# Patient Record
Sex: Female | Born: 1960
Health system: Southern US, Community
[De-identification: ages and names within clinical notes are randomized; demographics above are authoritative.]

## PROBLEM LIST (undated history)

## (undated) DIAGNOSIS — Z9484 Stem cells transplant status: Secondary | ICD-10-CM

## (undated) DIAGNOSIS — K5790 Diverticulosis of intestine, part unspecified, without perforation or abscess without bleeding: Secondary | ICD-10-CM

## (undated) DIAGNOSIS — I499 Cardiac arrhythmia, unspecified: Secondary | ICD-10-CM

## (undated) DIAGNOSIS — K219 Gastro-esophageal reflux disease without esophagitis: Secondary | ICD-10-CM

## (undated) DIAGNOSIS — E785 Hyperlipidemia, unspecified: Secondary | ICD-10-CM

## (undated) DIAGNOSIS — IMO0002 Reserved for concepts with insufficient information to code with codable children: Secondary | ICD-10-CM

## (undated) DIAGNOSIS — J45909 Unspecified asthma, uncomplicated: Secondary | ICD-10-CM

## (undated) DIAGNOSIS — M792 Neuralgia and neuritis, unspecified: Secondary | ICD-10-CM

## (undated) DIAGNOSIS — C801 Malignant (primary) neoplasm, unspecified: Secondary | ICD-10-CM

## (undated) DIAGNOSIS — Z87891 Personal history of nicotine dependence: Secondary | ICD-10-CM

## (undated) DIAGNOSIS — C8590 Non-Hodgkin lymphoma, unspecified, unspecified site: Secondary | ICD-10-CM

## (undated) DIAGNOSIS — J209 Acute bronchitis, unspecified: Secondary | ICD-10-CM

## (undated) DIAGNOSIS — R4020 Unspecified coma: Secondary | ICD-10-CM

## (undated) DIAGNOSIS — E119 Type 2 diabetes mellitus without complications: Secondary | ICD-10-CM

## (undated) DIAGNOSIS — K635 Polyp of colon: Secondary | ICD-10-CM

## (undated) DIAGNOSIS — J302 Other seasonal allergic rhinitis: Secondary | ICD-10-CM

## (undated) HISTORY — PX: PARTIAL HYSTERECTOMY: SHX80

## (undated) HISTORY — DX: Type 2 diabetes mellitus without complications: E11.9

## (undated) HISTORY — DX: Gastro-esophageal reflux disease without esophagitis: K21.9

## (undated) HISTORY — DX: Unspecified asthma, uncomplicated: J45.909

## (undated) HISTORY — PX: VAGINAL HYSTERECTOMY: SUR661

## (undated) HISTORY — DX: Hyperlipidemia, unspecified: E78.5

## (undated) HISTORY — PX: OTHER SURGICAL HISTORY: SHX169

---

## 2002-03-09 ENCOUNTER — Emergency Department (HOSPITAL_COMMUNITY): Admission: EM | Admit: 2002-03-09 | Discharge: 2002-03-09 | Payer: Self-pay

## 2002-03-21 ENCOUNTER — Ambulatory Visit (HOSPITAL_COMMUNITY): Admission: RE | Admit: 2002-03-21 | Discharge: 2002-03-21 | Payer: Self-pay | Admitting: Podiatry

## 2003-10-30 ENCOUNTER — Ambulatory Visit (HOSPITAL_COMMUNITY): Admission: RE | Admit: 2003-10-30 | Discharge: 2003-10-30 | Payer: Self-pay | Admitting: Internal Medicine

## 2004-02-11 ENCOUNTER — Emergency Department (HOSPITAL_COMMUNITY): Admission: EM | Admit: 2004-02-11 | Discharge: 2004-02-11 | Payer: Self-pay | Admitting: Emergency Medicine

## 2004-11-01 ENCOUNTER — Ambulatory Visit (HOSPITAL_COMMUNITY): Admission: RE | Admit: 2004-11-01 | Discharge: 2004-11-01 | Payer: Self-pay | Admitting: Internal Medicine

## 2005-02-05 ENCOUNTER — Emergency Department (HOSPITAL_COMMUNITY): Admission: EM | Admit: 2005-02-05 | Discharge: 2005-02-05 | Payer: Self-pay | Admitting: Emergency Medicine

## 2005-07-16 ENCOUNTER — Ambulatory Visit (HOSPITAL_COMMUNITY): Admission: RE | Admit: 2005-07-16 | Discharge: 2005-07-16 | Payer: Self-pay | Admitting: Internal Medicine

## 2006-10-16 ENCOUNTER — Ambulatory Visit (HOSPITAL_COMMUNITY): Admission: RE | Admit: 2006-10-16 | Discharge: 2006-10-16 | Payer: Self-pay | Admitting: Internal Medicine

## 2007-10-29 ENCOUNTER — Ambulatory Visit (HOSPITAL_COMMUNITY): Admission: RE | Admit: 2007-10-29 | Discharge: 2007-10-29 | Payer: Self-pay | Admitting: Internal Medicine

## 2008-11-28 ENCOUNTER — Ambulatory Visit (HOSPITAL_COMMUNITY): Admission: RE | Admit: 2008-11-28 | Discharge: 2008-11-28 | Payer: Self-pay | Admitting: Internal Medicine

## 2009-01-07 ENCOUNTER — Emergency Department (HOSPITAL_COMMUNITY): Admission: EM | Admit: 2009-01-07 | Discharge: 2009-01-08 | Payer: Self-pay | Admitting: Emergency Medicine

## 2009-07-20 ENCOUNTER — Emergency Department (HOSPITAL_COMMUNITY): Admission: EM | Admit: 2009-07-20 | Discharge: 2009-07-20 | Payer: Self-pay | Admitting: Emergency Medicine

## 2010-07-03 ENCOUNTER — Emergency Department (HOSPITAL_COMMUNITY): Admission: EM | Admit: 2010-07-03 | Discharge: 2010-07-03 | Payer: Self-pay | Admitting: Emergency Medicine

## 2010-11-04 ENCOUNTER — Encounter: Payer: Self-pay | Admitting: Orthopedic Surgery

## 2011-01-14 NOTE — Letter (Signed)
Summary: [facility] Referral No Show  [facility]  [address]  [place], [place] [address]   Phone: [phone]  Fax: [phone]    11/04/10  Dr. [name] [address] [place], [place]  [address]  Re:    Megan Mcknight DOB:    02/27/1961    The above named patient was a no show for the scheduled appointment:  Date:   10/31/2010 Time:     9:15 am  Thank you for your kind referral.  Please feel free to contact our office if we can be of further service.  Sincerely,   [facility] Office of Dr. Terrance Mass, MD

## 2011-02-15 ENCOUNTER — Emergency Department (HOSPITAL_COMMUNITY): Payer: PRIVATE HEALTH INSURANCE

## 2011-02-15 ENCOUNTER — Emergency Department (HOSPITAL_COMMUNITY)
Admission: EM | Admit: 2011-02-15 | Discharge: 2011-02-15 | Disposition: A | Discharge status: Home / Self Care | Payer: PRIVATE HEALTH INSURANCE | Attending: Emergency Medicine | Admitting: Emergency Medicine

## 2011-02-15 ENCOUNTER — Encounter: Payer: Self-pay | Admitting: Orthopedic Surgery

## 2011-02-15 DIAGNOSIS — M25579 Pain in unspecified ankle and joints of unspecified foot: Secondary | ICD-10-CM | POA: Insufficient documentation

## 2011-02-15 DIAGNOSIS — Y9269 Other specified industrial and construction area as the place of occurrence of the external cause: Secondary | ICD-10-CM | POA: Insufficient documentation

## 2011-02-15 DIAGNOSIS — IMO0002 Reserved for concepts with insufficient information to code with codable children: Secondary | ICD-10-CM | POA: Insufficient documentation

## 2011-02-15 DIAGNOSIS — S99929A Unspecified injury of unspecified foot, initial encounter: Secondary | ICD-10-CM | POA: Insufficient documentation

## 2011-02-15 DIAGNOSIS — S8990XA Unspecified injury of unspecified lower leg, initial encounter: Secondary | ICD-10-CM | POA: Insufficient documentation

## 2011-02-15 DIAGNOSIS — S99919A Unspecified injury of unspecified ankle, initial encounter: Secondary | ICD-10-CM | POA: Insufficient documentation

## 2011-02-24 ENCOUNTER — Encounter: Payer: Self-pay | Admitting: Orthopedic Surgery

## 2011-02-27 ENCOUNTER — Ambulatory Visit (INDEPENDENT_AMBULATORY_CARE_PROVIDER_SITE_OTHER): Payer: Worker's Compensation | Admitting: Orthopedic Surgery

## 2011-02-27 ENCOUNTER — Encounter: Payer: Self-pay | Admitting: Orthopedic Surgery

## 2011-02-27 DIAGNOSIS — S9030XA Contusion of unspecified foot, initial encounter: Secondary | ICD-10-CM

## 2011-02-27 DIAGNOSIS — S8010XA Contusion of unspecified lower leg, initial encounter: Secondary | ICD-10-CM

## 2011-03-04 NOTE — Assessment & Plan Note (Signed)
Summary: FOOT INJURY/POSS.NEW XRAY/HAD XRAY [facility] 02/15/11/REF T.[name]/W.C...   Visit Type:  new patient workers comp Referring Provider:  workers comp Primary Provider:  Dr. Felecia Shelling  CC:  left foot pain and lower leg.  History of Present Illness: I saw Megan Mcknight in the office today for an initial visit.  She is a 50 years old woman with the complaint of:  left foot pain and lower leg pain.  Workers Comp injury.  DOI 02/15/11 was at work, [employer].  Xrays [facility] 02/15/11 left foot.  Meds: Lipitor, Prevacid.  The patient works for [employer]  She's been working since her injury on February 15, 2011.  She was doing her normal job when a machine ran over her foot.  Since that time she's been in a postop shoe complaining of pain and swelling of her foot and lower leg.  She has been prescribed Naprosyn which she will pick up today.  She is complaining of a sharp burning pain.  Intensity 9/10.  Timing constant.  Improved by elevating her foot worsened by lowering the foot.  Associated signs include bruising swelling and symptoms of numbness and tingling of the foot.    Allergies (verified): 1)  ! 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 Cardiovascular:  Denies chest pain, palpitations, fainting, and murmurs. Respiratory:  Complains of snoring; denies short of breath, wheezing, couch, tightness, pain on inspiration, and snoring . Gastrointestinal:  Denies heartburn, nausea, vomiting, diarrhea, constipation, and blood in your stools. Genitourinary:  Denies  frequency, urgency, difficulty urinating, painful urination, flank pain, and bleeding in urine. Neurologic:  Complains of numbness and tingling; denies unsteady gait, dizziness, tremors, and seizure. Musculoskeletal:  Denies joint pain, swelling, instability, stiffness, redness, heat, and muscle pain. Endocrine:  Denies excessive thirst, exessive urination, and heat or cold intolerance. Psychiatric:  Denies nervousness, depression, anxiety, and hallucinations. Skin:  Denies changes in the skin, poor healing, rash, itching, and redness. HEENT:  Denies blurred or double vision, eye pain, redness, and watering. Immunology:  Complains of seasonal allergies; denies sinus problems and allergic to bee stings. Hemoatologic:  Denies easy bleeding and brusing.  Physical Exam  Additional Exam:  the vital signs are previously been recorded and are stable  She is well-developed and well-nourished her grooming and hygiene are normal.  She is oriented x3 her mood and affect are pleasant.  She is ambulating with a postop shoe with a noticeable limp favoring the LEFT lower extremity.  Upon inspection her foot is swollen in the distal third of her leg is swollen and there is an abrasion over the anterior part of the leg which is closed with no signs of infection.  Her range of motion at the ankle joint is approximately 15 of plantar flexion and 10 of dorsiflexion which is difficult.  The ankle joint is stable.  There is some weakness in plantarflexion dorsiflexion inversion and eversion but this is secondary to pain.  She is also tender in the anterior compartment of the tibia.  The compartments however are soft in the foot and the leg.  Skin is been described  She has  a normal pulse dorsalis pedis and posterior tib, temperature is normal.  There is swelling and edema in the leg.  Sensation is normal reflexes were deferred coordination and balance were normal.   Impression & Recommendations:  Problem # 1:   CONTUSION, LEFT FOOT (ICD-924.20) Assessment New  plan   long Cam Walker and Compression hose to control edema Self-directed home exercise program plantarflexion dorsiflexion 3 times a day 25 repetitions patient already instructed Walker to unload the foot  The patient can continue working if she can be accommodated for 100% sitting job she will followup on the 29th.  Orders: New Patient Level III (28413)  Problem # 2:  CONTUSION, LOWER LEG, LEFT (ICD-924.10) Assessment: New  Orders: New Patient Level III (24401)  Patient Instructions: 1)  RTW 100% SITTING  2)  WEAR WALKER TO WORK AND USE WALKER  3)  RETURN IN 2 WEEKS    Orders Added: 1)  New Patient Level III [02725]  Appended Document: FOOT INJURY/POSS.NEW XRAY/HAD XRAY [facility] 02/15/11/REF T.[name]/W.C..Marland Kitchen radiographs were done at the hospital here in town.  X-ray of the foot no evidence of fracture or dislocation.

## 2011-03-04 NOTE — Letter (Signed)
Summary: Out of Work  [facility]  [address]  [place], [place] [address]   Phone: [phone]  Fax: [phone]    February 27, 2011   Employee:  KRISTALYN BERGSTRESSER    To Whom It May Concern:   For Medical reasons, the above named patient may return to work 02/27/11 with the following restrictions:  must be sitting 100% of time until after her next appointment scheduled for  03/25/11.  If you need further information please contact our office.    Sincerely,      Dr. Vickki Hearing

## 2011-03-04 NOTE — Letter (Signed)
Summary: *Orthopedic Consult Note  [facility]  [address]  [place], [place] [address]   Phone: [phone]  Fax: [phone]    Re:    ASTER SCREWS DOB:    1961/06/17   Dear: Dr Felecia Shelling   Thank you for requesting that we see the above patient for consultation.  A copy of the detailed office note will be sent under separate cover, for your review.  Evaluation today is consistent with: contusion foot and leg  Recommendation for ambulatory Cam Walker, Walker, compression stockings, continue Naprosyn, continue working in a sit down position.  Followup 2 weeks    Thank you for this opportunity to look after your patient.  Sincerely,   Terrance Mass. MD.

## 2011-03-13 NOTE — Letter (Signed)
Summary: History form  History form   Imported By: Cammie Sickle 03/03/2011 22:10:59  _____________________________________________________________________  External Attachment:    Type:   Image     Comment:   External Document

## 2011-03-25 ENCOUNTER — Ambulatory Visit (INDEPENDENT_AMBULATORY_CARE_PROVIDER_SITE_OTHER): Payer: Worker's Compensation | Admitting: Orthopedic Surgery

## 2011-03-25 ENCOUNTER — Encounter: Payer: Self-pay | Admitting: Orthopedic Surgery

## 2011-03-25 DIAGNOSIS — S9030XA Contusion of unspecified foot, initial encounter: Secondary | ICD-10-CM

## 2011-03-25 NOTE — Patient Instructions (Signed)
Continue same treatment course and work status x 4 weeks

## 2011-03-25 NOTE — Progress Notes (Signed)
Followup status post deep contusion, LEFT foot.  Workers comp injury date 02/15/2011  Treatment compression stocking, protected weightbearing, and long Cam Walker, self-directed home exercise program. Sitting at work. She indicates that work has been complying with her sitting status.  She complains of numbness from the top of the foot to the distal part of her toes x5.  Her range of motion ankle has improved. She is still tender over the dorsum of the foot. Her swelling has decreased approximately 75% gross estimate.  Plan continue treatment plan x4 weeks followup 4 weeks. Continue work status. Same continue Cam Walker.  I expect resolution of the numbness over 6-9 months.

## 2011-03-31 ENCOUNTER — Encounter: Payer: Self-pay | Admitting: Orthopedic Surgery

## 2011-04-22 ENCOUNTER — Ambulatory Visit (INDEPENDENT_AMBULATORY_CARE_PROVIDER_SITE_OTHER): Payer: Worker's Compensation | Admitting: Orthopedic Surgery

## 2011-04-22 ENCOUNTER — Encounter: Payer: Self-pay | Admitting: Orthopedic Surgery

## 2011-04-22 DIAGNOSIS — S9030XA Contusion of unspecified foot, initial encounter: Secondary | ICD-10-CM

## 2011-04-22 NOTE — Patient Instructions (Signed)
2 hours of standing / 8 hour shift in the brace  And it will be 15 minutes consecutive time every hour  And continue the remaining time sitting   We will do this for 2 weeks and then increase the time to 30 min consecutive time for total of 4 hours / 8 hours for 2 more weeks then a follow up appointment with the doctor

## 2011-04-22 NOTE — Progress Notes (Signed)
This is a followup visit.  Worker's compensation injury.  Diagnosis contusion of the LEFT foot, secondary to heavy object falling on the LEFT foot.  No fracture was seen however, patient had significant swelling.  Complains of numbness and tingling in the dorsum of the foot and continued dorsal foot pain.  Date of injury was March 3 of this year.  Exam shows she is walking with a long Cam Walker. There is tenderness on the dorsum of the foot with decreased sensation in the middle digits. The tissue is firm and indurated. No sign of infection. Most likely secondary to the previous subcutaneous and muscular hematoma.  Recommended gradual return to standing at work starting with 15 minutes each hour. Can progress to 30 minutes each hour. We will start with 15 minutes per hour. The 1st 2 weeks and increased to 30 minutes per hour. The 2nd 2 weeks. She should continue her cam walker.  Followup in 4 weeks

## 2011-05-02 NOTE — H&P (Signed)
[facility]  Patient:    Megan Mcknight, Megan Mcknight Visit Number: [id number] MRN: [id number]          Service Type: EMS Location: ED Attending Physician:  Pearletha Alfred Dictated by:   Oley Balm Pricilla Holm, D.P.M. Admit Date:  03/09/2002 Discharge Date: 03/09/2002                           History and Physical  HISTORY OF PRESENT ILLNESS:  Ms. Megan Mcknight is a 50 year old black female who presents to the office with a chief complaint painful fifth toe of the left foot.  The patient has a recurrent lesion that developed on the dorsolateral aspect of the fifth toe.  She has tried to wear larger shoes, accommodative shoes, without significant relief from symptomatology and requests surgical correction of same.  PREVIOUS HOSPITALIZATIONS:  Childbirth x3, hysterectomy, previous foot surgery.  MEDICATIONS:  She takes Prevacid and Flonase and Lasix.  ALLERGIES:  She relates allergies to IODINE.  FAMILY HISTORY:  Heart disease.  TRANSFUSIONS:  No transfusion or hepatitis.  REVIEW OF SYSTEMS:  Uneventful.  PHYSICAL EXAMINATION:  EXTREMITIES:  Lower extremity exam reveals palpable pedal pulses, both DP and PT, with spontaneous capillary filling time.  NEUROLOGIC:  Essentially within normal limits.  MUSCULOSKELETAL:  Exam reveals abductovarus rotation of fifth toe of the left foot with associated digital lesion.  ASSESSMENT:  Abductovarus rotation, fifth toe, left foot, with a painful lesion.  PLAN:  The patient reviewed derotation arthroplasty.  We reviewed the procedure with the patient including complications of the procedure such as infection, bone infection, postoperative pain, swelling, etc.  The patient seems to understand the same and surgery has been scheduled for March 21, 2002. Dictated by:   Oley Balm Pricilla Holm, D.P.M. Attending Physician:  Susy Manor B DD:  03/20/02 TD:  03/21/02 Job: [id number]

## 2011-05-02 NOTE — Op Note (Signed)
[facility]  Patient:    NYKIA, TURKO Visit Number: [id number] MRN: [id number]          Service Type: DSU Location: DAY Attending Physician:  Abran Richard Dictated by:   Oley Balm Pricilla Holm, D.P.M. Proc. Date: 03/21/02 Admit Date:  03/21/2002                             Operative Report  PREOPERATIVE DIAGNOSIS:  Abductor varus rotation with hyperostosis, fifth toe, left foot.  POSTOPERATIVE DIAGNOSIS:  Abductor varus rotation with hyperostosis, fifth toe, left foot.  OPERATION:  Revision arthroplasty, fifth toe, left foot.  SURGEON:  Oley Balm. Pricilla Holm, D.P.M.  ANESTHESIA:  Local with standby  INDICATIONS:  Longstanding history of pain unrelieved by conservative care.  DESCRIPTION OF PROCEDURE:  The patient was brought to the operating room and placed on the operating table in supine position.  The patients lower left foot and leg were then prepped and draped in usual aseptic manner.  Ankle tourniquet placed, well padded to prevent contusion, inflated 250 mmHg.  After exsanguination of the left foot, the following surgical procedure was then performed under local with standby anesthesia.  REVISIONAL ARTHROPLASTY, FIFTH TOE, LEFT FOOT  Attention was directed to the dorsal lateral aspect of the fifth toe of the left foot where a 2 cm elliptical skin incision was made.  The incision was widened and deepened via sharp dissection making sure to identify and retract all vital structures.  A capsular incision was then made.  It was noted that it was very fibrotic over the junction over the proximal and distal phalanx.  A high parostosis was noted laterally which was freed from soft tissue attachments and resected utilizing a Zimmer oscillating saw.  All rough edges were smooth utilizing sterile saline.  The tendon approximated utilizing horizontal mattress suture of 4-0 Dexon.  The skin was approximated utilizing horizontal mattress sutures of 4-0  Prolene.  All surgical sites were then infiltrated with approximately 18 cc of ______ phosphate.  Then mild compressive bandage consisting of Betadine-soaked Adaptic, sterile 4 x 4s, and sterile cling were applied.  The patient tolerated the procedure well and left the operating room in apparently good condition.  Vital signs were stable in the recovery room. Dictated by:   Oley Balm Pricilla Holm, D.P.M. Attending Physician:  Abran Richard DD:  03/21/02 TD:  03/21/02 Job: [id number]

## 2011-05-21 ENCOUNTER — Encounter: Payer: Self-pay | Admitting: Orthopedic Surgery

## 2011-05-21 ENCOUNTER — Ambulatory Visit (INDEPENDENT_AMBULATORY_CARE_PROVIDER_SITE_OTHER): Payer: Worker's Compensation | Admitting: Orthopedic Surgery

## 2011-05-21 DIAGNOSIS — S9030XA Contusion of unspecified foot, initial encounter: Secondary | ICD-10-CM

## 2011-05-21 NOTE — Progress Notes (Signed)
Visit workers compensation injury LEFT foot contusion  Patient still has numbness in the foot the 4 lesser digits.  She still has pain on the top of the foot.  She has good pain relief in the [employer].  She is starting to have some pain running up the side of the leg which I believe is secondary to the gait alteration caused by the leg length discrepancy and wearing the Cam Walker.  She tolerated the work schedule as stated previously.  Exam shows he is numb over the lesser digits of the foot there is induration over the area of the contusion and there is tenderness in that area.  Her ankle range of motion is normal  Impression contusion LEFT foot  Plan switch to orthotics and change work schedule him to include one hour standing one-hour sitting alternate over to a weeks time and then come back for recheck and advance plan as tolerated at that point  I expect him MMI at one month from now

## 2011-05-21 NOTE — Patient Instructions (Signed)
[facility] orthotics Warm N Form   Try 1 hr stand 1 hr sit   In 8 hr work day x next 2 weeks   2 weeks come back

## 2011-06-05 ENCOUNTER — Encounter: Payer: Self-pay | Admitting: Orthopedic Surgery

## 2011-06-05 ENCOUNTER — Ambulatory Visit (INDEPENDENT_AMBULATORY_CARE_PROVIDER_SITE_OTHER): Payer: Worker's Compensation | Admitting: Orthopedic Surgery

## 2011-06-05 DIAGNOSIS — S9030XA Contusion of unspecified foot, initial encounter: Secondary | ICD-10-CM

## 2011-06-05 NOTE — Progress Notes (Signed)
Previous history  Visit workers compensation injury LEFT foot contusion  Patient still has numbness in the foot the 4 lesser digits. She still has pain on the top of the foot. She has good pain relief in the [employer]. She is starting to have some pain running up the side of the leg which I believe is secondary to the gait alteration caused by the leg length discrepancy and wearing the Cam Walker. She tolerated the work schedule as stated previously.    Today he is doing well with her new work restrictions I think we can advance him to 4 hours on 4 hours off with regular foot orthotics.  On her next visit we should be able to resume normal 8 hours on her feet during a shift.

## 2011-06-05 NOTE — Patient Instructions (Signed)
Wear orthotics   Change to 4 hours on 4 hours off feet 8 hour shift

## 2011-06-12 ENCOUNTER — Encounter: Payer: Self-pay | Admitting: Orthopedic Surgery

## 2011-06-12 ENCOUNTER — Ambulatory Visit (INDEPENDENT_AMBULATORY_CARE_PROVIDER_SITE_OTHER): Payer: Worker's Compensation | Admitting: Orthopedic Surgery

## 2011-06-12 DIAGNOSIS — S8010XA Contusion of unspecified lower leg, initial encounter: Secondary | ICD-10-CM

## 2011-06-12 DIAGNOSIS — S9030XA Contusion of unspecified foot, initial encounter: Secondary | ICD-10-CM

## 2011-06-12 NOTE — Progress Notes (Signed)
   The patient reports some pain with her new ambulatory regiment but she is progressing well I would like to keep her at this level so that we don't have a relapse.  Continue for 3 more weeks with 4 hours on 4 hours off over an 8 hour period sitting as needed which seems to be working well

## 2011-06-12 NOTE — Patient Instructions (Signed)
4 hours on 4 hours off in 8 hour shift sit as needed  with regular foot orthotics.

## 2011-06-24 ENCOUNTER — Telehealth: Payer: Self-pay | Admitting: Orthopedic Surgery

## 2011-06-24 NOTE — Telephone Encounter (Signed)
[employer] * ATTNFreddrick March [phone]    [facility] [phone]   06/24/11  06/24/11  Faxed last office notes (DOS 06/12/11) to [employer] (Workers Comp) as per request; reference/claim # [id number].  Authorization on file.

## 2011-07-09 ENCOUNTER — Encounter: Payer: Self-pay | Admitting: Orthopedic Surgery

## 2011-07-09 ENCOUNTER — Ambulatory Visit (INDEPENDENT_AMBULATORY_CARE_PROVIDER_SITE_OTHER): Payer: Worker's Compensation | Admitting: Orthopedic Surgery

## 2011-07-09 DIAGNOSIS — S9030XA Contusion of unspecified foot, initial encounter: Secondary | ICD-10-CM

## 2011-07-09 NOTE — Progress Notes (Signed)
Status post crush injury, LEFT foot, with nerve damage.  Still has numbness and tingling in the lesser digits, and great toe. The LEFT foot, with tenderness, and skin discoloration over the dorsum. Swelling is being controlled with a compression stocking. She is advancing very well in terms of walking and ambulating now with regular shoes and an orthotic. I think we can advance her to an 8 hour day with a 15 anesthetic. After standing one hour continuous and continue with orthotics, but no ladders, or climbing.  Followup 4 weeks

## 2011-07-09 NOTE — Patient Instructions (Signed)
Allowed to stand 8 hours as follows  Stand 1 hour consecutive followed by 15 min break   For the next 4 weeks  No climbing ladders

## 2011-08-06 ENCOUNTER — Ambulatory Visit (INDEPENDENT_AMBULATORY_CARE_PROVIDER_SITE_OTHER): Payer: Worker's Compensation | Admitting: Orthopedic Surgery

## 2011-08-06 ENCOUNTER — Encounter: Payer: Self-pay | Admitting: Orthopedic Surgery

## 2011-08-06 DIAGNOSIS — S8010XA Contusion of unspecified lower leg, initial encounter: Secondary | ICD-10-CM

## 2011-08-06 DIAGNOSIS — R209 Unspecified disturbances of skin sensation: Secondary | ICD-10-CM

## 2011-08-06 DIAGNOSIS — R208 Other disturbances of skin sensation: Secondary | ICD-10-CM

## 2011-08-06 DIAGNOSIS — S9030XA Contusion of unspecified foot, initial encounter: Secondary | ICD-10-CM

## 2011-08-06 NOTE — Progress Notes (Signed)
Status post crush injury, LEFT foot, with nerve damage.   Still has numbness and tingling in the lesser digits, and great toe. The LEFT foot, with tenderness, and skin discoloration over the dorsum. Swelling is being controlled with a compression stocking. She is advancing very well in terms of walking and ambulating now with regular shoes and an orthotic. I think we can advance her to an 8 hour day     Allowed to stand 8 hours as follows  Stand 1 hour consecutive followed by 15 min break  For the next 4 weeks  No climbing ladders  Complains of dysesthesias in the foot.  The patient will have a permanent partial impairment of her LEFT foot secondary to the nerve damage.  Recommend a 5% partial permanent impairment of the LEFT foot.  Advance weightbearing status as follows:  Stand 2 hours consecutive followed by 15 minute break for the next 4 weeks, then  Stand 3 hours consecutive followed by 15 minute break for weeks 5-8, then resume all normal activities.  Allow ladders in 4 weeks.  I believe the patient will have reached her maximal medical improvement in 8 weeks.  She will followup in 8 weeks to confirm the partial permanent impairment.

## 2011-08-06 NOTE — Patient Instructions (Signed)
Advance weightbearing status as follows:  Stand 2 hours consecutive followed by 15 minute break for the next 4 weeks, then  Stand 3 hours consecutive followed by 15 minute break for weeks 5-8, then resume all normal activities.  Allow ladders in 4 weeks.

## 2011-08-07 ENCOUNTER — Telehealth: Payer: Self-pay | Admitting: Orthopedic Surgery

## 2011-08-07 NOTE — Telephone Encounter (Signed)
Faxed office note to [employer], attention Crista Luria, to fax [phone], ph# [phone]. Authorization on file

## 2011-09-09 ENCOUNTER — Other Ambulatory Visit (HOSPITAL_COMMUNITY): Payer: Self-pay | Admitting: Internal Medicine

## 2011-09-09 DIAGNOSIS — Z139 Encounter for screening, unspecified: Secondary | ICD-10-CM

## 2011-09-15 ENCOUNTER — Ambulatory Visit (HOSPITAL_COMMUNITY)
Admission: RE | Admit: 2011-09-15 | Discharge: 2011-09-15 | Disposition: A | Discharge status: Home / Self Care | Payer: PRIVATE HEALTH INSURANCE | Source: Ambulatory Visit | Attending: Internal Medicine | Admitting: Internal Medicine

## 2011-09-15 DIAGNOSIS — Z1231 Encounter for screening mammogram for malignant neoplasm of breast: Secondary | ICD-10-CM | POA: Insufficient documentation

## 2011-09-15 DIAGNOSIS — Z139 Encounter for screening, unspecified: Secondary | ICD-10-CM

## 2011-10-01 ENCOUNTER — Ambulatory Visit: Payer: Worker's Compensation | Admitting: Orthopedic Surgery

## 2011-10-02 ENCOUNTER — Encounter: Payer: Self-pay | Admitting: Orthopedic Surgery

## 2011-10-02 ENCOUNTER — Ambulatory Visit (INDEPENDENT_AMBULATORY_CARE_PROVIDER_SITE_OTHER): Payer: Worker's Compensation | Admitting: Orthopedic Surgery

## 2011-10-02 VITALS — Ht 68.0 in | Wt 231.0 lb

## 2011-10-02 DIAGNOSIS — G5790 Unspecified mononeuropathy of unspecified lower limb: Secondary | ICD-10-CM

## 2011-10-02 DIAGNOSIS — S9032XA Contusion of left foot, initial encounter: Secondary | ICD-10-CM

## 2011-10-02 DIAGNOSIS — S9030XA Contusion of unspecified foot, initial encounter: Secondary | ICD-10-CM

## 2011-10-02 MED ORDER — GABAPENTIN 100 MG PO CAPS: 100.0000 mg | ORAL_CAPSULE | Freq: Every day | ORAL | Status: DC

## 2011-10-02 NOTE — Progress Notes (Signed)
  DOI 02/15/11:  Workers compensation injury: Crush injury to the left foot  Diagnoses: Contusion left foot  Diagnosis: Mononeuritis left foot  Chief complaint paresthesias in the left foot dorsally and plantar under the metatarsal heads. Continued pain on the dorsum of the left foot. Giving out of the left foot. Difficulty standing single leg stance left foot. The patient complains of inability uterine and exercise as she did prior to injury.  She feels that she can continue to work if she gets more breaks during the day.  Although its difficult to assess the exact amount of impairment, a reasonable amount of impairment based on the pain and continued paresthesias in the left foot in my medical opinion would be approximately 10%  Our permanent partial impairment rating is therefore: PPI 10% left foot Based on continued pain dorsum of the foot with probable nerve injury from crush mechanism  This will most likely be permanent and will require intermittent medication as needed which may or may not include oral pain medication, anti-inflammatories or and Neurontin/Lyrica

## 2011-10-02 NOTE — Patient Instructions (Addendum)
Workers  Per workers Adult nurse for the state of [place] we are required to give you a permanent partial impairment if one exists.  Based on your pain and abnormal sensation in the dorsum of your foot, a 10% permanent partial impairment has been assigned to your LEFT foot injury.  This is most likely a permanent impairment.   work restrictions have been assigned : (6)20 minute breaks per 8 hour shift   will need permanent TED hose with renewals as needed     recommend custom foot orthotic (arrange with the podiatrist of your choice)    I would like for you to try neurontin 100 mg at night to help with the numbness

## 2011-10-03 ENCOUNTER — Encounter: Payer: Self-pay | Admitting: Orthopedic Surgery

## 2011-10-03 DIAGNOSIS — G5790 Unspecified mononeuropathy of unspecified lower limb: Secondary | ICD-10-CM | POA: Insufficient documentation

## 2011-10-03 DIAGNOSIS — S9032XA Contusion of left foot, initial encounter: Secondary | ICD-10-CM | POA: Insufficient documentation

## 2011-10-13 ENCOUNTER — Telehealth: Payer: Self-pay | Admitting: Orthopedic Surgery

## 2011-10-13 NOTE — Telephone Encounter (Addendum)
Patient has copy + will bring in as soon as possible to be faxed as noted.

## 2011-10-13 NOTE — Telephone Encounter (Signed)
The patient has the script I believe

## 2011-10-13 NOTE — Telephone Encounter (Signed)
Call from [facility], Clydie Braun, on behalf of [employer] (workers comp.) Request Rx for custom orthotic for left foot per office visit 10/09/11. Their fax is [phone], ph# [phone].  Upon receipt of Rx, they will refer patient to approved vendor.

## 2011-10-15 NOTE — Telephone Encounter (Addendum)
10/15/11  Patient brought in an Rx however it was copy of the Rx for Ted hose/compression stockings, not brace.  Dr. Mort Sawyers note from [facility] 10/02/11 indicates custom orthotic brace with podiatrist of choice.  The calls we are receiving are from [facility], the DME service provider, requesting the custom measurements.  I contacted patient's case manager, Helmut Muster at ph# [phone] - she will follow up and take care of.  Patient aware.

## 2011-10-22 NOTE — Telephone Encounter (Signed)
I called back to [place] at [employer] workers [employer] to follow up on status of brace/orthotics. Left voice mail message.

## 2011-10-27 NOTE — Telephone Encounter (Signed)
No response, left a fol/up voice mail message + sent fax to check on status of orthotic and Ted hose.  Patient has not heard; I left her a message to let her know we are following up.

## 2011-10-29 NOTE — Telephone Encounter (Addendum)
Patient states she received call from [employer] Workers comp case Production designer, [employer]television[employer] [place] on 10/27/11 and also has heard from the orthotic vendor.  She has an appointment scheduled that workers comp set up, at [facility] in [place].  Ph# [phone], Fax [phone].

## 2011-10-30 ENCOUNTER — Telehealth: Payer: Self-pay | Admitting: Orthopedic Surgery

## 2011-10-30 NOTE — Telephone Encounter (Signed)
As noted in previous telephone note, patient has an appointment, per Workers Comp, scheduled at [facility] in [place] for Monday, 11/03/11. They will provide the Ted hose, which patient has the prescription for, and also will measure her for the orthotics. Is there an order or Rx to send to them?  Their ph# is [phone], fax [phone], contact person is Amil Amen.

## 2012-01-23 ENCOUNTER — Telehealth: Payer: Self-pay

## 2012-01-23 NOTE — Telephone Encounter (Signed)
LM for pt to call

## 2012-01-27 ENCOUNTER — Telehealth: Payer: Self-pay

## 2012-01-27 NOTE — Telephone Encounter (Signed)
Opened in error

## 2012-01-27 NOTE — Telephone Encounter (Signed)
Pt returned call. Has problems with constipation. OV with Tana Coast, PA on 02/02/2012 @ 8:00 AM.

## 2012-02-02 ENCOUNTER — Encounter: Payer: Self-pay | Admitting: Gastroenterology

## 2012-02-02 ENCOUNTER — Ambulatory Visit (INDEPENDENT_AMBULATORY_CARE_PROVIDER_SITE_OTHER): Payer: PRIVATE HEALTH INSURANCE | Admitting: Gastroenterology

## 2012-02-02 VITALS — BP 130/83 | HR 74 | Temp 98.1°F | Ht 68.0 in | Wt 230.8 lb

## 2012-02-02 DIAGNOSIS — Z8371 Family history of colonic polyps: Secondary | ICD-10-CM | POA: Insufficient documentation

## 2012-02-02 DIAGNOSIS — K5900 Constipation, unspecified: Secondary | ICD-10-CM | POA: Insufficient documentation

## 2012-02-02 DIAGNOSIS — K219 Gastro-esophageal reflux disease without esophagitis: Secondary | ICD-10-CM | POA: Insufficient documentation

## 2012-02-02 DIAGNOSIS — Z1211 Encounter for screening for malignant neoplasm of colon: Secondary | ICD-10-CM

## 2012-02-02 MED ORDER — PEG-KCL-NACL-NASULF-NA ASC-C 100 G PO SOLR: 1.0000 | Freq: Once | ORAL | Status: DC

## 2012-02-02 MED ORDER — POLYETHYLENE GLYCOL 3350 17 GM/SCOOP PO POWD: 17.0000 g | Freq: Every day | ORAL | Status: AC

## 2012-02-02 NOTE — Assessment & Plan Note (Signed)
Seldom GERD controlled with antireflux measures.

## 2012-02-02 NOTE — Assessment & Plan Note (Signed)
Chronic constipation. Add Miralax 17g daily. Patient with family history of colon polyps (mother, details unknown). No prior TCS herself.  I have discussed the risks, alternatives, benefits with regards to but not limited to the risk of reaction to medication, bleeding, infection, perforation and the patient is agreeable to proceed. Written consent to be obtained.

## 2012-02-02 NOTE — Progress Notes (Signed)
Addended by: Cherene Julian D on: 02/02/2012 09:28 AM   Modules accepted: Orders

## 2012-02-02 NOTE — Patient Instructions (Signed)
I have sent RX for Miralax to your pharmacy. Check to see if your insurance will pay for generic. They may make you buy over-the-counter. We have scheduled you for a colonoscopy. Please see separate instructions.

## 2012-02-02 NOTE — Progress Notes (Signed)
Primary Care Physician:  [name], MD, MD  Primary Gastroenterologist:  Roetta Sessions, MD   Chief Complaint  Patient presents with  . Colonoscopy    HPI:  Megan Mcknight is a 51 y.o. female here to schedule her first ever colonoscopy. Mother recently had colon polyps but patient not aware of details. Patient c/o chronic constipation. BM 2-3 per week. Takes Philips MOM pills prn.  No melena, brbpr. No abdominal pain, n/v. Some seldom heartburn. Watches diet. No dysphagia. No weight loss. Takes neurontin and naproxen for left foot pain due to work related injury. Crush injury to left foot/leg.  Current Outpatient Prescriptions  Medication Sig Dispense Refill  . atorvastatin (LIPITOR) 10 MG tablet Take 10 mg by mouth daily.        Marland Kitchen gabapentin (NEURONTIN) 100 MG capsule Take 1 capsule (100 mg total) by mouth at bedtime.  90 capsule  2  . naproxen (NAPROSYN) 500 MG tablet Take 500 mg by mouth daily.          Allergies as of 02/02/2012 - Review Complete 02/02/2012  Allergen Reaction Noted  . Iodine Anaphylaxis   . Shellfish-derived products Swelling 03/25/2011    Past Medical History  Diagnosis Date  . Hyperlipidemia     Past Surgical History  Procedure Date  . Partial hysterectomy   . Bones removed from baby toes     Family History  Problem Relation Age of Onset  . Heart defect      family history   . Asthma      family history   . Colon polyps Mother     History   Social History  . Marital Status: Divorced    Spouse Name: N/A    Number of Children: 3  . Years of Education: N/A   Occupational History  . technician    .     Social History Main Topics  . Smoking status: Current Everyday Smoker -- 0.5 packs/day    Types: Cigarettes  . Smokeless tobacco: Not on file  . Alcohol Use: Yes     one glass every three weeks  . Drug Use: No  . Sexually Active: Not on file   Other Topics Concern  . Not on file   Social History Narrative  . No narrative on  file      ROS:  General: Negative for anorexia, weight loss, fever, chills, fatigue, weakness. Eyes: Negative for vision changes.  ENT: Negative for hoarseness, difficulty swallowing , nasal congestion. CV: Negative for chest pain, angina, palpitations, dyspnea on exertion, peripheral edema.  Respiratory: Negative for dyspnea at rest, dyspnea on exertion, cough, sputum, wheezing.  GI: See history of present illness. GU:  Negative for dysuria, hematuria, urinary incontinence, urinary frequency, nocturnal urination.  MS: Left foot pain and numbness. No low back pain.  Derm: Negative for rash or itching.  Neuro: Negative for weakness, abnormal sensation, seizure, frequent headaches, memory loss, confusion.  Psych: Negative for anxiety, depression, suicidal ideation, hallucinations.  Endo: Negative for unusual weight change.  Heme: Negative for bruising or bleeding. Allergy: Negative for rash or hives.    Physical Examination:  BP 130/83  Pulse 74  Temp(Src) 98.1 F (36.7 C) (Temporal)  Ht 5\' 8"  (1.727 m)  Wt 230 lb 12.8 oz (104.69 kg)  BMI 35.09 kg/m2   General: Well-nourished, well-developed in no acute distress.  Head: Normocephalic, atraumatic.   Eyes: Conjunctiva pink, no icterus. Mouth: Oropharyngeal mucosa moist and pink , no lesions erythema or exudate.  Neck: Supple without thyromegaly, masses, or lymphadenopathy.  Lungs: Clear to auscultation bilaterally.  Heart: Regular rate and rhythm, no murmurs rubs or gallops.  Abdomen: Bowel sounds are normal, nontender, nondistended, no hepatosplenomegaly or masses, no abdominal bruits or    hernia , no rebound or guarding.   Rectal: Defer to time of colonoscopy. Extremities: No lower extremity edema. No clubbing or deformities.  Neuro: Alert and oriented x 4 , grossly normal neurologically.  Skin: Warm and dry, no rash or jaundice.   Psych: Alert and cooperative, normal mood and affect.

## 2012-02-02 NOTE — Progress Notes (Signed)
Faxed to PCP

## 2012-02-13 ENCOUNTER — Telehealth: Payer: Self-pay | Admitting: Gastroenterology

## 2012-02-13 MED ORDER — SODIUM CHLORIDE 0.45 % IV SOLN
Freq: Once | INTRAVENOUS | Status: AC
  Administered 2012-02-16: 13:00:00 via INTRAVENOUS

## 2012-02-13 NOTE — Telephone Encounter (Signed)
Left message with gentleman with pts new arrival and procedure time- she will need to check in at 12:00

## 2012-02-16 ENCOUNTER — Ambulatory Visit (HOSPITAL_COMMUNITY)
Admission: RE | Admit: 2012-02-16 | Discharge: 2012-02-16 | Disposition: A | Discharge status: Home / Self Care | Payer: PRIVATE HEALTH INSURANCE | Source: Ambulatory Visit | Attending: Internal Medicine | Admitting: Internal Medicine

## 2012-02-16 ENCOUNTER — Encounter (HOSPITAL_COMMUNITY): Payer: Self-pay | Admitting: *Deleted

## 2012-02-16 ENCOUNTER — Encounter (HOSPITAL_COMMUNITY)
Admission: RE | Disposition: A | Discharge status: Home / Self Care | Payer: Self-pay | Source: Ambulatory Visit | Attending: Internal Medicine

## 2012-02-16 DIAGNOSIS — D129 Benign neoplasm of anus and anal canal: Secondary | ICD-10-CM

## 2012-02-16 DIAGNOSIS — Z8371 Family history of colonic polyps: Secondary | ICD-10-CM

## 2012-02-16 DIAGNOSIS — Z1211 Encounter for screening for malignant neoplasm of colon: Secondary | ICD-10-CM | POA: Insufficient documentation

## 2012-02-16 DIAGNOSIS — D126 Benign neoplasm of colon, unspecified: Secondary | ICD-10-CM | POA: Insufficient documentation

## 2012-02-16 DIAGNOSIS — D128 Benign neoplasm of rectum: Secondary | ICD-10-CM

## 2012-02-16 DIAGNOSIS — K5730 Diverticulosis of large intestine without perforation or abscess without bleeding: Secondary | ICD-10-CM | POA: Insufficient documentation

## 2012-02-16 DIAGNOSIS — E785 Hyperlipidemia, unspecified: Secondary | ICD-10-CM | POA: Insufficient documentation

## 2012-02-16 DIAGNOSIS — Z800 Family history of malignant neoplasm of digestive organs: Secondary | ICD-10-CM | POA: Insufficient documentation

## 2012-02-16 HISTORY — PX: COLONOSCOPY: SHX5424

## 2012-02-16 HISTORY — DX: Neuralgia and neuritis, unspecified: M79.2

## 2012-02-16 SURGERY — COLONOSCOPY
Anesthesia: Moderate Sedation

## 2012-02-16 MED ORDER — STERILE WATER FOR IRRIGATION IR SOLN
Status: DC | PRN
  Administered 2012-02-16: 14:00:00

## 2012-02-16 MED ORDER — MEPERIDINE HCL 100 MG/ML IJ SOLN
INTRAMUSCULAR | Status: AC
  Filled 2012-02-16: qty 2

## 2012-02-16 MED ORDER — MIDAZOLAM HCL 5 MG/5ML IJ SOLN
INTRAMUSCULAR | Status: DC | PRN
  Administered 2012-02-16 (×2): 2 mg via INTRAVENOUS
  Administered 2012-02-16: 1 mg via INTRAVENOUS

## 2012-02-16 MED ORDER — MIDAZOLAM HCL 5 MG/5ML IJ SOLN
INTRAMUSCULAR | Status: AC
  Filled 2012-02-16: qty 10

## 2012-02-16 MED ORDER — MEPERIDINE HCL 100 MG/ML IJ SOLN
INTRAMUSCULAR | Status: DC | PRN
  Administered 2012-02-16: 25 mg via INTRAVENOUS
  Administered 2012-02-16: 50 mg via INTRAVENOUS
  Administered 2012-02-16: 25 mg via INTRAVENOUS

## 2012-02-16 NOTE — H&P (View-Only) (Signed)
Primary Care Physician:  [name],[name], MD, MD  Primary Gastroenterologist:  Michael Rourk, MD   Chief Complaint  Patient presents with  . Colonoscopy    HPI:  Megan Mcknight is a 51 y.o. female here to schedule her first ever colonoscopy. Mother recently had colon polyps but patient not aware of details. Patient c/o chronic constipation. BM 2-3 per week. Takes Philips MOM pills prn.  No melena, brbpr. No abdominal pain, n/v. Some seldom heartburn. Watches diet. No dysphagia. No weight loss. Takes neurontin and naproxen for left foot pain due to work related injury. Crush injury to left foot/leg.  Current Outpatient Prescriptions  Medication Sig Dispense Refill  . atorvastatin (LIPITOR) 10 MG tablet Take 10 mg by mouth daily.        . gabapentin (NEURONTIN) 100 MG capsule Take 1 capsule (100 mg total) by mouth at bedtime.  90 capsule  2  . naproxen (NAPROSYN) 500 MG tablet Take 500 mg by mouth daily.          Allergies as of 02/02/2012 - Review Complete 02/02/2012  Allergen Reaction Noted  . Iodine Anaphylaxis   . Shellfish-derived products Swelling 03/25/2011    Past Medical History  Diagnosis Date  . Hyperlipidemia     Past Surgical History  Procedure Date  . Partial hysterectomy   . Bones removed from baby toes     Family History  Problem Relation Age of Onset  . Heart defect      family history   . Asthma      family history   . Colon polyps Mother     History   Social History  . Marital Status: Divorced    Spouse Name: N/A    Number of Children: 3  . Years of Education: N/A   Occupational History  . technician    .     Social History Main Topics  . Smoking status: Current Everyday Smoker -- 0.5 packs/day    Types: Cigarettes  . Smokeless tobacco: Not on file  . Alcohol Use: Yes     one glass every three weeks  . Drug Use: No  . Sexually Active: Not on file   Other Topics Concern  . Not on file   Social History Narrative  . No narrative on  file      ROS:  General: Negative for anorexia, weight loss, fever, chills, fatigue, weakness. Eyes: Negative for vision changes.  ENT: Negative for hoarseness, difficulty swallowing , nasal congestion. CV: Negative for chest pain, angina, palpitations, dyspnea on exertion, peripheral edema.  Respiratory: Negative for dyspnea at rest, dyspnea on exertion, cough, sputum, wheezing.  GI: See history of present illness. GU:  Negative for dysuria, hematuria, urinary incontinence, urinary frequency, nocturnal urination.  MS: Left foot pain and numbness. No low back pain.  Derm: Negative for rash or itching.  Neuro: Negative for weakness, abnormal sensation, seizure, frequent headaches, memory loss, confusion.  Psych: Negative for anxiety, depression, suicidal ideation, hallucinations.  Endo: Negative for unusual weight change.  Heme: Negative for bruising or bleeding. Allergy: Negative for rash or hives.    Physical Examination:  BP 130/83  Pulse 74  Temp(Src) 98.1 F (36.7 C) (Temporal)  Ht 5' 8" (1.727 m)  Wt 230 lb 12.8 oz (104.69 kg)  BMI 35.09 kg/m2   General: Well-nourished, well-developed in no acute distress.  Head: Normocephalic, atraumatic.   Eyes: Conjunctiva pink, no icterus. Mouth: Oropharyngeal mucosa moist and pink , no lesions erythema or exudate.   Neck: Supple without thyromegaly, masses, or lymphadenopathy.  Lungs: Clear to auscultation bilaterally.  Heart: Regular rate and rhythm, no murmurs rubs or gallops.  Abdomen: Bowel sounds are normal, nontender, nondistended, no hepatosplenomegaly or masses, no abdominal bruits or    hernia , no rebound or guarding.   Rectal: Defer to time of colonoscopy. Extremities: No lower extremity edema. No clubbing or deformities.  Neuro: Alert and oriented x 4 , grossly normal neurologically.  Skin: Warm and dry, no rash or jaundice.   Psych: Alert and cooperative, normal mood and affect.     

## 2012-02-16 NOTE — Op Note (Addendum)
[facility] [address] [place], [place]  [address]  COLONOSCOPY PROCEDURE REPORT  PATIENT:  Megan Mcknight, Megan Mcknight  MR#:  [id number] BIRTHDATE:  Dec 07, 1961, 50 yrs. old  GENDER:  female ENDOSCOPIST:  R. Roetta Sessions, MD FACP [facility] REF. BY:  Glenice Laine, M.D. PROCEDURE DATE:  02/16/2012 PROCEDURE:  Colonoscopy with snare polypectomy and polyp ablation  INDICATIONS:  Screening colonoscopy/positive family history of polyps in first-degree relatives.  INFORMED CONSENT:  The risks, benefits, alternatives and imponderables including but not limited to bleeding, perforation as well as the possibility of a missed lesion have been reviewed. The potential for biopsy, lesion removal, etc. have also been discussed.  Questions have been answered.  All parties agreeable. Please see the history and physical in the medical record for more information.  MEDICATIONS:  Versed 5 mg IV and Demerol 100 mg IV in divided doses.  DESCRIPTION OF PROCEDURE:  After a digital rectal exam was performed, the EC-[id number] ([id number]) colonoscope was advanced from the anus through the rectum and colon to the area of the cecum, ileocecal valve and appendiceal orifice.  The cecum was deeply intubated.  These structures were well-seen and photographed for the record.  From the level of the cecum and ileocecal valve, the scope was slowly and cautiously withdrawn.  The mucosal surfaces were carefully surveyed utilizing scope tip deflection to facilitate fold flattening as needed.  The scope was pulled down into the rectum where a thorough examination including retroflexion was performed. <<PROCEDUREIMAGES>>  FINDINGS: Adequate preparation. Normal rectum. Flat 1 x 1 cm adenomatous appearing polyp in the mid descending segment. Multiple diminutive sigmoid and rectosigmoid polyps ablated with the tip of the hot snare loop. Sigmoid diverticula; remainder colonic mucosa appeared normal. The larger descending colon  polyp was removed one pass with hot snare cautery and  COMPLICATIONS:  None  CECAL WITHDRAWAL TIME: 17 minutes  IMPRESSION:  Colonic polyps-treated as described above. Sigmoid diverticulosis  RECOMMENDATIONS: Follow up on pathology  ______________________________ R. Roetta Sessions, MD Caleen Essex  CC:  Glenice Laine, M.D.  n. eSIGNED:   R. Roetta Sessions at 02/16/2012 02:44 PM  Aliene Beams, [phone]

## 2012-02-16 NOTE — Interval H&P Note (Signed)
History and Physical Interval Note:  02/16/2012 2:10 PM  Davia L Rolly Salter  has presented today for surgery, with the diagnosis of screening  The various methods of treatment have been discussed with the patient and family. After consideration of risks, benefits and other options for treatment, the patient has consented to  Procedure(s) (LRB): COLONOSCOPY (N/A) as a surgical intervention .  The patients' history has been reviewed, patient examined, no change in status, stable for surgery.  I have reviewed the patients' chart and labs.  Questions were answered to the patient's satisfaction.     Eula Listen

## 2012-02-20 ENCOUNTER — Encounter (HOSPITAL_COMMUNITY): Payer: Self-pay | Admitting: Internal Medicine

## 2012-02-29 ENCOUNTER — Encounter: Payer: Self-pay | Admitting: Internal Medicine

## 2012-10-01 ENCOUNTER — Other Ambulatory Visit (HOSPITAL_COMMUNITY): Payer: Self-pay | Admitting: Internal Medicine

## 2012-10-01 DIAGNOSIS — Z139 Encounter for screening, unspecified: Secondary | ICD-10-CM

## 2012-10-05 ENCOUNTER — Ambulatory Visit (HOSPITAL_COMMUNITY)
Admission: RE | Admit: 2012-10-05 | Discharge: 2012-10-05 | Disposition: A | Discharge status: Home / Self Care | Payer: PRIVATE HEALTH INSURANCE | Source: Ambulatory Visit | Attending: Internal Medicine | Admitting: Internal Medicine

## 2012-10-05 DIAGNOSIS — Z1231 Encounter for screening mammogram for malignant neoplasm of breast: Secondary | ICD-10-CM | POA: Insufficient documentation

## 2012-10-05 DIAGNOSIS — Z139 Encounter for screening, unspecified: Secondary | ICD-10-CM

## 2012-12-12 DIAGNOSIS — J029 Acute pharyngitis, unspecified: Secondary | ICD-10-CM | POA: Insufficient documentation

## 2012-12-12 DIAGNOSIS — R059 Cough, unspecified: Secondary | ICD-10-CM | POA: Insufficient documentation

## 2012-12-12 DIAGNOSIS — R110 Nausea: Secondary | ICD-10-CM | POA: Insufficient documentation

## 2012-12-12 DIAGNOSIS — B9789 Other viral agents as the cause of diseases classified elsewhere: Secondary | ICD-10-CM | POA: Insufficient documentation

## 2012-12-12 DIAGNOSIS — F17200 Nicotine dependence, unspecified, uncomplicated: Secondary | ICD-10-CM | POA: Insufficient documentation

## 2012-12-12 DIAGNOSIS — Z79899 Other long term (current) drug therapy: Secondary | ICD-10-CM | POA: Insufficient documentation

## 2012-12-12 DIAGNOSIS — E785 Hyperlipidemia, unspecified: Secondary | ICD-10-CM | POA: Insufficient documentation

## 2012-12-12 DIAGNOSIS — IMO0001 Reserved for inherently not codable concepts without codable children: Secondary | ICD-10-CM | POA: Insufficient documentation

## 2012-12-12 DIAGNOSIS — R05 Cough: Secondary | ICD-10-CM | POA: Insufficient documentation

## 2012-12-12 NOTE — ED Notes (Signed)
Pt states was getting over a cold & then it came back. Pt complains of generalized body pain.

## 2012-12-13 ENCOUNTER — Emergency Department (HOSPITAL_COMMUNITY)
Admission: EM | Admit: 2012-12-13 | Discharge: 2012-12-13 | Disposition: A | Discharge status: Home / Self Care | Payer: PRIVATE HEALTH INSURANCE | Attending: Emergency Medicine | Admitting: Emergency Medicine

## 2012-12-13 ENCOUNTER — Encounter (HOSPITAL_COMMUNITY): Payer: Self-pay | Admitting: *Deleted

## 2012-12-13 DIAGNOSIS — B349 Viral infection, unspecified: Secondary | ICD-10-CM

## 2012-12-13 MED ORDER — ACETAMINOPHEN 500 MG PO TABS
1000.0000 mg | ORAL_TABLET | Freq: Once | ORAL | Status: AC
  Administered 2012-12-13: 1000 mg via ORAL
  Filled 2012-12-13: qty 2

## 2012-12-13 MED ORDER — IBUPROFEN 800 MG PO TABS
800.0000 mg | ORAL_TABLET | Freq: Once | ORAL | Status: AC
  Administered 2012-12-13: 800 mg via ORAL
  Filled 2012-12-13: qty 1

## 2012-12-13 MED ORDER — GUAIFENESIN-CODEINE 100-10 MG/5ML PO SOLN
5.0000 mL | Freq: Once | ORAL | Status: AC
  Administered 2012-12-13: 5 mL via ORAL
  Filled 2012-12-13: qty 5

## 2012-12-13 NOTE — ED Provider Notes (Signed)
History     CSN: [id number]  Arrival date & time 12/12/12  2346   First MD Initiated Contact with Patient 12/13/12 [phone]      Chief Complaint  Patient presents with  . URI  . Headache  . Generalized Body Aches    (Consider location/radiation/quality/duration/timing/severity/associated sxs/prior treatment) HPI Megan Mcknight is a 51 y.o. female who presents to the Emergency Department complaining of cough, mucles aches, slightly sore throat, headache, mild nausea. She had a cold for two weeks and was getting better when all of the symptoms started again yesterday this time associated with body aches. She does not know if she was running a fever. Has taken no medicines.    Past Medical History  Diagnosis Date  . Hyperlipidemia   . Nerve pain     Past Surgical History  Procedure Date  . Partial hysterectomy   . Bones removed from baby toes   . Colonoscopy 02/16/2012    Procedure: COLONOSCOPY;  Surgeon: Corbin Ade, MD;  Location: [facility];  Service: Endoscopy;  Laterality: N/A;  1:30  . Abdominal hysterectomy     Family History  Problem Relation Age of Onset  . Heart defect      family history   . Asthma      family history   . Colon polyps Mother     History  Substance Use Topics  . Smoking status: Current Every Day Smoker -- 0.5 packs/day    Types: Cigarettes  . Smokeless tobacco: Not on file  . Alcohol Use: Yes     Comment: one glass every three weeks    OB History    Grav Para Term Preterm Abortions TAB SAB Ect Mult Living                  Review of Systems  Constitutional: Negative for fever.       10 Systems reviewed and are negative for acute change except as noted in the HPI.  HENT: Positive for sore throat. Negative for congestion.   Eyes: Negative for discharge and redness.  Respiratory: Positive for cough. Negative for shortness of breath.   Cardiovascular: Negative for chest pain.  Gastrointestinal: Positive for nausea. Negative for  vomiting and abdominal pain.  Musculoskeletal: Positive for myalgias. Negative for back pain.  Skin: Negative for rash.  Neurological: Positive for headaches. Negative for syncope and numbness.  Psychiatric/Behavioral:       No behavior change.    Allergies  Iodine and Shellfish-derived products  Home Medications   Current Outpatient Rx  Name  Route  Sig  Dispense  Refill  . MONTELUKAST SODIUM 10 MG PO TABS   Oral   Take 10 mg by mouth at bedtime.         Marland Kitchen ROSUVASTATIN CALCIUM 10 MG PO TABS   Oral   Take 10 mg by mouth daily.         . ATORVASTATIN CALCIUM 10 MG PO TABS   Oral   Take 10 mg by mouth daily.           Marland Kitchen GABAPENTIN 100 MG PO CAPS   Oral   Take 1 capsule (100 mg total) by mouth at bedtime.   90 capsule   2     May increase up to 3 tabs at night   . NAPROXEN 500 MG PO TABS   Oral   Take 500 mg by mouth daily.           Marland Kitchen  PEG-KCL-NACL-NASULF-NA ASC-C 100 G PO SOLR   Oral   Take 1 kit (100 g total) by mouth once. As directed Please purchase 1 Fleets enema to use with the prep   1 kit   0     BP 104/88  Pulse 88  Temp 98.7 F (37.1 C) (Oral)  Resp 20  Ht 5\' 8"  (1.727 m)  Wt 230 lb (104.327 kg)  BMI 34.97 kg/m2  SpO2 99%  Physical Exam  Nursing note and vitals reviewed. Constitutional: She appears well-developed and well-nourished.       Awake, alert, nontoxic appearance.  HENT:  Head: Normocephalic and atraumatic.  Right Ear: External ear normal.  Left Ear: External ear normal.  Mouth/Throat: Oropharynx is clear and moist.  Eyes: EOM are normal. Pupils are equal, round, and reactive to light. Right eye exhibits no discharge. Left eye exhibits no discharge.  Neck: Neck supple.  Cardiovascular: Normal heart sounds.   Pulmonary/Chest: Effort normal. She exhibits no tenderness.  Abdominal: Soft. There is no tenderness. There is no rebound.  Musculoskeletal: She exhibits no tenderness.       Baseline ROM, no obvious new focal  weakness.  Neurological:       Mental status and motor strength appears baseline for patient and situation.  Skin: No rash noted.  Psychiatric: She has a normal mood and affect.    ED Course  Procedures (including critical care time)      MDM  Patient presents with flu like symptoms. Given tylenol, ibuprofen and robitussin ac. Pt stable in ED with no significant deterioration in condition.The patient appears reasonably screened and/or stabilized for discharge and I doubt any other medical condition or other [facility] requiring further screening, evaluation, or treatment in the ED at this time prior to discharge.  MDM Reviewed: nursing note and vitals           Nicoletta Dress. Colon Branch, MD 12/13/12 [phone]

## 2012-12-13 NOTE — ED Notes (Signed)
Pt states flu like symptoms, states she had a cold that went away and came back, notes generalized body aches, cough, chills and a headache. Pt aax4. States cough non productive. Notes asthma hx but lungs clear.

## 2012-12-15 DIAGNOSIS — IMO0002 Reserved for concepts with insufficient information to code with codable children: Secondary | ICD-10-CM

## 2012-12-15 HISTORY — DX: Reserved for concepts with insufficient information to code with codable children: IMO0002

## 2013-02-12 DIAGNOSIS — K5790 Diverticulosis of intestine, part unspecified, without perforation or abscess without bleeding: Secondary | ICD-10-CM

## 2013-02-12 DIAGNOSIS — K635 Polyp of colon: Secondary | ICD-10-CM

## 2013-02-12 HISTORY — DX: Diverticulosis of intestine, part unspecified, without perforation or abscess without bleeding: K57.90

## 2013-02-12 HISTORY — DX: Polyp of colon: K63.5

## 2013-03-09 ENCOUNTER — Encounter (HOSPITAL_COMMUNITY): Payer: Self-pay | Admitting: Emergency Medicine

## 2013-03-09 ENCOUNTER — Emergency Department (HOSPITAL_COMMUNITY)
Admission: EM | Admit: 2013-03-09 | Discharge: 2013-03-09 | Disposition: A | Discharge status: Home / Self Care | Payer: PRIVATE HEALTH INSURANCE | Attending: Emergency Medicine | Admitting: Emergency Medicine

## 2013-03-09 ENCOUNTER — Emergency Department (HOSPITAL_COMMUNITY): Payer: PRIVATE HEALTH INSURANCE

## 2013-03-09 DIAGNOSIS — E785 Hyperlipidemia, unspecified: Secondary | ICD-10-CM | POA: Insufficient documentation

## 2013-03-09 DIAGNOSIS — R110 Nausea: Secondary | ICD-10-CM | POA: Insufficient documentation

## 2013-03-09 DIAGNOSIS — F17200 Nicotine dependence, unspecified, uncomplicated: Secondary | ICD-10-CM | POA: Insufficient documentation

## 2013-03-09 DIAGNOSIS — L272 Dermatitis due to ingested food: Secondary | ICD-10-CM | POA: Insufficient documentation

## 2013-03-09 DIAGNOSIS — Z79899 Other long term (current) drug therapy: Secondary | ICD-10-CM | POA: Insufficient documentation

## 2013-03-09 DIAGNOSIS — M549 Dorsalgia, unspecified: Secondary | ICD-10-CM | POA: Insufficient documentation

## 2013-03-09 DIAGNOSIS — R0789 Other chest pain: Secondary | ICD-10-CM

## 2013-03-09 DIAGNOSIS — R002 Palpitations: Secondary | ICD-10-CM | POA: Insufficient documentation

## 2013-03-09 DIAGNOSIS — R071 Chest pain on breathing: Secondary | ICD-10-CM | POA: Insufficient documentation

## 2013-03-09 DIAGNOSIS — IMO0002 Reserved for concepts with insufficient information to code with codable children: Secondary | ICD-10-CM | POA: Insufficient documentation

## 2013-03-09 LAB — CBC WITH DIFFERENTIAL/PLATELET
Basophils Absolute: 0 10*3/uL (ref 0.0–0.1)
Basophils Relative: 0 % (ref 0–1)
Eosinophils Absolute: 0.1 10*3/uL (ref 0.0–0.7)
Eosinophils Relative: 2 % (ref 0–5)
HCT: 42.4 % (ref 36.0–46.0)
Hemoglobin: 14.1 g/dL (ref 12.0–15.0)
Lymphocytes Relative: 43 % (ref 12–46)
Lymphs Abs: 3.2 10*3/uL (ref 0.7–4.0)
MCH: 27.6 pg (ref 26.0–34.0)
MCHC: 33.3 g/dL (ref 30.0–36.0)
MCV: 83 fL (ref 78.0–100.0)
Monocytes Absolute: 0.4 10*3/uL (ref 0.1–1.0)
Monocytes Relative: 6 % (ref 3–12)
Neutro Abs: 3.7 10*3/uL (ref 1.7–7.7)
Neutrophils Relative %: 50 % (ref 43–77)
Platelets: 198 10*3/uL (ref 150–400)
RBC: 5.11 MIL/uL (ref 3.87–5.11)
RDW: 14.5 % (ref 11.5–15.5)
WBC: 7.5 10*3/uL (ref 4.0–10.5)

## 2013-03-09 LAB — COMPREHENSIVE METABOLIC PANEL
ALT: 16 U/L (ref 0–35)
AST: 18 U/L (ref 0–37)
Albumin: 4.2 g/dL (ref 3.5–5.2)
Alkaline Phosphatase: 62 U/L (ref 39–117)
BUN: 13 mg/dL (ref 6–23)
CO2: 27 mEq/L (ref 19–32)
Calcium: 9.6 mg/dL (ref 8.4–10.5)
Chloride: 107 mEq/L (ref 96–112)
Creatinine, Ser: 0.9 mg/dL (ref 0.50–1.10)
GFR calc Af Amer: 84 mL/min — ABNORMAL LOW (ref >90)
GFR calc non Af Amer: 73 mL/min — ABNORMAL LOW (ref >90)
Glucose, Bld: 100 mg/dL — ABNORMAL HIGH (ref 70–99)
Potassium: 3.7 mEq/L (ref 3.5–5.1)
Sodium: 141 mEq/L (ref 135–145)
Total Bilirubin: 0.4 mg/dL (ref 0.3–1.2)
Total Protein: 7.3 g/dL (ref 6.0–8.3)

## 2013-03-09 LAB — TROPONIN I: Troponin I: <0.3 ng/mL (ref <0.30)

## 2013-03-09 MED ORDER — ASPIRIN 81 MG PO CHEW
324.0000 mg | CHEWABLE_TABLET | Freq: Once | ORAL | Status: AC
  Administered 2013-03-09: 324 mg via ORAL
  Filled 2013-03-09: qty 4

## 2013-03-09 MED ORDER — NAPROXEN 500 MG PO TABS: 500.0000 mg | ORAL_TABLET | Freq: Two times a day (BID) | ORAL | Status: DC

## 2013-03-09 MED ORDER — KETOROLAC TROMETHAMINE 30 MG/ML IJ SOLN
30.0000 mg | Freq: Once | INTRAMUSCULAR | Status: AC
  Administered 2013-03-09: 30 mg via INTRAVENOUS
  Filled 2013-03-09: qty 1

## 2013-03-09 MED ORDER — GI COCKTAIL ~~LOC~~
30.0000 mL | Freq: Once | ORAL | Status: AC
  Administered 2013-03-09: 30 mL via ORAL
  Filled 2013-03-09: qty 30

## 2013-03-09 NOTE — ED Provider Notes (Signed)
Medical screening examination/treatment/procedure(s) were performed by non-physician practitioner and as supervising physician I was immediately available for consultation/collaboration.  Jamala Kohen M Zayn Selley, MD 03/09/13 1554 

## 2013-03-09 NOTE — ED Notes (Signed)
Hurst to take a deep breath on left side.

## 2013-03-09 NOTE — ED Provider Notes (Signed)
Date: 03/09/2013  Rate: 90  Rhythm: normal sinus rhythm  QRS Axis: normal  Intervals: normal  ST/T Wave abnormalities: normal  Conduction Disutrbances: none  Narrative Interpretation:   Old EKG Reviewed: No significant changes noted     Lyanne Co, MD 03/09/13 [phone]

## 2013-03-09 NOTE — ED Provider Notes (Signed)
History     CSN: [id number]  Arrival date & time 03/09/13  0830   First MD Initiated Contact with Patient 03/09/13 [phone]      Chief Complaint  Patient presents with  . Chest Pain    (Consider location/radiation/quality/duration/timing/severity/associated sxs/prior treatment) Patient is a 52 y.o. female presenting with chest pain. The history is provided by the patient. No language interpreter was used.  Chest Pain Pain location:  L chest Pain quality: sharp and stabbing   Pain radiates to:  Mid back Pain radiates to the back: yes   Pain severity:  Moderate Onset quality:  Sudden Duration:  3 days Timing:  Constant Progression:  Unchanged Chronicity:  New Relieved by:  Nothing Worsened by:  Coughing, deep breathing, movement and certain positions Ineffective treatments: ibuprofen. Associated symptoms: back pain, nausea and palpitations   Associated symptoms: no abdominal pain, no altered mental status, no anorexia, no anxiety, no cough, no diaphoresis, no dizziness, no dysphagia, no fever, no headache, no heartburn, no lower extremity edema, no near-syncope, no numbness, no shortness of breath, no syncope, not vomiting and no weakness    Megan Mcknight is a 52 y.o. female who presents to the ED with chest pain x 3 days. The onset was sudden after work while just sitting on the couch she noted the sharp pain.  She describes the pain as sharp shooting. She rates the pain as 6/10. The pain increases with deep breath and movement. 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   Past Medical History  Diagnosis Date  . Hyperlipidemia   . Nerve pain   . Hyperlipemia     Past Surgical History  Procedure Laterality Date  . Partial hysterectomy    . Bones removed from baby toes    . Colonoscopy  02/16/2012    Procedure: COLONOSCOPY;  Surgeon: Corbin Ade, MD;  Location: [facility];  Service: Endoscopy;  Laterality: N/A;  1:30  .  Abdominal hysterectomy      Family History  Problem Relation Age of Onset  . Heart defect      family history   . Asthma      family history   . Colon polyps Mother     History  Substance Use Topics  . Smoking status: Current Every Day Smoker -- 0.50 packs/day    Types: Cigarettes  . Smokeless tobacco: Not on file  . Alcohol Use: Yes     Comment: one glass every three weeks    OB History   Grav Para Term Preterm Abortions TAB SAB Ect Mult Living                  Review of Systems  Constitutional: Negative for fever, diaphoresis and activity change.  HENT: Negative for trouble swallowing, neck pain and neck stiffness.   Respiratory: Negative for cough and shortness of breath.   Cardiovascular: Positive for chest pain and palpitations. Negative for syncope and near-syncope.  Gastrointestinal: Positive for nausea. Negative for heartburn, vomiting, abdominal pain and anorexia.  Genitourinary: Negative for dysuria and frequency.  Musculoskeletal: Positive for back pain.  Skin: Negative for pallor and rash.  Allergic/Immunologic: Positive for food allergies. Negative for environmental allergies and immunocompromised state.  Neurological: Negative for dizziness, weakness, light-headedness, numbness and headaches.  Psychiatric/Behavioral: Negative for confusion and altered mental status. The patient is not nervous/anxious.     Allergies  Iodine and Shellfish-derived products  Home  Medications   Current Outpatient Rx  Name  Route  Sig  Dispense  Refill  . atorvastatin (LIPITOR) 10 MG tablet   Oral   Take 10 mg by mouth daily.           Marland Kitchen EXPIRED: gabapentin (NEURONTIN) 100 MG capsule   Oral   Take 1 capsule (100 mg total) by mouth at bedtime.   90 capsule   2     May increase up to 3 tabs at night   . montelukast (SINGULAIR) 10 MG tablet   Oral   Take 10 mg by mouth at bedtime.         . naproxen (NAPROSYN) 500 MG tablet   Oral   Take 500 mg by mouth  daily.           . peg 3350 powder (MOVIPREP) 100 G SOLR   Oral   Take 1 kit (100 g total) by mouth once. As directed Please purchase 1 Fleets enema to use with the prep   1 kit   0   . rosuvastatin (CRESTOR) 10 MG tablet   Oral   Take 10 mg by mouth daily.           BP 142/86  Pulse 85  Temp(Src) 98.9 F (37.2 C) (Oral)  Resp 18  SpO2 98%  Physical Exam  Nursing note and vitals reviewed. Constitutional: She is oriented to person, place, and time. She appears well-developed and well-nourished.  HENT:  Head: Normocephalic and atraumatic.  Eyes: EOM are normal. Pupils are equal, round, and reactive to light.  Neck: Normal range of motion. Neck supple. No JVD present. No tracheal deviation present.  Cardiovascular: Normal rate and regular rhythm.   Pulmonary/Chest: Effort normal and breath sounds normal. No respiratory distress. She exhibits tenderness.  There is tenderness in the left rib area just underneath the left breast with palpation. The pain radiates to the back.   Abdominal: Soft. There is no tenderness.  Musculoskeletal: Normal range of motion. She exhibits no edema.  Neurological: She is alert and oriented to person, place, and time. No cranial nerve deficit.  Skin: Skin is warm and dry.  Psychiatric: She has a normal mood and affect. Her behavior is normal. Judgment and thought content normal.   Results for orders placed during the hospital encounter of 03/09/13 (from the past 24 hour(s))  CBC WITH DIFFERENTIAL     Status: None   Collection Time    03/09/13  8:50 AM      Result Value Range   WBC 7.5  4.0 - 10.5 K/uL   RBC 5.11  3.87 - 5.11 MIL/uL   Hemoglobin 14.1  12.0 - 15.0 g/dL   HCT 40.9  81.1 - 91.4 %   MCV 83.0  78.0 - 100.0 fL   MCH 27.6  26.0 - 34.0 pg   MCHC 33.3  30.0 - 36.0 g/dL   RDW 78.2  95.6 - 21.3 %   Platelets 198  150 - 400 K/uL   Neutrophils Relative 50  43 - 77 %   Neutro Abs 3.7  1.7 - 7.7 K/uL   Lymphocytes Relative 43  12 -  46 %   Lymphs Abs 3.2  0.7 - 4.0 K/uL   Monocytes Relative 6  3 - 12 %   Monocytes Absolute 0.4  0.1 - 1.0 K/uL   Eosinophils Relative 2  0 - 5 %   Eosinophils Absolute 0.1  0.0 - 0.7 K/uL  Basophils Relative 0  0 - 1 %   Basophils Absolute 0.0  0.0 - 0.1 K/uL  COMPREHENSIVE METABOLIC PANEL     Status: Abnormal   Collection Time    03/09/13  8:50 AM      Result Value Range   Sodium 141  135 - 145 mEq/L   Potassium 3.7  3.5 - 5.1 mEq/L   Chloride 107  96 - 112 mEq/L   CO2 27  19 - 32 mEq/L   Glucose, Bld 100 (*) 70 - 99 mg/dL   BUN 13  6 - 23 mg/dL   Creatinine, Ser 4.09  0.50 - 1.10 mg/dL   Calcium 9.6  8.4 - 81.1 mg/dL   Total Protein 7.3  6.0 - 8.3 g/dL   Albumin 4.2  3.5 - 5.2 g/dL   AST 18  0 - 37 U/L   ALT 16  0 - 35 U/L   Alkaline Phosphatase 62  39 - 117 U/L   Total Bilirubin 0.4  0.3 - 1.2 mg/dL   GFR calc non Af Amer 73 (*) >90 mL/min   GFR calc Af Amer 84 (*) >90 mL/min  TROPONIN I     Status: None   Collection Time    03/09/13  8:50 AM      Result Value Range   Troponin I <0.30  <0.30 ng/mL     ED Course  Procedures (including critical care time) Dg Chest 2 View  03/09/2013  *RADIOLOGY REPORT*  Clinical Data: Chest pain  CHEST - 2 VIEW  Comparison: 02/05/2005  Findings: Cardiomediastinal silhouette is stable.  No acute infiltrate or pleural effusion.  No pulmonary edema.  Bony thorax is stable.  IMPRESSION: No active disease.  No significant change.   Original Report Authenticated By: Natasha Mead, M.D.      MDM   Normal EKG 10:05 am patient feeling much better after Toradol for pain. Laughing and talking. No pain at this time. I have reviewed this patient's vital signs, nurses notes, appropriate labs and imaging.  I have discussed findings with the patient and plan of care. This pain today does not appear cardiac. Will treat with NSAIDS and she will follow up with her PCP. She will return here as needed.   Medication List    TAKE these medications        naproxen 500 MG tablet  Commonly known as:  NAPROSYN  Take 1 tablet (500 mg total) by mouth 2 (two) times daily.      ASK your doctor about these medications       ibuprofen 200 MG tablet  Commonly known as:  ADVIL,MOTRIN  Take 400 mg by mouth every 6 (six) hours as needed for pain.     montelukast 10 MG tablet  Commonly known as:  SINGULAIR  Take 10 mg by mouth at bedtime as needed (Allergies).     OVER THE COUNTER MEDICATION  Take 1 tablet by mouth daily as needed. Green Bean Coffee Extract Supplement.     rosuvastatin 10 MG tablet  Commonly known as:  CRESTOR  Take 10 mg by mouth daily.              [facility] Orlene Och, [place] 03/09/13 1011

## 2013-03-09 NOTE — ED Notes (Signed)
Constant chest pain for three days. Pt states now radiating down left side.

## 2013-04-11 ENCOUNTER — Encounter (HOSPITAL_COMMUNITY): Payer: Self-pay | Admitting: *Deleted

## 2013-04-11 ENCOUNTER — Emergency Department (HOSPITAL_COMMUNITY)
Admission: EM | Admit: 2013-04-11 | Discharge: 2013-04-12 | Disposition: A | Discharge status: Home / Self Care | Payer: PRIVATE HEALTH INSURANCE | Attending: Emergency Medicine | Admitting: Emergency Medicine

## 2013-04-11 DIAGNOSIS — M7918 Myalgia, other site: Secondary | ICD-10-CM

## 2013-04-11 DIAGNOSIS — G5702 Lesion of sciatic nerve, left lower limb: Secondary | ICD-10-CM

## 2013-04-11 DIAGNOSIS — F17200 Nicotine dependence, unspecified, uncomplicated: Secondary | ICD-10-CM | POA: Insufficient documentation

## 2013-04-11 DIAGNOSIS — M533 Sacrococcygeal disorders, not elsewhere classified: Secondary | ICD-10-CM | POA: Insufficient documentation

## 2013-04-11 DIAGNOSIS — G5700 Lesion of sciatic nerve, unspecified lower limb: Secondary | ICD-10-CM | POA: Insufficient documentation

## 2013-04-11 DIAGNOSIS — E785 Hyperlipidemia, unspecified: Secondary | ICD-10-CM | POA: Insufficient documentation

## 2013-04-11 DIAGNOSIS — Z79899 Other long term (current) drug therapy: Secondary | ICD-10-CM | POA: Insufficient documentation

## 2013-04-11 DIAGNOSIS — J3489 Other specified disorders of nose and nasal sinuses: Secondary | ICD-10-CM | POA: Insufficient documentation

## 2013-04-11 MED ORDER — IBUPROFEN 800 MG PO TABS
800.0000 mg | ORAL_TABLET | Freq: Once | ORAL | Status: AC
  Administered 2013-04-11: 800 mg via ORAL
  Filled 2013-04-11: qty 1

## 2013-04-11 MED ORDER — DIAZEPAM 5 MG PO TABS
5.0000 mg | ORAL_TABLET | Freq: Once | ORAL | Status: AC
  Administered 2013-04-11: 5 mg via ORAL
  Filled 2013-04-11: qty 1

## 2013-04-11 MED ORDER — OXYCODONE HCL 5 MG PO TABS
5.0000 mg | ORAL_TABLET | Freq: Once | ORAL | Status: AC
  Administered 2013-04-11: 5 mg via ORAL
  Filled 2013-04-11: qty 1

## 2013-04-11 NOTE — ED Notes (Signed)
Pain in left flank area radiating into left leg

## 2013-04-11 NOTE — ED Provider Notes (Addendum)
History    This chart was scribed for Megan Skene, MD by Megan Mcknight, ED Scribe. This patient was seen in room [facility] and the patient's care was started at 11:13 PM    CSN: [id number]  Arrival date & time 04/11/13  2247   First MD Initiated Contact with Patient 04/11/13 2308      Chief Complaint  Patient presents with  . Back Pain     The history is provided by the patient. No language interpreter was used.  Megan Mcknight is a 52 y.o. female who presents to the Emergency Department complaining of constant, sharp left hip/buttock pain that radiates down left lateral lower extremity.  Pain began 2 days ago with no associated unusual lifting or activities.  Pain worsened by ambulation and bearing weight.  Pain not improved by Tylenol x 4, last took 2 pills shortly PTA.  Pt denies associated numbness, tingling, or weakness, saddle paresthesias, incontinence of bowel or bladder.  Pt denies recent fever, chills, weight loss, abdominal pain, chest pain, dyspnea, HA, neck pain.  Last bowel movement yesterday.  Pt has been having sinus and nasal congestion normal to seasonal allergies for which she takes PO Singulair.    PCP is Dr. Felecia Mcknight.  Past Medical History  Diagnosis Date  . Hyperlipidemia   . Nerve pain   . Hyperlipemia     Past Surgical History  Procedure Laterality Date  . Partial hysterectomy    . Bones removed from baby toes    . Colonoscopy  02/16/2012    Procedure: COLONOSCOPY;  Surgeon: Corbin Ade, MD;  Location: [facility];  Service: Endoscopy;  Laterality: N/A;  1:30  . Abdominal hysterectomy      Family History  Problem Relation Age of Onset  . Heart defect      family history   . Asthma      family history   . Colon polyps Mother     History  Substance Use Topics  . Smoking status: Current Every Day Smoker -- 0.50 packs/day    Types: Cigarettes  . Smokeless tobacco: Not on file  . Alcohol Use: Yes     Comment: one glass every three weeks    No  OB history provided.   Review of Systems  Constitutional: Negative for fever and unexpected weight change.  HENT: Positive for congestion (normal to allergies) and sinus pressure (normal to allergies). Negative for neck pain.   Eyes: Negative for itching.  Respiratory: Negative for shortness of breath.   Cardiovascular: Negative for chest pain.  Gastrointestinal: Negative for abdominal pain and diarrhea.  Musculoskeletal: Positive for back pain.  Neurological: Negative for weakness, numbness and headaches.  All other systems reviewed and are negative.    Allergies  Iodine and Shellfish-derived products  Home Medications   Current Outpatient Rx  Name  Route  Sig  Dispense  Refill  . ibuprofen (ADVIL,MOTRIN) 200 MG tablet   Oral   Take 400 mg by mouth every 6 (six) hours as needed for pain.         . montelukast (SINGULAIR) 10 MG tablet   Oral   Take 10 mg by mouth at bedtime as needed (Allergies).          . naproxen (NAPROSYN) 500 MG tablet   Oral   Take 1 tablet (500 mg total) by mouth 2 (two) times daily.   20 tablet   0   . OVER THE COUNTER MEDICATION   Oral  Take 1 tablet by mouth daily as needed. Green Bean Coffee Extract Supplement.         . rosuvastatin (CRESTOR) 10 MG tablet   Oral   Take 10 mg by mouth daily.           BP 156/81  Pulse 108  Temp(Src) 99.1 F (37.3 C) (Oral)  Resp 20  Ht 5\' 8"  (1.727 m)  Wt 225 lb (102.059 kg)  BMI 34.22 kg/m2  SpO2 98%  Physical Exam  Nursing notes reviewed.  Electronic medical record reviewed. VITAL SIGNS:   Filed Vitals:   04/11/13 2251 04/12/13 0034  BP: 156/81 125/72  Pulse: 108 85  Temp: 99.1 F (37.3 C)   TempSrc: Oral   Resp: 20 18  Height: 5\' 8"  (1.727 m)   Weight: 225 lb (102.059 kg)   SpO2: 98% 93%   CONSTITUTIONAL: Awake, oriented, appears non-toxic HENT: Atraumatic, normocephalic, oral mucosa pink and moist, airway patent. Nares patent without drainage. External ears  normal. EYES: Conjunctiva clear, EOMI, PERRLA NECK: Trachea midline, non-tender, supple CARDIOVASCULAR: Normal heart rate, Normal rhythm, No murmurs, rubs, gallops PULMONARY/CHEST: Clear to auscultation, no rhonchi, wheezes, or rales. Symmetrical breath sounds. Non-tender. ABDOMINAL: Non-distended, soft, non-tender - no rebound or guarding.  BS normal. NEUROLOGIC: Non-focal, moving all four extremities, no gross sensory or motor deficits. BACK: No step-offs or deformities, nontender to palpation in the midline, no skin abnormalities. Left mid-buttock palpation recreates symptoms. External rotation exacerbates it. She can walk, antalgic gait, favors the left, pain on standing on the left leg and pulling through of the right leg. EXTREMITIES: No clubbing, cyanosis, or edema SKIN: Warm, Dry, No erythema, No rash  ED Course  Procedures (including critical care time) DIAGNOSTIC STUDIES: Oxygen Saturation is 98% on room air, normal by my interpretation.    COORDINATION OF CARE: 11:25 PM- Informed pt that pain appears to be muscular in nature and discussed treatment plan.  Educated pt that acetaminophen should be taken more conservatively in the future.  Pt verbalizes understanding and agrees with plan.    Labs Reviewed - No data to display No results found.   1. Acute buttock pain   2. Piriformis syndrome of left side       MDM  Patient arrives with buttock pain that radiates into the left thigh history and physical exam are suggestive of puriform syndrome applying pressure to the sciatic nerve. No pain in the back, no recent injuries, no history of a "red-letter" event that may have caused the pain.  I do not suspect a bulging ruptured disc, no fevers or chills, I do not suspect epidural abscess, discitis, osteomyelitis, cord compression syndrome or cauda equina syndrome.  No red flags for back pain.  Treated the patient's pain aggressively and she feels much better with very minimal pain at  this time. We'll discharge the patient home with a similar pain medicines, instructions for stretching the left leg and followup with her primary care physician in 2 days. Return to the ER for any weakness, bowel or bladder incontinence or urinary retention or any other concerning symptoms.   I personally performed the services described in this documentation, which was scribed in my presence. The recorded information has been reviewed and is accurate. Megan Mcknight, M.D.         Megan Skene, MD 04/12/13 0107  Megan Skene, MD 04/12/13 9811

## 2013-04-12 MED ORDER — DIAZEPAM 5 MG PO TABS: 5.0000 mg | ORAL_TABLET | Freq: Two times a day (BID) | ORAL | Status: DC

## 2013-04-12 MED ORDER — OXYCODONE-ACETAMINOPHEN 5-325 MG PO TABS: 1.0000 | ORAL_TABLET | ORAL | Status: DC | PRN

## 2013-04-12 MED ORDER — IBUPROFEN 600 MG PO TABS: 600.0000 mg | ORAL_TABLET | Freq: Four times a day (QID) | ORAL | Status: DC | PRN

## 2013-04-12 NOTE — Discharge Instructions (Signed)
The maximum daily dose of acetaminophen (Tylenol) is not to exceed 4,000 mg in a 24 hour period. Acetaminophen can also be found in many over-the-counter cold medications and in Percocet (the medicine you have) given for pain. Do not take any additional Tylenol or generic acetaminophen while taking Percocet.  Return to the ER for worsening symptoms, weakness, numbness, inability to urinate or if you have urinary or fecal incontinence (can't control pee or poop.)   Piriformis Syndrome with Rehab Piriformis syndrome is a condition the affects the nervous system in the area of the hip, and is characterized by pain and possibly a loss of feeling in the backside (posterior) thigh that may extend down the entire length of the leg. The symptoms are caused by an increase in pressure on the sciatic nerve by the piriformis muscle, which is on the back of the hip and is responsible for externally rotating the hip. The sciatic nerve and its branches connect to much of the leg. Normally the sciatic nerve runs between the piriformis muscle and other muscles. However, in certain individuals the nerve runs through the muscle, which causes an increase in pressure on the nerve and results in the symptoms of piriformis syndrome. SYMPTOMS   Pain, tingling, numbness, or burning in the back of the thigh that may also extend down the entire leg.  Occasionally, tenderness in the buttock.  Loss of function of the leg.  Pain that worsens when using the piriformis muscle (running, jumping, or stairs).  Pain that increases with prolonged sitting.  Pain that is lessened by laying flat on the back. CAUSES   Piriformis syndrome is the result of an increase in pressure placed on the sciatic nerve. Often times piriformis syndrome is an overuse injury.  Stress placed on the nerve from a sudden increase in the intensity, frequency, or duration of training.  Compensation of other extremity injuries. RISK INCREASES  WITH:  Sports that involve the piriformis muscle (running, walking or jumping).  You are born with (congenital) a defect in which the sciatic nerve passes through the muscle. PREVENTION  Warm up and stretch properly before activity.  Allow for adequate recovery between workouts.  Maintain physical fitness:  Strength, flexibility, and endurance.  Cardiovascular fitness. PROGNOSIS  If treated properly, then the symptoms of piriformis syndrome usually resolve in 2 to 6 weeks. RELATED COMPLICATIONS   Persistent and possibly permanent pain and numbness in the lower extremity.  Weakness of the extremity that may progress to disability and inability to compete. TREATMENT  The most effective treatment for piriformis syndrome is rest from any activities that aggravate the symptoms. Ice and pain medication may help reduce pain and inflammation. The use of strengthening and stretching exercises may help reduce pain with activity. These exercises may be performed at home or with a therapist. A referral to a therapist may be given for further evaluation and treatment, such as ultrasound. Corticosteroid injections may be given to reduce inflammation that is causing pressure to be placed on the sciatic nerve. If non-surgical (conservative) treatment is unsuccessful, then surgery may be recommended.  MEDICATION   If pain medication is necessary, then nonsteroidal anti-inflammatory medications, such as aspirin and ibuprofen, or other minor pain relievers, such as acetaminophen, are often recommended.  Do not take pain medication for 7 days before surgery.  Prescription pain relievers may be given if deemed necessary by your caregiver. Use only as directed and only as much as you need.  Corticosteroid injections may be given by  your caregiver. These injections should be reserved for the most serious cases, because they may only be given a certain number of times. HEAT AND COLD:   Cold treatment  (icing) relieves pain and reduces inflammation. Cold treatment should be applied for 10 to 15 minutes every 2 to 3 hours for inflammation and pain and immediately after any activity that aggravates your symptoms. Use ice packs or massage the area with a piece of ice (ice massage).  Heat treatment may be used prior to performing the stretching and strengthening activities prescribed by your caregiver, physical therapist, or athletic trainer. Use a heat pack or soak the injury in warm water. SEEK IMMEDIATE MEDICAL CARE IF:  Treatment seems to offer no benefit, or the condition worsens.  Any medications produce adverse side effects. EXERCISES - STOP if any of these exercise produce severe pain, you should feel a gentle stretch.  RANGE OF MOTION (ROM) AND STRETCHING EXERCISES - Piriformis Syndrome These exercises may help you when beginning to rehabilitate your injury. Your symptoms may resolve with or without further involvement from your physician, physical therapist or athletic trainer. While completing these exercises, remember:   Restoring tissue flexibility helps normal motion to return to the joints. This allows healthier, less painful movement and activity.  An effective stretch should be held for at least 30 seconds.  A stretch should never be painful. You should only feel a gentle lengthening or release in the stretched tissue. STRETCH - Hip Rotators  Lie on your back on a firm surface. Grasp your right / left knee with your right / left hand and your ankle with your opposite hand.  Keeping your hips and shoulders firmly planted, gently pull your right / left knee and rotate your lower leg toward your opposite shoulder until you feel a stretch in your buttocks.  Hold this stretch for __________ seconds. Repeat this stretch __________ times. Complete this stretch __________ times per day. STRETCH  Iliotibial Band  On the floor or bed, lie on your side so your right / left leg is on  top. Bend your knee and grab your ankle.  Slowly bring your knee back so that your thigh is in line with your trunk. Keep your heel at your buttocks and gently arch your back so your head, shoulders and hips line up.  Slowly lower your leg so that your knee approaches the floor/bed until you feel a gentle stretch on the outside of your right / left thigh. If you do not feel a stretch and your knee will not fall farther, place the heel of your opposite foot on top of your knee and pull your thigh down farther.  Hold this stretch for __________ seconds. Repeat __________ times. Complete __________ times per day. STRENGTHENING EXERCISES - Piriformis Syndrome  These are some of the caregiver again or until your symptoms are resolved. Remember:   Strong muscles with good endurance tolerate stress better.  Do the exercises as initially prescribed by your caregiver. Progress slowly with each exercise, gradually increasing the number of repetitions and weight used under their guidance. STRENGTH - Hip Abductors, Straight Leg Raises Be aware of your form throughout the entire exercise so that you exercise the correct muscles. Sloppy form means that you are not strengthening the correct muscles.  Lie on your side so that your head, shoulders, knee and hip line up. You may bend your lower knee to help maintain your balance. Your right / left leg should be on top.  Roll your hips slightly forward, so that your hips are stacked directly over each other and your right / left knee is facing forward.  Lift your top leg up 4-6 inches, leading with your heel. Be sure that your foot does not drift forward or that your knee does not roll toward the ceiling.  Hold this position for __________ seconds. You should feel the muscles in your outer hip lifting (you may not notice this until your leg begins to tire).  Slowly lower your leg to the starting position. Allow the muscles to fully relax before beginning the  next repetition. Repeat __________ times. Complete this exercise __________ times per day.  STRENGTH - Hip Abductors, Quadriped  On a firm, lightly padded surface, position yourself on your hands and knees. Your hands should be directly below your shoulders and your knees should be directly below your hips.  Keeping your right / left knee bent, lift your leg out to the side. Keep your legs level and in line with your shoulders.  Position yourself on your hands and knees.  Hold for __________ seconds.  Keeping your trunk steady and your hips level, slowly lower your leg to the starting position. Repeat __________ times. Complete this exercise __________ times per day.  STRENGTH - Hip Abductors, Standing  Tie one end of a rubber exercise band/tubing to a secure surface (table, pole) and tie a loop at the other end.  Place the loop around your right / left ankle. Keeping your ankle with the band directly opposite of the secured end, step away until there is tension in the tube/band.  Hold onto a chair as needed for balance.  Keeping your back upright, your shoulders over your hips, and your toes pointing forward, lift your right / left leg out to your side. Be sure to lift your leg with your hip muscles. Do not "throw" your leg or tip your body to lift your leg.  Slowly and with control, return to the starting position. Repeat exercise __________ times. Complete this exercise __________ times per day.  Document Released: 12/01/2005 Document Revised: 06/01/2012 Document Reviewed: 03/15/2009 [facility] Patient Information 2013 [place], [place].

## 2013-05-06 ENCOUNTER — Encounter (HOSPITAL_COMMUNITY): Payer: Self-pay | Admitting: *Deleted

## 2013-05-06 ENCOUNTER — Emergency Department (HOSPITAL_COMMUNITY): Payer: PRIVATE HEALTH INSURANCE

## 2013-05-06 ENCOUNTER — Inpatient Hospital Stay (HOSPITAL_COMMUNITY)
Admission: EM | Admit: 2013-05-06 | Discharge: 2013-05-24 | DRG: 820 | Disposition: A | Discharge status: Home / Self Care | Payer: PRIVATE HEALTH INSURANCE | Attending: Internal Medicine | Admitting: Internal Medicine

## 2013-05-06 DIAGNOSIS — Z87891 Personal history of nicotine dependence: Secondary | ICD-10-CM

## 2013-05-06 DIAGNOSIS — I4891 Unspecified atrial fibrillation: Secondary | ICD-10-CM | POA: Diagnosis present

## 2013-05-06 DIAGNOSIS — R0609 Other forms of dyspnea: Secondary | ICD-10-CM | POA: Diagnosis present

## 2013-05-06 DIAGNOSIS — R0602 Shortness of breath: Secondary | ICD-10-CM | POA: Diagnosis present

## 2013-05-06 DIAGNOSIS — I517 Cardiomegaly: Secondary | ICD-10-CM

## 2013-05-06 DIAGNOSIS — D649 Anemia, unspecified: Secondary | ICD-10-CM | POA: Diagnosis present

## 2013-05-06 DIAGNOSIS — K5730 Diverticulosis of large intestine without perforation or abscess without bleeding: Secondary | ICD-10-CM | POA: Diagnosis present

## 2013-05-06 DIAGNOSIS — I10 Essential (primary) hypertension: Secondary | ICD-10-CM | POA: Diagnosis present

## 2013-05-06 DIAGNOSIS — R0989 Other specified symptoms and signs involving the circulatory and respiratory systems: Secondary | ICD-10-CM | POA: Diagnosis present

## 2013-05-06 DIAGNOSIS — IMO0002 Reserved for concepts with insufficient information to code with codable children: Principal | ICD-10-CM | POA: Diagnosis present

## 2013-05-06 DIAGNOSIS — R509 Fever, unspecified: Secondary | ICD-10-CM | POA: Diagnosis present

## 2013-05-06 DIAGNOSIS — K219 Gastro-esophageal reflux disease without esophagitis: Secondary | ICD-10-CM

## 2013-05-06 DIAGNOSIS — R7309 Other abnormal glucose: Secondary | ICD-10-CM | POA: Diagnosis not present

## 2013-05-06 DIAGNOSIS — E785 Hyperlipidemia, unspecified: Secondary | ICD-10-CM | POA: Diagnosis present

## 2013-05-06 DIAGNOSIS — J189 Pneumonia, unspecified organism: Secondary | ICD-10-CM | POA: Diagnosis present

## 2013-05-06 DIAGNOSIS — C8589 Other specified types of non-Hodgkin lymphoma, extranodal and solid organ sites: Secondary | ICD-10-CM

## 2013-05-06 DIAGNOSIS — Z90710 Acquired absence of both cervix and uterus: Secondary | ICD-10-CM

## 2013-05-06 DIAGNOSIS — R110 Nausea: Secondary | ICD-10-CM | POA: Diagnosis not present

## 2013-05-06 DIAGNOSIS — N289 Disorder of kidney and ureter, unspecified: Secondary | ICD-10-CM | POA: Diagnosis present

## 2013-05-06 DIAGNOSIS — C8590 Non-Hodgkin lymphoma, unspecified, unspecified site: Secondary | ICD-10-CM | POA: Diagnosis present

## 2013-05-06 DIAGNOSIS — D630 Anemia in neoplastic disease: Secondary | ICD-10-CM | POA: Diagnosis present

## 2013-05-06 HISTORY — DX: Diverticulosis of intestine, part unspecified, without perforation or abscess without bleeding: K57.90

## 2013-05-06 HISTORY — DX: Cardiac arrhythmia, unspecified: I49.9

## 2013-05-06 HISTORY — DX: Acute bronchitis, unspecified: J20.9

## 2013-05-06 HISTORY — DX: Personal history of nicotine dependence: Z87.891

## 2013-05-06 HISTORY — DX: Other seasonal allergic rhinitis: J30.2

## 2013-05-06 HISTORY — DX: Polyp of colon: K63.5

## 2013-05-06 LAB — COMPREHENSIVE METABOLIC PANEL
ALT: 26 U/L (ref 0–35)
AST: 17 U/L (ref 0–37)
Albumin: 3.2 g/dL — ABNORMAL LOW (ref 3.5–5.2)
Alkaline Phosphatase: 97 U/L (ref 39–117)
BUN: 14 mg/dL (ref 6–23)
CO2: 24 mEq/L (ref 19–32)
Calcium: 9.7 mg/dL (ref 8.4–10.5)
Chloride: 99 mEq/L (ref 96–112)
Creatinine, Ser: 1.54 mg/dL — ABNORMAL HIGH (ref 0.50–1.10)
GFR calc Af Amer: 44 mL/min — ABNORMAL LOW (ref >90)
GFR calc non Af Amer: 38 mL/min — ABNORMAL LOW (ref >90)
Glucose, Bld: 129 mg/dL — ABNORMAL HIGH (ref 70–99)
Potassium: 3.6 mEq/L (ref 3.5–5.1)
Sodium: 140 mEq/L (ref 135–145)
Total Bilirubin: 0.4 mg/dL (ref 0.3–1.2)
Total Protein: 8.3 g/dL (ref 6.0–8.3)

## 2013-05-06 LAB — TROPONIN I
Troponin I: <0.3 ng/mL (ref <0.30)
Troponin I: <0.3 ng/mL (ref <0.30)

## 2013-05-06 LAB — MAGNESIUM: Magnesium: 2.1 mg/dL (ref 1.5–2.5)

## 2013-05-06 LAB — CBC WITH DIFFERENTIAL/PLATELET
Basophils Absolute: 0.1 10*3/uL (ref 0.0–0.1)
Basophils Relative: 0 % (ref 0–1)
Eosinophils Absolute: 0.1 10*3/uL (ref 0.0–0.7)
Eosinophils Relative: 0 % (ref 0–5)
HCT: 37 % (ref 36.0–46.0)
Hemoglobin: 11.9 g/dL — ABNORMAL LOW (ref 12.0–15.0)
Lymphocytes Relative: 19 % (ref 12–46)
Lymphs Abs: 3.6 10*3/uL (ref 0.7–4.0)
MCH: 26.3 pg (ref 26.0–34.0)
MCHC: 32.2 g/dL (ref 30.0–36.0)
MCV: 81.7 fL (ref 78.0–100.0)
Monocytes Absolute: 0.8 10*3/uL (ref 0.1–1.0)
Monocytes Relative: 4 % (ref 3–12)
Neutro Abs: 14.7 10*3/uL — ABNORMAL HIGH (ref 1.7–7.7)
Neutrophils Relative %: 76 % (ref 43–77)
Platelets: 386 10*3/uL (ref 150–400)
RBC: 4.53 MIL/uL (ref 3.87–5.11)
RDW: 13.3 % (ref 11.5–15.5)
WBC: 19.3 10*3/uL — ABNORMAL HIGH (ref 4.0–10.5)

## 2013-05-06 LAB — MRSA PCR SCREENING: MRSA by PCR: NEGATIVE

## 2013-05-06 LAB — PRO B NATRIURETIC PEPTIDE: Pro B Natriuretic peptide (BNP): 15.8 pg/mL (ref 0–125)

## 2013-05-06 LAB — PHOSPHORUS: Phosphorus: 3.6 mg/dL (ref 2.3–4.6)

## 2013-05-06 MED ORDER — DIGOXIN 0.25 MG/ML IJ SOLN
0.2500 mg | INTRAMUSCULAR | Status: AC
  Administered 2013-05-06: 0.25 mg via INTRAVENOUS

## 2013-05-06 MED ORDER — MORPHINE SULFATE 2 MG/ML IJ SOLN: 2.0000 mg | INTRAMUSCULAR | Status: DC | PRN

## 2013-05-06 MED ORDER — LEVOFLOXACIN IN D5W 750 MG/150ML IV SOLN
750.0000 mg | Freq: Once | INTRAVENOUS | Status: AC
  Administered 2013-05-06: 750 mg via INTRAVENOUS
  Filled 2013-05-06: qty 150

## 2013-05-06 MED ORDER — ACETAMINOPHEN 325 MG PO TABS
650.0000 mg | ORAL_TABLET | Freq: Four times a day (QID) | ORAL | Status: DC | PRN
  Administered 2013-05-11 – 2013-05-19 (×9): 650 mg via ORAL
  Filled 2013-05-06 (×9): qty 2

## 2013-05-06 MED ORDER — ONDANSETRON HCL 4 MG/2ML IJ SOLN: 4.0000 mg | Freq: Four times a day (QID) | INTRAMUSCULAR | Status: DC | PRN

## 2013-05-06 MED ORDER — SODIUM CHLORIDE 0.9 % IV BOLUS (SEPSIS)
1000.0000 mL | Freq: Once | INTRAVENOUS | Status: AC
  Administered 2013-05-06: 1000 mL via INTRAVENOUS

## 2013-05-06 MED ORDER — HYDROCORTISONE 2.5 % RE CREA
TOPICAL_CREAM | Freq: Every day | RECTAL | Status: AC
  Administered 2013-05-06: 1 via RECTAL
  Administered 2013-05-07 – 2013-05-10 (×4): via RECTAL
  Filled 2013-05-06: qty 28.35

## 2013-05-06 MED ORDER — POTASSIUM CHLORIDE CRYS ER 20 MEQ PO TBCR
30.0000 meq | EXTENDED_RELEASE_TABLET | Freq: Once | ORAL | Status: AC
  Administered 2013-05-06: 30 meq via ORAL
  Filled 2013-05-06: qty 1

## 2013-05-06 MED ORDER — VANCOMYCIN HCL IN DEXTROSE 1-5 GM/200ML-% IV SOLN
1000.0000 mg | Freq: Two times a day (BID) | INTRAVENOUS | Status: DC
  Administered 2013-05-06 – 2013-05-08 (×5): 1000 mg via INTRAVENOUS
  Filled 2013-05-06 (×7): qty 200

## 2013-05-06 MED ORDER — DILTIAZEM HCL 60 MG PO TABS
60.0000 mg | ORAL_TABLET | Freq: Three times a day (TID) | ORAL | Status: DC
  Administered 2013-05-06 – 2013-05-10 (×13): 60 mg via ORAL
  Filled 2013-05-06 (×14): qty 1

## 2013-05-06 MED ORDER — DILTIAZEM HCL 30 MG PO TABS: 30.0000 mg | ORAL_TABLET | Freq: Three times a day (TID) | ORAL | Status: DC

## 2013-05-06 MED ORDER — ENOXAPARIN SODIUM 40 MG/0.4ML ~~LOC~~ SOLN
40.0000 mg | SUBCUTANEOUS | Status: DC
  Administered 2013-05-06 – 2013-05-11 (×6): 40 mg via SUBCUTANEOUS
  Filled 2013-05-06 (×6): qty 0.4

## 2013-05-06 MED ORDER — DILTIAZEM HCL 100 MG IV SOLR
5.0000 mg/h | INTRAVENOUS | Status: DC
  Administered 2013-05-06 (×2): 5 mg/h via INTRAVENOUS
  Filled 2013-05-06: qty 100

## 2013-05-06 MED ORDER — BENZONATATE 100 MG PO CAPS
100.0000 mg | ORAL_CAPSULE | Freq: Three times a day (TID) | ORAL | Status: DC
  Administered 2013-05-06 – 2013-05-20 (×42): 100 mg via ORAL
  Filled 2013-05-06 (×45): qty 1

## 2013-05-06 MED ORDER — DILTIAZEM HCL 25 MG/5ML IV SOLN
10.0000 mg | Freq: Once | INTRAVENOUS | Status: AC
  Administered 2013-05-06: 10 mg via INTRAVENOUS
  Filled 2013-05-06: qty 5

## 2013-05-06 MED ORDER — DILTIAZEM HCL 25 MG/5ML IV SOLN
10.0000 mg | Freq: Once | INTRAVENOUS | Status: AC
  Administered 2013-05-06: 10 mg via INTRAVENOUS

## 2013-05-06 MED ORDER — POTASSIUM CHLORIDE IN NACL 20-0.9 MEQ/L-% IV SOLN
INTRAVENOUS | Status: DC
  Administered 2013-05-06: 13:00:00 via INTRAVENOUS
  Administered 2013-05-07: 100 mL via INTRAVENOUS
  Administered 2013-05-07: 1000 mL via INTRAVENOUS
  Administered 2013-05-08 – 2013-05-11 (×3): via INTRAVENOUS
  Filled 2013-05-06 (×2): qty 1000

## 2013-05-06 MED ORDER — OXYCODONE HCL 5 MG PO TABS
5.0000 mg | ORAL_TABLET | ORAL | Status: DC | PRN
  Administered 2013-05-08: 5 mg via ORAL
  Filled 2013-05-06: qty 1

## 2013-05-06 MED ORDER — ACETAMINOPHEN 650 MG RE SUPP: 650.0000 mg | Freq: Four times a day (QID) | RECTAL | Status: DC | PRN

## 2013-05-06 MED ORDER — MONTELUKAST SODIUM 10 MG PO TABS
10.0000 mg | ORAL_TABLET | Freq: Every day | ORAL | Status: DC | PRN
  Filled 2013-05-06: qty 1

## 2013-05-06 MED ORDER — GUAIFENESIN-DM 100-10 MG/5ML PO SYRP: 5.0000 mL | ORAL_SOLUTION | ORAL | Status: DC | PRN

## 2013-05-06 MED ORDER — LEVALBUTEROL HCL 0.63 MG/3ML IN NEBU
0.6300 mg | INHALATION_SOLUTION | Freq: Four times a day (QID) | RESPIRATORY_TRACT | Status: DC | PRN
  Administered 2013-05-06 – 2013-05-10 (×4): 0.63 mg via RESPIRATORY_TRACT
  Filled 2013-05-06 (×4): qty 3

## 2013-05-06 MED ORDER — ATORVASTATIN CALCIUM 20 MG PO TABS
20.0000 mg | ORAL_TABLET | Freq: Every day | ORAL | Status: DC
  Administered 2013-05-06 – 2013-05-23 (×18): 20 mg via ORAL
  Filled 2013-05-06 (×19): qty 1

## 2013-05-06 MED ORDER — SENNA 8.6 MG PO TABS
1.0000 | ORAL_TABLET | Freq: Every day | ORAL | Status: DC
  Administered 2013-05-06 – 2013-05-22 (×17): 8.6 mg via ORAL
  Filled 2013-05-06 (×17): qty 1

## 2013-05-06 MED ORDER — DIGOXIN 0.25 MG/ML IJ SOLN
INTRAMUSCULAR | Status: AC
  Administered 2013-05-06: 250 ug
  Filled 2013-05-06: qty 2

## 2013-05-06 MED ORDER — ONDANSETRON HCL 4 MG PO TABS: 4.0000 mg | ORAL_TABLET | Freq: Four times a day (QID) | ORAL | Status: DC | PRN

## 2013-05-06 MED ORDER — LEVOFLOXACIN IN D5W 750 MG/150ML IV SOLN
750.0000 mg | INTRAVENOUS | Status: DC
  Administered 2013-05-07 – 2013-05-13 (×6): 750 mg via INTRAVENOUS
  Filled 2013-05-06 (×10): qty 150

## 2013-05-06 NOTE — Progress Notes (Addendum)
Pt has converted to nsr. See strip in shadow chart. Dr [name] notified.

## 2013-05-06 NOTE — Progress Notes (Signed)
*  PRELIMINARY RESULTS* Echocardiogram 2D Echocardiogram has been performed.  Megan Mcknight 05/06/2013, 3:20 PM

## 2013-05-06 NOTE — ED Provider Notes (Signed)
History  This chart was scribed for Benny Lennert, MD by Ardelia Mems, ED Scribe. This patient was seen in room [facility]04[facility]04 and the patient's care was started at 9:44 AM.   CSN: [id number]  Arrival date & time 05/06/13  0930     Chief Complaint  Patient presents with  . Shortness of Breath     Patient is a 52 y.o. female presenting with shortness of breath. The history is provided by the patient. No language interpreter was used.  Shortness of Breath Severity:  Moderate Onset quality:  Sudden Duration:  2 hours Timing:  Constant Chronicity:  New Context: URI (bronchitis)   Associated symptoms: no abdominal pain, no chest pain, no cough, no headaches and no rash   Risk factors: alcohol use and tobacco use     HPI Comments: Megan Mcknight is a 52 y.o. Female, diagnosed with bronchitis last week, who presents to the Emergency Department complaining of constant, moderate shortness of breath that began this morning while at work. There is associated sweating and generalized weakness. 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 Pt states that she has no significant PMH but that her mother has heart problems and is on a defibrillator. Pt denies HA, chills, diarrhea, emesis, injury or any other symptoms. Pt smokes 1/2 pack/day and uses alcohol.  PCP- Dr. Felecia Shelling  Past Medical History  Diagnosis Date  . Hyperlipidemia   . Nerve pain   . Hyperlipemia     Past Surgical History  Procedure Laterality Date  . Partial hysterectomy    . Bones removed from baby toes    . Colonoscopy  02/16/2012    Procedure: COLONOSCOPY;  Surgeon: Corbin Ade, MD;  Location: [facility];  Service: Endoscopy;  Laterality: N/A;  1:30  . Abdominal hysterectomy      Family History  Problem Relation Age of Onset  . Heart defect      family history   . Asthma      family history   . Colon polyps Mother     History  Substance Use Topics  . Smoking status:  Current Every Day Smoker -- 0.50 packs/day    Types: Cigarettes  . Smokeless tobacco: Not on file  . Alcohol Use: Yes     Comment: one glass every three weeks    OB History   Grav Para Term Preterm Abortions TAB SAB Ect Mult Living                  Review of Systems  Constitutional: Negative for appetite change and fatigue.  HENT: Negative for congestion, sinus pressure and ear discharge.   Eyes: Negative for discharge.  Respiratory: Positive for shortness of breath. Negative for cough.   Cardiovascular: Negative for chest pain.  Gastrointestinal: Negative for abdominal pain and diarrhea.  Genitourinary: Negative for frequency and hematuria.  Musculoskeletal: Negative for back pain.  Skin: Negative for rash.  Neurological: Negative for seizures and headaches.  Psychiatric/Behavioral: Negative for hallucinations.    Allergies  Iodine and Shellfish-derived products  Home Medications   Current Outpatient Rx  Name  Route  Sig  Dispense  Refill  . diazepam (VALIUM) 5 MG tablet   Oral   Take 1 tablet (5 mg total) by mouth 2 (two) times daily.   10 tablet   0   . ibuprofen (ADVIL,MOTRIN) 200 MG tablet   Oral   Take 400 mg by mouth every 6 (six) hours  as needed for pain.         Marland Kitchen ibuprofen (ADVIL,MOTRIN) 600 MG tablet   Oral   Take 1 tablet (600 mg total) by mouth every 6 (six) hours as needed for pain.   30 tablet   0   . montelukast (SINGULAIR) 10 MG tablet   Oral   Take 10 mg by mouth at bedtime as needed (Allergies).          . naproxen (NAPROSYN) 500 MG tablet   Oral   Take 1 tablet (500 mg total) by mouth 2 (two) times daily.   20 tablet   0   . OVER THE COUNTER MEDICATION   Oral   Take 1 tablet by mouth daily as needed. Green Bean Coffee Extract Supplement.         Marland Kitchen oxyCODONE-acetaminophen (PERCOCET/ROXICET) 5-325 MG per tablet   Oral   Take 1 tablet by mouth every 4 (four) hours as needed.   11 tablet   0   . rosuvastatin (CRESTOR) 10 MG  tablet   Oral   Take 10 mg by mouth daily.           Triage Vitals: BP 108/63  Pulse 90  Temp(Src) 98.1 F (36.7 C) (Oral)  Resp 20  Ht 5\' 8"  (1.727 m)  Wt 230 lb (104.327 kg)  BMI 34.98 kg/m2  SpO2 92%  Physical Exam  Constitutional: She is oriented to person, place, and time. She appears well-developed.  HENT:  Head: Normocephalic.  Eyes: Conjunctivae and EOM are normal. No scleral icterus.  Neck: Neck supple. No thyromegaly present.  Cardiovascular: Exam reveals no gallop and no friction rub.   No murmur heard. Rapid, irregular heat rate.  Pulmonary/Chest: No stridor. She has no wheezes. She has no rales. She exhibits no tenderness.  Abdominal: She exhibits no distension. There is no tenderness. There is no rebound.  Musculoskeletal: Normal range of motion. She exhibits no edema.  Lymphadenopathy:    She has no cervical adenopathy.  Neurological: She is oriented to person, place, and time. Coordination normal.  Skin: No rash noted. No erythema.  Psychiatric: She has a normal mood and affect. Her behavior is normal.    ED Course  Procedures (including critical care time)  DIAGNOSTIC STUDIES: Oxygen Saturation is 92% on RA, low by my interpretation.    COORDINATION OF CARE: 9:55 AM- Pt advised of plan for treatment and pt agrees.  10:31 AM- Pt recheck and pt advised of further plan for treatment.  10:59 AM- Pt recheck- lab results discussed as well as plan to admit.   Medications  diltiazem (CARDIZEM) 100 mg in dextrose 5 % 100 mL infusion (10 mg/hr Intravenous Rate/Dose Change 05/06/13 1021)  diltiazem (CARDIZEM) injection 10 mg (0 mg Intravenous Stopped 05/06/13 1006)  sodium chloride 0.9 % bolus 1,000 mL (1,000 mLs Intravenous New Bag/Given 05/06/13 1021)      Labs Reviewed  CBC WITH DIFFERENTIAL - Abnormal; Notable for the following:    WBC 19.3 (*)    Hemoglobin 11.9 (*)    Neutro Abs 14.7 (*)    All other components within normal limits   COMPREHENSIVE METABOLIC PANEL  TROPONIN I   No results found.   No diagnosis found.  CRITICAL CARE Performed by: Horatio Bertz L Total critical care time: 45 Critical care time was exclusive of separately billable procedures and treating other patients. Critical care was necessary to treat or prevent imminent or life-threatening deterioration. Critical care was time spent personally by  me on the following activities: development of treatment plan with patient and/or surrogate as well as nursing, discussions with consultants, evaluation of patient's response to treatment, examination of patient, obtaining history from patient or surrogate, ordering and performing treatments and interventions, ordering and review of laboratory studies, ordering and review of radiographic studies, pulse oximetry and re-evaluation of patient's condition.   Date: 05/06/2013  Rate: 166  Rhythm: atrial fibrillation  QRS Axis: normal  Intervals: normal  ST/T Wave abnormalities: nonspecific ST changes  Conduction Disutrbances:none  Narrative Interpretation:   Old EKG Reviewed: none available   MDM          The chart was scribed for me under my direct supervision.  I personally performed the history, physical, and medical decision making and all procedures in the evaluation of this patient.Benny Lennert, MD 05/06/13 [phone]

## 2013-05-06 NOTE — ED Notes (Signed)
SOB began this morning while at work. Pt is diaphoretic. Denies pain. Dx with bronchitis last week.

## 2013-05-06 NOTE — Progress Notes (Signed)
ANTIBIOTIC CONSULT NOTE - INITIAL  Pharmacy Consult for Vancomycin Indication: rule out pneumonia  Allergies  Allergen Reactions  . Iodine Anaphylaxis    Told to avoid due to shellfish allergy.  Marland Kitchen Shellfish-Derived Products Anaphylaxis and Swelling    Eyes swelling, scratchy throat, bumps on lips.   Patient Measurements: Height: 5\' 8"  (172.7 cm) Weight: 220 lb 14.4 oz (100.2 kg) IBW/kg (Calculated) : 63.9  Vital Signs: Temp: 97.6 F (36.4 C) (05/23 1227) Temp src: Oral (05/23 1227) BP: 136/72 mmHg (05/23 1107) Pulse Rate: 131 (05/23 1227) Intake/Output from previous day:   Intake/Output from this shift:    Labs:  Recent Labs  05/06/13 1009  WBC 19.3*  HGB 11.9*  PLT 386  CREATININE 1.54*   Estimated Creatinine Clearance: 53.5 ml/min (by C-G formula based on Cr of 1.54). No results found for this basename: VANCOTROUGH, VANCOPEAK, VANCORANDOM, GENTTROUGH, GENTPEAK, GENTRANDOM, TOBRATROUGH, TOBRAPEAK, TOBRARND, AMIKACINPEAK, AMIKACINTROU, AMIKACIN,  in the last 72 hours   Microbiology: No results found for this or any previous visit (from the past 720 hour(s)).  Medical History: Past Medical History  Diagnosis Date  . Hyperlipidemia   . Nerve pain     Left leg  . Seasonal allergies   . Acute bronchitis   . Colon polyps 02/2013    Per colonoscopy  . Diverticulosis 02/2013    Per colonoscopy  . Former light tobacco smoker    Medications:  Scheduled:  . atorvastatin  20 mg Oral q1800  . benzonatate  100 mg Oral TID  . digoxin      . enoxaparin (LOVENOX) injection  40 mg Subcutaneous Q24H  . levofloxacin (LEVAQUIN) IV  750 mg Intravenous Once  . [START ON 05/07/2013] levofloxacin (LEVAQUIN) IV  750 mg Intravenous Q24H  . senna  1 tablet Oral QHS  . vancomycin  1,000 mg Intravenous Q12H   Assessment: 52yo female admitted with SOB and suspected pna.  Pt has elevated WBC and elevated SCR.  Estimated Creatinine Clearance: 53.5 ml/min (by C-G formula based on Cr  of 1.54).  Goal of Therapy:  Vancomycin trough level 15-20 mcg/ml  Plan:  Vancomycin 1gm IV q12hrs Check trough at steady state F/U SCr tomorrow Monitor labs, renal fxn, and cultures Duration of therapy per MD  Megan Mcknight A 05/06/2013,12:48 PM

## 2013-05-06 NOTE — ED Notes (Signed)
RN at bedside

## 2013-05-06 NOTE — Consult Note (Signed)
CARDIOLOGY CONSULT NOTE  Patient ID: Megan Mcknight MRN: [id number] DOB/AGE: 1961/09/14 52 y.o.  Admit date: 05/06/2013 Referring Physician: PTH: Janace Aris, MD Primary Cardiologist(New) [facility] Reason for Consultation: New Onset Atrial fibrillation  Principal Problem:   Shortness of breath Active Problems:   Atrial fibrillation with RVR   Bilateral pneumonia   Acute renal insufficiency   Normocytic anemia  HPI: Megan Mcknight is a 52 y/o patient with no prior cardiac history, admitted with Atrial fib with RVR, dyspnea, acute bronchitis and diaphoresis. EKG demonstrated a rate of 166 bpm. CXR with moderate interstitial edema or atypical infection. She was treated with diltiazem bolus, and started on diltiazem gtt. She is also being treated with antibiotics (Levaquin).     She states symptoms of shortness of breath and cough began about 2 weeks ago. She did notice at that time that she had a rapid heart rate that subsided on its own after lying down and falling asleep. She is acutely aware of her heart rate when it is irregular or rapid. The patient states she has been very tired lately as she has been trying to recover from bronchitis as an outpatient with by mouth inhalers and a Z-Pak. She states she gets to work and comes and goes right to bed. She does not feel as if she has rested by the time she awakens.      On evaluation, the patient is already converted to normal sinus rhythm around 1:30 PM this afternoon. She states she immediately began to feel better with her heart rhythm returned to normal but continues cough and congestion.  Review of systems complete and found to be negative unless listed above   Past Medical History  Diagnosis Date  . Hyperlipidemia   . Nerve pain     Left leg  . Seasonal allergies   . Acute bronchitis   . Colon polyps 02/2013    Per colonoscopy  . Diverticulosis 02/2013    Per colonoscopy  . Former light tobacco smoker      Family History  Problem Relation Age of Onset  . Heart defect      family history   . Asthma      family history   . Colon polyps Mother     History   Social History  . Marital Status: Divorced    Spouse Name: N/A    Number of Children: 3  . Years of Education: N/A   Occupational History  . technician    .     Social History Main Topics  . Smoking status: Current Every Day Smoker -- 0.50 packs/day    Types: Cigarettes  . Smokeless tobacco: Not on file  . Alcohol Use: Yes     Comment: one glass every three weeks  . Drug Use: No  . Sexually Active: Not on file   Other Topics Concern  . Not on file   Social History Narrative  . No narrative on file    Past Surgical History  Procedure Laterality Date  . Partial hysterectomy    . Bones removed from baby toes    . Colonoscopy  02/16/2012    Procedure: COLONOSCOPY;  Surgeon: Corbin Ade, MD;  Location: [facility];  Service: Endoscopy;  Laterality: N/A;  1:30  . Abdominal hysterectomy       Prescriptions prior to admission  Medication Sig Dispense Refill  . albuterol (PROVENTIL HFA;VENTOLIN HFA) 108 (90 BASE) MCG/ACT inhaler Inhale 2 puffs into the  lungs every 6 (six) hours as needed for wheezing.      Marland Kitchen ibuprofen (ADVIL,MOTRIN) 200 MG tablet Take 400 mg by mouth every 6 (six) hours as needed for pain.      . montelukast (SINGULAIR) 10 MG tablet Take 10 mg by mouth daily as needed (Allergies).       . rosuvastatin (CRESTOR) 10 MG tablet Take 10 mg by mouth daily.       Physical Exam: Blood pressure 136/72, pulse 131, temperature 97.6 F (36.4 C), temperature source Oral, resp. rate 18, height 5\' 8"  (1.727 m), weight 220 lb 14.4 oz (100.2 kg), SpO2 94.00%.   General: Well developed, well nourished, in no acute distress Head: Eyes PERRLA, No xanthomas.   Normal cephalic and atramatic  Lungs: Bilateral crackles, with expiratory wheezes, and frequent coughing. Heart: HRRR S1 S2, without MRG.  Pulses are 2+ &  equal.            No carotid bruit. No JVD.  No abdominal bruits. No femoral bruits. Abdomen: Bowel sounds are positive, abdomen soft and non-tender without masses. Msk:  Back normal, normal gait. Normal strength and tone for age. Extremities: No clubbing, cyanosis or edema.  DP +1 Neuro: Alert and oriented X 3.  Lab Results  Component Value Date   WBC 19.3* 05/06/2013   HGB 11.9* 05/06/2013   HCT 37.0 05/06/2013   MCV 81.7 05/06/2013   PLT 386 05/06/2013    Recent Labs Lab 05/06/13 1009  NA 140  K 3.6  CL 99  CO2 24  BUN 14  CREATININE 1.54*  CALCIUM 9.7  PROT 8.3  BILITOT 0.4  ALKPHOS 97  ALT 26  AST 17  GLUCOSE 129*    Recent Labs  05/06/13 1059  PROBNP 15.8   Radiology: Dg Chest Port 1 View  05/06/2013   *RADIOLOGY REPORT*  Clinical Data: Shortness of breath, acute bronchitis  PORTABLE CHEST - 1 VIEW  Comparison: 03/09/2013  Findings: Diffuse reticulonodular opacities bilaterally, possibly reflecting mild to moderate interstitial edema or atypical infection. No pleural effusion or pneumothorax.  Mild cardiomegaly.  IMPRESSION: Diffuse reticulonodular opacities bilaterally, possibly reflecting mild to moderate interstitial edema or atypical infection.   Original Report Authenticated By: Charline Bills, M.D.   NUU:VOZDGU fib with RVR rate of 166 bpm. Telemetry: NSR 80 bpm (Converted to NSR at 13:30)  ASSESSMENT AND PLAN:   1.Atrial fibrillation with RVR: Most likely related to acute illness in the setting of bronchitis and hypoxia. She has converted nicely to normal sinus rhythm after institution of IV Cardizem bolus and drip. Blood pressure is mildly hypotensive running between 90 systolic in the 136 systolic. We will place her on oral Cardizem 30 mg every 8 hours and discontinue drip.CHADS score 0. Would not place on anticoagulation at this time. May consider outpatient sleep study to evaluate for sleep apnea as possible source as well. Right now  concentration on  recovery from pneumonia. Echocardiogram for LV fx.TSH is ordered.  2. Acute Bronchitis: Currently being treated with IV Levaquin, expectorants and decongestants.  3.Hypercholesterolemia: Patient is currently being treated for this with Lipitor 20 mg daily. As this is a cardiovascular risk factor, would continue low-cholesterol diet and ongoing labs.  Bettey Mare. Lyman Bishop NP [facility] 05/06/2013, 12:50 PM  Cardiology Attending Patient interviewed and examined. Discussed with Joni Reining, NP.  Above note annotated and modified based upon my findings.  Apparent new onset of atrial fibrillation in the setting of acute respiratory illness.  Risk of thromboembolism is low. Agree with treatment with an AV nodal blocking agent to prevent rapid ventricular response should arrhythmia recur in hospital. Once she recovers from pneumonia, no further cardiac evaluation or treatment should be necessary. She is asked to return to the office as an outpatient if she experiences additional cardiac symptoms. If she is to be discharged on diltiazem, a course of 1-2 weeks should be adequate.  [place] [facility], MD 05/06/2013, 5:26 PM

## 2013-05-06 NOTE — H&P (Signed)
[facility] History and Physical  CANYON WILLOW ZOX:[id number] DOB: 01-01-1961 DOA: 05/06/2013  Referring physician: Dr. Estell Harpin PCP: [name], MD  Specialists:   Chief Complaint: Shortness of breath.  HPI: Megan Mcknight is a 52 y.o. female with a past medical history significant for seasonal allergies, left lower extremity nerve pain, and hyperlipidemia. She was recently treated with a Z-Pak for acute bronchitis last week. She presents with a three-day history of worsening shortness of breath, pleurisy, and a dry hacking cough. She has also had bilateral chest tightness with palpitations and heart fluttering. She had lightheadedness once today. She has had subjective fever, but no chills. She has had generalized malaise. She denies orthopnea, swelling in her legs, or radiation of the chest pain to her left arm or her jaw. She denies associated abdominal pain, nausea, vomiting, or diarrhea. She occasionally has a small amount of blood in her stools which she says is from chronic hemorrhoids.  In the emergency department, she is noted to be in rapid atrial fibrillation with a heart rate ranging from 140-160 beats per minute. Her EKG reveals atrial fibrillation with a heart rate of 166 beats per minute and ST and T wave abnormalities. Her chest x-ray reveals diffuse reticulonodular opacities bilaterally. Her lab data are significant for a creatinine of 1.54, WBC of 19.3, and hemoglobin of 11.9. Her pro BNP is only 15.8. Her troponin I is negative x1. She is being admitted for further evaluation and management.  Review of Systems: As above in history present illness. Also, the patient has noted palpitations and heart fluttering for almost a year. Otherwise, negative.  Past Medical History  Diagnosis Date  . Hyperlipidemia   . Nerve pain     Left leg  . Seasonal allergies   . Acute bronchitis   . Colon polyps 02/2013    Per colonoscopy  . Diverticulosis 02/2013    Per colonoscopy  .  Former light tobacco smoker    Past Surgical History  Procedure Laterality Date  . Partial hysterectomy    . Bones removed from baby toes    . Colonoscopy  02/16/2012    Procedure: COLONOSCOPY;  Surgeon: Corbin Ade, MD;  Location: [facility];  Service: Endoscopy;  Laterality: N/A;  1:30  . Abdominal hysterectomy     Social History: She is married. She is employed. She has 3 children. She stopped smoking 4 weeks ago. She drinks alcohol only on occasion. She denies illicit drug use.    Allergies  Allergen Reactions  . Iodine Anaphylaxis    Told to avoid due to shellfish allergy.  Marland Kitchen Shellfish-Derived Products Anaphylaxis and Swelling    Eyes swelling, scratchy throat, bumps on lips.    Family History  Problem Relation Age of Onset  . Heart defect      family history   . Asthma      family history   . Colon polyps Mother    Family history: Her mother is 66 years of age and has cardiomyopathy and coronary artery disease. Her father is 22 years of age and has a history of prostate cancer.   Prior to Admission medications   Medication Sig Start Date End Date Taking? Authorizing Provider  albuterol (PROVENTIL HFA;VENTOLIN HFA) 108 (90 BASE) MCG/ACT inhaler Inhale 2 puffs into the lungs every 6 (six) hours as needed for wheezing.   Yes Historical Provider, MD  ibuprofen (ADVIL,MOTRIN) 200 MG tablet Take 400 mg by mouth every 6 (six) hours as needed  for pain.   Yes Historical Provider, MD  montelukast (SINGULAIR) 10 MG tablet Take 10 mg by mouth daily as needed (Allergies).    Yes Historical Provider, MD  rosuvastatin (CRESTOR) 10 MG tablet Take 10 mg by mouth daily.   Yes Historical Provider, MD   Physical Exam: Filed Vitals:   05/06/13 1034 05/06/13 1044 05/06/13 1107 05/06/13 1227  BP: 101/68 111/72 136/72   Pulse: 154 143 138 131  Temp:    97.6 F (36.4 C)  TempSrc:    Oral  Resp: 20 20 18    Height:    5\' 8"  (1.727 m)  Weight:    100.2 kg (220 lb 14.4 oz)  SpO2: 93%  99% 97% 94%     General:  Pleasant alert 52 year old African-American woman who is lying in bed, slightly anxious.  Eyes: Pupils equal, round, and reactive to light. Extraocular movements are intact. Conjunctivae are clear. Sclerae are white.  ENT: Oropharynx reveals mildly dry mucous membranes. No posterior exudates or erythema.  Neck: Query mild thyromegaly. No JVD. No bruit. No adenopathy.  Cardiovascular: Irregular, irregular, with tachycardia.  Respiratory: Decreased breath sounds in the bases with a few bilateral crackles. Breathing is nonlabored at rest.  Abdomen: Mildly obese, positive bowel sounds, soft, nontender, nondistended.  Skin: Good turgor. No rashes.  Musculoskeletal: No acute hot joints. Pedal pulses palpable. No pedal edema.  Psychiatric: Slightly anxious but pleasant. Her speech is clear.  Neurologic: Alert and oriented x3. Cranial nerves II through XII are intact. Strength is 5 over 5 throughout. Sensation is intact.  Labs on Admission:  Basic Metabolic Panel:  Recent Labs Lab 05/06/13 1009  NA 140  K 3.6  CL 99  CO2 24  GLUCOSE 129*  BUN 14  CREATININE 1.54*  CALCIUM 9.7   Liver Function Tests:  Recent Labs Lab 05/06/13 1009  AST 17  ALT 26  ALKPHOS 97  BILITOT 0.4  PROT 8.3  ALBUMIN 3.2*   No results found for this basename: LIPASE, AMYLASE,  in the last 168 hours No results found for this basename: AMMONIA,  in the last 168 hours CBC:  Recent Labs Lab 05/06/13 1009  WBC 19.3*  NEUTROABS 14.7*  HGB 11.9*  HCT 37.0  MCV 81.7  PLT 386   Cardiac Enzymes:  Recent Labs Lab 05/06/13 1009  TROPONINI <0.30    BNP (last 3 results)  Recent Labs  05/06/13 1059  PROBNP 15.8   CBG: No results found for this basename: GLUCAP,  in the last 168 hours  Radiological Exams on Admission: Dg Chest Port 1 View  05/06/2013   *RADIOLOGY REPORT*  Clinical Data: Shortness of breath, acute bronchitis  PORTABLE CHEST - 1 VIEW   Comparison: 03/09/2013  Findings: Diffuse reticulonodular opacities bilaterally, possibly reflecting mild to moderate interstitial edema or atypical infection. No pleural effusion or pneumothorax.  Mild cardiomegaly.  IMPRESSION: Diffuse reticulonodular opacities bilaterally, possibly reflecting mild to moderate interstitial edema or atypical infection.   Original Report Authenticated By: Charline Bills, M.D.    EKG: Independently reviewed. As above in history of present illness.  Assessment/Plan Principal Problem:   Shortness of breath Active Problems:   Atrial fibrillation with RVR   Bilateral pneumonia   Acute renal insufficiency   Normocytic anemia   1. This is a pleasant 52 year old woman who presents with shortness of breath. Her shortness of breath is secondary to both atrial fibrillation with rapid ventricular response and bilateral pneumonia. She is afebrile, but her white blood  cell count is elevated at 19.5. There was a question about whether her chest x-ray represented edema. Her pro BNP is less than 20. Clinically, she does not appear to be in congestive heart failure. This is more than likely the consequence of community-acquired pneumonia. Her atrial fibrillation may be newly onset or subacute given her symptomatic history of palpitations. Her pneumonia may have precipitated atrial fibrillation with rapid ventricular response. She has renal insufficiency, which is likely acute and secondary to hypovolemia/mild dehydration.      Plan: 1. We'll continue diltiazem drip was started in the emergency department. We'll titrate up to 20 mg per hour if needed. She was given digoxin 0.25 mg x1. We'll continue to monitor and adjust medications accordingly. 2. We'll consult [facility] for further evaluation and recommendations. 3. We'll continue Levaquin started for pneumonia. We'll add vancomycin empirically. 4. We'll add supportive treatment with oxygen, as needed Xopenex  nebulizers, Tessalon Perles, et [employer]. 5. Will start gentle IV fluids. 6. For further evaluation, we'll order a 2-D echocardiogram, cardiac enzymes, magnesium, phosphorus, TSH/free T4. 7. We'll check a urine Legionella antigen and strep pneumo antigen.    Code Status: Full code Family Communication: No family available Disposition Plan: Anticipate discharge to home in 3-4 days.  Time spent: Critical care time: One hour  [facility] Pager [phone]  If 7PM-7AM, please contact night-coverage [email or web address] Password [facility] 05/06/2013, 12:52 PM

## 2013-05-07 LAB — CBC
HCT: 30.3 % — ABNORMAL LOW (ref 36.0–46.0)
Hemoglobin: 9.8 g/dL — ABNORMAL LOW (ref 12.0–15.0)
MCH: 26.3 pg (ref 26.0–34.0)
MCHC: 32.3 g/dL (ref 30.0–36.0)
MCV: 81.2 fL (ref 78.0–100.0)
Platelets: 315 10*3/uL (ref 150–400)
RBC: 3.73 MIL/uL — ABNORMAL LOW (ref 3.87–5.11)
RDW: 13.4 % (ref 11.5–15.5)
WBC: 13.3 10*3/uL — ABNORMAL HIGH (ref 4.0–10.5)

## 2013-05-07 LAB — BASIC METABOLIC PANEL
BUN: 7 mg/dL (ref 6–23)
CO2: 24 mEq/L (ref 19–32)
Calcium: 8.7 mg/dL (ref 8.4–10.5)
Chloride: 102 mEq/L (ref 96–112)
Creatinine, Ser: 0.92 mg/dL (ref 0.50–1.10)
GFR calc Af Amer: 82 mL/min — ABNORMAL LOW (ref >90)
GFR calc non Af Amer: 71 mL/min — ABNORMAL LOW (ref >90)
Glucose, Bld: 135 mg/dL — ABNORMAL HIGH (ref 70–99)
Potassium: 4 mEq/L (ref 3.5–5.1)
Sodium: 136 mEq/L (ref 135–145)

## 2013-05-07 LAB — TSH: TSH: 2.859 u[IU]/mL (ref 0.350–4.500)

## 2013-05-07 LAB — TROPONIN I: Troponin I: <0.3 ng/mL (ref <0.30)

## 2013-05-07 LAB — T4, FREE: Free T4: 0.93 ng/dL (ref 0.80–1.80)

## 2013-05-07 LAB — LEGIONELLA ANTIGEN, URINE: Legionella Antigen, Urine: NEGATIVE

## 2013-05-07 NOTE — Progress Notes (Signed)
Subjective: Patient was admitted yesterday for pneumonia and atrial fibrillation. She is started on combination IV antibiotics and cardiac drip. Her heart rate is controlled and her rhythm is changed sinus. No fever or chills. Patient is feeling better.  Objective: Vital signs in last 24 hours: Temp:  [97.6 F (36.4 C)-100 F (37.8 C)] 99.4 F (37.4 C) (05/24 0452) Pulse Rate:  [64-154] 94 (05/24 0318) Resp:  [13-33] 26 (05/24 0600) BP: (93-139)/(51-102) 114/66 mmHg (05/24 0616) SpO2:  [0 %-99 %] 97 % (05/24 0318) Weight:  [100.2 kg (220 lb 14.4 oz)-104.327 kg (230 lb)] 101.6 kg (223 lb 15.8 oz) (05/24 0452) Weight change:  Last BM Date: 05/06/13  Intake/Output from previous day: 05/23 0701 - 05/24 0700 In: 2640 [P.O.:360; I.V.:1780; IV Piggyback:500] Out: 1550 [Urine:1550]  PHYSICAL EXAM General appearance: alert and no distress Resp: diminished breath sounds bilaterally and rhonchi bilaterally Cardio: S1, S2 normal GI: soft, non-tender; bowel sounds normal; no masses,  no organomegaly Extremities: extremities normal, atraumatic, no cyanosis or edema  Lab Results:    @labtest @ [facility] No results found for this basename: PHART, PCO2, PO2ART, TCO2, HCO3,  in the last 72 hours CULTURES Recent Results (from the past 240 hour(s))  MRSA PCR SCREENING     Status: None   Collection Time    05/06/13 12:04 PM      Result Value Range Status   MRSA by PCR NEGATIVE  NEGATIVE Final   Comment:            The GeneXpert MRSA Assay (FDA     approved for NASAL specimens     only), is one component of a     comprehensive MRSA colonization     surveillance program. It is not     intended to diagnose MRSA     infection nor to guide or     monitor treatment for     MRSA infections.   Studies/Results: Dg Chest Port 1 View  05/06/2013   *RADIOLOGY REPORT*  Clinical Data: Shortness of breath, acute bronchitis  PORTABLE CHEST - 1 VIEW  Comparison: 03/09/2013  Findings: Diffuse  reticulonodular opacities bilaterally, possibly reflecting mild to moderate interstitial edema or atypical infection. No pleural effusion or pneumothorax.  Mild cardiomegaly.  IMPRESSION: Diffuse reticulonodular opacities bilaterally, possibly reflecting mild to moderate interstitial edema or atypical infection.   Original Report Authenticated By: Charline Bills, M.D.    Medications: I have reviewed the patient's current medications.  Assesment: Principal Problem:   Shortness of breath Active Problems:   Atrial fibrillation with RVR   Bilateral pneumonia   Acute renal insufficiency   Normocytic anemia    Plan: Continue Iv antibiotics Continue telemetry CBC/BMP in AM  Continue regular treatment    LOS: 1 day   Taite Schoeppner 05/07/2013, 8:10 AM

## 2013-05-07 NOTE — Progress Notes (Signed)
Pt transferred to 3rd floor. Report given to Dagoberto Ligas RN. Vital signs stable at transfer.

## 2013-05-08 LAB — CBC
HCT: 29.7 % — ABNORMAL LOW (ref 36.0–46.0)
Hemoglobin: 9.5 g/dL — ABNORMAL LOW (ref 12.0–15.0)
MCH: 25.9 pg — ABNORMAL LOW (ref 26.0–34.0)
MCHC: 32 g/dL (ref 30.0–36.0)
MCV: 80.9 fL (ref 78.0–100.0)
Platelets: 288 10*3/uL (ref 150–400)
RBC: 3.67 MIL/uL — ABNORMAL LOW (ref 3.87–5.11)
RDW: 13.4 % (ref 11.5–15.5)
WBC: 14 10*3/uL — ABNORMAL HIGH (ref 4.0–10.5)

## 2013-05-08 LAB — BASIC METABOLIC PANEL
BUN: 7 mg/dL (ref 6–23)
CO2: 27 mEq/L (ref 19–32)
Calcium: 8.9 mg/dL (ref 8.4–10.5)
Chloride: 105 mEq/L (ref 96–112)
Creatinine, Ser: 0.81 mg/dL (ref 0.50–1.10)
GFR calc Af Amer: >90 mL/min (ref >90)
GFR calc non Af Amer: 83 mL/min — ABNORMAL LOW (ref >90)
Glucose, Bld: 100 mg/dL — ABNORMAL HIGH (ref 70–99)
Potassium: 3.9 mEq/L (ref 3.5–5.1)
Sodium: 138 mEq/L (ref 135–145)

## 2013-05-08 LAB — STREP PNEUMONIAE URINARY ANTIGEN: Strep Pneumo Urinary Antigen: NEGATIVE

## 2013-05-08 LAB — VANCOMYCIN, TROUGH: Vancomycin Tr: 5 ug/mL — ABNORMAL LOW (ref 10.0–20.0)

## 2013-05-08 MED ORDER — VANCOMYCIN HCL 10 G IV SOLR
1250.0000 mg | Freq: Two times a day (BID) | INTRAVENOUS | Status: DC
  Administered 2013-05-08 – 2013-05-10 (×4): 1250 mg via INTRAVENOUS
  Filled 2013-05-08 (×7): qty 1250

## 2013-05-08 MED ORDER — IBUPROFEN 800 MG PO TABS
400.0000 mg | ORAL_TABLET | ORAL | Status: DC | PRN
Start: 2013-05-08 — End: 2013-05-24
  Administered 2013-05-08 – 2013-05-18 (×7): 400 mg via ORAL
  Filled 2013-05-08 (×7): qty 1

## 2013-05-08 NOTE — Progress Notes (Signed)
ANTIBIOTIC CONSULT NOTE   Pharmacy Consult for Vancomycin Indication: rule out pneumonia  Allergies  Allergen Reactions  . Iodine Anaphylaxis    Told to avoid due to shellfish allergy.  Marland Kitchen Shellfish-Derived Products Anaphylaxis and Swelling    Eyes swelling, scratchy throat, bumps on lips.   Patient Measurements: Height: 5\' 8"  (172.7 cm) Weight: 223 lb 15.8 oz (101.6 kg) IBW/kg (Calculated) : 63.9  Vital Signs: Temp: 99.5 F (37.5 C) (05/25 0628) Temp src: Oral (05/25 0628) BP: 112/70 mmHg (05/25 0628) Pulse Rate: 90 (05/25 0628) Intake/Output from previous day: 05/24 0701 - 05/25 0700 In: 1650 [I.V.:1100; IV Piggyback:550] Out: 3300 [Urine:3300] Intake/Output from this shift: Total I/O In: -  Out: 500 [Urine:500]  Labs:  Recent Labs  05/06/13 1009 05/07/13 0519 05/08/13 0810  WBC 19.3* 13.3* 14.0*  HGB 11.9* 9.8* 9.5*  PLT 386 315 288  CREATININE 1.54* 0.92 0.81   Estimated Creatinine Clearance: 102.5 ml/min (by C-G formula based on Cr of 0.81).  Recent Labs  05/08/13 1200  VANCOTROUGH 5.0*    Microbiology: Recent Results (from the past 720 hour(s))  MRSA PCR SCREENING     Status: None   Collection Time    05/06/13 12:04 PM      Result Value Range Status   MRSA by PCR NEGATIVE  NEGATIVE Final   Comment:            The GeneXpert MRSA Assay (FDA     approved for NASAL specimens     only), is one component of a     comprehensive MRSA colonization     surveillance program. It is not     intended to diagnose MRSA     infection nor to guide or     monitor treatment for     MRSA infections.   Medical History: Past Medical History  Diagnosis Date  . Hyperlipidemia   . Nerve pain     Left leg  . Seasonal allergies   . Acute bronchitis   . Colon polyps 02/2013    Per colonoscopy  . Diverticulosis 02/2013    Per colonoscopy  . Former light tobacco smoker    Medications:  Scheduled:  . atorvastatin  20 mg Oral q1800  . benzonatate  100 mg Oral  TID  . diltiazem  60 mg Oral Q8H  . enoxaparin (LOVENOX) injection  40 mg Subcutaneous Q24H  . hydrocortisone   Rectal QHS  . levofloxacin (LEVAQUIN) IV  750 mg Intravenous Q24H  . senna  1 tablet Oral QHS  . vancomycin  1,000 mg Intravenous Q12H   Assessment: 52yo female admitted with SOB and suspected pna.   SCr has improved.  Trough level is below goal.    Estimated Creatinine Clearance: 102.5 ml/min (by C-G formula based on Cr of 0.81).  Goal of Therapy:  Vancomycin trough level 15-20 mcg/ml  Plan:  Increase Vancomycin to 1250mg  IV q12hrs Check trough level weekly while on Vancomycin Check SCr at least twice weekly while on Vancomycin Monitor labs, renal fxn, and cultures Duration of therapy per MD  [name] A 05/08/2013,2:33 PM

## 2013-05-08 NOTE — Progress Notes (Signed)
Subjective: Patient feels better today. She is out of ICU. Her heart rate is controlled. No nausea or vomiting. Objective: Vital signs in last 24 hours: Temp:  [99.4 F (37.4 C)-100 F (37.8 C)] 99.5 F (37.5 C) (05/25 0628) Pulse Rate:  [90-95] 90 (05/25 0628) Resp:  [20-31] 20 (05/25 0628) BP: (103-122)/(62-86) 112/70 mmHg (05/25 0628) SpO2:  [95 %-100 %] 100 % (05/25 9562) Weight change:  Last BM Date: 05/06/13  Intake/Output from previous day: 05/24 0701 - 05/25 0700 In: 1650 [I.V.:1100; IV Piggyback:550] Out: 3300 [Urine:3300]  PHYSICAL EXAM General appearance: alert and no distress Resp: diminished breath sounds bilaterally and rhonchi bilaterally Cardio: S1, S2 normal GI: soft, non-tender; bowel sounds normal; no masses,  no organomegaly Extremities: extremities normal, atraumatic, no cyanosis or edema  Lab Results:    @labtest @ [facility] No results found for this basename: PHART, PCO2, PO2ART, TCO2, HCO3,  in the last 72 hours CULTURES Recent Results (from the past 240 hour(s))  MRSA PCR SCREENING     Status: None   Collection Time    05/06/13 12:04 PM      Result Value Range Status   MRSA by PCR NEGATIVE  NEGATIVE Final   Comment:            The GeneXpert MRSA Assay (FDA     approved for NASAL specimens     only), is one component of a     comprehensive MRSA colonization     surveillance program. It is not     intended to diagnose MRSA     infection nor to guide or     monitor treatment for     MRSA infections.   Studies/Results: Dg Chest Port 1 View  05/06/2013   *RADIOLOGY REPORT*  Clinical Data: Shortness of breath, acute bronchitis  PORTABLE CHEST - 1 VIEW  Comparison: 03/09/2013  Findings: Diffuse reticulonodular opacities bilaterally, possibly reflecting mild to moderate interstitial edema or atypical infection. No pleural effusion or pneumothorax.  Mild cardiomegaly.  IMPRESSION: Diffuse reticulonodular opacities bilaterally, possibly reflecting mild  to moderate interstitial edema or atypical infection.   Original Report Authenticated By: Charline Bills, M.D.    Medications: I have reviewed the patient's current medications.  Assesment: Principal Problem:   Shortness of breath Active Problems:   Atrial fibrillation with RVR   Bilateral pneumonia   Acute renal insufficiency   Normocytic anemia    Plan: Continue Iv antibiotics Continue telemetry Continue regular treatment    LOS: 2 days   Dollie Bressi 05/08/2013, 8:02 AM

## 2013-05-09 ENCOUNTER — Inpatient Hospital Stay (HOSPITAL_COMMUNITY): Payer: PRIVATE HEALTH INSURANCE

## 2013-05-09 LAB — BASIC METABOLIC PANEL
BUN: 8 mg/dL (ref 6–23)
CO2: 29 mEq/L (ref 19–32)
Calcium: 9.2 mg/dL (ref 8.4–10.5)
Chloride: 103 mEq/L (ref 96–112)
Creatinine, Ser: 0.8 mg/dL (ref 0.50–1.10)
GFR calc Af Amer: >90 mL/min (ref >90)
GFR calc non Af Amer: 84 mL/min — ABNORMAL LOW (ref >90)
Glucose, Bld: 97 mg/dL (ref 70–99)
Potassium: 4.1 mEq/L (ref 3.5–5.1)
Sodium: 139 mEq/L (ref 135–145)

## 2013-05-09 MED ORDER — SODIUM CHLORIDE 0.9 % IJ SOLN
10.0000 mL | INTRAMUSCULAR | Status: DC | PRN
  Administered 2013-05-09: 10 mL via INTRAVENOUS

## 2013-05-09 NOTE — Progress Notes (Signed)
UR Chart Review Completed  

## 2013-05-09 NOTE — Progress Notes (Signed)
Subjective: Patient is progressively improving. Her Breathing is better. Her cough is less. Objective: Vital signs in last 24 hours: Temp:  [97.9 F (36.6 C)-101.3 F (38.5 C)] 97.9 F (36.6 C) (05/26 0551) Pulse Rate:  [84-97] 84 (05/26 0551) Resp:  [18-20] 18 (05/26 0551) BP: (116-124)/(72-76) 116/74 mmHg (05/26 0551) SpO2:  [95 %-100 %] 95 % (05/26 0551) Weight:  [102.2 kg (225 lb 5 oz)-102.9 kg (226 lb 13.7 oz)] 102.9 kg (226 lb 13.7 oz) (05/26 0500) Weight change:  Last BM Date: 05/08/13  Intake/Output from previous day: 05/25 0701 - 05/26 0700 In: 1534.2 [I.V.:934.2; IV Piggyback:600] Out: 500 [Urine:500]  PHYSICAL EXAM General appearance: alert and no distress Resp: diminished breath sounds bilaterally and rhonchi bilaterally Cardio: S1, S2 normal GI: soft, non-tender; bowel sounds normal; no masses,  no organomegaly Extremities: extremities normal, atraumatic, no cyanosis or edema  Lab Results:    @labtest @ [facility] No results found for this basename: PHART, PCO2, PO2ART, TCO2, HCO3,  in the last 72 hours CULTURES Recent Results (from the past 240 hour(s))  MRSA PCR SCREENING     Status: None   Collection Time    05/06/13 12:04 PM      Result Value Range Status   MRSA by PCR NEGATIVE  NEGATIVE Final   Comment:            The GeneXpert MRSA Assay (FDA     approved for NASAL specimens     only), is one component of a     comprehensive MRSA colonization     surveillance program. It is not     intended to diagnose MRSA     infection nor to guide or     monitor treatment for     MRSA infections.   Studies/Results: No results found.  Medications: I have reviewed the patient's current medications.  Assesment: Principal Problem:   Shortness of breath Active Problems:   Atrial fibrillation with RVR   Bilateral pneumonia   Acute renal insufficiency   Normocytic anemia    Plan: Continue Iv antibiotics Will repeat chest X-ray Continue regular  treatment    LOS: 3 days   Megan Mcknight 05/09/2013, 8:58 AM

## 2013-05-10 ENCOUNTER — Inpatient Hospital Stay (HOSPITAL_COMMUNITY): Payer: PRIVATE HEALTH INSURANCE

## 2013-05-10 DIAGNOSIS — R0602 Shortness of breath: Secondary | ICD-10-CM

## 2013-05-10 LAB — BASIC METABOLIC PANEL
BUN: 8 mg/dL (ref 6–23)
CO2: 27 mEq/L (ref 19–32)
Calcium: 9.1 mg/dL (ref 8.4–10.5)
Chloride: 106 mEq/L (ref 96–112)
Creatinine, Ser: 0.71 mg/dL (ref 0.50–1.10)
GFR calc Af Amer: >90 mL/min (ref >90)
GFR calc non Af Amer: >90 mL/min (ref >90)
Glucose, Bld: 109 mg/dL — ABNORMAL HIGH (ref 70–99)
Potassium: 4 mEq/L (ref 3.5–5.1)
Sodium: 141 mEq/L (ref 135–145)

## 2013-05-10 LAB — CBC
HCT: 29.9 % — ABNORMAL LOW (ref 36.0–46.0)
Hemoglobin: 9.4 g/dL — ABNORMAL LOW (ref 12.0–15.0)
MCH: 25.8 pg — ABNORMAL LOW (ref 26.0–34.0)
MCHC: 31.4 g/dL (ref 30.0–36.0)
MCV: 81.9 fL (ref 78.0–100.0)
Platelets: 308 10*3/uL (ref 150–400)
RBC: 3.65 MIL/uL — ABNORMAL LOW (ref 3.87–5.11)
RDW: 13.6 % (ref 11.5–15.5)
WBC: 13.7 10*3/uL — ABNORMAL HIGH (ref 4.0–10.5)

## 2013-05-10 LAB — VANCOMYCIN, TROUGH: Vancomycin Tr: 6.5 ug/mL — ABNORMAL LOW (ref 10.0–20.0)

## 2013-05-10 MED ORDER — IOHEXOL 300 MG/ML  SOLN: 80.0000 mL | Freq: Once | INTRAMUSCULAR | Status: AC | PRN

## 2013-05-10 MED ORDER — METOPROLOL TARTRATE 25 MG PO TABS
25.0000 mg | ORAL_TABLET | Freq: Three times a day (TID) | ORAL | Status: DC
  Administered 2013-05-10 – 2013-05-24 (×41): 25 mg via ORAL
  Filled 2013-05-10 (×44): qty 1

## 2013-05-10 MED ORDER — VANCOMYCIN HCL 10 G IV SOLR
1500.0000 mg | Freq: Three times a day (TID) | INTRAVENOUS | Status: DC
  Administered 2013-05-10 – 2013-05-14 (×12): 1500 mg via INTRAVENOUS
  Filled 2013-05-10 (×14): qty 1500

## 2013-05-10 NOTE — Consult Note (Addendum)
Patient Name: Megan Mcknight  MRN: [id number]  HPI: Megan Mcknight is an 52 y.o. female referred for consultation by Phebe Colla, MD for atrial fibrillation.  This nice woman has enjoyed generally excellent health. She has not previously been evaluated by a cardiologist nor undergone any significant cardiac testing. She presented with palpitations and was found to have atrial fibrillation with a rapid ventricular response, which soon converted to sinus rhythm.  There has been no recurrence of atrial fibrillation since.  She had a preceding one-week history of nonproductive cough and dyspnea, but denies orthopnea and PND. CT of chest shows diffuse pulmonary nodules, mediastinal lymph nodes and multiple low attenuation focal lesions in the liver, all worrisome for metastatic neoplastic disease.  Past Medical History  Diagnosis Date  . Hyperlipidemia   . Nerve pain     Left leg  . Seasonal allergies   . Acute bronchitis   . Colon polyps 02/2013    Per colonoscopy  . Diverticulosis 02/2013    Per colonoscopy  . Former light tobacco smoker    Past Surgical History  Procedure Laterality Date  . Partial hysterectomy    . Bones removed from baby toes    . Colonoscopy  02/16/2012    Procedure: COLONOSCOPY;  Surgeon: Corbin Ade, MD;  Location: [facility];  Service: Endoscopy;  Laterality: N/A;  1:30  . Abdominal hysterectomy     Family History  Problem Relation Age of Onset  . Heart defect      family history   . Asthma      family history   . Colon polyps Mother    Social History:  reports that she has been smoking Cigarettes.  She has been smoking about 0.50 packs per day. She does not have any smokeless tobacco history on file. She reports that  drinks alcohol. She reports that she does not use illicit drugs.  Allergies:  Allergies  Allergen Reactions  . Iodine Anaphylaxis    Told to avoid due to shellfish allergy.  Marland Kitchen Shellfish-Derived Products Anaphylaxis and Swelling    Eyes  swelling, scratchy throat, bumps on lips.   Medications:  I have reviewed the patient's current medications. Scheduled: . atorvastatin  20 mg Oral q1800  . benzonatate  100 mg Oral TID  . diltiazem  60 mg Oral Q8H  . enoxaparin (LOVENOX) injection  40 mg Subcutaneous Q24H  . hydrocortisone   Rectal QHS  . levofloxacin (LEVAQUIN) IV  750 mg Intravenous Q24H  . senna  1 tablet Oral QHS  . vancomycin  1,500 mg Intravenous Q8H   CBC     Status: Abnormal    05/10/13  5:56 AM      Result Value Range   WBC 13.7 (*) 4.0 - 10.5 K/uL   RBC 3.65 (*) 3.87 - 5.11 MIL/uL   Hemoglobin 9.4 (*) 12.0 - 15.0 g/dL   HCT 40.9 (*) 81.1 - 91.4 %   MCV 81.9  78.0 - 100.0 fL   MCH 25.8 (*) 26.0 - 34.0 pg   MCHC 31.4  30.0 - 36.0 g/dL   RDW 78.2  95.6 - 21.3 %   Platelets 308  150 - 400 K/uL  BASIC METABOLIC PANEL     Status: Abnormal   Collection Time    05/10/13  5:56 AM      Result Value Range   Sodium 141  135 - 145 mEq/L   Potassium 4.0  3.5 - 5.1 mEq/L   Chloride  106  96 - 112 mEq/L   CO2 27  19 - 32 mEq/L   Glucose, Bld 109 (*) 70 - 99 mg/dL   BUN 8  6 - 23 mg/dL   Creatinine, Ser 8.11  0.50 - 1.10 mg/dL   Calcium 9.1  8.4 - 91.4 mg/dL  TSH: 2.9  Chest  7/82/9562   Primary concern is that of atypical infection with worsening volume loss at the lung bases.  The pattern would be unusual for edema.   Review of Systems: General: no anorexia, weight gain or weight loss Cardiac: Mild pleuritic chest pain, + dyspnea, no orthopnea, PND,  or syncope Respiratory:  Cough, no sputum production or hemoptysis GI: no nausea, abdominal pain, emesis, diarrhea or constipation Integument: no significant lesions Neurologic: No muscle weakness or paralysis; no speech disturbance; no headache All other systems reviewed and are negative.  Physical Exam: Blood pressure 136/81, pulse 96, temperature 98.4 F (36.9 C), temperature source Oral, resp. rate 18, height 5\' 8"  (1.727 m), weight 110.6 kg (243 lb  13.3 oz), SpO2 94.00%.;  Body mass index is 37.08 kg/(m^2). General-Well-developed; no acute distress; moderately overweight HEENT-[place]/AT; PERRL; EOM intact; conjunctiva and lids nl Neck-No JVD; no carotid bruits Endocrine-full neck, but no thyromegaly Lungs-Clear lung fields; resonant percussion; normal I-to-E ratio Cardiovascular- normal PMI; normal S1 and S2; modest systolic ejection murmur at base  Abdomen-BS normal; soft and non-tender without masses or organomegaly Musculoskeletal-No deformities, cyanosis or clubbing Neurologic-Nl cranial nerves; symmetric strength and tone Skin- Warm, no significant lesions Extremities-Nl distal pulses; trace edema     Assessment/Plan: Atrial fibrillation: Possibly related to stress of acute illness, but simultaneous presentation may simply represent symptomatic atrial fib bringing the patient's lung issues to light.  She has spontaneously converted to normal sinus rhythm and maintained that for the past 48 hours without any antiarrhythmic medication. Treatment with beta blocker may be slightly more desirable than a calcium channel antagonists, and that substitution will be made. Echocardiography is pending. I doubt that cardiac issues will be of substantial importance during this admission.  Patient has low risk for thromboembolism and does not require full anticoagulation.  [place] Bing, MD 05/10/2013, 5:32 PM

## 2013-05-10 NOTE — Progress Notes (Signed)
ANTIBIOTIC CONSULT NOTE   Pharmacy Consult for Vancomycin Indication: PNA  Allergies  Allergen Reactions  . Iodine Anaphylaxis    Told to avoid due to shellfish allergy.  Marland Kitchen Shellfish-Derived Products Anaphylaxis and Swelling    Eyes swelling, scratchy throat, bumps on lips.   Patient Measurements: Height: 5\' 8"  (172.7 cm) Weight: 243 lb 13.3 oz (110.6 kg) IBW/kg (Calculated) : 63.9  Vital Signs: Temp: 99 F (37.2 C) (05/27 0354) Temp src: Oral (05/27 0354) BP: 123/71 mmHg (05/27 0354) Pulse Rate: 87 (05/27 0354) Intake/Output from previous day: 05/26 0701 - 05/27 0700 In: 320 [P.O.:320] Out: -  Intake/Output from this shift:    Labs:  Recent Labs  05/08/13 0810 05/09/13 0615 05/10/13 0556  WBC 14.0*  --  13.7*  HGB 9.5*  --  9.4*  PLT 288  --  308  CREATININE 0.81 0.80 0.71   Estimated Creatinine Clearance: 108.5 ml/min (by C-G formula based on Cr of 0.71).  Recent Labs  05/08/13 1200 05/10/13 0852  VANCOTROUGH 5.0* 6.5*    Microbiology: Recent Results (from the past 720 hour(s))  MRSA PCR SCREENING     Status: None   Collection Time    05/06/13 12:04 PM      Result Value Range Status   MRSA by PCR NEGATIVE  NEGATIVE Final   Comment:            The GeneXpert MRSA Assay (FDA     approved for NASAL specimens     only), is one component of a     comprehensive MRSA colonization     surveillance program. It is not     intended to diagnose MRSA     infection nor to guide or     monitor treatment for     MRSA infections.   Medical History: Past Medical History  Diagnosis Date  . Hyperlipidemia   . Nerve pain     Left leg  . Seasonal allergies   . Acute bronchitis   . Colon polyps 02/2013    Per colonoscopy  . Diverticulosis 02/2013    Per colonoscopy  . Former light tobacco smoker    Medications:  Scheduled:  . atorvastatin  20 mg Oral q1800  . benzonatate  100 mg Oral TID  . diltiazem  60 mg Oral Q8H  . enoxaparin (LOVENOX) injection  40  mg Subcutaneous Q24H  . hydrocortisone   Rectal QHS  . levofloxacin (LEVAQUIN) IV  750 mg Intravenous Q24H  . senna  1 tablet Oral QHS  . vancomycin  1,250 mg Intravenous Q12H   Assessment: 52yo obese F on day#5 Vancomycin & Levaquin for PNA.  Patient is clinically improved but infiltrates worsening on CXR. SCr has been stable.  Trough level is below goal.      Goal of Therapy:  Vancomycin trough level 15-20 mcg/ml  Plan:  Increase Vancomycin to 1500mg  IV Q8h Check trough level weekly while on Vancomycin Monitor labs, renal fxn, and cultures Duration of therapy per MD  Elson Clan 05/10/2013,11:39 AM

## 2013-05-10 NOTE — Progress Notes (Signed)
Subjective: Patient feels better. However, her repeat chest x-ray shows worsening nodular opacities. No fever or chills. Objective: Vital signs in last 24 hours: Temp:  [98.1 F (36.7 C)-99 F (37.2 C)] 99 F (37.2 C) (05/27 0354) Pulse Rate:  [78-87] 87 (05/27 0354) Resp:  [16-17] 17 (05/27 0354) BP: (110-123)/(71-84) 123/71 mmHg (05/27 0354) SpO2:  [96 %-98 %] 98 % (05/27 0354) Weight:  [102.9 kg (226 lb 13.7 oz)-110.6 kg (243 lb 13.3 oz)] 110.6 kg (243 lb 13.3 oz) (05/27 0500) Weight change: 0.7 kg (1 lb 8.7 oz) Last BM Date: 05/08/13  Intake/Output from previous day: 05/26 0701 - 05/27 0700 In: 320 [P.O.:320] Out: -   PHYSICAL EXAM General appearance: alert and no distress Resp: diminished breath sounds bilaterally and rhonchi bilaterally Cardio: S1, S2 normal GI: soft, non-tender; bowel sounds normal; no masses,  no organomegaly Extremities: extremities normal, atraumatic, no cyanosis or edema  Lab Results:    @labtest @ [facility] No results found for this basename: PHART, PCO2, PO2ART, TCO2, HCO3,  in the last 72 hours CULTURES Recent Results (from the past 240 hour(s))  MRSA PCR SCREENING     Status: None   Collection Time    05/06/13 12:04 PM      Result Value Range Status   MRSA by PCR NEGATIVE  NEGATIVE Final   Comment:            The GeneXpert MRSA Assay (FDA     approved for NASAL specimens     only), is one component of a     comprehensive MRSA colonization     surveillance program. It is not     intended to diagnose MRSA     infection nor to guide or     monitor treatment for     MRSA infections.   Studies/Results: Dg Chest Port 1 View  05/09/2013   *RADIOLOGY REPORT*  Clinical Data: Follow-up edema/pneumonia pattern.  PORTABLE CHEST - 1 VIEW  Comparison: 05/06/2013 and 03/09/2013  Findings: Artifact overlies chest.  Heart size remains at the upper limits of normal.  Mediastinal shadows are normal.  Widespread bilateral pulmonary densities with a nodular  pattern remain evident.  More pronounced density in both lower lobes is present with some volume loss.  The lungs are clear and late March.  The pattern is most worrisome for atypical infection.  This would be an unusual manifestation of edema.  Worsening density in the lower lobes could be due to worsening alveolar consolidation or volume loss.  IMPRESSION: Diffuse micronodular pattern.  Worsening density at the lung bases. Primary concern is that of atypical infection with worsening volume loss at the lung bases.  The pattern would be unusual for edema.   Original Report Authenticated By: Paulina Fusi, M.D.    Medications: I have reviewed the patient's current medications.  Assesment: Principal Problem:   Shortness of breath Active Problems:   Atrial fibrillation with RVR   Bilateral pneumonia   Acute renal insufficiency   Normocytic anemia    Plan: Continue Iv antibiotics Will do CT Scan of the chest Pulmonary consult.    LOS: 4 days   Megan Mcknight 05/10/2013, 8:07 AM

## 2013-05-10 NOTE — Care Management Note (Addendum)
    Page 1 of 1   05/22/2013     4:23:49 PM   CARE MANAGEMENT NOTE 05/22/2013  Patient:  Megan Mcknight, Megan Mcknight   Account Number:  [id number]  Date Initiated:  05/10/2013  Documentation initiated by:  Sharrie Rothman  Subjective/Objective Assessment:   Pt admitted from home with a fib and pneumonia. Pt lives with her husband and will return home at discharge. Pt is indpendent with ADL's.     Action/Plan:   Pt may need O2 at discharge. Will continue to monitor for [facility] needs.   Anticipated DC Date:  05/24/2013   Anticipated DC Plan:  HOME/SELF CARE      DC Planning Services  CM consult      Choice offered to / List presented to:             Status of service:  In process, will continue to follow Medicare Important Message given?   (If response is "NO", the following Medicare IM given date fields will be blank) Date Medicare IM given:   Date Additional Medicare IM given:    Discharge Disposition:    Per UR Regulation:    If discussed at Long Length of Stay Meetings, dates discussed:    Comments:  05/22/13 Tavoris Brisk RN,BSN NCM WEEKEND [phone] TRANSFERRED FROM [facility] 05/21/11-FOR CHEMO.FOR PORTA-CATH TOMORROW.IF HOME 02 NEEDED WILL NEED 02 SATS DOCUMENTED IN PROGRESS NOTES:ON RA @REST ,AMBULATION WITHOUT 02,THEN AMBULATION WITH 02.  05/19/13 JULIE AMERSON,RN,BSN [phone] AWAIT ONCOLOGIST RECOMMENDATIONS PRIOR TO ARRANGING DC PLANS.  PT WILL LIKELY NEED HOME O2.  WILL FOLLOW.  05/11/13 0940 Arlyss Queen, RN BSN CM Pt new diagnosis of cancer with mets to liver and lungs. Pt transfering to [facility] for furthur workup.  05/10/13 1127 Arlyss Queen, RN BSN CM

## 2013-05-10 NOTE — Consult Note (Signed)
Consult requested by: Dr. Felecia Shelling Consult requested for worsening chest x-ray:  HPI: This is a 52 year old with no known lung problems who was admitted to the hospital with pneumonia and rapid atrial fibrillation. She says that she had asthma in childhood but no problems since then. She was found to have pneumonia and was started on treatment. Her atrial fibrillation resolved with treatment of her pneumonia. She has clinically improved but had chest x-ray that shows worsening infiltrates. She says she feels better but not well. She is still somewhat short of breath. She's coughing a little bit.  Past Medical History  Diagnosis Date  . Hyperlipidemia   . Nerve pain     Left leg  . Seasonal allergies   . Acute bronchitis   . Colon polyps 02/2013    Per colonoscopy  . Diverticulosis 02/2013    Per colonoscopy  . Former light tobacco smoker      Family History  Problem Relation Age of Onset  . Heart defect      family history   . Asthma      family history   . Colon polyps Mother      History   Social History  . Marital Status: Divorced    Spouse Name: N/A    Number of Children: 3  . Years of Education: N/A   Occupational History  . technician    .     Social History Main Topics  . Smoking status: Current Every Day Smoker -- 0.50 packs/day    Types: Cigarettes  . Smokeless tobacco: None  . Alcohol Use: Yes     Comment: one glass every three weeks  . Drug Use: No  . Sexually Active: None   Other Topics Concern  . None   Social History Narrative  . None     ROS: She denies any fever chills chest pain nausea vomiting or diarrhea    Objective: Vital signs in last 24 hours: Temp:  [98.1 F (36.7 C)-99 F (37.2 C)] 99 F (37.2 C) (05/27 0354) Pulse Rate:  [78-87] 87 (05/27 0354) Resp:  [16-17] 17 (05/27 0354) BP: (110-123)/(71-84) 123/71 mmHg (05/27 0354) SpO2:  [96 %-98 %] 98 % (05/27 0354) Weight:  [102.9 kg (226 lb 13.7 oz)-110.6 kg (243 lb 13.3 oz)] 110.6  kg (243 lb 13.3 oz) (05/27 0500) Weight change: 0.7 kg (1 lb 8.7 oz) Last BM Date: 05/08/13  Intake/Output from previous day: 05/26 0701 - 05/27 0700 In: 320 [P.O.:320] Out: -   PHYSICAL EXAM She is awake and alert. She does not appear to be in acute distress. Her pupils are reactive. Her nose and throat clear. I don't see any lymphadenopathy in her neck or supraclavicular areas. Her chest shows some crackles in the bases bilaterally. Her heart is regular without gallop. Her abdomen is soft. Extremities showed no edema. Central nervous system exam is grossly intact  Lab Results: Basic Metabolic Panel:  Recent Labs  16/10/96 0615 05/10/13 0556  NA 139 141  K 4.1 4.0  CL 103 106  CO2 29 27  GLUCOSE 97 109*  BUN 8 8  CREATININE 0.80 0.71  CALCIUM 9.2 9.1   Liver Function Tests: No results found for this basename: AST, ALT, ALKPHOS, BILITOT, PROT, ALBUMIN,  in the last 72 hours No results found for this basename: LIPASE, AMYLASE,  in the last 72 hours No results found for this basename: AMMONIA,  in the last 72 hours CBC:  Recent Labs  05/08/13 0810  05/10/13 0556  WBC 14.0* 13.7*  HGB 9.5* 9.4*  HCT 29.7* 29.9*  MCV 80.9 81.9  PLT 288 308   Cardiac Enzymes: No results found for this basename: CKTOTAL, CKMB, CKMBINDEX, TROPONINI,  in the last 72 hours BNP: No results found for this basename: PROBNP,  in the last 72 hours D-Dimer: No results found for this basename: DDIMER,  in the last 72 hours CBG: No results found for this basename: GLUCAP,  in the last 72 hours Hemoglobin A1C: No results found for this basename: HGBA1C,  in the last 72 hours Fasting Lipid Panel: No results found for this basename: CHOL, HDL, LDLCALC, TRIG, CHOLHDL, LDLDIRECT,  in the last 72 hours Thyroid Function Tests: No results found for this basename: TSH, T4TOTAL, FREET4, T3FREE, THYROIDAB,  in the last 72 hours Anemia Panel: No results found for this basename: VITAMINB12, FOLATE,  FERRITIN, TIBC, IRON, RETICCTPCT,  in the last 72 hours Coagulation: No results found for this basename: LABPROT, INR,  in the last 72 hours Urine Drug Screen: Drugs of Abuse  No results found for this basename: labopia, cocainscrnur, labbenz, amphetmu, thcu, labbarb    Alcohol Level: No results found for this basename: ETH,  in the last 72 hours Urinalysis: No results found for this basename: COLORURINE, APPERANCEUR, LABSPEC, PHURINE, GLUCOSEU, HGBUR, BILIRUBINUR, KETONESUR, PROTEINUR, UROBILINOGEN, NITRITE, LEUKOCYTESUR,  in the last 72 hours Misc. Labs:   ABGS: No results found for this basename: PHART, PCO2, PO2ART, TCO2, HCO3,  in the last 72 hours   MICROBIOLOGY: Recent Results (from the past 240 hour(s))  MRSA PCR SCREENING     Status: None   Collection Time    05/06/13 12:04 PM      Result Value Range Status   MRSA by PCR NEGATIVE  NEGATIVE Final   Comment:            The GeneXpert MRSA Assay (FDA     approved for NASAL specimens     only), is one component of a     comprehensive MRSA colonization     surveillance program. It is not     intended to diagnose MRSA     infection nor to guide or     monitor treatment for     MRSA infections.    Studies/Results: Dg Chest Port 1 View  05/09/2013   *RADIOLOGY REPORT*  Clinical Data: Follow-up edema/pneumonia pattern.  PORTABLE CHEST - 1 VIEW  Comparison: 05/06/2013 and 03/09/2013  Findings: Artifact overlies chest.  Heart size remains at the upper limits of normal.  Mediastinal shadows are normal.  Widespread bilateral pulmonary densities with a nodular pattern remain evident.  More pronounced density in both lower lobes is present with some volume loss.  The lungs are clear and late March.  The pattern is most worrisome for atypical infection.  This would be an unusual manifestation of edema.  Worsening density in the lower lobes could be due to worsening alveolar consolidation or volume loss.  IMPRESSION: Diffuse  micronodular pattern.  Worsening density at the lung bases. Primary concern is that of atypical infection with worsening volume loss at the lung bases.  The pattern would be unusual for edema.   Original Report Authenticated By: Paulina Fusi, M.D.    Medications:  Prior to Admission:  Prescriptions prior to admission  Medication Sig Dispense Refill  . albuterol (PROVENTIL HFA;VENTOLIN HFA) 108 (90 BASE) MCG/ACT inhaler Inhale 2 puffs into the lungs every 6 (six) hours as needed for wheezing.      Marland Kitchen  ibuprofen (ADVIL,MOTRIN) 200 MG tablet Take 400 mg by mouth every 6 (six) hours as needed for pain.      . montelukast (SINGULAIR) 10 MG tablet Take 10 mg by mouth daily as needed (Allergies).       . rosuvastatin (CRESTOR) 10 MG tablet Take 10 mg by mouth daily.       Scheduled: . atorvastatin  20 mg Oral q1800  . benzonatate  100 mg Oral TID  . diltiazem  60 mg Oral Q8H  . enoxaparin (LOVENOX) injection  40 mg Subcutaneous Q24H  . hydrocortisone   Rectal QHS  . levofloxacin (LEVAQUIN) IV  750 mg Intravenous Q24H  . senna  1 tablet Oral QHS  . vancomycin  1,250 mg Intravenous Q12H   Continuous: . 0.9 % NaCl with KCl 20 mEq / L 50 mL/hr at 05/10/13 0401   NWG:[id number], acetaminophen, guaiFENesin-dextromethorphan, ibuprofen, levalbuterol, montelukast, morphine injection, ondansetron (ZOFRAN) IV, ondansetron, oxyCODONE, sodium chloride  Assesment: She has community-acquired bilateral pneumonia. She has clinically improved but chest x-ray looks worse. Principal Problem:   Shortness of breath Active Problems:   Atrial fibrillation with RVR   Bilateral pneumonia   Acute renal insufficiency   Normocytic anemia    Plan: I agree with plans for CT of the chest to try get a better idea exactly what this is    LOS: 4 days   Conita Amenta L 05/10/2013, 8:56 AM

## 2013-05-11 DIAGNOSIS — D381 Neoplasm of uncertain behavior of trachea, bronchus and lung: Secondary | ICD-10-CM

## 2013-05-11 DIAGNOSIS — R599 Enlarged lymph nodes, unspecified: Secondary | ICD-10-CM

## 2013-05-11 LAB — BASIC METABOLIC PANEL
BUN: 6 mg/dL (ref 6–23)
CO2: 26 mEq/L (ref 19–32)
Calcium: 8.9 mg/dL (ref 8.4–10.5)
Chloride: 106 mEq/L (ref 96–112)
Creatinine, Ser: 0.68 mg/dL (ref 0.50–1.10)
GFR calc Af Amer: >90 mL/min (ref >90)
GFR calc non Af Amer: >90 mL/min (ref >90)
Glucose, Bld: 95 mg/dL (ref 70–99)
Potassium: 3.7 mEq/L (ref 3.5–5.1)
Sodium: 141 mEq/L (ref 135–145)

## 2013-05-11 MED ORDER — ENOXAPARIN SODIUM 30 MG/0.3ML ~~LOC~~ SOLN
40.0000 mg | SUBCUTANEOUS | Status: DC
  Filled 2013-05-11: qty 0.4

## 2013-05-11 MED ORDER — ENOXAPARIN SODIUM 40 MG/0.4ML ~~LOC~~ SOLN
40.0000 mg | SUBCUTANEOUS | Status: DC
  Administered 2013-05-12 – 2013-05-17 (×5): 40 mg via SUBCUTANEOUS
  Filled 2013-05-11 (×7): qty 0.4

## 2013-05-11 NOTE — H&P (Signed)
Patient seen and examined. Agree with above.  52 yo woman admitted with shortness of breath and a fib with a rapid response. She has converted to SR with medical therapy. Work up has revealed widespread bilateral pulmonary nodules and adenopathy. She also has liver lesions. The picture is highly suspicious for metastatic cancer. We need to establish a diagnosis so appropriate treatment can be instituted. In my opinion the best option is proceed with bronchoscopy, EBUS and possible mediastinoscopy. I have discussed with her the differential diagnosis and explained the reasons for the proposed procedure. We discussed the indications, risks, benefits and alternatives. She understands the procedure is diagnostic and not therapeutic and that the disease, whatever it ends up being, will require systemic treatment. She understands the risks include, but are not limited to those associated with GA, bleeding, possible thoracotomy, infection, pneumothorax, recurrent nerve injury with hoarseness, esophageal injury and stroke. She accepts these risks and agrees to proceed. For OR Friday AM- 1st available

## 2013-05-11 NOTE — Progress Notes (Signed)
Called [facility] and spoke with Dois Davenport, RN to give report. Nurse was in process of sending another pt to OR. Will call me back for report.

## 2013-05-11 NOTE — Progress Notes (Signed)
Subjective: Patient is resting. She feels better, however, her Ct Scan of the chest shows multiple metastatic lesions and enlarged mediastinal lymph node. Objective: Vital signs in last 24 hours: Temp:  [98.4 F (36.9 C)-99.3 F (37.4 C)] 99.2 F (37.3 C) (05/28 0551) Pulse Rate:  [84-96] 87 (05/28 0551) Resp:  [18] 18 (05/28 0551) BP: (121-136)/(78-81) 128/79 mmHg (05/28 0551) SpO2:  [94 %-98 %] 97 % (05/28 0551) Weight:  [113.6 kg (250 lb 7.1 oz)] 113.6 kg (250 lb 7.1 oz) (05/28 0500) Weight change: 10.7 kg (23 lb 9.4 oz) Last BM Date: 05/10/13  Intake/Output from previous day: 05/27 0701 - 05/28 0700 In: 2380 [P.O.:680; I.V.:700; IV Piggyback:1000] Out: 1 [Stool:1]  PHYSICAL EXAM General appearance: alert and no distress Resp: diminished breath sounds bilaterally and rhonchi bilaterally Cardio: S1, S2 normal GI: soft, non-tender; bowel sounds normal; no masses,  no organomegaly Extremities: extremities normal, atraumatic, no cyanosis or edema  Lab Results:    @labtest @ ABGS No results found for this basename: PHART, PCO2, PO2ART, TCO2, HCO3,  in the last 72 hours CULTURES Recent Results (from the past 240 hour(s))  MRSA PCR SCREENING     Status: None   Collection Time    05/06/13 12:04 PM      Result Value Range Status   MRSA by PCR NEGATIVE  NEGATIVE Final   Comment:            The GeneXpert MRSA Assay (FDA     approved for NASAL specimens     only), is one component of a     comprehensive MRSA colonization     surveillance program. It is not     intended to diagnose MRSA     infection nor to guide or     monitor treatment for     MRSA infections.   Studies/Results: Ct Chest Wo Contrast  05/10/2013   *RADIOLOGY REPORT*  Clinical Data: Abnormal chest radiograph  CT CHEST WITHOUT CONTRAST  Technique:  Multidetector CT imaging of the chest was performed following the standard protocol without IV contrast.  Comparison: None  Findings: Lungs/pleura: Small pleural  effusions are identified.   Innumerable pulmonary nodules are identified throughout both lungs.  Some of these nodules are coalescent and others are clustered.  Index nodule within the right upper lobe measures 1 cm, image number 19/series 3.  Index nodule within the left upper lobe measures 1 cm, image 26/series 3.  Right middle lobe nodule measures 1.2 cm. Airspace consolidation and atelectasis is noted within the posterior lung bases posteriorly.  Heart/Mediastinum: Multiple enlarged mediastinal lymph nodes are identified bilaterally.  Index prevascular lymph node measures 1.5 cm, image 20/series 2.  Index subcarinal lymph node measures 2.1 cm, image 25/series 2. At the thoracic inlet there is a right sided lymph node measuring 1 cm.  The heart is mildly enlarged.  There is no pericardial effusion.  Upper abdomen: Incidental imaging through the upper abdomen shows multi focal areas of low attenuation within the liver parenchyma. Index lesion along the anterior right hepatic lobe measures 3.2 cm, image 45/series 2.  Left hepatic lobe lesion measures 3.6 cm, image 49/series 2.  The adrenal glands are both of the adrenal glands are grossly unremarkable.  Bones/Musculoskeletal:  No aggressive lytic or sclerotic bone lesions identified.  IMPRESSION:  1.  Diffuse pulmonary nodules are worrisome for metastatic disease. 2. Enlarged bilateral mediastinal lymph nodes are concerning for metastatic adenopathy. 3.  Multi focal low attenuation lesions within the liver are  concerning for metastatic disease.   Original Report Authenticated By: Signa Kell, M.D.   Dg Chest Port 1 View  05/09/2013   *RADIOLOGY REPORT*  Clinical Data: Follow-up edema/pneumonia pattern.  PORTABLE CHEST - 1 VIEW  Comparison: 05/06/2013 and 03/09/2013  Findings: Artifact overlies chest.  Heart size remains at the upper limits of normal.  Mediastinal shadows are normal.  Widespread bilateral pulmonary densities with a nodular pattern remain  evident.  More pronounced density in both lower lobes is present with some volume loss.  The lungs are clear and late March.  The pattern is most worrisome for atypical infection.  This would be an unusual manifestation of edema.  Worsening density in the lower lobes could be due to worsening alveolar consolidation or volume loss.  IMPRESSION: Diffuse micronodular pattern.  Worsening density at the lung bases. Primary concern is that of atypical infection with worsening volume loss at the lung bases.  The pattern would be unusual for edema.   Original Report Authenticated By: Paulina Fusi, M.D.    Medications: I have reviewed the patient's current medications.  Assesment: Principal Problem:   Shortness of breath Active Problems:   Atrial fibrillation with RVR   Bilateral pneumonia   Acute renal insufficiency   Normocytic anemia multiple pulmonary nodules and enlarged mediastinal lymph nodes   Plan: Continue Iv antibiotics Will do CT Scan of the chest Talked with Dr. Juanetta Gosling who is gong to talk to thoracic surgeon to make arrangement for biopsy..    LOS: 5 days   Megan Mcknight 05/11/2013, 7:50 AM

## 2013-05-11 NOTE — Progress Notes (Signed)
Pt transferred to [facility] via [facility]. Report called to Dois Davenport, Charity fundraiser. Pt left floor in stable condition, IV sight WDL, on telemetry, no acute distress.

## 2013-05-11 NOTE — H&P (Signed)
Megan Mcknight is an 52 y.o. female.   Chief Complaint: Dyspnea  HPI:  Megan Mcknight is a 52 yo [place] American female with known history of hyperlipidemia.  She presented to [facility] Emergency Department on 05/06/2013 with a complaint of shortness of breath.  The patient stated this was of sudden onset and was associated with dry cough.  She denied any fevers, chills, or hemoptysis.  EKG obtained showed the patient to be in Atrial Fibrillation with RVR and she was subsequently treated with Cardizem.  Cardiac enzymes were negative.  CXR obtained showed evidence of bilateral opacities and the patient was placed on Vancomycin for treatment.  The patient was admitted by medicine for further care.  Patients rhythm converted to NSR and she was transitioned to an oral medication regimen.  Despite continued antibiotics the patient's CXR showed progression of opacities.  It was felt CT scan should be performed.  This revealed diffuse pulmonary nodules, enlarged bilateral mediastinal lymph nodes and lesions within the liver that are all concerning for possible metastatic disease.  It was felt the patient would benefit further evaluation by a Thoracic surgeon and the patient was transferred to [facility] for further workup.  Currently the patient states she is feeling better.  She denies further shortness of breath.  She denies recent weight loss and night sweats.  Finally she denies history of any previous cancers.    Past Medical History  Diagnosis Date  . Hyperlipidemia   . Nerve pain     Left leg  . Seasonal allergies   . Acute bronchitis   . Colon polyps 02/2013    Per colonoscopy  . Diverticulosis 02/2013    Per colonoscopy  . Former light tobacco smoker     Past Surgical History  Procedure Laterality Date  . Partial hysterectomy    . Bones removed from baby toes    . Colonoscopy  02/16/2012    Procedure: COLONOSCOPY;  Surgeon: Corbin Ade, MD;  Location: [facility];  Service: Endoscopy;   Laterality: N/A;  1:30  . Abdominal hysterectomy      Family History  Problem Relation Age of Onset  . Heart defect      family history   . Asthma      family history   . Colon polyps Mother    Social History:  reports that she has been smoking Cigarettes.  She has been smoking about 0.50 packs per day. She does not have any smokeless tobacco history on file. She reports that  drinks alcohol. She reports that she does not use illicit drugs.  Allergies:  Allergies  Allergen Reactions  . Iodine Anaphylaxis    Told to avoid due to shellfish allergy.  Marland Kitchen Shellfish-Derived Products Anaphylaxis and Swelling    Eyes swelling, scratchy throat, bumps on lips.    Medications Prior to Admission  Medication Sig Dispense Refill  . albuterol (PROVENTIL HFA;VENTOLIN HFA) 108 (90 BASE) MCG/ACT inhaler Inhale 2 puffs into the lungs every 6 (six) hours as needed for wheezing.      Marland Kitchen ibuprofen (ADVIL,MOTRIN) 200 MG tablet Take 400 mg by mouth every 6 (six) hours as needed for pain.      . montelukast (SINGULAIR) 10 MG tablet Take 10 mg by mouth daily as needed (Allergies).       . rosuvastatin (CRESTOR) 10 MG tablet Take 10 mg by mouth daily.        Results for orders placed during the hospital encounter  of 05/06/13 (from the past 48 hour(s))  CBC     Status: Abnormal   Collection Time    05/10/13  5:56 AM      Result Value Range   WBC 13.7 (*) 4.0 - 10.5 K/uL   RBC 3.65 (*) 3.87 - 5.11 MIL/uL   Hemoglobin 9.4 (*) 12.0 - 15.0 g/dL   HCT 84.1 (*) 32.4 - 40.1 %   MCV 81.9  78.0 - 100.0 fL   MCH 25.8 (*) 26.0 - 34.0 pg   MCHC 31.4  30.0 - 36.0 g/dL   RDW 02.7  25.3 - 66.4 %   Platelets 308  150 - 400 K/uL  BASIC METABOLIC PANEL     Status: Abnormal   Collection Time    05/10/13  5:56 AM      Result Value Range   Sodium 141  135 - 145 mEq/L   Potassium 4.0  3.5 - 5.1 mEq/L   Chloride 106  96 - 112 mEq/L   CO2 27  19 - 32 mEq/L   Glucose, Bld 109 (*) 70 - 99 mg/dL   BUN 8  6 - 23 mg/dL    Creatinine, Ser 4.03  0.50 - 1.10 mg/dL   Calcium 9.1  8.4 - 47.4 mg/dL   GFR calc non Af Amer >90  >90 mL/min   GFR calc Af Amer >90  >90 mL/min   Comment:            The eGFR has been calculated     using the CKD EPI equation.     This calculation has not been     validated in all clinical     situations.     eGFR's persistently     <90 mL/min signify     possible Chronic Kidney Disease.  VANCOMYCIN, TROUGH     Status: Abnormal   Collection Time    05/10/13  8:52 AM      Result Value Range   Vancomycin Tr 6.5 (*) 10.0 - 20.0 ug/mL  BASIC METABOLIC PANEL     Status: None   Collection Time    05/11/13  5:27 AM      Result Value Range   Sodium 141  135 - 145 mEq/L   Potassium 3.7  3.5 - 5.1 mEq/L   Chloride 106  96 - 112 mEq/L   CO2 26  19 - 32 mEq/L   Glucose, Bld 95  70 - 99 mg/dL   BUN 6  6 - 23 mg/dL   Creatinine, Ser 2.59  0.50 - 1.10 mg/dL   Calcium 8.9  8.4 - 56.3 mg/dL   GFR calc non Af Amer >90  >90 mL/min   GFR calc Af Amer >90  >90 mL/min   Comment:            The eGFR has been calculated     using the CKD EPI equation.     This calculation has not been     validated in all clinical     situations.     eGFR's persistently     <90 mL/min signify     possible Chronic Kidney Disease.   Ct Chest Wo Contrast  05/10/2013   *RADIOLOGY REPORT*  Clinical Data: Abnormal chest radiograph  CT CHEST WITHOUT CONTRAST  Technique:  Multidetector CT imaging of the chest was performed following the standard protocol without IV contrast.  Comparison: None  Findings: Lungs/pleura: Small pleural effusions are identified.   Innumerable pulmonary  nodules are identified throughout both lungs.  Some of these nodules are coalescent and others are clustered.  Index nodule within the right upper lobe measures 1 cm, image number 19/series 3.  Index nodule within the left upper lobe measures 1 cm, image 26/series 3.  Right middle lobe nodule measures 1.2 cm. Airspace consolidation and  atelectasis is noted within the posterior lung bases posteriorly.  Heart/Mediastinum: Multiple enlarged mediastinal lymph nodes are identified bilaterally.  Index prevascular lymph node measures 1.5 cm, image 20/series 2.  Index subcarinal lymph node measures 2.1 cm, image 25/series 2. At the thoracic inlet there is a right sided lymph node measuring 1 cm.  The heart is mildly enlarged.  There is no pericardial effusion.  Upper abdomen: Incidental imaging through the upper abdomen shows multi focal areas of low attenuation within the liver parenchyma. Index lesion along the anterior right hepatic lobe measures 3.2 cm, image 45/series 2.  Left hepatic lobe lesion measures 3.6 cm, image 49/series 2.  The adrenal glands are both of the adrenal glands are grossly unremarkable.  Bones/Musculoskeletal:  No aggressive lytic or sclerotic bone lesions identified.  IMPRESSION:  1.  Diffuse pulmonary nodules are worrisome for metastatic disease. 2. Enlarged bilateral mediastinal lymph nodes are concerning for metastatic adenopathy. 3.  Multi focal low attenuation lesions within the liver are concerning for metastatic disease.   Original Report Authenticated By: Signa Kell, M.D.    Review of Systems  Constitutional: Negative for fever, chills and weight loss.  HENT: Negative.   Eyes: Negative.   Respiratory: Positive for cough and shortness of breath.   Cardiovascular: Positive for palpitations.  Gastrointestinal: Negative for nausea, vomiting and diarrhea.  Genitourinary: Negative.   Musculoskeletal: Negative.   Skin: Negative for itching and rash.  Neurological: Negative.   Endo/Heme/Allergies: Negative.   Psychiatric/Behavioral: Negative.     Blood pressure 125/76, pulse 79, temperature 98.4 F (36.9 C), temperature source Oral, resp. rate 20, height 5\' 8"  (1.727 m), weight 222 lb 14.2 oz (101.1 kg), SpO2 99.00%. Physical Exam  Constitutional: She is oriented to person, place, and time. She appears  well-developed and well-nourished.  HENT:  Head: Normocephalic.  Eyes: Pupils are equal, round, and reactive to light.  Neck: Neck supple.  Cardiovascular: Normal rate and regular rhythm.   Respiratory: Breath sounds normal.  GI: Bowel sounds are normal. She exhibits no distension. There is no tenderness.  Neurological: She is alert and oriented to person, place, and time.  Skin: Skin is warm and dry.     Assessment/Plan  1. Admit to inpatient 2. Multiple Pulmonary Nodules, Mediastinal Adenopathy-concerning for metastatic disease 3. Hyperlipidemia- continue Lipitor 4. Suspected pneumonia- afebrile, + Leukocytosis on Levaquin, Vanc 5. Dispo- concern for metastatic disease patient likely for Bronch, EBUS, Mediastinoscopy on Friday  Aviyon Hocevar, Denny Peon 05/11/2013, 12:13 PM

## 2013-05-11 NOTE — Progress Notes (Signed)
Her CT shows what appears to be widely metastatic cancer of unknown source. She has multiple pulmonary nodules nodules in her liver and mediastinal adenopathy. I discussed that with Dr. Felecia Shelling and with the patient and all are in agreement that we need to get a diagnosis and she's going to be sent to [facility] with Dr. Dorris Fetch for further evaluation

## 2013-05-12 LAB — COMPREHENSIVE METABOLIC PANEL
ALT: 33 U/L (ref 0–35)
AST: 24 U/L (ref 0–37)
Albumin: 2.3 g/dL — ABNORMAL LOW (ref 3.5–5.2)
Alkaline Phosphatase: 125 U/L — ABNORMAL HIGH (ref 39–117)
BUN: 8 mg/dL (ref 6–23)
CO2: 24 mEq/L (ref 19–32)
Calcium: 8.9 mg/dL (ref 8.4–10.5)
Chloride: 102 mEq/L (ref 96–112)
Creatinine, Ser: 0.73 mg/dL (ref 0.50–1.10)
GFR calc Af Amer: >90 mL/min (ref >90)
GFR calc non Af Amer: >90 mL/min (ref >90)
Glucose, Bld: 107 mg/dL — ABNORMAL HIGH (ref 70–99)
Potassium: 3.8 mEq/L (ref 3.5–5.1)
Sodium: 137 mEq/L (ref 135–145)
Total Bilirubin: 0.2 mg/dL — ABNORMAL LOW (ref 0.3–1.2)
Total Protein: 7 g/dL (ref 6.0–8.3)

## 2013-05-12 LAB — TYPE AND SCREEN
ABO/RH(D): A POS
Antibody Screen: NEGATIVE

## 2013-05-12 LAB — PROTIME-INR
INR: 1.31 (ref 0.00–1.49)
Prothrombin Time: 16 seconds — ABNORMAL HIGH (ref 11.6–15.2)

## 2013-05-12 LAB — CBC
HCT: 28.5 % — ABNORMAL LOW (ref 36.0–46.0)
Hemoglobin: 9.2 g/dL — ABNORMAL LOW (ref 12.0–15.0)
MCH: 25.6 pg — ABNORMAL LOW (ref 26.0–34.0)
MCHC: 32.3 g/dL (ref 30.0–36.0)
MCV: 79.4 fL (ref 78.0–100.0)
Platelets: 340 10*3/uL (ref 150–400)
RBC: 3.59 MIL/uL — ABNORMAL LOW (ref 3.87–5.11)
RDW: 13.6 % (ref 11.5–15.5)
WBC: 13.8 10*3/uL — ABNORMAL HIGH (ref 4.0–10.5)

## 2013-05-12 LAB — VANCOMYCIN, TROUGH: Vancomycin Tr: 15.5 ug/mL (ref 10.0–20.0)

## 2013-05-12 LAB — APTT: aPTT: 34 seconds (ref 24–37)

## 2013-05-12 NOTE — Progress Notes (Addendum)
[address]       [name] [address]             [phone]         Procedure(s) (LRB): VIDEO BRONCHOSCOPY WITH ENDOBRONCHIAL ULTRASOUND (N/A) MEDIASTINOSCOPY (N/A) Subjective: Feels some DOE, mild nausea at times  Objective  Telemetry sinus  Temp:  [97.9 F (36.6 C)-101.9 F (38.8 C)] 98.9 F (37.2 C) (05/29 0548) Pulse Rate:  [79-107] 84 (05/29 0548) Resp:  [19-20] 19 (05/29 0548) BP: (123-154)/(63-85) 123/63 mmHg (05/29 0548) SpO2:  [97 %-100 %] 97 % (05/29 0548) Weight:  [222 lb 14.2 oz (101.1 kg)-223 lb 5.2 oz (101.3 kg)] 223 lb 5.2 oz (101.3 kg) (05/29 0548)   Intake/Output Summary (Last 24 hours) at 05/12/13 0756 Last data filed at 05/12/13 0552  Gross per 24 hour  Intake   1300 ml  Output   3000 ml  Net  -1700 ml       General appearance: alert, cooperative and no distress Heart: regular rate and rhythm Lungs: fair air exchange throughout, no wheeze or crackles Abdomen: soft, nontender Extremities: no edema  Lab Results:  Recent Labs  05/10/13 0556 05/11/13 0527  NA 141 141  K 4.0 3.7  CL 106 106  CO2 27 26  GLUCOSE 109* 95  BUN 8 6  CREATININE 0.71 0.68  CALCIUM 9.1 8.9   No results found for this basename: AST, ALT, ALKPHOS, BILITOT, PROT, ALBUMIN,  in the last 72 hours No results found for this basename: LIPASE, AMYLASE,  in the last 72 hours  Recent Labs  05/10/13 0556  WBC 13.7*  HGB 9.4*  HCT 29.9*  MCV 81.9  PLT 308   No results found for this basename: CKTOTAL, CKMB, TROPONINI,  in the last 72 hours No components found with this basename: POCBNP,  No results found for this basename: DDIMER,  in the last 72 hours No results found for this basename: HGBA1C,  in the last 72 hours No results found for this basename: CHOL, HDL, LDLCALC, TRIG, CHOLHDL,  in the last 72 hours No results found for this basename: TSH, T4TOTAL, FREET3, T3FREE, THYROIDAB,  in the last 72 hours No results found for this  basename: VITAMINB12, FOLATE, FERRITIN, TIBC, IRON, RETICCTPCT,  in the last 72 hours  Medications: Scheduled . atorvastatin  20 mg Oral q1800  . benzonatate  100 mg Oral TID  . enoxaparin  40 mg Subcutaneous Q24H  . levofloxacin (LEVAQUIN) IV  750 mg Intravenous Q24H  . metoprolol tartrate  25 mg Oral TID  . senna  1 tablet Oral QHS  . vancomycin  1,500 mg Intravenous Q8H     Radiology/Studies:  Ct Chest Wo Contrast  05/10/2013   *RADIOLOGY REPORT*  Clinical Data: Abnormal chest radiograph  CT CHEST WITHOUT CONTRAST  Technique:  Multidetector CT imaging of the chest was performed following the standard protocol without IV contrast.  Comparison: None  Findings: Lungs/pleura: Small pleural effusions are identified.   Innumerable pulmonary nodules are identified throughout both lungs.  Some of these nodules are coalescent and others are clustered.  Index nodule within the right upper lobe measures 1 cm, image number 19/series 3.  Index nodule within the left upper lobe measures 1 cm, image 26/series 3.  Right middle lobe nodule measures 1.2 cm. Airspace consolidation and atelectasis is noted within the posterior lung bases posteriorly.  Heart/Mediastinum: Multiple enlarged mediastinal lymph nodes are identified bilaterally.  Index prevascular lymph  node measures 1.5 cm, image 20/series 2.  Index subcarinal lymph node measures 2.1 cm, image 25/series 2. At the thoracic inlet there is a right sided lymph node measuring 1 cm.  The heart is mildly enlarged.  There is no pericardial effusion.  Upper abdomen: Incidental imaging through the upper abdomen shows multi focal areas of low attenuation within the liver parenchyma. Index lesion along the anterior right hepatic lobe measures 3.2 cm, image 45/series 2.  Left hepatic lobe lesion measures 3.6 cm, image 49/series 2.  The adrenal glands are both of the adrenal glands are grossly unremarkable.  Bones/Musculoskeletal:  No aggressive lytic or sclerotic bone  lesions identified.  IMPRESSION:  1.  Diffuse pulmonary nodules are worrisome for metastatic disease. 2. Enlarged bilateral mediastinal lymph nodes are concerning for metastatic adenopathy. 3.  Multi focal low attenuation lesions within the liver are concerning for metastatic disease.   Original Report Authenticated By: Signa Kell, M.D.    INR: Will add last result for INR, ABG once components are confirmed Will add last 4 CBG results once components are confirmed  Assessment/Plan: S/P Procedure(s) (LRB): VIDEO BRONCHOSCOPY WITH ENDOBRONCHIAL ULTRASOUND (N/A) MEDIASTINOSCOPY (N/A)  1. Clinically stable, febrile at times, conts current abx. vanc and levaquin 2 plans for biopsies as outlined 3 oxygenating well and comfortable without pain  LOS: 6 days    GOLD,WAYNE E 5/29/20147:56 AM  Patient seen and examined. Agree with above  For bronch, EBUS, possible mediastinoscopy in AM  All questions answered

## 2013-05-12 NOTE — Progress Notes (Signed)
ANTIBIOTIC CONSULT NOTE-PROGRESS NOTE  Pharmacy Consult for : Vancomycin Indication: pneumonia  Hospital Problems Principal Problem:   Shortness of breath Active Problems:   Atrial fibrillation with RVR   Bilateral pneumonia   Acute renal insufficiency   Normocytic anemia  Vitals: BP 119/58  Pulse 90  Temp(Src) 99.9 F (37.7 C) (Oral)  Resp 19  Ht 5\' 8"  (1.727 m)  Wt 223 lb 5.2 oz (101.3 kg)  BMI 33.96 kg/m2  SpO2 100%  Labs:  Recent Labs  05/10/13 0556 05/11/13 0527  WBC 13.7*  --   HGB 9.4*  --   PLT 308  --   CREATININE 0.71 0.68   Estimated Creatinine Clearance: 103.6 ml/min (by C-G formula based on Cr of 0.68).   Microbiology: Recent Results (from the past 720 hour(s))  MRSA PCR SCREENING     Status: None   Collection Time    05/06/13 12:04 PM      Result Value Range Status   MRSA by PCR NEGATIVE  NEGATIVE Final   Vancomycin Trough = 15.5 mcg/ml  Anti-infectives Anti-infectives   Start     Dose/Rate Route Frequency Ordered Stop   05/10/13 1600  vancomycin (VANCOCIN) 1,500 mg in sodium chloride 0.9 % 500 mL IVPB     1,500 mg 250 mL/hr over 120 Minutes Intravenous Every 8 hours 05/10/13 1148     05/08/13 2200  vancomycin (VANCOCIN) 1,250 mg in sodium chloride 0.9 % 250 mL IVPB  Status:  Discontinued     1,250 mg 166.7 mL/hr over 90 Minutes Intravenous Every 12 hours 05/08/13 1439 05/10/13 1147   05/07/13 1100  levofloxacin (LEVAQUIN) IVPB 750 mg     750 mg 100 mL/hr over 90 Minutes Intravenous Every 24 hours 05/06/13 1248     05/06/13 1300  vancomycin (VANCOCIN) IVPB 1000 mg/200 mL premix  Status:  Discontinued     1,000 mg 200 mL/hr over 60 Minutes Intravenous Every 12 hours 05/06/13 1241 05/08/13 1439   05/06/13 1130  levofloxacin (LEVAQUIN) IVPB 750 mg     750 mg 100 mL/hr over 90 Minutes Intravenous  Once 05/06/13 1120 05/06/13 1304      Assessment:  Day # 7 Vancomycin, Levaquin for PNA.    Vancomycin trough within therapeutic range for  PNA coverage, 15.5 mcg/ml 99.9 F (37.7 C) (Oral) , WBC 13.7.  Goal of Therapy:   Vancomycin trough level 15-20 mcg/ml  Plan:   Continue Vancomycin at current dose and schedule.  Laurena Bering, Pharm.D.  05/12/2013 6:45 PM

## 2013-05-13 ENCOUNTER — Inpatient Hospital Stay (HOSPITAL_COMMUNITY): Payer: PRIVATE HEALTH INSURANCE | Admitting: Anesthesiology

## 2013-05-13 ENCOUNTER — Encounter (HOSPITAL_COMMUNITY): Payer: Self-pay | Admitting: Anesthesiology

## 2013-05-13 ENCOUNTER — Encounter (HOSPITAL_COMMUNITY)
Admission: EM | Disposition: A | Discharge status: Home / Self Care | Payer: Self-pay | Source: Home / Self Care | Attending: Internal Medicine

## 2013-05-13 ENCOUNTER — Inpatient Hospital Stay (HOSPITAL_COMMUNITY): Payer: PRIVATE HEALTH INSURANCE

## 2013-05-13 DIAGNOSIS — R599 Enlarged lymph nodes, unspecified: Secondary | ICD-10-CM

## 2013-05-13 DIAGNOSIS — D381 Neoplasm of uncertain behavior of trachea, bronchus and lung: Secondary | ICD-10-CM

## 2013-05-13 HISTORY — PX: VIDEO BRONCHOSCOPY WITH ENDOBRONCHIAL ULTRASOUND: SHX6177

## 2013-05-13 HISTORY — PX: MEDIASTINOSCOPY: SHX5086

## 2013-05-13 LAB — ABO/RH: ABO/RH(D): A POS

## 2013-05-13 SURGERY — BRONCHOSCOPY, WITH EBUS
Anesthesia: General | Wound class: Clean Contaminated

## 2013-05-13 MED ORDER — HYDROMORPHONE HCL PF 1 MG/ML IJ SOLN: 0.2500 mg | INTRAMUSCULAR | Status: DC | PRN

## 2013-05-13 MED ORDER — FENTANYL CITRATE 0.05 MG/ML IJ SOLN
INTRAMUSCULAR | Status: DC | PRN
  Administered 2013-05-13: 100 ug via INTRAVENOUS
  Administered 2013-05-13: 50 ug via INTRAVENOUS

## 2013-05-13 MED ORDER — PHENYLEPHRINE HCL 10 MG/ML IJ SOLN
10.0000 mg | INTRAVENOUS | Status: DC | PRN
  Administered 2013-05-13: 20 ug/min via INTRAVENOUS

## 2013-05-13 MED ORDER — OXYCODONE HCL 5 MG PO TABS
5.0000 mg | ORAL_TABLET | ORAL | Status: DC | PRN
  Administered 2013-05-17 – 2013-05-20 (×9): 10 mg via ORAL
  Filled 2013-05-13 (×9): qty 2

## 2013-05-13 MED ORDER — PROMETHAZINE HCL 25 MG/ML IJ SOLN: 6.2500 mg | INTRAMUSCULAR | Status: DC | PRN

## 2013-05-13 MED ORDER — GLYCOPYRROLATE 0.2 MG/ML IJ SOLN
INTRAMUSCULAR | Status: DC | PRN
  Administered 2013-05-13: .6 mg via INTRAVENOUS

## 2013-05-13 MED ORDER — LACTATED RINGERS IV SOLN
INTRAVENOUS | Status: DC | PRN
  Administered 2013-05-13 (×2): via INTRAVENOUS

## 2013-05-13 MED ORDER — OXYCODONE HCL 5 MG PO TABS: 5.0000 mg | ORAL_TABLET | Freq: Once | ORAL | Status: DC | PRN

## 2013-05-13 MED ORDER — PROPOFOL 10 MG/ML IV BOLUS
INTRAVENOUS | Status: DC | PRN
  Administered 2013-05-13: 200 mg via INTRAVENOUS

## 2013-05-13 MED ORDER — HEMOSTATIC AGENTS (NO CHARGE) OPTIME
TOPICAL | Status: DC | PRN
  Administered 2013-05-13: 1 via TOPICAL

## 2013-05-13 MED ORDER — LIDOCAINE HCL (CARDIAC) 20 MG/ML IV SOLN
INTRAVENOUS | Status: DC | PRN
  Administered 2013-05-13: 20 mg via INTRAVENOUS

## 2013-05-13 MED ORDER — 0.9 % SODIUM CHLORIDE (POUR BTL) OPTIME
TOPICAL | Status: DC | PRN
  Administered 2013-05-13: 1000 mL

## 2013-05-13 MED ORDER — NEOSTIGMINE METHYLSULFATE 1 MG/ML IJ SOLN
INTRAMUSCULAR | Status: DC | PRN
  Administered 2013-05-13: 5 mg via INTRAVENOUS

## 2013-05-13 MED ORDER — MEPERIDINE HCL 25 MG/ML IJ SOLN: 6.2500 mg | INTRAMUSCULAR | Status: DC | PRN

## 2013-05-13 MED ORDER — ALBUTEROL SULFATE HFA 108 (90 BASE) MCG/ACT IN AERS
2.0000 | INHALATION_SPRAY | Freq: Four times a day (QID) | RESPIRATORY_TRACT | Status: DC | PRN
  Filled 2013-05-13: qty 6.7

## 2013-05-13 MED ORDER — MIDAZOLAM HCL 5 MG/5ML IJ SOLN
INTRAMUSCULAR | Status: DC | PRN
  Administered 2013-05-13: 2 mg via INTRAVENOUS

## 2013-05-13 MED ORDER — ONDANSETRON HCL 4 MG/2ML IJ SOLN
INTRAMUSCULAR | Status: DC | PRN
  Administered 2013-05-13: 4 mg via INTRAVENOUS

## 2013-05-13 MED ORDER — LACTATED RINGERS IV SOLN
INTRAVENOUS | Status: DC
  Administered 2013-05-13 – 2013-05-14 (×2): via INTRAVENOUS

## 2013-05-13 MED ORDER — MIDAZOLAM HCL 2 MG/2ML IJ SOLN: 0.5000 mg | Freq: Once | INTRAMUSCULAR | Status: DC | PRN

## 2013-05-13 MED ORDER — ROCURONIUM BROMIDE 100 MG/10ML IV SOLN
INTRAVENOUS | Status: DC | PRN
  Administered 2013-05-13: 50 mg via INTRAVENOUS
  Administered 2013-05-13 (×3): 10 mg via INTRAVENOUS

## 2013-05-13 MED ORDER — OXYCODONE HCL 5 MG/5ML PO SOLN: 5.0000 mg | Freq: Once | ORAL | Status: DC | PRN

## 2013-05-13 SURGICAL SUPPLY — 61 items
APPLIER CLIP LOGIC TI 5 (MISCELLANEOUS) IMPLANT
BLADE SURG 15 STRL LF DISP TIS (BLADE) ×1 IMPLANT
BLADE SURG 15 STRL SS (BLADE) ×1
BRUSH CYTOL CELLEBRITY 1.5X140 (MISCELLANEOUS) IMPLANT
CANISTER SUCTION 2500CC (MISCELLANEOUS) ×2 IMPLANT
CLIP TI MEDIUM 6 (CLIP) IMPLANT
CLOTH BEACON ORANGE TIMEOUT ST (SAFETY) ×2 IMPLANT
CONT SPEC 4OZ CLIKSEAL STRL BL (MISCELLANEOUS) ×20 IMPLANT
COTTONBALL LRG STERILE PKG (GAUZE/BANDAGES/DRESSINGS) IMPLANT
COVER SURGICAL LIGHT HANDLE (MISCELLANEOUS) ×2 IMPLANT
COVER TABLE BACK 60X90 (DRAPES) ×2 IMPLANT
DERMABOND ADHESIVE PROPEN (GAUZE/BANDAGES/DRESSINGS) ×1
DERMABOND ADVANCED (GAUZE/BANDAGES/DRESSINGS) ×1
DERMABOND ADVANCED .7 DNX12 (GAUZE/BANDAGES/DRESSINGS) ×1 IMPLANT
DERMABOND ADVANCED .7 DNX6 (GAUZE/BANDAGES/DRESSINGS) ×1 IMPLANT
DRAPE CHEST BREAST 15X10 FENES (DRAPES) ×2 IMPLANT
ELECT REM PT RETURN 9FT ADLT (ELECTROSURGICAL) ×2
ELECTRODE REM PT RTRN 9FT ADLT (ELECTROSURGICAL) ×1 IMPLANT
FILTER STRAW FLUID ASPIR (MISCELLANEOUS) IMPLANT
FORCEPS BIOP RJ4 1.8 (CUTTING FORCEPS) IMPLANT
GAUZE SPONGE 4X4 16PLY XRAY LF (GAUZE/BANDAGES/DRESSINGS) ×2 IMPLANT
GLOVE EUDERMIC 7 POWDERFREE (GLOVE) ×4 IMPLANT
GLOVE SURG SIGNA 7.5 PF LTX (GLOVE) ×2 IMPLANT
GOWN PREVENTION PLUS XLARGE (GOWN DISPOSABLE) ×4 IMPLANT
GOWN STRL NON-REIN LRG LVL3 (GOWN DISPOSABLE) ×4 IMPLANT
HEMOSTAT SURGICEL 2X14 (HEMOSTASIS) ×2 IMPLANT
KIT BASIN OR (CUSTOM PROCEDURE TRAY) ×2 IMPLANT
KIT ROOM TURNOVER OR (KITS) ×4 IMPLANT
MARKER SKIN DUAL TIP RULER LAB (MISCELLANEOUS) ×2 IMPLANT
NEEDLE 22X1 1/2 (OR ONLY) (NEEDLE) IMPLANT
NEEDLE BIOPSY TRANSBRONCH 21G (NEEDLE) IMPLANT
NEEDLE BLUNT 18X1 FOR OR ONLY (NEEDLE) IMPLANT
NEEDLE SYS SONOTIP II EBUSTBNA (NEEDLE) ×2 IMPLANT
NS IRRIG 1000ML POUR BTL (IV SOLUTION) ×4 IMPLANT
OIL SILICONE PENTAX (PARTS (SERVICE/REPAIRS)) ×2 IMPLANT
PACK SURGICAL SETUP 50X90 (CUSTOM PROCEDURE TRAY) ×2 IMPLANT
PAD ARMBOARD 7.5X6 YLW CONV (MISCELLANEOUS) ×4 IMPLANT
PENCIL BUTTON HOLSTER BLD 10FT (ELECTRODE) ×2 IMPLANT
SPONGE GAUZE 4X4 12PLY (GAUZE/BANDAGES/DRESSINGS) ×2 IMPLANT
SPONGE INTESTINAL PEANUT (DISPOSABLE) ×2 IMPLANT
SUT MNCRL AB 4-0 PS2 18 (SUTURE) ×2 IMPLANT
SUT SILK 2 0 TIES 10X30 (SUTURE) IMPLANT
SUT VIC AB 2-0 CT1 27 (SUTURE) ×1
SUT VIC AB 2-0 CT1 TAPERPNT 27 (SUTURE) ×1 IMPLANT
SUT VIC AB 3-0 SH 18 (SUTURE) ×2 IMPLANT
SUT VIC AB 3-0 SH 27 (SUTURE)
SUT VIC AB 3-0 SH 27X BRD (SUTURE) IMPLANT
SUT VICRYL 4-0 PS2 18IN ABS (SUTURE) ×2 IMPLANT
SWAB COLLECTION DEVICE MRSA (MISCELLANEOUS) IMPLANT
SYR 20CC LL (SYRINGE) ×2 IMPLANT
SYR 20ML ECCENTRIC (SYRINGE) ×2 IMPLANT
SYR 5ML LL (SYRINGE) ×2 IMPLANT
SYR 5ML LUER SLIP (SYRINGE) ×2 IMPLANT
SYR CONTROL 10ML LL (SYRINGE) IMPLANT
SYRINGE 10CC LL (SYRINGE) ×2 IMPLANT
TOWEL OR 17X24 6PK STRL BLUE (TOWEL DISPOSABLE) ×2 IMPLANT
TOWEL OR 17X26 10 PK STRL BLUE (TOWEL DISPOSABLE) ×2 IMPLANT
TRAP SPECIMEN MUCOUS 40CC (MISCELLANEOUS) ×2 IMPLANT
TUBE ANAEROBIC SPECIMEN COL (MISCELLANEOUS) IMPLANT
TUBE CONNECTING 12X1/4 (SUCTIONS) ×4 IMPLANT
WATER STERILE IRR 1000ML POUR (IV SOLUTION) ×2 IMPLANT

## 2013-05-13 NOTE — Brief Op Note (Signed)
05/06/2013 - 05/13/2013  12:37 PM  PATIENT:  Megan Mcknight  52 y.o. female  PRE-OPERATIVE DIAGNOSIS:  Mediastinal adenopathy, Bilateral pulmonary nodules  POST-OPERATIVE DIAGNOSIS:  Mediastinal adenopathy, Bilateral pulmonary nodules  PROCEDURE:  Procedure(s): VIDEO BRONCHOSCOPY WITH ENDOBRONCHIAL ULTRASOUND (N/A) MEDIASTINOSCOPY (N/A)  SURGEON:  Surgeon(s) and Role:    * Loreli Slot, MD - Primary  ANESTHESIA:   general  EBL:  Total I/O In: 1000 [I.V.:1000] Out: -   BLOOD ADMINISTERED:none  DRAINS: none   LOCAL MEDICATIONS USED:  NONE  SPECIMEN:  Source of Specimen:  BAL and mediastinal lymph nodes  DISPOSITION OF SPECIMEN:  Path and Micro (fungal, AFB)  COUNTS:  YES  PLAN OF CARE: inpatient  PATIENT DISPOSITION:  PACU - hemodynamically stable.   Delay start of Pharmacological VTE agent (>24hrs) due to surgical blood loss or risk of bleeding: yes

## 2013-05-13 NOTE — Anesthesia Postprocedure Evaluation (Signed)
  Anesthesia Post-op Note  Patient: Megan Mcknight  Procedure(s) Performed: Procedure(s): VIDEO BRONCHOSCOPY WITH ENDOBRONCHIAL ULTRASOUND (N/A) MEDIASTINOSCOPY (N/A)  Patient Location: PACU  Anesthesia Type:General  Level of Consciousness: awake, alert , oriented and patient cooperative  Airway and Oxygen Therapy: Patient Spontanous Breathing and Patient connected to nasal cannula oxygen  Post-op Pain: none  Post-op Assessment: Post-op Vital signs reviewed, Patient's Cardiovascular Status Stable, Respiratory Function Stable, Patent Airway, No signs of Nausea or vomiting and Pain level controlled  Post-op Vital Signs: Reviewed and stable  Complications: No apparent anesthesia complications

## 2013-05-13 NOTE — Interval H&P Note (Signed)
History and Physical Interval Note:  05/13/2013 9:09 AM  Megan Mcknight  has presented today for surgery, with the diagnosis of adenopathy  The various methods of treatment have been discussed with the patient and family. After consideration of risks, benefits and other options for treatment, the patient has consented to  Procedure(s): VIDEO BRONCHOSCOPY WITH ENDOBRONCHIAL ULTRASOUND (N/A) MEDIASTINOSCOPY (N/A) as a surgical intervention .  The patient's history has been reviewed, patient examined, no change in status, stable for surgery.  I have reviewed the patient's chart and labs.  Questions were answered to the patient's satisfaction.     Itzel Mckibbin C

## 2013-05-13 NOTE — Transfer of Care (Signed)
Immediate Anesthesia Transfer of Care Note  Patient: Megan Mcknight  Procedure(s) Performed: Procedure(s): VIDEO BRONCHOSCOPY WITH ENDOBRONCHIAL ULTRASOUND (N/A) MEDIASTINOSCOPY (N/A)  Patient Location: PACU  Anesthesia Type:General  Level of Consciousness: awake, alert , oriented and patient cooperative  Airway & Oxygen Therapy: Patient Spontanous Breathing and Patient connected to face mask oxygen  Post-op Assessment: Post -op Vital signs reviewed and stable, Patient moving all extremities and Patient moving all extremities X 4  Post vital signs: Reviewed and stable  Complications: No apparent anesthesia complications

## 2013-05-13 NOTE — Anesthesia Procedure Notes (Signed)
Procedure Name: Intubation Date/Time: 05/13/2013 9:35 AM Performed by: Coralee Rud Pre-anesthesia Checklist: Patient identified, Emergency Drugs available, Suction available, Patient being monitored and Timeout performed Patient Re-evaluated:Patient Re-evaluated prior to inductionOxygen Delivery Method: Circle system utilized Intubation Type: IV induction Laryngoscope Size: Miller and 3 Grade View: Grade I Tube type: Oral Tube size: 8.5 mm Number of attempts: 1 Airway Equipment and Method: Stylet and LTA kit utilized Placement Confirmation: ETT inserted through vocal cords under direct vision and positive ETCO2 Secured at: 23 cm Tube secured with: Tape Dental Injury: Teeth and Oropharynx as per pre-operative assessment

## 2013-05-13 NOTE — Progress Notes (Signed)
Pt. Arrived to holding with a pair of earrings in. Pt. Removed them and floor  Was notified to come pick them up. The patient's nurse, Shanda Bumps arrived and took the earring back to the nursing floor  ([id number]).

## 2013-05-13 NOTE — Anesthesia Preprocedure Evaluation (Signed)
Anesthesia Evaluation  Patient identified by MRN, date of birth, ID band Patient awake    Reviewed: Allergy & Precautions, H&P , NPO status , Patient's Chart, lab work & pertinent test results  History of Anesthesia Complications Negative for: history of anesthetic complications  Airway Mallampati: I TM Distance: >3 FB Neck ROM: Full    Dental  (+) Teeth Intact, Dental Advisory Given and Missing   Pulmonary shortness of breath, pneumonia -, resolved, former smoker (quit a month),  Lung lesions breath sounds clear to auscultation  Pulmonary exam normal       Cardiovascular + dysrhythmias (5/14 ECHO: EF 75%, normal LVF and valves) Atrial Fibrillation Rhythm:Regular Rate:Normal     Neuro/Psych    GI/Hepatic GERD-  Medicated and Controlled,Liver nodules   Endo/Other  Morbid obesity  Renal/GU Renal InsufficiencyRenal disease (resolved renal insuff)     Musculoskeletal   Abdominal (+) + obese,   Peds  Hematology   Anesthesia Other Findings   Reproductive/Obstetrics                           Anesthesia Physical Anesthesia Plan  ASA: III  Anesthesia Plan: General   Post-op Pain Management:    Induction: Intravenous  Airway Management Planned: Oral ETT  Additional Equipment:   Intra-op Plan:   Post-operative Plan: Extubation in OR  Informed Consent: I have reviewed the patients History and Physical, chart, labs and discussed the procedure including the risks, benefits and alternatives for the proposed anesthesia with the patient or authorized representative who has indicated his/her understanding and acceptance.   Dental advisory given  Plan Discussed with: Surgeon and CRNA  Anesthesia Plan Comments: (Plan routine monitors, GETA)        Anesthesia Quick Evaluation

## 2013-05-14 ENCOUNTER — Inpatient Hospital Stay (HOSPITAL_COMMUNITY): Payer: PRIVATE HEALTH INSURANCE

## 2013-05-14 NOTE — Progress Notes (Addendum)
                    [address]            [name] [address]          [phone]     1 Day Post-Op Procedure(s) (LRB): VIDEO BRONCHOSCOPY WITH ENDOBRONCHIAL ULTRASOUND (N/A) MEDIASTINOSCOPY (N/A)  Subjective: Feels so-so. + productive cough, stable dyspnea. Tmax 101.3 last night. Tolerating liquids, no nausea.    Objective: Vital signs in last 24 hours: Patient Vitals for the past 24 hrs:  BP Temp Temp src Pulse Resp SpO2 Weight  05/14/13 0549 136/72 mmHg 99.6 F (37.6 C) Oral 85 18 97 % -  05/14/13 0514 - - - - - - 223 lb 12.3 oz (101.5 kg)  05/13/13 2116 137/72 mmHg 101.3 F (38.5 C) Oral 84 24 97 % -  05/13/13 1350 128/69 mmHg 98.7 F (37.1 C) - 83 27 98 % -  05/13/13 1335 129/69 mmHg - - 82 30 99 % -  05/13/13 1320 130/72 mmHg - - 82 31 93 % -  05/13/13 1305 136/66 mmHg - - 81 35 96 % -  05/13/13 1250 134/77 mmHg - - 83 32 98 % -  05/13/13 1238 142/83 mmHg - - 82 35 96 % -  05/13/13 1233 - 98.7 F (37.1 C) - - - - -   Current Weight  05/14/13 223 lb 12.3 oz (101.5 kg)     Intake/Output from previous day: 05/30 0701 - 05/31 0700 In: 1900 [I.V.:1900] Out: 2150 [Urine:2150]    PHYSICAL EXAM:  Heart: RRR Lungs: Decreased BS bilaterally Wound: Dressed and dry    Lab Results: CBC: Recent Labs  05/12/13 2055  WBC 13.8*  HGB 9.2*  HCT 28.5*  PLT 340   BMET:  Recent Labs  05/12/13 2055  NA 137  K 3.8  CL 102  CO2 24  GLUCOSE 107*  BUN 8  CREATININE 0.73  CALCIUM 8.9    PT/INR:  Recent Labs  05/12/13 2055  LABPROT 16.0*  INR 1.31   Intraop cx's and path pending   Assessment/Plan: S/P Procedure(s) (LRB): VIDEO BRONCHOSCOPY WITH ENDOBRONCHIAL ULTRASOUND (N/A) MEDIASTINOSCOPY (N/A) Stable overall post op. Temp overnight, likely atelectasis.  She did not have labs or a CXR this am, so I will order those.  Continue IV Vanc.  ? If we need to switch to po abx vs d/c abx at discharge. She will probably need home O2, so  will arrange. Advance diet, continue pulm toilet, mobilize. Possibly home in am if no more fevers.   LOS: 8 days    COLLINS,GINA H 05/14/2013  I have seen and examined Laurier Nancy and agree with the above assessment  and plan.  Delight Ovens MD Beeper [phone] Office [phone] 05/14/2013 11:02 AM

## 2013-05-14 NOTE — Op Note (Signed)
NAMEEVAN, OSBURN                 ACCOUNT NO.:  [id number]  MEDICAL RECORD NO.:  [id number]  LOCATION:  2011                         FACILITY:  [facility]  PHYSICIAN:  Salvatore Decent. Dorris Fetch, M.D.DATE OF BIRTH:  09-14-1961  DATE OF PROCEDURE:  05/13/2013 DATE OF DISCHARGE:                              OPERATIVE REPORT   PREOPERATIVE DIAGNOSIS:  Bilateral pulmonary nodules with hilar and mediastinal adenopathy.  POSTOPERATIVE DIAGNOSIS:  Bilateral pulmonary nodules with hilar and mediastinal adenopathy.  PROCEDURE:  Bronchoscopy, endobronchial ultrasound, mediastinoscopy.  SURGEON:  Salvatore Decent. Dorris Fetch, MD  ASSISTANT:  None.  ANESTHESIA:  General.  FINDINGS:  Bronchoscopy, normal endobronchial anatomy.  No endobronchial lesion seen, diffuse airway edema.  Endobronchial ultrasound enlarged 4R and 7 nodes.  Aspiration accomplished without difficulty.  Quick prep showed only lymphoid cells.  Mediastinoscopy multiple large lymph nodes encountered, inflammatory reaction around the nodes themselves are somewhat pale, edematous, and necrotic.  Frozen section, nondiagnostic.  CLINICAL NOTE:  Ms. Morrissette is a 52 year old woman who presents with a chief complaint of shortness of breath.  Workup revealed bilateral pulmonary nodules, and widespread adenopathy suspicious for metastatic cancer.  She was advised to undergo bronchoscopy, endobronchial ultrasound, and possible mediastinoscopy for diagnostic purposes.  The indications, risks, benefits, and alternatives were discussed in detail with the patient.  She understood and accepted the risks and agreed to proceed.  OPERATIVE NOTE:  Mrs. Bridgewater was brought to the operating room on May 13, 2013, she was anesthetized and intubated.  Intravenous antibiotics were administered.  Flexible fiberoptic bronchoscopy was performed via the endotracheal tube.  There were no endobronchial lesions seen.  There was diffuse airway edema.  Bronchoalveolar  lavage was performed bilaterally with specimen sent for AFB and fungal cultures. The video bronchoscope was withdrawn.   An endobronchial ultrasound was passed.  There were markedly enlarged level 4R and 7 nodes.  Each of these nodes was aspirated.  With each aspiration, suction was applied and 12 passes were taken with the needle, 2 separate aspirations were performed for each node.  A portion of specimens were sent for quick prep and the remainder was sent for permanent cytology.  Quick prep returned with lymphoid cells, but no definitive diagnosis could be made.  Next, the chest and neck were prepped and draped in usual sterile fashion.  A transverse incision was made 1 fingerbreadth above the sternal notch.  It was carried through the skin and subcutaneous tissue. The strap muscles were identified and separated in the midline. Pretracheal fascia was identified and incised.  Pretracheal plane was developed bluntly into the mediastinum.  There were easily palpable 4R nodes.  The mediastinoscope was inserted and systematic inspection of the mediastinal lymph nodes was carried out.  There were multiple markedly enlarged and edematous nodes.  The tissues surrounding the nodes had a fibrotic response.  The nodes themselves were edematous, yellowish in color, and enlarged. 3 separate level 4 nodes were biopsied, a Level 7 node was biopsied and a small 4L node was biopsied.  A portion of 2 of the nodes were sent for AFB and fungal cultures.  One of the nodes was sent for frozen section, which was nondiagnostic.  The remainder of the tissue was submitted for permanent pathology.  After the biopsies were performed, hemostasis was achieved.  The wound was packed with gauze for 5 minutes.  The packing was removed.  There was small amount of oozing from the subcarinal nodes.  Surgicel was applied in this area. The scope was then withdrawn.  The incision was closed with interrupted 3-0 Vicryl  subcutaneous closure followed by a running 4-0 Monocryl subcuticular skin closure.  Dermabond was applied to the wound.  The patient was extubated in the operating room and taken to the postanesthetic care unit in good condition.     Salvatore Decent Dorris Fetch, M.D.     SCH/MEDQ  D:  05/13/2013  T:  05/14/2013  Job:  [id number]

## 2013-05-14 NOTE — Progress Notes (Signed)
Patient states "I just don't feel well today." On 3L nasal cannula. O2 sat 100%. Breathing labored with exertion. Patient instructed on use of I.S.; return demonstration. Patient encouraged to use 10x's per hour. Call bell and husband near.Megan Mcknight

## 2013-05-15 LAB — BASIC METABOLIC PANEL
BUN: 7 mg/dL (ref 6–23)
CO2: 26 mEq/L (ref 19–32)
Calcium: 8.7 mg/dL (ref 8.4–10.5)
Chloride: 102 mEq/L (ref 96–112)
Creatinine, Ser: 0.71 mg/dL (ref 0.50–1.10)
GFR calc Af Amer: >90 mL/min (ref >90)
GFR calc non Af Amer: >90 mL/min (ref >90)
Glucose, Bld: 102 mg/dL — ABNORMAL HIGH (ref 70–99)
Potassium: 3.3 mEq/L — ABNORMAL LOW (ref 3.5–5.1)
Sodium: 138 mEq/L (ref 135–145)

## 2013-05-15 LAB — CBC
HCT: 27 % — ABNORMAL LOW (ref 36.0–46.0)
Hemoglobin: 8.5 g/dL — ABNORMAL LOW (ref 12.0–15.0)
MCH: 25.1 pg — ABNORMAL LOW (ref 26.0–34.0)
MCHC: 31.5 g/dL (ref 30.0–36.0)
MCV: 79.6 fL (ref 78.0–100.0)
Platelets: 349 10*3/uL (ref 150–400)
RBC: 3.39 MIL/uL — ABNORMAL LOW (ref 3.87–5.11)
RDW: 13.8 % (ref 11.5–15.5)
WBC: 10.3 10*3/uL (ref 4.0–10.5)

## 2013-05-15 LAB — URINALYSIS, ROUTINE W REFLEX MICROSCOPIC
Bilirubin Urine: NEGATIVE
Glucose, UA: NEGATIVE mg/dL
Ketones, ur: NEGATIVE mg/dL
Leukocytes, UA: NEGATIVE
Nitrite: NEGATIVE
Protein, ur: NEGATIVE mg/dL
Specific Gravity, Urine: 1.019 (ref 1.005–1.030)
Urobilinogen, UA: 1 mg/dL (ref 0.0–1.0)
pH: 6 (ref 5.0–8.0)

## 2013-05-15 LAB — CULTURE, RESPIRATORY W GRAM STAIN

## 2013-05-15 LAB — EXPECTORATED SPUTUM ASSESSMENT W GRAM STAIN, RFLX TO RESP C: Special Requests: NORMAL

## 2013-05-15 LAB — URINE MICROSCOPIC-ADD ON

## 2013-05-15 NOTE — Progress Notes (Addendum)
      [address]       [place] [address]             [phone]                    2 Days Post-Op Procedure(s) (LRB): VIDEO BRONCHOSCOPY WITH ENDOBRONCHIAL ULTRASOUND (N/A) MEDIASTINOSCOPY (N/A)  Subjective: Feels a little better today, but continues to spike fevers.  +productive cough with blood-tinged sputum.   Objective: Vital signs in last 24 hours: Patient Vitals for the past 24 hrs:  BP Temp Temp src Pulse Resp SpO2  05/15/13 0828 - 99.4 F (37.4 C) - - - -  05/15/13 0640 - 99.5 F (37.5 C) Oral - - -  05/15/13 0500 134/66 mmHg 101.2 F (38.4 C) Oral 78 18 98 %  05/15/13 0454 - 102.2 F (39 C) Oral - - -  05/14/13 2117 - 102.5 F (39.2 C) Oral - - -  05/14/13 2004 123/69 mmHg 99 F (37.2 C) Oral 80 20 100 %  05/14/13 1750 - 98.9 F (37.2 C) Oral - - -  05/14/13 1419 141/72 mmHg 102.5 F (39.2 C) Oral 86 20 100 %  05/14/13 1020 134/65 mmHg - - 84 - -   Current Weight  05/14/13 223 lb 12.3 oz (101.5 kg)     Intake/Output from previous day: 05/31 0701 - 06/01 0700 In: 240 [P.O.:240] Out: 600 [Urine:600]    PHYSICAL EXAM:  Heart: RRR Lungs: Diminished BS bilaterally, no rales or rhonchi Wound: Clean and dry    Lab Results: CBC: Recent Labs  05/12/13 2055 05/15/13 0435  WBC 13.8* 10.3  HGB 9.2* 8.5*  HCT 28.5* 27.0*  PLT 340 349   BMET:  Recent Labs  05/12/13 2055 05/15/13 0435  NA 137 138  K 3.8 3.3*  CL 102 102  CO2 24 26  GLUCOSE 107* 102*  BUN 8 7  CREATININE 0.73 0.71  CALCIUM 8.9 8.7    PT/INR:  Recent Labs  05/12/13 2055  LABPROT 16.0*  INR 1.31   CXR: Findings: Diffuse tiny pulmonary nodules bilaterally.  Patchy left lower lobe opacity atelectasis versus pneumonia.  No pleural effusion or pneumothorax.  Cardiomegaly.  IMPRESSION:  Diffuse tiny pulmonary nodules bilaterally.  Patchy left lower lobe opacity, atelectasis versus pneumonia.     Assessment/Plan: S/P Procedure(s) (LRB): VIDEO  BRONCHOSCOPY WITH ENDOBRONCHIAL ULTRASOUND (N/A) MEDIASTINOSCOPY (N/A)  Continues to spike fevers.  WBC actually trending down. She is now off antibiotics. Intraop cx's and path pending. Preop sputum cx negative.  Will reculture sputum, check u/a. ?if she needs to resume abx.  Will discuss with MD.  Continue pulm toilet, home O2 arranged.  Home once afebrile.     LOS: 9 days    Mcknight,Megan H 05/15/2013  Still spiking fevers episodically, no obvious source On o2  I have seen and examined Megan Mcknight and agree with the above assessment  and plan.  Delight Ovens MD Beeper [phone] Office [phone] 05/15/2013 10:07 AM

## 2013-05-15 NOTE — Progress Notes (Signed)
Ambulated 350 ft with patient in hallway on 2L. Denies any distress. Returned to the room w /out incidence. Encourage ambulation and use of I.S. Patient verbalized understanding. Call bell near. Mamie Levers

## 2013-05-15 NOTE — Progress Notes (Signed)
SATURATION QUALIFICATIONS: (This note is used to comply with regulatory documentation for home oxygen)  Patient Saturations on Room Air at Rest 84*%  Patient Saturations on Room Air while Ambulating =84-87 %  Patient Saturations on 0 Liters of oxygen while Ambulating = none  Please briefly explain why patient needs home oxygen: Patient very short of breath with exertion. O2 sat 84-87% while ambulating on room air. CXR shows lung nodules with unknown etiology.

## 2013-05-16 ENCOUNTER — Encounter (HOSPITAL_COMMUNITY): Payer: Self-pay | Admitting: Thoracic Surgery (Cardiothoracic Vascular Surgery)

## 2013-05-16 LAB — CBC
HCT: 25.9 % — ABNORMAL LOW (ref 36.0–46.0)
Hemoglobin: 8.3 g/dL — ABNORMAL LOW (ref 12.0–15.0)
MCH: 25.3 pg — ABNORMAL LOW (ref 26.0–34.0)
MCHC: 32 g/dL (ref 30.0–36.0)
MCV: 79 fL (ref 78.0–100.0)
Platelets: 348 10*3/uL (ref 150–400)
RBC: 3.28 MIL/uL — ABNORMAL LOW (ref 3.87–5.11)
RDW: 13.7 % (ref 11.5–15.5)
WBC: 11.7 10*3/uL — ABNORMAL HIGH (ref 4.0–10.5)

## 2013-05-16 LAB — TISSUE CULTURE
Culture: NO GROWTH
Culture: NO GROWTH
Gram Stain: NONE SEEN
Gram Stain: NONE SEEN
Special Requests: 7

## 2013-05-16 LAB — URINE CULTURE
Colony Count: 40000
Special Requests: NORMAL

## 2013-05-16 MED ORDER — LEVOFLOXACIN 500 MG PO TABS
500.0000 mg | ORAL_TABLET | Freq: Every day | ORAL | Status: DC
  Administered 2013-05-16: 500 mg via ORAL
  Filled 2013-05-16 (×2): qty 1

## 2013-05-16 NOTE — Progress Notes (Addendum)
[address]       [place] [address]             [phone]      3 Days Post-Op  Procedure(s) (LRB): VIDEO BRONCHOSCOPY WITH ENDOBRONCHIAL ULTRASOUND (N/A) MEDIASTINOSCOPY (N/A) Subjective: Feels ok, + cough, +sputum  Objective  Telemetry sinus rhythm  Temp:  [98.1 F (36.7 C)-102.3 F (39.1 C)] 101 F (38.3 C) (06/02 0525) Pulse Rate:  [72-84] 76 (06/02 0525) Resp:  [20] 20 (06/02 0525) BP: (100-145)/(51-62) 145/62 mmHg (06/02 0525) SpO2:  [98 %-100 %] 100 % (06/02 0525)   Intake/Output Summary (Last 24 hours) at 05/16/13 0735 Last data filed at 05/15/13 1700  Gross per 24 hour  Intake   1200 ml  Output      0 ml  Net   1200 ml       General appearance: alert, cooperative and no distress Heart: regular rate and rhythm Lungs: fairly clear throughout Abdomen: benign Extremities: minor edema Wound: ok  Lab Results:  Recent Labs  05/15/13 0435  NA 138  K 3.3*  CL 102  CO2 26  GLUCOSE 102*  BUN 7  CREATININE 0.71  CALCIUM 8.7   No results found for this basename: AST, ALT, ALKPHOS, BILITOT, PROT, ALBUMIN,  in the last 72 hours No results found for this basename: LIPASE, AMYLASE,  in the last 72 hours  Recent Labs  05/15/13 0435 05/16/13 0420  WBC 10.3 11.7*  HGB 8.5* 8.3*  HCT 27.0* 25.9*  MCV 79.6 79.0  PLT 349 348   No results found for this basename: CKTOTAL, CKMB, TROPONINI,  in the last 72 hours No components found with this basename: POCBNP,  No results found for this basename: DDIMER,  in the last 72 hours No results found for this basename: HGBA1C,  in the last 72 hours No results found for this basename: CHOL, HDL, LDLCALC, TRIG, CHOLHDL,  in the last 72 hours No results found for this basename: TSH, T4TOTAL, FREET3, T3FREE, THYROIDAB,  in the last 72 hours No results found for this basename: VITAMINB12, FOLATE, FERRITIN, TIBC, IRON, RETICCTPCT,  in the last 72 hours  Medications: Scheduled . atorvastatin   20 mg Oral q1800  . benzonatate  100 mg Oral TID  . enoxaparin  40 mg Subcutaneous Q24H  . metoprolol tartrate  25 mg Oral TID  . senna  1 tablet Oral QHS     Radiology/Studies:  Dg Chest Port 1 View  05/14/2013   *RADIOLOGY REPORT*  Clinical Data: Bilateral pulmonary nodules, status post bronchoscopy  PORTABLE CHEST - 1 VIEW  Comparison: 05/13/2013  Findings: Diffuse tiny pulmonary nodules bilaterally.  Patchy left lower lobe opacity atelectasis versus pneumonia.  No pleural effusion or pneumothorax.  Cardiomegaly.  IMPRESSION: Diffuse tiny pulmonary nodules bilaterally.  Patchy left lower lobe opacity, atelectasis versus pneumonia.   Original Report Authenticated By: Charline Bills, M.D.    INR: Will add last result for INR, ABG once components are confirmed Will add last 4 CBG results once components are confirmed  Assessment/Plan: S/P Procedure(s) (LRB): VIDEO BRONCHOSCOPY WITH ENDOBRONCHIAL ULTRASOUND (N/A) MEDIASTINOSCOPY (N/A)  1 conts with fevers, ? Resume abx. ,cultures and path still pending    LOS: 10 days    GOLD,WAYNE E 6/2/20147:35 AM  Still having some fevers- no obvious infectious source D/w pathology- it looks like she has lymphoma but the final diagnosis is pending Will start empiric antibiotics and go ahead and have Oncology see  her while she is here

## 2013-05-17 DIAGNOSIS — K750 Abscess of liver: Secondary | ICD-10-CM

## 2013-05-17 DIAGNOSIS — R918 Other nonspecific abnormal finding of lung field: Secondary | ICD-10-CM

## 2013-05-17 DIAGNOSIS — C8589 Other specified types of non-Hodgkin lymphoma, extranodal and solid organ sites: Secondary | ICD-10-CM

## 2013-05-17 LAB — CULTURE, RESPIRATORY W GRAM STAIN
Culture: NORMAL
Gram Stain: NONE SEEN

## 2013-05-17 MED ORDER — PANTOPRAZOLE SODIUM 40 MG PO TBEC
40.0000 mg | DELAYED_RELEASE_TABLET | Freq: Every day | ORAL | Status: DC
  Administered 2013-05-17 – 2013-05-21 (×5): 40 mg via ORAL
  Filled 2013-05-17 (×6): qty 1

## 2013-05-17 MED ORDER — ALUM & MAG HYDROXIDE-SIMETH 200-200-20 MG/5ML PO SUSP
30.0000 mL | ORAL | Status: DC | PRN
  Administered 2013-05-17: 30 mL via ORAL
  Filled 2013-05-17: qty 30

## 2013-05-17 MED ORDER — LEVOFLOXACIN 750 MG PO TABS
750.0000 mg | ORAL_TABLET | Freq: Every day | ORAL | Status: DC
  Administered 2013-05-17 – 2013-05-21 (×5): 750 mg via ORAL
  Filled 2013-05-17 (×5): qty 1

## 2013-05-17 NOTE — Consult Note (Signed)
[facility]  [phone]) [phone]    ONCOLOGY  HOSPITAL CONSULTATION NOTE  Megan Mcknight                                MR#: [id number]  DOB: [birth year]                       CSN#: [id number]  Referring MD: Dr. Emmie Mcknight Hospitalists Primary MD: Dr.[name]  Reason for Consult: Lymphoma   NWG:NFAOZH L Mcknight is a 52 y.o. female admitted on 05/06/2013 with AFib (soon converting to SR) and Pneumonia, following an episode of bronchitis a week prior, treated with Z pack without resolution.  CT of the chest with contrast on 5/27 was performed after a CXR on admission had shown diffuse reticulonodular opacities bilaterally, possibly reflecting mild to moderate interstitial edema or atypical infection. This CT revealed diffuse pulmonary nodules ,enlarged bilateral mediastinal lymph nodes, and multi focal low attenuation lesions within the liver are concerning for metastatic disease (please see CT report below for details). She underwent bronchoscopy with endobronchial ultrasound and mediastinoscopy on 5/30 by Dr. Dorris Fetch. FNA showed small lymphs. Of note, she had noted to have leukocytosis, with WBC on 5/23 of 19.3.Rest of the bronchoscopy  report is pending.No CT of the abdomen and pelvis at the time of consultation.No tumor markers available for review. We were requested to see this patient with recommendations.        PMH:  Past Medical History  Diagnosis Date  . Hyperlipidemia   . Nerve pain     Left leg after crushing injury 3 /3/12  . Seasonal allergies   . Acute bronchitis   . Colon polyps 02/2013    Per colonoscopy  . Diverticulosis 02/2013    Per colonoscopy  . Former light tobacco smoker   deep contusion, LEFT foot.                                                                                  02/15/11   Surgeries:  Past Surgical History  Procedure Laterality Date  . Partial hysterectomy    . Bones removed from baby toes    . Colonoscopy  02/16/2012   Procedure: COLONOSCOPY;  Surgeon: Megan Ade, MD;  [facility]: [facility];  Service: Endoscopy;  Laterality: N/A;  1:30  . Abdominal hysterectomy    . Video bronchoscopy with endobronchial ultrasound N/A 05/13/2013    Procedure: VIDEO BRONCHOSCOPY WITH ENDOBRONCHIAL ULTRASOUND;  Surgeon: Megan Slot, MD;  Location: [facility] OR;  Service: Thoracic;  Laterality: N/A;  . Mediastinoscopy N/A 05/13/2013    Procedure: MEDIASTINOSCOPY;  Surgeon: Megan Slot, MD;  Location: [facility] OR;  Service: Thoracic;  Laterality: N/A;    Allergies:  Allergies  Allergen Reactions  . Iodine Anaphylaxis    Told to avoid due to shellfish allergy.  Marland Kitchen Shellfish-Derived Products Anaphylaxis and Swelling    Eyes swelling, scratchy throat, bumps on lips.    Medications:   Prior to Admission:  Prescriptions prior to admission  Medication Sig Dispense Refill  . albuterol (PROVENTIL HFA;VENTOLIN HFA) 108 (90  BASE) MCG/ACT inhaler Inhale 2 puffs into the lungs every 6 (six) hours as needed for wheezing.      Marland Kitchen ibuprofen (ADVIL,MOTRIN) 200 MG tablet Take 400 mg by mouth every 6 (six) hours as needed for pain.      . montelukast (SINGULAIR) 10 MG tablet Take 10 mg by mouth daily as needed (Allergies).       . rosuvastatin (CRESTOR) 10 MG tablet Take 10 mg by mouth daily.        ZOX:[id number], acetaminophen, albuterol, alum & mag hydroxide-simeth, guaiFENesin-dextromethorphan, ibuprofen, levalbuterol, montelukast, ondansetron (ZOFRAN) IV, ondansetron, oxyCODONE, sodium chloride  ROS: Constitutional:Negative for weight loss. Positive for intermittent subjective for fever but no chills or  night sweats. Generalized malaise on admission Eyes: Negative for blurred vision and double vision.  Respiratory: Negative for  Non productive cough. No hemoptysis.No hoarseness.No neck swelling.  Positive for shortness of breath on exertion. No pleuritic chest pain.  Cardiovascular: Negative for chest pain.   Palpitations on admission, now resolved.  GI: Negative for  nausea, vomiting, diarrhea or constipation. No change in bowel caliber. No  Melena or hematochezia. Positive for  abdominal pain.  GU: Negative for hematuria. No loss of urinary control. No urinary retention.No changes in urine color.  Skin: Negative for itching. No rash. No petechia. No easy  Bruising. Musculoskeletal: Denies back   No arm pain.  Neurological: No headaches.No confusion. No motor or sensory deficits.  Family History:    Family History  Problem Relation Age of Onset  . Heart defect      family history   . Asthma      family history   . Colon polyps Mother    Prostate Cancer                                   Father  Uncle died with leukemia She denies any other family history of oncological disorders.   Social History:  Lives in [place], [place].  She is divorced. She is employed as Administrator, sports. She has 3 children in good health. She stopped smoking 4 weeks ago 1/2 ppd for 20 plus years. She drinks alcohol only on occasion. She denies illicit drug use.    Physical Exam    Filed Vitals:   05/17/13 0427  BP: 128/73  Pulse: 79  Temp: 99.7 F (37.6 C)  Resp: 18     Filed Weights   05/12/13 0548 05/14/13 0514 05/17/13 0427  Weight: 223 lb 5.2 oz (101.3 kg) 223 lb 12.3 oz (101.5 kg) 222 lb 10.6 oz (101 kg)   General:  89 -year-old  in no acute distress A. and O. x3  well-developed, well-nourished.tearful. She reports being scared  HEENT: Normocephalic, atraumatic, PERRLA. Sclerae anicteric. Oral cavity without thrush or lesions. NECK:supple. no thyromegaly, no cervical or supraclavicular adenopathy  LUNGS: clear bilaterally . No wheezing, rhonchi or rales. No axillary masses. BREASTS: not examined. CARDIOVASCULAR: regular rate and rhythm,no murmur , rubs or gallops ABDOMEN: soft, obese, nontender, bowel sounds x4. No HSM. No masses palpable.  GU/rectal: deferred. EXTREMITIES: no clubbing cyanosis or edema. No  bruising or petechial rash MUSCULOSKELETAL: no spinal tenderness.  NEURO: Non Focal. No Horner's.   Labs:  CBC   Recent Labs Lab 05/12/13 2055 05/15/13 0435 05/16/13 0420  WBC 13.8* 10.3 11.7*  HGB 9.2* 8.5* 8.3*  HCT 28.5* 27.0* 25.9*  PLT 340 349 348  MCV 79.4 79.6 79.0  MCH 25.6* 25.1* 25.3*  MCHC 32.3 31.5 32.0  RDW 13.6 13.8 13.7     CMP    Recent Labs Lab 05/11/13 0527 05/12/13 2055 05/15/13 0435  NA 141 137 138  K 3.7 3.8 3.3*  CL 106 102 102  CO2 26 24 26   GLUCOSE 95 107* 102*  BUN 6 8 7   CREATININE 0.68 0.73 0.71  CALCIUM 8.9 8.9 8.7  AST  --  24  --   ALT  --  33  --   ALKPHOS  --  125*  --   BILITOT  --  0.2*  --         Component Value Date/Time   BILITOT 0.2* 05/12/2013 2055      Recent Labs Lab 05/12/13 2055  INR 1.31    No results found for this basename: DDIMER,  in the last 72 hours   Anemia panel:  No results found for this basename: VITAMINB12, FOLATE, FERRITIN, TIBC, IRON, RETICCTPCT,  in the last 72 hours   Imaging Studies:  Ct Chest Wo Contrast  05/10/2013   *RADIOLOGY REPORT*  Clinical Data: Abnormal chest radiograph  CT CHEST WITHOUT CONTRAST  Technique:  Multidetector CT imaging of the chest was performed following the standard protocol without IV contrast.  Comparison: None  Findings: Lungs/pleura: Small pleural effusions are identified.   Innumerable pulmonary nodules are identified throughout both lungs.  Some of these nodules are coalescent and others are clustered.  Index nodule within the right upper lobe measures 1 cm, image number 19/series 3.  Index nodule within the left upper lobe measures 1 cm, image 26/series 3.  Right middle lobe nodule measures 1.2 cm. Airspace consolidation and atelectasis is noted within the posterior lung bases posteriorly.  Heart/Mediastinum: Multiple enlarged mediastinal lymph nodes are identified bilaterally.  Index prevascular lymph node measures 1.5 cm, image 20/series 2.  Index  subcarinal lymph node measures 2.1 cm, image 25/series 2. At the thoracic inlet there is a right sided lymph node measuring 1 cm.  The heart is mildly enlarged.  There is no pericardial effusion.  Upper abdomen: Incidental imaging through the upper abdomen shows multi focal areas of low attenuation within the liver parenchyma. Index lesion along the anterior right hepatic lobe measures 3.2 cm, image 45/series 2.  Left hepatic lobe lesion measures 3.6 cm, image 49/series 2.  The adrenal glands are both of the adrenal glands are grossly unremarkable.  Bones/Musculoskeletal:  No aggressive lytic or sclerotic bone lesions identified.  IMPRESSION:  1.  Diffuse pulmonary nodules are worrisome for metastatic disease. 2. Enlarged bilateral mediastinal lymph nodes are concerning for metastatic adenopathy. 3.  Multi focal low attenuation lesions within the liver are concerning for metastatic disease.   Original Report Authenticated By: Signa Kell, M.D.   Dg Chest Port 1 View  05/14/2013   *RADIOLOGY REPORT*  Clinical Data: Bilateral pulmonary nodules, status post bronchoscopy  PORTABLE CHEST - 1 VIEW  Comparison: 05/13/2013  Findings: Diffuse tiny pulmonary nodules bilaterally.  Patchy left lower lobe opacity atelectasis versus pneumonia.  No pleural effusion or pneumothorax.  Cardiomegaly.  IMPRESSION: Diffuse tiny pulmonary nodules bilaterally.  Patchy left lower lobe opacity, atelectasis versus pneumonia.   Original Report Authenticated By: Charline Bills, M.D.   Dg Chest Port 1 View  05/13/2013   *RADIOLOGY REPORT*  Clinical Data: Status post bronchoscopy, shortness of breath  PORTABLE CHEST - 1 VIEW  Comparison: 05/10/2013  Findings: Diffuse bilateral innumerable pulmonary nodules/nodular opacities.  Mild cardiac enlargement.  Trachea remains midline.  No pneumothorax following bronchoscopy.  IMPRESSION: Extensive diffuse pulmonary nodules/nodular opacities.  No pneumothorax   Original Report Authenticated By:  Judie Petit. Miles Costain, M.D.   Dg Chest Port 1 View  05/09/2013   *RADIOLOGY REPORT*  Clinical Data: Follow-up edema/pneumonia pattern.  PORTABLE CHEST - 1 VIEW  Comparison: 05/06/2013 and 03/09/2013  Findings: Artifact overlies chest.  Heart size remains at the upper limits of normal.  Mediastinal shadows are normal.  Widespread bilateral pulmonary densities with a nodular pattern remain evident.  More pronounced density in both lower lobes is present with some volume loss.  The lungs are clear and late March.  The pattern is most worrisome for atypical infection.  This would be an unusual manifestation of edema.  Worsening density in the lower lobes could be due to worsening alveolar consolidation or volume loss.  IMPRESSION: Diffuse micronodular pattern.  Worsening density at the lung bases. Primary concern is that of atypical infection with worsening volume loss at the lung bases.  The pattern would be unusual for edema.   Original Report Authenticated By: Paulina Fusi, M.D.   Dg Chest Port 1 View  05/06/2013   *RADIOLOGY REPORT*  Clinical Data: Shortness of breath, acute bronchitis  PORTABLE CHEST - 1 VIEW  Comparison: 03/09/2013  Findings: Diffuse reticulonodular opacities bilaterally, possibly reflecting mild to moderate interstitial edema or atypical infection. No pleural effusion or pneumothorax.  Mild cardiomegaly.  IMPRESSION: Diffuse reticulonodular opacities bilaterally, possibly reflecting mild to moderate interstitial edema or atypical infection.   Original Report Authenticated By: Charline Bills, M.D.      A/P: 52 y.o. female  Asked to see for evaluation of diffuse pulmonary nodules ,enlarged bilateral mediastinal lymph nodes, and multi focal low attenuation lesions within the liver are concerning for metastatic disease as seen on a CT of the Chest on 5/27 as the present was admitted for management  of pneumonia. Patient underwent B+M with EBUS, which formal report is currently pending. Of note, FNA  demonstrated small lymphs. If bronchoscopy results are inconclusive, she may need biopsy of the liver mass for tissue diagnosis in the setting of known colon polyp history.  Dr.  [name]   is to see the patient following this consult with recommendations regarding diagnosis, treatment options and further workup studies. Addendum to this note to be written. Thank you for the referral.  Marcos Eke, PA-C 05/17/2013 11:04 AM  Patient was seen and examined this evening. I have reviewed the case history as recorded above and reviewed the chest CT scan from 05/10/2013. Case discussed with Dr. Dierdre Searles, pathologist, who states that the lymph node biopsies from the mediastinoscopy carried out on 05/13/2013 show a large cell lymphoma. Special studies have been initiated for subtyping.The official report is still pending.  The patient had been fairly healthy up until just a few weeks ago. Her CT scans are striking and highly atypical for lymphoma which unfortunately suggests that this is a high-grade lymphoma. Patient's fever could certainly be on a neoplastic basis. She has been informed about our impressions and the CT scan findings. Once the staging and pathologic workup has been completed, the patient will require chemotherapy, probably with the Rituxan-CHOP regimen. I am concerned that patient may require treatment as soon as possible and that treatment may need to be initiated in the hospital. The patient will need to decide where she wants to receive her care following discharge. She lives in [place] and it may be more convenient for her to receive subsequent  outpatient chemotherapy in [place] at the [facility] oncology facility.  I have ordered a bone marrow aspirate and biopsy and additional studies to be carried out by interventional radiology and a PET scan to obtain further staging information. Additional labs have been ordered to rule out exposure to HIV and hepatitis B. Of note is the hemoglobin  from 07/16/2013 at 8.3, down from 14.1 on 03/09/2013. The patient may require red cell transfusion.   Samul Dada 05/17/2013 8:45 PM

## 2013-05-17 NOTE — Progress Notes (Addendum)
                    [address]            [place] [address]          [phone]     4 Days Post-Op Procedure(s) (LRB): VIDEO BRONCHOSCOPY WITH ENDOBRONCHIAL ULTRASOUND (N/A) MEDIASTINOSCOPY (N/A)  Subjective: C/o "acid refux" this am.  Breathing stable, very little cough at this point.  Still gets SOB with exertion.   Objective: Vital signs in last 24 hours: Patient Vitals for the past 24 hrs:  BP Temp Temp src Pulse Resp SpO2 Weight  05/17/13 0427 128/73 mmHg 99.7 F (37.6 C) Oral 79 18 100 % 222 lb 10.6 oz (101 kg)  05/16/13 1941 126/68 mmHg 99.4 F (37.4 C) Oral 81 20 99 % -  05/16/13 1700 - 102.3 F (39.1 C) - - - - -  05/16/13 1417 107/63 mmHg 98.1 F (36.7 C) Oral 74 20 100 % -   Current Weight  05/17/13 222 lb 10.6 oz (101 kg)     Intake/Output from previous day: 06/02 0701 - 06/03 0700 In: 840 [P.O.:840] Out: -     PHYSICAL EXAM:  Heart: RRR Lungs: Decreased BS in bases Wound: Clean and dry     Lab Results: CBC: Recent Labs  05/15/13 0435 05/16/13 0420  WBC 10.3 11.7*  HGB 8.5* 8.3*  HCT 27.0* 25.9*  PLT 349 348   BMET:  Recent Labs  05/15/13 0435  NA 138  K 3.3*  CL 102  CO2 26  GLUCOSE 102*  BUN 7  CREATININE 0.71  CALCIUM 8.7    PT/INR: No results found for this basename: LABPROT, INR,  in the last 72 hours  Sputum Cx negative so far Urine Cx 40K colonies yeast  Assessment/Plan: S/P Procedure(s) (LRB): VIDEO BRONCHOSCOPY WITH ENDOBRONCHIAL ULTRASOUND (N/A) MEDIASTINOSCOPY (N/A)  1 isolated temp spike overnight, otherwise has remained afebrile. Labs ok. Continue empiric abx.  Final path pending, but probable lymphoma per pathology. Oncology to see pt.  Pulm stable, but still desats. Home O2 arranged.   LOS: 11 days    COLLINS,GINA H 05/17/2013  Patient seen and examined. Agree with above She doesn't feel as well this AM Will ask Oncology to see Possibly home tomorrow if fevers down and  breathing stable

## 2013-05-18 ENCOUNTER — Inpatient Hospital Stay (HOSPITAL_COMMUNITY): Payer: PRIVATE HEALTH INSURANCE

## 2013-05-18 ENCOUNTER — Encounter (HOSPITAL_COMMUNITY): Payer: Self-pay | Admitting: Radiology

## 2013-05-18 LAB — COMPREHENSIVE METABOLIC PANEL
ALT: 47 U/L — ABNORMAL HIGH (ref 0–35)
AST: 34 U/L (ref 0–37)
Albumin: 2.5 g/dL — ABNORMAL LOW (ref 3.5–5.2)
Alkaline Phosphatase: 129 U/L — ABNORMAL HIGH (ref 39–117)
BUN: 8 mg/dL (ref 6–23)
CO2: 28 mEq/L (ref 19–32)
Calcium: 9.5 mg/dL (ref 8.4–10.5)
Chloride: 100 mEq/L (ref 96–112)
Creatinine, Ser: 0.76 mg/dL (ref 0.50–1.10)
GFR calc Af Amer: >90 mL/min (ref >90)
GFR calc non Af Amer: >90 mL/min (ref >90)
Glucose, Bld: 90 mg/dL (ref 70–99)
Potassium: 3.3 mEq/L — ABNORMAL LOW (ref 3.5–5.1)
Sodium: 140 mEq/L (ref 135–145)
Total Bilirubin: 0.3 mg/dL (ref 0.3–1.2)
Total Protein: 7.6 g/dL (ref 6.0–8.3)

## 2013-05-18 LAB — CBC
HCT: 30.8 % — ABNORMAL LOW (ref 36.0–46.0)
Hemoglobin: 9.9 g/dL — ABNORMAL LOW (ref 12.0–15.0)
MCH: 25.6 pg — ABNORMAL LOW (ref 26.0–34.0)
MCHC: 32.1 g/dL (ref 30.0–36.0)
MCV: 79.8 fL (ref 78.0–100.0)
Platelets: 524 10*3/uL — ABNORMAL HIGH (ref 150–400)
RBC: 3.86 MIL/uL — ABNORMAL LOW (ref 3.87–5.11)
RDW: 14 % (ref 11.5–15.5)
WBC: 17.6 10*3/uL — ABNORMAL HIGH (ref 4.0–10.5)

## 2013-05-18 LAB — HIV ANTIBODY (ROUTINE TESTING W REFLEX): HIV: NONREACTIVE

## 2013-05-18 LAB — IRON AND TIBC
Iron: 10 ug/dL — ABNORMAL LOW (ref 42–135)
Saturation Ratios: 7 % — ABNORMAL LOW (ref 20–55)
TIBC: 139 ug/dL — ABNORMAL LOW (ref 250–470)
UIBC: 129 ug/dL (ref 125–400)

## 2013-05-18 LAB — LACTATE DEHYDROGENASE: LDH: 241 U/L (ref 94–250)

## 2013-05-18 LAB — HEPATITIS B CORE ANTIBODY, TOTAL: Hep B Core Total Ab: NEGATIVE

## 2013-05-18 LAB — FERRITIN: Ferritin: 1502 ng/mL — ABNORMAL HIGH (ref 10–291)

## 2013-05-18 LAB — HEPATITIS B CORE ANTIBODY, IGM: Hep B C IgM: NEGATIVE

## 2013-05-18 LAB — HEPATITIS B SURFACE ANTIGEN: Hepatitis B Surface Ag: NEGATIVE

## 2013-05-18 LAB — URIC ACID: Uric Acid, Serum: 3.5 mg/dL (ref 2.4–7.0)

## 2013-05-18 LAB — BONE MARROW EXAM: Bone Marrow Exam: 361

## 2013-05-18 MED ORDER — MIDAZOLAM HCL 2 MG/2ML IJ SOLN
INTRAMUSCULAR | Status: AC | PRN
  Administered 2013-05-18 (×3): 1 mg via INTRAVENOUS

## 2013-05-18 MED ORDER — ENOXAPARIN SODIUM 40 MG/0.4ML ~~LOC~~ SOLN
40.0000 mg | SUBCUTANEOUS | Status: DC
  Administered 2013-05-19 – 2013-05-22 (×4): 40 mg via SUBCUTANEOUS
  Filled 2013-05-18 (×5): qty 0.4

## 2013-05-18 MED ORDER — FENTANYL CITRATE 0.05 MG/ML IJ SOLN
INTRAMUSCULAR | Status: AC | PRN
  Administered 2013-05-18 (×3): 50 ug via INTRAVENOUS

## 2013-05-18 MED ORDER — HYDROCODONE-ACETAMINOPHEN 5-325 MG PO TABS
1.0000 | ORAL_TABLET | ORAL | Status: DC | PRN
  Filled 2013-05-18: qty 2

## 2013-05-18 NOTE — Progress Notes (Addendum)
5 Days Post-Op Procedure(s) (LRB): VIDEO BRONCHOSCOPY WITH ENDOBRONCHIAL ULTRASOUND (N/A) MEDIASTINOSCOPY (N/A) Subjective:  Ms. Torrens states she is feeling better this morning.  She remains febrile, but temp is down to 100.5.  Oncology feels that this may be Neoplastic in origin.  Objective: Vital signs in last 24 hours: Temp:  [98.6 F (37 C)-100.5 F (38.1 C)] 100.5 F (38.1 C) (06/04 0415) Pulse Rate:  [74-101] 101 (06/04 0415) Cardiac Rhythm:  [-] Normal sinus rhythm (06/04 0415) Resp:  [18] 18 (06/04 0415) BP: (107-156)/(68-95) 156/95 mmHg (06/04 0415) SpO2:  [97 %-100 %] 97 % (06/04 0415) Weight:  [220 lb 14.4 oz (100.2 kg)] 220 lb 14.4 oz (100.2 kg) (06/04 0415)  Intake/Output from previous day: 06/03 0701 - 06/04 0700 In: 480 [P.O.:480] Out: 1 [Stool:1]  General appearance: alert, cooperative and no distress Heart: regular rate and rhythm Lungs: diminished breath sounds bibasilar Abdomen: soft, non-tender; bowel sounds normal; no masses,  no organomegaly Wound: clean and dry  Lab Results:  Recent Labs  05/16/13 0420 05/18/13 0432  WBC 11.7* 17.6*  HGB 8.3* 9.9*  HCT 25.9* 30.8*  PLT 348 524*   BMET:  Recent Labs  05/18/13 0432  NA 140  K 3.3*  CL 100  CO2 28  GLUCOSE 90  BUN 8  CREATININE 0.76  CALCIUM 9.5    PT/INR: No results found for this basename: LABPROT, INR,  in the last 72 hours ABG No results found for this basename: phart, pco2, po2, hco3, tco2, acidbasedef, o2sat   CBG (last 3)  No results found for this basename: GLUCAP,  in the last 72 hours  Assessment/Plan: S/P Procedure(s) (LRB): VIDEO BRONCHOSCOPY WITH ENDOBRONCHIAL ULTRASOUND (N/A) MEDIASTINOSCOPY (N/A)  1. Febrile- patients temp down to 100.5 this morning- Oncology states this may be Neoplastic in origin 2. Final pathology pending- likely Lymphoma, Oncology consulted and recs Bone Marrow Biopsy and PET CT 3. Anemia- Hgb stable at 9.9 4. Hypoxia- mild home oxygen  arranged 5. Dispo- patient is stable, remains febrile this morning, Bone Marrow Biopsy is ordered, will plan to d/c once completed   LOS: 12 days    BARRETT, ERIN 05/18/2013  Patient seen and examined. Agree with above. Feels better today Appreciate Dr. Mamie Levers help For bone marrow biopsy today

## 2013-05-18 NOTE — Procedures (Signed)
CT bone marrow bx L iliac No complication No blood loss. See complete dictation in [facility].

## 2013-05-18 NOTE — Progress Notes (Signed)
Patient is 87% on RA

## 2013-05-18 NOTE — H&P (Signed)
Megan Mcknight is an 52 y.o. female.   Chief Complaint: Hx afib; came to ER for cough; PNA Work up revealed B pulmonary nodules; Mediastinal lymphadenopathy; liver lesions Bronchoscopy 5/30: small lymphocytes Bx mediastinal LAN: large cell lymphoma Now scheduled for Bone Marrow biopsy per Dr [name] HPI: HLD; smoker  Past Medical History  Diagnosis Date  . Hyperlipidemia   . Nerve pain     Left leg  . Seasonal allergies   . Acute bronchitis   . Colon polyps 02/2013    Per colonoscopy  . Diverticulosis 02/2013    Per colonoscopy  . Former light tobacco smoker     Past Surgical History  Procedure Laterality Date  . Partial hysterectomy    . Bones removed from baby toes    . Colonoscopy  02/16/2012    Procedure: COLONOSCOPY;  Surgeon: Corbin Ade, MD;  Location: [facility];  Service: Endoscopy;  Laterality: N/A;  1:30  . Abdominal hysterectomy    . Video bronchoscopy with endobronchial ultrasound N/A 05/13/2013    Procedure: VIDEO BRONCHOSCOPY WITH ENDOBRONCHIAL ULTRASOUND;  Surgeon: Loreli Slot, MD;  Location: [facility] OR;  Service: Thoracic;  Laterality: N/A;  . Mediastinoscopy N/A 05/13/2013    Procedure: MEDIASTINOSCOPY;  Surgeon: Loreli Slot, MD;  Location: [facility] OR;  Service: Thoracic;  Laterality: N/A;    Family History  Problem Relation Age of Onset  . Heart defect      family history   . Asthma      family history   . Colon polyps Mother    Social History:  reports that she has been smoking Cigarettes.  She has been smoking about 0.50 packs per day. She does not have any smokeless tobacco history on file. She reports that  drinks alcohol. She reports that she does not use illicit drugs.  Allergies:  Allergies  Allergen Reactions  . Iodine Anaphylaxis    Told to avoid due to shellfish allergy.  Marland Kitchen Shellfish-Derived Products Anaphylaxis and Swelling    Eyes swelling, scratchy throat, bumps on lips.    Medications Prior to Admission  Medication Sig  Dispense Refill  . albuterol (PROVENTIL HFA;VENTOLIN HFA) 108 (90 BASE) MCG/ACT inhaler Inhale 2 puffs into the lungs every 6 (six) hours as needed for wheezing.      Marland Kitchen ibuprofen (ADVIL,MOTRIN) 200 MG tablet Take 400 mg by mouth every 6 (six) hours as needed for pain.      . montelukast (SINGULAIR) 10 MG tablet Take 10 mg by mouth daily as needed (Allergies).       . rosuvastatin (CRESTOR) 10 MG tablet Take 10 mg by mouth daily.        Results for orders placed during the hospital encounter of 05/06/13 (from the past 48 hour(s))  CBC     Status: Abnormal   Collection Time    05/18/13  4:32 AM      Result Value Range   WBC 17.6 (*) 4.0 - 10.5 K/uL   RBC 3.86 (*) 3.87 - 5.11 MIL/uL   Hemoglobin 9.9 (*) 12.0 - 15.0 g/dL   HCT 62.1 (*) 30.8 - 65.7 %   MCV 79.8  78.0 - 100.0 fL   MCH 25.6 (*) 26.0 - 34.0 pg   MCHC 32.1  30.0 - 36.0 g/dL   RDW 84.6  96.2 - 95.2 %   Platelets 524 (*) 150 - 400 K/uL  COMPREHENSIVE METABOLIC PANEL     Status: Abnormal   Collection Time  05/18/13  4:32 AM      Result Value Range   Sodium 140  135 - 145 mEq/L   Potassium 3.3 (*) 3.5 - 5.1 mEq/L   Chloride 100  96 - 112 mEq/L   CO2 28  19 - 32 mEq/L   Glucose, Bld 90  70 - 99 mg/dL   BUN 8  6 - 23 mg/dL   Creatinine, Ser 4.69  0.50 - 1.10 mg/dL   Calcium 9.5  8.4 - 62.9 mg/dL   Total Protein 7.6  6.0 - 8.3 g/dL   Albumin 2.5 (*) 3.5 - 5.2 g/dL   AST 34  0 - 37 U/L   ALT 47 (*) 0 - 35 U/L   Alkaline Phosphatase 129 (*) 39 - 117 U/L   Total Bilirubin 0.3  0.3 - 1.2 mg/dL   GFR calc non Af Amer >90  >90 mL/min   GFR calc Af Amer >90  >90 mL/min   Comment:            The eGFR has been calculated     using the CKD EPI equation.     This calculation has not been     validated in all clinical     situations.     eGFR's persistently     <90 mL/min signify     possible Chronic Kidney Disease.  LACTATE DEHYDROGENASE     Status: None   Collection Time    05/18/13  4:32 AM      Result Value Range    LDH 241  94 - 250 U/L  URIC ACID     Status: None   Collection Time    05/18/13  4:32 AM      Result Value Range   Uric Acid, Serum 3.5  2.4 - 7.0 mg/dL   No results found.  Review of Systems  Constitutional: Negative for fever.    Blood pressure 156/95, pulse 101, temperature 100.5 F (38.1 C), temperature source Oral, resp. rate 18, height 5\' 8"  (1.727 m), weight 220 lb 14.4 oz (100.2 kg), SpO2 97.00%. Physical Exam  Constitutional: She is oriented to person, place, and time. She appears well-developed and well-nourished.  Cardiovascular:  No murmur heard. Irreg  Respiratory: Effort normal and breath sounds normal. She has no wheezes.  GI: Soft. Bowel sounds are normal. There is no tenderness.  Musculoskeletal: Normal range of motion.  Neurological: She is alert and oriented to person, place, and time.  Psychiatric: She has a normal mood and affect. Her behavior is normal. Judgment and thought content normal.     Assessment/Plan Large cell lymphoma - mediastinal LAN B pulm nodules; liver lesions PET for staging asap Scheduled now for BM bx Pt aware of procedure benefits and risks and agreeable to proceed Consent signed and in chart Held Lovenox today  Thai Hemrick A 05/18/2013, 8:26 AM

## 2013-05-19 DIAGNOSIS — K750 Abscess of liver: Secondary | ICD-10-CM

## 2013-05-19 DIAGNOSIS — C8590 Non-Hodgkin lymphoma, unspecified, unspecified site: Secondary | ICD-10-CM | POA: Diagnosis present

## 2013-05-19 DIAGNOSIS — R918 Other nonspecific abnormal finding of lung field: Secondary | ICD-10-CM

## 2013-05-19 DIAGNOSIS — C8589 Other specified types of non-Hodgkin lymphoma, extranodal and solid organ sites: Secondary | ICD-10-CM

## 2013-05-19 LAB — HEPATITIS B SURFACE ANTIBODY,QUALITATIVE: Hep B S Ab: REACTIVE — AB

## 2013-05-19 MED ORDER — POTASSIUM CHLORIDE CRYS ER 20 MEQ PO TBCR
20.0000 meq | EXTENDED_RELEASE_TABLET | Freq: Once | ORAL | Status: AC
  Administered 2013-05-19: 20 meq via ORAL
  Filled 2013-05-19: qty 1

## 2013-05-19 MED ORDER — ALLOPURINOL 300 MG PO TABS
300.0000 mg | ORAL_TABLET | Freq: Every day | ORAL | Status: DC
  Administered 2013-05-20 – 2013-05-24 (×5): 300 mg via ORAL
  Filled 2013-05-19 (×5): qty 1

## 2013-05-19 NOTE — Progress Notes (Addendum)
6 Days Post-Op Procedure(s) (LRB): VIDEO BRONCHOSCOPY WITH ENDOBRONCHIAL ULTRASOUND (N/A) MEDIASTINOSCOPY (N/A) Subjective:  Ms. Kimm is without complaints this morning.  She remains mildly febrile.  Bone Marrow Biopsy was completed yesterday.   Objective: Vital signs in last 24 hours: Temp:  [97.9 F (36.6 C)-101.4 F (38.6 C)] 99.1 F (37.3 C) (06/05 0557) Pulse Rate:  [77-100] 100 (06/05 0557) Cardiac Rhythm:  [-] Normal sinus rhythm (06/04 2100) Resp:  [16-25] 16 (06/05 0557) BP: (107-154)/(65-90) 154/87 mmHg (06/05 0557) SpO2:  [9 %-100 %] 91 % (06/05 0557) Weight:  [217 lb 11.2 oz (98.748 kg)] 217 lb 11.2 oz (98.748 kg) (06/05 0322)  Intake/Output from previous day: 06/04 0701 - 06/05 0700 In: 600 [P.O.:600] Out: 650 [Urine:650]  General appearance: alert, cooperative and no distress Heart: regular rate and rhythm Lungs: clear to auscultation bilaterally Abdomen: soft, non-tender; bowel sounds normal; no masses,  no organomegaly Extremities: extremities normal, atraumatic, no cyanosis or edema Wound: clean and dry  Lab Results:  Recent Labs  05/18/13 0432  WBC 17.6*  HGB 9.9*  HCT 30.8*  PLT 524*   BMET:  Recent Labs  05/18/13 0432  NA 140  K 3.3*  CL 100  CO2 28  GLUCOSE 90  BUN 8  CREATININE 0.76  CALCIUM 9.5    PT/INR: No results found for this basename: LABPROT, INR,  in the last 72 hours ABG No results found for this basename: phart, pco2, po2, hco3, tco2, acidbasedef, o2sat   CBG (last 3)  No results found for this basename: GLUCAP,  in the last 72 hours  Assessment/Plan: S/P Procedure(s) (LRB): VIDEO BRONCHOSCOPY WITH ENDOBRONCHIAL ULTRASOUND (N/A) MEDIASTINOSCOPY (N/A)  1. CV- NSR, Hypertensive this morning on Lopressor 25 mg TID, will increase to 50 mg BID 2. Pulm- off oxygen currently with sats in the upper 80s 3. Final Pathology pending- Oncology on board, BM Biopsy complete 4. Dispo- will need to await Oncology recommendations on  chemotherapy regimen.  May need to start prior to discharge.... Will make d/c plans once recommendations placed  LOS: 13 days    BARRETT, ERIN 05/19/2013  Awaiting final pathology and plan from Oncology

## 2013-05-19 NOTE — Progress Notes (Signed)
HOSPITAL PROGRESS NOTE   Megan Mcknight 52 y.o.  1961-03-21    HISTORY: The patient was seen this evening. Her husband Megan Mcknight was present. Patient has dyspnea on minimal exertion such as walking to the bathroom. She is using oxygen as needed. She is having some pain around both flanks. She is continuing to run low-grade fever without obvious infection on Levaquin. The official pathology report on the mediastinal lymph nodes shows an anaplastic large cell lymphoma, ALK +,high-grade. Bone marrow was negative. The PET scan was not carried out yet.  MEDICATIONS:  Scheduled Meds: . [START ON 05/20/2013] allopurinol  300 mg Oral Daily  . atorvastatin  20 mg Oral q1800  . benzonatate  100 mg Oral TID  . enoxaparin  40 mg Subcutaneous Q24H  . levofloxacin  750 mg Oral Daily  . metoprolol tartrate  25 mg Oral TID  . pantoprazole  40 mg Oral Daily  . senna  1 tablet Oral QHS   Continuous Infusions:  PRN Meds:.acetaminophen, acetaminophen, albuterol, alum & mag hydroxide-simeth, guaiFENesin-dextromethorphan, HYDROcodone-acetaminophen, ibuprofen, levalbuterol, montelukast, ondansetron (ZOFRAN) IV, ondansetron, oxyCODONE, sodium chloride  I have reviewed the patient's current medications.  PHYSICAL EXAM:  Blood pressure 122/79, pulse 88, temperature 100.8 F (38.2 C), temperature source Oral, resp. rate 18, height 5\' 8"  (1.727 m), weight 217 lb 11.2 oz (98.748 kg), SpO2 100.00%. General: patient seems slightly short of breath at rest.  HEENT: no scleral icterus. Mouth and pharynx are benign. Lymphatics: no palpable adenopathy. Breasts: not examined. Resp: essentially clear to percussion and auscultation. Cardiac: regular rhythm without murmur or rub. Abdomen: obese but tender in the upper abdomen. No organomegaly or masses are appreciated. Extrem: no peripheral edema or clubbing. IV in right arm. Patient appears to have poor venous access. Neuro: nonfocal.  Skin: no skin  rashes.    LABS:  CBC:   Recent Labs Lab 05/15/13 0435 05/16/13 0420 05/18/13 0432  WBC 10.3 11.7* 17.6*  HGB 8.5* 8.3* 9.9*  HCT 27.0* 25.9* 30.8*  PLT 349 348 524*  MCV 79.6 79.0 79.8  MCH 25.1* 25.3* 25.6*  MCHC 31.5 32.0 32.1  RDW 13.8 13.7 14.0     CHEM:   Recent Labs Lab 05/15/13 0435 05/18/13 0432  NA 138 140  K 3.3* 3.3*  CL 102 100  CO2 26 28  GLUCOSE 102* 90  BUN 7 8  CREATININE 0.71 0.76  CALCIUM 8.7 9.5  AST  --  34  ALT  --  47*  ALKPHOS  --  129*  BILITOT  --  0.3  LDH  --  241  uric acid 3.5 Albumin 2.5 No results found for this basename: INR, PROTIME,  in the last 168 hours   RADIOLOGY:  Ct Biopsy  05/18/2013   *RADIOLOGY REPORT*  Clinical data: Anemia, lymphoma  CT GUIDED LEFT DEEP ILIAC BONE ASPIRATION AND CORE BIOPSY:  Technique: The procedure, risks (including but not limited to bleeding, infection, organ damage), benefits, and alternatives were explained to the patient.  Questions regarding the procedure were encouraged and answered.  The patient understands and consents to the procedure. Patient was placed supine on the CT gantry and limited axial scans through the pelvis were obtained. Appropriate skin entry site was identified. Skin site was marked, prepped with Betadine,   draped in usual sterile fashion, and infiltrated locally with 1% lidocaine.  Intravenous Fentanyl and Versed were administered as conscious sedation during continuous cardiorespiratory monitoring by the radiology RN, with a total moderate sedation time  of eight minutes.  Under CT fluoroscopic guidance an 11-gauge Cook trocar bone needle was advanced into the  iliac bone just lateral to the sacroiliac joint. Once needle tip position was confirmed, coaxial core and aspiration samples were obtained. The final sample was obtained using the guiding needle itself, which was then removed. Postprocedure scans show no hematoma or fracture. Patient tolerated procedure well, with no  immediate complication.  IMPRESSION:  1. Technically successful CT guided  iliac bone core and aspiration biopsy.   Original Report Authenticated By: D. Andria Rhein, MD     ASSESSMENT & PLAN: 1. 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  Diagnostic tissue was obtained from mediastinal lymph nodes on 05/13/2013.  Bone marrow carried out on 05/18/2013 was negative.. IPI score is 2.  Patient needs to have a PET scan in order for [place] to complete her staging evaluation prior to initiating chemotherapy which she must have on an urgent basis. Patient is also quite dyspneic with minimal exertion and her condition is too tenuous for her to receive chemotherapy as an outpatient. In addition she is at moderate risk for development of tumor lysis syndrome. Patient will need to be transferred from [facility] to [facility] for chemotherapy and to obtain PET scan. In addition she will probably need a Port-A-Cath.  Chemotherapy will consist of Cytoxan, Adriamycin, vincristine, prednisone and etoposide along with Neupogen/Neulasta.  Anticipated response rates are in the 80% range with an anticipated 5 year survival rate of about 50%.  2. Respiratory insufficiency.  3. Episode of atrial fibrillation with rapid ventricular response on admission.  4. Dyslipidemia.  5. Seasonal allergies.  6. Diverticulosis.  7. Status post hysterectomy.  8. Full CODE STATUS.    I have spoken with Dr. Guerry Bruin about the pathology report and the special studies which he has performed. This evening I have had a detailed discussion with the patient and her husband Megan Mcknight regarding the diagnosis, it's implications and prognosis, as well as treatment recommendations. I have reviewed with them potential side effects and significant toxicities associated with chemotherapy in detail. They are agreeable to proceed with treatment.  I have spoken with  [facility] and Dr. Onalee Hua Tat in order to arrange for the patient's transfer to [facility].  I appreciate [facility] help in caring for Megan Mcknight.  Samul Dada 05/19/2013, 9:07 PM Pager [phone]

## 2013-05-20 ENCOUNTER — Encounter (HOSPITAL_COMMUNITY): Payer: Self-pay | Admitting: Internal Medicine

## 2013-05-20 ENCOUNTER — Encounter: Payer: Self-pay | Admitting: Pharmacist

## 2013-05-20 ENCOUNTER — Inpatient Hospital Stay (HOSPITAL_COMMUNITY): Payer: PRIVATE HEALTH INSURANCE

## 2013-05-20 ENCOUNTER — Other Ambulatory Visit (HOSPITAL_COMMUNITY): Payer: PRIVATE HEALTH INSURANCE

## 2013-05-20 DIAGNOSIS — C8589 Other specified types of non-Hodgkin lymphoma, extranodal and solid organ sites: Secondary | ICD-10-CM

## 2013-05-20 DIAGNOSIS — D649 Anemia, unspecified: Secondary | ICD-10-CM

## 2013-05-20 DIAGNOSIS — R918 Other nonspecific abnormal finding of lung field: Secondary | ICD-10-CM

## 2013-05-20 DIAGNOSIS — K750 Abscess of liver: Secondary | ICD-10-CM

## 2013-05-20 DIAGNOSIS — J189 Pneumonia, unspecified organism: Secondary | ICD-10-CM

## 2013-05-20 DIAGNOSIS — I4891 Unspecified atrial fibrillation: Secondary | ICD-10-CM

## 2013-05-20 DIAGNOSIS — N289 Disorder of kidney and ureter, unspecified: Secondary | ICD-10-CM

## 2013-05-20 LAB — CBC
HCT: 31.9 % — ABNORMAL LOW (ref 36.0–46.0)
Hemoglobin: 10 g/dL — ABNORMAL LOW (ref 12.0–15.0)
MCH: 24.8 pg — ABNORMAL LOW (ref 26.0–34.0)
MCHC: 31.3 g/dL (ref 30.0–36.0)
MCV: 79 fL (ref 78.0–100.0)
Platelets: 535 10*3/uL — ABNORMAL HIGH (ref 150–400)
RBC: 4.04 MIL/uL (ref 3.87–5.11)
RDW: 13.9 % (ref 11.5–15.5)
WBC: 18.9 10*3/uL — ABNORMAL HIGH (ref 4.0–10.5)

## 2013-05-20 LAB — COMPREHENSIVE METABOLIC PANEL
ALT: 40 U/L — ABNORMAL HIGH (ref 0–35)
AST: 26 U/L (ref 0–37)
Albumin: 2.6 g/dL — ABNORMAL LOW (ref 3.5–5.2)
Alkaline Phosphatase: 160 U/L — ABNORMAL HIGH (ref 39–117)
BUN: 9 mg/dL (ref 6–23)
CO2: 31 mEq/L (ref 19–32)
Calcium: 9.6 mg/dL (ref 8.4–10.5)
Chloride: 97 mEq/L (ref 96–112)
Creatinine, Ser: 0.81 mg/dL (ref 0.50–1.10)
GFR calc Af Amer: >90 mL/min (ref >90)
GFR calc non Af Amer: 83 mL/min — ABNORMAL LOW (ref >90)
Glucose, Bld: 102 mg/dL — ABNORMAL HIGH (ref 70–99)
Potassium: 3.5 mEq/L (ref 3.5–5.1)
Sodium: 139 mEq/L (ref 135–145)
Total Bilirubin: 0.4 mg/dL (ref 0.3–1.2)
Total Protein: 8.5 g/dL — ABNORMAL HIGH (ref 6.0–8.3)

## 2013-05-20 MED ORDER — FILGRASTIM 300 MCG/ML IJ SOLN
300.0000 ug | Freq: Every day | INTRAMUSCULAR | Status: DC
  Administered 2013-05-23 – 2013-05-24 (×2): 300 ug via SUBCUTANEOUS
  Filled 2013-05-20 (×4): qty 1

## 2013-05-20 MED ORDER — ALTEPLASE 2 MG IJ SOLR
2.0000 mg | Freq: Once | INTRAMUSCULAR | Status: AC | PRN
  Filled 2013-05-20: qty 2

## 2013-05-20 MED ORDER — DOXORUBICIN HCL CHEMO IV INJECTION 2 MG/ML
40.0000 mg/m2 | Freq: Once | INTRAVENOUS | Status: AC
  Administered 2013-05-20: 86 mg via INTRAVENOUS
  Filled 2013-05-20: qty 43

## 2013-05-20 MED ORDER — PROCHLORPERAZINE EDISYLATE 5 MG/ML IJ SOLN: 10.0000 mg | INTRAMUSCULAR | Status: DC

## 2013-05-20 MED ORDER — SODIUM CHLORIDE 0.9 % IV SOLN
600.0000 mg/m2 | Freq: Once | INTRAVENOUS | Status: AC
  Administered 2013-05-20: 1300 mg via INTRAVENOUS
  Filled 2013-05-20: qty 65

## 2013-05-20 MED ORDER — HEPARIN SOD (PORK) LOCK FLUSH 100 UNIT/ML IV SOLN
250.0000 [IU] | Freq: Once | INTRAVENOUS | Status: AC | PRN
  Filled 2013-05-20: qty 3

## 2013-05-20 MED ORDER — SODIUM CHLORIDE 0.9 % IV SOLN
Freq: Once | INTRAVENOUS | Status: AC
  Administered 2013-05-20: 36 mg via INTRAVENOUS
  Filled 2013-05-20: qty 8

## 2013-05-20 MED ORDER — SODIUM CHLORIDE 0.9 % IV SOLN
8.0000 mg | Freq: Three times a day (TID) | INTRAVENOUS | Status: DC | PRN
  Administered 2013-05-22: 8 mg via INTRAVENOUS
  Filled 2013-05-20 (×2): qty 4

## 2013-05-20 MED ORDER — SODIUM CHLORIDE 0.9 % IJ SOLN: 10.0000 mL | INTRAMUSCULAR | Status: DC | PRN

## 2013-05-20 MED ORDER — BENZONATATE 100 MG PO CAPS
200.0000 mg | ORAL_CAPSULE | Freq: Three times a day (TID) | ORAL | Status: DC
  Administered 2013-05-20 – 2013-05-24 (×12): 200 mg via ORAL
  Filled 2013-05-20 (×14): qty 2

## 2013-05-20 MED ORDER — HEPARIN SOD (PORK) LOCK FLUSH 100 UNIT/ML IV SOLN
500.0000 [IU] | Freq: Once | INTRAVENOUS | Status: AC | PRN
  Filled 2013-05-20: qty 5

## 2013-05-20 MED ORDER — SODIUM CHLORIDE 0.9 % IV SOLN
Freq: Once | INTRAVENOUS | Status: AC
  Administered 2013-05-20: 15:00:00 via INTRAVENOUS

## 2013-05-20 MED ORDER — PREDNISONE 50 MG PO TABS
100.0000 mg | ORAL_TABLET | Freq: Every day | ORAL | Status: DC
  Administered 2013-05-21 – 2013-05-24 (×4): 100 mg via ORAL
  Filled 2013-05-20 (×5): qty 2

## 2013-05-20 MED ORDER — ONDANSETRON 8 MG/NS 50 ML IVPB
8.0000 mg | INTRAVENOUS | Status: AC
  Administered 2013-05-21 – 2013-05-22 (×2): 8 mg via INTRAVENOUS
  Filled 2013-05-20 (×2): qty 8

## 2013-05-20 MED ORDER — SODIUM CHLORIDE 0.9 % IJ SOLN: 3.0000 mL | INTRAMUSCULAR | Status: DC | PRN

## 2013-05-20 MED ORDER — SODIUM CHLORIDE 0.9 % IV SOLN
80.0000 mg/m2 | INTRAVENOUS | Status: AC
  Administered 2013-05-20 – 2013-05-22 (×3): 170 mg via INTRAVENOUS
  Filled 2013-05-20 (×3): qty 8.5

## 2013-05-20 MED ORDER — VINCRISTINE SULFATE CHEMO INJECTION 1 MG/ML
2.0000 mg | Freq: Once | INTRAVENOUS | Status: AC
  Administered 2013-05-20: 2 mg via INTRAVENOUS
  Filled 2013-05-20: qty 2

## 2013-05-20 NOTE — Progress Notes (Signed)
HOSPITAL PROGRESS NOTE   Megan Mcknight 52 y.o.  03-Sep-1961    HISTORY: Patient doing well except for dyspnea. She is about to complete chemotherapy scheduled for today. She is eating dinner and has no side effects thus far. She is without complaints. The patient continues to have low-grade fever. Blood cultures have been drawn.  MEDICATIONS:  Scheduled Meds: . allopurinol  300 mg Oral Daily  . atorvastatin  20 mg Oral q1800  . benzonatate  200 mg Oral TID  . enoxaparin  40 mg Subcutaneous Q24H  . etoposide  80 mg/m2 (Treatment Plan Actual) Intravenous Q24H  . [START ON 05/23/2013] filgrastim  300 mcg Subcutaneous q1800  . levofloxacin  750 mg Oral Daily  . metoprolol tartrate  25 mg Oral TID  . [START ON 05/21/2013] ondansetron (ZOFRAN) IV  8 mg Intravenous Q24H  . pantoprazole  40 mg Oral Daily  . [START ON 05/21/2013] predniSONE  100 mg Oral Q breakfast  . senna  1 tablet Oral QHS   Continuous Infusions:  PRN Meds:.acetaminophen, acetaminophen, albuterol, alteplase, alum & mag hydroxide-simeth, guaiFENesin-dextromethorphan, heparin lock flush, heparin lock flush, HYDROcodone-acetaminophen, ibuprofen, levalbuterol, montelukast, ondansetron (ZOFRAN) IVPB, oxyCODONE, sodium chloride, sodium chloride, sodium chloride  I have reviewed the patient's current medications.  PHYSICAL EXAM:  Blood pressure 145/76, pulse 92, temperature 98.8 F (37.1 C), temperature source Oral, resp. rate 18, height 5\' 8"  (1.727 m), weight 216 lb 7.9 oz (98.2 kg), SpO2 95.00%. General: In good spirits this evening. Currently on oxygen by nasal cannula 2 L per minute.  HEENT: no scleral icterus. Mouth and pharynx are benign.  Lymphatics: no palpable adenopathy.  Breasts: not examined.  Resp: Inspiratory rales present bilaterally. Cardiac: regular rhythm without murmur or rub.  Abdomen: obese but tender in the upper abdomen. No organomegaly or masses are appreciated.  Extrem: no peripheral edema or  clubbing. IV in left arm. Patient appears to have poor venous access.  Neuro: nonfocal.  Skin: no skin rashes.    LABS:  CBC:   Recent Labs Lab 05/15/13 0435 05/16/13 0420 05/18/13 0432 05/20/13 0450  WBC 10.3 11.7* 17.6* 18.9*  HGB 8.5* 8.3* 9.9* 10.0*  HCT 27.0* 25.9* 30.8* 31.9*  PLT 349 348 524* 535*  MCV 79.6 79.0 79.8 79.0  MCH 25.1* 25.3* 25.6* 24.8*  MCHC 31.5 32.0 32.1 31.3  RDW 13.8 13.7 14.0 13.9     CHEM:   Recent Labs Lab 05/15/13 0435 05/18/13 0432 05/20/13 0450  NA 138 140 139  K 3.3* 3.3* 3.5  CL 102 100 97  CO2 26 28 31   GLUCOSE 102* 90 102*  BUN 7 8 9   CREATININE 0.71 0.76 0.81  CALCIUM 8.7 9.5 9.6  AST  --  34 26  ALT  --  47* 40*  ALKPHOS  --  129* 160*  BILITOT  --  0.3 0.4  LDH  --  241  --     No results found for this basename: INR, PROTIME,  in the last 168 hours HIV was nonreactive. Hepatitis B serology was negative except for hepatitis B surface antibody suggesting previous exposure but no ongoing infection.  RADIOLOGY:  Dg Chest Port 1 View  05/20/2013   *RADIOLOGY REPORT*  Clinical Data: Pneumonia.  Dysrhythmia.  The patient 30 chemotherapy today.  PORTABLE CHEST - 1 VIEW  Comparison: Chest CT 05/27, chest radiograph 05/31.  Findings: Miliary pattern appears similar to the prior exam.  There is no definite superimposed airspace opacity.  Mild cardiomegaly  appears similar to the prior exam.  Tracheal air column appears within normal limits.  Compared to the prior CT, the pattern appears little changed.  This miliary pattern may be due to neoplasm although this would be atypical for lymphoma.  Atypical infection is a definite consideration.  IMPRESSION: No interval change compared to recent CT.  Miliary pattern throughout both lungs, which could be due to hematogenous disease or atypical infection.   Original Report Authenticated By: Andreas Newport, M.D.     ASSESSMENT & PLAN: 1. New diagnosis of anaplastic large cell lymphoma,  ALK+, high-grade with extensive involvement of lungs and liver, stage IV. Diagnostic tissue was obtained from mediastinal lymph nodes on 05/13/2013. Bone marrow carried out on 05/18/2013 was negative.. IPI score is 2.  The patient is about to complete day 1 of chemotherapy consisting of Cytoxan, Adriamycin, vincristine, prednisone and etoposide. The patient will receive Neupogen/Neulasta beginning on day 4, 05/23/2013. Anticipated response rates are in the 80% range with an anticipated 5 year survival rate of about 50%.   2. Respiratory insufficiency.   3. Episode of atrial fibrillation with rapid ventricular response on admission.   4. Dyslipidemia.   5. Seasonal allergies.   6. Diverticulosis.  7. Status post hysterectomy.   8. Full CODE STATUS.   The patient will receive etoposide on Saturday, 05/21/2013 and Sunday, 05/22/2013. Chemistries are being checked daily to monitor for tumor lysis syndrome. While in the hospital she will receive Neupogen 300 mcg subcutaneous starting Monday, 05/23/2013. She is scheduled for Port-A-Cath placement on Monday, 05/23/2013. PET scan could not be done during hospitalization and will therefore need to be carried out after discharge. In addition, the patient will receive Neulasta the day following discharge from the hospital. She is scheduled to receive prednisone 100 mg daily for 5 days. We will need to monitor blood counts closely and transfuse as indicated following discharge.  Appreciate Dr.Oti's assistance with patient management. Oncology team to follow patient over the weekend.   Megan Mcknight S 05/20/2013, 8:22 PM

## 2013-05-20 NOTE — H&P (Signed)
PCP:  [name],[name], MD  Oncology [place]  Chief Complaint:  Patient is being transferred from [facility] to [facility] for chemotherathy  HPI: Megan Mcknight is a 52 y.o. female   has a past medical history of Hyperlipidemia; Nerve pain; Seasonal allergies; Acute bronchitis; Colon polyps (02/2013); Diverticulosis (02/2013); Former light tobacco smoker; and Dysrhythmia.   Presented with  Patient has been originally admitted to [facility] by Dr. [name] on 05/06/13 for what was thought to be CAP and A.fib w RVR. She was on Dr. Felecia Shelling service. She was treated for PNA with Levaquin and Vancomycin and her A.fib has resolved. Despite clinical improvement she ahd worsening infiltrates Pulmonology consult was called and CT scan was done on 5/27 that showed multiple lung and liver nodules. She was transferred to Dr. Dorris Fetch service (CT surgery)  on 05/11/2013 for further evaluation. On 5/30 she has undergone VIDEO BRONCHOSCOPY WITH ENDOBRONCHIAL ULTRASOUND and MEDIASTINOSCOPY. She continued to have fevers post-opeartively and was restarted on empiric antibiotic coverage levaquin. 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 Dr. [name] have been seeing her in consult he wishes for her to  Be transferred to [facility] to start on Chemotherapy while inpatient and have a PET scan done. He anticipates to start on  Cytoxan, Adriamycin, vincristine, prednisone and etoposide along with Neupogen/Neulasta regimen. Dr. [name] has requested [facility] Hospitalist to admit patient to our service while at [facility] due to hx of A. Fib.   Currently patient states she feeling better. Her shortness of breath has improved. Her last was low grade fever today.  SHe states she has lost 3 lb recently. Denies any night sweats.  Review of Systems:    Pertinent positives include: Fevers, chills, fatigue, shortness of breath at rest  dyspnea on exertion, productive cough,  coughing up of  blood. Constitutional:  No weight loss, night sweats, weight loss  HEENT:  No headaches, Difficulty swallowing,Tooth/dental problems,Sore throat,  No sneezing, itching, ear ache, nasal congestion, post nasal drip,  Cardio-vascular:  No chest pain, Orthopnea, PND, anasarca, dizziness, palpitations.no Bilateral lower extremity swelling  GI:  No heartburn, indigestion, abdominal pain, nausea, vomiting, diarrhea, change in bowel habits, loss of appetite, melena, blood in stool, hematemesis Resp:  no , No excess mucus, no No non-productive cough, No No change in color of mucus.No wheezing. Skin:  no rash or lesions. No jaundice GU:  no dysuria, change in color of urine, no urgency or frequency. No straining to urinate.  No flank pain.  Musculoskeletal:  No joint pain or no joint swelling. No decreased range of motion. No back pain.  Psych:  No change in mood or affect. No depression or anxiety. No memory loss.  Neuro: no localizing neurological complaints, no tingling, no weakness, no double vision, no gait abnormality, no slurred speech, no confusion  Otherwise ROS are negative except for above, 10 systems were reviewed  Past Medical History: Past Medical History  Diagnosis Date  . Hyperlipidemia   . Nerve pain     Left leg  . Seasonal allergies   . Acute bronchitis   . Colon polyps 02/2013    Per colonoscopy  . Diverticulosis 02/2013    Per colonoscopy  . Former light tobacco smoker   . Dysrhythmia    Past Surgical History  Procedure Laterality Date  . Partial hysterectomy    . Bones removed from baby toes    . Colonoscopy  02/16/2012    Procedure: COLONOSCOPY;  Surgeon: Corbin Ade, MD;  Location: [facility] ENDO SUITE;  Service: Endoscopy;  Laterality: N/A;  1:30  . Abdominal hysterectomy    . Video bronchoscopy with endobronchial ultrasound N/A 05/13/2013    Procedure: VIDEO BRONCHOSCOPY WITH ENDOBRONCHIAL ULTRASOUND;  Surgeon: Loreli Slot, MD;  Location: [facility] OR;   Service: Thoracic;  Laterality: N/A;  . Mediastinoscopy N/A 05/13/2013    Procedure: MEDIASTINOSCOPY;  Surgeon: Loreli Slot, MD;  Location: [facility] OR;  Service: Thoracic;  Laterality: N/A;     Medications: Prior to Admission medications   Medication Sig Start Date End Date Taking? Authorizing Provider  albuterol (PROVENTIL HFA;VENTOLIN HFA) 108 (90 BASE) MCG/ACT inhaler Inhale 2 puffs into the lungs every 6 (six) hours as needed for wheezing.   Yes Historical Provider, MD  ibuprofen (ADVIL,MOTRIN) 200 MG tablet Take 400 mg by mouth every 6 (six) hours as needed for pain.   Yes Historical Provider, MD  montelukast (SINGULAIR) 10 MG tablet Take 10 mg by mouth daily as needed (Allergies).    Yes Historical Provider, MD  rosuvastatin (CRESTOR) 10 MG tablet Take 10 mg by mouth daily.   Yes Historical Provider, MD    Allergies:   Allergies  Allergen Reactions  . Iodine Anaphylaxis    Told to avoid due to shellfish allergy.  Marland Kitchen Shellfish-Derived Products Anaphylaxis and Swelling    Eyes swelling, scratchy throat, bumps on lips.    Social History:  Ambulatory  independently   Lives at home   reports that she has been smoking Cigarettes.  She has been smoking about 0.50 packs per day. She does not have any smokeless tobacco history on file. She reports that  drinks alcohol. She reports that she does not use illicit drugs.   Family History: family history includes Asthma in an unspecified family member; Colon polyps in her mother; and Heart defect in an unspecified family member.    Physical Exam: Patient Vitals for the past 24 hrs:  BP Temp Temp src Pulse Resp SpO2 Height Weight  05/19/13 2329 117/72 mmHg 99.5 F (37.5 C) Oral 86 18 100 % 5\' 8"  (1.727 m) 98.7 kg (217 lb 9.5 oz)  05/19/13 2214 146/89 mmHg 100.2 F (37.9 C) Oral 102 18 98 % - -  05/19/13 1345 122/79 mmHg 100.8 F (38.2 C) Oral 88 18 100 % - -  05/19/13 0557 154/87 mmHg 99.1 F (37.3 C) Oral 100 16 91 % - -   05/19/13 0331 - - - - - 100 % - -  05/19/13 0322 - - - - - - - 98.748 kg (217 lb 11.2 oz)    1. General:  in No Acute distress 2. Psychological: Alert and   Oriented 3. Head/ENT:   Moist Mucous Membranes                          Head Non traumatic, neck supple                          Normal   Dentition 4. SKIN: normal   Skin turgor,  Skin clean Dry and intact no rash 5. Heart: Regular rate and rhythm no Murmur, Rub or gallop 6. Lungs:  no wheezes occasional crackles   7. Abdomen: Soft, non-tender, Non distended 8. Lower extremities: no clubbing, cyanosis, or edema 9. Neurologically Grossly intact, moving all 4 extremities equally 10. MSK: Normal range of motion  body mass index is  33.09 kg/(m^2).   Labs on Admission:   Recent Labs  05/18/13 0432  NA 140  K 3.3*  CL 100  CO2 28  GLUCOSE 90  BUN 8  CREATININE 0.76  CALCIUM 9.5    Recent Labs  05/18/13 0432  AST 34  ALT 47*  ALKPHOS 129*  BILITOT 0.3  PROT 7.6  ALBUMIN 2.5*   No results found for this basename: LIPASE, AMYLASE,  in the last 72 hours  Recent Labs  05/18/13 0432  WBC 17.6*  HGB 9.9*  HCT 30.8*  MCV 79.8  PLT 524*   No results found for this basename: CKTOTAL, CKMB, CKMBINDEX, TROPONINI,  in the last 72 hours No results found for this basename: TSH, T4TOTAL, FREET3, T3FREE, THYROIDAB,  in the last 72 hours  Recent Labs  05/18/13 0432  FERRITIN 1502*  TIBC 139*  IRON 10*   No results found for this basename: HGBA1C    Estimated Creatinine Clearance: 102.2 ml/min (by C-G formula based on Cr of 0.76). ABG No results found for this basename: phart, pco2, po2, hco3, tco2, acidbasedef, o2sat     No results found for this basename: DDIMER     Other results:  I have pearsonaly reviewed this: ECG REPORT from 05/13/2013  Rate: 71  Rhythm: NSR ST&T Change: no ischemic changes   Cultures:    Component Value Date/Time   SDES URINE, CLEAN CATCH 05/15/2013 1153   SPECREQUEST Normal  05/15/2013 1153   CULT YEAST 05/15/2013 1153   REPTSTATUS 05/16/2013 FINAL 05/15/2013 1153       Radiological Exams on Admission: Ct Biopsy  05/18/2013   *RADIOLOGY REPORT*  Clinical data: Anemia, lymphoma  CT GUIDED LEFT DEEP ILIAC BONE ASPIRATION AND CORE BIOPSY:  Technique: The procedure, risks (including but not limited to bleeding, infection, organ damage), benefits, and alternatives were explained to the patient.  Questions regarding the procedure were encouraged and answered.  The patient understands and consents to the procedure. Patient was placed supine on the CT gantry and limited axial scans through the pelvis were obtained. Appropriate skin entry site was identified. Skin site was marked, prepped with Betadine,   draped in usual sterile fashion, and infiltrated locally with 1% lidocaine.  Intravenous Fentanyl and Versed were administered as conscious sedation during continuous cardiorespiratory monitoring by the radiology RN, with a total moderate sedation time of eight minutes.  Under CT fluoroscopic guidance an 11-gauge Cook trocar bone needle was advanced into the  iliac bone just lateral to the sacroiliac joint. Once needle tip position was confirmed, coaxial core and aspiration samples were obtained. The final sample was obtained using the guiding needle itself, which was then removed. Postprocedure scans show no hematoma or fracture. Patient tolerated procedure well, with no immediate complication.  IMPRESSION:  1. Technically successful CT guided  iliac bone core and aspiration biopsy.   Original Report Authenticated By: D. Andria Rhein, MD    Chart has been reviewed  Assessment/Plan  52 yo With new diagnosis of high grade lymphoma being transferred to [facility] for initiation of chemotherapy.  Present on Admission:  . Other malignant lymphomas, unspecified site, extranodal and solid organ sites - as per oncology, patient is to start chemotherapy and will be monitored. Already written for  Allopurinol . Bilateral pneumonia - continue Levaquin for now. Patient has low grade fevers that could be due to malignancy rather than infection. Would obtain blood cultures and if negative would discontinue antibiotics as she have completed her course and  clinically antibiotics do not seem to make a difference.  . Atrial fibrillation with RVR - this was in the setting of initial PNA now resolved, continue metoprolol and watch on telemetry. TSH was WNL. Echo showed preserved EF at 75%. Low Italy score. No need for anticoagulation at this point.  . Acute renal insufficiency - resolved . Normocytic anemia - likely due to malignancy no need for transfusion at this time.    Prophylaxis: SCD  Protonix  CODE STATUS: FULL CODE  Other plan as per orders.  I have spent a total of  55 min on this admission  Ignatz Deis 05/20/2013, 1:56 AM

## 2013-05-20 NOTE — Progress Notes (Signed)
Patient arrived to unit in stable condition via wheelchair. Will continue to monitor throughout shift.

## 2013-05-20 NOTE — Progress Notes (Signed)
[facility] PROGRESS NOTE  Megan Mcknight:[id number] DOB: 1961-03-21 DOA: 05/06/2013 PCP: [name], MD  Assessment/Plan: Principal Problem:   Shortness of breath Active Problems:   Atrial fibrillation with RVR   Bilateral pneumonia   Acute renal insufficiency   Normocytic anemia   Other malignant lymphomas, unspecified site, extranodal and solid organ sites    1. 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 Per Dr [name], oncologist, patient is to start chemotherapy today.  2. Bilateral pneumonia: Patient was originally admitted to [facility] by Dr. [name] on 05/06/13 for what was thought to be CAP. Initially treated with Vancomycin/Levaquin. Now on Levaquin monotherapy. She continues to have low grade fevers that could be due to malignancy rather than infection. Blood cultures have been repeated, and are pending. Will repeat CXR. Consider possible discontinuation of antibiotics, if interval stability.   3. Atrial fibrillation with RVR: this was evident in the first few days of hospitaization, in the setting of initial PNA. Managed with Metoprolol, patient reverted to SR, and has had no further recurrence. No clinical evidence of ACS, or heart failure. TSH was WNL and 2D echocardiogram showed preserved EF at 75%. Low Italy score. No need for anticoagulation. Have discontinued telemetry today.  4. Acute renal insufficiency: Creatinine at presentation, was 1.54, consistent with AKI. Managed with iv fluids, and creatinine has now normalized. Creatinine is 0.81 today.  5. Normocytic anemia: This is mild, likely due to malignancy, and appears reasonable/stable.     Code Status: Full Code.  Family Communication:  Disposition Plan: to be determined.    Brief narrative: 52 y.o. female with history of  Hyperlipidemia, Nerve pain, Seasonal allergies, Colon polyps (02/2013), Diverticulosis (02/2013), Former tobacco smoker and Dysrhythmia, originally admitted to [facility] by Dr. [name] on 05/06/13 for what was thought to be CAP and A.fib w RVR. She was on Dr. [name] service. She was treated for PNA with Levaquin and Vancomycin and her A.fib resolved. Despite clinical improvement, she had worsening infiltrates, Pulmonology consult was called and CT scan was done on 05/10/13, which showed multiple lung and liver nodules. She was transferred to Dr. Sunday Corn service (CT surgery) on 05/11/13 for further evaluation. On 05/13/13, she underwent video bronchoscopy with endobronchial ultrasound and mediastinoscopy. She continued to have fevers post-opeartively and was restarted on empiric antibiotic coverage with Levaquin. 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 Dr. [name] had been seeing her in consult he requested for her to Be transferred to [facility] to start Chemotherapy while inpatient, and have a PET scan done. He anticipates to start on Cytoxan, Adriamycin, Vincristine, Prednisone and Etoposide along with Neupogen/Neulasta regimen. Dr. [name] has requested [facility] Hospitalist to admit patient to our service while at [facility] due to history of A. Fib.    Consultants:  Dr Orie Rout, oncologist.   Procedures:  See narrative.   Antibiotics:  Levaquin 05/16/13>>>  HPI/Subjective: No new issues, other than constipation and persistent cough.   Objective: Vital signs in last 24 hours: Temp:  [99.5 F (37.5 C)-100.8 F (38.2 C)] 100.8 F (38.2 C) (06/06 0609) Pulse Rate:  [86-102] 94 (06/06 0609) Resp:  [18-20] 20 (06/06 0609) BP: (117-154)/(72-89) 154/82 mmHg (06/06 0609) SpO2:  [96 %-100 %] 96 % (06/06 0609) Weight:  [98.2 kg (216 lb 7.9 oz)-98.7 kg (217  lb 9.5 oz)] 98.2 kg (216 lb 7.9 oz) (06/06 0609) Weight change: -0.048 kg  (-1.7 oz) Last BM Date: 05/18/13  Intake/Output from previous day: 06/05 0701 - 06/06 0700 In: 480 [P.O.:480] Out: 1700 [Urine:1700]     Physical Exam: General: Comfortable, alert, communicative, fully oriented, not short of breath at rest.  HEENT:  Mild clinical pallor, no jaundice, no conjunctival injection or discharge. NECK:  Supple, JVP not seen, no carotid bruits, no palpable lymphadenopathy, no palpable goiter. CHEST:  Clinically clear to auscultation, no wheezes, no crackles. HEART:  Sounds 1 and 2 heard, normal, regular, no murmurs. ABDOMEN:  Moderately obese, soft, non-tender, no palpable organomegaly, no palpable masses, normal bowel sounds. GENITALIA:  Not examined. LOWER EXTREMITIES:  Minimal pitting edema, palpable peripheral pulses. MUSCULOSKELETAL SYSTEM:  unremarkable. CENTRAL NERVOUS SYSTEM:  No focal neurologic deficit on gross examination.  Lab Results:  Recent Labs  05/18/13 0432 05/20/13 0450  WBC 17.6* 18.9*  HGB 9.9* 10.0*  HCT 30.8* 31.9*  PLT 524* 535*    Recent Labs  05/18/13 0432 05/20/13 0450  NA 140 139  K 3.3* 3.5  CL 100 97  CO2 28 31  GLUCOSE 90 102*  BUN 8 9  CREATININE 0.76 0.81  CALCIUM 9.5 9.6   Recent Results (from the past 240 hour(s))  CULTURE, RESPIRATORY (NON-EXPECTORATED)     Status: None   Collection Time    05/13/13 10:03 AM      Result Value Range Status   Specimen Description BRONCHIAL WASHINGS LEFT   Final   Special Requests PATIENT ON FOLLOWING VANCO   Final   Gram Stain     Final   Value: FEW WBC PRESENT, PREDOMINANTLY PMN     RARE SQUAMOUS EPITHELIAL CELLS PRESENT     NO ORGANISMS SEEN   Culture Non-Pathogenic Oropharyngeal-type Flora Isolated.   Final   Report Status 05/15/2013 FINAL   Final  AFB CULTURE WITH SMEAR     Status: None   Collection Time    05/13/13 10:03 AM      Result Value Range Status   Specimen Description BRONCHIAL WASHINGS LEFT   Final   Special Requests PATIENT ON FOLLOWING VANCO    Final   ACID FAST SMEAR NO ACID FAST BACILLI SEEN   Final   Culture     Final   Value: CULTURE WILL BE EXAMINED FOR 6 WEEKS BEFORE ISSUING A FINAL REPORT   Report Status PENDING   Incomplete  FUNGUS CULTURE W SMEAR     Status: None   Collection Time    05/13/13 10:03 AM      Result Value Range Status   Specimen Description BRONCHIAL WASHINGS LEFT   Final   Special Requests PATIENT ON FOLLOWING VANCO   Final   Fungal Smear NO YEAST OR FUNGAL ELEMENTS SEEN   Final   Culture CANDIDA ALBICANS   Final   Report Status PENDING   Incomplete  TISSUE CULTURE     Status: None   Collection Time    05/13/13 11:21 AM      Result Value Range Status   Specimen Description TISSUE LYMPH NODE   Final   Special Requests 4R PT ON VANCO   Final   Gram Stain     Final   Value: NO WBC SEEN     NO ORGANISMS SEEN   Culture NO GROWTH 3 DAYS   Final   Report Status 05/16/2013 FINAL   Final  AFB CULTURE WITH SMEAR  Status: None   Collection Time    05/13/13 11:21 AM      Result Value Range Status   Specimen Description TISSUE LYMPH NODE   Final   Special Requests NONE 4R PT ON VANCO   Final   ACID FAST SMEAR NO ACID FAST BACILLI SEEN   Final   Culture     Final   Value: CULTURE WILL BE EXAMINED FOR 6 WEEKS BEFORE ISSUING A FINAL REPORT   Report Status PENDING   Incomplete  FUNGUS CULTURE W SMEAR     Status: None   Collection Time    05/13/13 11:21 AM      Result Value Range Status   Specimen Description TISSUE LYMPH NODE   Final   Special Requests 4R PT ON VANCO   Final   Fungal Smear NO YEAST OR FUNGAL ELEMENTS SEEN   Final   Culture CULTURE IN PROGRESS FOR FOUR WEEKS   Final   Report Status PENDING   Incomplete  TISSUE CULTURE     Status: None   Collection Time    05/13/13 11:33 AM      Result Value Range Status   Specimen Description TISSUE LYMPH NODE   Final   Special Requests 7 NODE PT ON VANCO   Final   Gram Stain     Final   Value: NO WBC SEEN     NO ORGANISMS SEEN   Culture NO  GROWTH 3 DAYS   Final   Report Status 05/16/2013 FINAL   Final  AFB CULTURE WITH SMEAR     Status: None   Collection Time    05/13/13 11:33 AM      Result Value Range Status   Specimen Description TISSUE LYMPH NODE   Final   Special Requests 7 NODE PT ON VANCO   Final   ACID FAST SMEAR NO ACID FAST BACILLI SEEN   Final   Culture     Final   Value: CULTURE WILL BE EXAMINED FOR 6 WEEKS BEFORE ISSUING A FINAL REPORT   Report Status PENDING   Incomplete  FUNGUS CULTURE W SMEAR     Status: None   Collection Time    05/13/13 11:33 AM      Result Value Range Status   Specimen Description TISSUE LYMPH NODE   Final   Special Requests 7 NODE PT ON VANCO   Final   Fungal Smear NO YEAST OR FUNGAL ELEMENTS SEEN   Final   Culture CULTURE IN PROGRESS FOR FOUR WEEKS   Final   Report Status PENDING   Incomplete  CULTURE, EXPECTORATED SPUTUM-ASSESSMENT     Status: None   Collection Time    05/15/13  9:17 AM      Result Value Range Status   Specimen Description SPUTUM   Final   Special Requests Normal   Final   Sputum evaluation     Final   Value: THIS SPECIMEN IS ACCEPTABLE. RESPIRATORY CULTURE REPORT TO FOLLOW.   Report Status 05/15/2013 FINAL   Final  CULTURE, RESPIRATORY (NON-EXPECTORATED)     Status: None   Collection Time    05/15/13  9:17 AM      Result Value Range Status   Specimen Description SPUTUM   Final   Special Requests NONE   Final   Gram Stain     Final   Value: NO WBC SEEN     NO SQUAMOUS EPITHELIAL CELLS SEEN     NO ORGANISMS SEEN   Culture  NORMAL OROPHARYNGEAL FLORA   Final   Report Status 05/17/2013 FINAL   Final  URINE CULTURE     Status: None   Collection Time    05/15/13 11:53 AM      Result Value Range Status   Specimen Description URINE, CLEAN CATCH   Final   Special Requests Normal   Final   Culture  Setup Time 05/15/2013 19:16   Final   Colony Count 40,000 COLONIES/ML   Final   Culture YEAST   Final   Report Status 05/16/2013 FINAL   Final      Studies/Results: Ct Biopsy  05/18/2013   *RADIOLOGY REPORT*  Clinical data: Anemia, lymphoma  CT GUIDED LEFT DEEP ILIAC BONE ASPIRATION AND CORE BIOPSY:  Technique: The procedure, risks (including but not limited to bleeding, infection, organ damage), benefits, and alternatives were explained to the patient.  Questions regarding the procedure were encouraged and answered.  The patient understands and consents to the procedure. Patient was placed supine on the CT gantry and limited axial scans through the pelvis were obtained. Appropriate skin entry site was identified. Skin site was marked, prepped with Betadine,   draped in usual sterile fashion, and infiltrated locally with 1% lidocaine.  Intravenous Fentanyl and Versed were administered as conscious sedation during continuous cardiorespiratory monitoring by the radiology RN, with a total moderate sedation time of eight minutes.  Under CT fluoroscopic guidance an 11-gauge Cook trocar bone needle was advanced into the  iliac bone just lateral to the sacroiliac joint. Once needle tip position was confirmed, coaxial core and aspiration samples were obtained. The final sample was obtained using the guiding needle itself, which was then removed. Postprocedure scans show no hematoma or fracture. Patient tolerated procedure well, with no immediate complication.  IMPRESSION:  1. Technically successful CT guided  iliac bone core and aspiration biopsy.   Original Report Authenticated By: D. Andria Rhein, MD    Medications: Scheduled Meds: . allopurinol  300 mg Oral Daily  . atorvastatin  20 mg Oral q1800  . benzonatate  100 mg Oral TID  . enoxaparin  40 mg Subcutaneous Q24H  . levofloxacin  750 mg Oral Daily  . metoprolol tartrate  25 mg Oral TID  . pantoprazole  40 mg Oral Daily  . senna  1 tablet Oral QHS   Continuous Infusions:  PRN Meds:.acetaminophen, acetaminophen, albuterol, alum & mag hydroxide-simeth, guaiFENesin-dextromethorphan,  HYDROcodone-acetaminophen, ibuprofen, levalbuterol, montelukast, ondansetron (ZOFRAN) IV, ondansetron, oxyCODONE, sodium chloride    LOS: 14 days   Juna Caban,CHRISTOPHER  [facility] Pager [phone]. If 8PM-8AM, please contact night-coverage at [email or web address], password [facility] 05/20/2013, 7:13 AM  LOS: 14 days

## 2013-05-21 DIAGNOSIS — Z5111 Encounter for antineoplastic chemotherapy: Secondary | ICD-10-CM

## 2013-05-21 DIAGNOSIS — IMO0002 Reserved for concepts with insufficient information to code with codable children: Secondary | ICD-10-CM

## 2013-05-21 LAB — COMPREHENSIVE METABOLIC PANEL
ALT: 46 U/L — ABNORMAL HIGH (ref 0–35)
AST: 40 U/L — ABNORMAL HIGH (ref 0–37)
Albumin: 2.2 g/dL — ABNORMAL LOW (ref 3.5–5.2)
Alkaline Phosphatase: 157 U/L — ABNORMAL HIGH (ref 39–117)
BUN: 13 mg/dL (ref 6–23)
CO2: 28 mEq/L (ref 19–32)
Calcium: 8.7 mg/dL (ref 8.4–10.5)
Chloride: 101 mEq/L (ref 96–112)
Creatinine, Ser: 0.66 mg/dL (ref 0.50–1.10)
GFR calc Af Amer: >90 mL/min (ref >90)
GFR calc non Af Amer: >90 mL/min (ref >90)
Glucose, Bld: 201 mg/dL — ABNORMAL HIGH (ref 70–99)
Potassium: 3.4 mEq/L — ABNORMAL LOW (ref 3.5–5.1)
Sodium: 139 mEq/L (ref 135–145)
Total Bilirubin: 0.3 mg/dL (ref 0.3–1.2)
Total Protein: 7.2 g/dL (ref 6.0–8.3)

## 2013-05-21 LAB — CBC
HCT: 27.8 % — ABNORMAL LOW (ref 36.0–46.0)
Hemoglobin: 8.5 g/dL — ABNORMAL LOW (ref 12.0–15.0)
MCH: 24.1 pg — ABNORMAL LOW (ref 26.0–34.0)
MCHC: 30.6 g/dL (ref 30.0–36.0)
MCV: 79 fL (ref 78.0–100.0)
Platelets: 435 10*3/uL — ABNORMAL HIGH (ref 150–400)
RBC: 3.52 MIL/uL — ABNORMAL LOW (ref 3.87–5.11)
RDW: 14.1 % (ref 11.5–15.5)
WBC: 20.9 10*3/uL — ABNORMAL HIGH (ref 4.0–10.5)

## 2013-05-21 MED ORDER — HYDROCORTISONE 2.5 % RE CREA
TOPICAL_CREAM | Freq: Two times a day (BID) | RECTAL | Status: DC
  Administered 2013-05-22 – 2013-05-24 (×5): via RECTAL
  Filled 2013-05-21: qty 28.35

## 2013-05-21 NOTE — Progress Notes (Signed)
[facility] PROGRESS NOTE  Megan Mcknight GNF:[id number] DOB: 1961-09-01 DOA: 05/06/2013 PCP: [name], MD  Assessment/Plan: Principal Problem:   Shortness of breath Active Problems:   Atrial fibrillation with RVR   Bilateral pneumonia   Acute renal insufficiency   Normocytic anemia   Other malignant lymphomas, unspecified site, extranodal and solid organ sites    1. 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 Per Dr [name], oncologist, chemotherapy started on 05/20/13, and well tolerated. She is scheduled for Port-A-Cath placement on Monday, 05/23/13. 2. Bilateral pneumonia: Patient was originally admitted to [facility] by Dr. [name] on 05/06/13 for what was thought to be CAP. Initially treated with Vancomycin/Levaquin. Now on Levaquin monotherapy. She continued to have low grade fevers that could be due to malignancy rather than infection, but has remained afebrile over the last 24 hours, and has only a mild cough, productive of clear phlegm. Blood cultures were repeated on 05/20/13, and have revealed no growth so far. CXR of 05/20/13 showed no interval change compared to recent CT. Patient is now on day 312 of antibiotic therapy. Have discontinued antibiotics today.  3. Atrial fibrillation with RVR: this was evident in the first few days of hospitaization, in the setting of initial PNA. Managed with Metoprolol, patient reverted to SR, and has had no further recurrence. No clinical evidence of ACS, or heart failure. TSH was WNL and 2D echocardiogram showed preserved EF at 75%. Low [place] score. No need for anticoagulation. Have discontinued telemetry as of 05/20/13.  4. Acute renal insufficiency: Creatinine at presentation, was 1.54, consistent with AKI. Managed with iv fluids, and creatinine has now normalized. Creatinine  had normalized at 0.81 as of 05/20/13.  5. Normocytic anemia: This is mild, likely due to malignancy, and appears reasonable/stable.     Code Status: Full Code.  Family Communication:  Disposition Plan: to be determined. Per Oncology.    Brief narrative: 52 y.o. female with history of Hyperlipidemia, Nerve pain, Seasonal allergies, Colon polyps (02/2013), Diverticulosis (02/2013), Former tobacco smoker and Dysrhythmia, originally admitted to [facility] by Dr. [name] on 05/06/13 for what was thought to be CAP and A.fib w RVR. She was on Dr. [name] service. She was treated for PNA with Levaquin and Vancomycin and her A.fib resolved. Despite clinical improvement, she had worsening infiltrates, Pulmonology consult was called and CT scan was done on 05/10/13, which showed multiple lung and liver nodules. She was transferred to Dr. Sunday Corn service (CT surgery) on 05/11/13 for further evaluation. On 05/13/13, she underwent video bronchoscopy with endobronchial ultrasound and mediastinoscopy. She continued to have fevers post-opeartively and was restarted on empiric antibiotic coverage with Levaquin. 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 Dr. [name] had been seeing her in consult he requested for her to Be transferred to [facility] to start Chemotherapy while inpatient, and have a PET scan done. He anticipates to start on Cytoxan, Adriamycin, Vincristine, Prednisone and Etoposide along with Neupogen/Neulasta regimen. Dr. [name] has requested [facility] to admit patient to our service while at [facility] due to history of A. Fib.    Consultants:  Dr Orie Rout, oncologist.   Procedures:  See narrative.   Antibiotics:  Levaquin 05/16/13-05/21/13.  HPI/Subjective: Had good bowel movement today. Only mild cough with clear phlegm.   Objective: Vital signs in last 24  hours: Temp:  [98 F (36.7 C)-98.8 F (37.1 C)] 98 F (36.7 C)  (06/07 0435) Pulse Rate:  [79-102] 79 (06/07 0435) Resp:  [16-20] 16 (06/07 0435) BP: (125-154)/(69-80) 154/80 mmHg (06/07 0435) SpO2:  [95 %-98 %] 97 % (06/07 0435) Weight change:  Last BM Date: 05/17/13  Intake/Output from previous day: 06/06 0701 - 06/07 0700 In: 840 [P.O.:840] Out: 2400 [Urine:2400] Total I/O In: 240 [P.O.:240] Out: 400 [Urine:400]   Physical Exam: General: Comfortable, alert, communicative, fully oriented, not short of breath at rest.  HEENT:  Mild clinical pallor, no jaundice, no conjunctival injection or discharge. NECK:  Supple, JVP not seen, no carotid bruits, no palpable lymphadenopathy, no palpable goiter. CHEST:  Clinically clear to auscultation, no wheezes, no crackles. HEART:  Sounds 1 and 2 heard, normal, regular, no murmurs. ABDOMEN:  Moderately obese, soft, non-tender, no palpable organomegaly, no palpable masses, normal bowel sounds. GENITALIA:  Not examined. LOWER EXTREMITIES:  Minimal pitting edema, palpable peripheral pulses. MUSCULOSKELETAL SYSTEM:  unremarkable. CENTRAL NERVOUS SYSTEM:  No focal neurologic deficit on gross examination.  Lab Results:  Recent Labs  05/20/13 0450 05/21/13 0429  WBC 18.9* 20.9*  HGB 10.0* 8.5*  HCT 31.9* 27.8*  PLT 535* 435*    Recent Labs  05/20/13 0450 05/21/13 0429  NA 139 139  K 3.5 3.4*  CL 97 101  CO2 31 28  GLUCOSE 102* 201*  BUN 9 13  CREATININE 0.81 0.66  CALCIUM 9.6 8.7   Recent Results (from the past 240 hour(s))  CULTURE, RESPIRATORY (NON-EXPECTORATED)     Status: None   Collection Time    05/13/13 10:03 AM      Result Value Range Status   Specimen Description BRONCHIAL WASHINGS LEFT   Final   Special Requests PATIENT ON FOLLOWING VANCO   Final   Gram Stain     Final   Value: FEW WBC PRESENT, PREDOMINANTLY PMN     RARE SQUAMOUS EPITHELIAL CELLS PRESENT     NO ORGANISMS SEEN   Culture Non-Pathogenic Oropharyngeal-type Flora Isolated.   Final   Report Status 05/15/2013  FINAL   Final  AFB CULTURE WITH SMEAR     Status: None   Collection Time    05/13/13 10:03 AM      Result Value Range Status   Specimen Description BRONCHIAL WASHINGS LEFT   Final   Special Requests PATIENT ON FOLLOWING VANCO   Final   ACID FAST SMEAR NO ACID FAST BACILLI SEEN   Final   Culture     Final   Value: CULTURE WILL BE EXAMINED FOR 6 WEEKS BEFORE ISSUING A FINAL REPORT   Report Status PENDING   Incomplete  FUNGUS CULTURE W SMEAR     Status: None   Collection Time    05/13/13 10:03 AM      Result Value Range Status   Specimen Description BRONCHIAL WASHINGS LEFT   Final   Special Requests PATIENT ON FOLLOWING VANCO   Final   Fungal Smear NO YEAST OR FUNGAL ELEMENTS SEEN   Final   Culture CANDIDA ALBICANS   Final   Report Status PENDING   Incomplete  TISSUE CULTURE     Status: None   Collection Time    05/13/13 11:21 AM      Result Value Range Status   Specimen Description TISSUE LYMPH NODE   Final   Special Requests 4R PT ON VANCO   Final   Gram Stain     Final  Value: NO WBC SEEN     NO ORGANISMS SEEN   Culture NO GROWTH 3 DAYS   Final   Report Status 05/16/2013 FINAL   Final  AFB CULTURE WITH SMEAR     Status: None   Collection Time    05/13/13 11:21 AM      Result Value Range Status   Specimen Description TISSUE LYMPH NODE   Final   Special Requests NONE 4R PT ON VANCO   Final   ACID FAST SMEAR NO ACID FAST BACILLI SEEN   Final   Culture     Final   Value: CULTURE WILL BE EXAMINED FOR 6 WEEKS BEFORE ISSUING A FINAL REPORT   Report Status PENDING   Incomplete  FUNGUS CULTURE W SMEAR     Status: None   Collection Time    05/13/13 11:21 AM      Result Value Range Status   Specimen Description TISSUE LYMPH NODE   Final   Special Requests 4R PT ON VANCO   Final   Fungal Smear NO YEAST OR FUNGAL ELEMENTS SEEN   Final   Culture CULTURE IN PROGRESS FOR FOUR WEEKS   Final   Report Status PENDING   Incomplete  TISSUE CULTURE     Status: None   Collection Time     05/13/13 11:33 AM      Result Value Range Status   Specimen Description TISSUE LYMPH NODE   Final   Special Requests 7 NODE PT ON VANCO   Final   Gram Stain     Final   Value: NO WBC SEEN     NO ORGANISMS SEEN   Culture NO GROWTH 3 DAYS   Final   Report Status 05/16/2013 FINAL   Final  AFB CULTURE WITH SMEAR     Status: None   Collection Time    05/13/13 11:33 AM      Result Value Range Status   Specimen Description TISSUE LYMPH NODE   Final   Special Requests 7 NODE PT ON VANCO   Final   ACID FAST SMEAR NO ACID FAST BACILLI SEEN   Final   Culture     Final   Value: CULTURE WILL BE EXAMINED FOR 6 WEEKS BEFORE ISSUING A FINAL REPORT   Report Status PENDING   Incomplete  FUNGUS CULTURE W SMEAR     Status: None   Collection Time    05/13/13 11:33 AM      Result Value Range Status   Specimen Description TISSUE LYMPH NODE   Final   Special Requests 7 NODE PT ON VANCO   Final   Fungal Smear NO YEAST OR FUNGAL ELEMENTS SEEN   Final   Culture CULTURE IN PROGRESS FOR FOUR WEEKS   Final   Report Status PENDING   Incomplete  CULTURE, EXPECTORATED SPUTUM-ASSESSMENT     Status: None   Collection Time    05/15/13  9:17 AM      Result Value Range Status   Specimen Description SPUTUM   Final   Special Requests Normal   Final   Sputum evaluation     Final   Value: THIS SPECIMEN IS ACCEPTABLE. RESPIRATORY CULTURE REPORT TO FOLLOW.   Report Status 05/15/2013 FINAL   Final  CULTURE, RESPIRATORY (NON-EXPECTORATED)     Status: None   Collection Time    05/15/13  9:17 AM      Result Value Range Status   Specimen Description SPUTUM   Final   Special  Requests NONE   Final   Gram Stain     Final   Value: NO WBC SEEN     NO SQUAMOUS EPITHELIAL CELLS SEEN     NO ORGANISMS SEEN   Culture NORMAL OROPHARYNGEAL FLORA   Final   Report Status 05/17/2013 FINAL   Final  URINE CULTURE     Status: None   Collection Time    05/15/13 11:53 AM      Result Value Range Status   Specimen Description URINE,  CLEAN CATCH   Final   Special Requests Normal   Final   Culture  Setup Time 05/15/2013 19:16   Final   Colony Count 40,000 COLONIES/ML   Final   Culture YEAST   Final   Report Status 05/16/2013 FINAL   Final     Studies/Results: Dg Chest Port 1 View  05/20/2013   *RADIOLOGY REPORT*  Clinical Data: Pneumonia.  Dysrhythmia.  The patient 30 chemotherapy today.  PORTABLE CHEST - 1 VIEW  Comparison: Chest CT 05/27, chest radiograph 05/31.  Findings: Miliary pattern appears similar to the prior exam.  There is no definite superimposed airspace opacity.  Mild cardiomegaly appears similar to the prior exam.  Tracheal air column appears within normal limits.  Compared to the prior CT, the pattern appears little changed.  This miliary pattern may be due to neoplasm although this would be atypical for lymphoma.  Atypical infection is a definite consideration.  IMPRESSION: No interval change compared to recent CT.  Miliary pattern throughout both lungs, which could be due to hematogenous disease or atypical infection.   Original Report Authenticated By: Andreas Newport, M.D.    Medications: Scheduled Meds: . allopurinol  300 mg Oral Daily  . atorvastatin  20 mg Oral q1800  . benzonatate  200 mg Oral TID  . enoxaparin  40 mg Subcutaneous Q24H  . etoposide  80 mg/m2 (Treatment Plan Actual) Intravenous Q24H  . [START ON 05/23/2013] filgrastim  300 mcg Subcutaneous q1800  . levofloxacin  750 mg Oral Daily  . metoprolol tartrate  25 mg Oral TID  . ondansetron (ZOFRAN) IV  8 mg Intravenous Q24H  . pantoprazole  40 mg Oral Daily  . predniSONE  100 mg Oral Q breakfast  . senna  1 tablet Oral QHS   Continuous Infusions:  PRN Meds:.acetaminophen, acetaminophen, albuterol, alum & mag hydroxide-simeth, guaiFENesin-dextromethorphan, HYDROcodone-acetaminophen, ibuprofen, levalbuterol, montelukast, ondansetron (ZOFRAN) IVPB, oxyCODONE, sodium chloride, sodium chloride, sodium chloride    LOS: 15 days    Anabelen Kaminsky,CHRISTOPHER  [facility] Pager [phone]. If 8PM-8AM, please contact night-coverage at [email or web address], password [facility] 05/21/2013, 1:04 PM  LOS: 15 days

## 2013-05-22 DIAGNOSIS — R110 Nausea: Secondary | ICD-10-CM

## 2013-05-22 DIAGNOSIS — I4891 Unspecified atrial fibrillation: Secondary | ICD-10-CM

## 2013-05-22 LAB — COMPREHENSIVE METABOLIC PANEL
ALT: 103 U/L — ABNORMAL HIGH (ref 0–35)
AST: 99 U/L — ABNORMAL HIGH (ref 0–37)
Albumin: 2.1 g/dL — ABNORMAL LOW (ref 3.5–5.2)
Alkaline Phosphatase: 133 U/L — ABNORMAL HIGH (ref 39–117)
BUN: 17 mg/dL (ref 6–23)
CO2: 27 mEq/L (ref 19–32)
Calcium: 8.8 mg/dL (ref 8.4–10.5)
Chloride: 105 mEq/L (ref 96–112)
Creatinine, Ser: 0.81 mg/dL (ref 0.50–1.10)
GFR calc Af Amer: >90 mL/min (ref >90)
GFR calc non Af Amer: 83 mL/min — ABNORMAL LOW (ref >90)
Glucose, Bld: 210 mg/dL — ABNORMAL HIGH (ref 70–99)
Potassium: 3.7 mEq/L (ref 3.5–5.1)
Sodium: 140 mEq/L (ref 135–145)
Total Bilirubin: 0.2 mg/dL — ABNORMAL LOW (ref 0.3–1.2)
Total Protein: 6.6 g/dL (ref 6.0–8.3)

## 2013-05-22 LAB — CBC
HCT: 26.4 % — ABNORMAL LOW (ref 36.0–46.0)
Hemoglobin: 8.3 g/dL — ABNORMAL LOW (ref 12.0–15.0)
MCH: 24.7 pg — ABNORMAL LOW (ref 26.0–34.0)
MCHC: 31.4 g/dL (ref 30.0–36.0)
MCV: 78.6 fL (ref 78.0–100.0)
Platelets: 499 10*3/uL — ABNORMAL HIGH (ref 150–400)
RBC: 3.36 MIL/uL — ABNORMAL LOW (ref 3.87–5.11)
RDW: 13.9 % (ref 11.5–15.5)
WBC: 12.8 10*3/uL — ABNORMAL HIGH (ref 4.0–10.5)

## 2013-05-22 MED ORDER — PROCHLORPERAZINE EDISYLATE 5 MG/ML IJ SOLN: 10.0000 mg | Freq: Four times a day (QID) | INTRAMUSCULAR | Status: DC | PRN

## 2013-05-22 MED ORDER — PANTOPRAZOLE SODIUM 40 MG IV SOLR
40.0000 mg | INTRAVENOUS | Status: DC
  Administered 2013-05-22 – 2013-05-23 (×2): 40 mg via INTRAVENOUS
  Filled 2013-05-22 (×2): qty 40

## 2013-05-22 MED ORDER — AMLODIPINE BESYLATE 5 MG PO TABS
5.0000 mg | ORAL_TABLET | Freq: Every day | ORAL | Status: DC
  Administered 2013-05-22 – 2013-05-24 (×3): 5 mg via ORAL
  Filled 2013-05-22 (×3): qty 1

## 2013-05-22 MED ORDER — SODIUM CHLORIDE 0.9 % IV SOLN
INTRAVENOUS | Status: DC
  Administered 2013-05-22 – 2013-05-23 (×2): via INTRAVENOUS

## 2013-05-22 MED ORDER — LORAZEPAM 2 MG/ML IJ SOLN: 1.0000 mg | Freq: Four times a day (QID) | INTRAMUSCULAR | Status: DC | PRN

## 2013-05-22 NOTE — Progress Notes (Signed)
Megan Mcknight   DOB:1961/01/02   ZO#:[id number]   JWJ#:[id number]  Subjective: patient is day 2 of her chemotherapy for newly diagnosed anaplastic large cell lymphoma ALK Positive high grade with extensive involvement of lungs and liver.since starting chemotherapy she feels much better. She is reading much better. She is denying any fevers or chills or night sweats. She is sitting up and eating breakfast. She is tolerating chemotherapy well   Objective:  Filed Vitals:   05/21/13 2123  BP: 143/84  Pulse: 80  Temp: 98.4 F (36.9 C)  Resp: 18    Body mass index is 32.93 kg/(m^2).  Intake/Output Summary (Last 24 hours) at 05/22/13 0051 Last data filed at 05/21/13 1700  Gross per 24 hour  Intake    960 ml  Output   1200 ml  Net   -240 ml     Sclerae unicteric  Oropharynx clear  No peripheral adenopathy  Lungs clear -- no rales or rhonchi  Heart regular rate and rhythm  Abdomen benign, tender  in the upper abdomen  MSK no focal spinal tenderness, no peripheral edema  Neuro nonfocal    CBG (last 3)  No results found for this basename: GLUCAP,  in the last 72 hours   Labs:  Lab Results  Component Value Date   WBC 20.9* 05/21/2013   HGB 8.5* 05/21/2013   HCT 27.8* 05/21/2013   MCV 79.0 05/21/2013   PLT 435* 05/21/2013   NEUTROABS 14.7* 05/06/2013    Urine Studies No results found for this basename: UACOL, UAPR, USPG, UPH, UTP, UGL, UKET, UBIL, UHGB, UNIT, UROB, ULEU, UEPI, UWBC, URBC, UBAC, CAST, CRYS, UCOM, BILUA,  in the last 72 hours  Basic Metabolic Panel:  Recent Labs Lab 05/15/13 0435 05/18/13 0432 05/20/13 0450 05/21/13 0429  NA 138 140 139 139  K 3.3* 3.3* 3.5 3.4*  CL 102 100 97 101  CO2 26 28 31 28   GLUCOSE 102* 90 102* 201*  BUN 7 8 9 13   CREATININE 0.71 0.76 0.81 0.66  CALCIUM 8.7 9.5 9.6 8.7   GFR Estimated Creatinine Clearance: 101.9 ml/min (by C-G formula based on Cr of 0.66). Liver Function Tests:  Recent Labs Lab 05/18/13 0432 05/20/13 0450  05/21/13 0429  AST 34 26 40*  ALT 47* 40* 46*  ALKPHOS 129* 160* 157*  BILITOT 0.3 0.4 0.3  PROT 7.6 8.5* 7.2  ALBUMIN 2.5* 2.6* 2.2*   No results found for this basename: LIPASE, AMYLASE,  in the last 168 hours No results found for this basename: AMMONIA,  in the last 168 hours Coagulation profile No results found for this basename: INR, PROTIME,  in the last 168 hours  CBC:  Recent Labs Lab 05/15/13 0435 05/16/13 0420 05/18/13 0432 05/20/13 0450 05/21/13 0429  WBC 10.3 11.7* 17.6* 18.9* 20.9*  HGB 8.5* 8.3* 9.9* 10.0* 8.5*  HCT 27.0* 25.9* 30.8* 31.9* 27.8*  MCV 79.6 79.0 79.8 79.0 79.0  PLT 349 348 524* 535* 435*   Cardiac Enzymes: No results found for this basename: CKTOTAL, CKMB, CKMBINDEX, TROPONINI,  in the last 168 hours BNP: No components found with this basename: POCBNP,  CBG: No results found for this basename: GLUCAP,  in the last 168 hours D-Dimer No results found for this basename: DDIMER,  in the last 72 hours Hgb A1c No results found for this basename: HGBA1C,  in the last 72 hours Lipid Profile No results found for this basename: CHOL, HDL, LDLCALC, TRIG, CHOLHDL, LDLDIRECT,  in the last  72 hours Thyroid function studies No results found for this basename: TSH, T4TOTAL, FREET3, T3FREE, THYROIDAB,  in the last 72 hours Anemia work up No results found for this basename: VITAMINB12, FOLATE, FERRITIN, TIBC, IRON, RETICCTPCT,  in the last 72 hours Microbiology Recent Results (from the past 240 hour(s))  CULTURE, RESPIRATORY (NON-EXPECTORATED)     Status: None   Collection Time    05/13/13 10:03 AM      Result Value Range Status   Specimen Description BRONCHIAL WASHINGS LEFT   Final   Special Requests PATIENT ON FOLLOWING VANCO   Final   Gram Stain     Final   Value: FEW WBC PRESENT, PREDOMINANTLY PMN     RARE SQUAMOUS EPITHELIAL CELLS PRESENT     NO ORGANISMS SEEN   Culture Non-Pathogenic Oropharyngeal-type Flora Isolated.   Final   Report Status  05/15/2013 FINAL   Final  AFB CULTURE WITH SMEAR     Status: None   Collection Time    05/13/13 10:03 AM      Result Value Range Status   Specimen Description BRONCHIAL WASHINGS LEFT   Final   Special Requests PATIENT ON FOLLOWING VANCO   Final   ACID FAST SMEAR NO ACID FAST BACILLI SEEN   Final   Culture     Final   Value: CULTURE WILL BE EXAMINED FOR 6 WEEKS BEFORE ISSUING A FINAL REPORT   Report Status PENDING   Incomplete  FUNGUS CULTURE W SMEAR     Status: None   Collection Time    05/13/13 10:03 AM      Result Value Range Status   Specimen Description BRONCHIAL WASHINGS LEFT   Final   Special Requests PATIENT ON FOLLOWING VANCO   Final   Fungal Smear NO YEAST OR FUNGAL ELEMENTS SEEN   Final   Culture CANDIDA ALBICANS   Final   Report Status PENDING   Incomplete  TISSUE CULTURE     Status: None   Collection Time    05/13/13 11:21 AM      Result Value Range Status   Specimen Description TISSUE LYMPH NODE   Final   Special Requests 4R PT ON VANCO   Final   Gram Stain     Final   Value: NO WBC SEEN     NO ORGANISMS SEEN   Culture NO GROWTH 3 DAYS   Final   Report Status 05/16/2013 FINAL   Final  AFB CULTURE WITH SMEAR     Status: None   Collection Time    05/13/13 11:21 AM      Result Value Range Status   Specimen Description TISSUE LYMPH NODE   Final   Special Requests NONE 4R PT ON VANCO   Final   ACID FAST SMEAR NO ACID FAST BACILLI SEEN   Final   Culture     Final   Value: CULTURE WILL BE EXAMINED FOR 6 WEEKS BEFORE ISSUING A FINAL REPORT   Report Status PENDING   Incomplete  FUNGUS CULTURE W SMEAR     Status: None   Collection Time    05/13/13 11:21 AM      Result Value Range Status   Specimen Description TISSUE LYMPH NODE   Final   Special Requests 4R PT ON VANCO   Final   Fungal Smear NO YEAST OR FUNGAL ELEMENTS SEEN   Final   Culture CULTURE IN PROGRESS FOR FOUR WEEKS   Final   Report Status PENDING   Incomplete  TISSUE CULTURE     Status: None    Collection Time    05/13/13 11:33 AM      Result Value Range Status   Specimen Description TISSUE LYMPH NODE   Final   Special Requests 7 NODE PT ON VANCO   Final   Gram Stain     Final   Value: NO WBC SEEN     NO ORGANISMS SEEN   Culture NO GROWTH 3 DAYS   Final   Report Status 05/16/2013 FINAL   Final  AFB CULTURE WITH SMEAR     Status: None   Collection Time    05/13/13 11:33 AM      Result Value Range Status   Specimen Description TISSUE LYMPH NODE   Final   Special Requests 7 NODE PT ON VANCO   Final   ACID FAST SMEAR NO ACID FAST BACILLI SEEN   Final   Culture     Final   Value: CULTURE WILL BE EXAMINED FOR 6 WEEKS BEFORE ISSUING A FINAL REPORT   Report Status PENDING   Incomplete  FUNGUS CULTURE W SMEAR     Status: None   Collection Time    05/13/13 11:33 AM      Result Value Range Status   Specimen Description TISSUE LYMPH NODE   Final   Special Requests 7 NODE PT ON VANCO   Final   Fungal Smear NO YEAST OR FUNGAL ELEMENTS SEEN   Final   Culture CULTURE IN PROGRESS FOR FOUR WEEKS   Final   Report Status PENDING   Incomplete  CULTURE, EXPECTORATED SPUTUM-ASSESSMENT     Status: None   Collection Time    05/15/13  9:17 AM      Result Value Range Status   Specimen Description SPUTUM   Final   Special Requests Normal   Final   Sputum evaluation     Final   Value: THIS SPECIMEN IS ACCEPTABLE. RESPIRATORY CULTURE REPORT TO FOLLOW.   Report Status 05/15/2013 FINAL   Final  CULTURE, RESPIRATORY (NON-EXPECTORATED)     Status: None   Collection Time    05/15/13  9:17 AM      Result Value Range Status   Specimen Description SPUTUM   Final   Special Requests NONE   Final   Gram Stain     Final   Value: NO WBC SEEN     NO SQUAMOUS EPITHELIAL CELLS SEEN     NO ORGANISMS SEEN   Culture NORMAL OROPHARYNGEAL FLORA   Final   Report Status 05/17/2013 FINAL   Final  URINE CULTURE     Status: None   Collection Time    05/15/13 11:53 AM      Result Value Range Status   Specimen  Description URINE, CLEAN CATCH   Final   Special Requests Normal   Final   Culture  Setup Time 05/15/2013 19:16   Final   Colony Count 40,000 COLONIES/ML   Final   Culture YEAST   Final   Report Status 05/16/2013 FINAL   Final  CULTURE, BLOOD (ROUTINE X 2)     Status: None   Collection Time    05/20/13  4:50 AM      Result Value Range Status   Specimen Description BLOOD LEFT ARM   Final   Special Requests BOTTLES DRAWN AEROBIC AND ANAEROBIC 10CC   Final   Culture  Setup Time 05/20/2013 09:35   Final   Culture     Final  Value:        BLOOD CULTURE RECEIVED NO GROWTH TO DATE CULTURE WILL BE HELD FOR 5 DAYS BEFORE ISSUING A FINAL NEGATIVE REPORT   Report Status PENDING   Incomplete  CULTURE, BLOOD (ROUTINE X 2)     Status: None   Collection Time    05/20/13  5:00 AM      Result Value Range Status   Specimen Description BLOOD LEFT HAND   Final   Special Requests BOTTLES DRAWN AEROBIC ONLY 1 CC   Final   Culture  Setup Time 05/20/2013 09:35   Final   Culture     Final   Value:        BLOOD CULTURE RECEIVED NO GROWTH TO DATE CULTURE WILL BE HELD FOR 5 DAYS BEFORE ISSUING A FINAL NEGATIVE REPORT   Report Status PENDING   Incomplete      Studies:  Dg Chest Port 1 View  05/20/2013   *RADIOLOGY REPORT*  Clinical Data: Pneumonia.  Dysrhythmia.  The patient 30 chemotherapy today.  PORTABLE CHEST - 1 VIEW  Comparison: Chest CT 05/27, chest radiograph 05/31.  Findings: Miliary pattern appears similar to the prior exam.  There is no definite superimposed airspace opacity.  Mild cardiomegaly appears similar to the prior exam.  Tracheal air column appears within normal limits.  Compared to the prior CT, the pattern appears little changed.  This miliary pattern may be due to neoplasm although this would be atypical for lymphoma.  Atypical infection is a definite consideration.  IMPRESSION: No interval change compared to recent CT.  Miliary pattern throughout both lungs, which could be due to  hematogenous disease or atypical infection.   Original Report Authenticated By: Andreas Newport, M.D.    Assessment: 52 y.o.with newly diagnosed anaplastic large cell lymphoma high grade with extensive involvement of lungs liver, stage IV. Patient had a mediastinal lymph node biopsy on 05/13/2013. She was started on chemotherapy consisting of Cytoxan Adriamycin vincristine and prednisone and etoposide. She is day 2 tolerating it well. Her shortness of breath has improved significantly. She is sitting up and eating.    Plan:   #1 continue chemotherapy as scheduled.  #2 she will receive Neupogen on Monday.   She is also scheduled to have a Port-A-Cath placed.   Jaleiah Asay

## 2013-05-22 NOTE — Progress Notes (Signed)
[facility] PROGRESS NOTE  Megan Mcknight ZOX:[id number] DOB: 1961-08-07 DOA: 05/06/2013 PCP: [name], MD  Assessment/Plan: Principal Problem:   Shortness of breath Active Problems:   Atrial fibrillation with RVR   Bilateral pneumonia   Acute renal insufficiency   Normocytic anemia   Other malignant lymphomas, unspecified site, extranodal and solid organ sites    1. 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 Per Dr [name], oncologist, chemotherapy started on 05/20/13, and well tolerated, although nauseated today. Managing per oncology. Patient is scheduled for Port-A-Cath placement on Monday, 05/23/13. 2. Bilateral pneumonia: Patient was originally admitted to [facility] by Dr. [name] on 05/06/13 for what was thought to be CAP. Initially treated with Vancomycin/Levaquin. Now on Levaquin monotherapy. She continued to have low grade fevers that could be due to malignancy rather than infection, but has remained afebrile over the last 24 hours, and has only a mild cough, productive of clear phlegm. Blood cultures were repeated on 05/20/13, and have revealed no growth so far. CXR of 05/20/13 showed no interval change compared to recent CT. Clinically, no evidence of active infection at this time..Pneumonia has resolved. Antibiotics discontinued on 05/21/13, after a total of 12 days treatment.  3. Atrial fibrillation with RVR: this was evident in the first few days of hospitaization, in the setting of initial PNA. Managed with Metoprolol, patient reverted to SR, and has had no further recurrence. No clinical evidence of ACS, or heart failure. TSH was WNL and 2D echocardiogram showed preserved EF at 75%. Low [place] score. No need for anticoagulation. Have discontinued telemetry as of 05/20/13.  4. Acute renal insufficiency:  Creatinine at presentation, was 1.54, consistent with AKI. Managed with iv fluids, and creatinine has now normalized. Creatinine had normalized at 0.81 as of 05/20/13.  5. Normocytic anemia: This is mild, likely due to malignancy, and appears reasonable/stable.  6. HTN: BP has been persistently elevated for the past 3 days, although patient has no previous history of HTN. Will start Norvasc 5 mg daily, form today.     Code Status: Full Code.  Family Communication:  Disposition Plan: to be determined. Per Oncology.    Brief narrative: 52 y.o. female with history of Hyperlipidemia, Nerve pain, Seasonal allergies, Colon polyps (02/2013), Diverticulosis (02/2013), Former tobacco smoker and Dysrhythmia, originally admitted to [facility] by Dr. [name] on 05/06/13 for what was thought to be CAP and A.fib w RVR. She was on Dr. [name] service. She was treated for PNA with Levaquin and Vancomycin and her A.fib resolved. Despite clinical improvement, she had worsening infiltrates, Pulmonology consult was called and CT scan was done on 05/10/13, which showed multiple lung and liver nodules. She was transferred to Dr. Sunday Corn service (CT surgery) on 05/11/13 for further evaluation. On 05/13/13, she underwent video bronchoscopy with endobronchial ultrasound and mediastinoscopy. She continued to have fevers post-opeartively and was restarted on empiric antibiotic coverage with Levaquin. 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 Dr. [name] had been seeing her in consult he requested for her to Be transferred to [facility] to start Chemotherapy while inpatient, and have a PET scan done. He anticipates to start on Cytoxan, Adriamycin, Vincristine, Prednisone and Etoposide along with Neupogen/Neulasta regimen. Dr. [name] has requested [facility] to admit patient to our service while at [facility] due to history of  A. Fib.    Consultants:  Dr Orie Rout, oncologist.   Procedures:  See narrative.   Antibiotics:  Levaquin 05/16/13-05/21/13.  HPI/Subjective: Severely nauseated. No vomiting.   Objective: Vital signs in last 24 hours: Temp:  [98.2 F (36.8 C)-98.7 F (37.1 C)] 98.7 F (37.1 C) (06/08 0609) Pulse Rate:  [71-85] 85 (06/08 0609) Resp:  [18] 18 (06/08 0609) BP: (134-166)/(76-90) 166/76 mmHg (06/08 0609) SpO2:  [95 %-98 %] 97 % (06/08 0609) Weight:  [100.517 kg (221 lb 9.6 oz)] 100.517 kg (221 lb 9.6 oz) (06/08 0609) Weight change:  Last BM Date: 05/21/13  Intake/Output from previous day: 06/07 0701 - 06/08 0700 In: 480 [P.O.:480] Out: 400 [Urine:400]     Physical Exam: General: Comfortable, alert, communicative, fully oriented, not short of breath at rest.  HEENT:  Mild clinical pallor, no jaundice, no conjunctival injection or discharge. NECK:  Supple, JVP not seen, no carotid bruits, no palpable lymphadenopathy, no palpable goiter. CHEST:  Clinically clear to auscultation, no wheezes, no crackles. HEART:  Sounds 1 and 2 heard, normal, regular, no murmurs. ABDOMEN:  Moderately obese, soft, non-tender, no palpable organomegaly, no palpable masses, normal bowel sounds. GENITALIA:  Not examined. LOWER EXTREMITIES:  Minimal pitting edema, palpable peripheral pulses. MUSCULOSKELETAL SYSTEM:  unremarkable. CENTRAL NERVOUS SYSTEM:  No focal neurologic deficit on gross examination.  Lab Results:  Recent Labs  05/21/13 0429 05/22/13 0410  WBC 20.9* 12.8*  HGB 8.5* 8.3*  HCT 27.8* 26.4*  PLT 435* 499*    Recent Labs  05/21/13 0429 05/22/13 0410  NA 139 140  K 3.4* 3.7  CL 101 105  CO2 28 27  GLUCOSE 201* 210*  BUN 13 17  CREATININE 0.66 0.81  CALCIUM 8.7 8.8   Recent Results (from the past 240 hour(s))  CULTURE, RESPIRATORY (NON-EXPECTORATED)     Status: None   Collection Time    05/13/13 10:03 AM      Result Value Range Status   Specimen Description BRONCHIAL WASHINGS LEFT   Final    Special Requests PATIENT ON FOLLOWING VANCO   Final   Gram Stain     Final   Value: FEW WBC PRESENT, PREDOMINANTLY PMN     RARE SQUAMOUS EPITHELIAL CELLS PRESENT     NO ORGANISMS SEEN   Culture Non-Pathogenic Oropharyngeal-type Flora Isolated.   Final   Report Status 05/15/2013 FINAL   Final  AFB CULTURE WITH SMEAR     Status: None   Collection Time    05/13/13 10:03 AM      Result Value Range Status   Specimen Description BRONCHIAL WASHINGS LEFT   Final   Special Requests PATIENT ON FOLLOWING VANCO   Final   ACID FAST SMEAR NO ACID FAST BACILLI SEEN   Final   Culture     Final   Value: CULTURE WILL BE EXAMINED FOR 6 WEEKS BEFORE ISSUING A FINAL REPORT   Report Status PENDING   Incomplete  FUNGUS CULTURE W SMEAR     Status: None   Collection Time    05/13/13 10:03 AM      Result Value Range Status   Specimen Description BRONCHIAL WASHINGS LEFT   Final   Special Requests PATIENT ON FOLLOWING VANCO   Final   Fungal Smear NO YEAST OR FUNGAL ELEMENTS SEEN   Final   Culture CANDIDA ALBICANS   Final   Report Status PENDING   Incomplete  TISSUE CULTURE     Status: None   Collection Time  05/13/13 11:21 AM      Result Value Range Status   Specimen Description TISSUE LYMPH NODE   Final   Special Requests 4R PT ON VANCO   Final   Gram Stain     Final   Value: NO WBC SEEN     NO ORGANISMS SEEN   Culture NO GROWTH 3 DAYS   Final   Report Status 05/16/2013 FINAL   Final  AFB CULTURE WITH SMEAR     Status: None   Collection Time    05/13/13 11:21 AM      Result Value Range Status   Specimen Description TISSUE LYMPH NODE   Final   Special Requests NONE 4R PT ON VANCO   Final   ACID FAST SMEAR NO ACID FAST BACILLI SEEN   Final   Culture     Final   Value: CULTURE WILL BE EXAMINED FOR 6 WEEKS BEFORE ISSUING A FINAL REPORT   Report Status PENDING   Incomplete  FUNGUS CULTURE W SMEAR     Status: None   Collection Time    05/13/13 11:21 AM      Result Value Range Status   Specimen  Description TISSUE LYMPH NODE   Final   Special Requests 4R PT ON VANCO   Final   Fungal Smear NO YEAST OR FUNGAL ELEMENTS SEEN   Final   Culture CULTURE IN PROGRESS FOR FOUR WEEKS   Final   Report Status PENDING   Incomplete  TISSUE CULTURE     Status: None   Collection Time    05/13/13 11:33 AM      Result Value Range Status   Specimen Description TISSUE LYMPH NODE   Final   Special Requests 7 NODE PT ON VANCO   Final   Gram Stain     Final   Value: NO WBC SEEN     NO ORGANISMS SEEN   Culture NO GROWTH 3 DAYS   Final   Report Status 05/16/2013 FINAL   Final  AFB CULTURE WITH SMEAR     Status: None   Collection Time    05/13/13 11:33 AM      Result Value Range Status   Specimen Description TISSUE LYMPH NODE   Final   Special Requests 7 NODE PT ON VANCO   Final   ACID FAST SMEAR NO ACID FAST BACILLI SEEN   Final   Culture     Final   Value: CULTURE WILL BE EXAMINED FOR 6 WEEKS BEFORE ISSUING A FINAL REPORT   Report Status PENDING   Incomplete  FUNGUS CULTURE W SMEAR     Status: None   Collection Time    05/13/13 11:33 AM      Result Value Range Status   Specimen Description TISSUE LYMPH NODE   Final   Special Requests 7 NODE PT ON VANCO   Final   Fungal Smear NO YEAST OR FUNGAL ELEMENTS SEEN   Final   Culture CULTURE IN PROGRESS FOR FOUR WEEKS   Final   Report Status PENDING   Incomplete  CULTURE, EXPECTORATED SPUTUM-ASSESSMENT     Status: None   Collection Time    05/15/13  9:17 AM      Result Value Range Status   Specimen Description SPUTUM   Final   Special Requests Normal   Final   Sputum evaluation     Final   Value: THIS SPECIMEN IS ACCEPTABLE. RESPIRATORY CULTURE REPORT TO FOLLOW.   Report Status 05/15/2013 FINAL  Final  CULTURE, RESPIRATORY (NON-EXPECTORATED)     Status: None   Collection Time    05/15/13  9:17 AM      Result Value Range Status   Specimen Description SPUTUM   Final   Special Requests NONE   Final   Gram Stain     Final   Value: NO WBC SEEN      NO SQUAMOUS EPITHELIAL CELLS SEEN     NO ORGANISMS SEEN   Culture NORMAL OROPHARYNGEAL FLORA   Final   Report Status 05/17/2013 FINAL   Final  URINE CULTURE     Status: None   Collection Time    05/15/13 11:53 AM      Result Value Range Status   Specimen Description URINE, CLEAN CATCH   Final   Special Requests Normal   Final   Culture  Setup Time 05/15/2013 19:16   Final   Colony Count 40,000 COLONIES/ML   Final   Culture YEAST   Final   Report Status 05/16/2013 FINAL   Final  CULTURE, BLOOD (ROUTINE X 2)     Status: None   Collection Time    05/20/13  4:50 AM      Result Value Range Status   Specimen Description BLOOD LEFT ARM   Final   Special Requests BOTTLES DRAWN AEROBIC AND ANAEROBIC 10CC   Final   Culture  Setup Time 05/20/2013 09:35   Final   Culture     Final   Value:        BLOOD CULTURE RECEIVED NO GROWTH TO DATE CULTURE WILL BE HELD FOR 5 DAYS BEFORE ISSUING A FINAL NEGATIVE REPORT   Report Status PENDING   Incomplete  CULTURE, BLOOD (ROUTINE X 2)     Status: None   Collection Time    05/20/13  5:00 AM      Result Value Range Status   Specimen Description BLOOD LEFT HAND   Final   Special Requests BOTTLES DRAWN AEROBIC ONLY 1 CC   Final   Culture  Setup Time 05/20/2013 09:35   Final   Culture     Final   Value:        BLOOD CULTURE RECEIVED NO GROWTH TO DATE CULTURE WILL BE HELD FOR 5 DAYS BEFORE ISSUING A FINAL NEGATIVE REPORT   Report Status PENDING   Incomplete     Studies/Results: Dg Chest Port 1 View  05/20/2013   *RADIOLOGY REPORT*  Clinical Data: Pneumonia.  Dysrhythmia.  The patient 30 chemotherapy today.  PORTABLE CHEST - 1 VIEW  Comparison: Chest CT 05/27, chest radiograph 05/31.  Findings: Miliary pattern appears similar to the prior exam.  There is no definite superimposed airspace opacity.  Mild cardiomegaly appears similar to the prior exam.  Tracheal air column appears within normal limits.  Compared to the prior CT, the pattern appears little  changed.  This miliary pattern may be due to neoplasm although this would be atypical for lymphoma.  Atypical infection is a definite consideration.  IMPRESSION: No interval change compared to recent CT.  Miliary pattern throughout both lungs, which could be due to hematogenous disease or atypical infection.   Original Report Authenticated By: Andreas Newport, M.D.    Medications: Scheduled Meds: . allopurinol  300 mg Oral Daily  . atorvastatin  20 mg Oral q1800  . benzonatate  200 mg Oral TID  . enoxaparin  40 mg Subcutaneous Q24H  . etoposide  80 mg/m2 (Treatment Plan Actual) Intravenous Q24H  . [START  ON 05/23/2013] filgrastim  300 mcg Subcutaneous q1800  . hydrocortisone   Rectal BID  . metoprolol tartrate  25 mg Oral TID  . ondansetron (ZOFRAN) IV  8 mg Intravenous Q24H  . pantoprazole (PROTONIX) IV  40 mg Intravenous Q24H  . predniSONE  100 mg Oral Q breakfast  . senna  1 tablet Oral QHS   Continuous Infusions: . sodium chloride     PRN Meds:.acetaminophen, acetaminophen, albuterol, alum & mag hydroxide-simeth, guaiFENesin-dextromethorphan, HYDROcodone-acetaminophen, ibuprofen, levalbuterol, LORazepam, montelukast, ondansetron (ZOFRAN) IVPB, oxyCODONE, prochlorperazine, sodium chloride, sodium chloride, sodium chloride    LOS: 16 days   Adaysha Dubinsky,CHRISTOPHER  [facility] Pager [phone]. If 8PM-8AM, please contact night-coverage at [email or web address], password [facility] 05/22/2013, 8:58 AM  LOS: 16 days

## 2013-05-22 NOTE — Progress Notes (Signed)
  05/22/2013, 8:29 AM  Hospital day: 17 Antibiotics: none Chemotherapy: Day 3 cycle 1 [facility] etoposide.  Neupogen to begin 05-23-13   Subjective: Nausea increased since last pm, continues now despite zofran 8 mg at 0600. Not frank vomiting, some GERD. Not able to eat today, will try sprite. IVF off and will be resumed now. Bowels moved last pm. Breathing still much better than prior to treatment. Slept only intermittently. Not lightheaded when up to BR.  Objective: Vital signs in last 24 hours: Blood pressure 166/76, pulse 85, temperature 98.7 F (37.1 C), temperature source Oral, resp. rate 18, height 5\' 8"  (1.727 m), weight 221 lb 9.6 oz (100.517 kg), SpO2 97.00%.  Will resume IVF with NS at 100/ hr with nausea and inadequate po intake now. Intake/Output from previous day: 06/07 0701 - 06/08 0700 In: 480 [P.O.:480] Out: 400 [Urine:400] Intake/Output this shift:    Physical exam: awake, alert, looks uncomfortable from nausea. Respirations not labored on RA in bed at 30 degrees. PERRL, not icteric. Oral mucosa somewhat dry without lesions. Lungs with breath sounds clear bilaterally, no wheezes or rales. Heart RRR. Abdomen soft, quiet, not discretely tender including epigastrium. LE no pitting edema, cords, tenderness. Moves all extremities easily, speech fluent and appropriate.  Lab Results:  Recent Labs  05/21/13 0429 05/22/13 0410  WBC 20.9* 12.8*  HGB 8.5* 8.3*  HCT 27.8* 26.4*  PLT 435* 499*   BMET  Recent Labs  05/21/13 0429 05/22/13 0410  NA 139 140  K 3.4* 3.7  CL 101 105  CO2 28 27  GLUCOSE 201* 210*  BUN 13 17  CREATININE 0.66 0.81  CALCIUM 8.7 8.8  Remainder of full CMET has alb 2.1, AST 99, ALT 103, AP 133, calculated GFR 83  Studies/Results: Dg Chest Port 1 View  05/20/2013   *RADIOLOGY REPORT*  Clinical Data: Pneumonia.  Dysrhythmia.  The patient 30 chemotherapy today.  PORTABLE CHEST - 1 VIEW  Comparison: Chest CT 05/27, chest radiograph 05/31.  Findings:  Miliary pattern appears similar to the prior exam.  There is no definite superimposed airspace opacity.  Mild cardiomegaly appears similar to the prior exam.  Tracheal air column appears within normal limits.  Compared to the prior CT, the pattern appears little changed.  This miliary pattern may be due to neoplasm although this would be atypical for lymphoma.  Atypical infection is a definite consideration.  IMPRESSION: No interval change compared to recent CT.  Miliary pattern throughout both lungs, which could be due to hematogenous disease or atypical infection.   Original Report Authenticated By: Andreas Newport, M.D.     Assessment/Plan: 1. ALK+ anaplastic large cell lymphoma, stage 4 with extensive involvement of lungs and liver. Completing first chemo, no evidence of tumor lysis thus far. Needs to continue good hydration, IVF resumed now. On allopurinol. Creatinine still good. Will change protonix to IV and try addition of compazine and/or ativan with present zofran. Daily labs, with LDH and uric acid in AM. Not stable for DC. 2.respiratory problems better with initiation of treatment 3.Afib with RVR on admission 4. Mild hyperglycemia on steroids Discussed with RN on unit. Please call between our rounds if needed.  Ayodeji Keimig P [phone]

## 2013-05-22 NOTE — Progress Notes (Signed)
Patient is on D#3 of chemotherapy (will received Etoposide 80 mg/m2 today).  AST/ALT increased to 99/103, guidelines recommended a 50% dose reduction. Patient's current dose is already 20% reduced and pt with liver mets.  Per conversation with Dr. Welton Flakes this morning - continue the same dose of Etoposide despite lab.   Geoffry Paradise, PharmD, BCPS Pager: [phone] 8:41 AM Pharmacy #: [phone]

## 2013-05-23 ENCOUNTER — Encounter (HOSPITAL_COMMUNITY): Payer: Self-pay | Admitting: Radiology

## 2013-05-23 ENCOUNTER — Inpatient Hospital Stay (HOSPITAL_COMMUNITY): Payer: PRIVATE HEALTH INSURANCE

## 2013-05-23 DIAGNOSIS — C8589 Other specified types of non-Hodgkin lymphoma, extranodal and solid organ sites: Secondary | ICD-10-CM

## 2013-05-23 LAB — COMPREHENSIVE METABOLIC PANEL
ALT: 87 U/L — ABNORMAL HIGH (ref 0–35)
AST: 40 U/L — ABNORMAL HIGH (ref 0–37)
Albumin: 2.1 g/dL — ABNORMAL LOW (ref 3.5–5.2)
Alkaline Phosphatase: 113 U/L (ref 39–117)
BUN: 17 mg/dL (ref 6–23)
CO2: 27 mEq/L (ref 19–32)
Calcium: 8.6 mg/dL (ref 8.4–10.5)
Chloride: 106 mEq/L (ref 96–112)
Creatinine, Ser: 0.7 mg/dL (ref 0.50–1.10)
GFR calc Af Amer: >90 mL/min (ref >90)
GFR calc non Af Amer: >90 mL/min (ref >90)
Glucose, Bld: 105 mg/dL — ABNORMAL HIGH (ref 70–99)
Potassium: 3.4 mEq/L — ABNORMAL LOW (ref 3.5–5.1)
Sodium: 140 mEq/L (ref 135–145)
Total Bilirubin: 0.3 mg/dL (ref 0.3–1.2)
Total Protein: 6.3 g/dL (ref 6.0–8.3)

## 2013-05-23 LAB — CBC WITH DIFFERENTIAL/PLATELET
Basophils Absolute: 0 10*3/uL (ref 0.0–0.1)
Basophils Relative: 0 % (ref 0–1)
Eosinophils Absolute: 0 10*3/uL (ref 0.0–0.7)
Eosinophils Relative: 0 % (ref 0–5)
HCT: 27 % — ABNORMAL LOW (ref 36.0–46.0)
Hemoglobin: 8.5 g/dL — ABNORMAL LOW (ref 12.0–15.0)
Lymphocytes Relative: 30 % (ref 12–46)
Lymphs Abs: 2.4 10*3/uL (ref 0.7–4.0)
MCH: 24.8 pg — ABNORMAL LOW (ref 26.0–34.0)
MCHC: 31.5 g/dL (ref 30.0–36.0)
MCV: 78.7 fL (ref 78.0–100.0)
Monocytes Absolute: 0 10*3/uL — ABNORMAL LOW (ref 0.1–1.0)
Monocytes Relative: 0 % — ABNORMAL LOW (ref 3–12)
Neutro Abs: 5.4 10*3/uL (ref 1.7–7.7)
Neutrophils Relative %: 70 % (ref 43–77)
Platelets: 556 10*3/uL — ABNORMAL HIGH (ref 150–400)
RBC: 3.43 MIL/uL — ABNORMAL LOW (ref 3.87–5.11)
RDW: 13.9 % (ref 11.5–15.5)
WBC: 7.8 10*3/uL (ref 4.0–10.5)

## 2013-05-23 LAB — LACTATE DEHYDROGENASE: LDH: 191 U/L (ref 94–250)

## 2013-05-23 LAB — URIC ACID: Uric Acid, Serum: 2.7 mg/dL (ref 2.4–7.0)

## 2013-05-23 LAB — MAGNESIUM: Magnesium: 2.1 mg/dL (ref 1.5–2.5)

## 2013-05-23 MED ORDER — CEFAZOLIN SODIUM-DEXTROSE 2-3 GM-% IV SOLR
2.0000 g | INTRAVENOUS | Status: AC
  Administered 2013-05-23: 2 g via INTRAVENOUS

## 2013-05-23 MED ORDER — POTASSIUM CHLORIDE CRYS ER 20 MEQ PO TBCR
40.0000 meq | EXTENDED_RELEASE_TABLET | Freq: Once | ORAL | Status: AC
  Administered 2013-05-23: 40 meq via ORAL
  Filled 2013-05-23: qty 2

## 2013-05-23 MED ORDER — HEPARIN SOD (PORK) LOCK FLUSH 100 UNIT/ML IV SOLN: 500.0000 [IU] | Freq: Once | INTRAVENOUS | Status: DC

## 2013-05-23 MED ORDER — FENTANYL CITRATE 0.05 MG/ML IJ SOLN
INTRAMUSCULAR | Status: AC | PRN
  Administered 2013-05-23: 100 ug via INTRAVENOUS

## 2013-05-23 MED ORDER — MIDAZOLAM HCL 2 MG/2ML IJ SOLN
INTRAMUSCULAR | Status: AC | PRN
  Administered 2013-05-23: 1 mg via INTRAVENOUS

## 2013-05-23 MED ORDER — ENOXAPARIN SODIUM 40 MG/0.4ML ~~LOC~~ SOLN
40.0000 mg | SUBCUTANEOUS | Status: DC
  Administered 2013-05-24: 40 mg via SUBCUTANEOUS
  Filled 2013-05-23: qty 0.4

## 2013-05-23 MED ORDER — PANTOPRAZOLE SODIUM 40 MG PO TBEC
40.0000 mg | DELAYED_RELEASE_TABLET | Freq: Every day | ORAL | Status: DC
  Administered 2013-05-24: 40 mg via ORAL
  Filled 2013-05-23: qty 1

## 2013-05-23 NOTE — Progress Notes (Signed)
[facility] PROGRESS NOTE  Megan Mcknight ZOX:[id number] DOB: 1961/05/21 DOA: 05/06/2013 PCP: [name], MD  Assessment/Plan: Principal Problem:   Shortness of breath Active Problems:   Atrial fibrillation with RVR   Bilateral pneumonia   Acute renal insufficiency   Normocytic anemia   Other malignant lymphomas, unspecified site, extranodal and solid organ sites    1. 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 Per Dr [name], oncologist, chemotherapy started on 05/20/13, and well tolerated, although nauseated today. Managing per oncology. Patient is scheduled for Port-A-Cath placement on Monday, 05/23/13. 2. Bilateral pneumonia: Patient was originally admitted to [facility] by Dr. [name] on 05/06/13 for what was thought to be CAP. Initially treated with Vancomycin/Levaquin. Now on Levaquin monotherapy. She continued to have low grade fevers that could be due to malignancy rather than infection, but has remained afebrile since 05/20/13, and has only a mild cough, productive of clear phlegm. Blood cultures were repeated on 05/20/13, and have revealed no growth so far. CXR of 05/20/13 showed no interval change compared to recent CT. Clinically, no evidence of active infection at this time..Pneumonia has resolved. Antibiotics discontinued on 05/21/13, after a total of 12 days treatment.  3. Atrial fibrillation with RVR: this was evident in the first few days of hospitaization, in the setting of initial PNA. Managed with Metoprolol, patient reverted to SR, and has had no further recurrence. No clinical evidence of ACS, or heart failure. TSH was WNL and 2D echocardiogram showed preserved EF at 75%. Low [place] score. No need for anticoagulation. Have discontinued telemetry as of 05/20/13.  4. Acute renal insufficiency: Creatinine at  presentation, was 1.54, consistent with AKI. Managed with iv fluids, and creatinine has now normalized. Creatinine had normalized at 0.81 as of 05/20/13.  5. Normocytic anemia: This is moderate, likely due to malignancy, and appears reasonable/stable.  6. HTN: BP was been persistently elevated since 05/20/13, although patient has no previous history of HTN. Commenced on Norvasc 5 mg daily, from 05/22/13, and BP has normalized.     Code Status: Full Code.  Family Communication:  Disposition Plan: to be determined. Per Oncology.    Brief narrative: 52 y.o. female with history of Hyperlipidemia, Nerve pain, Seasonal allergies, Colon polyps (02/2013), Diverticulosis (02/2013), Former tobacco smoker and Dysrhythmia, originally admitted to [facility] by Dr. [name] on 05/06/13 for what was thought to be CAP and A.fib w RVR. She was on Dr. [name] service. She was treated for PNA with Levaquin and Vancomycin and her A.fib resolved. Despite clinical improvement, she had worsening infiltrates, Pulmonology consult was called and CT scan was done on 05/10/13, which showed multiple lung and liver nodules. She was transferred to Dr. Sunday Corn service (CT surgery) on 05/11/13 for further evaluation. On 05/13/13, she underwent video bronchoscopy with endobronchial ultrasound and mediastinoscopy. She continued to have fevers post-opeartively and was restarted on empiric antibiotic coverage with Levaquin. 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 Dr. [name] had been seeing her in consult he requested for her to Be transferred to [facility] to start Chemotherapy while inpatient, and have a PET scan done. He anticipates to start on Cytoxan, Adriamycin, Vincristine, Prednisone and Etoposide along with Neupogen/Neulasta regimen. Dr. [name] has requested [facility] to admit patient to our service while at [facility] due to history of A. Fib.  Consultants:  Dr  Orie Rout, oncologist.   Procedures:  See narrative.   Antibiotics:  Levaquin 05/16/13-05/21/13.  HPI/Subjective: Nausea has resolved, and patient is enjoying breakfast this AM.    Objective: Vital signs in last 24 hours: Temp:  [98.5 F (36.9 C)-98.8 F (37.1 C)] 98.5 F (36.9 C) (06/09 0600) Pulse Rate:  [56-80] 56 (06/09 0600) Resp:  [16-20] 16 (06/09 0600) BP: (133-138)/(71-80) 138/75 mmHg (06/09 0600) SpO2:  [96 %-100 %] 100 % (06/09 0600) Weight:  [102.558 kg (226 lb 1.6 oz)] 102.558 kg (226 lb 1.6 oz) (06/09 0703) Weight change:  Last BM Date: 05/21/13  Intake/Output from previous day: 06/08 0701 - 06/09 0700 In: 120 [P.O.:120] Out: -      Physical Exam: General: Comfortable, alert, communicative, fully oriented, not short of breath at rest.  HEENT:  Mild clinical pallor, no jaundice, no conjunctival injection or discharge. NECK:  Supple, JVP not seen, no carotid bruits, no palpable lymphadenopathy, no palpable goiter. CHEST:  Clinically clear to auscultation, no wheezes, no crackles. HEART:  Sounds 1 and 2 heard, normal, regular, no murmurs. ABDOMEN:  Moderately obese, soft, non-tender, no palpable organomegaly, no palpable masses, normal bowel sounds. GENITALIA:  Not examined. LOWER EXTREMITIES:  No pitting edema, palpable peripheral pulses. MUSCULOSKELETAL SYSTEM:  unremarkable. CENTRAL NERVOUS SYSTEM:  No focal neurologic deficit on gross examination.  Lab Results:  Recent Labs  05/22/13 0410 05/23/13 0655  WBC 12.8* 7.8  HGB 8.3* 8.5*  HCT 26.4* 27.0*  PLT 499* 556*    Recent Labs  05/22/13 0410 05/23/13 0655  NA 140 140  K 3.7 3.4*  CL 105 106  CO2 27 27  GLUCOSE 210* 105*  BUN 17 17  CREATININE 0.81 0.70  CALCIUM 8.8 8.6   Recent Results (from the past 240 hour(s))  CULTURE, RESPIRATORY (NON-EXPECTORATED)     Status: None   Collection Time    05/13/13 10:03 AM      Result Value Range Status   Specimen Description BRONCHIAL  WASHINGS LEFT   Final   Special Requests PATIENT ON FOLLOWING VANCO   Final   Gram Stain     Final   Value: FEW WBC PRESENT, PREDOMINANTLY PMN     RARE SQUAMOUS EPITHELIAL CELLS PRESENT     NO ORGANISMS SEEN   Culture Non-Pathogenic Oropharyngeal-type Flora Isolated.   Final   Report Status 05/15/2013 FINAL   Final  AFB CULTURE WITH SMEAR     Status: None   Collection Time    05/13/13 10:03 AM      Result Value Range Status   Specimen Description BRONCHIAL WASHINGS LEFT   Final   Special Requests PATIENT ON FOLLOWING VANCO   Final   ACID FAST SMEAR NO ACID FAST BACILLI SEEN   Final   Culture     Final   Value: CULTURE WILL BE EXAMINED FOR 6 WEEKS BEFORE ISSUING A FINAL REPORT   Report Status PENDING   Incomplete  FUNGUS CULTURE W SMEAR     Status: None   Collection Time    05/13/13 10:03 AM      Result Value Range Status   Specimen Description BRONCHIAL WASHINGS LEFT   Final   Special Requests PATIENT ON FOLLOWING VANCO   Final   Fungal Smear NO YEAST OR FUNGAL ELEMENTS SEEN   Final   Culture CANDIDA ALBICANS   Final   Report Status PENDING   Incomplete  TISSUE CULTURE     Status: None  Collection Time    05/13/13 11:21 AM      Result Value Range Status   Specimen Description TISSUE LYMPH NODE   Final   Special Requests 4R PT ON VANCO   Final   Gram Stain     Final   Value: NO WBC SEEN     NO ORGANISMS SEEN   Culture NO GROWTH 3 DAYS   Final   Report Status 05/16/2013 FINAL   Final  AFB CULTURE WITH SMEAR     Status: None   Collection Time    05/13/13 11:21 AM      Result Value Range Status   Specimen Description TISSUE LYMPH NODE   Final   Special Requests NONE 4R PT ON VANCO   Final   ACID FAST SMEAR NO ACID FAST BACILLI SEEN   Final   Culture     Final   Value: CULTURE WILL BE EXAMINED FOR 6 WEEKS BEFORE ISSUING A FINAL REPORT   Report Status PENDING   Incomplete  FUNGUS CULTURE W SMEAR     Status: None   Collection Time    05/13/13 11:21 AM      Result Value  Range Status   Specimen Description TISSUE LYMPH NODE   Final   Special Requests 4R PT ON VANCO   Final   Fungal Smear NO YEAST OR FUNGAL ELEMENTS SEEN   Final   Culture CULTURE IN PROGRESS FOR FOUR WEEKS   Final   Report Status PENDING   Incomplete  TISSUE CULTURE     Status: None   Collection Time    05/13/13 11:33 AM      Result Value Range Status   Specimen Description TISSUE LYMPH NODE   Final   Special Requests 7 NODE PT ON VANCO   Final   Gram Stain     Final   Value: NO WBC SEEN     NO ORGANISMS SEEN   Culture NO GROWTH 3 DAYS   Final   Report Status 05/16/2013 FINAL   Final  AFB CULTURE WITH SMEAR     Status: None   Collection Time    05/13/13 11:33 AM      Result Value Range Status   Specimen Description TISSUE LYMPH NODE   Final   Special Requests 7 NODE PT ON VANCO   Final   ACID FAST SMEAR NO ACID FAST BACILLI SEEN   Final   Culture     Final   Value: CULTURE WILL BE EXAMINED FOR 6 WEEKS BEFORE ISSUING A FINAL REPORT   Report Status PENDING   Incomplete  FUNGUS CULTURE W SMEAR     Status: None   Collection Time    05/13/13 11:33 AM      Result Value Range Status   Specimen Description TISSUE LYMPH NODE   Final   Special Requests 7 NODE PT ON VANCO   Final   Fungal Smear NO YEAST OR FUNGAL ELEMENTS SEEN   Final   Culture CULTURE IN PROGRESS FOR FOUR WEEKS   Final   Report Status PENDING   Incomplete  CULTURE, EXPECTORATED SPUTUM-ASSESSMENT     Status: None   Collection Time    05/15/13  9:17 AM      Result Value Range Status   Specimen Description SPUTUM   Final   Special Requests Normal   Final   Sputum evaluation     Final   Value: THIS SPECIMEN IS ACCEPTABLE. RESPIRATORY CULTURE REPORT TO FOLLOW.  Report Status 05/15/2013 FINAL   Final  CULTURE, RESPIRATORY (NON-EXPECTORATED)     Status: None   Collection Time    05/15/13  9:17 AM      Result Value Range Status   Specimen Description SPUTUM   Final   Special Requests NONE   Final   Gram Stain      Final   Value: NO WBC SEEN     NO SQUAMOUS EPITHELIAL CELLS SEEN     NO ORGANISMS SEEN   Culture NORMAL OROPHARYNGEAL FLORA   Final   Report Status 05/17/2013 FINAL   Final  URINE CULTURE     Status: None   Collection Time    05/15/13 11:53 AM      Result Value Range Status   Specimen Description URINE, CLEAN CATCH   Final   Special Requests Normal   Final   Culture  Setup Time 05/15/2013 19:16   Final   Colony Count 40,000 COLONIES/ML   Final   Culture YEAST   Final   Report Status 05/16/2013 FINAL   Final  CULTURE, BLOOD (ROUTINE X 2)     Status: None   Collection Time    05/20/13  4:50 AM      Result Value Range Status   Specimen Description BLOOD LEFT ARM   Final   Special Requests BOTTLES DRAWN AEROBIC AND ANAEROBIC 10CC   Final   Culture  Setup Time 05/20/2013 09:35   Final   Culture     Final   Value:        BLOOD CULTURE RECEIVED NO GROWTH TO DATE CULTURE WILL BE HELD FOR 5 DAYS BEFORE ISSUING A FINAL NEGATIVE REPORT   Report Status PENDING   Incomplete  CULTURE, BLOOD (ROUTINE X 2)     Status: None   Collection Time    05/20/13  5:00 AM      Result Value Range Status   Specimen Description BLOOD LEFT HAND   Final   Special Requests BOTTLES DRAWN AEROBIC ONLY 1 CC   Final   Culture  Setup Time 05/20/2013 09:35   Final   Culture     Final   Value:        BLOOD CULTURE RECEIVED NO GROWTH TO DATE CULTURE WILL BE HELD FOR 5 DAYS BEFORE ISSUING A FINAL NEGATIVE REPORT   Report Status PENDING   Incomplete     Studies/Results: No results found.  Medications: Scheduled Meds: . allopurinol  300 mg Oral Daily  . amLODipine  5 mg Oral Daily  . atorvastatin  20 mg Oral q1800  . benzonatate  200 mg Oral TID  . enoxaparin  40 mg Subcutaneous Q24H  . filgrastim  300 mcg Subcutaneous q1800  . hydrocortisone   Rectal BID  . metoprolol tartrate  25 mg Oral TID  . pantoprazole (PROTONIX) IV  40 mg Intravenous Q24H  . potassium chloride  40 mEq Oral Once  . predniSONE  100 mg  Oral Q breakfast  . senna  1 tablet Oral QHS   Continuous Infusions: . sodium chloride 100 mL/hr at 05/22/13 1512   PRN Meds:.acetaminophen, acetaminophen, albuterol, alum & mag hydroxide-simeth, guaiFENesin-dextromethorphan, HYDROcodone-acetaminophen, ibuprofen, levalbuterol, LORazepam, montelukast, ondansetron (ZOFRAN) IVPB, oxyCODONE, prochlorperazine, sodium chloride, sodium chloride, sodium chloride    LOS: 17 days   Judea Riches,CHRISTOPHER  [facility] Pager [phone]. If 8PM-8AM, please contact night-coverage at [email or web address], password [facility] 05/23/2013, 8:23 AM  LOS: 17 days

## 2013-05-23 NOTE — Procedures (Signed)
Successful placement of right IJ approach port-a-cath with tip at the superior caval atrial junction. The catheter is ready for immediate use. No immediate post procedural complications. 

## 2013-05-23 NOTE — H&P (Signed)
Megan Mcknight is an 52 y.o. female.   Chief Complaint: Hx afib; came to ER for cough; PNA Work up revealed B pulmonary nodules; Mediastinal lymphadenopathy; liver lesions Bronchoscopy 5/30: small lymphocytes Bx mediastinal LAN: large cell lymphoma Had BM biopsy by IR last week also, tolerated well. Spike in fever and WBC, now resolved. IR is asked to proceed with port placement.  Past Medical History  Diagnosis Date  . Hyperlipidemia   . Nerve pain     Left leg  . Seasonal allergies   . Acute bronchitis   . Colon polyps 02/2013    Per colonoscopy  . Diverticulosis 02/2013    Per colonoscopy  . Former light tobacco smoker   . Dysrhythmia     Past Surgical History  Procedure Laterality Date  . Partial hysterectomy    . Bones removed from baby toes    . Colonoscopy  02/16/2012    Procedure: COLONOSCOPY;  Surgeon: Corbin Ade, MD;  Location: [facility]ITE;  Service: Endoscopy;  Laterality: N/A;  1:30  . Abdominal hysterectomy    . Video bronchoscopy with endobronchial ultrasound N/A 05/13/2013    Procedure: VIDEO BRONCHOSCOPY WITH ENDOBRONCHIAL ULTRASOUND;  Surgeon: Loreli Slot, MD;  Location: [facility] OR;  Service: Thoracic;  Laterality: N/A;  . Mediastinoscopy N/A 05/13/2013    Procedure: MEDIASTINOSCOPY;  Surgeon: Loreli Slot, MD;  Location: [facility] OR;  Service: Thoracic;  Laterality: N/A;    Family History  Problem Relation Age of Onset  . Heart defect      family history   . Asthma      family history   . Colon polyps Mother    Social History:  reports that she quit smoking about 4 weeks ago. Her smoking use included Cigarettes. She smoked 0.50 packs per day. She does not have any smokeless tobacco history on file. She reports that  drinks alcohol. She reports that she does not use illicit drugs.  Allergies:  Allergies  Allergen Reactions  . Iodine Anaphylaxis    Told to avoid due to shellfish allergy.  Marland Kitchen Shellfish-Derived Products Anaphylaxis and Swelling    Eyes swelling, scratchy throat, bumps on lips.    Medications Prior to Admission  Medication Sig Dispense Refill  . albuterol (PROVENTIL HFA;VENTOLIN HFA) 108 (90 BASE) MCG/ACT inhaler Inhale 2 puffs into the lungs every 6 (six) hours as needed for wheezing.      Marland Kitchen ibuprofen (ADVIL,MOTRIN) 200 MG tablet Take 400 mg by mouth every 6 (six) hours as needed for pain.      . montelukast (SINGULAIR) 10 MG tablet Take 10 mg by mouth daily as needed (Allergies).       . rosuvastatin (CRESTOR) 10 MG tablet Take 10 mg by mouth daily.        Results for orders placed during the hospital encounter of 05/06/13 (from the past 48 hour(s))  COMPREHENSIVE METABOLIC PANEL     Status: Abnormal   Collection Time    05/22/13  4:10 AM      Result Value Range   Sodium 140  135 - 145 mEq/L   Potassium 3.7  3.5 - 5.1 mEq/L   Chloride 105  96 - 112 mEq/L   CO2 27  19 - 32 mEq/L   Glucose, Bld 210 (*) 70 - 99 mg/dL   BUN 17  6 - 23 mg/dL   Creatinine, Ser 1.30  0.50 - 1.10 mg/dL   Calcium 8.8  8.4 - 86.5 mg/dL  Total Protein 6.6  6.0 - 8.3 g/dL   Albumin 2.1 (*) 3.5 - 5.2 g/dL   AST 99 (*) 0 - 37 U/L   ALT 103 (*) 0 - 35 U/L   Alkaline Phosphatase 133 (*) 39 - 117 U/L   Total Bilirubin 0.2 (*) 0.3 - 1.2 mg/dL   GFR calc non Af Amer 83 (*) >90 mL/min   GFR calc Af Amer >90  >90 mL/min   Comment:            The eGFR has been calculated     using the CKD EPI equation.     This calculation has not been     validated in all clinical     situations.     eGFR's persistently     <90 mL/min signify     possible Chronic Kidney Disease.  CBC     Status: Abnormal   Collection Time    05/22/13  4:10 AM      Result Value Range   WBC 12.8 (*) 4.0 - 10.5 K/uL   RBC 3.36 (*) 3.87 - 5.11 MIL/uL   Hemoglobin 8.3 (*) 12.0 - 15.0 g/dL   HCT 21.3 (*) 08.6 - 57.8 %   MCV 78.6  78.0 - 100.0 fL   MCH 24.7 (*) 26.0 - 34.0 pg   MCHC 31.4  30.0 - 36.0 g/dL   RDW 46.9  62.9 - 52.8 %   Platelets 499 (*) 150 - 400 K/uL   COMPREHENSIVE METABOLIC PANEL     Status: Abnormal   Collection Time    05/23/13  6:55 AM      Result Value Range   Sodium 140  135 - 145 mEq/L   Potassium 3.4 (*) 3.5 - 5.1 mEq/L   Chloride 106  96 - 112 mEq/L   CO2 27  19 - 32 mEq/L   Glucose, Bld 105 (*) 70 - 99 mg/dL   BUN 17  6 - 23 mg/dL   Creatinine, Ser 4.13  0.50 - 1.10 mg/dL   Calcium 8.6  8.4 - 24.4 mg/dL   Total Protein 6.3  6.0 - 8.3 g/dL   Albumin 2.1 (*) 3.5 - 5.2 g/dL   AST 40 (*) 0 - 37 U/L   ALT 87 (*) 0 - 35 U/L   Alkaline Phosphatase 113  39 - 117 U/L   Total Bilirubin 0.3  0.3 - 1.2 mg/dL   GFR calc non Af Amer >90  >90 mL/min   GFR calc Af Amer >90  >90 mL/min   Comment:            The eGFR has been calculated     using the CKD EPI equation.     This calculation has not been     validated in all clinical     situations.     eGFR's persistently     <90 mL/min signify     possible Chronic Kidney Disease.  CBC WITH DIFFERENTIAL     Status: Abnormal   Collection Time    05/23/13  6:55 AM      Result Value Range   WBC 7.8  4.0 - 10.5 K/uL   RBC 3.43 (*) 3.87 - 5.11 MIL/uL   Hemoglobin 8.5 (*) 12.0 - 15.0 g/dL   HCT 01.0 (*) 27.2 - 53.6 %   MCV 78.7  78.0 - 100.0 fL   MCH 24.8 (*) 26.0 - 34.0 pg   MCHC 31.5  30.0 - 36.0 g/dL  RDW 13.9  11.5 - 15.5 %   Platelets 556 (*) 150 - 400 K/uL   Neutrophils Relative % 70  43 - 77 %   Neutro Abs 5.4  1.7 - 7.7 K/uL   Lymphocytes Relative 30  12 - 46 %   Lymphs Abs 2.4  0.7 - 4.0 K/uL   Monocytes Relative 0 (*) 3 - 12 %   Monocytes Absolute 0.0 (*) 0.1 - 1.0 K/uL   Eosinophils Relative 0  0 - 5 %   Eosinophils Absolute 0.0  0.0 - 0.7 K/uL   Basophils Relative 0  0 - 1 %   Basophils Absolute 0.0  0.0 - 0.1 K/uL  LACTATE DEHYDROGENASE     Status: None   Collection Time    05/23/13  6:55 AM      Result Value Range   LDH 191  94 - 250 U/L  URIC ACID     Status: None   Collection Time    05/23/13  6:55 AM      Result Value Range   Uric Acid, Serum 2.7   2.4 - 7.0 mg/dL  MAGNESIUM     Status: None   Collection Time    05/23/13  6:55 AM      Result Value Range   Magnesium 2.1  1.5 - 2.5 mg/dL    Review of Systems  Constitutional: Negative for fever.    Blood pressure 138/75, pulse 56, temperature 98.5 F (36.9 C), temperature source Oral, resp. rate 16, height 5\' 8"  (1.727 m), weight 226 lb 1.6 oz (102.558 kg), SpO2 100.00%.  Physical Exam  Constitutional: She is oriented to person, place, and time. She appears well-developed and well-nourished.  Cardiovascular:  No murmur heard. Irreg  Respiratory: Effort normal and breath sounds normal. She has no wheezes.  GI: Soft. Bowel sounds are normal. There is no tenderness.  Musculoskeletal: Normal range of motion.  Neurological: She is alert and oriented to person, place, and time.  Psychiatric: She has a normal mood and affect. Her behavior is normal. Judgment and thought content normal.     Assessment/Plan Large cell lymphoma - mediastinal LAN B pulm nodules; liver lesions Discussed port placement including risks, complications, use of sedation Labs reviewed. Consent signed and in chart Held Lovenox today  Brayton El 05/23/2013, 11:41 AM

## 2013-05-23 NOTE — Progress Notes (Signed)
PROBLEM LIST: 1. 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 Diagnostic tissue was obtained from mediastinal lymph nodes on 05/13/2013. Bone marrow carried out on 05/18/2013 was negative.. IPI score is 2. This is day 4 of cycle #1 following treatment with Cytoxan, Adriamycin, vincristine, prednisone and etoposide. Neupogen was given today and will be given tomorrow. Anticipated response rates are in the 80% range with an anticipated 5 year survival rate of about 50%. The patient may be ready for discharge on 05/24/2013.   2. Respiratory insufficiency due to #1.   3. Episode of atrial fibrillation with rapid ventricular response on admission.   4. Dyslipidemia.   5. Seasonal allergies.   6. Diverticulosis.   7. Status post hysterectomy.   8. Full CODE STATUS.   HISTORY: The patient appears to have done well over the weekend following her chemotherapy. She did have some nausea without vomiting. Today she says that her breathing is improved with less dyspnea on exertion. She was not on oxygen this morning. Fever seems to have resolved. Blood cultures obtained on 05/20/2013 are without growth.   The patient is  receiving daily Neupogen 300 mcg subcutaneous. She is due to complete a five-day course of prednisone 100 mg daily on 05/24/2013.   On physical exam, I hear a few rales on the right. Left lung sounds clear.   Labs today are notable for a hemoglobin of 8.5, potassium 3.4 and albumin 2.1. There is no biochemical evidence for tumor lysis syndrome.   A right-sided Port-A-Cath was placed today.   Following discharge, the patient will need a PET scan to be done as an outpatient. The day following discharge she will need Neulasta 6 mg subcutaneous. We will try to arrange for Neulasta at one of the hospitals closer to where she lives ([place]). We will also be following blood counts closely between now and the  time of her next treatment. She will be due for her second cycle of chemotherapy around 06/10/2013.  Case was discussed with Dr. Brien Few.   Samul Dada 05/23/2013 9:37 PM

## 2013-05-23 NOTE — Progress Notes (Signed)
This patient is receiving IV Protonix. Based on criteria approved by the Pharmacy and Therapeutics Committee, this medication is being converted to the equivalent oral dose form. These criteria include:   . The patient is eating (either orally or per tube) and/or has been taking other orally administered medications for at least 24 hours.  . This patient has no evidence of active gastrointestinal bleeding or impaired GI absorption (gastrectomy, short bowel, patient on TNA or NPO).   If you have questions about this conversion, please contact the pharmacy department.  Clydene Fake, [facility] 05/23/2013 3:16 PM

## 2013-05-24 ENCOUNTER — Telehealth: Payer: Self-pay | Admitting: Medical Oncology

## 2013-05-24 ENCOUNTER — Other Ambulatory Visit (HOSPITAL_COMMUNITY): Payer: PRIVATE HEALTH INSURANCE

## 2013-05-24 ENCOUNTER — Other Ambulatory Visit: Payer: Self-pay | Admitting: Oncology

## 2013-05-24 ENCOUNTER — Other Ambulatory Visit: Payer: Self-pay | Admitting: Medical Oncology

## 2013-05-24 DIAGNOSIS — C8589 Other specified types of non-Hodgkin lymphoma, extranodal and solid organ sites: Secondary | ICD-10-CM

## 2013-05-24 LAB — COMPREHENSIVE METABOLIC PANEL
ALT: 75 U/L — ABNORMAL HIGH (ref 0–35)
AST: 25 U/L (ref 0–37)
Albumin: 2 g/dL — ABNORMAL LOW (ref 3.5–5.2)
Alkaline Phosphatase: 96 U/L (ref 39–117)
BUN: 12 mg/dL (ref 6–23)
CO2: 27 mEq/L (ref 19–32)
Calcium: 8.2 mg/dL — ABNORMAL LOW (ref 8.4–10.5)
Chloride: 106 mEq/L (ref 96–112)
Creatinine, Ser: 0.57 mg/dL (ref 0.50–1.10)
GFR calc Af Amer: >90 mL/min (ref >90)
GFR calc non Af Amer: >90 mL/min (ref >90)
Glucose, Bld: 199 mg/dL — ABNORMAL HIGH (ref 70–99)
Potassium: 3.5 mEq/L (ref 3.5–5.1)
Sodium: 138 mEq/L (ref 135–145)
Total Bilirubin: 0.3 mg/dL (ref 0.3–1.2)
Total Protein: 5.6 g/dL — ABNORMAL LOW (ref 6.0–8.3)

## 2013-05-24 LAB — CBC
HCT: 24.1 % — ABNORMAL LOW (ref 36.0–46.0)
Hemoglobin: 7.8 g/dL — ABNORMAL LOW (ref 12.0–15.0)
MCH: 25.3 pg — ABNORMAL LOW (ref 26.0–34.0)
MCHC: 32.4 g/dL (ref 30.0–36.0)
MCV: 78.2 fL (ref 78.0–100.0)
Platelets: 485 10*3/uL — ABNORMAL HIGH (ref 150–400)
RBC: 3.08 MIL/uL — ABNORMAL LOW (ref 3.87–5.11)
RDW: 13.8 % (ref 11.5–15.5)
WBC: 24.6 10*3/uL — ABNORMAL HIGH (ref 4.0–10.5)

## 2013-05-24 LAB — MAGNESIUM: Magnesium: 2 mg/dL (ref 1.5–2.5)

## 2013-05-24 MED ORDER — ALLOPURINOL 300 MG PO TABS: 300.0000 mg | ORAL_TABLET | Freq: Every day | ORAL | Status: DC

## 2013-05-24 MED ORDER — AMLODIPINE BESYLATE 5 MG PO TABS: 5.0000 mg | ORAL_TABLET | Freq: Every day | ORAL | Status: DC

## 2013-05-24 MED ORDER — PREDNISONE 50 MG PO TABS: ORAL_TABLET | ORAL | Status: DC

## 2013-05-24 MED ORDER — HYDROCORTISONE 2.5 % RE CREA: TOPICAL_CREAM | Freq: Two times a day (BID) | RECTAL | Status: DC

## 2013-05-24 MED ORDER — PANTOPRAZOLE SODIUM 40 MG PO TBEC: 40.0000 mg | DELAYED_RELEASE_TABLET | Freq: Every day | ORAL | Status: DC

## 2013-05-24 MED ORDER — GUAIFENESIN-DM 100-10 MG/5ML PO SYRP: 5.0000 mL | ORAL_SOLUTION | ORAL | Status: DC | PRN

## 2013-05-24 MED ORDER — HEPARIN SOD (PORK) LOCK FLUSH 100 UNIT/ML IV SOLN
INTRAVENOUS | Status: AC
  Filled 2013-05-24: qty 5

## 2013-05-24 MED ORDER — OXYCODONE HCL 5 MG PO TABS: 5.0000 mg | ORAL_TABLET | ORAL | Status: DC | PRN

## 2013-05-24 MED ORDER — SENNA 8.6 MG PO TABS: 1.0000 | ORAL_TABLET | Freq: Every day | ORAL | Status: DC

## 2013-05-24 MED ORDER — METOPROLOL TARTRATE 25 MG PO TABS: 25.0000 mg | ORAL_TABLET | Freq: Three times a day (TID) | ORAL | Status: DC

## 2013-05-24 NOTE — Discharge Summary (Signed)
Physician Discharge Summary  Megan Mcknight:[id number] DOB: [birth year] DOA: 05/06/2013  PCP: [name], MD  Admit date: 05/06/2013 Discharge date: 05/24/2013  Time spent: 40 minutes  Recommendations for Outpatient Follow-up:  1. Follow up with PMD. 2. Follow up with Dr Johney Frame, Hematologist-Oncologist.  3. For outpatient PET scan on 05/30/13.   4. Arrangement s to be made by onclogist, for administration of Neulasta.   Discharge Diagnoses:  Principal Problem:   Shortness of breath Active Problems:   Atrial fibrillation with RVR   Bilateral pneumonia   Acute renal insufficiency   Normocytic anemia   Other malignant lymphomas, unspecified site, extranodal and solid organ sites   Discharge Condition: Satisfactory.   Diet recommendation: Regular.   Filed Weights   05/20/13 0609 05/22/13 0609 05/23/13 0703  Weight: 98.2 kg (216 lb 7.9 oz) 100.517 kg (221 lb 9.6 oz) 102.558 kg (226 lb 1.6 oz)    History of present illness:  52 y.o. female with history of Hyperlipidemia, Nerve pain, Seasonal allergies, Colon polyps (02/2013), Diverticulosis (02/2013), Former tobacco smoker and Dysrhythmia, originally admitted to [facility] by Dr. [name] on 05/06/13 for what was thought to be CAP and A.fib w RVR. She was on Dr. [name] service. She was treated for PNA with Levaquin and Vancomycin and her A.fib resolved. Despite clinical improvement, she had worsening infiltrates, Pulmonology consult was called and CT scan was done on 05/10/13, which showed multiple lung and liver nodules. She was transferred to Dr. Sunday Corn service (CT surgery) on 05/11/13 for further evaluation. On 05/13/13, she underwent video bronchoscopy with endobronchial ultrasound and mediastinoscopy. She continued to have fevers post-opeartively and was restarted on empiric antibiotic coverage with Levaquin. 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  Dr. [name] had been seeing her in consult he requested for her to Be transferred to [facility] to start Chemotherapy while inpatient, and have a PET scan done. He anticipates to start on Cytoxan, Adriamycin, Vincristine, Prednisone and Etoposide along with Neupogen/Neulasta regimen. Dr. [name] has requested [facility] Hospitalist to admit patient to our service while at [facility] due to history of A. Fib.    Hospital Course:  1. 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 Per Dr [name], oncologist, chemotherapy was started on 05/20/13, and involved Cytoxan, Adriamycin, Vincristine, Prednisone and Etoposide. Neupogen was given on 05/23/13 and was 05/24/13. Patient tolerated treatment well apart from nausea on 05/22/13, which had resolved as of 05/23/13.   2. Bilateral pneumonia: Patient was originally admitted to [facility] by Dr. [name] on 05/06/13 for what was thought to be CAP. Initially treated with Vancomycin/Levaquin, then transitioned to Levaquin monotherapy. She continued to have low grade fevers that could be due to malignancy rather than infection, but remained afebrile since 05/20/13, with only a mild cough, productive of clear phlegm. Blood cultures were repeated on 05/20/13, and have revealed no growth so far. CXR of 05/20/13 showed no interval change compared to recent CT. Clinically, no evidence of active infection at this time, and pneumonia has resolved. Antibiotics were discontinued on 05/21/13, after a total of 12 days treatment.  3. Atrial fibrillation with RVR: this was evident in the first few days of hospitalization, in the setting of initial PNA. Managed with Metoprolol, patient reverted to SR, and has had no further recurrence. No clinical evidence  of ACS, or heart failure. TSH was WNL and 2D echocardiogram showed preserved EF at 75%. Low [place]  score. No need for anticoagulation. Telemetry was was discontinued on 05/20/13.  4. Acute renal insufficiency: Creatinine at presentation, was 1.54, consistent with AKI. Managed with iv fluids, and creatinine has now normalized. Creatinine had normalized at 0.81 as of 05/20/13.  5. Normocytic anemia: This is moderate, likely due to malignancy, and appears reasonable/stable.  6. HTN: BP was persistently elevated since 05/20/13, although patient has no previous history of HTN. Commenced on Norvasc 5 mg daily, from 05/22/13, and BP has normalized.     Procedures:  See Below.  Port-A-Cath placement on 05/23/13.   Consultations:  Dr Dietrich Pates, cardiologist.  Dr Dorris Fetch, cardiothoracic surgeon.  Dr Johney Frame, hematologist-oncologist.   Discharge Exam: Filed Vitals:   05/23/13 1615 05/23/13 1645 05/23/13 2052 05/24/13 0453  BP: 142/78 148/79 142/80 135/68  Pulse: 68 74 67 66  Temp: 97.8 F (36.6 C) 98.3 F (36.8 C) 98.6 F (37 C) 98.2 F (36.8 C)  TempSrc: Oral Oral Oral Oral  Resp: 16 18 18 18   Height:      Weight:      SpO2: 100% 100% 100% 99%    General: Comfortable, alert, communicative, fully oriented, not short of breath at rest.  HEENT: Mild clinical pallor, no jaundice, no conjunctival injection or discharge.  NECK: Supple, JVP not seen, no carotid bruits, no palpable lymphadenopathy, no palpable goiter.  CHEST: Clinically clear to auscultation, no wheezes, no crackles.  HEART: Sounds 1 and 2 heard, normal, regular, no murmurs.  ABDOMEN: Moderately obese, soft, non-tender, no palpable organomegaly, no palpable masses, normal bowel sounds.  GENITALIA: Not examined.  LOWER EXTREMITIES: No pitting edema, palpable peripheral pulses.  MUSCULOSKELETAL SYSTEM: unremarkable.  CENTRAL NERVOUS SYSTEM: No focal neurologic deficit on gross examination.  Discharge Instructions      Discharge Orders   Future Appointments Provider Department Dept Phone   05/30/2013 10:00 AM [facility]  [facility] [phone]   Pt should arrive15 minutes prior to scheduled appt time. Please inform patient that exam will take a minimum of 1 1/2 hours. Patient to be NPO 6 hours prior to exam  and should not take any insulin the day of exam.   Future Orders Complete By Expires     Diet general  As directed     Increase activity slowly  As directed     TREATMENT CONDITIONS  As directed     Comments:      Notify the MD for the following lab values: ANC < 1500, PLT < 100K, Hemoglobin < 8.5, Creatinine > 1.5, urine output < 200 ml prior to cisplatin.  If labs are abnormal OR no lab data is available, MD must be notified and order obtained to begin chemotherapy.        Medication List    TAKE these medications       albuterol 108 (90 BASE) MCG/ACT inhaler  Commonly known as:  PROVENTIL HFA;VENTOLIN HFA  Inhale 2 puffs into the lungs every 6 (six) hours as needed for wheezing.     allopurinol 300 MG tablet  Commonly known as:  ZYLOPRIM  Take 1 tablet (300 mg total) by mouth daily.     amLODipine 5 MG tablet  Commonly known as:  NORVASC  Take 1 tablet (5 mg total) by mouth daily.     guaiFENesin-dextromethorphan 100-10 MG/5ML syrup  Commonly known as:  ROBITUSSIN DM  Take 5 mLs by mouth every 4 (four) hours as needed for cough.     hydrocortisone 2.5 % rectal cream  Commonly known as:  ANUSOL-HC  Place rectally 2 (two) times daily.     ibuprofen 200 MG tablet  Commonly known as:  ADVIL,MOTRIN  Take 400 mg by mouth every 6 (six) hours as needed for pain.     metoprolol tartrate 25 MG tablet  Commonly known as:  LOPRESSOR  Take 1 tablet (25 mg total) by mouth 3 (three) times daily.     montelukast 10 MG tablet  Commonly known as:  SINGULAIR  Take 10 mg by mouth daily as needed (Allergies).     oxyCODONE 5 MG immediate release tablet  Commonly known as:  Oxy IR/ROXICODONE  Take 1 tablet (5 mg total) by mouth every 4 (four) hours as  needed.     pantoprazole 40 MG tablet  Commonly known as:  PROTONIX  Take 1 tablet (40 mg total) by mouth daily.     predniSONE 50 MG tablet  Commonly known as:  DELTASONE  Take 100 mg daily for 2 days only.     rosuvastatin 10 MG tablet  Commonly known as:  CRESTOR  Take 10 mg by mouth daily.     senna 8.6 MG Tabs  Commonly known as:  SENOKOT  Take 1 tablet (8.6 mg total) by mouth at bedtime.       Allergies  Allergen Reactions  . Iodine Anaphylaxis    Told to avoid due to shellfish allergy.  Marland Kitchen Shellfish-Derived Products Anaphylaxis and Swelling    Eyes swelling, scratchy throat, bumps on lips.   Follow-up Information   Follow up with [facility], MD. (As needed)    Contact information:   [address] [place] [address] [phone]       Schedule an appointment as soon as possible for a visit with Samul Dada, MD.   Contact information:   [address] [place] [address] [phone]        The results of significant diagnostics from this hospitalization (including imaging, microbiology, ancillary and laboratory) are listed below for reference.    Significant Diagnostic Studies: Ct Chest Wo Contrast  05/10/2013   *RADIOLOGY REPORT*  Clinical Data: Abnormal chest radiograph  CT CHEST WITHOUT CONTRAST  Technique:  Multidetector CT imaging of the chest was performed following the standard protocol without IV contrast.  Comparison: None  Findings: Lungs/pleura: Small pleural effusions are identified.   Innumerable pulmonary nodules are identified throughout both lungs.  Some of these nodules are coalescent and others are clustered.  Index nodule within the right upper lobe measures 1 cm, image number 19/series 3.  Index nodule within the left upper lobe measures 1 cm, image 26/series 3.  Right middle lobe nodule measures 1.2 cm. Airspace consolidation and atelectasis is noted within the posterior lung bases posteriorly.  Heart/Mediastinum:  Multiple enlarged mediastinal lymph nodes are identified bilaterally.  Index prevascular lymph node measures 1.5 cm, image 20/series 2.  Index subcarinal lymph node measures 2.1 cm, image 25/series 2. At the thoracic inlet there is a right sided lymph node measuring 1 cm.  The heart is mildly enlarged.  There is no pericardial effusion.  Upper abdomen: Incidental imaging through the upper abdomen shows multi focal areas of low attenuation within the liver parenchyma. Index lesion along the anterior right hepatic lobe measures 3.2 cm, image 45/series 2.  Left hepatic lobe lesion measures 3.6 cm, image 49/series 2.  The adrenal glands are both of the adrenal glands are grossly unremarkable.  Bones/Musculoskeletal:  No aggressive lytic or sclerotic bone lesions identified.  IMPRESSION:  1.  Diffuse pulmonary nodules are worrisome for metastatic disease. 2. Enlarged bilateral mediastinal lymph nodes are concerning for metastatic adenopathy. 3.  Multi focal low attenuation lesions within the liver are concerning for metastatic disease.   Original Report Authenticated By: Signa Kell, M.D.   Ir Fluoro Guide Cv Line Right  05/23/2013   *RADIOLOGY REPORT*  Indication: Newly-diagnosed lymphoma, indeed intravenous access for chemotherapy administration  IMPLANTED PORT A CATH PLACEMENT WITH ULTRASOUND AND FLUOROSCOPIC GUIDANCE  Comparison: Chest CT - 05/10/2013  Sedation: Versed 1 mg IV; Fentanyl 100 mcg IV; Ancef 5 gm IV; IV antibiotic was given in an appropriate time interval prior to skin puncture.  Total Moderate Sedation Time: 35 minutes.  Contrast: None  Fluoroscopy Time: 30 seconds.  Complications: None immediate  Procedure:  The procedure, risks, benefits, and alternatives were explained to the patient.  Questions regarding the procedure were encouraged and answered.  The patient understands and consents to the procedure.  The right neck and chest were prepped with chlorhexidine in a sterile fashion, and a sterile  drape was applied covering the operative field.  Maximum barrier sterile technique with sterile gowns and gloves were used for the procedure.  A timeout was performed prior to the initiation of the procedure.  Local anesthesia was provided with 1% lidocaine with epinephrine.  After creating a small venotomy incision, a micropuncture kit was utilized to access the right internal jugular vein under direct, real-time ultrasound guidance.  Ultrasound image documentation was performed.  The microwire was kinked to measure appropriate catheter length.  A subcutaneous port pocket was then created along the upper chest wall utilizing a combination of sharp and blunt dissection.  The pocket was irrigated with sterile saline.  A single lumen ISP power injectable port was chosen for placement.  The 8 Fr catheter was tunneled from the port pocket site to the venotomy incision.  The port was placed in the pocket.  The external catheter was trimmed to appropriate length.  At the venotomy, an 8 Fr peel-away sheath was placed over a guidewire under fluoroscopic guidance.  The catheter was then placed through the sheath and the sheath was removed.  Final catheter positioning was confirmed and documented with a fluoroscopic spot radiograph.  The port was accessed with a Huber needle, aspirated and flushed with heparinized saline.  The venotomy site was closed with an interrupted 4-0 Vicryl suture. The port pocket incision was closed with interrupted 2-0 Vicryl suture and the skin was opposed with a running subcuticular 4-0 Vicryl suture.  Dermabond and Steri-strips were applied to both incisions.  Dressings were placed.  The patient tolerated the procedure well without immediate post procedural complication.  Findings:  After catheter placement, the tip lies at the superior cavoatrial junction.  The catheter aspirates and flushes normally and is ready for immediate use.  IMPRESSION:  Successful placement of a right internal jugular  approach single lumen power injectable Port-A-Cath.  The catheter is ready for immediate use.   Original Report Authenticated By: Tacey Ruiz, MD   Ir US Guide Vasc Access Right  05/23/2013   *RADIOLOGY REPORT*  Indication: Newly-diagnosed lymphoma, indeed intravenous access for chemotherapy administration  IMPLANTED PORT A CATH PLACEMENT WITH ULTRASOUND AND FLUOROSCOPIC GUIDANCE  Comparison: Chest CT - 05/10/2013  Sedation: Versed 1 mg IV; Fentanyl 100 mcg IV; Ancef 5  gm IV; IV antibiotic was given in an appropriate time interval prior to skin puncture.  Total Moderate Sedation Time: 35 minutes.  Contrast: None  Fluoroscopy Time: 30 seconds.  Complications: None immediate  Procedure:  The procedure, risks, benefits, and alternatives were explained to the patient.  Questions regarding the procedure were encouraged and answered.  The patient understands and consents to the procedure.  The right neck and chest were prepped with chlorhexidine in a sterile fashion, and a sterile drape was applied covering the operative field.  Maximum barrier sterile technique with sterile gowns and gloves were used for the procedure.  A timeout was performed prior to the initiation of the procedure.  Local anesthesia was provided with 1% lidocaine with epinephrine.  After creating a small venotomy incision, a micropuncture kit was utilized to access the right internal jugular vein under direct, real-time ultrasound guidance.  Ultrasound image documentation was performed.  The microwire was kinked to measure appropriate catheter length.  A subcutaneous port pocket was then created along the upper chest wall utilizing a combination of sharp and blunt dissection.  The pocket was irrigated with sterile saline.  A single lumen ISP power injectable port was chosen for placement.  The 8 Fr catheter was tunneled from the port pocket site to the venotomy incision.  The port was placed in the pocket.  The external catheter was trimmed to  appropriate length.  At the venotomy, an 8 Fr peel-away sheath was placed over a guidewire under fluoroscopic guidance.  The catheter was then placed through the sheath and the sheath was removed.  Final catheter positioning was confirmed and documented with a fluoroscopic spot radiograph.  The port was accessed with a Huber needle, aspirated and flushed with heparinized saline.  The venotomy site was closed with an interrupted 4-0 Vicryl suture. The port pocket incision was closed with interrupted 2-0 Vicryl suture and the skin was opposed with a running subcuticular 4-0 Vicryl suture.  Dermabond and Steri-strips were applied to both incisions.  Dressings were placed.  The patient tolerated the procedure well without immediate post procedural complication.  Findings:  After catheter placement, the tip lies at the superior cavoatrial junction.  The catheter aspirates and flushes normally and is ready for immediate use.  IMPRESSION:  Successful placement of a right internal jugular approach single lumen power injectable Port-A-Cath.  The catheter is ready for immediate use.   Original Report Authenticated By: Tacey Ruiz, MD   Ct Biopsy  05/18/2013   *RADIOLOGY REPORT*  Clinical data: Anemia, lymphoma  CT GUIDED LEFT DEEP ILIAC BONE ASPIRATION AND CORE BIOPSY:  Technique: The procedure, risks (including but not limited to bleeding, infection, organ damage), benefits, and alternatives were explained to the patient.  Questions regarding the procedure were encouraged and answered.  The patient understands and consents to the procedure. Patient was placed supine on the CT gantry and limited axial scans through the pelvis were obtained. Appropriate skin entry site was identified. Skin site was marked, prepped with Betadine,   draped in usual sterile fashion, and infiltrated locally with 1% lidocaine.  Intravenous Fentanyl and Versed were administered as conscious sedation during continuous cardiorespiratory monitoring  by the radiology RN, with a total moderate sedation time of eight minutes.  Under CT fluoroscopic guidance an 11-gauge Cook trocar bone needle was advanced into the  iliac bone just lateral to the sacroiliac joint. Once needle tip position was confirmed, coaxial core and aspiration samples were obtained. The final sample was  obtained using the guiding needle itself, which was then removed. Postprocedure scans show no hematoma or fracture. Patient tolerated procedure well, with no immediate complication.  IMPRESSION:  1. Technically successful CT guided  iliac bone core and aspiration biopsy.   Original Report Authenticated By: D. Andria Rhein, MD   Dg Chest Port 1 View  05/20/2013   *RADIOLOGY REPORT*  Clinical Data: Pneumonia.  Dysrhythmia.  The patient 30 chemotherapy today.  PORTABLE CHEST - 1 VIEW  Comparison: Chest CT 05/27, chest radiograph 05/31.  Findings: Miliary pattern appears similar to the prior exam.  There is no definite superimposed airspace opacity.  Mild cardiomegaly appears similar to the prior exam.  Tracheal air column appears within normal limits.  Compared to the prior CT, the pattern appears little changed.  This miliary pattern may be due to neoplasm although this would be atypical for lymphoma.  Atypical infection is a definite consideration.  IMPRESSION: No interval change compared to recent CT.  Miliary pattern throughout both lungs, which could be due to hematogenous disease or atypical infection.   Original Report Authenticated By: Andreas Newport, M.D.   Dg Chest Port 1 View  05/14/2013   *RADIOLOGY REPORT*  Clinical Data: Bilateral pulmonary nodules, status post bronchoscopy  PORTABLE CHEST - 1 VIEW  Comparison: 05/13/2013  Findings: Diffuse tiny pulmonary nodules bilaterally.  Patchy left lower lobe opacity atelectasis versus pneumonia.  No pleural effusion or pneumothorax.  Cardiomegaly.  IMPRESSION: Diffuse tiny pulmonary nodules bilaterally.  Patchy left lower lobe opacity,  atelectasis versus pneumonia.   Original Report Authenticated By: Charline Bills, M.D.   Dg Chest Port 1 View  05/13/2013   *RADIOLOGY REPORT*  Clinical Data: Status post bronchoscopy, shortness of breath  PORTABLE CHEST - 1 VIEW  Comparison: 05/10/2013  Findings: Diffuse bilateral innumerable pulmonary nodules/nodular opacities.  Mild cardiac enlargement.  Trachea remains midline.  No pneumothorax following bronchoscopy.  IMPRESSION: Extensive diffuse pulmonary nodules/nodular opacities.  No pneumothorax   Original Report Authenticated By: Judie Petit. Miles Costain, M.D.   Dg Chest Port 1 View  05/09/2013   *RADIOLOGY REPORT*  Clinical Data: Follow-up edema/pneumonia pattern.  PORTABLE CHEST - 1 VIEW  Comparison: 05/06/2013 and 03/09/2013  Findings: Artifact overlies chest.  Heart size remains at the upper limits of normal.  Mediastinal shadows are normal.  Widespread bilateral pulmonary densities with a nodular pattern remain evident.  More pronounced density in both lower lobes is present with some volume loss.  The lungs are clear and late March.  The pattern is most worrisome for atypical infection.  This would be an unusual manifestation of edema.  Worsening density in the lower lobes could be due to worsening alveolar consolidation or volume loss.  IMPRESSION: Diffuse micronodular pattern.  Worsening density at the lung bases. Primary concern is that of atypical infection with worsening volume loss at the lung bases.  The pattern would be unusual for edema.   Original Report Authenticated By: Paulina Fusi, M.D.   Dg Chest Port 1 View  05/06/2013   *RADIOLOGY REPORT*  Clinical Data: Shortness of breath, acute bronchitis  PORTABLE CHEST - 1 VIEW  Comparison: 03/09/2013  Findings: Diffuse reticulonodular opacities bilaterally, possibly reflecting mild to moderate interstitial edema or atypical infection. No pleural effusion or pneumothorax.  Mild cardiomegaly.  IMPRESSION: Diffuse reticulonodular opacities bilaterally,  possibly reflecting mild to moderate interstitial edema or atypical infection.   Original Report Authenticated By: Charline Bills, M.D.    Microbiology: Recent Results (from the past 240 hour(s))  CULTURE, EXPECTORATED  SPUTUM-ASSESSMENT     Status: None   Collection Time    05/15/13  9:17 AM      Result Value Range Status   Specimen Description SPUTUM   Final   Special Requests Normal   Final   Sputum evaluation     Final   Value: THIS SPECIMEN IS ACCEPTABLE. RESPIRATORY CULTURE REPORT TO FOLLOW.   Report Status 05/15/2013 FINAL   Final  CULTURE, RESPIRATORY (NON-EXPECTORATED)     Status: None   Collection Time    05/15/13  9:17 AM      Result Value Range Status   Specimen Description SPUTUM   Final   Special Requests NONE   Final   Gram Stain     Final   Value: NO WBC SEEN     NO SQUAMOUS EPITHELIAL CELLS SEEN     NO ORGANISMS SEEN   Culture NORMAL OROPHARYNGEAL FLORA   Final   Report Status 05/17/2013 FINAL   Final  URINE CULTURE     Status: None   Collection Time    05/15/13 11:53 AM      Result Value Range Status   Specimen Description URINE, CLEAN CATCH   Final   Special Requests Normal   Final   Culture  Setup Time 05/15/2013 19:16   Final   Colony Count 40,000 COLONIES/ML   Final   Culture YEAST   Final   Report Status 05/16/2013 FINAL   Final  CULTURE, BLOOD (ROUTINE X 2)     Status: None   Collection Time    05/20/13  4:50 AM      Result Value Range Status   Specimen Description BLOOD LEFT ARM   Final   Special Requests BOTTLES DRAWN AEROBIC AND ANAEROBIC 10CC   Final   Culture  Setup Time 05/20/2013 09:35   Final   Culture     Final   Value:        BLOOD CULTURE RECEIVED NO GROWTH TO DATE CULTURE WILL BE HELD FOR 5 DAYS BEFORE ISSUING A FINAL NEGATIVE REPORT   Report Status PENDING   Incomplete  CULTURE, BLOOD (ROUTINE X 2)     Status: None   Collection Time    05/20/13  5:00 AM      Result Value Range Status   Specimen Description BLOOD LEFT HAND   Final    Special Requests BOTTLES DRAWN AEROBIC ONLY 1 CC   Final   Culture  Setup Time 05/20/2013 09:35   Final   Culture     Final   Value:        BLOOD CULTURE RECEIVED NO GROWTH TO DATE CULTURE WILL BE HELD FOR 5 DAYS BEFORE ISSUING A FINAL NEGATIVE REPORT   Report Status PENDING   Incomplete     Labs: Basic Metabolic Panel:  Recent Labs Lab 05/20/13 0450 05/21/13 0429 05/22/13 0410 05/23/13 0655 05/24/13 0510  NA 139 139 140 140 138  K 3.5 3.4* 3.7 3.4* 3.5  CL 97 101 105 106 106  CO2 31 28 27 27 27   GLUCOSE 102* 201* 210* 105* 199*  BUN 9 13 17 17 12   CREATININE 0.81 0.66 0.81 0.70 0.57  CALCIUM 9.6 8.7 8.8 8.6 8.2*  MG  --   --   --  2.1 2.0   Liver Function Tests:  Recent Labs Lab 05/20/13 0450 05/21/13 0429 05/22/13 0410 05/23/13 0655 05/24/13 0510  AST 26 40* 99* 40* 25  ALT 40* 46* 103* 87* 75*  ALKPHOS 160* 157* 133* 113 96  BILITOT 0.4 0.3 0.2* 0.3 0.3  PROT 8.5* 7.2 6.6 6.3 5.6*  ALBUMIN 2.6* 2.2* 2.1* 2.1* 2.0*   No results found for this basename: LIPASE, AMYLASE,  in the last 168 hours No results found for this basename: AMMONIA,  in the last 168 hours CBC:  Recent Labs Lab 05/20/13 0450 05/21/13 0429 05/22/13 0410 05/23/13 0655 05/24/13 0510  WBC 18.9* 20.9* 12.8* 7.8 24.6*  NEUTROABS  --   --   --  5.4  --   HGB 10.0* 8.5* 8.3* 8.5* 7.8*  HCT 31.9* 27.8* 26.4* 27.0* 24.1*  MCV 79.0 79.0 78.6 78.7 78.2  PLT 535* 435* 499* 556* 485*   Cardiac Enzymes: No results found for this basename: CKTOTAL, CKMB, CKMBINDEX, TROPONINI,  in the last 168 hours BNP: BNP (last 3 results)  Recent Labs  05/06/13 1059  PROBNP 15.8   CBG: No results found for this basename: GLUCAP,  in the last 168 hours     Signed:  Zeyna Mkrtchyan,CHRISTOPHER  [facility] 05/24/2013, 2:16 PM

## 2013-05-24 NOTE — Progress Notes (Signed)
Discharge instructions and prescriptions given.  Neupogen injection given before discharge.  Dr [name] met with patient  Before discharge and will call her with time to get Neulasta injection tomorrow.  Patient mentioned that she lost a pair of earrings at [facility] before she was transferred to [facility].  When I tried to fill out [employer], she said she did not have the time to fill it out, that the earrings were 52 years old and it did not matter that much for her.  Patient left via wheelchair to husband waiting at front door.   Rubye Oaks

## 2013-05-24 NOTE — Care Management Note (Signed)
Cm informed by Oncology CSW that gas vouchers are non existence. CSW informed [facility] has volunteers who provide tx but not further than [facility]. CSW advised pt to speak to Artist at [facility] at next appt at [facility]. CM informed pt of information provided by CSW. No other needs identified. Pt awaiting discharge.   Roxy Manns Saanvi Hakala,RN,BSN [phone]

## 2013-05-24 NOTE — Progress Notes (Signed)
PROBLEM LIST: 1. 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 Diagnostic tissue was obtained from mediastinal lymph nodes on 05/13/2013. Bone marrow carried out on 05/18/2013 was negative.. IPI score is 2. This is day 5 of cycle #1 following treatment with Cytoxan, Adriamycin, vincristine, prednisone and etoposide which was given on 05/20/2013. Neupogen was given on 6/9 and was to be given today as well, 6/10 Anticipated response rates from the above chemotherapy are in the 80% range with an anticipated 5 year survival rate of about 50%. The patient may be ready for discharge on 05/24/2013.   2. Respiratory insufficiency due to #1.   3. Episode of atrial fibrillation with rapid ventricular response on admission.   4. Dyslipidemia.   5. Seasonal allergies.   6. Diverticulosis.   7. Status post hysterectomy.   8. Full CODE STATUS.   HISTORY: The patient appears to have done well over the weekend following her chemotherapy. She did have some nausea without vomiting.She verbalizes no complaints today.Fever is  resolved. Blood cultures obtained on 05/20/2013 are without growth.   The patient is receiving daily Neupogen 300 mcg subcutaneous. She is due to complete a five-day course of prednisone 100 mg daily today.   Physical exam is within normal limits. R Port A Cath non tender.   Labs on 6/9  were notable for a hemoglobin of 8.5, potassium 3.4 and albumin 2.1. Today, H/H 7.8/24.1, WBC 24.6. Albumin 2.0There is no biochemical evidence for tumor lysis syndrome. Continue  Neupogen dose today  as scheduled.   A right-sided Port-A-Cath was placed on 6/9  Following discharge, the patient will need a PET scan to be done as an outpatient on 6/16 at 9:45 am. We will try to arrange for Neulasta at one of the hospitals closer to where she lives ([place]). We will also be following blood counts closely between now and the  time of her next treatment. She will be due for her second cycle of chemotherapy around 06/10/2013.  Dr. [name] to write an addendum to this note.    [facility] E 05/24/2013 11:48 AM  Agree with the above note. The patient seems to be improving by the day. She is being discharged today without oxygen. She has been scheduled for a PET scan to be carried out on 06/02/2013, next Monday. She will have Neulasta 6 mg subcutaneous tomorrow, probably here at the [facility] We will be watching CBCs closely. Once again we have reviewed with the patient the warning signs of potential complications from treatment. She understands the importance of calling us for any suggestion of infection, bleeding/bruising problems or a significant change in her clinical status.  Samul Dada 05/24/2013 5:31 PM

## 2013-05-24 NOTE — Care Management Note (Signed)
Cm spoke with patient at bedside concerning discharge planning with spouse present. Pt voiced concerns of tx issues from home in [place] to scheduled tx at [facility] [place]. Per Dr. Orland Dec note arrangements being made for tx at hospital neasr pt's home. Per pt would like to receive tx at [facility]. Cm left vm with CSW at [facility] concerning possible tx vouchers. Awaiting return phone call to notify pt if assistance is available.   Roxy Manns Kile Kabler,RN,BSN [phone]

## 2013-05-25 ENCOUNTER — Telehealth: Payer: Self-pay | Admitting: *Deleted

## 2013-05-25 ENCOUNTER — Ambulatory Visit (HOSPITAL_BASED_OUTPATIENT_CLINIC_OR_DEPARTMENT_OTHER): Payer: PRIVATE HEALTH INSURANCE

## 2013-05-25 VITALS — BP 145/91 | HR 76 | Temp 98.6°F

## 2013-05-25 DIAGNOSIS — Z5189 Encounter for other specified aftercare: Secondary | ICD-10-CM

## 2013-05-25 DIAGNOSIS — C8589 Other specified types of non-Hodgkin lymphoma, extranodal and solid organ sites: Secondary | ICD-10-CM

## 2013-05-25 MED ORDER — PEGFILGRASTIM INJECTION 6 MG/0.6ML
6.0000 mg | Freq: Once | SUBCUTANEOUS | Status: AC
  Administered 2013-05-25: 6 mg via SUBCUTANEOUS
  Filled 2013-05-25: qty 0.6

## 2013-05-25 NOTE — Telephone Encounter (Signed)
Megan Mcknight here for Neulasta injection following 1st Chop chemo treatment while in the hospital.  Was discharged to home yesterday.  States that she is feeling good,  No nausea, vomiting, or diarrhea.  Is drinking fluids and eating well.  Knows to call the office if she has any problems or concerns.

## 2013-05-25 NOTE — Patient Instructions (Addendum)

## 2013-05-26 ENCOUNTER — Telehealth: Payer: Self-pay | Admitting: Oncology

## 2013-05-26 LAB — CULTURE, BLOOD (ROUTINE X 2)
Culture: NO GROWTH
Culture: NO GROWTH

## 2013-05-26 NOTE — Telephone Encounter (Signed)
S/W PT IN REF TO HOSP F/U APPT. ON 06/08/13@11 :30 PER DR. [name] MAILED NP PACKET DX-ANAPLASTIC LRG CELL LYMPHOMA

## 2013-05-26 NOTE — Telephone Encounter (Signed)
C/D 05/26/13 for appt. 06/08/13

## 2013-05-26 NOTE — Telephone Encounter (Signed)
Pt had not arrived for injection appointment. Checking to see if she was coming and needed directions.

## 2013-05-27 ENCOUNTER — Other Ambulatory Visit: Payer: Self-pay

## 2013-05-27 ENCOUNTER — Encounter (HOSPITAL_COMMUNITY): Payer: PRIVATE HEALTH INSURANCE | Attending: Oncology

## 2013-05-27 ENCOUNTER — Encounter: Payer: Self-pay | Admitting: Oncology

## 2013-05-27 DIAGNOSIS — C8589 Other specified types of non-Hodgkin lymphoma, extranodal and solid organ sites: Secondary | ICD-10-CM

## 2013-05-27 DIAGNOSIS — IMO0002 Reserved for concepts with insufficient information to code with codable children: Secondary | ICD-10-CM

## 2013-05-27 LAB — CBC WITH DIFFERENTIAL/PLATELET
Basophils Absolute: 0 10*3/uL (ref 0.0–0.1)
Basophils Relative: 0 % (ref 0–1)
Eosinophils Absolute: 0 10*3/uL (ref 0.0–0.7)
Eosinophils Relative: 0 % (ref 0–5)
HCT: 27.1 % — ABNORMAL LOW (ref 36.0–46.0)
Hemoglobin: 8.6 g/dL — ABNORMAL LOW (ref 12.0–15.0)
Lymphocytes Relative: 74 % — ABNORMAL HIGH (ref 12–46)
Lymphs Abs: 1.4 10*3/uL (ref 0.7–4.0)
MCH: 24.8 pg — ABNORMAL LOW (ref 26.0–34.0)
MCHC: 31.7 g/dL (ref 30.0–36.0)
MCV: 78.1 fL (ref 78.0–100.0)
Monocytes Absolute: 0 10*3/uL — ABNORMAL LOW (ref 0.1–1.0)
Monocytes Relative: 0 % — ABNORMAL LOW (ref 3–12)
Neutro Abs: 0.5 10*3/uL — ABNORMAL LOW (ref 1.7–7.7)
Neutrophils Relative %: 26 % — ABNORMAL LOW (ref 43–77)
Platelets: 290 10*3/uL (ref 150–400)
RBC: 3.47 MIL/uL — ABNORMAL LOW (ref 3.87–5.11)
RDW: 13.5 % (ref 11.5–15.5)
WBC: 1.9 10*3/uL — ABNORMAL LOW (ref 4.0–10.5)

## 2013-05-27 MED ORDER — LEVOFLOXACIN 500 MG PO TABS: 500.0000 mg | ORAL_TABLET | Freq: Every day | ORAL | Status: DC

## 2013-05-27 NOTE — Telephone Encounter (Signed)
S/w pt that ANC is low and easy to get an infection. Dr [name] is prescribing levaquin daily for 7 days. Pt voiced understanding

## 2013-05-27 NOTE — Progress Notes (Signed)
This patient received chemotherapy, cycle 1 day 1 on 05/20/2013 for an anaplastic large cell lymphoma. Today is day 8. CBC was as follows:   White count 1.9 ANC 0.5 Hemoglobin 8.6 Hematocrit 27.1 Platelet count 290,000.  Patient was given a prescription for Levaquin 500 mg daily for 7 days.    On 05/30/2013 which is Monday, the patient is to have a CBC a PET scan here in [place]. She is to have a CBC on 06/06/2013, Monday. She has labs and M.D. visit as well as the start of cycle #2 chemotherapy on 06/08/2013.

## 2013-05-27 NOTE — Progress Notes (Signed)
Labs drawn today for cbc/diff 

## 2013-05-30 ENCOUNTER — Encounter (HOSPITAL_BASED_OUTPATIENT_CLINIC_OR_DEPARTMENT_OTHER): Payer: PRIVATE HEALTH INSURANCE

## 2013-05-30 ENCOUNTER — Encounter (HOSPITAL_COMMUNITY)
Admission: RE | Admit: 2013-05-30 | Discharge: 2013-05-30 | Disposition: A | Discharge status: Home / Self Care | Payer: PRIVATE HEALTH INSURANCE | Source: Ambulatory Visit | Attending: Oncology | Admitting: Oncology

## 2013-05-30 DIAGNOSIS — R918 Other nonspecific abnormal finding of lung field: Secondary | ICD-10-CM | POA: Insufficient documentation

## 2013-05-30 DIAGNOSIS — C8589 Other specified types of non-Hodgkin lymphoma, extranodal and solid organ sites: Secondary | ICD-10-CM | POA: Insufficient documentation

## 2013-05-30 LAB — CBC WITH DIFFERENTIAL/PLATELET
Basophils Absolute: 0 10*3/uL (ref 0.0–0.1)
Basophils Relative: 1 % (ref 0–1)
Eosinophils Absolute: 0 10*3/uL (ref 0.0–0.7)
Eosinophils Relative: 0 % (ref 0–5)
HCT: 30.8 % — ABNORMAL LOW (ref 36.0–46.0)
Hemoglobin: 9.8 g/dL — ABNORMAL LOW (ref 12.0–15.0)
Lymphocytes Relative: 68 % — ABNORMAL HIGH (ref 12–46)
Lymphs Abs: 2.3 10*3/uL (ref 0.7–4.0)
MCH: 24.9 pg — ABNORMAL LOW (ref 26.0–34.0)
MCHC: 31.8 g/dL (ref 30.0–36.0)
MCV: 78.2 fL (ref 78.0–100.0)
Monocytes Absolute: 0.4 10*3/uL (ref 0.1–1.0)
Monocytes Relative: 10 % (ref 3–12)
Neutro Abs: 0.7 10*3/uL — ABNORMAL LOW (ref 1.7–7.7)
Neutrophils Relative %: 21 % — ABNORMAL LOW (ref 43–77)
Platelets: 138 10*3/uL — ABNORMAL LOW (ref 150–400)
RBC: 3.94 MIL/uL (ref 3.87–5.11)
RDW: 13.7 % (ref 11.5–15.5)
WBC: 3.5 10*3/uL — ABNORMAL LOW (ref 4.0–10.5)

## 2013-05-30 LAB — GLUCOSE, CAPILLARY: Glucose-Capillary: 107 mg/dL — ABNORMAL HIGH (ref 70–99)

## 2013-05-30 LAB — CHROMOSOME ANALYSIS, BONE MARROW

## 2013-05-30 MED ORDER — FLUDEOXYGLUCOSE F - 18 (FDG) INJECTION
17.0000 | Freq: Once | INTRAVENOUS | Status: AC | PRN
  Administered 2013-05-30: 17 via INTRAVENOUS

## 2013-05-30 NOTE — Progress Notes (Signed)
Labs drawn today for cbc/diff 

## 2013-05-31 LAB — PATHOLOGIST SMEAR REVIEW

## 2013-06-01 LAB — PATHOLOGIST SMEAR REVIEW

## 2013-06-02 ENCOUNTER — Telehealth: Payer: Self-pay

## 2013-06-02 NOTE — Telephone Encounter (Signed)
Cytogenetic report received and forwarded to Dr [name]

## 2013-06-06 ENCOUNTER — Encounter (HOSPITAL_BASED_OUTPATIENT_CLINIC_OR_DEPARTMENT_OTHER): Payer: PRIVATE HEALTH INSURANCE

## 2013-06-06 DIAGNOSIS — C8589 Other specified types of non-Hodgkin lymphoma, extranodal and solid organ sites: Secondary | ICD-10-CM

## 2013-06-06 LAB — CBC WITH DIFFERENTIAL/PLATELET
Basophils Absolute: 0.2 10*3/uL — ABNORMAL HIGH (ref 0.0–0.1)
Basophils Relative: 1 % (ref 0–1)
Eosinophils Absolute: 0 10*3/uL (ref 0.0–0.7)
Eosinophils Relative: 0 % (ref 0–5)
HCT: 29.2 % — ABNORMAL LOW (ref 36.0–46.0)
Hemoglobin: 9.4 g/dL — ABNORMAL LOW (ref 12.0–15.0)
Lymphocytes Relative: 22 % (ref 12–46)
Lymphs Abs: 3.4 10*3/uL (ref 0.7–4.0)
MCH: 25.6 pg — ABNORMAL LOW (ref 26.0–34.0)
MCHC: 32.2 g/dL (ref 30.0–36.0)
MCV: 79.6 fL (ref 78.0–100.0)
Monocytes Absolute: 1 10*3/uL (ref 0.1–1.0)
Monocytes Relative: 7 % (ref 3–12)
Neutro Abs: 11.1 10*3/uL — ABNORMAL HIGH (ref 1.7–7.7)
Neutrophils Relative %: 70 % (ref 43–77)
Platelets: 117 10*3/uL — ABNORMAL LOW (ref 150–400)
RBC: 3.67 MIL/uL — ABNORMAL LOW (ref 3.87–5.11)
RDW: 15.7 % — ABNORMAL HIGH (ref 11.5–15.5)
WBC: 15.7 10*3/uL — ABNORMAL HIGH (ref 4.0–10.5)

## 2013-06-06 NOTE — Progress Notes (Signed)
Labs drawn today for cbc/diff 

## 2013-06-07 LAB — FUNGUS CULTURE W SMEAR: Fungal Smear: NONE SEEN

## 2013-06-07 LAB — PATHOLOGIST SMEAR REVIEW

## 2013-06-08 ENCOUNTER — Encounter: Payer: Self-pay | Admitting: Oncology

## 2013-06-08 ENCOUNTER — Other Ambulatory Visit: Payer: Self-pay

## 2013-06-08 ENCOUNTER — Ambulatory Visit: Payer: PRIVATE HEALTH INSURANCE

## 2013-06-08 ENCOUNTER — Other Ambulatory Visit: Payer: PRIVATE HEALTH INSURANCE | Admitting: Lab

## 2013-06-08 ENCOUNTER — Ambulatory Visit (HOSPITAL_BASED_OUTPATIENT_CLINIC_OR_DEPARTMENT_OTHER): Payer: PRIVATE HEALTH INSURANCE | Admitting: Oncology

## 2013-06-08 VITALS — BP 125/86 | HR 78 | Temp 97.9°F | Resp 18 | Ht 68.0 in | Wt 216.1 lb

## 2013-06-08 DIAGNOSIS — C8589 Other specified types of non-Hodgkin lymphoma, extranodal and solid organ sites: Secondary | ICD-10-CM

## 2013-06-08 LAB — FUNGUS CULTURE W SMEAR
Fungal Smear: NONE SEEN
Fungal Smear: NONE SEEN
Special Requests: 7

## 2013-06-08 MED ORDER — NYSTATIN 100000 UNIT/GM EX POWD: CUTANEOUS | Status: DC

## 2013-06-08 MED ORDER — PREDNISONE 20 MG PO TABS: ORAL_TABLET | ORAL | Status: DC

## 2013-06-08 MED ORDER — PROCHLORPERAZINE MALEATE 10 MG PO TABS: 10.0000 mg | ORAL_TABLET | Freq: Four times a day (QID) | ORAL | Status: DC | PRN

## 2013-06-08 MED ORDER — ONDANSETRON HCL 8 MG PO TABS: 8.0000 mg | ORAL_TABLET | Freq: Two times a day (BID) | ORAL | Status: DC | PRN

## 2013-06-08 NOTE — Progress Notes (Signed)
CC:   Salvatore Decent. Dorris Fetch, M.D. [name] D. Felecia Shelling, MD  PROBLEM LIST: 1. 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 Dorris Fetch, M.D. There is also involvement of the lungs with     multiple lung nodules, liver and bilateral inguinal lymph nodes,     the latter seen on PET scan from 05/30/2013.  Bone marrow on     05/18/2013 was negative.  IPI score was 2.  Histologic diagnosis     was made on 05/13/2013 by mediastinal lymph node biopsy.     Chemotherapy with Cytoxan, Adriamycin, vincristine, etoposide and     prednisone in conjunction with Neulasta was initiated on     05/20/2013. 2. Brief episode of atrial fibrillation with rapid ventricular     response in late May 2014 at time of lymphoma presentation. 3. Dyslipidemia. 4. Seasonal allergies. 5. Diverticulosis. 6. Status post hysterectomy. 7. Right-sided Port-A-Cath placed on 05/23/2013.  MEDICATIONS:  Reviewed and recorded. Current Outpatient Prescriptions  Medication Sig Dispense Refill  . albuterol (PROVENTIL HFA;VENTOLIN HFA) 108 (90 BASE) MCG/ACT inhaler Inhale 2 puffs into the lungs every 6 (six) hours as needed for wheezing.      Marland Kitchen allopurinol (ZYLOPRIM) 300 MG tablet Take 1 tablet (300 mg total) by mouth daily.  30 tablet  0  . amLODipine (NORVASC) 5 MG tablet Take 1 tablet (5 mg total) by mouth daily.  30 tablet  0  . guaiFENesin-dextromethorphan (ROBITUSSIN DM) 100-10 MG/5ML syrup Take 5 mLs by mouth every 4 (four) hours as needed for cough.  118 mL  0  . metoprolol tartrate (LOPRESSOR) 25 MG tablet Take 1 tablet (25 mg total) by mouth 3 (three) times daily.  90 tablet  0  . montelukast (SINGULAIR) 10 MG tablet Take 10 mg by mouth daily as needed (Allergies).       . pantoprazole (PROTONIX) 40 MG tablet Take 1 tablet (40 mg total) by mouth daily.  30  tablet  0  . rosuvastatin (CRESTOR) 10 MG tablet Take 10 mg by mouth daily.      Marland Kitchen senna (SENOKOT) 8.6 MG TABS Take 1 tablet (8.6 mg total) by mouth at bedtime.  120 each  0  . ibuprofen (ADVIL,MOTRIN) 200 MG tablet Take 400 mg by mouth every 6 (six) hours as needed for pain.      Marland Kitchen nystatin (MYCOSTATIN) powder Apply to affected skin folds two to three times per day.  15 g  1  . ondansetron (ZOFRAN) 8 MG tablet Take 1 tablet (8 mg total) by mouth every 12 (twelve) hours as needed for nausea.  30 tablet  2  . oxyCODONE (OXY IR/ROXICODONE) 5 MG immediate release tablet Take 1 tablet (5 mg total) by mouth every 4 (four) hours as needed.  60 tablet  0  . predniSONE (DELTASONE) 20 MG tablet Take 5 tablets (100 mg) daily for 5 days with each round of chemotherapy  125 tablet  1  . prochlorperazine (COMPAZINE) 10 MG tablet Take 1 tablet (10 mg total) by mouth every 6 (six) hours as needed (nausea).  30 tablet  2   No current facility-administered medications for this visit.     SMOKING HISTORY:  The patient smoked half a pack cigarettes a day for over 20 years.  She stopped smoking in  early May 2014.    HISTORY:  Megan Mcknight is a 52 year old married African American female who was accompanied today by her husband, [name]. The patient and her husband live in [place], which is about 45 minutes from here [place]. The patient had been in fairly good health up until a couple months ago when she presented with a respiratory illness characterized by fever, cough and shortness of breath.  The patient was found to have extensive pulmonary nodules on a chest CT scan from 05/10/2013.  She underwent video bronchoscopy with endobronchial ultrasound and mediastinoscopy on 05/13/2013 by Salvatore Decent. Dorris Fetch, M.D. The mediastinal biopsies showed anaplastic large-cell lymphoma, CD 30 and ALK positive. CD 20 negative.  Tumor was high-grade stage IV with an IPI score of 2.  At that point, the  patient was running low-grade fever, was on oxygen.  She underwent a bone marrow aspirate and biopsy on 05/18/2013 that was negative for lymphoma.  Chemotherapy with CHOEP regimen was started on 05/20/2013.  I reduced the doses slightly to avoid profound bone marrow suppression.  The patient received Neulasta while she was hospitalized following her treatments and then subsequently received Neupogen daily while hospitalized and then on 05/25/2013, after she was discharged from the hospital, she received Neulasta 6 mg subcu.  The patient had a Port-A-Cath placed via interventional radiology on the right side on 05/23/2013.  She was not able to get a PET scan while hospitalized but did have a PET scan on 05/30/2013.  I have had a chance to review the scan and actually it shows only uptake in bilateral inguinal lymph nodes that are not really significantly enlarged.  The patient has had a striking regression of tumor in her lungs, at least 90%.  There has also been a decrease in the liver masses.  None of those areas showed increased uptake, interestingly enough.  Clinically the patient is much improved.  She has been losing her hair. She only has some minimal dyspnea on exertion and is not using any oxygen.  She denies any pain.  She has a slight cough, again improved. There has been no fever or night sweats.  She tolerated the treatment extremely well with only some mild nausea on day 2 which was Saturday, June 7th.  There was no vomiting, mucositis or diarrhea.  The patient's energy is much improved.  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  She is still out on disability.  Her only other complaint today is of a rash under her breasts and in the lower abdominal area under an abdominal crease.  She has been using Neosporin on this.  Aside from this, the patient is doing well.  PHYSICAL EXAMINATION:  The patient looks well.   She is 52 years old. She is in no acute distress.  Weight is 216 pounds 1.6 ounces.  Baseline weight is 226 pounds.  Height is 5 feet 8 inches, body surface area 2.17 sq m.  Blood pressure 125/86.  She is afebrile.  O2 saturation on room air at rest was 100%.  Other vital signs are normal.  There is no scleral icterus.  Mouth and pharynx are benign.  She has some missing teeth, particular the molars.  Scalp alopecia from the chemotherapy. There is no peripheral adenopathy palpable in the neck, axillary or inguinal areas.  Heart and lungs are normal.  Breasts are not examined. There is a right-sided Port-A-Cath.  Steri-Strips  are still in place. Under the breast and in the abdominal crease is a moist dermatologic condition with some desquamation.  I did not see obvious yeast but I suspect this probably is a yeast infection.  The patient stated this started about 3 or 4 days ago.  She had been on prednisone a couple weeks ago.  Abdomen is somewhat obese, nontender with no organomegaly or masses palpable.  The patient has undergone a hysterectomy. Extremities:  No peripheral edema or clubbing.  No dorsiflexor weakness. Neurologic exam is normal.  LABORATORY DATA:  Was not carried out today.  On 06/06/2013, white count was 15.7 with an ANC of 11.1, hemoglobin 9.4, hematocrit 29.2, platelets 117,000.  On 05/30/2013, white count was 3.5 with an ANC of 0.7. Platelets were 138,000.  Hemoglobin 9.8, hematocrit 30.8.  Chemistries from 05/23/2013 showed an albumin of 2.1, calcium of 3.4 with a magnesium of 2.1 and an LDH of 191.  Uric acid was 2.7.  LDH on 05/18/2013 was 271.  Ferritin was 15O2 with an iron saturation of 7%, iron of 10, and TIBC of 139, consistent with the anemia of chronic disease.  IMAGING STUDIES: 1. Chest x-ray, 2 view, from 03/09/2013 showed no active disease. 2. 2-D echocardiogram on 05/06/2013 showed  moderate LVH with     disproportionate septal thickening.  Systolic  function was     hyperdynamic.  The estimated ejection fraction was 75%.  Wall     motion was normal.  There were no regional wall motion     abnormalities. 3. Chest x-ray, 1 view, on 05/09/2013 showed diffuse micronodular     pattern with worsening density at the lung bases. 4. CT scan of the chest without IV contrast on 05/10/2013 showed     diffuse pulmonary nodules and enlarged bilateral mediastinal lymph     nodes, all concerning for metastatic disease.  There were multi     focal low attenuation lesions within the liver also concerning for     metastatic disease. 5. PET scan on 05/30/2013 showed small hypermetabolic bilateral     inguinal lymph nodes with maximum SUV of 9.8 for the right inguinal     lymph node and maximum SUV of 6.8 for the left inguinal lymph node.     There was intense hypermetabolic activity throughout the axial and     appendicular skeleton felt to be due to either marrow involvement     by lymphoma or pharmacologic marrow hyperstimulation.  There were     numerous diffuse tiny pulmonary nodules that showed no     hypermetabolic activity but may have been below the PET size     threshold for detection of malignancy.   IMPRESSION AND PLAN:  Mrs. Brenner has had an excellent clinical and scan response to her first cycle of chemotherapy.  She did not have lab work today.  I am setting her up for her second cycle of chemotherapy in the same doses as for cycle #1.  Treatment will start on Monday, June 30 with 3 days of VP-16, scheduled through July 2nd.  We are arranging for the patient to have Neulasta on day 4, which will be July 3rd, Thursday Neulasta 6 mg subcu.  This will be given at the [facility].  We are also arranging for the patient to have weekly CBCs also at [facility]. These will be carried out on July 7th and July 14th.  The patient will be scheduled for her third cycle of  chemotherapy on July 21st, at which time she will have CBC,  chemistries, including uric acid and LDH. Neulasta should be given on day 4, which will be July 24th.  I anticipate 6 cycles of chemotherapy and hopefully the patient will achieve a complete remission which will be durable and curative for this potentially curable lymphoma.  Thus far, the patient has done extremely well with her treatment and has had an excellent response.  The patient was given prescriptions for the following medicines: Zofran, Compazine and nystatin powder. She will take prednisone 100 mg daily for 5 days with each of her chemotherapy cycles.  Today's physical was extended about an hour in reviewing and consolidating information from the patient's hospital admission and prior treatment.    ______________________________ Samul Dada, M.D. DSM/MEDQ  D:  06/08/2013  T:  06/08/2013  Job:  [id number]

## 2013-06-08 NOTE — Progress Notes (Signed)
This office note has been dictated.  #[id number]

## 2013-06-08 NOTE — Progress Notes (Signed)
Checked in patient for hospital follow up. All communication via email. Didn't ask about living will/PO

## 2013-06-09 ENCOUNTER — Telehealth: Payer: Self-pay | Admitting: Oncology

## 2013-06-13 ENCOUNTER — Other Ambulatory Visit (HOSPITAL_BASED_OUTPATIENT_CLINIC_OR_DEPARTMENT_OTHER): Payer: PRIVATE HEALTH INSURANCE | Admitting: Lab

## 2013-06-13 ENCOUNTER — Other Ambulatory Visit: Payer: Self-pay | Admitting: Oncology

## 2013-06-13 ENCOUNTER — Encounter: Payer: Self-pay | Admitting: Medical Oncology

## 2013-06-13 ENCOUNTER — Other Ambulatory Visit: Payer: Self-pay | Admitting: Medical Oncology

## 2013-06-13 ENCOUNTER — Ambulatory Visit (HOSPITAL_BASED_OUTPATIENT_CLINIC_OR_DEPARTMENT_OTHER): Payer: PRIVATE HEALTH INSURANCE

## 2013-06-13 VITALS — BP 120/75 | HR 76 | Temp 98.5°F | Resp 18

## 2013-06-13 DIAGNOSIS — C8589 Other specified types of non-Hodgkin lymphoma, extranodal and solid organ sites: Secondary | ICD-10-CM

## 2013-06-13 DIAGNOSIS — IMO0002 Reserved for concepts with insufficient information to code with codable children: Secondary | ICD-10-CM

## 2013-06-13 DIAGNOSIS — Z5111 Encounter for antineoplastic chemotherapy: Secondary | ICD-10-CM

## 2013-06-13 LAB — COMPREHENSIVE METABOLIC PANEL (CC13)
ALT: 27 U/L (ref 0–55)
AST: 15 U/L (ref 5–34)
Albumin: 3.4 g/dL — ABNORMAL LOW (ref 3.5–5.0)
Alkaline Phosphatase: 78 U/L (ref 40–150)
BUN: 14.4 mg/dL (ref 7.0–26.0)
CO2: 24 mEq/L (ref 22–29)
Calcium: 9.4 mg/dL (ref 8.4–10.4)
Chloride: 108 mEq/L (ref 98–109)
Creatinine: 0.8 mg/dL (ref 0.6–1.1)
Glucose: 204 mg/dl — ABNORMAL HIGH (ref 70–140)
Potassium: 3.9 mEq/L (ref 3.5–5.1)
Sodium: 141 mEq/L (ref 136–145)
Total Bilirubin: 0.37 mg/dL (ref 0.20–1.20)
Total Protein: 6.5 g/dL (ref 6.4–8.3)

## 2013-06-13 LAB — CBC WITH DIFFERENTIAL/PLATELET
BASO%: 1 % (ref 0.0–2.0)
Basophils Absolute: 0.1 10*3/uL (ref 0.0–0.1)
EOS%: 0.4 % (ref 0.0–7.0)
Eosinophils Absolute: 0 10*3/uL (ref 0.0–0.5)
HCT: 32.2 % — ABNORMAL LOW (ref 34.8–46.6)
HGB: 9.7 g/dL — ABNORMAL LOW (ref 11.6–15.9)
LYMPH%: 32.5 % (ref 14.0–49.7)
MCH: 24.7 pg — ABNORMAL LOW (ref 25.1–34.0)
MCHC: 30.1 g/dL — ABNORMAL LOW (ref 31.5–36.0)
MCV: 82.1 fL (ref 79.5–101.0)
MONO#: 0.4 10*3/uL (ref 0.1–0.9)
MONO%: 5 % (ref 0.0–14.0)
NEUT#: 4.9 10*3/uL (ref 1.5–6.5)
NEUT%: 61.1 % (ref 38.4–76.8)
Platelets: 277 10*3/uL (ref 145–400)
RBC: 3.92 10*6/uL (ref 3.70–5.45)
RDW: 20.6 % — ABNORMAL HIGH (ref 11.2–14.5)
WBC: 8 10*3/uL (ref 3.9–10.3)
lymph#: 2.6 10*3/uL (ref 0.9–3.3)
nRBC: 1 % — ABNORMAL HIGH (ref 0–0)

## 2013-06-13 LAB — LACTATE DEHYDROGENASE (CC13): LDH: 224 U/L (ref 125–245)

## 2013-06-13 LAB — URIC ACID (CC13): Uric Acid, Serum: 3.6 mg/dl (ref 2.6–7.4)

## 2013-06-13 LAB — TECHNOLOGIST REVIEW

## 2013-06-13 MED ORDER — ONDANSETRON 16 MG/50ML IVPB (CHCC)
16.0000 mg | Freq: Once | INTRAVENOUS | Status: AC
  Administered 2013-06-13: 16 mg via INTRAVENOUS

## 2013-06-13 MED ORDER — SODIUM CHLORIDE 0.9 % IV SOLN
600.0000 mg/m2 | Freq: Once | INTRAVENOUS | Status: AC
  Administered 2013-06-13: 1300 mg via INTRAVENOUS
  Filled 2013-06-13: qty 65

## 2013-06-13 MED ORDER — HEPARIN SOD (PORK) LOCK FLUSH 100 UNIT/ML IV SOLN
500.0000 [IU] | Freq: Once | INTRAVENOUS | Status: AC | PRN
  Administered 2013-06-13: 500 [IU]
  Filled 2013-06-13: qty 5

## 2013-06-13 MED ORDER — DOXORUBICIN HCL CHEMO IV INJECTION 2 MG/ML
40.0000 mg/m2 | Freq: Once | INTRAVENOUS | Status: AC
  Administered 2013-06-13: 86 mg via INTRAVENOUS
  Filled 2013-06-13: qty 43

## 2013-06-13 MED ORDER — LIDOCAINE-PRILOCAINE 2.5-2.5 % EX KIT: PACK | CUTANEOUS | Status: DC

## 2013-06-13 MED ORDER — SODIUM CHLORIDE 0.9 % IJ SOLN
10.0000 mL | INTRAMUSCULAR | Status: DC | PRN
  Administered 2013-06-13: 10 mL
  Filled 2013-06-13: qty 10

## 2013-06-13 MED ORDER — DEXAMETHASONE SODIUM PHOSPHATE 20 MG/5ML IJ SOLN
20.0000 mg | Freq: Once | INTRAMUSCULAR | Status: AC
  Administered 2013-06-13: 20 mg via INTRAVENOUS

## 2013-06-13 MED ORDER — VINCRISTINE SULFATE CHEMO INJECTION 1 MG/ML
2.0000 mg | Freq: Once | INTRAVENOUS | Status: AC
  Administered 2013-06-13: 2 mg via INTRAVENOUS
  Filled 2013-06-13: qty 2

## 2013-06-13 MED ORDER — SODIUM CHLORIDE 0.9 % IV SOLN
80.0000 mg/m2 | Freq: Every day | INTRAVENOUS | Status: DC
  Administered 2013-06-13: 170 mg via INTRAVENOUS
  Filled 2013-06-13: qty 8.5

## 2013-06-13 NOTE — Patient Instructions (Addendum)
[facility] Discharge Instructions for Patients Receiving Chemotherapy  Today you received the following chemotherapy agents: Cytoxan, VP-16, Vincristine, Adriamycin. To help prevent nausea and vomiting after your treatment, we encourage you to take your nausea medication.   If you develop nausea and vomiting that is not controlled by your nausea medication, call the clinic.   BELOW ARE SYMPTOMS THAT SHOULD BE REPORTED IMMEDIATELY:  *FEVER GREATER THAN 100.5 F  *CHILLS WITH OR WITHOUT FEVER  NAUSEA AND VOMITING THAT IS NOT CONTROLLED WITH YOUR NAUSEA MEDICATION  *UNUSUAL SHORTNESS OF BREATH  *UNUSUAL BRUISING OR BLEEDING  TENDERNESS IN MOUTH AND THROAT WITH OR WITHOUT PRESENCE OF ULCERS  *URINARY PROBLEMS  *BOWEL PROBLEMS  UNUSUAL RASH Items with * indicate a potential emergency and should be followed up as soon as possible.  Feel free to call the clinic you have any questions or concerns. The clinic phone number is [phone].

## 2013-06-13 NOTE — Progress Notes (Signed)
Good blood return noted before, every 5 cc during, and after completion of Adriamycin push through [facility].

## 2013-06-14 ENCOUNTER — Ambulatory Visit (HOSPITAL_BASED_OUTPATIENT_CLINIC_OR_DEPARTMENT_OTHER): Payer: PRIVATE HEALTH INSURANCE

## 2013-06-14 ENCOUNTER — Other Ambulatory Visit: Payer: Self-pay | Admitting: Oncology

## 2013-06-14 VITALS — BP 134/73 | HR 72 | Temp 98.6°F

## 2013-06-14 DIAGNOSIS — IMO0002 Reserved for concepts with insufficient information to code with codable children: Secondary | ICD-10-CM

## 2013-06-14 DIAGNOSIS — C8589 Other specified types of non-Hodgkin lymphoma, extranodal and solid organ sites: Secondary | ICD-10-CM

## 2013-06-14 DIAGNOSIS — Z5111 Encounter for antineoplastic chemotherapy: Secondary | ICD-10-CM

## 2013-06-14 MED ORDER — SODIUM CHLORIDE 0.9 % IV SOLN
Freq: Once | INTRAVENOUS | Status: AC
  Administered 2013-06-14: 08:00:00 via INTRAVENOUS

## 2013-06-14 MED ORDER — ONDANSETRON 16 MG/50ML IVPB (CHCC)
16.0000 mg | Freq: Once | INTRAVENOUS | Status: AC
  Administered 2013-06-14: 16 mg via INTRAVENOUS

## 2013-06-14 MED ORDER — SODIUM CHLORIDE 0.9 % IV SOLN
80.0000 mg/m2 | Freq: Once | INTRAVENOUS | Status: AC
  Administered 2013-06-14: 170 mg via INTRAVENOUS
  Filled 2013-06-14: qty 8.5

## 2013-06-14 MED ORDER — DEXAMETHASONE SODIUM PHOSPHATE 20 MG/5ML IJ SOLN
20.0000 mg | Freq: Once | INTRAMUSCULAR | Status: AC
  Administered 2013-06-14: 20 mg via INTRAVENOUS

## 2013-06-14 NOTE — Patient Instructions (Signed)
[facility] Discharge Instructions for Patients Receiving Chemotherapy  Today you received the following chemotherapy agents Etoposide (VP 16) To help prevent nausea and vomiting after your treatment, we encourage you to take your nausea medication as prescribed.If you develop nausea and vomiting that is not controlled by your nausea medication, call the clinic.   BELOW ARE SYMPTOMS THAT SHOULD BE REPORTED IMMEDIATELY:  *FEVER GREATER THAN 100.5 F  *CHILLS WITH OR WITHOUT FEVER  NAUSEA AND VOMITING THAT IS NOT CONTROLLED WITH YOUR NAUSEA MEDICATION  *UNUSUAL SHORTNESS OF BREATH  *UNUSUAL BRUISING OR BLEEDING  TENDERNESS IN MOUTH AND THROAT WITH OR WITHOUT PRESENCE OF ULCERS  *URINARY PROBLEMS  *BOWEL PROBLEMS  UNUSUAL RASH Items with * indicate a potential emergency and should be followed up as soon as possible.  Feel free to call the clinic you have any questions or concerns. The clinic phone number is [phone].    

## 2013-06-15 ENCOUNTER — Ambulatory Visit (HOSPITAL_BASED_OUTPATIENT_CLINIC_OR_DEPARTMENT_OTHER): Payer: PRIVATE HEALTH INSURANCE

## 2013-06-15 VITALS — BP 118/70 | HR 71 | Temp 98.6°F | Resp 18

## 2013-06-15 DIAGNOSIS — C8589 Other specified types of non-Hodgkin lymphoma, extranodal and solid organ sites: Secondary | ICD-10-CM

## 2013-06-15 DIAGNOSIS — Z5111 Encounter for antineoplastic chemotherapy: Secondary | ICD-10-CM

## 2013-06-15 DIAGNOSIS — IMO0002 Reserved for concepts with insufficient information to code with codable children: Secondary | ICD-10-CM

## 2013-06-15 MED ORDER — DEXAMETHASONE SODIUM PHOSPHATE 20 MG/5ML IJ SOLN
20.0000 mg | Freq: Once | INTRAMUSCULAR | Status: AC
  Administered 2013-06-15: 20 mg via INTRAVENOUS

## 2013-06-15 MED ORDER — SODIUM CHLORIDE 0.9 % IV SOLN
80.0000 mg/m2 | Freq: Once | INTRAVENOUS | Status: AC
  Administered 2013-06-15: 170 mg via INTRAVENOUS
  Filled 2013-06-15: qty 8.5

## 2013-06-15 MED ORDER — ONDANSETRON 16 MG/50ML IVPB (CHCC)
16.0000 mg | Freq: Once | INTRAVENOUS | Status: AC
  Administered 2013-06-15: 16 mg via INTRAVENOUS

## 2013-06-15 NOTE — Patient Instructions (Addendum)
[facility] Discharge Instructions for Patients Receiving Chemotherapy  Today you received the following chemotherapy agent VP-16 (Etoposide)  To help prevent nausea and vomiting after your treatment, we encourage you to take your nausea medication.   If you develop nausea and vomiting that is not controlled by your nausea medication, call the clinic.   BELOW ARE SYMPTOMS THAT SHOULD BE REPORTED IMMEDIATELY:  *FEVER GREATER THAN 100.5 F  *CHILLS WITH OR WITHOUT FEVER  NAUSEA AND VOMITING THAT IS NOT CONTROLLED WITH YOUR NAUSEA MEDICATION  *UNUSUAL SHORTNESS OF BREATH  *UNUSUAL BRUISING OR BLEEDING  TENDERNESS IN MOUTH AND THROAT WITH OR WITHOUT PRESENCE OF ULCERS  *URINARY PROBLEMS  *BOWEL PROBLEMS  UNUSUAL RASH Items with * indicate a potential emergency and should be followed up as soon as possible.  Feel free to call the clinic you have any questions or concerns. The clinic phone number is [phone].   ETOPOSIDE, VP-16 (e toe POE side) is a chemotherapy drug. It is used to treat testicular cancer, lung cancer, and other cancers. This medicine may be used for other purposes; ask your health care provider or pharmacist if you have questions. What should I tell my health care provider before I take this medicine? They need to know if you have any of these conditions: -infection -kidney disease -low blood counts, like low white cell, platelet, or red cell counts -an unusual or allergic reaction to etoposide, other chemotherapeutic agents, other medicines, foods, dyes, or preservatives -pregnant or trying to get pregnant -breast-feeding How should I use this medicine? This medicine is for infusion into a vein. It is administered in a hospital or clinic by a specially trained health care professional. Talk to your pediatrician regarding the use of this medicine in children. Special care may be needed. Overdosage: If you think you have taken too much of  this medicine contact a poison control center or emergency room at once. NOTE: This medicine is only for you. Do not share this medicine with others. What if I miss a dose? It is important not to miss your dose. Call your doctor or health care professional if you are unable to keep an appointment. What may interact with this medicine? -cyclosporine -medicines to increase blood counts like filgrastim, pegfilgrastim, sargramostim -vaccines This list may not describe all possible interactions. Give your health care provider a list of all the medicines, herbs, non-prescription drugs, or dietary supplements you use. Also tell them if you smoke, drink alcohol, or use illegal drugs. Some items may interact with your medicine. What should I watch for while using this medicine? Visit your doctor for checks on your progress. This drug may make you feel generally unwell. This is not uncommon, as chemotherapy can affect healthy cells as well as cancer cells. Report any side effects. Continue your course of treatment even though you feel ill unless your doctor tells you to stop. In some cases, you may be given additional medicines to help with side effects. Follow all directions for their use. Call your doctor or health care professional for advice if you get a fever, chills or sore throat, or other symptoms of a cold or flu. Do not treat yourself. This drug decreases your body's ability to fight infections. Try to avoid being around people who are sick. This medicine may increase your risk to bruise or bleed. Call your doctor or health care professional if you notice any unusual bleeding. Be careful brushing and flossing your teeth or using a   toothpick because you may get an infection or bleed more easily. If you have any dental work done, tell your dentist you are receiving this medicine. Avoid taking products that contain aspirin, acetaminophen, ibuprofen, naproxen, or ketoprofen unless instructed by your  doctor. These medicines may hide a fever. Do not become pregnant while taking this medicine. Women should inform their doctor if they wish to become pregnant or think they might be pregnant. There is a potential for serious side effects to an unborn child. Talk to your health care professional or pharmacist for more information. Do not breast-feed an infant while taking this medicine. What side effects may I notice from receiving this medicine? Side effects that you should report to your doctor or health care professional as soon as possible: -allergic reactions like skin rash, itching or hives, swelling of the face, lips, or tongue -low blood counts - this medicine may decrease the number of white blood cells, red blood cells and platelets. You may be at increased risk for infections and bleeding. -signs of infection - fever or chills, cough, sore throat, pain or difficulty passing urine -signs of decreased platelets or bleeding - bruising, pinpoint red spots on the skin, black, tarry stools, blood in the urine -signs of decreased red blood cells - unusually weak or tired, fainting spells, lightheadedness -breathing problems -changes in vision -mouth or throat sores or ulcers -pain, redness, swelling or irritation at the injection site -pain, tingling, numbness in the hands or feet -redness, blistering, peeling or loosening of the skin, including inside the mouth -seizures -vomiting Side effects that usually do not require medical attention (report to your doctor or health care professional if they continue or are bothersome): -diarrhea -hair loss -loss of appetite -nausea -stomach pain This list may not describe all possible side effects. Call your doctor for medical advice about side effects. You may report side effects to FDA at 1-800-FDA-1088. Where should I keep my medicine? This drug is given in a hospital or clinic and will not be stored at home. NOTE: This sheet is a summary. It  may not cover all possible information. If you have questions about this medicine, talk to your doctor, pharmacist, or health care provider.  2012, [employer]vier/Gold Standard. (04/03/2008 5:24:12 PM) 

## 2013-06-16 ENCOUNTER — Telehealth: Payer: Self-pay | Admitting: *Deleted

## 2013-06-16 ENCOUNTER — Other Ambulatory Visit: Payer: Self-pay | Admitting: Oncology

## 2013-06-16 ENCOUNTER — Other Ambulatory Visit: Payer: Self-pay | Admitting: Medical Oncology

## 2013-06-16 ENCOUNTER — Encounter (HOSPITAL_COMMUNITY): Payer: PRIVATE HEALTH INSURANCE | Attending: Oncology

## 2013-06-16 VITALS — BP 130/67 | HR 74 | Temp 98.2°F | Resp 18

## 2013-06-16 DIAGNOSIS — Z5189 Encounter for other specified aftercare: Secondary | ICD-10-CM

## 2013-06-16 DIAGNOSIS — IMO0002 Reserved for concepts with insufficient information to code with codable children: Secondary | ICD-10-CM

## 2013-06-16 DIAGNOSIS — C8589 Other specified types of non-Hodgkin lymphoma, extranodal and solid organ sites: Secondary | ICD-10-CM | POA: Insufficient documentation

## 2013-06-16 MED ORDER — PEGFILGRASTIM INJECTION 6 MG/0.6ML
SUBCUTANEOUS | Status: AC
  Filled 2013-06-16: qty 0.6

## 2013-06-16 MED ORDER — PEGFILGRASTIM INJECTION 6 MG/0.6ML
6.0000 mg | Freq: Once | SUBCUTANEOUS | Status: AC
  Administered 2013-06-16: 6 mg via SUBCUTANEOUS

## 2013-06-16 NOTE — Telephone Encounter (Signed)
Megan Mcknight says she is doing well.  Denies any symptoms.  She is eating well and drinking lots of fluids.  Denies any changes in urine or bowels.  Has not yet been told who her new oncologist will be so asked at this time.  Next f/u is 07-04-2013 and informed her A covering provider will start on 06-20-2013 and another the end of July.  One of which will be assigned to her.

## 2013-06-16 NOTE — Progress Notes (Signed)
Megan Mcknight presents today for injection per MD orders. Neulasta 6mg  administered SQ in right Abdomen. Administration without incident. Patient tolerated well.

## 2013-06-16 NOTE — Telephone Encounter (Signed)
Message copied by Augusto Garbe on Thu Jun 16, 2013  4:37 PM ------      Message from: Barbara Cower      Created: Mon Jun 13, 2013  1:22 PM      Regarding: chemo callback       CHOP/VP16            1st one at [facility]- 1st cycle given inpatient            [place] ------

## 2013-06-20 ENCOUNTER — Encounter (HOSPITAL_BASED_OUTPATIENT_CLINIC_OR_DEPARTMENT_OTHER): Payer: PRIVATE HEALTH INSURANCE

## 2013-06-20 DIAGNOSIS — C8589 Other specified types of non-Hodgkin lymphoma, extranodal and solid organ sites: Secondary | ICD-10-CM

## 2013-06-20 LAB — CBC WITH DIFFERENTIAL/PLATELET
Basophils Absolute: 0 10*3/uL (ref 0.0–0.1)
Basophils Relative: 0 % (ref 0–1)
Eosinophils Absolute: 0 10*3/uL (ref 0.0–0.7)
Eosinophils Relative: 2 % (ref 0–5)
HCT: 28.4 % — ABNORMAL LOW (ref 36.0–46.0)
Hemoglobin: 9.2 g/dL — ABNORMAL LOW (ref 12.0–15.0)
Lymphocytes Relative: 69 % — ABNORMAL HIGH (ref 12–46)
Lymphs Abs: 1.7 10*3/uL (ref 0.7–4.0)
MCH: 25.9 pg — ABNORMAL LOW (ref 26.0–34.0)
MCHC: 32.4 g/dL (ref 30.0–36.0)
MCV: 80 fL (ref 78.0–100.0)
Monocytes Absolute: 0 10*3/uL — ABNORMAL LOW (ref 0.1–1.0)
Monocytes Relative: 2 % — ABNORMAL LOW (ref 3–12)
Neutro Abs: 0.7 10*3/uL — ABNORMAL LOW (ref 1.7–7.7)
Neutrophils Relative %: 27 % — ABNORMAL LOW (ref 43–77)
Platelets: 194 10*3/uL (ref 150–400)
RBC: 3.55 MIL/uL — ABNORMAL LOW (ref 3.87–5.11)
RDW: 19.4 % — ABNORMAL HIGH (ref 11.5–15.5)
WBC: 2.4 10*3/uL — ABNORMAL LOW (ref 4.0–10.5)

## 2013-06-20 NOTE — Progress Notes (Signed)
Labs drawn today for cbc/diff 

## 2013-06-21 ENCOUNTER — Other Ambulatory Visit: Payer: Self-pay | Admitting: Medical Oncology

## 2013-06-21 NOTE — Telephone Encounter (Signed)
Pt called asking how to get her BP medications refilled. She asked if Dr.Oti is a MD here. I explained that Dr. Brien Few is the physician who saw her in the hospital. I asked  her if she has primary MD and she states does. I asked her to call her primary MD to get these refilled. She voiced understanding.

## 2013-06-26 LAB — AFB CULTURE WITH SMEAR (NOT AT ARMC)
Acid Fast Smear: NONE SEEN
Acid Fast Smear: NONE SEEN
Acid Fast Smear: NONE SEEN
Special Requests: 7

## 2013-06-27 ENCOUNTER — Encounter (HOSPITAL_BASED_OUTPATIENT_CLINIC_OR_DEPARTMENT_OTHER): Payer: PRIVATE HEALTH INSURANCE

## 2013-06-27 DIAGNOSIS — C8589 Other specified types of non-Hodgkin lymphoma, extranodal and solid organ sites: Secondary | ICD-10-CM

## 2013-06-27 LAB — CBC WITH DIFFERENTIAL/PLATELET
Basophils Absolute: 0.1 10*3/uL (ref 0.0–0.1)
Basophils Relative: 1 % (ref 0–1)
Eosinophils Absolute: 0 10*3/uL (ref 0.0–0.7)
Eosinophils Relative: 1 % (ref 0–5)
HCT: 31.1 % — ABNORMAL LOW (ref 36.0–46.0)
Hemoglobin: 9.7 g/dL — ABNORMAL LOW (ref 12.0–15.0)
Lymphocytes Relative: 24 % (ref 12–46)
Lymphs Abs: 1.7 10*3/uL (ref 0.7–4.0)
MCH: 25.7 pg — ABNORMAL LOW (ref 26.0–34.0)
MCHC: 31.2 g/dL (ref 30.0–36.0)
MCV: 82.5 fL (ref 78.0–100.0)
Monocytes Absolute: 0.5 10*3/uL (ref 0.1–1.0)
Monocytes Relative: 7 % (ref 3–12)
Neutro Abs: 4.7 10*3/uL (ref 1.7–7.7)
Neutrophils Relative %: 68 % (ref 43–77)
Platelets: 96 10*3/uL — ABNORMAL LOW (ref 150–400)
RBC: 3.77 MIL/uL — ABNORMAL LOW (ref 3.87–5.11)
RDW: 21.1 % — ABNORMAL HIGH (ref 11.5–15.5)
Smear Review: DECREASED
WBC: 6.9 10*3/uL (ref 4.0–10.5)

## 2013-06-27 NOTE — Progress Notes (Signed)
Megan Mcknight's reason for visit today are for labs as scheduled per MD orders.  Venipuncture performed with a 23 gauge butterfly needle to R Antecubital.  Megan Mcknight tolerated venipuncture well and without incident; questions were answered and patient was discharged.

## 2013-07-04 ENCOUNTER — Other Ambulatory Visit (HOSPITAL_BASED_OUTPATIENT_CLINIC_OR_DEPARTMENT_OTHER): Payer: PRIVATE HEALTH INSURANCE | Admitting: Lab

## 2013-07-04 ENCOUNTER — Ambulatory Visit (HOSPITAL_BASED_OUTPATIENT_CLINIC_OR_DEPARTMENT_OTHER): Payer: PRIVATE HEALTH INSURANCE | Admitting: Hematology and Oncology

## 2013-07-04 ENCOUNTER — Ambulatory Visit (HOSPITAL_BASED_OUTPATIENT_CLINIC_OR_DEPARTMENT_OTHER): Payer: PRIVATE HEALTH INSURANCE

## 2013-07-04 ENCOUNTER — Other Ambulatory Visit: Payer: Self-pay

## 2013-07-04 ENCOUNTER — Encounter: Payer: Self-pay | Admitting: Hematology and Oncology

## 2013-07-04 ENCOUNTER — Telehealth: Payer: Self-pay | Admitting: Hematology and Oncology

## 2013-07-04 ENCOUNTER — Other Ambulatory Visit: Payer: Self-pay | Admitting: Medical Oncology

## 2013-07-04 VITALS — BP 130/69 | HR 72 | Temp 97.8°F | Resp 18 | Ht 68.0 in | Wt 223.2 lb

## 2013-07-04 DIAGNOSIS — C8589 Other specified types of non-Hodgkin lymphoma, extranodal and solid organ sites: Secondary | ICD-10-CM

## 2013-07-04 DIAGNOSIS — IMO0002 Reserved for concepts with insufficient information to code with codable children: Secondary | ICD-10-CM

## 2013-07-04 DIAGNOSIS — Z5111 Encounter for antineoplastic chemotherapy: Secondary | ICD-10-CM

## 2013-07-04 LAB — URIC ACID (CC13): Uric Acid, Serum: 5.2 mg/dl (ref 2.6–7.4)

## 2013-07-04 LAB — LACTATE DEHYDROGENASE (CC13): LDH: 195 U/L (ref 125–245)

## 2013-07-04 LAB — COMPREHENSIVE METABOLIC PANEL (CC13)
ALT: 21 U/L (ref 0–55)
AST: 13 U/L (ref 5–34)
Albumin: 3.8 g/dL (ref 3.5–5.0)
Alkaline Phosphatase: 53 U/L (ref 40–150)
BUN: 13.1 mg/dL (ref 7.0–26.0)
CO2: 21 mEq/L — ABNORMAL LOW (ref 22–29)
Calcium: 9.7 mg/dL (ref 8.4–10.4)
Chloride: 109 mEq/L (ref 98–109)
Creatinine: 0.8 mg/dL (ref 0.6–1.1)
Glucose: 118 mg/dl (ref 70–140)
Potassium: 4 mEq/L (ref 3.5–5.1)
Sodium: 141 mEq/L (ref 136–145)
Total Bilirubin: 0.42 mg/dL (ref 0.20–1.20)
Total Protein: 6.7 g/dL (ref 6.4–8.3)

## 2013-07-04 LAB — CBC WITH DIFFERENTIAL/PLATELET
BASO%: 0.3 % (ref 0.0–2.0)
Basophils Absolute: 0 10*3/uL (ref 0.0–0.1)
EOS%: 0.8 % (ref 0.0–7.0)
Eosinophils Absolute: 0 10*3/uL (ref 0.0–0.5)
HCT: 31.5 % — ABNORMAL LOW (ref 34.8–46.6)
HGB: 10.1 g/dL — ABNORMAL LOW (ref 11.6–15.9)
LYMPH%: 35.7 % (ref 14.0–49.7)
MCH: 25.9 pg (ref 25.1–34.0)
MCHC: 32.1 g/dL (ref 31.5–36.0)
MCV: 80.7 fL (ref 79.5–101.0)
MONO#: 0.4 10*3/uL (ref 0.1–0.9)
MONO%: 7 % (ref 0.0–14.0)
NEUT#: 3.1 10*3/uL (ref 1.5–6.5)
NEUT%: 56.2 % (ref 38.4–76.8)
Platelets: 214 10*3/uL (ref 145–400)
RBC: 3.9 10*6/uL (ref 3.70–5.45)
RDW: 24.1 % — ABNORMAL HIGH (ref 11.2–14.5)
WBC: 5.6 10*3/uL (ref 3.9–10.3)
lymph#: 2 10*3/uL (ref 0.9–3.3)

## 2013-07-04 MED ORDER — SODIUM CHLORIDE 0.9 % IJ SOLN
10.0000 mL | INTRAMUSCULAR | Status: DC | PRN
  Administered 2013-07-04: 10 mL
  Filled 2013-07-04: qty 10

## 2013-07-04 MED ORDER — VINCRISTINE SULFATE CHEMO INJECTION 1 MG/ML
2.0000 mg | Freq: Once | INTRAVENOUS | Status: AC
  Administered 2013-07-04: 2 mg via INTRAVENOUS
  Filled 2013-07-04: qty 2

## 2013-07-04 MED ORDER — PANTOPRAZOLE SODIUM 40 MG PO TBEC: 40.0000 mg | DELAYED_RELEASE_TABLET | Freq: Every day | ORAL | Status: DC

## 2013-07-04 MED ORDER — SODIUM CHLORIDE 0.9 % IV SOLN
80.0000 mg/m2 | Freq: Once | INTRAVENOUS | Status: AC
  Administered 2013-07-04: 170 mg via INTRAVENOUS
  Filled 2013-07-04: qty 8.5

## 2013-07-04 MED ORDER — ONDANSETRON 16 MG/50ML IVPB (CHCC)
16.0000 mg | Freq: Once | INTRAVENOUS | Status: AC
  Administered 2013-07-04: 16 mg via INTRAVENOUS

## 2013-07-04 MED ORDER — SODIUM CHLORIDE 0.9 % IV SOLN
600.0000 mg/m2 | Freq: Once | INTRAVENOUS | Status: AC
  Administered 2013-07-04: 1300 mg via INTRAVENOUS
  Filled 2013-07-04: qty 65

## 2013-07-04 MED ORDER — DOXORUBICIN HCL CHEMO IV INJECTION 2 MG/ML
40.0000 mg/m2 | Freq: Once | INTRAVENOUS | Status: AC
  Administered 2013-07-04: 86 mg via INTRAVENOUS
  Filled 2013-07-04: qty 43

## 2013-07-04 MED ORDER — SODIUM CHLORIDE 0.9 % IV SOLN
Freq: Once | INTRAVENOUS | Status: AC
  Administered 2013-07-04: 10:00:00 via INTRAVENOUS

## 2013-07-04 MED ORDER — ALLOPURINOL 300 MG PO TABS: 300.0000 mg | ORAL_TABLET | Freq: Every day | ORAL | Status: DC

## 2013-07-04 MED ORDER — DEXAMETHASONE SODIUM PHOSPHATE 20 MG/5ML IJ SOLN
20.0000 mg | Freq: Once | INTRAMUSCULAR | Status: AC
  Administered 2013-07-04: 20 mg via INTRAVENOUS

## 2013-07-04 MED ORDER — HEPARIN SOD (PORK) LOCK FLUSH 100 UNIT/ML IV SOLN
500.0000 [IU] | Freq: Once | INTRAVENOUS | Status: AC | PRN
  Administered 2013-07-04: 500 [IU]
  Filled 2013-07-04: qty 5

## 2013-07-04 NOTE — Patient Instructions (Addendum)
[facility] Discharge Instructions for Patients Receiving Chemotherapy  Today you received the following chemotherapy agents Adriamycin, Vincristine, Cytoxan and VP-16.  To help prevent nausea and vomiting after your treatment, we encourage you to take your nausea medication.   If you develop nausea and vomiting that is not controlled by your nausea medication, call the clinic.   BELOW ARE SYMPTOMS THAT SHOULD BE REPORTED IMMEDIATELY:  *FEVER GREATER THAN 100.5 F  *CHILLS WITH OR WITHOUT FEVER  NAUSEA AND VOMITING THAT IS NOT CONTROLLED WITH YOUR NAUSEA MEDICATION  *UNUSUAL SHORTNESS OF BREATH  *UNUSUAL BRUISING OR BLEEDING  TENDERNESS IN MOUTH AND THROAT WITH OR WITHOUT PRESENCE OF ULCERS  *URINARY PROBLEMS  *BOWEL PROBLEMS  UNUSUAL RASH Items with * indicate a potential emergency and should be followed up as soon as possible.  Feel free to call the clinic you have any questions or concerns. The clinic phone number is [phone].

## 2013-07-04 NOTE — Telephone Encounter (Signed)
Per staff message and POF I have scheduled appts.  JMW  

## 2013-07-04 NOTE — Telephone Encounter (Signed)
gv and printed appt sched and avs for pt....MW added tx   °

## 2013-07-04 NOTE — Progress Notes (Signed)
Put fmla form on nurse's desk °

## 2013-07-04 NOTE — Progress Notes (Signed)
CC:   Salvatore Decent. Megan Mcknight, M.D. [name] D. Felecia Shelling, MD  PROBLEM LIST: 1. 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 Megan Mcknight, M.D. There is also involvement of the lungs with     multiple lung nodules, liver and bilateral inguinal lymph nodes,     the latter seen on PET scan from 05/30/2013.  Bone marrow on     05/18/2013 was negative.  IPI score was 2.  Histologic diagnosis     was made on 05/13/2013 by mediastinal lymph node biopsy.     Chemotherapy with Cytoxan, Adriamycin, vincristine, etoposide and     prednisone in conjunction with Neulasta was initiated on     05/20/2013. 2. Brief episode of atrial fibrillation with rapid ventricular     response in late May 2014 at time of lymphoma presentation. 3. Dyslipidemia. 4. Seasonal allergies. 5. Diverticulosis. 6. Status post hysterectomy. 7. Right-sided Port-A-Cath placed on 05/23/2013.  MEDICATIONS:  Reviewed and recorded. Current Outpatient Prescriptions  Medication Sig Dispense Refill  . albuterol (PROVENTIL HFA;VENTOLIN HFA) 108 (90 BASE) MCG/ACT inhaler Inhale 2 puffs into the lungs every 6 (six) hours as needed for wheezing.      Marland Kitchen allopurinol (ZYLOPRIM) 300 MG tablet Take 1 tablet (300 mg total) by mouth daily.  30 tablet  0  . amLODipine (NORVASC) 5 MG tablet Take 1 tablet (5 mg total) by mouth daily.  30 tablet  0  . guaiFENesin-dextromethorphan (ROBITUSSIN DM) 100-10 MG/5ML syrup Take 5 mLs by mouth every 4 (four) hours as needed for cough.  118 mL  0  . ibuprofen (ADVIL,MOTRIN) 200 MG tablet Take 400 mg by mouth every 6 (six) hours as needed for pain.      Marland Kitchen lidocaine-prilocaine (EMLA) cream Apply to port site one hour before treatment and cover with plastic wrap  1 each  3  . metoprolol tartrate (LOPRESSOR) 25 MG tablet Take 1 tablet (25 mg total) by  mouth 3 (three) times daily.  90 tablet  0  . montelukast (SINGULAIR) 10 MG tablet Take 10 mg by mouth daily as needed (Allergies).       . nystatin (MYCOSTATIN) powder Apply to affected skin folds two to three times per day.  15 g  1  . ondansetron (ZOFRAN) 8 MG tablet Take 1 tablet (8 mg total) by mouth every 12 (twelve) hours as needed for nausea.  30 tablet  2  . oxyCODONE (OXY IR/ROXICODONE) 5 MG immediate release tablet Take 1 tablet (5 mg total) by mouth every 4 (four) hours as needed.  60 tablet  0  . pantoprazole (PROTONIX) 40 MG tablet Take 1 tablet (40 mg total) by mouth daily.  30 tablet  0  . predniSONE (DELTASONE) 20 MG tablet Take 5 tablets (100 mg) daily for 5 days with each round of chemotherapy  125 tablet  1  . prochlorperazine (COMPAZINE) 10 MG tablet Take 1 tablet (10 mg total) by mouth every 6 (six) hours as needed (nausea).  30 tablet  2  . rosuvastatin (CRESTOR) 10 MG tablet Take 10 mg by mouth daily.      Marland Kitchen senna (SENOKOT) 8.6 MG TABS Take 1 tablet (8.6 mg total) by mouth at bedtime.  120 each  0   No current facility-administered medications for this visit.  SMOKING HISTORY:  The patient smoked half a pack cigarettes a day for over 20 years.  She stopped smoking in early May 2014.    HISTORY:  Megan Mcknight is a 52 year old married African American female  who had been in fairly good health up until a couple months ago when she presented with a respiratory illness characterized by fever, cough and shortness of breath.  The patient was found to have extensive pulmonary nodules on a chest CT scan from 05/10/2013.  She underwent video bronchoscopy with endobronchial ultrasound and mediastinoscopy on 05/13/2013 by Salvatore Decent. Megan Mcknight, M.D. The mediastinal biopsies showed anaplastic large-cell lymphoma, CD 30 and ALK positive. CD 20 negative.  Tumor was high-grade stage IV with an IPI score of 2.  At that point, the patient was running low-grade fever, was on  oxygen.  She underwent a bone marrow aspirate and biopsy on 05/18/2013 that was negative for lymphoma.  Chemotherapy with CHOEP regimen was started on 05/20/2013.  Dr. Vedia Pereyra reduced the doses slightly to avoid profound bone marrow suppression.  The patient received Neulasta while she was hospitalized following her treatments and then subsequently received Neupogen daily while hospitalized and then on 05/25/2013, after she was discharged from the hospital, she received Neulasta 6 mg subcu.  The patient had a Port-A-Cath placed via interventional radiology on the right side on 05/23/2013.  She was not able to get a PET scan while hospitalized but did have a PET scan on 05/30/2013.  The scan shows only uptake in bilateral inguinal lymph nodes that are not really significantly enlarged.  The patient has had a striking regression of tumor in her lungs, at least 90%.  There has also been a decrease in the liver masses.  None of those areas showed increased uptake, interestingly enough. She is S/p 2 cycles now  Clinically the patient is much improved.  She has been losing her hair. She only has some minimal dyspnea on exertion and is not using any oxygen.  She denies any pain.  She has a slight cough, again improved. There has been no fever or night sweats.  She tolerated the treatment extremely well with only some mild nausea on day 2 which was Saturday, June 7th.  There was no vomiting, mucositis or diarrhea.  The patient's energy is much improved.  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  She is still out on disability.   Blood pressure 130/69, pulse 72, temperature 97.8 F (36.6 C), temperature source Oral, resp. rate 18, height 5\' 8"  (1.727 m), weight 223 lb 3.2 oz (101.243 kg). PHYSICAL EXAMINATION:  The patient looks well.  She is 52 years old. She is in no acute distress.  There is no scleral icterus.  Mouth and pharynx  are benign.  She has some missing teeth, particular the molars.  Scalp alopecia from the chemotherapy. There is no peripheral adenopathy palpable in the neck, axillary or inguinal areas.  Heart and lungs are normal.  Breasts are not examined. There is a right-sided Port-A-Cath.  Steri-Strips are still in place. Under the breast and in the abdominal crease is a moist dermatologic condition with some desquamation.  I did not see obvious yeast but I suspect this probably is a yeast infection.  The patient stated this started about 3 or 4 days ago.  She had been on prednisone a couple weeks ago.  Abdomen is somewhat obese, nontender with no organomegaly or masses  palpable.  The patient has undergone a hysterectomy. Extremities:  No peripheral edema or clubbing.  No dorsiflexor weakness. Neurologic exam is normal.  LABORATORY DATA:   CBC    Component Value Date/Time   WBC 5.6 07/04/2013 0844   WBC 6.9 06/27/2013 0940   RBC 3.90 07/04/2013 0844   RBC 3.77* 06/27/2013 0940   HGB 10.1* 07/04/2013 0844   HGB 9.7* 06/27/2013 0940   HCT 31.5* 07/04/2013 0844   HCT 31.1* 06/27/2013 0940   PLT 214 07/04/2013 0844   PLT 96* 06/27/2013 0940   MCV 80.7 07/04/2013 0844   MCV 82.5 06/27/2013 0940   MCH 25.9 07/04/2013 0844   MCH 25.7* 06/27/2013 0940   MCHC 32.1 07/04/2013 0844   MCHC 31.2 06/27/2013 0940   RDW 24.1* 07/04/2013 0844   RDW 21.1* 06/27/2013 0940   LYMPHSABS 2.0 07/04/2013 0844   LYMPHSABS 1.7 06/27/2013 0940   MONOABS 0.4 07/04/2013 0844   MONOABS 0.5 06/27/2013 0940   EOSABS 0.0 07/04/2013 0844   EOSABS 0.0 06/27/2013 0940   BASOSABS 0.0 07/04/2013 0844   BASOSABS 0.1 06/27/2013 0940    Lab Results  Component Value Date   GLUCOSE 204* 06/13/2013   BUN 14.4 06/13/2013   CO2 24 06/13/2013   ALT 27 06/13/2013   AST 15 06/13/2013   LDH 224 06/13/2013   K 3.9 06/13/2013   CREATININE 0.8 06/13/2013    IMAGING STUDIES: 1. Chest x-ray, 2 view, from 03/09/2013 showed no active disease. 2. 2-D  echocardiogram on 05/06/2013 showed  moderate LVH with     disproportionate septal thickening.  Systolic function was     hyperdynamic.  The estimated ejection fraction was 75%.  Wall     motion was normal.  There were no regional wall motion     abnormalities. 3. Chest x-ray, 1 view, on 05/09/2013 showed diffuse micronodular     pattern with worsening density at the lung bases. 4. CT scan of the chest without IV contrast on 05/10/2013 showed     diffuse pulmonary nodules and enlarged bilateral mediastinal lymph     nodes, all concerning for metastatic disease.  There were multi     focal low attenuation lesions within the liver also concerning for     metastatic disease. 5. PET scan on 05/30/2013 showed small hypermetabolic bilateral     inguinal lymph nodes with maximum SUV of 9.8 for the right inguinal     lymph node and maximum SUV of 6.8 for the left inguinal lymph node.     There was intense hypermetabolic activity throughout the axial and     appendicular skeleton felt to be due to either marrow involvement     by lymphoma or pharmacologic marrow hyperstimulation.  There were     numerous diffuse tiny pulmonary nodules that showed no     hypermetabolic activity but may have been below the PET size     threshold for detection of malignancy.   IMPRESSION AND PLAN:  Megan Mcknight has had an excellent clinical and scan response to her first cycle of chemotherapy. The patient is scheduled for her third cycle of chemotherapy today July 21st, Neulasta should be given on day 4, which will be July 24th. We anticipate 6 cycles of chemotherapy and hopefully the patient will achieve a complete remission which will be durable and curative for this potentially curable lymphoma.  Thus far, the patient has done extremely well with her treatment and has had an excellent response.  Plan her cycle 4 on 07/25/2013 as well as clinic visit  Zachery Dakins, MD 07/04/2013 9:45 AM

## 2013-07-05 ENCOUNTER — Ambulatory Visit (HOSPITAL_BASED_OUTPATIENT_CLINIC_OR_DEPARTMENT_OTHER): Payer: PRIVATE HEALTH INSURANCE

## 2013-07-05 ENCOUNTER — Encounter: Payer: Self-pay | Admitting: Hematology and Oncology

## 2013-07-05 VITALS — BP 119/69 | HR 71 | Temp 98.4°F | Resp 18

## 2013-07-05 DIAGNOSIS — Z5111 Encounter for antineoplastic chemotherapy: Secondary | ICD-10-CM

## 2013-07-05 DIAGNOSIS — IMO0002 Reserved for concepts with insufficient information to code with codable children: Secondary | ICD-10-CM

## 2013-07-05 DIAGNOSIS — C8589 Other specified types of non-Hodgkin lymphoma, extranodal and solid organ sites: Secondary | ICD-10-CM

## 2013-07-05 MED ORDER — SODIUM CHLORIDE 0.9 % IV SOLN
80.0000 mg/m2 | Freq: Once | INTRAVENOUS | Status: AC
  Administered 2013-07-05: 170 mg via INTRAVENOUS
  Filled 2013-07-05: qty 8.5

## 2013-07-05 MED ORDER — DEXAMETHASONE SODIUM PHOSPHATE 20 MG/5ML IJ SOLN
20.0000 mg | Freq: Once | INTRAMUSCULAR | Status: AC
  Administered 2013-07-05: 20 mg via INTRAVENOUS

## 2013-07-05 MED ORDER — SODIUM CHLORIDE 0.9 % IJ SOLN
10.0000 mL | INTRAMUSCULAR | Status: DC | PRN
  Administered 2013-07-05: 10 mL via INTRAVENOUS
  Filled 2013-07-05: qty 10

## 2013-07-05 MED ORDER — ONDANSETRON 16 MG/50ML IVPB (CHCC)
16.0000 mg | Freq: Once | INTRAVENOUS | Status: AC
  Administered 2013-07-05: 16 mg via INTRAVENOUS

## 2013-07-05 MED ORDER — SODIUM CHLORIDE 0.9 % IV SOLN
Freq: Once | INTRAVENOUS | Status: AC
  Administered 2013-07-05: 14:00:00 via INTRAVENOUS

## 2013-07-05 MED ORDER — HEPARIN SOD (PORK) LOCK FLUSH 100 UNIT/ML IV SOLN
500.0000 [IU] | Freq: Once | INTRAVENOUS | Status: AC
  Administered 2013-07-05: 500 [IU] via INTRAVENOUS
  Filled 2013-07-05: qty 5

## 2013-07-05 NOTE — Progress Notes (Signed)
Put fmla form in registration desk °

## 2013-07-05 NOTE — Patient Instructions (Addendum)
[facility] Discharge Instructions for Patients Receiving Chemotherapy  Today you received the following chemotherapy agents: Etoposide   To help prevent nausea and vomiting after your treatment, we encourage you to take your nausea medication as directed by your physician   If you develop nausea and vomiting that is not controlled by your nausea medication, call the clinic.   BELOW ARE SYMPTOMS THAT SHOULD BE REPORTED IMMEDIATELY:  *FEVER GREATER THAN 100.5 F  *CHILLS WITH OR WITHOUT FEVER  NAUSEA AND VOMITING THAT IS NOT CONTROLLED WITH YOUR NAUSEA MEDICATION  *UNUSUAL SHORTNESS OF BREATH  *UNUSUAL BRUISING OR BLEEDING  TENDERNESS IN MOUTH AND THROAT WITH OR WITHOUT PRESENCE OF ULCERS  *URINARY PROBLEMS  *BOWEL PROBLEMS  UNUSUAL RASH Items with * indicate a potential emergency and should be followed up as soon as possible.  Feel free to call the clinic you have any questions or concerns. The clinic phone number is [phone].    

## 2013-07-06 ENCOUNTER — Ambulatory Visit (HOSPITAL_BASED_OUTPATIENT_CLINIC_OR_DEPARTMENT_OTHER): Payer: PRIVATE HEALTH INSURANCE

## 2013-07-06 DIAGNOSIS — IMO0002 Reserved for concepts with insufficient information to code with codable children: Secondary | ICD-10-CM

## 2013-07-06 DIAGNOSIS — C8589 Other specified types of non-Hodgkin lymphoma, extranodal and solid organ sites: Secondary | ICD-10-CM

## 2013-07-06 DIAGNOSIS — Z5111 Encounter for antineoplastic chemotherapy: Secondary | ICD-10-CM

## 2013-07-06 DIAGNOSIS — Z95828 Presence of other vascular implants and grafts: Secondary | ICD-10-CM

## 2013-07-06 MED ORDER — SODIUM CHLORIDE 0.9 % IJ SOLN
10.0000 mL | INTRAMUSCULAR | Status: DC | PRN
  Administered 2013-07-06: 10 mL via INTRAVENOUS
  Filled 2013-07-06: qty 10

## 2013-07-06 MED ORDER — ONDANSETRON 16 MG/50ML IVPB (CHCC)
16.0000 mg | Freq: Once | INTRAVENOUS | Status: AC
  Administered 2013-07-06: 16 mg via INTRAVENOUS

## 2013-07-06 MED ORDER — SODIUM CHLORIDE 0.9 % IV SOLN
Freq: Once | INTRAVENOUS | Status: AC
  Administered 2013-07-06: 14:00:00 via INTRAVENOUS

## 2013-07-06 MED ORDER — HEPARIN SOD (PORK) LOCK FLUSH 100 UNIT/ML IV SOLN
500.0000 [IU] | Freq: Once | INTRAVENOUS | Status: AC
  Administered 2013-07-06: 500 [IU] via INTRAVENOUS
  Filled 2013-07-06: qty 5

## 2013-07-06 MED ORDER — SODIUM CHLORIDE 0.9 % IV SOLN
80.0000 mg/m2 | Freq: Once | INTRAVENOUS | Status: AC
  Administered 2013-07-06: 170 mg via INTRAVENOUS
  Filled 2013-07-06: qty 8.5

## 2013-07-06 MED ORDER — DEXAMETHASONE SODIUM PHOSPHATE 20 MG/5ML IJ SOLN
20.0000 mg | Freq: Once | INTRAMUSCULAR | Status: AC
  Administered 2013-07-06: 20 mg via INTRAVENOUS

## 2013-07-06 NOTE — Patient Instructions (Signed)
[facility] Discharge Instructions for Patients Receiving Chemotherapy  Today you received the following chemotherapy agents: Etoposide. To help prevent nausea and vomiting after your treatment, we encourage you to take your nausea medication, Zofran. Take it twice daily for 2 days. Begin the morning of 7/24. Use Compazine every six hours as needed for nausea. (May make you drowsy.)   If you develop nausea and vomiting that is not controlled by your nausea medication, call the clinic.   BELOW ARE SYMPTOMS THAT SHOULD BE REPORTED IMMEDIATELY:  *FEVER GREATER THAN 100.5 F  *CHILLS WITH OR WITHOUT FEVER  NAUSEA AND VOMITING THAT IS NOT CONTROLLED WITH YOUR NAUSEA MEDICATION  *UNUSUAL SHORTNESS OF BREATH  *UNUSUAL BRUISING OR BLEEDING  TENDERNESS IN MOUTH AND THROAT WITH OR WITHOUT PRESENCE OF ULCERS  *URINARY PROBLEMS  *BOWEL PROBLEMS  UNUSUAL RASH Items with * indicate a potential emergency and should be followed up as soon as possible.  Feel free to call the clinicshould you have any questions or concerns. The clinic phone number is [phone].

## 2013-07-07 ENCOUNTER — Encounter (HOSPITAL_BASED_OUTPATIENT_CLINIC_OR_DEPARTMENT_OTHER): Payer: PRIVATE HEALTH INSURANCE

## 2013-07-07 VITALS — BP 139/80 | HR 52 | Temp 98.1°F | Resp 18

## 2013-07-07 DIAGNOSIS — Z5189 Encounter for other specified aftercare: Secondary | ICD-10-CM

## 2013-07-07 DIAGNOSIS — IMO0002 Reserved for concepts with insufficient information to code with codable children: Secondary | ICD-10-CM

## 2013-07-07 DIAGNOSIS — C8589 Other specified types of non-Hodgkin lymphoma, extranodal and solid organ sites: Secondary | ICD-10-CM

## 2013-07-07 MED ORDER — PEGFILGRASTIM INJECTION 6 MG/0.6ML
6.0000 mg | Freq: Once | SUBCUTANEOUS | Status: AC
  Administered 2013-07-07: 6 mg via SUBCUTANEOUS

## 2013-07-07 MED ORDER — PEGFILGRASTIM INJECTION 6 MG/0.6ML
SUBCUTANEOUS | Status: AC
  Filled 2013-07-07: qty 0.6

## 2013-07-07 NOTE — Progress Notes (Signed)
Megan Mcknight presents today for injection per MD orders. Neulasta 6mg  administered SQ in left Abdomen. Administration without incident. Patient tolerated well.

## 2013-07-08 ENCOUNTER — Other Ambulatory Visit (HOSPITAL_COMMUNITY): Payer: PRIVATE HEALTH INSURANCE

## 2013-07-14 ENCOUNTER — Encounter: Payer: Self-pay | Admitting: *Deleted

## 2013-07-15 ENCOUNTER — Encounter (HOSPITAL_COMMUNITY): Payer: PRIVATE HEALTH INSURANCE | Attending: Oncology

## 2013-07-15 DIAGNOSIS — C8589 Other specified types of non-Hodgkin lymphoma, extranodal and solid organ sites: Secondary | ICD-10-CM | POA: Insufficient documentation

## 2013-07-15 LAB — CBC WITH DIFFERENTIAL/PLATELET
Band Neutrophils: 0 % (ref 0–10)
Basophils Absolute: 0 10*3/uL (ref 0.0–0.1)
Basophils Relative: 0 % (ref 0–1)
Blasts: 0 %
Eosinophils Absolute: 0 10*3/uL (ref 0.0–0.7)
Eosinophils Relative: 0 % (ref 0–5)
HCT: 29.6 % — ABNORMAL LOW (ref 36.0–46.0)
Hemoglobin: 9.4 g/dL — ABNORMAL LOW (ref 12.0–15.0)
Lymphocytes Relative: 18 % (ref 12–46)
Lymphs Abs: 3.6 10*3/uL (ref 0.7–4.0)
MCH: 26.6 pg (ref 26.0–34.0)
MCHC: 31.8 g/dL (ref 30.0–36.0)
MCV: 83.6 fL (ref 78.0–100.0)
Metamyelocytes Relative: 0 %
Monocytes Absolute: 1 10*3/uL (ref 0.1–1.0)
Monocytes Relative: 5 % (ref 3–12)
Myelocytes: 0 %
Neutro Abs: 15.3 10*3/uL — ABNORMAL HIGH (ref 1.7–7.7)
Neutrophils Relative %: 77 % (ref 43–77)
Platelets: 104 10*3/uL — ABNORMAL LOW (ref 150–400)
Promyelocytes Absolute: 0 %
RBC: 3.54 MIL/uL — ABNORMAL LOW (ref 3.87–5.11)
RDW: 21.8 % — ABNORMAL HIGH (ref 11.5–15.5)
Smear Review: DECREASED
WBC: 19.9 10*3/uL — ABNORMAL HIGH (ref 4.0–10.5)
nRBC: 0 /100 WBC

## 2013-07-15 NOTE — Progress Notes (Signed)
Labs drawn today for cbc/diff 

## 2013-07-22 ENCOUNTER — Encounter (HOSPITAL_BASED_OUTPATIENT_CLINIC_OR_DEPARTMENT_OTHER): Payer: PRIVATE HEALTH INSURANCE

## 2013-07-22 DIAGNOSIS — C8589 Other specified types of non-Hodgkin lymphoma, extranodal and solid organ sites: Secondary | ICD-10-CM

## 2013-07-22 LAB — CBC WITH DIFFERENTIAL/PLATELET
Basophils Absolute: 0.1 10*3/uL (ref 0.0–0.1)
Basophils Relative: 2 % — ABNORMAL HIGH (ref 0–1)
Eosinophils Absolute: 0 10*3/uL (ref 0.0–0.7)
Eosinophils Relative: 0 % (ref 0–5)
HCT: 30.7 % — ABNORMAL LOW (ref 36.0–46.0)
Hemoglobin: 9.6 g/dL — ABNORMAL LOW (ref 12.0–15.0)
Lymphocytes Relative: 44 % (ref 12–46)
Lymphs Abs: 1.7 10*3/uL (ref 0.7–4.0)
MCH: 26.3 pg (ref 26.0–34.0)
MCHC: 31.3 g/dL (ref 30.0–36.0)
MCV: 84.1 fL (ref 78.0–100.0)
Monocytes Absolute: 0.3 10*3/uL (ref 0.1–1.0)
Monocytes Relative: 8 % (ref 3–12)
Neutro Abs: 1.8 10*3/uL (ref 1.7–7.7)
Neutrophils Relative %: 46 % (ref 43–77)
Platelets: 159 10*3/uL (ref 150–400)
RBC: 3.65 MIL/uL — ABNORMAL LOW (ref 3.87–5.11)
RDW: 21.5 % — ABNORMAL HIGH (ref 11.5–15.5)
WBC: 3.9 10*3/uL — ABNORMAL LOW (ref 4.0–10.5)

## 2013-07-22 NOTE — Progress Notes (Signed)
Labs drawn today for cbc/diff 

## 2013-07-25 ENCOUNTER — Ambulatory Visit (HOSPITAL_BASED_OUTPATIENT_CLINIC_OR_DEPARTMENT_OTHER): Payer: PRIVATE HEALTH INSURANCE | Admitting: Hematology and Oncology

## 2013-07-25 ENCOUNTER — Other Ambulatory Visit (HOSPITAL_BASED_OUTPATIENT_CLINIC_OR_DEPARTMENT_OTHER): Payer: PRIVATE HEALTH INSURANCE | Admitting: Lab

## 2013-07-25 ENCOUNTER — Ambulatory Visit (HOSPITAL_BASED_OUTPATIENT_CLINIC_OR_DEPARTMENT_OTHER): Payer: PRIVATE HEALTH INSURANCE

## 2013-07-25 ENCOUNTER — Telehealth: Payer: Self-pay | Admitting: *Deleted

## 2013-07-25 ENCOUNTER — Other Ambulatory Visit: Payer: Self-pay | Admitting: Medical Oncology

## 2013-07-25 VITALS — BP 132/81 | HR 64 | Temp 98.2°F | Resp 18 | Ht 68.0 in | Wt 228.7 lb

## 2013-07-25 DIAGNOSIS — C8589 Other specified types of non-Hodgkin lymphoma, extranodal and solid organ sites: Secondary | ICD-10-CM

## 2013-07-25 DIAGNOSIS — Z5111 Encounter for antineoplastic chemotherapy: Secondary | ICD-10-CM

## 2013-07-25 DIAGNOSIS — IMO0002 Reserved for concepts with insufficient information to code with codable children: Secondary | ICD-10-CM

## 2013-07-25 DIAGNOSIS — D649 Anemia, unspecified: Secondary | ICD-10-CM

## 2013-07-25 LAB — COMPREHENSIVE METABOLIC PANEL (CC13)
ALT: 29 U/L (ref 0–55)
AST: 20 U/L (ref 5–34)
Albumin: 3.6 g/dL (ref 3.5–5.0)
Alkaline Phosphatase: 57 U/L (ref 40–150)
BUN: 13.6 mg/dL (ref 7.0–26.0)
CO2: 22 mEq/L (ref 22–29)
Calcium: 9.2 mg/dL (ref 8.4–10.4)
Chloride: 110 mEq/L — ABNORMAL HIGH (ref 98–109)
Creatinine: 0.9 mg/dL (ref 0.6–1.1)
Glucose: 269 mg/dl — ABNORMAL HIGH (ref 70–140)
Potassium: 4 mEq/L (ref 3.5–5.1)
Sodium: 142 mEq/L (ref 136–145)
Total Bilirubin: 0.32 mg/dL (ref 0.20–1.20)
Total Protein: 6.4 g/dL (ref 6.4–8.3)

## 2013-07-25 LAB — CBC WITH DIFFERENTIAL/PLATELET
BASO%: 1.2 % (ref 0.0–2.0)
Basophils Absolute: 0.1 10*3/uL (ref 0.0–0.1)
EOS%: 0.9 % (ref 0.0–7.0)
Eosinophils Absolute: 0.1 10*3/uL (ref 0.0–0.5)
HCT: 30.6 % — ABNORMAL LOW (ref 34.8–46.6)
HGB: 9.6 g/dL — ABNORMAL LOW (ref 11.6–15.9)
LYMPH%: 38.3 % (ref 14.0–49.7)
MCH: 26.5 pg (ref 25.1–34.0)
MCHC: 31.4 g/dL — ABNORMAL LOW (ref 31.5–36.0)
MCV: 84.5 fL (ref 79.5–101.0)
MONO#: 0.5 10*3/uL (ref 0.1–0.9)
MONO%: 9.2 % (ref 0.0–14.0)
NEUT#: 2.9 10*3/uL (ref 1.5–6.5)
NEUT%: 50.4 % (ref 38.4–76.8)
Platelets: 192 10*3/uL (ref 145–400)
RBC: 3.62 10*6/uL — ABNORMAL LOW (ref 3.70–5.45)
RDW: 22.3 % — ABNORMAL HIGH (ref 11.2–14.5)
WBC: 5.8 10*3/uL (ref 3.9–10.3)
lymph#: 2.2 10*3/uL (ref 0.9–3.3)

## 2013-07-25 LAB — LACTATE DEHYDROGENASE (CC13): LDH: 221 U/L (ref 125–245)

## 2013-07-25 MED ORDER — ETOPOSIDE CHEMO INJECTION 1 GM/50ML
80.0000 mg/m2 | Freq: Once | INTRAVENOUS | Status: AC
  Administered 2013-07-25: 170 mg via INTRAVENOUS
  Filled 2013-07-25: qty 8.5

## 2013-07-25 MED ORDER — SODIUM CHLORIDE 0.9 % IV SOLN
600.0000 mg/m2 | Freq: Once | INTRAVENOUS | Status: AC
  Administered 2013-07-25: 1300 mg via INTRAVENOUS
  Filled 2013-07-25: qty 65

## 2013-07-25 MED ORDER — HEPARIN SOD (PORK) LOCK FLUSH 100 UNIT/ML IV SOLN
500.0000 [IU] | Freq: Once | INTRAVENOUS | Status: AC | PRN
  Administered 2013-07-25: 500 [IU]
  Filled 2013-07-25: qty 5

## 2013-07-25 MED ORDER — DEXAMETHASONE SODIUM PHOSPHATE 20 MG/5ML IJ SOLN
20.0000 mg | Freq: Once | INTRAMUSCULAR | Status: AC
  Administered 2013-07-25: 20 mg via INTRAVENOUS

## 2013-07-25 MED ORDER — SODIUM CHLORIDE 0.9 % IJ SOLN
10.0000 mL | INTRAMUSCULAR | Status: DC | PRN
  Administered 2013-07-25: 10 mL
  Filled 2013-07-25: qty 10

## 2013-07-25 MED ORDER — SODIUM CHLORIDE 0.9 % IV SOLN
Freq: Once | INTRAVENOUS | Status: AC
  Administered 2013-07-25: 10:00:00 via INTRAVENOUS

## 2013-07-25 MED ORDER — DOXORUBICIN HCL CHEMO IV INJECTION 2 MG/ML
40.0000 mg/m2 | Freq: Once | INTRAVENOUS | Status: AC
  Administered 2013-07-25: 86 mg via INTRAVENOUS
  Filled 2013-07-25: qty 43

## 2013-07-25 MED ORDER — ONDANSETRON 16 MG/50ML IVPB (CHCC)
16.0000 mg | Freq: Once | INTRAVENOUS | Status: AC
  Administered 2013-07-25: 16 mg via INTRAVENOUS

## 2013-07-25 MED ORDER — VINCRISTINE SULFATE CHEMO INJECTION 1 MG/ML
2.0000 mg | Freq: Once | INTRAVENOUS | Status: AC
  Administered 2013-07-25: 2 mg via INTRAVENOUS
  Filled 2013-07-25: qty 2

## 2013-07-25 NOTE — Progress Notes (Signed)
CC:   Salvatore Decent. Dorris Fetch, M.D. [name] D. Felecia Shelling, MD  PROBLEM LIST: 1. 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 Dorris Fetch, M.D. There is also involvement of the lungs with     multiple lung nodules, liver and bilateral inguinal lymph nodes,     the latter seen on PET scan from 05/30/2013.  Bone marrow on     05/18/2013 was negative.  IPI score was 2.  Histologic diagnosis     was made on 05/13/2013 by mediastinal lymph node biopsy.     Chemotherapy with Cytoxan, Adriamycin, vincristine, etoposide and     prednisone in conjunction with Neulasta was initiated on     05/20/2013. 2. Brief episode of atrial fibrillation with rapid ventricular     response in late May 2014 at time of lymphoma presentation. 3. Dyslipidemia. 4. Seasonal allergies. 5. Diverticulosis. 6. Status post hysterectomy. 7. Right-sided Port-A-Cath placed on 05/23/2013.  MEDICATIONS:  Reviewed and recorded. Current Outpatient Prescriptions  Medication Sig Dispense Refill  . albuterol (PROVENTIL HFA;VENTOLIN HFA) 108 (90 BASE) MCG/ACT inhaler Inhale 2 puffs into the lungs every 6 (six) hours as needed for wheezing.      Marland Kitchen allopurinol (ZYLOPRIM) 300 MG tablet Take 1 tablet (300 mg total) by mouth daily.  30 tablet  1  . amLODipine (NORVASC) 5 MG tablet Take 1 tablet (5 mg total) by mouth daily.  30 tablet  0  . guaiFENesin-dextromethorphan (ROBITUSSIN DM) 100-10 MG/5ML syrup Take 5 mLs by mouth every 4 (four) hours as needed for cough.  118 mL  0  . ibuprofen (ADVIL,MOTRIN) 200 MG tablet Take 400 mg by mouth every 6 (six) hours as needed for pain.      Marland Kitchen lidocaine-prilocaine (EMLA) cream Apply to port site one hour before treatment and cover with plastic wrap  1 each  3  . metoprolol tartrate (LOPRESSOR) 25 MG tablet Take 1 tablet (25 mg total) by  mouth 3 (three) times daily.  90 tablet  0  . montelukast (SINGULAIR) 10 MG tablet Take 10 mg by mouth daily as needed (Allergies).       . nystatin (MYCOSTATIN) powder Apply to affected skin folds two to three times per day.  15 g  1  . ondansetron (ZOFRAN) 8 MG tablet Take 1 tablet (8 mg total) by mouth every 12 (twelve) hours as needed for nausea.  30 tablet  2  . oxyCODONE (OXY IR/ROXICODONE) 5 MG immediate release tablet Take 1 tablet (5 mg total) by mouth every 4 (four) hours as needed.  60 tablet  0  . pantoprazole (PROTONIX) 40 MG tablet Take 1 tablet (40 mg total) by mouth daily.  30 tablet  1  . predniSONE (DELTASONE) 20 MG tablet Take 5 tablets (100 mg) daily for 5 days with each round of chemotherapy  125 tablet  1  . prochlorperazine (COMPAZINE) 10 MG tablet Take 1 tablet (10 mg total) by mouth every 6 (six) hours as needed (nausea).  30 tablet  2  . rosuvastatin (CRESTOR) 10 MG tablet Take 10 mg by mouth daily.      Marland Kitchen senna (SENOKOT) 8.6 MG TABS Take 1 tablet (8.6 mg total) by mouth at bedtime.  120 each  0   No current facility-administered medications for this visit.  SMOKING HISTORY:  The patient smoked half a pack cigarettes a day for over 20 years.  She stopped smoking in early May 2014.    HISTORY:  Megan Mcknight is a 52 year old married African American female  who had been in fairly good health up until a couple months ago when she presented with a respiratory illness characterized by fever, cough and shortness of breath.  The patient was found to have extensive pulmonary nodules on a chest CT scan from 05/10/2013.  She underwent video bronchoscopy with endobronchial ultrasound and mediastinoscopy on 05/13/2013 by Salvatore Decent. Dorris Fetch, M.D. The mediastinal biopsies showed anaplastic large-cell lymphoma, CD 30 and ALK positive. CD 20 negative.  Tumor was high-grade stage IV with an IPI score of 2.  At that point, the patient was running low-grade fever, was on  oxygen.  She underwent a bone marrow aspirate and biopsy on 05/18/2013 that was negative for lymphoma.  Chemotherapy with CHOEP regimen was started on 05/20/2013.  Dr. Vedia Pereyra reduced the doses slightly to avoid profound bone marrow suppression.  The patient received Neulasta while she was hospitalized following her treatments and then subsequently received Neupogen daily while hospitalized and then on 05/25/2013, after she was discharged from the hospital, she received Neulasta 6 mg subcu.  The patient had a Port-A-Cath placed via interventional radiology on the right side on 05/23/2013.  She was not able to get a PET scan while hospitalized but did have a PET scan on 05/30/2013.  The scan shows only uptake in bilateral inguinal lymph nodes that are not really significantly enlarged.  The patient has had a striking regression of tumor in her lungs, at least 90%.  There has also been a decrease in the liver masses.  None of those areas showed increased uptake, interestingly enough. She is S/p 3 cycles now.  Clinically the patient is much improved.  She has been losing her hair. She only has some minimal dyspnea on exertion and is not using any oxygen.  She denies any pain.  She has a slight cough, again improved. There has been no fever or night sweats.  She tolerated the treatment extremely well with only some mild nausea on day 2.  There was no vomiting, mucositis or diarrhea.   The patient's energy is much improved.    Blood pressure 132/81, pulse 64, temperature 98.2 F (36.8 C), temperature source Oral, resp. rate 18, height 5\' 8"  (1.727 m), weight 228 lb 11.2 oz (103.738 kg). PHYSICAL EXAMINATION:  The patient looks well.  She is 52 years old. She is in no acute distress.  There is no scleral icterus.  Mouth and pharynx are benign.  She has some missing teeth, particular the molars.  Scalp alopecia from the chemotherapy. There is no peripheral adenopathy palpable in the neck,  axillary or inguinal areas.  Heart and lungs are normal.  Breasts are not examined. There is a right-sided Port-A-Cath.  Steri-Strips are still in place. Under the breast and in the abdominal crease is a moist dermatologic condition with some desquamation.  I did not see obvious yeast but I suspect this probably is a yeast infection.  The patient stated this started about 3 or 4 days ago.  She had been on prednisone a couple weeks ago.  Abdomen is somewhat obese, nontender with no organomegaly or masses palpable.  The patient has undergone a hysterectomy. Extremities:  No peripheral edema or clubbing.  No dorsiflexor weakness. Neurologic exam is normal.  LABORATORY DATA:   CBC  Component Value Date/Time   WBC 5.8 07/25/2013 0809   WBC 3.9* 07/22/2013 0935   RBC 3.62* 07/25/2013 0809   RBC 3.65* 07/22/2013 0935   HGB 9.6* 07/25/2013 0809   HGB 9.6* 07/22/2013 0935   HCT 30.6* 07/25/2013 0809   HCT 30.7* 07/22/2013 0935   PLT 192 07/25/2013 0809   PLT 159 07/22/2013 0935   MCV 84.5 07/25/2013 0809   MCV 84.1 07/22/2013 0935   MCH 26.5 07/25/2013 0809   MCH 26.3 07/22/2013 0935   MCHC 31.4* 07/25/2013 0809   MCHC 31.3 07/22/2013 0935   RDW 22.3* 07/25/2013 0809   RDW 21.5* 07/22/2013 0935   LYMPHSABS 2.2 07/25/2013 0809   LYMPHSABS 1.7 07/22/2013 0935   MONOABS 0.5 07/25/2013 0809   MONOABS 0.3 07/22/2013 0935   EOSABS 0.1 07/25/2013 0809   EOSABS 0.0 07/22/2013 0935   BASOSABS 0.1 07/25/2013 0809   BASOSABS 0.1 07/22/2013 0935    Lab Results  Component Value Date   GLUCOSE 118 07/04/2013   BUN 13.1 07/04/2013   CO2 21* 07/04/2013   ALT 21 07/04/2013   AST 13 07/04/2013   LDH 195 07/04/2013   K 4.0 07/04/2013   CREATININE 0.8 07/04/2013    IMAGING STUDIES: 1. Chest x-ray, 2 view, from 03/09/2013 showed no active disease. 2. 2-D echocardiogram on 05/06/2013 showed  moderate LVH with     disproportionate septal thickening.  Systolic function was     hyperdynamic.  The estimated ejection fraction was 75%.   Wall     motion was normal.  There were no regional wall motion     abnormalities. 3. Chest x-ray, 1 view, on 05/09/2013 showed diffuse micronodular     pattern with worsening density at the lung bases. 4. CT scan of the chest without IV contrast on 05/10/2013 showed     diffuse pulmonary nodules and enlarged bilateral mediastinal lymph     nodes, all concerning for metastatic disease.  There were multi     focal low attenuation lesions within the liver also concerning for     metastatic disease. 5. PET scan on 05/30/2013 showed small hypermetabolic bilateral     inguinal lymph nodes with maximum SUV of 9.8 for the right inguinal     lymph node and maximum SUV of 6.8 for the left inguinal lymph node.     There was intense hypermetabolic activity throughout the axial and     appendicular skeleton felt to be due to either marrow involvement     by lymphoma or pharmacologic marrow hyperstimulation.  There were     numerous diffuse tiny pulmonary nodules that showed no     hypermetabolic activity but may have been below the PET size     threshold for detection of malignancy.   IMPRESSION AND PLAN:  Megan Mcknight has had an excellent clinical response to her first 3 cycle of chemotherapy. The patient is scheduled for her 4th cycle of chemotherapy today 07/25/2013, Neulasta should be given on day 4, which will be 07/28/2013 at [facility] ppen. We anticipate 6 cycles of chemotherapy and hopefully the patient will achieve a complete remission which will be durable and curative for this potentially curable lymphoma.  Thus far, the patient has done extremely well with her treatment and has had an excellent response.  Plan her cycle 5 on 08/15/2013 as well as clinic visit and cycle #6 on September 05, 2013. We will obtain PET scan prior to her next visit and treatment.  Megan Mcknight,  Karter Haire E, MD 07/25/2013 9:20 AM

## 2013-07-25 NOTE — Telephone Encounter (Signed)
Per staff phone call and POF I have schedueld appts.  JMW  

## 2013-07-25 NOTE — Patient Instructions (Addendum)
[facility] Discharge Instructions for Patients Receiving Chemotherapy  Today you received the following chemotherapy agents: Adriamycin, Vincristine, Cytoxan, Etoposide  To help prevent nausea and vomiting after your treatment, we encourage you to take your nausea medication as directed by your physician.   If you develop nausea and vomiting that is not controlled by your nausea medication, call the clinic.   BELOW ARE SYMPTOMS THAT SHOULD BE REPORTED IMMEDIATELY:  *FEVER GREATER THAN 100.5 F  *CHILLS WITH OR WITHOUT FEVER  NAUSEA AND VOMITING THAT IS NOT CONTROLLED WITH YOUR NAUSEA MEDICATION  *UNUSUAL SHORTNESS OF BREATH  *UNUSUAL BRUISING OR BLEEDING  TENDERNESS IN MOUTH AND THROAT WITH OR WITHOUT PRESENCE OF ULCERS  *URINARY PROBLEMS  *BOWEL PROBLEMS  UNUSUAL RASH Items with * indicate a potential emergency and should be followed up as soon as possible.  Feel free to call the clinic you have any questions or concerns. The clinic phone number is [phone].

## 2013-07-26 ENCOUNTER — Ambulatory Visit: Payer: PRIVATE HEALTH INSURANCE

## 2013-07-26 ENCOUNTER — Ambulatory Visit (HOSPITAL_BASED_OUTPATIENT_CLINIC_OR_DEPARTMENT_OTHER): Payer: PRIVATE HEALTH INSURANCE

## 2013-07-26 VITALS — BP 112/70 | HR 64 | Temp 98.8°F | Resp 20

## 2013-07-26 DIAGNOSIS — Z5111 Encounter for antineoplastic chemotherapy: Secondary | ICD-10-CM

## 2013-07-26 DIAGNOSIS — C8589 Other specified types of non-Hodgkin lymphoma, extranodal and solid organ sites: Secondary | ICD-10-CM

## 2013-07-26 MED ORDER — SODIUM CHLORIDE 0.9 % IV SOLN
Freq: Once | INTRAVENOUS | Status: AC
  Administered 2013-07-26: 15:00:00 via INTRAVENOUS

## 2013-07-26 MED ORDER — DEXAMETHASONE SODIUM PHOSPHATE 20 MG/5ML IJ SOLN
20.0000 mg | Freq: Once | INTRAMUSCULAR | Status: AC
  Administered 2013-07-26: 20 mg via INTRAVENOUS

## 2013-07-26 MED ORDER — SODIUM CHLORIDE 0.9 % IV SOLN
80.0000 mg/m2 | Freq: Once | INTRAVENOUS | Status: AC
  Administered 2013-07-26: 170 mg via INTRAVENOUS
  Filled 2013-07-26: qty 8.5

## 2013-07-26 MED ORDER — ONDANSETRON 16 MG/50ML IVPB (CHCC)
16.0000 mg | Freq: Once | INTRAVENOUS | Status: AC
  Administered 2013-07-26: 16 mg via INTRAVENOUS

## 2013-07-26 NOTE — Patient Instructions (Addendum)
[facility] Discharge Instructions for Patients Receiving Chemotherapy  Today you received the following chemotherapy agents VP 16 (Etoposide) To help prevent nausea and vomiting after your treatment, we encourage you to take your nausea medication as prescribed.   If you develop nausea and vomiting that is not controlled by your nausea medication, call the clinic.   BELOW ARE SYMPTOMS THAT SHOULD BE REPORTED IMMEDIATELY:  *FEVER GREATER THAN 100.5 F  *CHILLS WITH OR WITHOUT FEVER  NAUSEA AND VOMITING THAT IS NOT CONTROLLED WITH YOUR NAUSEA MEDICATION  *UNUSUAL SHORTNESS OF BREATH  *UNUSUAL BRUISING OR BLEEDING  TENDERNESS IN MOUTH AND THROAT WITH OR WITHOUT PRESENCE OF ULCERS  *URINARY PROBLEMS  *BOWEL PROBLEMS  UNUSUAL RASH Items with * indicate a potential emergency and should be followed up as soon as possible.  Feel free to call the clinic you have any questions or concerns. The clinic phone number is [phone].    

## 2013-07-27 ENCOUNTER — Ambulatory Visit: Payer: PRIVATE HEALTH INSURANCE

## 2013-07-27 ENCOUNTER — Ambulatory Visit (HOSPITAL_BASED_OUTPATIENT_CLINIC_OR_DEPARTMENT_OTHER): Payer: PRIVATE HEALTH INSURANCE

## 2013-07-27 VITALS — BP 135/60 | HR 50 | Temp 97.2°F

## 2013-07-27 DIAGNOSIS — Z5111 Encounter for antineoplastic chemotherapy: Secondary | ICD-10-CM

## 2013-07-27 DIAGNOSIS — C8589 Other specified types of non-Hodgkin lymphoma, extranodal and solid organ sites: Secondary | ICD-10-CM

## 2013-07-27 DIAGNOSIS — IMO0002 Reserved for concepts with insufficient information to code with codable children: Secondary | ICD-10-CM

## 2013-07-27 MED ORDER — DEXAMETHASONE SODIUM PHOSPHATE 20 MG/5ML IJ SOLN
20.0000 mg | Freq: Once | INTRAMUSCULAR | Status: AC
  Administered 2013-07-27: 20 mg via INTRAVENOUS

## 2013-07-27 MED ORDER — SODIUM CHLORIDE 0.9 % IV SOLN
Freq: Once | INTRAVENOUS | Status: AC
  Administered 2013-07-27: 14:00:00 via INTRAVENOUS

## 2013-07-27 MED ORDER — SODIUM CHLORIDE 0.9 % IV SOLN
80.0000 mg/m2 | Freq: Once | INTRAVENOUS | Status: AC
  Administered 2013-07-27: 170 mg via INTRAVENOUS
  Filled 2013-07-27: qty 8.5

## 2013-07-27 MED ORDER — ONDANSETRON 16 MG/50ML IVPB (CHCC)
16.0000 mg | Freq: Once | INTRAVENOUS | Status: AC
  Administered 2013-07-27: 16 mg via INTRAVENOUS

## 2013-07-27 NOTE — Patient Instructions (Signed)
[facility] Discharge Instructions for Patients Receiving Chemotherapy  Today you received the following chemotherapy agents VP 16 (Etoposide) To help prevent nausea and vomiting after your treatment, we encourage you to take your nausea medication as prescribed.   If you develop nausea and vomiting that is not controlled by your nausea medication, call the clinic.   BELOW ARE SYMPTOMS THAT SHOULD BE REPORTED IMMEDIATELY:  *FEVER GREATER THAN 100.5 F  *CHILLS WITH OR WITHOUT FEVER  NAUSEA AND VOMITING THAT IS NOT CONTROLLED WITH YOUR NAUSEA MEDICATION  *UNUSUAL SHORTNESS OF BREATH  *UNUSUAL BRUISING OR BLEEDING  TENDERNESS IN MOUTH AND THROAT WITH OR WITHOUT PRESENCE OF ULCERS  *URINARY PROBLEMS  *BOWEL PROBLEMS  UNUSUAL RASH Items with * indicate a potential emergency and should be followed up as soon as possible.  Feel free to call the clinic you have any questions or concerns. The clinic phone number is [phone].    

## 2013-07-28 ENCOUNTER — Encounter: Payer: Self-pay | Admitting: Pharmacist

## 2013-07-28 ENCOUNTER — Other Ambulatory Visit: Payer: Self-pay | Admitting: Medical Oncology

## 2013-07-28 ENCOUNTER — Encounter (HOSPITAL_BASED_OUTPATIENT_CLINIC_OR_DEPARTMENT_OTHER): Payer: PRIVATE HEALTH INSURANCE

## 2013-07-28 VITALS — BP 127/73 | HR 50 | Temp 98.7°F | Resp 16

## 2013-07-28 DIAGNOSIS — C8589 Other specified types of non-Hodgkin lymphoma, extranodal and solid organ sites: Secondary | ICD-10-CM

## 2013-07-28 DIAGNOSIS — Z5189 Encounter for other specified aftercare: Secondary | ICD-10-CM

## 2013-07-28 DIAGNOSIS — IMO0002 Reserved for concepts with insufficient information to code with codable children: Secondary | ICD-10-CM

## 2013-07-28 MED ORDER — PEGFILGRASTIM INJECTION 6 MG/0.6ML
6.0000 mg | Freq: Once | SUBCUTANEOUS | Status: AC
  Administered 2013-07-28: 6 mg via SUBCUTANEOUS

## 2013-07-28 MED ORDER — PEGFILGRASTIM INJECTION 6 MG/0.6ML
SUBCUTANEOUS | Status: AC
  Filled 2013-07-28: qty 0.6

## 2013-07-28 NOTE — Progress Notes (Signed)
Megan Mcknight presents today for injection per the provider's orders.  Neulasta administered administration without incident; see MAR for injection details.  Patient tolerated procedure well and without incident.  No questions or complaints noted at this time.

## 2013-07-29 ENCOUNTER — Telehealth: Payer: Self-pay | Admitting: Hematology and Oncology

## 2013-07-29 NOTE — Telephone Encounter (Signed)
lvm fo pt regarding to [facility]. lab adn inj.Marland KitchenMarland Kitchen

## 2013-08-05 ENCOUNTER — Encounter (HOSPITAL_BASED_OUTPATIENT_CLINIC_OR_DEPARTMENT_OTHER): Payer: PRIVATE HEALTH INSURANCE

## 2013-08-05 DIAGNOSIS — IMO0002 Reserved for concepts with insufficient information to code with codable children: Secondary | ICD-10-CM

## 2013-08-05 LAB — DIFFERENTIAL
Basophils Absolute: 0.1 10*3/uL (ref 0.0–0.1)
Basophils Relative: 1 % (ref 0–1)
Eosinophils Absolute: 0 10*3/uL (ref 0.0–0.7)
Eosinophils Relative: 0 % (ref 0–5)
Lymphocytes Relative: 22 % (ref 12–46)
Lymphs Abs: 2.9 10*3/uL (ref 0.7–4.0)
Monocytes Absolute: 0.8 10*3/uL (ref 0.1–1.0)
Monocytes Relative: 6 % (ref 3–12)
Neutro Abs: 9.6 10*3/uL — ABNORMAL HIGH (ref 1.7–7.7)
Neutrophils Relative %: 71 % (ref 43–77)

## 2013-08-05 LAB — CBC
HCT: 29.2 % — ABNORMAL LOW (ref 36.0–46.0)
Hemoglobin: 9.5 g/dL — ABNORMAL LOW (ref 12.0–15.0)
MCH: 27.6 pg (ref 26.0–34.0)
MCHC: 32.5 g/dL (ref 30.0–36.0)
MCV: 84.9 fL (ref 78.0–100.0)
Platelets: 83 10*3/uL — ABNORMAL LOW (ref 150–400)
RBC: 3.44 MIL/uL — ABNORMAL LOW (ref 3.87–5.11)
RDW: 20.1 % — ABNORMAL HIGH (ref 11.5–15.5)
WBC: 13.4 10*3/uL — ABNORMAL HIGH (ref 4.0–10.5)

## 2013-08-05 NOTE — Progress Notes (Signed)
Labs drawn today for cbc/diff 

## 2013-08-09 LAB — PATHOLOGIST SMEAR REVIEW

## 2013-08-10 ENCOUNTER — Encounter (HOSPITAL_COMMUNITY): Admission: RE | Admit: 2013-08-10 | Payer: PRIVATE HEALTH INSURANCE | Source: Ambulatory Visit

## 2013-08-12 ENCOUNTER — Encounter (HOSPITAL_BASED_OUTPATIENT_CLINIC_OR_DEPARTMENT_OTHER): Payer: PRIVATE HEALTH INSURANCE

## 2013-08-12 DIAGNOSIS — IMO0002 Reserved for concepts with insufficient information to code with codable children: Secondary | ICD-10-CM

## 2013-08-12 DIAGNOSIS — C8589 Other specified types of non-Hodgkin lymphoma, extranodal and solid organ sites: Secondary | ICD-10-CM

## 2013-08-12 LAB — CBC WITH DIFFERENTIAL/PLATELET
Basophils Absolute: 0 10*3/uL (ref 0.0–0.1)
Basophils Relative: 1 % (ref 0–1)
Eosinophils Absolute: 0 10*3/uL (ref 0.0–0.7)
Eosinophils Relative: 1 % (ref 0–5)
HCT: 33.2 % — ABNORMAL LOW (ref 36.0–46.0)
Hemoglobin: 10.5 g/dL — ABNORMAL LOW (ref 12.0–15.0)
Lymphocytes Relative: 43 % (ref 12–46)
Lymphs Abs: 1.5 10*3/uL (ref 0.7–4.0)
MCH: 27.1 pg (ref 26.0–34.0)
MCHC: 31.6 g/dL (ref 30.0–36.0)
MCV: 85.8 fL (ref 78.0–100.0)
Monocytes Absolute: 0.3 10*3/uL (ref 0.1–1.0)
Monocytes Relative: 9 % (ref 3–12)
Neutro Abs: 1.6 10*3/uL — ABNORMAL LOW (ref 1.7–7.7)
Neutrophils Relative %: 46 % (ref 43–77)
Platelets: 205 10*3/uL (ref 150–400)
RBC: 3.87 MIL/uL (ref 3.87–5.11)
RDW: 19.3 % — ABNORMAL HIGH (ref 11.5–15.5)
WBC: 3.5 10*3/uL — ABNORMAL LOW (ref 4.0–10.5)

## 2013-08-12 NOTE — Progress Notes (Signed)
Labs drawn today for cbc/diff 

## 2013-08-16 ENCOUNTER — Telehealth: Payer: Self-pay | Admitting: Internal Medicine

## 2013-08-16 ENCOUNTER — Telehealth: Payer: Self-pay | Admitting: *Deleted

## 2013-08-16 ENCOUNTER — Ambulatory Visit (HOSPITAL_BASED_OUTPATIENT_CLINIC_OR_DEPARTMENT_OTHER): Payer: PRIVATE HEALTH INSURANCE

## 2013-08-16 ENCOUNTER — Ambulatory Visit (HOSPITAL_BASED_OUTPATIENT_CLINIC_OR_DEPARTMENT_OTHER): Payer: PRIVATE HEALTH INSURANCE | Admitting: Physician Assistant

## 2013-08-16 ENCOUNTER — Encounter: Payer: Self-pay | Admitting: Physician Assistant

## 2013-08-16 ENCOUNTER — Other Ambulatory Visit (HOSPITAL_BASED_OUTPATIENT_CLINIC_OR_DEPARTMENT_OTHER): Payer: PRIVATE HEALTH INSURANCE | Admitting: Lab

## 2013-08-16 VITALS — BP 120/79 | HR 78 | Temp 98.0°F | Resp 18 | Ht 68.0 in | Wt 216.4 lb

## 2013-08-16 DIAGNOSIS — C8589 Other specified types of non-Hodgkin lymphoma, extranodal and solid organ sites: Secondary | ICD-10-CM

## 2013-08-16 DIAGNOSIS — R0609 Other forms of dyspnea: Secondary | ICD-10-CM

## 2013-08-16 DIAGNOSIS — IMO0002 Reserved for concepts with insufficient information to code with codable children: Secondary | ICD-10-CM

## 2013-08-16 DIAGNOSIS — I4891 Unspecified atrial fibrillation: Secondary | ICD-10-CM

## 2013-08-16 DIAGNOSIS — R0989 Other specified symptoms and signs involving the circulatory and respiratory systems: Secondary | ICD-10-CM

## 2013-08-16 DIAGNOSIS — Z5111 Encounter for antineoplastic chemotherapy: Secondary | ICD-10-CM

## 2013-08-16 LAB — CBC WITH DIFFERENTIAL/PLATELET
BASO%: 0.9 % (ref 0.0–2.0)
Basophils Absolute: 0 10*3/uL (ref 0.0–0.1)
EOS%: 0.4 % (ref 0.0–7.0)
Eosinophils Absolute: 0 10*3/uL (ref 0.0–0.5)
HCT: 32.1 % — ABNORMAL LOW (ref 34.8–46.6)
HGB: 10.5 g/dL — ABNORMAL LOW (ref 11.6–15.9)
LYMPH%: 32.5 % (ref 14.0–49.7)
MCH: 27.3 pg (ref 25.1–34.0)
MCHC: 32.7 g/dL (ref 31.5–36.0)
MCV: 83.6 fL (ref 79.5–101.0)
MONO#: 0.5 10*3/uL (ref 0.1–0.9)
MONO%: 10.2 % (ref 0.0–14.0)
NEUT#: 2.5 10*3/uL (ref 1.5–6.5)
NEUT%: 56 % (ref 38.4–76.8)
Platelets: 259 10*3/uL (ref 145–400)
RBC: 3.84 10*6/uL (ref 3.70–5.45)
RDW: 19.1 % — ABNORMAL HIGH (ref 11.2–14.5)
WBC: 4.5 10*3/uL (ref 3.9–10.3)
lymph#: 1.5 10*3/uL (ref 0.9–3.3)
nRBC: 1 % — ABNORMAL HIGH (ref 0–0)

## 2013-08-16 MED ORDER — SODIUM CHLORIDE 0.9 % IV SOLN
600.0000 mg/m2 | Freq: Once | INTRAVENOUS | Status: AC
  Administered 2013-08-16: 1300 mg via INTRAVENOUS
  Filled 2013-08-16: qty 65

## 2013-08-16 MED ORDER — VINCRISTINE SULFATE CHEMO INJECTION 1 MG/ML
2.0000 mg | Freq: Once | INTRAVENOUS | Status: AC
  Administered 2013-08-16: 2 mg via INTRAVENOUS
  Filled 2013-08-16: qty 2

## 2013-08-16 MED ORDER — DEXAMETHASONE SODIUM PHOSPHATE 20 MG/5ML IJ SOLN: 20.0000 mg | Freq: Once | INTRAMUSCULAR | Status: DC

## 2013-08-16 MED ORDER — HEPARIN SOD (PORK) LOCK FLUSH 100 UNIT/ML IV SOLN
500.0000 [IU] | Freq: Once | INTRAVENOUS | Status: AC | PRN
  Administered 2013-08-16: 500 [IU]
  Filled 2013-08-16: qty 5

## 2013-08-16 MED ORDER — DEXAMETHASONE SODIUM PHOSPHATE 20 MG/5ML IJ SOLN
20.0000 mg | Freq: Once | INTRAMUSCULAR | Status: AC
  Administered 2013-08-16: 20 mg via INTRAVENOUS

## 2013-08-16 MED ORDER — DOXORUBICIN HCL CHEMO IV INJECTION 2 MG/ML
40.0000 mg/m2 | Freq: Once | INTRAVENOUS | Status: AC
  Administered 2013-08-16: 86 mg via INTRAVENOUS
  Filled 2013-08-16: qty 43

## 2013-08-16 MED ORDER — SODIUM CHLORIDE 0.9 % IV SOLN
Freq: Once | INTRAVENOUS | Status: AC
  Administered 2013-08-16: 11:00:00 via INTRAVENOUS

## 2013-08-16 MED ORDER — SODIUM CHLORIDE 0.9 % IV SOLN
80.0000 mg/m2 | Freq: Once | INTRAVENOUS | Status: AC
  Administered 2013-08-16: 170 mg via INTRAVENOUS
  Filled 2013-08-16: qty 8.5

## 2013-08-16 MED ORDER — ONDANSETRON 16 MG/50ML IVPB (CHCC)
16.0000 mg | Freq: Once | INTRAVENOUS | Status: AC
  Administered 2013-08-16: 16 mg via INTRAVENOUS

## 2013-08-16 MED ORDER — SODIUM CHLORIDE 0.9 % IJ SOLN
10.0000 mL | INTRAMUSCULAR | Status: DC | PRN
  Administered 2013-08-16: 10 mL
  Filled 2013-08-16: qty 10

## 2013-08-16 MED ORDER — ONDANSETRON 16 MG/50ML IVPB (CHCC): 16.0000 mg | Freq: Once | INTRAVENOUS | Status: DC

## 2013-08-16 NOTE — Telephone Encounter (Signed)
Gave pt appt for labs, MD and chemo for September 2014

## 2013-08-16 NOTE — Patient Instructions (Signed)
[facility] Discharge Instructions for Patients Receiving Chemotherapy  Today you received the following chemotherapy agents Adriamycin, Vincristine, Cytoxan,VP=16  To help prevent nausea and vomiting after your treatment, we encourage you to take your nausea medication as prescribed.  If you develop nausea and vomiting that is not controlled by your nausea medication, call the clinic.   BELOW ARE SYMPTOMS THAT SHOULD BE REPORTED IMMEDIATELY:  *FEVER GREATER THAN 100.5 F  *CHILLS WITH OR WITHOUT FEVER  NAUSEA AND VOMITING THAT IS NOT CONTROLLED WITH YOUR NAUSEA MEDICATION  *UNUSUAL SHORTNESS OF BREATH  *UNUSUAL BRUISING OR BLEEDING  TENDERNESS IN MOUTH AND THROAT WITH OR WITHOUT PRESENCE OF ULCERS  *URINARY PROBLEMS  *BOWEL PROBLEMS  UNUSUAL RASH Items with * indicate a potential emergency and should be followed up as soon as possible.  Feel free to call the clinic you have any questions or concerns. The clinic phone number is [phone].

## 2013-08-16 NOTE — Telephone Encounter (Signed)
Per staff message I have adjusted 9/22 appt

## 2013-08-16 NOTE — Progress Notes (Signed)
CC:   Salvatore Decent. Dorris Fetch, M.D. [name] D. Felecia Shelling, MD  PROBLEM LIST: 1. 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 Dorris Fetch, M.D. There is also involvement of the lungs with     multiple lung nodules, liver and bilateral inguinal lymph nodes,     the latter seen on PET scan from 05/30/2013.  Bone marrow on     05/18/2013 was negative.  IPI score was 2.  Histologic diagnosis     was made on 05/13/2013 by mediastinal lymph node biopsy.     Chemotherapy with Cytoxan, Adriamycin, vincristine, etoposide and     prednisone in conjunction with Neulasta was initiated on     05/20/2013. 2. Brief episode of atrial fibrillation with rapid ventricular     response in late May 2014 at time of lymphoma presentation. 3. Dyslipidemia. 4. Seasonal allergies. 5. Diverticulosis. 6. Status post hysterectomy. 7. Right-sided Port-A-Cath placed on 05/23/2013.  MEDICATIONS:  Reviewed and recorded. Current Outpatient Prescriptions  Medication Sig Dispense Refill  . albuterol (PROVENTIL HFA;VENTOLIN HFA) 108 (90 BASE) MCG/ACT inhaler Inhale 2 puffs into the lungs every 6 (six) hours as needed for wheezing.      Marland Kitchen allopurinol (ZYLOPRIM) 300 MG tablet Take 1 tablet (300 mg total) by mouth daily.  30 tablet  1  . amLODipine (NORVASC) 5 MG tablet Take 1 tablet (5 mg total) by mouth daily.  30 tablet  0  . guaiFENesin-dextromethorphan (ROBITUSSIN DM) 100-10 MG/5ML syrup Take 5 mLs by mouth every 4 (four) hours as needed for cough.  118 mL  0  . ibuprofen (ADVIL,MOTRIN) 200 MG tablet Take 400 mg by mouth every 6 (six) hours as needed for pain.      Marland Kitchen lidocaine-prilocaine (EMLA) cream Apply to port site one hour before treatment and cover with plastic wrap  1 each  3  . metoprolol tartrate (LOPRESSOR) 25 MG tablet Take 1 tablet (25 mg total) by  mouth 3 (three) times daily.  90 tablet  0  . montelukast (SINGULAIR) 10 MG tablet Take 10 mg by mouth daily as needed (Allergies).       . nystatin (MYCOSTATIN) powder Apply to affected skin folds two to three times per day.  15 g  1  . ondansetron (ZOFRAN) 8 MG tablet Take 1 tablet (8 mg total) by mouth every 12 (twelve) hours as needed for nausea.  30 tablet  2  . oxyCODONE (OXY IR/ROXICODONE) 5 MG immediate release tablet Take 1 tablet (5 mg total) by mouth every 4 (four) hours as needed.  60 tablet  0  . pantoprazole (PROTONIX) 40 MG tablet Take 1 tablet (40 mg total) by mouth daily.  30 tablet  1  . predniSONE (DELTASONE) 20 MG tablet Take 5 tablets (100 mg) daily for 5 days with each round of chemotherapy  125 tablet  1  . prochlorperazine (COMPAZINE) 10 MG tablet Take 1 tablet (10 mg total) by mouth every 6 (six) hours as needed (nausea).  30 tablet  2  . rosuvastatin (CRESTOR) 10 MG tablet Take 10 mg by mouth daily.      Marland Kitchen senna (SENOKOT) 8.6 MG TABS Take 1 tablet (8.6 mg total) by mouth at bedtime.  120 each  0   No current facility-administered medications for this visit.  Facility-Administered Medications Ordered in Other Visits  Medication Dose Route Frequency Provider Last Rate Last Dose  . sodium chloride 0.9 % injection 10 mL  10 mL Intracatheter PRN Myra Rude, MD   10 mL at 08/16/13 1428     SMOKING HISTORY:  The patient smoked half a pack cigarettes a day for over 20 years.  She stopped smoking in early May 2014.    HISTORY:  Megan Mcknight is a 52 year old married African American female  who had been in fairly good health up until a few months ago when she presented with a respiratory illness characterized by fever, cough and shortness of breath.  The patient was found to have extensive pulmonary nodules on a chest CT scan from 05/10/2013.  She underwent video bronchoscopy with endobronchial ultrasound and mediastinoscopy on 05/13/2013 by Salvatore Decent. Dorris Fetch, M.D.  The mediastinal biopsies showed anaplastic large-cell lymphoma, CD 30 and ALK positive. CD 20 negative.  Tumor was high-grade stage IV with an IPI score of 2.  At that point, the patient was running low-grade fever, was on oxygen.  She underwent a bone marrow aspirate and biopsy on 05/18/2013 that was negative for lymphoma.  Chemotherapy with CHOEP regimen was started on 05/20/2013.  Dr. Vedia Pereyra reduced the doses slightly to avoid profound bone marrow suppression.  The patient received Neulasta while she was hospitalized following her treatments and then subsequently received Neupogen daily while hospitalized and then on 05/25/2013, after she was discharged from the hospital, she received Neulasta 6 mg subcu.  The patient had a Port-A-Cath placed via interventional radiology on the right side on 05/23/2013.  She was not able to get a PET scan while hospitalized but did have a PET scan on 05/30/2013.  The scan shows only uptake in bilateral inguinal lymph nodes that are not really significantly enlarged.  The patient has had a striking regression of tumor in her lungs, at least 90%.  There has also been a decrease in the liver masses.  None of those areas showed increased uptake, interestingly enough. She is S/p 4 cycles now.  Globally she is tolerating her treatment well. She does admit to cutting the grass are riding lawn mower shortly before her last cycle of chemotherapy. She states that these activity or her out she had some generalized soreness. She has "learned my less than" and will be taking it easy. She is wondering when she may be able to return to work as her employer is wanting him at least the pulmonary timeframe. 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 She currently gets her weekly labs as well as her [facility] as this is more convenient for her. She does note some numbness at the tips of her fingers but has no loss of fine  motor dexterity. She reports hot flashes but states that these were present for several years prior to her diagnosis. She denied any fever, chills, nausea, vomiting, constipation or diarrhea. She presents to proceed with cycle #5 of her systemic chemotherapy with CHOP. She has lost her hair. She only has some minimal dyspnea on exertion and is not using any oxygen.  She denies any pain.  She denied cough. She denied a mucositis or night sweats. She does mention some vulvar irritation over the past couple of days. She denies any discharge or discrete rash. She currently uses dial soap.    Blood pressure 120/79, pulse 78, temperature 98 F (36.7 C), temperature source Oral, resp. rate  18, height 5\' 8"  (1.727 m), weight 216 lb 6.4 oz (98.158 kg). PHYSICAL EXAMINATION:  The patient looks well.  She is 52 years old. She is in no acute distress.  There is no scleral icterus.  Mouth and pharynx - no evidence of thrush or mucositis. She has some missing teeth, particular the molars.  Scalp alopecia from the chemotherapy. There is no peripheral adenopathy palpable in the neck, axillary or inguinal areas.  Heart and lungs are normal.  Breasts are not examined. There is a right-sided Port-A-Cath. Port-A-Cath nontender, no erythema non fluctuant, incision well-healed   Abdomen is somewhat obese, nontender with no organomegaly or masses palpable.  The patient has undergone a hysterectomy. Extremities:  No peripheral edema or clubbing.  No dorsiflexor weakness. Neurologic exam is normal.  LABORATORY DATA:   CBC    Component Value Date/Time   WBC 4.5 08/16/2013 0920   WBC 3.5* 08/12/2013 0940   RBC 3.84 08/16/2013 0920   RBC 3.87 08/12/2013 0940   HGB 10.5* 08/16/2013 0920   HGB 10.5* 08/12/2013 0940   HCT 32.1* 08/16/2013 0920   HCT 33.2* 08/12/2013 0940   PLT 259 08/16/2013 0920   PLT 205 08/12/2013 0940   MCV 83.6 08/16/2013 0920   MCV 85.8 08/12/2013 0940   MCH 27.3 08/16/2013 0920   MCH 27.1 08/12/2013 0940    MCHC 32.7 08/16/2013 0920   MCHC 31.6 08/12/2013 0940   RDW 19.1* 08/16/2013 0920   RDW 19.3* 08/12/2013 0940   LYMPHSABS 1.5 08/16/2013 0920   LYMPHSABS 1.5 08/12/2013 0940   MONOABS 0.5 08/16/2013 0920   MONOABS 0.3 08/12/2013 0940   EOSABS 0.0 08/16/2013 0920   EOSABS 0.0 08/12/2013 0940   BASOSABS 0.0 08/16/2013 0920   BASOSABS 0.0 08/12/2013 0940    Lab Results  Component Value Date   GLUCOSE 269* 07/25/2013   BUN 13.6 07/25/2013   CO2 22 07/25/2013   ALT 29 07/25/2013   AST 20 07/25/2013   LDH 221 07/25/2013   K 4.0 07/25/2013   CREATININE 0.9 07/25/2013    IMAGING STUDIES: 1. Chest x-ray, 2 view, from 03/09/2013 showed no active disease. 2. 2-D echocardiogram on 05/06/2013 showed  moderate LVH with     disproportionate septal thickening.  Systolic function was     hyperdynamic.  The estimated ejection fraction was 75%.  Wall     motion was normal.  There were no regional wall motion     abnormalities. 3. Chest x-ray, 1 view, on 05/09/2013 showed diffuse micronodular     pattern with worsening density at the lung bases. 4. CT scan of the chest without IV contrast on 05/10/2013 showed     diffuse pulmonary nodules and enlarged bilateral mediastinal lymph     nodes, all concerning for metastatic disease.  There were multi     focal low attenuation lesions within the liver also concerning for     metastatic disease. 5. PET scan on 05/30/2013 showed small hypermetabolic bilateral     inguinal lymph nodes with maximum SUV of 9.8 for the right inguinal     lymph node and maximum SUV of 6.8 for the left inguinal lymph node.     There was intense hypermetabolic activity throughout the axial and     appendicular skeleton felt to be due to either marrow involvement     by lymphoma or pharmacologic marrow hyperstimulation.  There were     numerous diffuse tiny pulmonary nodules that showed no  hypermetabolic activity but may have been below the PET size     threshold for detection of  malignancy.   IMPRESSION AND PLAN:  Mrs. Hassebrock has had an excellent clinical response to her first 4 cycles of chemotherapy. The patient was discussed with Dr. Rosie Fate. She will continue to get her weekly labs and her Neul[facility] injections at [facility] is more [facility] for her. She will proceed with cycle #5 of her systemic chemotherapy with CHOP. She will return in 3 weeks prior to cycle #6 for a symptom management visit with repeat CBC differential C. met and LDH. We will plan to order a for restaging PET scan after she completes cycle #6 to reevaluate her disease. Patient advised to discontinue the use of Dial soap in her vulvar area and switch to something milder such as Dove soap. We'll continue to monitor her symptoms.   Laural Benes, Orton Capell E, PA-C

## 2013-08-16 NOTE — Patient Instructions (Addendum)
Continue weekly labs as scheduled Follow up in 3 weeks, prior to your next cycle of chemotherapy

## 2013-08-16 NOTE — Telephone Encounter (Signed)
Talked to pt , she is aware of all appts dor September , lab md and chemo

## 2013-08-17 ENCOUNTER — Ambulatory Visit (HOSPITAL_BASED_OUTPATIENT_CLINIC_OR_DEPARTMENT_OTHER): Payer: PRIVATE HEALTH INSURANCE

## 2013-08-17 VITALS — BP 133/69 | HR 86 | Temp 97.7°F

## 2013-08-17 DIAGNOSIS — C8589 Other specified types of non-Hodgkin lymphoma, extranodal and solid organ sites: Secondary | ICD-10-CM

## 2013-08-17 DIAGNOSIS — Z5111 Encounter for antineoplastic chemotherapy: Secondary | ICD-10-CM

## 2013-08-17 MED ORDER — SODIUM CHLORIDE 0.9 % IV SOLN
Freq: Once | INTRAVENOUS | Status: AC
  Administered 2013-08-17: 10:00:00 via INTRAVENOUS

## 2013-08-17 MED ORDER — SODIUM CHLORIDE 0.9 % IV SOLN
80.0000 mg/m2 | Freq: Once | INTRAVENOUS | Status: AC
  Administered 2013-08-17: 170 mg via INTRAVENOUS
  Filled 2013-08-17: qty 8.5

## 2013-08-17 MED ORDER — ONDANSETRON 16 MG/50ML IVPB (CHCC)
16.0000 mg | Freq: Once | INTRAVENOUS | Status: AC
  Administered 2013-08-17: 16 mg via INTRAVENOUS

## 2013-08-17 MED ORDER — DEXAMETHASONE SODIUM PHOSPHATE 20 MG/5ML IJ SOLN
20.0000 mg | Freq: Once | INTRAMUSCULAR | Status: AC
  Administered 2013-08-17: 20 mg via INTRAVENOUS

## 2013-08-17 NOTE — Patient Instructions (Addendum)
[facility] Discharge Instructions for Patients Receiving Chemotherapy  Today you received the following chemotherapy agents VP-16  To help prevent nausea and vomiting after your treatment, we encourage you to take your nausea medication     If you develop nausea and vomiting that is not controlled by your nausea medication, call the clinic.   BELOW ARE SYMPTOMS THAT SHOULD BE REPORTED IMMEDIATELY:  *FEVER GREATER THAN 100.5 F  *CHILLS WITH OR WITHOUT FEVER  NAUSEA AND VOMITING THAT IS NOT CONTROLLED WITH YOUR NAUSEA MEDICATION  *UNUSUAL SHORTNESS OF BREATH  *UNUSUAL BRUISING OR BLEEDING  TENDERNESS IN MOUTH AND THROAT WITH OR WITHOUT PRESENCE OF ULCERS  *URINARY PROBLEMS  *BOWEL PROBLEMS  UNUSUAL RASH Items with * indicate a potential emergency and should be followed up as soon as possible.  Feel free to call the clinic you have any questions or concerns. The clinic phone number is [phone].    

## 2013-08-18 ENCOUNTER — Ambulatory Visit (HOSPITAL_BASED_OUTPATIENT_CLINIC_OR_DEPARTMENT_OTHER): Payer: PRIVATE HEALTH INSURANCE

## 2013-08-18 VITALS — BP 138/74 | HR 64 | Temp 98.4°F

## 2013-08-18 DIAGNOSIS — Z5111 Encounter for antineoplastic chemotherapy: Secondary | ICD-10-CM

## 2013-08-18 DIAGNOSIS — C8589 Other specified types of non-Hodgkin lymphoma, extranodal and solid organ sites: Secondary | ICD-10-CM

## 2013-08-18 DIAGNOSIS — IMO0002 Reserved for concepts with insufficient information to code with codable children: Secondary | ICD-10-CM

## 2013-08-18 MED ORDER — SODIUM CHLORIDE 0.9 % IV SOLN
80.0000 mg/m2 | Freq: Once | INTRAVENOUS | Status: AC
  Administered 2013-08-18: 170 mg via INTRAVENOUS
  Filled 2013-08-18: qty 8.5

## 2013-08-18 MED ORDER — DEXAMETHASONE SODIUM PHOSPHATE 20 MG/5ML IJ SOLN
20.0000 mg | Freq: Once | INTRAMUSCULAR | Status: AC
  Administered 2013-08-18: 20 mg via INTRAVENOUS

## 2013-08-18 MED ORDER — SODIUM CHLORIDE 0.9 % IV SOLN
Freq: Once | INTRAVENOUS | Status: AC
  Administered 2013-08-18: 10:00:00 via INTRAVENOUS

## 2013-08-18 MED ORDER — ONDANSETRON 16 MG/50ML IVPB (CHCC)
16.0000 mg | Freq: Once | INTRAVENOUS | Status: AC
  Administered 2013-08-18: 16 mg via INTRAVENOUS

## 2013-08-18 NOTE — Patient Instructions (Signed)
[facility] Discharge Instructions for Patients Receiving Chemotherapy  Today you received the following chemotherapy agents VP 16 (Etoposide) To help prevent nausea and vomiting after your treatment, we encourage you to take your nausea medication as prescribed.   If you develop nausea and vomiting that is not controlled by your nausea medication, call the clinic.   BELOW ARE SYMPTOMS THAT SHOULD BE REPORTED IMMEDIATELY:  *FEVER GREATER THAN 100.5 F  *CHILLS WITH OR WITHOUT FEVER  NAUSEA AND VOMITING THAT IS NOT CONTROLLED WITH YOUR NAUSEA MEDICATION  *UNUSUAL SHORTNESS OF BREATH  *UNUSUAL BRUISING OR BLEEDING  TENDERNESS IN MOUTH AND THROAT WITH OR WITHOUT PRESENCE OF ULCERS  *URINARY PROBLEMS  *BOWEL PROBLEMS  UNUSUAL RASH Items with * indicate a potential emergency and should be followed up as soon as possible.  Feel free to call the clinic you have any questions or concerns. The clinic phone number is [phone].    

## 2013-08-19 ENCOUNTER — Encounter (HOSPITAL_COMMUNITY): Payer: PRIVATE HEALTH INSURANCE | Attending: Oncology

## 2013-08-19 VITALS — BP 135/88 | HR 88 | Temp 97.8°F | Resp 22

## 2013-08-19 DIAGNOSIS — IMO0002 Reserved for concepts with insufficient information to code with codable children: Secondary | ICD-10-CM

## 2013-08-19 DIAGNOSIS — C8589 Other specified types of non-Hodgkin lymphoma, extranodal and solid organ sites: Secondary | ICD-10-CM | POA: Insufficient documentation

## 2013-08-19 DIAGNOSIS — Z5189 Encounter for other specified aftercare: Secondary | ICD-10-CM

## 2013-08-19 MED ORDER — PEGFILGRASTIM INJECTION 6 MG/0.6ML
6.0000 mg | Freq: Once | SUBCUTANEOUS | Status: AC
  Administered 2013-08-19: 6 mg via SUBCUTANEOUS

## 2013-08-19 NOTE — Progress Notes (Signed)
Megan Mcknight presents today for injection per MD orders. Neulasta 6mg administered SQ in right Abdomen. Administration without incident. Patient tolerated well.  

## 2013-08-21 ENCOUNTER — Emergency Department (HOSPITAL_COMMUNITY): Payer: PRIVATE HEALTH INSURANCE

## 2013-08-21 ENCOUNTER — Encounter (HOSPITAL_COMMUNITY): Payer: Self-pay

## 2013-08-21 ENCOUNTER — Other Ambulatory Visit: Payer: Self-pay

## 2013-08-21 ENCOUNTER — Inpatient Hospital Stay (HOSPITAL_COMMUNITY)
Admission: EM | Admit: 2013-08-21 | Discharge: 2013-08-29 | DRG: 637 | Disposition: A | Discharge status: Home / Self Care | Payer: PRIVATE HEALTH INSURANCE | Attending: Internal Medicine | Admitting: Internal Medicine

## 2013-08-21 DIAGNOSIS — R739 Hyperglycemia, unspecified: Secondary | ICD-10-CM

## 2013-08-21 DIAGNOSIS — N179 Acute kidney failure, unspecified: Secondary | ICD-10-CM

## 2013-08-21 DIAGNOSIS — Z79899 Other long term (current) drug therapy: Secondary | ICD-10-CM

## 2013-08-21 DIAGNOSIS — G9340 Encephalopathy, unspecified: Secondary | ICD-10-CM

## 2013-08-21 DIAGNOSIS — D72819 Decreased white blood cell count, unspecified: Secondary | ICD-10-CM | POA: Diagnosis present

## 2013-08-21 DIAGNOSIS — Z9221 Personal history of antineoplastic chemotherapy: Secondary | ICD-10-CM

## 2013-08-21 DIAGNOSIS — D61818 Other pancytopenia: Secondary | ICD-10-CM | POA: Diagnosis present

## 2013-08-21 DIAGNOSIS — E883 Tumor lysis syndrome: Secondary | ICD-10-CM

## 2013-08-21 DIAGNOSIS — C3490 Malignant neoplasm of unspecified part of unspecified bronchus or lung: Secondary | ICD-10-CM | POA: Diagnosis present

## 2013-08-21 DIAGNOSIS — E870 Hyperosmolality and hypernatremia: Secondary | ICD-10-CM | POA: Diagnosis present

## 2013-08-21 DIAGNOSIS — C649 Malignant neoplasm of unspecified kidney, except renal pelvis: Secondary | ICD-10-CM | POA: Diagnosis present

## 2013-08-21 DIAGNOSIS — E876 Hypokalemia: Secondary | ICD-10-CM | POA: Diagnosis present

## 2013-08-21 DIAGNOSIS — E872 Acidosis, unspecified: Secondary | ICD-10-CM | POA: Diagnosis present

## 2013-08-21 DIAGNOSIS — E1101 Type 2 diabetes mellitus with hyperosmolarity with coma: Principal | ICD-10-CM | POA: Diagnosis present

## 2013-08-21 DIAGNOSIS — E875 Hyperkalemia: Secondary | ICD-10-CM

## 2013-08-21 DIAGNOSIS — IMO0002 Reserved for concepts with insufficient information to code with codable children: Secondary | ICD-10-CM | POA: Diagnosis present

## 2013-08-21 DIAGNOSIS — K219 Gastro-esophageal reflux disease without esophagitis: Secondary | ICD-10-CM | POA: Diagnosis present

## 2013-08-21 DIAGNOSIS — C8589 Other specified types of non-Hodgkin lymphoma, extranodal and solid organ sites: Secondary | ICD-10-CM

## 2013-08-21 DIAGNOSIS — Z91013 Allergy to seafood: Secondary | ICD-10-CM

## 2013-08-21 DIAGNOSIS — I4891 Unspecified atrial fibrillation: Secondary | ICD-10-CM | POA: Diagnosis present

## 2013-08-21 DIAGNOSIS — E785 Hyperlipidemia, unspecified: Secondary | ICD-10-CM | POA: Diagnosis present

## 2013-08-21 DIAGNOSIS — R4182 Altered mental status, unspecified: Secondary | ICD-10-CM

## 2013-08-21 DIAGNOSIS — Z87891 Personal history of nicotine dependence: Secondary | ICD-10-CM

## 2013-08-21 DIAGNOSIS — N390 Urinary tract infection, site not specified: Secondary | ICD-10-CM | POA: Diagnosis present

## 2013-08-21 DIAGNOSIS — B370 Candidal stomatitis: Secondary | ICD-10-CM | POA: Diagnosis present

## 2013-08-21 DIAGNOSIS — C8590 Non-Hodgkin lymphoma, unspecified, unspecified site: Secondary | ICD-10-CM | POA: Diagnosis present

## 2013-08-21 DIAGNOSIS — R7309 Other abnormal glucose: Secondary | ICD-10-CM

## 2013-08-21 DIAGNOSIS — Z825 Family history of asthma and other chronic lower respiratory diseases: Secondary | ICD-10-CM

## 2013-08-21 HISTORY — DX: Malignant (primary) neoplasm, unspecified: C80.1

## 2013-08-21 HISTORY — DX: Reserved for concepts with insufficient information to code with codable children: IMO0002

## 2013-08-21 LAB — BLOOD GAS, ARTERIAL
Acid-base deficit: 12.2 mmol/L — ABNORMAL HIGH (ref 0.0–2.0)
Bicarbonate: 14 mEq/L — ABNORMAL LOW (ref 20.0–24.0)
Drawn by: 27407
O2 Content: 2 L/min
O2 Saturation: 98.1 %
Patient temperature: 37
TCO2: 13.3 mmol/L (ref 0–100)
pCO2 arterial: 35.4 mmHg (ref 35.0–45.0)
pH, Arterial: 7.222 — ABNORMAL LOW (ref 7.350–7.450)
pO2, Arterial: 136 mmHg — ABNORMAL HIGH (ref 80.0–100.0)

## 2013-08-21 LAB — BASIC METABOLIC PANEL
BUN: 78 mg/dL — ABNORMAL HIGH (ref 6–23)
BUN: 73 mg/dL — ABNORMAL HIGH (ref 6–23)
BUN: 69 mg/dL — ABNORMAL HIGH (ref 6–23)
BUN: 65 mg/dL — ABNORMAL HIGH (ref 6–23)
BUN: 68 mg/dL — ABNORMAL HIGH (ref 6–23)
BUN: 67 mg/dL — ABNORMAL HIGH (ref 6–23)
CO2: 17 mEq/L — ABNORMAL LOW (ref 19–32)
CO2: 15 mEq/L — ABNORMAL LOW (ref 19–32)
CO2: 19 mEq/L (ref 19–32)
CO2: 23 mEq/L (ref 19–32)
CO2: 25 mEq/L (ref 19–32)
CO2: 26 mEq/L (ref 19–32)
Calcium: 10.7 mg/dL — ABNORMAL HIGH (ref 8.4–10.5)
Calcium: 10.3 mg/dL (ref 8.4–10.5)
Calcium: 10.5 mg/dL (ref 8.4–10.5)
Calcium: 10.5 mg/dL (ref 8.4–10.5)
Calcium: 10.7 mg/dL — ABNORMAL HIGH (ref 8.4–10.5)
Calcium: 10.5 mg/dL (ref 8.4–10.5)
Chloride: 93 mEq/L — ABNORMAL LOW (ref 96–112)
Chloride: 111 mEq/L (ref 96–112)
Chloride: 118 mEq/L — ABNORMAL HIGH (ref 96–112)
Chloride: 121 mEq/L — ABNORMAL HIGH (ref 96–112)
Chloride: 126 mEq/L — ABNORMAL HIGH (ref 96–112)
Chloride: 123 mEq/L — ABNORMAL HIGH (ref 96–112)
Creatinine, Ser: 2.76 mg/dL — ABNORMAL HIGH (ref 0.50–1.10)
Creatinine, Ser: 2.02 mg/dL — ABNORMAL HIGH (ref 0.50–1.10)
Creatinine, Ser: 1.78 mg/dL — ABNORMAL HIGH (ref 0.50–1.10)
Creatinine, Ser: 1.73 mg/dL — ABNORMAL HIGH (ref 0.50–1.10)
Creatinine, Ser: 1.73 mg/dL — ABNORMAL HIGH (ref 0.50–1.10)
Creatinine, Ser: 1.76 mg/dL — ABNORMAL HIGH (ref 0.50–1.10)
GFR calc Af Amer: 22 mL/min — ABNORMAL LOW (ref >90)
GFR calc Af Amer: 32 mL/min — ABNORMAL LOW (ref >90)
GFR calc Af Amer: 37 mL/min — ABNORMAL LOW (ref >90)
GFR calc Af Amer: 38 mL/min — ABNORMAL LOW (ref >90)
GFR calc Af Amer: 38 mL/min — ABNORMAL LOW (ref >90)
GFR calc Af Amer: 38 mL/min — ABNORMAL LOW (ref >90)
GFR calc non Af Amer: 19 mL/min — ABNORMAL LOW (ref >90)
GFR calc non Af Amer: 27 mL/min — ABNORMAL LOW (ref >90)
GFR calc non Af Amer: 32 mL/min — ABNORMAL LOW (ref >90)
GFR calc non Af Amer: 33 mL/min — ABNORMAL LOW (ref >90)
GFR calc non Af Amer: 33 mL/min — ABNORMAL LOW (ref >90)
GFR calc non Af Amer: 32 mL/min — ABNORMAL LOW (ref >90)
Glucose, Bld: 1763 mg/dL (ref 70–99)
Glucose, Bld: 1230 mg/dL (ref 70–99)
Glucose, Bld: 940 mg/dL (ref 70–99)
Glucose, Bld: 703 mg/dL (ref 70–99)
Glucose, Bld: 518 mg/dL — ABNORMAL HIGH (ref 70–99)
Glucose, Bld: 449 mg/dL — ABNORMAL HIGH (ref 70–99)
Potassium: 6.3 mEq/L (ref 3.5–5.1)
Potassium: 5.4 mEq/L — ABNORMAL HIGH (ref 3.5–5.1)
Potassium: 4.4 mEq/L (ref 3.5–5.1)
Potassium: 3.8 mEq/L (ref 3.5–5.1)
Potassium: 3.9 mEq/L (ref 3.5–5.1)
Potassium: 3.9 mEq/L (ref 3.5–5.1)
Sodium: 138 mEq/L (ref 135–145)
Sodium: 151 mEq/L — ABNORMAL HIGH (ref 135–145)
Sodium: 159 mEq/L — ABNORMAL HIGH (ref 135–145)
Sodium: 159 mEq/L — ABNORMAL HIGH (ref 135–145)
Sodium: 165 mEq/L (ref 135–145)
Sodium: 161 mEq/L (ref 135–145)

## 2013-08-21 LAB — CBC WITH DIFFERENTIAL/PLATELET
Basophils Absolute: 0.1 10*3/uL (ref 0.0–0.1)
Basophils Relative: 1 % (ref 0–1)
Eosinophils Absolute: 0 10*3/uL (ref 0.0–0.7)
Eosinophils Relative: 0 % (ref 0–5)
HCT: 46.9 % — ABNORMAL HIGH (ref 36.0–46.0)
Hemoglobin: 12.5 g/dL (ref 12.0–15.0)
Lymphocytes Relative: 3 % — ABNORMAL LOW (ref 12–46)
Lymphs Abs: 0.1 10*3/uL — ABNORMAL LOW (ref 0.7–4.0)
MCH: 28.3 pg (ref 26.0–34.0)
MCHC: 26.7 g/dL — ABNORMAL LOW (ref 30.0–36.0)
MCV: 106.1 fL — ABNORMAL HIGH (ref 78.0–100.0)
Monocytes Absolute: 0.1 10*3/uL (ref 0.1–1.0)
Monocytes Relative: 1 % — ABNORMAL LOW (ref 3–12)
Neutro Abs: 4.6 10*3/uL (ref 1.7–7.7)
Neutrophils Relative %: 95 % — ABNORMAL HIGH (ref 43–77)
Platelets: ADEQUATE 10*3/uL (ref 150–400)
RBC: 4.42 MIL/uL (ref 3.87–5.11)
RDW: 18 % — ABNORMAL HIGH (ref 11.5–15.5)
Smear Review: ADEQUATE
WBC: 4.9 10*3/uL (ref 4.0–10.5)

## 2013-08-21 LAB — URINE MICROSCOPIC-ADD ON

## 2013-08-21 LAB — URINALYSIS, ROUTINE W REFLEX MICROSCOPIC
Bilirubin Urine: NEGATIVE
Glucose, UA: >1000 mg/dL — AB
Ketones, ur: 15 mg/dL — AB
Leukocytes, UA: NEGATIVE
Nitrite: NEGATIVE
Protein, ur: NEGATIVE mg/dL
Specific Gravity, Urine: <1.005 — ABNORMAL LOW (ref 1.005–1.030)
Urobilinogen, UA: 0.2 mg/dL (ref 0.0–1.0)
pH: 5.5 (ref 5.0–8.0)

## 2013-08-21 LAB — MAGNESIUM
Magnesium: 3.7 mg/dL — ABNORMAL HIGH (ref 1.5–2.5)
Magnesium: 3.7 mg/dL — ABNORMAL HIGH (ref 1.5–2.5)
Magnesium: 3.7 mg/dL — ABNORMAL HIGH (ref 1.5–2.5)

## 2013-08-21 LAB — GLUCOSE, CAPILLARY
Glucose-Capillary: >600 mg/dL (ref 70–99)
Glucose-Capillary: >600 mg/dL (ref 70–99)
Glucose-Capillary: >600 mg/dL (ref 70–99)
Glucose-Capillary: >600 mg/dL (ref 70–99)
Glucose-Capillary: >600 mg/dL (ref 70–99)
Glucose-Capillary: 589 mg/dL (ref 70–99)
Glucose-Capillary: >600 mg/dL (ref 70–99)
Glucose-Capillary: 501 mg/dL — ABNORMAL HIGH (ref 70–99)
Glucose-Capillary: 429 mg/dL — ABNORMAL HIGH (ref 70–99)
Glucose-Capillary: 384 mg/dL — ABNORMAL HIGH (ref 70–99)
Glucose-Capillary: 375 mg/dL — ABNORMAL HIGH (ref 70–99)

## 2013-08-21 LAB — URIC ACID
Uric Acid, Serum: 18 mg/dL — ABNORMAL HIGH (ref 2.4–7.0)
Uric Acid, Serum: 17.5 mg/dL — ABNORMAL HIGH (ref 2.4–7.0)
Uric Acid, Serum: 18.3 mg/dL — ABNORMAL HIGH (ref 2.4–7.0)

## 2013-08-21 LAB — LACTIC ACID, PLASMA: Lactic Acid, Venous: 1.8 mmol/L (ref 0.5–2.2)

## 2013-08-21 LAB — PHOSPHORUS
Phosphorus: 5.5 mg/dL — ABNORMAL HIGH (ref 2.3–4.6)
Phosphorus: 4 mg/dL (ref 2.3–4.6)
Phosphorus: 3.1 mg/dL (ref 2.3–4.6)

## 2013-08-21 LAB — CK: Total CK: 43 U/L (ref 7–177)

## 2013-08-21 LAB — MRSA PCR SCREENING: MRSA by PCR: NEGATIVE

## 2013-08-21 MED ORDER — CHLORHEXIDINE GLUCONATE 0.12 % MT SOLN
15.0000 mL | Freq: Two times a day (BID) | OROMUCOSAL | Status: DC
  Administered 2013-08-21 – 2013-08-24 (×7): 15 mL via OROMUCOSAL
  Filled 2013-08-21 (×7): qty 15

## 2013-08-21 MED ORDER — ONDANSETRON HCL 4 MG/2ML IJ SOLN
4.0000 mg | Freq: Four times a day (QID) | INTRAMUSCULAR | Status: DC | PRN
  Administered 2013-08-22: 4 mg via INTRAVENOUS
  Filled 2013-08-21: qty 2

## 2013-08-21 MED ORDER — SODIUM CHLORIDE 0.9 % IV SOLN
INTRAVENOUS | Status: DC
  Administered 2013-08-21: 26.5 [IU]/h via INTRAVENOUS
  Administered 2013-08-21: 25.2 [IU]/h via INTRAVENOUS
  Administered 2013-08-22: 3.9 [IU]/h via INTRAVENOUS
  Administered 2013-08-22: 12.5 [IU]/h via INTRAVENOUS
  Administered 2013-08-22: 03:00:00 via INTRAVENOUS
  Administered 2013-08-23: 4.1 [IU]/h via INTRAVENOUS
  Filled 2013-08-21 (×3): qty 1

## 2013-08-21 MED ORDER — DEXTROSE-NACL 5-0.45 % IV SOLN: INTRAVENOUS | Status: DC

## 2013-08-21 MED ORDER — INSULIN REGULAR HUMAN 100 UNIT/ML IJ SOLN
INTRAMUSCULAR | Status: AC
  Filled 2013-08-21: qty 3

## 2013-08-21 MED ORDER — SODIUM CHLORIDE 0.9 % IV SOLN
INTRAVENOUS | Status: DC
  Administered 2013-08-21: 5.4 [IU]/h via INTRAVENOUS
  Filled 2013-08-21: qty 1

## 2013-08-21 MED ORDER — SODIUM CHLORIDE 0.9 % IV SOLN
1000.0000 mL | Freq: Once | INTRAVENOUS | Status: AC
  Administered 2013-08-21: 1000 mL via INTRAVENOUS

## 2013-08-21 MED ORDER — SODIUM CHLORIDE 0.9 % IV BOLUS (SEPSIS)
1000.0000 mL | Freq: Once | INTRAVENOUS | Status: AC
  Administered 2013-08-21: 1000 mL via INTRAVENOUS

## 2013-08-21 MED ORDER — ALLOPURINOL 300 MG PO TABS: 300.0000 mg | ORAL_TABLET | Freq: Every day | ORAL | Status: DC

## 2013-08-21 MED ORDER — ONDANSETRON HCL 4 MG PO TABS: 4.0000 mg | ORAL_TABLET | Freq: Four times a day (QID) | ORAL | Status: DC | PRN

## 2013-08-21 MED ORDER — BIOTENE DRY MOUTH MT LIQD
15.0000 mL | Freq: Two times a day (BID) | OROMUCOSAL | Status: DC
  Administered 2013-08-21 – 2013-08-24 (×6): 15 mL via OROMUCOSAL

## 2013-08-21 MED ORDER — SODIUM CHLORIDE 0.45 % IV SOLN
INTRAVENOUS | Status: DC
  Administered 2013-08-21: 16:00:00 via INTRAVENOUS

## 2013-08-21 MED ORDER — SODIUM CHLORIDE 0.9 % IV SOLN
1.0000 g | Freq: Once | INTRAVENOUS | Status: AC
  Administered 2013-08-21: 1 g via INTRAVENOUS
  Filled 2013-08-21: qty 10

## 2013-08-21 MED ORDER — PANTOPRAZOLE SODIUM 40 MG IV SOLR
40.0000 mg | Freq: Every day | INTRAVENOUS | Status: DC
  Administered 2013-08-21 – 2013-08-23 (×3): 40 mg via INTRAVENOUS
  Filled 2013-08-21 (×3): qty 40

## 2013-08-21 MED ORDER — ACETAMINOPHEN 325 MG PO TABS
650.0000 mg | ORAL_TABLET | Freq: Four times a day (QID) | ORAL | Status: DC | PRN
  Administered 2013-08-22 – 2013-08-27 (×2): 650 mg via ORAL
  Filled 2013-08-21 (×2): qty 2

## 2013-08-21 MED ORDER — DEXTROSE 50 % IV SOLN: 25.0000 mL | INTRAVENOUS | Status: DC | PRN

## 2013-08-21 MED ORDER — ACETAMINOPHEN 650 MG RE SUPP: 650.0000 mg | Freq: Four times a day (QID) | RECTAL | Status: DC | PRN

## 2013-08-21 MED ORDER — DEXTROSE 5 % IV SOLN
INTRAVENOUS | Status: DC
  Administered 2013-08-21 – 2013-08-22 (×6): via INTRAVENOUS

## 2013-08-21 MED ORDER — SODIUM CHLORIDE 0.9 % IJ SOLN
3.0000 mL | Freq: Two times a day (BID) | INTRAMUSCULAR | Status: DC
  Administered 2013-08-22 – 2013-08-28 (×7): 3 mL via INTRAVENOUS

## 2013-08-21 MED ORDER — ALBUTEROL SULFATE (5 MG/ML) 0.5% IN NEBU
2.5000 mg | INHALATION_SOLUTION | Freq: Once | RESPIRATORY_TRACT | Status: DC
  Filled 2013-08-21: qty 0.5

## 2013-08-21 MED ORDER — SODIUM CHLORIDE 0.9 % IV SOLN
Freq: Once | INTRAVENOUS | Status: AC
  Administered 2013-08-21: 13:00:00 via INTRAVENOUS

## 2013-08-21 MED ORDER — SODIUM CHLORIDE 0.9 % IV SOLN: INTRAVENOUS | Status: DC

## 2013-08-21 NOTE — ED Notes (Signed)
Report given to Marcelino Duster, RN ICU. Ready to receive patient.

## 2013-08-21 NOTE — ED Notes (Signed)
EMS reports cbg read HI

## 2013-08-21 NOTE — ED Notes (Signed)
CRITICAL VALUE ALERT  Critical value received:  Glucose 1763 and K ++ 6.3  Date of notification: 08/21/2013  Time of notification:  1158  Critical value read back:glucose 1763 K++ 6.3  Nurse who received alert: JLR  MD notified (1st page): Adriana Simas  Time of first page:1158  Time MD responded:  1158

## 2013-08-21 NOTE — ED Notes (Signed)
Power port accessed in right chest using sterile technique. Patient tolerated procedure well. Blood obtained.

## 2013-08-21 NOTE — ED Notes (Signed)
Family states they do not need anything at this time. 

## 2013-08-21 NOTE — ED Notes (Signed)
EMS reports pt had double chemo treatment last Wednesday.  Reported pt's spouse said for the past 3 days pt has been very weak and going in and out of consciousness.  When ems arrived she was sitting on commode assisted by family member.  EMS reports enroute pt had eyes closed.  After 02 administration pt sat up in bed and was moving arms around.   Pt arrived in ED, eyes open looking around but nonverbal and drifts off to sleep easily.   Pt did not answer when asked her name but looked in the direction of the person talking to her.

## 2013-08-21 NOTE — ED Provider Notes (Signed)
CSN: [id number]     Arrival date & time 08/21/13  1032 History   This chart was scribed for Megan Hutching, MD, by Yevette Edwards, ED Scribe. This patient was seen in room APA14/APA14 and the patient's care was started at 11:00 AM. First MD Initiated Contact with Patient 08/21/13 1050     Chief Complaint  Patient presents with  . Altered Mental Status   LEVEL FIVE CAVEAT (AMS)  (Consider location/radiation/quality/duration/timing/severity/associated sxs/prior Treatment) The history is provided by medical records and a relative. The history is limited by the condition of the patient.   HPI Comments: KINBERLY Mcknight is a 52 y.o. female, with a h/o lung and kidney cancer, brought to the ED by EMS presenting with AMS. She had a double dose of chemotherapy four days ago, and her husband states that she has been increasingly weak and intermittently losing consciousness for the past three days. When the EMS arrived, the pt was on the commode with assistance of a family member.  Also, per EMS, the pt's glucose levels were above 600 cbg. When questioned at bedside, the pt did not verbally respond. She has her eyes open, she will intermittently look in the direction of the person who is addressing her, and she intermittently moves her arms around.   Past Medical History  Diagnosis Date  . Hyperlipidemia   . Nerve pain     Left leg  . Seasonal allergies   . Acute bronchitis   . Colon polyps 02/2013    Per colonoscopy  . Diverticulosis 02/2013    Per colonoscopy  . Former light tobacco smoker   . Dysrhythmia   . Cancer     lung and kidney   Past Surgical History  Procedure Laterality Date  . Partial hysterectomy    . Bones removed from baby toes    . Colonoscopy  02/16/2012    Procedure: COLONOSCOPY;  Surgeon: Corbin Ade, MD;  Location: [facility];  Service: Endoscopy;  Laterality: N/A;  1:30  . Abdominal hysterectomy    . Video bronchoscopy with endobronchial ultrasound N/A 05/13/2013     Procedure: VIDEO BRONCHOSCOPY WITH ENDOBRONCHIAL ULTRASOUND;  Surgeon: Loreli Slot, MD;  Location: [facility] OR;  Service: Thoracic;  Laterality: N/A;  . Mediastinoscopy N/A 05/13/2013    Procedure: MEDIASTINOSCOPY;  Surgeon: Loreli Slot, MD;  Location: [facility] OR;  Service: Thoracic;  Laterality: N/A;   Family History  Problem Relation Age of Onset  . Heart defect      family history   . Asthma      family history   . Colon polyps Mother    History  Substance Use Topics  . Smoking status: Former Smoker -- 0.50 packs/day    Types: Cigarettes    Quit date: 04/19/2013  . Smokeless tobacco: Not on file  . Alcohol Use: Yes     Comment: one glass every three weeks   No OB history provided.   Review of Systems  Unable to perform ROS: Mental status change    Allergies  Iodine and Shellfish-derived products  Home Medications   Current Outpatient Rx  Name  Route  Sig  Dispense  Refill  . albuterol (PROVENTIL HFA;VENTOLIN HFA) 108 (90 BASE) MCG/ACT inhaler   Inhalation   Inhale 2 puffs into the lungs every 6 (six) hours as needed for wheezing.         Marland Kitchen allopurinol (ZYLOPRIM) 300 MG tablet   Oral   Take 1 tablet (300 mg  total) by mouth daily.   30 tablet   1   . amLODipine (NORVASC) 5 MG tablet   Oral   Take 1 tablet (5 mg total) by mouth daily.   30 tablet   0   . guaiFENesin-dextromethorphan (ROBITUSSIN DM) 100-10 MG/5ML syrup   Oral   Take 5 mLs by mouth every 4 (four) hours as needed for cough.   118 mL   0   . ibuprofen (ADVIL,MOTRIN) 200 MG tablet   Oral   Take 400 mg by mouth every 6 (six) hours as needed for pain.         Marland Kitchen lidocaine-prilocaine (EMLA) cream      Apply to port site one hour before treatment and cover with plastic wrap   1 each   3   . metoprolol tartrate (LOPRESSOR) 25 MG tablet   Oral   Take 1 tablet (25 mg total) by mouth 3 (three) times daily.   90 tablet   0   . montelukast (SINGULAIR) 10 MG tablet   Oral   Take 10  mg by mouth daily as needed (Allergies).          . nystatin (MYCOSTATIN) powder      Apply to affected skin folds two to three times per day.   15 g   1   . ondansetron (ZOFRAN) 8 MG tablet   Oral   Take 1 tablet (8 mg total) by mouth every 12 (twelve) hours as needed for nausea.   30 tablet   2   . oxyCODONE (OXY IR/ROXICODONE) 5 MG immediate release tablet   Oral   Take 1 tablet (5 mg total) by mouth every 4 (four) hours as needed.   60 tablet   0   . pantoprazole (PROTONIX) 40 MG tablet   Oral   Take 1 tablet (40 mg total) by mouth daily.   30 tablet   1   . predniSONE (DELTASONE) 20 MG tablet      Take 5 tablets (100 mg) daily for 5 days with each round of chemotherapy   125 tablet   1   . prochlorperazine (COMPAZINE) 10 MG tablet   Oral   Take 1 tablet (10 mg total) by mouth every 6 (six) hours as needed (nausea).   30 tablet   2   . rosuvastatin (CRESTOR) 10 MG tablet   Oral   Take 10 mg by mouth daily.         Marland Kitchen senna (SENOKOT) 8.6 MG TABS   Oral   Take 1 tablet (8.6 mg total) by mouth at bedtime.   120 each   0    Triage Vitals: BP 129/97  Pulse 105  Resp 21  SpO2 100% Physical Exam  Nursing note and vitals reviewed. Constitutional: She appears well-developed and well-nourished.  HENT:  Head: Normocephalic and atraumatic.  Eyes: Conjunctivae are normal.  Neck: Normal range of motion. Neck supple.  Cardiovascular: Normal rate, regular rhythm and normal heart sounds.   Pulmonary/Chest: Effort normal and breath sounds normal.  Abdominal: Soft. Bowel sounds are normal.  Musculoskeletal: Normal range of motion.  Skin: Skin is warm and dry.    ED Course  Procedures (including critical care time)  DIAGNOSTIC STUDIES: Oxygen Saturation is 100% on room air, normal by my interpretation.    COORDINATION OF CARE:  11:06 AM- Discussed treatment plan with patient, and the patient agreed to the plan.   11:59 AM- Pt's blood glucose was 1763.  Ordered IV saline.   Labs Review Labs Reviewed  CBC WITH DIFFERENTIAL - Abnormal; Notable for the following:    HCT 46.9 (*)    MCV 106.1 (*)    MCHC 26.7 (*)    RDW 18.0 (*)    Neutrophils Relative % 95 (*)    Lymphocytes Relative 3 (*)    Monocytes Relative 1 (*)    Lymphs Abs 0.1 (*)    All other components within normal limits  BASIC METABOLIC PANEL - Abnormal; Notable for the following:    Potassium 6.3 (*)    Chloride 93 (*)    CO2 17 (*)    Glucose, Bld 1763 (*)    BUN 78 (*)    Creatinine, Ser 2.76 (*)    Calcium 10.7 (*)    GFR calc non Af Amer 19 (*)    GFR calc Af Amer 22 (*)    All other components within normal limits  URINALYSIS, ROUTINE W REFLEX MICROSCOPIC - Abnormal; Notable for the following:    Specific Gravity, Urine <1.005 (*)    Glucose, UA >1000 (*)    Hgb urine dipstick TRACE (*)    Ketones, ur 15 (*)    All other components within normal limits  GLUCOSE, CAPILLARY - Abnormal; Notable for the following:    Glucose-Capillary >600 (*)    All other components within normal limits  URINE MICROSCOPIC-ADD ON - Abnormal; Notable for the following:    Bacteria, UA FEW (*)    Casts GRANULAR CAST (*)    All other components within normal limits  CULTURE, BLOOD (ROUTINE X 2)  CULTURE, BLOOD (ROUTINE X 2)   Imaging Review Dg Chest Portable 1 View  08/21/2013   *RADIOLOGY REPORT*  Clinical Data: Altered mental status.  PORTABLE CHEST - 1 VIEW  Comparison: 05/20/2013  Findings: Right IJ Port-A-Cath has tip overlying the cavoatrial junction.  Lungs are hypoinflated without focal consolidation or effusion.  Cardiomediastinal silhouette and remainder of the exam is unchanged.  IMPRESSION: Hypoinflation without acute cardiopulmonary disease.  Right IJ Port-A-Cath with tip over the cavoatrial junction.   Original Report Authenticated By: Elberta Fortis, M.D.  Ct Head Wo Contrast  08/21/2013   *RADIOLOGY REPORT*  Clinical Data: Altered mental status.  History of lung and  kidney cancer.  CT HEAD WITHOUT CONTRAST  Technique:  Contiguous axial images were obtained from the base of the skull through the vertex without contrast.  Comparison: No priors.  Findings: No acute intracranial abnormalities.  Specifically, no evidence of acute intracranial hemorrhage, no definite findings of acute/subacute cerebral ischemia, no mass, mass effect, hydrocephalus or abnormal intra or extra-axial fluid collections. Visualized paranasal sinuses and mastoids are well pneumatized.  No acute displaced skull fractures are identified.  IMPRESSION: 1.  No acute intracranial abnormalities. 2.  The appearance of the brain is normal.   Original Report Authenticated By: Trudie Reed, M.D.   Dg Chest Portable 1 View  08/21/2013   *RADIOLOGY REPORT*  Clinical Data: Altered mental status.  PORTABLE CHEST - 1 VIEW  Comparison: 05/20/2013  Findings: Right IJ Port-A-Cath has tip overlying the cavoatrial junction.  Lungs are hypoinflated without focal consolidation or effusion.  Cardiomediastinal silhouette and remainder of the exam is unchanged.  IMPRESSION: Hypoinflation without acute cardiopulmonary disease.  Right IJ Port-A-Cath with tip over the cavoatrial junction.   Original Report Authenticated By: Elberta Fortis, M.D.    Date: 08/21/2013  Rate: 105  Rhythm: sinus tachy  QRS Axis: normal  Intervals: normal  ST/T Wave abnormalities: normal  Conduction Disutrbances: none  Narrative Interpretation: unremarkable  CRITICAL CARE Performed by: Megan Mcknight Total critical care time: 45 Critical care time was exclusive of separately billable procedures and treating other patients. Critical care was necessary to treat or prevent imminent or life-threatening deterioration. Critical care was time spent personally by me on the following activities: development of treatment plan with patient and/or surrogate as well as nursing, discussions with consultants, evaluation of patient's response to treatment,  examination of patient, obtaining history from patient or surrogate, ordering and performing treatments and interventions, ordering and review of laboratory studies, ordering and review of radiographic studies, pulse oximetry and re-evaluation of patient's condition.  MDM  No diagnosis found. Profound hyperglycemia with mild to moderate acidosis.   Patient has lymphoma which is being treated aggressively with chemotherapy.   Vigorous IV hydration. Glucose stabilizer.  Admit to intensive care.  I personally performed the services described in this documentation, which was scribed in my presence. The recorded information has been reviewed and is accurate.    Megan Hutching, MD 08/21/13 1349

## 2013-08-21 NOTE — H&P (Addendum)
History and Physical  Megan Mcknight WGN:[id number] DOB: January 21, 1961 DOA: 08/21/2013  Referring physician: Donnetta Hutching, MD PCP: [name], MD   Chief Complaint: Megan Mcknight  HPI:  52 year old woman with history of anaplastic large cell lymphoma currently undergoing cycle 5 of CHOP who has had progressive lethargy, confusion and poor oral intake for the last 4 days status post-chemotherapy. Initial evaluation in the emergency department was notable for critically ill patient unresponsive with severe hyperglycemia, metabolic acidosis, acute renal failure, hyperkalemia.  History obtained from chart, husband and sister at bedside. Patient can provide no information. She had to day treatment with chemotherapy 9/3 and 9/4. Up to that point she had been doing well at home. 9/5 the patient started to develop progressive lethargy, generalized weakness and poor oral intake. This progressed over the next several days to the present day, she has had very little to eat but has been drinking. She has been essentially nonverbal for several days and spending most of the time in bed or in a chair. She has no history of diabetes, chart review notable for blood sugar 269 07/2013  Emergency Department noted to be afebrile with stable vital signs. Urine output 1200 mL. Potassium 6.3. BUN 78, creatinine 2.76. Glucose 1763. Urinalysis unremarkable. Chest x-ray and CT head negative. Arterial pH 7.22, PCO2 35.  Review of Systems:  Unobtainable except as in history of present illness.  Past Medical History  Diagnosis Date  . Hyperlipidemia   . Nerve pain     Left leg  . Seasonal allergies   . Acute bronchitis   . Colon polyps 02/2013    Per colonoscopy  . Diverticulosis 02/2013    Per colonoscopy  . Former light tobacco smoker   . Dysrhythmia   . Cancer     lung and kidney  . Anaplastic large cell lymphoma     Past Surgical History  Procedure Laterality Date  . Partial hysterectomy    . Bones removed from baby  toes    . Colonoscopy  02/16/2012    Procedure: COLONOSCOPY;  Surgeon: Corbin Ade, MD;  Location: [facility];  Service: Endoscopy;  Laterality: N/A;  1:30  . Abdominal hysterectomy    . Video bronchoscopy with endobronchial ultrasound N/A 05/13/2013    Procedure: VIDEO BRONCHOSCOPY WITH ENDOBRONCHIAL ULTRASOUND;  Surgeon: Loreli Slot, MD;  Location: [facility] OR;  Service: Thoracic;  Laterality: N/A;  . Mediastinoscopy N/A 05/13/2013    Procedure: MEDIASTINOSCOPY;  Surgeon: Loreli Slot, MD;  Location: [facility] OR;  Service: Thoracic;  Laterality: N/A;    Social History:  reports that she quit smoking about 4 months ago. Her smoking use included Cigarettes. She smoked 0.50 packs per day. She does not have any smokeless tobacco history on file. She reports that she does not drink alcohol or use illicit drugs.  Allergies  Allergen Reactions  . Iodine Anaphylaxis    Told to avoid due to shellfish allergy.  Marland Kitchen Shellfish-Derived Products Anaphylaxis and Swelling    Eyes swelling, scratchy throat, bumps on lips.    Family History  Problem Relation Age of Onset  . Heart defect      family history   . Asthma      family history   . Colon polyps Mother      Prior to Admission medications   Medication Sig Start Date End Date Taking? Authorizing Provider  albuterol (PROVENTIL HFA;VENTOLIN HFA) 108 (90 BASE) MCG/ACT inhaler Inhale 2 puffs into the lungs every 6 (six) hours  as needed for wheezing.   Yes Historical Provider, MD  allopurinol (ZYLOPRIM) 300 MG tablet Take 1 tablet (300 mg total) by mouth daily. 07/04/13  Yes Zachery Dakins, MD  amLODipine (NORVASC) 5 MG tablet Take 1 tablet (5 mg total) by mouth daily. 05/24/13  Yes Laveda Norman, MD  ibuprofen (ADVIL,MOTRIN) 200 MG tablet Take 400 mg by mouth every 6 (six) hours as needed for pain.   Yes Historical Provider, MD  lidocaine-prilocaine (EMLA) cream Apply to port site one hour before treatment and cover with plastic wrap 06/13/13   Yes Samul Dada, MD  metoprolol tartrate (LOPRESSOR) 25 MG tablet Take 1 tablet (25 mg total) by mouth 3 (three) times daily. 05/24/13  Yes Laveda Norman, MD  montelukast (SINGULAIR) 10 MG tablet Take 10 mg by mouth daily as needed (Allergies).    Yes Historical Provider, MD  pantoprazole (PROTONIX) 40 MG tablet Take 1 tablet (40 mg total) by mouth daily. 07/04/13  Yes Zachery Dakins, MD  predniSONE (DELTASONE) 20 MG tablet Take 5 tablets (100 mg) daily for 5 days with each round of chemotherapy 06/08/13  Yes Samul Dada, MD  prochlorperazine (COMPAZINE) 10 MG tablet Take 1 tablet (10 mg total) by mouth every 6 (six) hours as needed (nausea). 06/08/13  Yes Gerarda Fraction Murinson, MD  rosuvastatin (CRESTOR) 10 MG tablet Take 10 mg by mouth daily.   Yes Historical Provider, MD   Physical Exam: Filed Vitals:   08/21/13 1300 08/21/13 1315 08/21/13 1345 08/21/13 1400  BP: 103/58 115/68 108/72 96/68  Pulse:   98   Temp:      TempSrc:      Resp: 25 24 18 20   SpO2:   100%    General: Examined in the emergency department. Appears encephalopathic, unresponsive. Does not respond to voice or tactile stimulus. She appears critically ill. She is maintaining her airway. Eyes: PERRL, normal lids, irises  ENT: Cannot assess hearing. Lips and tongue appear unremarkable. Neck: no LAD, masses or thyromegaly Cardiovascular: Tachycardic, regular rhythm. No murmur, rub, gallop. No lower extremity edema. Respiratory: CTA bilaterally, no w/r/r. Normal respiratory effort. Abdomen: soft, ntnd, no masses appreciated Skin: no rash or induration seen Musculoskeletal: Moves all extremities spontaneously, does not follow commands. Appears generally weak. Psychiatric: Cannot assess mood or affect. Neurologic: grossly non-focal but exam is severely limited   Wt Readings from Last 3 Encounters:  08/16/13 98.158 kg (216 lb 6.4 oz)  07/25/13 103.738 kg (228 lb 11.2 oz)  07/04/13 101.243 kg (223 lb 3.2 oz)    Labs  on Admission:  Basic Metabolic Panel:  Recent Labs Lab 08/21/13 1112  NA 138  K 6.3*  CL 93*  CO2 17*  GLUCOSE 1763*  BUN 78*  CREATININE 2.76*  CALCIUM 10.7*    CBC:  Recent Labs Lab 08/16/13 0920 08/21/13 1112  WBC 4.5 4.9  NEUTROABS 2.5 4.6  HGB 10.5* 12.5  HCT 32.1* 46.9*  MCV 83.6 106.1*  PLT 259 PLATELET CLUMPS NOTED ON SMEAR, COUNT APPEARS ADEQUATE   ABG    Component Value Date/Time   PHART 7.222* 08/21/2013 1405   PCO2ART 35.4 08/21/2013 1405   PO2ART 136.0* 08/21/2013 1405   HCO3 14.0* 08/21/2013 1405   TCO2 13.3 08/21/2013 1405   ACIDBASEDEF 12.2* 08/21/2013 1405   O2SAT 98.1 08/21/2013 1405   CBG:  Recent Labs Lab 08/21/13 1054 08/21/13 1340  GLUCAP >600* >600*   Radiological Exams on Admission: Ct Head Wo Contrast  08/21/2013   *  RADIOLOGY REPORT*  Clinical Data: Altered mental status.  History of lung and kidney cancer.  CT HEAD WITHOUT CONTRAST  Technique:  Contiguous axial images were obtained from the base of the skull through the vertex without contrast.  Comparison: No priors.  Findings: No acute intracranial abnormalities.  Specifically, no evidence of acute intracranial hemorrhage, no definite findings of acute/subacute cerebral ischemia, no mass, mass effect, hydrocephalus or abnormal intra or extra-axial fluid collections. Visualized paranasal sinuses and mastoids are well pneumatized.  No acute displaced skull fractures are identified.  IMPRESSION: 1.  No acute intracranial abnormalities. 2.  The appearance of the brain is normal.   Original Report Authenticated By: Trudie Reed, M.D.   Dg Chest Portable 1 View  08/21/2013   *RADIOLOGY REPORT*  Clinical Data: Altered mental status.  PORTABLE CHEST - 1 VIEW  Comparison: 05/20/2013  Findings: Right IJ Port-A-Cath has tip overlying the cavoatrial junction.  Lungs are hypoinflated without focal consolidation or effusion.  Cardiomediastinal silhouette and remainder of the exam is unchanged.  IMPRESSION:  Hypoinflation without acute cardiopulmonary disease.  Right IJ Port-A-Cath with tip over the cavoatrial junction.   Original Report Authenticated By: Elberta Fortis, M.D.    EKG: Independently reviewed. Sinus tachycardia, peaked T waves.   Principal Problem:   Hyperglycemia without ketosis Active Problems:   Other malignant lymphomas, unspecified site, extranodal and solid organ sites   Tumor lysis syndrome   Acute renal failure   Acute encephalopathy   Hyperkalemia   Assessment/Plan 1. Severe hyperglycemic state with coma, AG-metabolic acidosis: Glucose infusion, aggressive IV fluids. Review of records suggests underlying diagnosis of diabetes. Her chemotherapy regimen includes prednisone which may have exacerbated this. Plan IV hydration, insulin, frequent chemistry panel monitoring. 2. Hyperkalemia/EKG changes: Recheck stat BMP. Albuterol, insulin, calcium gluconate. Kayexalate when able if still needed. 3. Acute renal failure: Favored to be secondary to severe hyperglycemia resulting in volume loss, tumor lysis syndrome is not favored. 4. Acute encephalopathy: History and clinical findings most suggestive of hyperglycemia as etiology. No history of antecedent infection or other localizing complaints. Treatment as above.  5. Possible tumor lysis syndrome:  Discussed with Dr. Clelia Croft. His clinical impression is that the patient's electrolyte arrangements and renal failure are more likely secondary to severe hyperglycemia or rather than tumor lysis syndrome. While this remains in the differential, is quite unusual to develop tumor lysis syndrome after 5 cycles of chemotherapy. Regardless at this point treatment is the same pending further laboratory results. Aggressive IV fluids, serial electrolyte and uric acid measurements. 6. 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  Discussed with husband, sister, and at bedside. Communicated results of laboratory studies, imaging, EKG and clinical impressions as above. Communicated that the patient is critically ill with guarded prognosis due to severe hyperglycemia, metabolic abnormalities, encephalopathy and renal failure. We discussed admission and treatment recommendations. They understood the gravity of her illness.  ADDENDUM 1350: Further laboratory studies notable for hyperuricemia, hyperphosphatemia and hypermagnesemia. Potassium trending towards normal at 5.4. Creatinine with significant improvement. Case discussed with nephrology Dr. Kristian Covey, while TLS is quite possible, treatment should be conservative at this point given good urine output, improvement in renal function. Continue close laboratory monitoring. If renal function fails to improve, oliguria develops or condition worsens then consideration would  be given to nephrology consultation.  Code Status: Full code  DVT prophylaxis:SCDs Family Communication: As above Disposition Plan/Anticipated LOS:  Admit. 3-5 days.  Time spent: 75 minutes  Brendia Sacks, MD  [facility] Pager [phone] 08/21/2013, 2:06 PM

## 2013-08-21 NOTE — Progress Notes (Signed)
Pt had a critical Na of 165. Dr. Orvan Falconer notified, and orders were given to increase D5 to 200 mL/hr. At the next lab draw, the Na had decreased to 161. MD notified, with no new orders given at this point.

## 2013-08-22 DIAGNOSIS — R7989 Other specified abnormal findings of blood chemistry: Secondary | ICD-10-CM

## 2013-08-22 DIAGNOSIS — E119 Type 2 diabetes mellitus without complications: Secondary | ICD-10-CM

## 2013-08-22 DIAGNOSIS — IMO0002 Reserved for concepts with insufficient information to code with codable children: Secondary | ICD-10-CM

## 2013-08-22 DIAGNOSIS — B370 Candidal stomatitis: Secondary | ICD-10-CM

## 2013-08-22 LAB — GLUCOSE, CAPILLARY
Glucose-Capillary: 145 mg/dL — ABNORMAL HIGH (ref 70–99)
Glucose-Capillary: 149 mg/dL — ABNORMAL HIGH (ref 70–99)
Glucose-Capillary: 156 mg/dL — ABNORMAL HIGH (ref 70–99)
Glucose-Capillary: 170 mg/dL — ABNORMAL HIGH (ref 70–99)
Glucose-Capillary: 172 mg/dL — ABNORMAL HIGH (ref 70–99)
Glucose-Capillary: 181 mg/dL — ABNORMAL HIGH (ref 70–99)
Glucose-Capillary: 187 mg/dL — ABNORMAL HIGH (ref 70–99)
Glucose-Capillary: 204 mg/dL — ABNORMAL HIGH (ref 70–99)
Glucose-Capillary: 210 mg/dL — ABNORMAL HIGH (ref 70–99)
Glucose-Capillary: 224 mg/dL — ABNORMAL HIGH (ref 70–99)
Glucose-Capillary: 226 mg/dL — ABNORMAL HIGH (ref 70–99)
Glucose-Capillary: 227 mg/dL — ABNORMAL HIGH (ref 70–99)
Glucose-Capillary: 231 mg/dL — ABNORMAL HIGH (ref 70–99)
Glucose-Capillary: 257 mg/dL — ABNORMAL HIGH (ref 70–99)
Glucose-Capillary: 275 mg/dL — ABNORMAL HIGH (ref 70–99)
Glucose-Capillary: 276 mg/dL — ABNORMAL HIGH (ref 70–99)
Glucose-Capillary: 282 mg/dL — ABNORMAL HIGH (ref 70–99)
Glucose-Capillary: 297 mg/dL — ABNORMAL HIGH (ref 70–99)
Glucose-Capillary: 308 mg/dL — ABNORMAL HIGH (ref 70–99)
Glucose-Capillary: 308 mg/dL — ABNORMAL HIGH (ref 70–99)
Glucose-Capillary: 321 mg/dL — ABNORMAL HIGH (ref 70–99)
Glucose-Capillary: 340 mg/dL — ABNORMAL HIGH (ref 70–99)
Glucose-Capillary: 377 mg/dL — ABNORMAL HIGH (ref 70–99)

## 2013-08-22 LAB — CBC WITH DIFFERENTIAL/PLATELET
Basophils Absolute: 0 10*3/uL (ref 0.0–0.1)
Basophils Relative: 1 % (ref 0–1)
Eosinophils Absolute: 0 10*3/uL (ref 0.0–0.7)
Eosinophils Relative: 0 % (ref 0–5)
HCT: 37.1 % (ref 36.0–46.0)
Hemoglobin: 11.5 g/dL — ABNORMAL LOW (ref 12.0–15.0)
Lymphocytes Relative: 56 % — ABNORMAL HIGH (ref 12–46)
Lymphs Abs: 0.4 10*3/uL — ABNORMAL LOW (ref 0.7–4.0)
MCH: 27.4 pg (ref 26.0–34.0)
MCHC: 31 g/dL (ref 30.0–36.0)
MCV: 88.5 fL (ref 78.0–100.0)
Monocytes Absolute: 0 10*3/uL — ABNORMAL LOW (ref 0.1–1.0)
Monocytes Relative: 1 % — ABNORMAL LOW (ref 3–12)
Neutro Abs: 0.3 10*3/uL — ABNORMAL LOW (ref 1.7–7.7)
Neutrophils Relative %: 41 % — ABNORMAL LOW (ref 43–77)
Platelets: 76 10*3/uL — ABNORMAL LOW (ref 150–400)
RBC: 4.19 MIL/uL (ref 3.87–5.11)
RDW: 17.2 % — ABNORMAL HIGH (ref 11.5–15.5)
WBC: 0.8 10*3/uL — CL (ref 4.0–10.5)

## 2013-08-22 LAB — BASIC METABOLIC PANEL
BUN: 65 mg/dL — ABNORMAL HIGH (ref 6–23)
BUN: 61 mg/dL — ABNORMAL HIGH (ref 6–23)
BUN: 44 mg/dL — ABNORMAL HIGH (ref 6–23)
CO2: 27 mEq/L (ref 19–32)
CO2: 27 mEq/L (ref 19–32)
CO2: 29 mEq/L (ref 19–32)
Calcium: 10.4 mg/dL (ref 8.4–10.5)
Calcium: 10.3 mg/dL (ref 8.4–10.5)
Calcium: 9.7 mg/dL (ref 8.4–10.5)
Chloride: 124 mEq/L — ABNORMAL HIGH (ref 96–112)
Chloride: 124 mEq/L — ABNORMAL HIGH (ref 96–112)
Chloride: 110 mEq/L (ref 96–112)
Creatinine, Ser: 1.6 mg/dL — ABNORMAL HIGH (ref 0.50–1.10)
Creatinine, Ser: 1.59 mg/dL — ABNORMAL HIGH (ref 0.50–1.10)
Creatinine, Ser: 1.2 mg/dL — ABNORMAL HIGH (ref 0.50–1.10)
GFR calc Af Amer: 42 mL/min — ABNORMAL LOW (ref >90)
GFR calc Af Amer: 42 mL/min — ABNORMAL LOW (ref >90)
GFR calc Af Amer: 60 mL/min — ABNORMAL LOW (ref >90)
GFR calc non Af Amer: 36 mL/min — ABNORMAL LOW (ref >90)
GFR calc non Af Amer: 37 mL/min — ABNORMAL LOW (ref >90)
GFR calc non Af Amer: 51 mL/min — ABNORMAL LOW (ref >90)
Glucose, Bld: 296 mg/dL — ABNORMAL HIGH (ref 70–99)
Glucose, Bld: 247 mg/dL — ABNORMAL HIGH (ref 70–99)
Glucose, Bld: 172 mg/dL — ABNORMAL HIGH (ref 70–99)
Potassium: 3.8 mEq/L (ref 3.5–5.1)
Potassium: 3.8 mEq/L (ref 3.5–5.1)
Potassium: 2.6 mEq/L — CL (ref 3.5–5.1)
Sodium: 161 mEq/L (ref 135–145)
Sodium: 161 mEq/L (ref 135–145)
Sodium: 150 mEq/L — ABNORMAL HIGH (ref 135–145)

## 2013-08-22 LAB — CBC
HCT: 37.3 % (ref 36.0–46.0)
Hemoglobin: 11.6 g/dL — ABNORMAL LOW (ref 12.0–15.0)
MCH: 27.4 pg (ref 26.0–34.0)
MCHC: 31.1 g/dL (ref 30.0–36.0)
MCV: 88.2 fL (ref 78.0–100.0)
Platelets: 90 10*3/uL — ABNORMAL LOW (ref 150–400)
RBC: 4.23 MIL/uL (ref 3.87–5.11)
RDW: 17.4 % — ABNORMAL HIGH (ref 11.5–15.5)
WBC: 1.1 10*3/uL — CL (ref 4.0–10.5)

## 2013-08-22 LAB — URINALYSIS, ROUTINE W REFLEX MICROSCOPIC
Bilirubin Urine: NEGATIVE
Glucose, UA: NEGATIVE mg/dL
Ketones, ur: NEGATIVE mg/dL
Leukocytes, UA: NEGATIVE
Nitrite: NEGATIVE
Protein, ur: NEGATIVE mg/dL
Specific Gravity, Urine: 1.015 (ref 1.005–1.030)
Urobilinogen, UA: 0.2 mg/dL (ref 0.0–1.0)
pH: 5.5 (ref 5.0–8.0)

## 2013-08-22 LAB — MAGNESIUM
Magnesium: 3.4 mg/dL — ABNORMAL HIGH (ref 1.5–2.5)
Magnesium: 3.4 mg/dL — ABNORMAL HIGH (ref 1.5–2.5)
Magnesium: 2.8 mg/dL — ABNORMAL HIGH (ref 1.5–2.5)
Magnesium: 2.6 mg/dL — ABNORMAL HIGH (ref 1.5–2.5)
Magnesium: 2.5 mg/dL (ref 1.5–2.5)
Magnesium: 2.2 mg/dL (ref 1.5–2.5)

## 2013-08-22 LAB — PHOSPHORUS
Phosphorus: 3 mg/dL (ref 2.3–4.6)
Phosphorus: 2.8 mg/dL (ref 2.3–4.6)
Phosphorus: 3.2 mg/dL (ref 2.3–4.6)
Phosphorus: 2.5 mg/dL (ref 2.3–4.6)
Phosphorus: 2.1 mg/dL — ABNORMAL LOW (ref 2.3–4.6)
Phosphorus: 1.9 mg/dL — ABNORMAL LOW (ref 2.3–4.6)

## 2013-08-22 LAB — HEMOGLOBIN A1C
Hgb A1c MFr Bld: 12.4 % — ABNORMAL HIGH (ref <5.7)
Mean Plasma Glucose: 309 mg/dL — ABNORMAL HIGH (ref <117)

## 2013-08-22 LAB — URINE MICROSCOPIC-ADD ON

## 2013-08-22 LAB — URIC ACID
Uric Acid, Serum: 17.4 mg/dL — ABNORMAL HIGH (ref 2.4–7.0)
Uric Acid, Serum: 16.6 mg/dL — ABNORMAL HIGH (ref 2.4–7.0)

## 2013-08-22 MED ORDER — ONDANSETRON HCL 4 MG/2ML IJ SOLN
4.0000 mg | INTRAMUSCULAR | Status: DC | PRN
  Administered 2013-08-27: 4 mg via INTRAVENOUS
  Filled 2013-08-22: qty 2
  Filled 2013-08-22: qty 4

## 2013-08-22 MED ORDER — ONDANSETRON HCL 4 MG PO TABS: 4.0000 mg | ORAL_TABLET | ORAL | Status: DC | PRN

## 2013-08-22 MED ORDER — SODIUM CHLORIDE 0.9 % IV SOLN
7.5000 mg | Freq: Once | INTRAVENOUS | Status: AC
  Administered 2013-08-22: 7.5 mg via INTRAVENOUS
  Filled 2013-08-22: qty 5

## 2013-08-22 MED ORDER — FUROSEMIDE 10 MG/ML IJ SOLN
40.0000 mg | Freq: Two times a day (BID) | INTRAMUSCULAR | Status: DC
  Administered 2013-08-22 (×2): 40 mg via INTRAVENOUS
  Filled 2013-08-22 (×3): qty 4

## 2013-08-22 MED ORDER — NYSTATIN 100000 UNIT/ML MT SUSP
5.0000 mL | Freq: Four times a day (QID) | OROMUCOSAL | Status: DC
  Administered 2013-08-22 – 2013-08-29 (×11): 500000 [IU] via ORAL
  Filled 2013-08-22 (×12): qty 5

## 2013-08-22 MED ORDER — POTASSIUM CHLORIDE 10 MEQ/100ML IV SOLN
10.0000 meq | INTRAVENOUS | Status: DC
  Administered 2013-08-22: 10 meq via INTRAVENOUS
  Filled 2013-08-22: qty 100
  Filled 2013-08-22: qty 500

## 2013-08-22 MED ORDER — ONDANSETRON 8 MG/NS 50 ML IVPB
8.0000 mg | Freq: Once | INTRAVENOUS | Status: AC
  Administered 2013-08-22: 8 mg via INTRAVENOUS
  Filled 2013-08-22: qty 8

## 2013-08-22 MED ORDER — SODIUM CHLORIDE 0.9 % IV SOLN: 0.1500 mg/kg | Freq: Once | INTRAVENOUS | Status: DC

## 2013-08-22 NOTE — Progress Notes (Signed)
Pt's sodium level remains at 161 despite D5W running at 269mL/hr. Dr. Orvan Falconer notified. Orders given to continue D5W at current level and to consult nephrology.

## 2013-08-22 NOTE — Progress Notes (Deleted)
Pt's sodium level remains at 161 despite D5W running at 270mL/hr. Dr. Orvan Falconer notified. Orders given to continue D5W at current level and consult nephrology.

## 2013-08-22 NOTE — Progress Notes (Signed)
Pt has WBC of 1.1. MD notified. No new orders received.

## 2013-08-22 NOTE — Progress Notes (Signed)
eLink Physician-Brief Progress Note Patient Name: Megan Mcknight DOB: 1961-05-28 MRN: [id number]  Date of Service  08/22/2013   HPI/Events of Note  Hypokalemia  eICU Interventions  Potassium replaced   Intervention Category Intermediate Interventions: Electrolyte abnormality - evaluation and management  Ollin Hochmuth 08/22/2013, 11:31 PM

## 2013-08-22 NOTE — Progress Notes (Signed)
Subjective: Patient was admitted yesterday due to generalized weakness. She was found to have severe hyperglycemia and acute renal failure.  Objective: Vital signs in last 24 hours: Temp:  [97.6 F (36.4 C)-98.9 F (37.2 C)] 97.9 F (36.6 C) (09/08 0400) Pulse Rate:  [98-125] 115 (09/08 0600) Resp:  [9-28] 15 (09/08 0600) BP: (87-129)/(49-97) 113/85 mmHg (09/08 0600) SpO2:  [96 %-100 %] 96 % (09/08 0600) Weight:  [92.8 kg (204 lb 9.4 oz)-93.3 kg (205 lb 11 oz)] 93.3 kg (205 lb 11 oz) (09/08 0500) Weight change:  Last BM Date:  (unknown)  Intake/Output from previous day: 09/07 0701 - 09/08 0700 In: 3229.6 [I.V.:3229.6] Out: 2335 [Urine:2335]  PHYSICAL EXAM General appearance: alert, no distress and slowed mentation Resp: clear to auscultation bilaterally Cardio: S1, S2 normal GI: soft, non-tender; bowel sounds normal; no masses,  no organomegaly Extremities: extremities normal, atraumatic, no cyanosis or edema  Lab Results:    @labtest @ [facility]  Recent Labs  08/21/13 1405  PHART 7.222*  PO2ART 136.0*  TCO2 13.3  HCO3 14.0*   CULTURES Recent Results (from the past 240 hour(s))  CULTURE, BLOOD (ROUTINE X 2)     Status: None   Collection Time    08/21/13 11:20 AM      Result Value Range Status   Specimen Description BLOOD PORT   Final   Special Requests     Final   Value: BOTTLES DRAWN AEROBIC AND ANAEROBIC AEB 10CC ANA 6CC   Culture PENDING   Incomplete   Report Status PENDING   Incomplete  CULTURE, BLOOD (ROUTINE X 2)     Status: None   Collection Time    08/21/13 11:38 AM      Result Value Range Status   Specimen Description BLOOD RIGHT ANTECUBITAL   Final   Special Requests BOTTLES DRAWN AEROBIC AND ANAEROBIC 14CC EACH   Final   Culture PENDING   Incomplete   Report Status PENDING   Incomplete  MRSA PCR SCREENING     Status: None   Collection Time    08/21/13  3:30 PM      Result Value Range Status   MRSA by PCR NEGATIVE  NEGATIVE Final   Comment:             The GeneXpert MRSA Assay (FDA     approved for NASAL specimens     only), is one component of a     comprehensive MRSA colonization     surveillance program. It is not     intended to diagnose MRSA     infection nor to guide or     monitor treatment for     MRSA infections.   Studies/Results: Ct Head Wo Contrast  08/21/2013   *RADIOLOGY REPORT*  Clinical Data: Altered mental status.  History of lung and kidney cancer.  CT HEAD WITHOUT CONTRAST  Technique:  Contiguous axial images were obtained from the base of the skull through the vertex without contrast.  Comparison: No priors.  Findings: No acute intracranial abnormalities.  Specifically, no evidence of acute intracranial hemorrhage, no definite findings of acute/subacute cerebral ischemia, no mass, mass effect, hydrocephalus or abnormal intra or extra-axial fluid collections. Visualized paranasal sinuses and mastoids are well pneumatized.  No acute displaced skull fractures are identified.  IMPRESSION: 1.  No acute intracranial abnormalities. 2.  The appearance of the brain is normal.   Original Report Authenticated By: Trudie Reed, M.D.   Dg Chest Portable 1 View  08/21/2013   *  RADIOLOGY REPORT*  Clinical Data: Altered mental status.  PORTABLE CHEST - 1 VIEW  Comparison: 05/20/2013  Findings: Right IJ Port-A-Cath has tip overlying the cavoatrial junction.  Lungs are hypoinflated without focal consolidation or effusion.  Cardiomediastinal silhouette and remainder of the exam is unchanged.  IMPRESSION: Hypoinflation without acute cardiopulmonary disease.  Right IJ Port-A-Cath with tip over the cavoatrial junction.   Original Report Authenticated By: Elberta Fortis, M.D.    Medications: I have reviewed the patient's current medications.  Assesment: Principal Problem:   Hyperglycemia without ketosis Active Problems:   Other malignant lymphomas, unspecified site, extranodal and solid organ sites   Tumor lysis syndrome   Acute renal  failure   Acute encephalopathy   Hyperkalemia severe leucopenia    Plan: Will continue insulin therapy and iv hydration Nephrology consult Will CBC with diff Oncology consult.    LOS: 1 day   Asmi Fugere 08/22/2013, 8:25 AM

## 2013-08-22 NOTE — Consult Note (Signed)
Reason for Consult: Acute kidney injury, hypernatremia Referring Physician: Dr. Liliane Mcknight is an 52 y.o. female.  HPI: She is a patient who has anaplastic large cell lymphoma status post 5 cycles of CHOP was presently her growth because of increase somnolence. When she was evaluated patient was found to have severe hyperglycemia and acute kidney injury. Presently patient is somnolent but arousable. She answers very few questions by saying no weakness and drift back to sleeping. Presently her son with her cannot find any additional information as he came out of state. Presently her last chemotherapy seems to be either Wednesday or Thursday.  Past Medical History  Diagnosis Date  . Hyperlipidemia   . Nerve pain     Left leg  . Seasonal allergies   . Acute bronchitis   . Colon polyps 02/2013    Per colonoscopy  . Diverticulosis 02/2013    Per colonoscopy  . Former light tobacco smoker   . Dysrhythmia   . Cancer     lung and kidney  . Anaplastic large cell lymphoma     Past Surgical History  Procedure Laterality Date  . Partial hysterectomy    . Bones removed from baby toes    . Colonoscopy  02/16/2012    Procedure: COLONOSCOPY;  Surgeon: Megan Ade, MD;  Location: [facility];  Service: Endoscopy;  Laterality: N/A;  1:30  . Abdominal hysterectomy    . Video bronchoscopy with endobronchial ultrasound N/A 05/13/2013    Procedure: VIDEO BRONCHOSCOPY WITH ENDOBRONCHIAL ULTRASOUND;  Surgeon: Megan Slot, MD;  Location: [facility] OR;  Service: Thoracic;  Laterality: N/A;  . Mediastinoscopy N/A 05/13/2013    Procedure: MEDIASTINOSCOPY;  Surgeon: Megan Slot, MD;  Location: [facility] OR;  Service: Thoracic;  Laterality: N/A;    Family History  Problem Relation Age of Onset  . Heart defect      family history   . Asthma      family history   . Colon polyps Mother     Social History:  reports that she quit smoking about 4 months ago. Her smoking use included Cigarettes.  She smoked 0.50 packs per day. She does not have any smokeless tobacco history on file. She reports that she does not drink alcohol or use illicit drugs.  Allergies:  Allergies  Allergen Reactions  . Iodine Anaphylaxis    Told to avoid due to shellfish allergy.  Marland Kitchen Shellfish-Derived Products Anaphylaxis and Swelling    Eyes swelling, scratchy throat, bumps on lips.    Medications: I have reviewed the patient'Mcknight current medications.  Results for orders placed during the hospital encounter of 08/21/13 (from the past 48 hour(Mcknight))  GLUCOSE, CAPILLARY     Status: Abnormal   Collection Time    08/21/13 10:54 AM      Result Value Range   Glucose-Capillary >600 (*) 70 - 99 mg/dL  CBC WITH DIFFERENTIAL     Status: Abnormal   Collection Time    08/21/13 11:12 AM      Result Value Range   WBC 4.9  4.0 - 10.5 K/uL   RBC 4.42  3.87 - 5.11 MIL/uL   Hemoglobin 12.5  12.0 - 15.0 g/dL   HCT 21.3 (*) 08.6 - 57.8 %   MCV 106.1 (*) 78.0 - 100.0 fL   MCH 28.3  26.0 - 34.0 pg   MCHC 26.7 (*) 30.0 - 36.0 g/dL   RDW 46.9 (*) 62.9 - 52.8 %   Platelets  150 - 400 K/uL   Value: PLATELET CLUMPS NOTED ON SMEAR, COUNT APPEARS ADEQUATE   Neutrophils Relative % 95 (*) 43 - 77 %   Lymphocytes Relative 3 (*) 12 - 46 %   Monocytes Relative 1 (*) 3 - 12 %   Eosinophils Relative 0  0 - 5 %   Basophils Relative 1  0 - 1 %   Neutro Abs 4.6  1.7 - 7.7 K/uL   Lymphs Abs 0.1 (*) 0.7 - 4.0 K/uL   Monocytes Absolute 0.1  0.1 - 1.0 K/uL   Eosinophils Absolute 0.0  0.0 - 0.7 K/uL   Basophils Absolute 0.1  0.0 - 0.1 K/uL   RBC Morphology TEARDROP CELLS     Comment: SPHEROCYTES   WBC Morphology TOXIC GRANULATION     Comment: MILD LEFT SHIFT (1-5% METAS, OCC MYELO, OCC BANDS)     WHITE COUNT CONFIRMED ON SMEAR   Smear Review       Value: PLATELET CLUMPS NOTED ON SMEAR, COUNT APPEARS ADEQUATE  BASIC METABOLIC PANEL     Status: Abnormal   Collection Time    08/21/13 11:12 AM      Result Value Range   Sodium 138   135 - 145 mEq/L   Potassium 6.3 (*) 3.5 - 5.1 mEq/L   Comment: CRITICAL RESULT CALLED TO, READ BACK BY AND VERIFIED WITH:     Megan Mcknight. AT 1158 ON [id number] BY Megan Mcknight.   Chloride 93 (*) 96 - 112 mEq/L   CO2 17 (*) 19 - 32 mEq/L   Glucose, Bld 1763 (*) 70 - 99 mg/dL   Comment: CRITICAL RESULT CALLED TO, READ BACK BY AND VERIFIED WITH:     Megan Mcknight. AT 1158 ON [id number] BY Megan Mcknight.   BUN 78 (*) 6 - 23 mg/dL   Creatinine, Ser 1.47 (*) 0.50 - 1.10 mg/dL   Calcium 82.9 (*) 8.4 - 10.5 mg/dL   GFR calc non Af Amer 19 (*) >90 mL/min   GFR calc Af Amer 22 (*) >90 mL/min   Comment: (NOTE)     The eGFR has been calculated using the CKD EPI equation.     This calculation has not been validated in all clinical situations.     eGFR'Mcknight persistently <90 mL/min signify possible Chronic Kidney     Disease.  CULTURE, BLOOD (ROUTINE X 2)     Status: None   Collection Time    08/21/13 11:20 AM      Result Value Range   Specimen Description BLOOD PORT     Special Requests       Value: BOTTLES DRAWN AEROBIC AND ANAEROBIC AEB 10CC ANA 6CC   Culture PENDING     Report Status PENDING    URINALYSIS, ROUTINE W REFLEX MICROSCOPIC     Status: Abnormal   Collection Time    08/21/13 11:28 AM      Result Value Range   Color, Urine YELLOW  YELLOW   APPearance CLEAR  CLEAR   Specific Gravity, Urine <1.005 (*) 1.005 - 1.030   pH 5.5  5.0 - 8.0   Glucose, UA >1000 (*) NEGATIVE mg/dL   Hgb urine dipstick TRACE (*) NEGATIVE   Bilirubin Urine NEGATIVE  NEGATIVE   Ketones, ur 15 (*) NEGATIVE mg/dL   Protein, ur NEGATIVE  NEGATIVE mg/dL   Urobilinogen, UA 0.2  0.0 - 1.0 mg/dL   Nitrite NEGATIVE  NEGATIVE   Leukocytes, UA NEGATIVE  NEGATIVE  URINE MICROSCOPIC-ADD ON  Status: Abnormal   Collection Time    08/21/13 11:28 AM      Result Value Range   WBC, UA 0-2  <3 WBC/hpf   RBC / HPF 0-2  <3 RBC/hpf   Bacteria, UA FEW (*) RARE   Casts GRANULAR CAST (*) NEGATIVE  CULTURE, BLOOD (ROUTINE X 2)     Status: None    Collection Time    08/21/13 11:38 AM      Result Value Range   Specimen Description BLOOD RIGHT ANTECUBITAL     Special Requests BOTTLES DRAWN AEROBIC AND ANAEROBIC 14CC EACH     Culture PENDING     Report Status PENDING    GLUCOSE, CAPILLARY     Status: Abnormal   Collection Time    08/21/13  1:40 PM      Result Value Range   Glucose-Capillary >600 (*) 70 - 99 mg/dL  BLOOD GAS, ARTERIAL     Status: Abnormal   Collection Time    08/21/13  2:05 PM      Result Value Range   O2 Content 2.0     Delivery systems NASAL CANNULA     pH, Arterial 7.222 (*) 7.350 - 7.450   pCO2 arterial 35.4  35.0 - 45.0 mmHg   pO2, Arterial 136.0 (*) 80.0 - 100.0 mmHg   Bicarbonate 14.0 (*) 20.0 - 24.0 mEq/L   TCO2 13.3  0 - 100 mmol/L   Acid-base deficit 12.2 (*) 0.0 - 2.0 mmol/L   O2 Saturation 98.1     Patient temperature 37.0     Collection site LEFT RADIAL     Drawn by [id number]     Sample type ARTERIAL DRAW     Allens test (pass/fail) PASS  PASS  BASIC METABOLIC PANEL     Status: Abnormal   Collection Time    08/21/13  2:17 PM      Result Value Range   Sodium 151 (*) 135 - 145 mEq/L   Comment: DELTA CHECK NOTED     RESULT REPEATED AND VERIFIED   Potassium 5.4 (*) 3.5 - 5.1 mEq/L   Chloride 111  96 - 112 mEq/L   Comment: DELTA CHECK NOTED     RESULT REPEATED AND VERIFIED   CO2 15 (*) 19 - 32 mEq/L   Glucose, Bld 1230 (*) 70 - 99 mg/dL   Comment: CRITICAL RESULT CALLED TO, READ BACK BY AND VERIFIED WITH:     PHILLIPS C. AT 1502 ON [id number] BY Megan Mcknight.   BUN 73 (*) 6 - 23 mg/dL   Creatinine, Ser 9.81 (*) 0.50 - 1.10 mg/dL   Calcium 19.1  8.4 - 47.8 mg/dL   GFR calc non Af Amer 27 (*) >90 mL/min   GFR calc Af Amer 32 (*) >90 mL/min   Comment: (NOTE)     The eGFR has been calculated using the CKD EPI equation.     This calculation has not been validated in all clinical situations.     eGFR'Mcknight persistently <90 mL/min signify possible Chronic Kidney     Disease.  MAGNESIUM     Status:  Abnormal   Collection Time    08/21/13  2:17 PM      Result Value Range   Magnesium 3.7 (*) 1.5 - 2.5 mg/dL  PHOSPHORUS     Status: Abnormal   Collection Time    08/21/13  2:17 PM      Result Value Range   Phosphorus 5.5 (*) 2.3 - 4.6 mg/dL  URIC ACID     Status: Abnormal   Collection Time    08/21/13  2:17 PM      Result Value Range   Uric Acid, Serum 18.0 (*) 2.4 - 7.0 mg/dL  LACTIC ACID, PLASMA     Status: None   Collection Time    08/21/13  2:23 PM      Result Value Range   Lactic Acid, Venous 1.8  0.5 - 2.2 mmol/L  GLUCOSE, CAPILLARY     Status: Abnormal   Collection Time    08/21/13  2:47 PM      Result Value Range   Glucose-Capillary >600 (*) 70 - 99 mg/dL   Comment 1 Notify RN    MRSA PCR SCREENING     Status: None   Collection Time    08/21/13  3:30 PM      Result Value Range   MRSA by PCR NEGATIVE  NEGATIVE   Comment:            The GeneXpert MRSA Assay (FDA     approved for NASAL specimens     only), is one component of a     comprehensive MRSA colonization     surveillance program. It is not     intended to diagnose MRSA     infection nor to guide or     monitor treatment for     MRSA infections.  GLUCOSE, CAPILLARY     Status: Abnormal   Collection Time    08/21/13  3:43 PM      Result Value Range   Glucose-Capillary >600 (*) 70 - 99 mg/dL   Comment 1 Documented in Chart     Comment 2 Notify RN    BASIC METABOLIC PANEL     Status: Abnormal   Collection Time    08/21/13  4:22 PM      Result Value Range   Sodium 159 (*) 135 - 145 mEq/L   Comment: DELTA CHECK NOTED   Potassium 4.4  3.5 - 5.1 mEq/L   Comment: DELTA CHECK NOTED   Chloride 118 (*) 96 - 112 mEq/L   CO2 19  19 - 32 mEq/L   Glucose, Bld 940 (*) 70 - 99 mg/dL   Comment: CRITICAL RESULT CALLED TO, READ BACK BY AND VERIFIED WITH:     PHILLIPS C. AT 1717 ON [id number] BY Megan Mcknight.   BUN 69 (*) 6 - 23 mg/dL   Creatinine, Ser 1.19 (*) 0.50 - 1.10 mg/dL   Calcium 14.7  8.4 - 82.9 mg/dL   GFR  calc non Af Amer 32 (*) >90 mL/min   GFR calc Af Amer 37 (*) >90 mL/min   Comment: (NOTE)     The eGFR has been calculated using the CKD EPI equation.     This calculation has not been validated in all clinical situations.     eGFR'Mcknight persistently <90 mL/min signify possible Chronic Kidney     Disease.  CK     Status: None   Collection Time    08/21/13  4:22 PM      Result Value Range   Total CK 43  7 - 177 U/L  GLUCOSE, CAPILLARY     Status: Abnormal   Collection Time    08/21/13  4:48 PM      Result Value Range   Glucose-Capillary >600 (*) 70 - 99 mg/dL  GLUCOSE, CAPILLARY     Status: Abnormal   Collection Time    08/21/13  5:49 PM      Result Value Range   Glucose-Capillary 589 (*) 70 - 99 mg/dL   Comment 1 Documented in Chart     Comment 2 Notify RN    BASIC METABOLIC PANEL     Status: Abnormal   Collection Time    08/21/13  6:25 PM      Result Value Range   Sodium 159 (*) 135 - 145 mEq/L   Potassium 3.8  3.5 - 5.1 mEq/L   Chloride 121 (*) 96 - 112 mEq/L   CO2 23  19 - 32 mEq/L   Glucose, Bld 703 (*) 70 - 99 mg/dL   Comment: CRITICAL RESULT CALLED TO, READ BACK BY AND VERIFIED WITH:     M. GRAY AT 1940 ON 08/21/13 BY Mcknight. VANHOORNE   BUN 65 (*) 6 - 23 mg/dL   Creatinine, Ser 4.09 (*) 0.50 - 1.10 mg/dL   Calcium 81.1  8.4 - 91.4 mg/dL   GFR calc non Af Amer 33 (*) >90 mL/min   GFR calc Af Amer 38 (*) >90 mL/min   Comment: (NOTE)     The eGFR has been calculated using the CKD EPI equation.     This calculation has not been validated in all clinical situations.     eGFR'Mcknight persistently <90 mL/min signify possible Chronic Kidney     Disease.  URIC ACID     Status: Abnormal   Collection Time    08/21/13  6:25 PM      Result Value Range   Uric Acid, Serum 17.5 (*) 2.4 - 7.0 mg/dL  MAGNESIUM     Status: Abnormal   Collection Time    08/21/13  6:25 PM      Result Value Range   Magnesium 3.7 (*) 1.5 - 2.5 mg/dL  PHOSPHORUS     Status: None   Collection Time    08/21/13   6:25 PM      Result Value Range   Phosphorus 4.0  2.3 - 4.6 mg/dL  GLUCOSE, CAPILLARY     Status: Abnormal   Collection Time    08/21/13  6:52 PM      Result Value Range   Glucose-Capillary >600 (*) 70 - 99 mg/dL   Comment 1 Documented in Chart     Comment 2 Notify RN    GLUCOSE, CAPILLARY     Status: Abnormal   Collection Time    08/21/13  7:55 PM      Result Value Range   Glucose-Capillary 501 (*) 70 - 99 mg/dL   Comment 1 Notify RN    BASIC METABOLIC PANEL     Status: Abnormal   Collection Time    08/21/13  8:48 PM      Result Value Range   Sodium 165 (*) 135 - 145 mEq/L   Comment: CRITICAL RESULT CALLED TO, READ BACK BY AND VERIFIED WITH:     R. WAGNER AT 2154 ON 08/21/13 BY Mcknight. VANHOORNE   Potassium 3.9  3.5 - 5.1 mEq/L   Chloride 126 (*) 96 - 112 mEq/L   CO2 25  19 - 32 mEq/L   Glucose, Bld 518 (*) 70 - 99 mg/dL   BUN 68 (*) 6 - 23 mg/dL   Creatinine, Ser 7.82 (*) 0.50 - 1.10 mg/dL   Calcium 95.6 (*) 8.4 - 10.5 mg/dL   GFR calc non Af Amer 33 (*) >90 mL/min   GFR calc Af Amer 38 (*) >90 mL/min   Comment: (NOTE)  The eGFR has been calculated using the CKD EPI equation.     This calculation has not been validated in all clinical situations.     eGFR'Mcknight persistently <90 mL/min signify possible Chronic Kidney     Disease.  GLUCOSE, CAPILLARY     Status: Abnormal   Collection Time    08/21/13  8:55 PM      Result Value Range   Glucose-Capillary 429 (*) 70 - 99 mg/dL   Comment 1 Notify RN    GLUCOSE, CAPILLARY     Status: Abnormal   Collection Time    08/21/13  9:57 PM      Result Value Range   Glucose-Capillary 384 (*) 70 - 99 mg/dL   Comment 1 Notify RN    BASIC METABOLIC PANEL     Status: Abnormal   Collection Time    08/21/13 10:35 PM      Result Value Range   Sodium 161 (*) 135 - 145 mEq/L   Comment: CRITICAL RESULT CALLED TO, READ BACK BY AND VERIFIED WITH:     M. GRAY AT 2311 ON 08/21/13 BY Mcknight. VANHOORNE   Potassium 3.9  3.5 - 5.1 mEq/L   Chloride 123 (*)  96 - 112 mEq/L   CO2 26  19 - 32 mEq/L   Glucose, Bld 449 (*) 70 - 99 mg/dL   BUN 67 (*) 6 - 23 mg/dL   Creatinine, Ser 8.11 (*) 0.50 - 1.10 mg/dL   Calcium 91.4  8.4 - 78.2 mg/dL   GFR calc non Af Amer 32 (*) >90 mL/min   GFR calc Af Amer 38 (*) >90 mL/min   Comment: (NOTE)     The eGFR has been calculated using the CKD EPI equation.     This calculation has not been validated in all clinical situations.     eGFR'Mcknight persistently <90 mL/min signify possible Chronic Kidney     Disease.  URIC ACID     Status: Abnormal   Collection Time    08/21/13 10:35 PM      Result Value Range   Uric Acid, Serum 18.3 (*) 2.4 - 7.0 mg/dL  MAGNESIUM     Status: Abnormal   Collection Time    08/21/13 10:35 PM      Result Value Range   Magnesium 3.7 (*) 1.5 - 2.5 mg/dL  PHOSPHORUS     Status: None   Collection Time    08/21/13 10:35 PM      Result Value Range   Phosphorus 3.1  2.3 - 4.6 mg/dL  GLUCOSE, CAPILLARY     Status: Abnormal   Collection Time    08/21/13 11:04 PM      Result Value Range   Glucose-Capillary 375 (*) 70 - 99 mg/dL   Comment 1 Notify RN    GLUCOSE, CAPILLARY     Status: Abnormal   Collection Time    08/21/13 11:59 PM      Result Value Range   Glucose-Capillary 340 (*) 70 - 99 mg/dL   Comment 1 Notify RN    GLUCOSE, CAPILLARY     Status: Abnormal   Collection Time    08/22/13 12:59 AM      Result Value Range   Glucose-Capillary 297 (*) 70 - 99 mg/dL  GLUCOSE, CAPILLARY     Status: Abnormal   Collection Time    08/22/13  2:00 AM      Result Value Range   Glucose-Capillary 275 (*) 70 - 99 mg/dL  Comment 1 Notify RN    URIC ACID     Status: Abnormal   Collection Time    08/22/13  2:38 AM      Result Value Range   Uric Acid, Serum 17.4 (*) 2.4 - 7.0 mg/dL  MAGNESIUM     Status: Abnormal   Collection Time    08/22/13  2:38 AM      Result Value Range   Magnesium 3.4 (*) 1.5 - 2.5 mg/dL  PHOSPHORUS     Status: None   Collection Time    08/22/13  2:38 AM       Result Value Range   Phosphorus 3.0  2.3 - 4.6 mg/dL  BASIC METABOLIC PANEL     Status: Abnormal   Collection Time    08/22/13  2:38 AM      Result Value Range   Sodium 161 (*) 135 - 145 mEq/L   Comment: CRITICAL RESULT CALLED TO, READ BACK BY AND VERIFIED WITH:      GRAY,M @ 0312 ON 08/22/13 BY WOODIE,Mcknight   Potassium 3.8  3.5 - 5.1 mEq/L   Chloride 124 (*) 96 - 112 mEq/L   CO2 27  19 - 32 mEq/L   Glucose, Bld 296 (*) 70 - 99 mg/dL   BUN 65 (*) 6 - 23 mg/dL   Creatinine, Ser 8.11 (*) 0.50 - 1.10 mg/dL   Calcium 91.4  8.4 - 78.2 mg/dL   GFR calc non Af Amer 36 (*) >90 mL/min   GFR calc Af Amer 42 (*) >90 mL/min   Comment: (NOTE)     The eGFR has been calculated using the CKD EPI equation.     This calculation has not been validated in all clinical situations.     eGFR'Mcknight persistently <90 mL/min signify possible Chronic Kidney     Disease.  GLUCOSE, CAPILLARY     Status: Abnormal   Collection Time    08/22/13  3:04 AM      Result Value Range   Glucose-Capillary 224 (*) 70 - 99 mg/dL  GLUCOSE, CAPILLARY     Status: Abnormal   Collection Time    08/22/13  4:13 AM      Result Value Range   Glucose-Capillary 231 (*) 70 - 99 mg/dL   Comment 1 Notify RN    GLUCOSE, CAPILLARY     Status: Abnormal   Collection Time    08/22/13  5:04 AM      Result Value Range   Glucose-Capillary 226 (*) 70 - 99 mg/dL   Comment 1 Notify RN    CBC     Status: Abnormal   Collection Time    08/22/13  5:45 AM      Result Value Range   WBC 1.1 (*) 4.0 - 10.5 K/uL   Comment: RESULT REPEATED AND VERIFIED     WHITE COUNT CONFIRMED ON SMEAR     CRITICAL RESULT CALLED TO, READ BACK BY AND VERIFIED WITH:     MICHAEL GRAY RN ON [id number] AT 0640 BY RESSEGGER R   RBC 4.23  3.87 - 5.11 MIL/uL   Hemoglobin 11.6 (*) 12.0 - 15.0 g/dL   HCT 08.6  57.8 - 46.9 %   MCV 88.2  78.0 - 100.0 fL   Comment: DELTA CHECK NOTED     RESULT REPEATED AND VERIFIED   MCH 27.4  26.0 - 34.0 pg   MCHC 31.1  30.0 - 36.0 g/dL   RDW  62.9 (*) 52.8 - 15.5 %  Platelets 90 (*) 150 - 400 K/uL  BASIC METABOLIC PANEL     Status: Abnormal   Collection Time    08/22/13  6:00 AM      Result Value Range   Sodium 161 (*) 135 - 145 mEq/L   Comment: CRITICAL RESULT CALLED TO, READ BACK BY AND VERIFIED WITH:     GREY,M AT 6:30AM ON 08/22/13 BY FESTERMAN,C   Potassium 3.8  3.5 - 5.1 mEq/L   Chloride 124 (*) 96 - 112 mEq/L   CO2 27  19 - 32 mEq/L   Glucose, Bld 247 (*) 70 - 99 mg/dL   BUN 61 (*) 6 - 23 mg/dL   Creatinine, Ser 1.61 (*) 0.50 - 1.10 mg/dL   Calcium 09.6  8.4 - 04.5 mg/dL   GFR calc non Af Amer 37 (*) >90 mL/min   GFR calc Af Amer 42 (*) >90 mL/min   Comment: (NOTE)     The eGFR has been calculated using the CKD EPI equation.     This calculation has not been validated in all clinical situations.     eGFR'Mcknight persistently <90 mL/min signify possible Chronic Kidney     Disease.  URIC ACID     Status: Abnormal   Collection Time    08/22/13  6:00 AM      Result Value Range   Uric Acid, Serum 16.6 (*) 2.4 - 7.0 mg/dL  MAGNESIUM     Status: Abnormal   Collection Time    08/22/13  6:00 AM      Result Value Range   Magnesium 3.4 (*) 1.5 - 2.5 mg/dL  PHOSPHORUS     Status: None   Collection Time    08/22/13  6:00 AM      Result Value Range   Phosphorus 2.8  2.3 - 4.6 mg/dL  GLUCOSE, CAPILLARY     Status: Abnormal   Collection Time    08/22/13  6:17 AM      Result Value Range   Glucose-Capillary 204 (*) 70 - 99 mg/dL    Ct Head Wo Contrast  08/21/2013   *RADIOLOGY REPORT*  Clinical Data: Altered mental status.  History of lung and kidney cancer.  CT HEAD WITHOUT CONTRAST  Technique:  Contiguous axial images were obtained from the base of the skull through the vertex without contrast.  Comparison: No priors.  Findings: No acute intracranial abnormalities.  Specifically, no evidence of acute intracranial hemorrhage, no definite findings of acute/subacute cerebral ischemia, no mass, mass effect, hydrocephalus or  abnormal intra or extra-axial fluid collections. Visualized paranasal sinuses and mastoids are well pneumatized.  No acute displaced skull fractures are identified.  IMPRESSION: 1.  No acute intracranial abnormalities. 2.  The appearance of the brain is normal.   Original Report Authenticated By: Trudie Reed, M.D.   Dg Chest Portable 1 View  08/21/2013   *RADIOLOGY REPORT*  Clinical Data: Altered mental status.  PORTABLE CHEST - 1 VIEW  Comparison: 05/20/2013  Findings: Right IJ Port-A-Cath has tip overlying the cavoatrial junction.  Lungs are hypoinflated without focal consolidation or effusion.  Cardiomediastinal silhouette and remainder of the exam is unchanged.  IMPRESSION: Hypoinflation without acute cardiopulmonary disease.  Right IJ Port-A-Cath with tip over the cavoatrial junction.   Original Report Authenticated By: Elberta Fortis, M.D.    Review of Systems  Constitutional: Positive for malaise/fatigue.  Respiratory: Negative for shortness of breath.   Cardiovascular: Negative for orthopnea.  Gastrointestinal: Negative for nausea and vomiting.  Neurological: Positive  for weakness.   Blood pressure 113/85, pulse 115, temperature 97.9 F (36.6 C), temperature source Axillary, resp. rate 15, height 5\' 8"  (1.727 m), weight 93.3 kg (205 lb 11 oz), SpO2 96.00%. Physical Exam  Eyes: No scleral icterus.  Neck: No JVD present.  Cardiovascular: Normal rate and regular rhythm.   No murmur heard. Respiratory: She has no wheezes. She has no rales.  GI: She exhibits no distension. There is no tenderness.  Musculoskeletal: She exhibits no edema.  Neurological:  Patient remains somnolent but  arousable.    Assessment/Plan: Problem #1 acute kidney injury most likely secondary to severe prerenal syndrome. As this moment chemotherapy may play some role. Since patient has hyperkalemia, hyperuricemia possibly tumor lysis syndrome may contribute to the etiology. Presently her BUN and creatinine seems  to be improving. Problem #2 hyperuricemia,. This could be secondary to tumor lysis syndrome however patient doesn't have hyperphosphatemia. She has hyperkalemia but could be secondary renal failure/hyperglycemia Problem #3 Patel fibrillation Problem #4 history of lymphoma Problem #5 severe hyperglycemia Problem #6 pancytopenia. Problem #7 hypernatremia most likely secondary lack of  freewater. Presently patient is on D5 water. Plan: We'll continue with present iv 150cc/hr. Will start patient on Lasix 40 mg IV twice a day. We'll check her basic metabolic panel, uric acid and CBC in the morning.  Megan Mcknight Mcknight 08/22/2013, 7:43 AM

## 2013-08-22 NOTE — Progress Notes (Signed)
eLink Physician-Brief Progress Note Patient Name: Megan Mcknight DOB: 17-Aug-1961 MRN: [id number]  Date of Service  08/22/2013   HPI/Events of Note   Pt with refractory nausea  eICU Interventions  Additional dose zofran given, prn dosing changed from q6h to q4h prn   Intervention Category Minor Interventions: Routine modifications to care plan (e.g. PRN medications for pain, fever)  Zuleima Haser S. 08/22/2013, 10:42 PM

## 2013-08-22 NOTE — Consult Note (Signed)
Patient History and Physical   Megan Mcknight [id number] [birth year] 51 y.o. 08/22/2013  Referring MD: Dr [name]  Reason for Consult: History of ALK + NHL  HPI:  Her records and electronic medical record system has been reviewed. 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 Her  diagnosis was made through biopsies obtained from mediastinoscopy. Bone marrow biopsy did not show bone marrow involvement by the lymphoma. However patient is suspected to have stage IV disease a IPI score of 2 according to her records.  Pretreatment  PET/ CT scan showed hypermetabolic bilateral inguinal lymph nodes, intensely metabolic activity in the axial and appendicular skeleton and diffuse tiny pulmonary nodules which did not show any hypermetabolic activity.  Patient began treatment with cyclophosphamide/Doxorubicin/vincristine/etoposide/prednisone on 05/20/2013 and as at  08/19/2013 patient has completed 5/6 planned cycles of this treatment.  She was admitted on 08/21/2013 with complaint of feeling weak and was noted to have  hyperglycemia with,  metabolic acidosis and coma.  She was also noted to be in no acute kidney injury with hyperuricemia and the possibility of a tumor lysis syndrome was raised. Blood glucose level at the time of admission was 1763 with creatinine of 2.76 and uric acid 18.  When we were initially consulted this morning we had ordered Rasburicase to be given prior to seeing her this evening.  Uric acid this morning was 16.6 with creatinine improving to 1.59 and glucose is in the 200 range. She does not recall events surrounding admission and believed that she had passed out. Patient denies any prior history of diabetes but was noted to have hemoglobin A1c of 12.4 today.  She complains of nausea and  has not been able to eat otherwise feel that she is getting better.  She denies fever or chills or lymphadenopathy.  She  states that she is scheduled to have a PET scan that was she completes treatment.    PMH: Past Medical History  Diagnosis Date  . Hyperlipidemia   . Nerve pain     Left leg  . Seasonal allergies   . Acute bronchitis   . Colon polyps 02/2013    Per colonoscopy  . Diverticulosis 02/2013    Per colonoscopy  . Former light tobacco smoker   . Dysrhythmia   . Cancer     lung and kidney  . Anaplastic large cell lymphoma     Past Surgical History  Procedure Laterality Date  . Partial hysterectomy    . Bones removed from baby toes    . Colonoscopy  02/16/2012    Procedure: COLONOSCOPY;  Surgeon: Corbin Ade, MD;  Location: [facility];  Service: Endoscopy;  Laterality: N/A;  1:30  . Abdominal hysterectomy    . Video bronchoscopy with endobronchial ultrasound N/A 05/13/2013    Procedure: VIDEO BRONCHOSCOPY WITH ENDOBRONCHIAL ULTRASOUND;  Surgeon: Loreli Slot, MD;  Location: [facility] OR;  Service: Thoracic;  Laterality: N/A;  . Mediastinoscopy N/A 05/13/2013    Procedure: MEDIASTINOSCOPY;  Surgeon: Loreli Slot, MD;  Location: [facility] OR;  Service: Thoracic;  Laterality: N/A;    Allergies: Allergies  Allergen Reactions  . Iodine Anaphylaxis    Told to avoid due to shellfish allergy.  Marland Kitchen Shellfish-Derived Products Anaphylaxis and Swelling    Eyes swelling, scratchy throat, bumps on lips.    Medications: Current facility-administered medications:acetaminophen (TYLENOL) suppository 650 mg, 650 mg, Rectal, Q6H PRN, Melton Alar  Irene Limbo, MD;  acetaminophen (TYLENOL) tablet 650 mg, 650 mg, Oral, Q6H PRN, Standley Brooking, MD, 650 mg at 08/22/13 1522;  allopurinol (ZYLOPRIM) tablet 300 mg, 300 mg, Oral, Daily, Standley Brooking, MD;  antiseptic oral rinse (BIOTENE) solution 15 mL, 15 mL, Mouth Rinse, q12n4p, Standley Brooking, MD, 15 mL at 08/22/13 1200 chlorhexidine (PERIDEX) 0.12 % solution 15 mL, 15 mL, Mouth Rinse, BID, Standley Brooking, MD, 15 mL at 08/22/13 0835;  dextrose 5 %  solution, , Intravenous, Continuous, Vania Rea, MD, Last Rate: 250 mL/hr at 08/22/13 1500;  dextrose 50 % solution 25 mL, 25 mL, Intravenous, PRN, Standley Brooking, MD;  furosemide (LASIX) injection 40 mg, 40 mg, Intravenous, BID, Jamse Mead, MD, 40 mg at 08/22/13 0835 insulin regular (NOVOLIN R,HUMULIN R) 1 Units/mL in sodium chloride 0.9 % 100 mL infusion, , Intravenous, Continuous, Standley Brooking, MD, Last Rate: 15 mL/hr at 08/22/13 1642, 15 Units/hr at 08/22/13 1642;  nystatin (MYCOSTATIN) 100000 UNIT/ML suspension 500,000 Units, 5 mL, Oral, QID, Ellouise Newer, PA-C;  ondansetron [facility]) injection 4 mg, 4 mg, Intravenous, Q6H PRN, Standley Brooking, MD, 4 mg at 08/22/13 1517 ondansetron (ZOFRAN) tablet 4 mg, 4 mg, Oral, Q6H PRN, Standley Brooking, MD;  pantoprazole (PROTONIX) injection 40 mg, 40 mg, Intravenous, QHS, Standley Brooking, MD, 40 mg at 08/21/13 1600;  sodium chloride 0.9 % injection 3 mL, 3 mL, Intravenous, Q12H, Standley Brooking, MD, 3 mL at 08/22/13 1610   Social History:   reports that she quit smoking about 4 months ago. Her smoking use included Cigarettes. She smoked 0.50 packs per day. She does not have any smokeless tobacco history on file. She reports that she does not drink alcohol or use illicit drugs.  Family History: Family History  Problem Relation Age of Onset  . Heart defect      family history   . Asthma      family history   . Colon polyps Mother     Review of Systems: 14 point review of system is as in the history above otherwise negative.   Physical Exam: Blood pressure 124/85, pulse 115, temperature 98.5 F (36.9 C), temperature source Oral, resp. rate 14, height 5\' 8"  (1.727 m), weight 205 lb 11 oz (93.3 kg), SpO2 100.00%. GENERAL: No distress, .  SKIN:  No rashes or significant lesions , no ecchymosis or petechia or rash. HEAD: Normocephalic, No masses, lesions, tenderness or abnormalities  EYES: Conjunctiva are pink ,  non-injected and no jaundice. ENT: External ears normal ,lips, buccal mucosa, and tongue normal and mucous membranes are moist . Evidence of thrush noted in the oral cavity and tongue. LYMPH: No palpable lymphadenopathy,  In the neck supraclavicular area or axilla or inguinal areas. LUNGS: Clear to auscultation , no crackles or wheezes HEART: regular rate & rhythm, no murmurs, no gallops, S1 normal and S2 normal  ABDOMEN: Abdomen soft, non-tender, normal bowel sounds, no masses or organomegaly and no hepatosplenomegaly palpable.  EXTREMITIES: No edema, no skin discoloration or tenderness. NEURO: Alert & oriented .     Lab Results: Lab Results  Component Value Date   WBC 0.8* 08/22/2013   HGB 11.5* 08/22/2013   HCT 37.1 08/22/2013   MCV 88.5 08/22/2013   PLT 76* 08/22/2013   @LASTCHEMISTRY @    Radiological Studies: Ct Head Wo Contrast  08/21/2013   IMPRESSION: 1.  No acute intracranial abnormalities. 2.  The appearance of the brain is normal.  Original Report Authenticated By: Trudie Reed, M.D.   Dg Chest Portable 1 View 08/21/2013    IMPRESSION: Hypoinflation without acute cardiopulmonary disease.  Right IJ Port-A-Cath with tip over the cavoatrial junction.   Original Report Authenticated By: Elberta Fortis, M.D.      Impression: Megan Stlouis has anaplastic large cell lymphoma ALK and CD30 positive being treated with CHOEP STATUS post 5/6 cycles.  Patient states  that she had tolerated the treatment well until this last cycle last week. From documentation in the system she is believed to be responding to treatment even though formal assessment of response is yet to be done.  From our records it appears she was in diabetic ketoacidosis and coma on admission, incidentally she was also noted to have high uric acid which  Was though to be related to possibly tumor lysis; even though this it is possible , it is unsual this late  in her treatment course.  Rasburicase has been administered based on  our orders today and we will follow uric acid level closely. Her kidney function is also improving and her blood sugar trending down.  Given  A1c of 12.4, I explained to  the patient had diagnosis of diabetes is established and it should be noted during subsequent chemotherapy which has steroid as an important component. Patient also has thrush.  Recommendations: 1. Agree with medical management by primary team as deemed appropriate. 2.  We'll follow uric acid level in the morning and  Repeat rasburicase administration if indicated. 3.  We prescribed swish and swallow nystatin liquid for thrush . 4.  We'll follow with you while patient is admitted in the hospital and make further recommendations as indicated. 5. On discharge patient will follow up with her primary oncologist in [place] and we'll leave response assessment and continuation of chemotherapy to their discretion.    All questions were satisfactorily answered.She knows to call if she has any concern.  I spent 50% of the time was spent counseling the patient face to face. The total time spent in the appointment was 60 minutes.  Thank you that Dr Ninetta Lights for the opportunity to participate in the care of this patient .  Sherral Hammers, MD FACP. Hematology/Oncology.     Marland Kitchen

## 2013-08-23 LAB — BASIC METABOLIC PANEL
BUN: 34 mg/dL — ABNORMAL HIGH (ref 6–23)
BUN: 36 mg/dL — ABNORMAL HIGH (ref 6–23)
BUN: 38 mg/dL — ABNORMAL HIGH (ref 6–23)
BUN: 40 mg/dL — ABNORMAL HIGH (ref 6–23)
CO2: 24 mEq/L (ref 19–32)
CO2: 28 mEq/L (ref 19–32)
CO2: 28 mEq/L (ref 19–32)
CO2: 28 mEq/L (ref 19–32)
Calcium: 9.2 mg/dL (ref 8.4–10.5)
Calcium: 9.3 mg/dL (ref 8.4–10.5)
Calcium: 9.6 mg/dL (ref 8.4–10.5)
Calcium: 9.8 mg/dL (ref 8.4–10.5)
Chloride: 108 mEq/L (ref 96–112)
Chloride: 109 mEq/L (ref 96–112)
Chloride: 109 mEq/L (ref 96–112)
Chloride: 110 mEq/L (ref 96–112)
Creatinine, Ser: 1.05 mg/dL (ref 0.50–1.10)
Creatinine, Ser: 1.05 mg/dL (ref 0.50–1.10)
Creatinine, Ser: 1.07 mg/dL (ref 0.50–1.10)
Creatinine, Ser: 1.14 mg/dL — ABNORMAL HIGH (ref 0.50–1.10)
GFR calc Af Amer: 63 mL/min — ABNORMAL LOW (ref >90)
GFR calc Af Amer: 68 mL/min — ABNORMAL LOW (ref >90)
GFR calc Af Amer: 70 mL/min — ABNORMAL LOW (ref >90)
GFR calc Af Amer: 70 mL/min — ABNORMAL LOW (ref >90)
GFR calc non Af Amer: 55 mL/min — ABNORMAL LOW (ref >90)
GFR calc non Af Amer: 59 mL/min — ABNORMAL LOW (ref >90)
GFR calc non Af Amer: 60 mL/min — ABNORMAL LOW (ref >90)
GFR calc non Af Amer: 60 mL/min — ABNORMAL LOW (ref >90)
Glucose, Bld: 102 mg/dL — ABNORMAL HIGH (ref 70–99)
Glucose, Bld: 125 mg/dL — ABNORMAL HIGH (ref 70–99)
Glucose, Bld: 162 mg/dL — ABNORMAL HIGH (ref 70–99)
Glucose, Bld: 165 mg/dL — ABNORMAL HIGH (ref 70–99)
Potassium: 3 mEq/L — ABNORMAL LOW (ref 3.5–5.1)
Potassium: 3.1 mEq/L — ABNORMAL LOW (ref 3.5–5.1)
Potassium: 3.6 mEq/L (ref 3.5–5.1)
Potassium: 5.7 mEq/L — ABNORMAL HIGH (ref 3.5–5.1)
Sodium: 142 mEq/L (ref 135–145)
Sodium: 145 mEq/L (ref 135–145)
Sodium: 146 mEq/L — ABNORMAL HIGH (ref 135–145)
Sodium: 146 mEq/L — ABNORMAL HIGH (ref 135–145)

## 2013-08-23 LAB — SODIUM, URINE, RANDOM: Sodium, Ur: 38 mEq/L

## 2013-08-23 LAB — CBC
HCT: 34.4 % — ABNORMAL LOW (ref 36.0–46.0)
Hemoglobin: 10.9 g/dL — ABNORMAL LOW (ref 12.0–15.0)
MCH: 27.7 pg (ref 26.0–34.0)
MCHC: 31.7 g/dL (ref 30.0–36.0)
MCV: 87.5 fL (ref 78.0–100.0)
Platelets: 39 10*3/uL — ABNORMAL LOW (ref 150–400)
RBC: 3.93 MIL/uL (ref 3.87–5.11)
RDW: 16.4 % — ABNORMAL HIGH (ref 11.5–15.5)
WBC: 0.6 10*3/uL — CL (ref 4.0–10.5)

## 2013-08-23 LAB — PHOSPHORUS
Phosphorus: 1.9 mg/dL — ABNORMAL LOW (ref 2.3–4.6)
Phosphorus: 2.2 mg/dL — ABNORMAL LOW (ref 2.3–4.6)

## 2013-08-23 LAB — GLUCOSE, CAPILLARY
Glucose-Capillary: 101 mg/dL — ABNORMAL HIGH (ref 70–99)
Glucose-Capillary: 110 mg/dL — ABNORMAL HIGH (ref 70–99)
Glucose-Capillary: 110 mg/dL — ABNORMAL HIGH (ref 70–99)
Glucose-Capillary: 118 mg/dL — ABNORMAL HIGH (ref 70–99)
Glucose-Capillary: 118 mg/dL — ABNORMAL HIGH (ref 70–99)
Glucose-Capillary: 135 mg/dL — ABNORMAL HIGH (ref 70–99)
Glucose-Capillary: 137 mg/dL — ABNORMAL HIGH (ref 70–99)
Glucose-Capillary: 189 mg/dL — ABNORMAL HIGH (ref 70–99)
Glucose-Capillary: 371 mg/dL — ABNORMAL HIGH (ref 70–99)
Glucose-Capillary: 408 mg/dL — ABNORMAL HIGH (ref 70–99)
Glucose-Capillary: 420 mg/dL — ABNORMAL HIGH (ref 70–99)
Glucose-Capillary: 448 mg/dL — ABNORMAL HIGH (ref 70–99)
Glucose-Capillary: 449 mg/dL — ABNORMAL HIGH (ref 70–99)
Glucose-Capillary: 464 mg/dL — ABNORMAL HIGH (ref 70–99)
Glucose-Capillary: 483 mg/dL — ABNORMAL HIGH (ref 70–99)

## 2013-08-23 LAB — URIC ACID
Uric Acid, Serum: 0.3 mg/dL — ABNORMAL LOW (ref 2.4–7.0)
Uric Acid, Serum: 0.3 mg/dL — ABNORMAL LOW (ref 2.4–7.0)
Uric Acid, Serum: <0.2 mg/dL — ABNORMAL LOW (ref 2.4–7.0)

## 2013-08-23 LAB — HEMOGLOBIN A1C
Hgb A1c MFr Bld: 12.4 % — ABNORMAL HIGH (ref <5.7)
Mean Plasma Glucose: 309 mg/dL — ABNORMAL HIGH (ref <117)

## 2013-08-23 LAB — OSMOLALITY, URINE: Osmolality, Ur: 520 mOsm/kg (ref 390–1090)

## 2013-08-23 LAB — MAGNESIUM
Magnesium: 2.2 mg/dL (ref 1.5–2.5)
Magnesium: 2.1 mg/dL (ref 1.5–2.5)

## 2013-08-23 LAB — CHLORIDE, URINE, RANDOM: Chloride Urine: 58 mEq/L

## 2013-08-23 LAB — OSMOLALITY: Osmolality: 329 mOsm/kg — ABNORMAL HIGH (ref 275–300)

## 2013-08-23 MED ORDER — KCL IN DEXTROSE-NACL 20-5-0.45 MEQ/L-%-% IV SOLN
INTRAVENOUS | Status: DC
  Administered 2013-08-23 – 2013-08-24 (×4): via INTRAVENOUS

## 2013-08-23 MED ORDER — SODIUM CHLORIDE 0.9 % IV SOLN
INTRAVENOUS | Status: DC
  Administered 2013-08-23: 4 [IU]/h via INTRAVENOUS
  Filled 2013-08-23: qty 1

## 2013-08-23 MED ORDER — PRO-STAT SUGAR FREE PO LIQD
30.0000 mL | Freq: Three times a day (TID) | ORAL | Status: DC
  Administered 2013-08-23 – 2013-08-27 (×3): 30 mL via ORAL
  Filled 2013-08-23 (×4): qty 30

## 2013-08-23 MED ORDER — INSULIN PEN STARTER KIT
1.0000 | Freq: Once | Status: AC
  Administered 2013-08-23: 1
  Filled 2013-08-23: qty 1

## 2013-08-23 MED ORDER — INSULIN ASPART 100 UNIT/ML ~~LOC~~ SOLN
0.0000 [IU] | SUBCUTANEOUS | Status: DC
  Administered 2013-08-23: 3 [IU] via SUBCUTANEOUS

## 2013-08-23 MED ORDER — DEXTROSE 50 % IV SOLN: 25.0000 mL | INTRAVENOUS | Status: DC | PRN

## 2013-08-23 MED ORDER — POTASSIUM PHOSPHATE DIBASIC 3 MMOLE/ML IV SOLN
30.0000 mmol | Freq: Once | INTRAVENOUS | Status: DC
  Filled 2013-08-23: qty 10

## 2013-08-23 MED ORDER — SODIUM CHLORIDE 0.9 % IV SOLN: INTRAVENOUS | Status: DC

## 2013-08-23 MED ORDER — INSULIN REGULAR HUMAN 100 UNIT/ML IJ SOLN
INTRAMUSCULAR | Status: AC
  Filled 2013-08-23: qty 3

## 2013-08-23 MED ORDER — INSULIN REGULAR BOLUS VIA INFUSION
0.0000 [IU] | Freq: Three times a day (TID) | INTRAVENOUS | Status: DC
  Administered 2013-08-24: 10 [IU] via INTRAVENOUS
  Filled 2013-08-23: qty 10

## 2013-08-23 MED ORDER — INSULIN ASPART 100 UNIT/ML ~~LOC~~ SOLN
25.0000 [IU] | Freq: Once | SUBCUTANEOUS | Status: AC
  Administered 2013-08-23: 25 [IU] via SUBCUTANEOUS

## 2013-08-23 MED ORDER — INSULIN ASPART 100 UNIT/ML ~~LOC~~ SOLN
0.0000 [IU] | SUBCUTANEOUS | Status: DC
  Administered 2013-08-23: 20 [IU] via SUBCUTANEOUS

## 2013-08-23 MED ORDER — INSULIN GLARGINE 100 UNIT/ML ~~LOC~~ SOLN
20.0000 [IU] | Freq: Every day | SUBCUTANEOUS | Status: DC
  Administered 2013-08-23: 20 [IU] via SUBCUTANEOUS
  Filled 2013-08-23 (×2): qty 0.2

## 2013-08-23 MED ORDER — POTASSIUM CHLORIDE 10 MEQ/100ML IV SOLN
10.0000 meq | INTRAVENOUS | Status: AC
  Administered 2013-08-23 (×5): 10 meq via INTRAVENOUS

## 2013-08-23 MED ORDER — DEXTROSE-NACL 5-0.45 % IV SOLN: INTRAVENOUS | Status: DC

## 2013-08-23 MED ORDER — LIVING WELL WITH DIABETES BOOK
Freq: Once | Status: AC
  Administered 2013-08-23: 12:00:00
  Filled 2013-08-23: qty 1

## 2013-08-23 MED ORDER — POTASSIUM CHLORIDE 10 MEQ/100ML IV SOLN
INTRAVENOUS | Status: AC
  Administered 2013-08-23: 10 meq via INTRAVENOUS
  Filled 2013-08-23: qty 500

## 2013-08-23 NOTE — Progress Notes (Addendum)
INITIAL NUTRITION ASSESSMENT  DOCUMENTATION CODES Per approved criteria  -Obesity Unspecified   INTERVENTION: 30 ml Prostat TID, which provides 300 kcals and 45 grams protein.  Education on diabetic diet provided- see assessment section for details.   NUTRITION DIAGNOSIS: Inadequate oral food intake related to decreased appetite as evidenced by PO: 0%.   Goal: Pt will meet >90% of estimated energy needs  Monitor:  Diet advancement, PO intake, CBGs, skin integrity, weight changes, changes in status  Reason for Assessment: MD consult for DM diet edu, poor po's  52 y.o. female  Admitting Dx: Hyperglycemia without ketosis  ASSESSMENT: Pt admitted for weakness, hyperglycemia, metabolic acidosis, acute renal failure, and hyperkalemia. She is receiving chemotherapy and steroids for treatment of large cell lymphoma, stage IV with activity in mediastinal lymph nodes and lungs.  Wt hx reveals a 13# (5.8%) wt loss x 3 months, which is not clinically significant. Pt reports weight loss is unintentional, but attributes to poor PO intake for the past week. Intake has been poor this admission (PO: 0%). Noted less than 25% of lunch tray eaten. Pt reports it is easier to drink fluids at this time and reports good intake on a variety of liquids.  Pt reports good appetite prior to admission. Reports home diet of regular with 3-4 meals per day; diet recall reveals a balanced diet of lean meats, fruit, vegetables, and a variety of starches. Pt prepares her own meals and grocery shops for herself. Pt does admit to eating a lot of fruit and large starch portions.  Per diabetes coordinator, pt with new onset steroid induced diabetes.  Pt does not meet criteria for malnutrition at this time, but is at risk for malnutrition given increased nutritional needs due to catabolic illness, weight loss, and poor po intake.   RD consulted for nutrition education regarding diabetes.   Lab Results  Component Value  Date   HGBA1C 12.4* 08/23/2013    RD provided "Carbohydrate Counting for People with Diabetes" handout from the Academy of Nutrition and Dietetics. Discussed different food groups and their effects on blood sugar, emphasizing carbohydrate-containing foods. Provided list of carbohydrates and recommended serving sizes of common foods.  Discussed importance of controlled and consistent carbohydrate intake throughout the day. Provided examples of ways to balance meals/snacks and encouraged intake of high-fiber, whole grain complex carbohydrates. Teach back method used.  Expect fair to good compliance.   Height: Ht Readings from Last 1 Encounters:  08/21/13 5\' 8"  (1.727 m)    Weight: Wt Readings from Last 1 Encounters:  08/23/13 213 lb 3 oz (96.7 kg)    Ideal Body Weight: 140#  % Ideal Body Weight: 152%  Wt Readings from Last 10 Encounters:  08/23/13 213 lb 3 oz (96.7 kg)  08/16/13 216 lb 6.4 oz (98.158 kg)  07/25/13 228 lb 11.2 oz (103.738 kg)  07/04/13 223 lb 3.2 oz (101.243 kg)  06/08/13 216 lb 1.6 oz (98.022 kg)  05/23/13 226 lb 1.6 oz (102.558 kg)  05/23/13 226 lb 1.6 oz (102.558 kg)  04/11/13 225 lb (102.059 kg)  12/12/12 230 lb (104.327 kg)  02/16/12 230 lb (104.327 kg)    Usual Body Weight: 230#  % Usual Body Weight: 93%  BMI:  Body mass index is 32.42 kg/(m^2). Meets criteria for obesity, class I.   Estimated Nutritional Needs: Kcal: 1610-9604 kcals daily Protein: 121-145 grams protein daily Fluid: 2.9-3.4 L fluid daily  Skin: WDL  Diet Order: Full Liquid  EDUCATION NEEDS: -Education needs addressed  Intake/Output Summary (Last 24 hours) at 08/23/13 1517 Last data filed at 08/23/13 1400  Gross per 24 hour  Intake 6557.19 ml  Output   1650 ml  Net 4907.19 ml    Last BM: 08/22/13  Labs:   Recent Labs Lab 08/22/13 2042  08/23/13 0130 08/23/13 0334 08/23/13 0716  NA 150*  < > 146* 146* 145  K 2.6*  < > 3.1* 3.0* 3.6  CL 110  < > 110 109 109   CO2 29  < > 24 28 28   BUN 44*  < > 40* 36* 34*  CREATININE 1.20*  < > 1.14* 1.05 1.05  CALCIUM 9.7  < > 9.8 9.3 9.6  MG 2.2  --   --  2.2 2.1  PHOS 1.9*  --   --  1.9* 2.2*  GLUCOSE 172*  < > 102* 125* 162*  < > = values in this interval not displayed.  CBG (last 3)   Recent Labs  08/23/13 0646 08/23/13 0752 08/23/13 1140  GLUCAP 118* 189* 408*    Scheduled Meds: . antiseptic oral rinse  15 mL Mouth Rinse q12n4p  . chlorhexidine  15 mL Mouth Rinse BID  . insulin aspart  0-20 Units Subcutaneous Q4H  . insulin glargine  20 Units Subcutaneous Daily  . nystatin  5 mL Oral QID  . pantoprazole (PROTONIX) IV  40 mg Intravenous QHS  . sodium chloride  3 mL Intravenous Q12H    Continuous Infusions: . dextrose 5 % and 0.45 % NaCl with KCl 20 mEq/L 125 mL/hr at 08/23/13 1400    Past Medical History  Diagnosis Date  . Hyperlipidemia   . Nerve pain     Left leg  . Seasonal allergies   . Acute bronchitis   . Colon polyps 02/2013    Per colonoscopy  . Diverticulosis 02/2013    Per colonoscopy  . Former light tobacco smoker   . Dysrhythmia   . Cancer     lung and kidney  . Anaplastic large cell lymphoma     Past Surgical History  Procedure Laterality Date  . Partial hysterectomy    . Bones removed from baby toes    . Colonoscopy  02/16/2012    Procedure: COLONOSCOPY;  Surgeon: Corbin Ade, MD;  Location: [facility];  Service: Endoscopy;  Laterality: N/A;  1:30  . Abdominal hysterectomy    . Video bronchoscopy with endobronchial ultrasound N/A 05/13/2013    Procedure: VIDEO BRONCHOSCOPY WITH ENDOBRONCHIAL ULTRASOUND;  Surgeon: Loreli Slot, MD;  Location: [facility] OR;  Service: Thoracic;  Laterality: N/A;  . Mediastinoscopy N/A 05/13/2013    Procedure: MEDIASTINOSCOPY;  Surgeon: Loreli Slot, MD;  Location: [facility] OR;  Service: Thoracic;  Laterality: N/A;   Neera Teng A. Mayford Knife, RD, LDN Pager: [phone]

## 2013-08-23 NOTE — Progress Notes (Signed)
Spoke with Pola Corn RN and MD regarding patients continued increase in CBGs, order to start insulin gtt back per protocol given. Awaiting insulin gtt to start glucostabilizer at this time.

## 2013-08-23 NOTE — Progress Notes (Signed)
Called Dr. Felecia Shelling and made him aware that pt's CBG was 449 and he ordered to give pt 25 units of novolog for this one time.

## 2013-08-23 NOTE — Progress Notes (Signed)
Inpatient Diabetes Program Recommendations  AACE/ADA: New Consensus Statement on Inpatient Glycemic Control (2013)  Target Ranges:  Prepandial:   less than 140 mg/dL      Peak postprandial:   less than 180 mg/dL (1-2 hours)      Critically ill patients:  140 - 180 mg/dL   Results for ALYNN, ELLITHORPE (MRN [id number]) as of 08/23/2013 06:47  Ref. Range 08/22/2013 18:01 08/22/2013 18:52 08/22/2013 19:56 08/22/2013 20:53 08/22/2013 22:01 08/22/2013 22:50 08/22/2013 23:45 08/23/2013 00:48 08/23/2013 01:47 08/23/2013 02:56 08/23/2013 03:41 08/23/2013 04:41 08/23/2013 05:50  Glucose-Capillary Latest Range: 70-99 mg/dL 119 (H) 147 (H) 829 (H) 156 (H) 172 (H) 149 (H) 145 (H) 101 (H) 137 (H) 135 (H) 118 (H) 110 (H) 110 (H)    Inpatient Diabetes Program Recommendations Insulin - Basal: When transitioned to SQ insulin, please consider ordering Lantus 25 units daily.  Basal insulin needs to be given and then nursing should wait 1-2 hours after basal insulin before discontinuing the insulin drip. Correction (SSI): When transitioned to SQ insulin, please consider ordering Novolog sensitive correction Q4H (if NPO) or ACHS (if diet will be resumed). Diet: Please consider ordering Carb Modified Diabetic diet when diet is resumed. IVF:  Please reevaluate IV fluids. Currently patient has D5 @ 250 ml/hr.   Note: Patient is ready to transition to SQ insulin based on blood glucose results and BMET results.  When transitioning to SQ insulin, please consider starting with Lantus 25 units daily for basal insulin, please order Novolog sensitive correction Q4H (if NPO) or ACHS (if diet resumed).  NURSING:  Please follow protocol for transition and be sure to give Lantus than wait 1-2 hours before turning off insulin drip.  At time of drip being turned off, blood glucose needs to be checked and Novolog correction administered per scale.  Will continue to follow.  Thanks, Orlando Penner, RN, MSN, CCRN Diabetes Coordinator Inpatient Diabetes  Program [phone]

## 2013-08-23 NOTE — Progress Notes (Signed)
UR Chart Review Completed  

## 2013-08-23 NOTE — Progress Notes (Signed)
eLink Physician-Brief Progress Note Patient Name: Megan Mcknight DOB: [birth year] MRN: [id number]  Date of Service  08/23/2013   HPI/Events of Note  Hypokalemia earlier now with hypopohosphatemia - recent BMET shows K of 5.7 but labs drawn from port site where K is currently running.  Has received 10 mEq so far of order.  eICU Interventions  Plan: Redraw K peripherally D/C KCL runs Start K Phos 30 mmol IV for phos and potassium replacement   Intervention Category Intermediate Interventions: Electrolyte abnormality - evaluation and management  DETERDING,ELIZABETH 08/23/2013, 1:02 AM

## 2013-08-23 NOTE — Progress Notes (Signed)
Subjective: Patient seen in bed with family members at bedside.   Labs are reviewed with the patient.  Uric acid is back down to WNL following Rasburicase administration.   We spent time discussing DM with the patient.  We briefly touched upon its etiology, pathophysiology, treatment, etc.  She was educated regarding the diagnosis.   She is scheduled to follow-up with Tiana Loft, PA-C (Hem/Onc) on 09/05/2013 and the patient is aware of this upcoming appointment.   Objective: Vital signs in last 24 hours: Temp:  [97.9 F (36.6 C)-99.4 F (37.4 C)] 99.4 F (37.4 C) (09/09 1600) Pulse Rate:  [110-135] 118 (09/09 1800) Resp:  [12-35] 15 (09/09 1800) BP: (97-127)/(45-99) 115/63 mmHg (09/09 1800) SpO2:  [97 %-100 %] 100 % (09/09 1800) FiO2 (%):  [28 %] 28 % (09/08 2004) Weight:  [213 lb 3 oz (96.7 kg)] 213 lb 3 oz (96.7 kg) (09/09 0500)  Intake/Output from previous day: 09/08 0800 - 09/09 0759 In: 1610.9 [P.O.:120; I.V.:5993.2; IV Piggyback:700] Out: 2250 [Urine:2250] Intake/Output this shift: Total I/O In: 2601.9 [P.O.:840; I.V.:1761.9] Out: 1500 [Urine:1500]  General appearance: alert, no distress and mildly obese Resp: clear to auscultation bilaterally Cardio: regular rate and rhythm, S1, S2 normal, no murmur, click, rub or gallop GI: soft, non-tender; bowel sounds normal; no masses,  no organomegaly Extremities: extremities normal, atraumatic, no cyanosis or edema Oropharynx: Improving oral candidiasis.  Lab Results:   Recent Labs  08/22/13 1004 08/23/13 0716  WBC 0.8* 0.6*  HGB 11.5* 10.9*  HCT 37.1 34.4*  PLT 76* 39*   BMET  Recent Labs  08/23/13 0334 08/23/13 0716  NA 146* 145  K 3.0* 3.6  CL 109 109  CO2 28 28  GLUCOSE 125* 162*  BUN 36* 34*  CREATININE 1.05 1.05  CALCIUM 9.3 9.6    Studies/Results: No results found.  Medications: I have reviewed the patient's current medications.  Assessment: 1. Anaplastic large cell lymphoma, ALK+ and CD30  +. S/P 5/6 cycles of CHOEP.  She is D8 of cycle 5 with Neulasta support on D4. 2. Hyperuricemia at time of admission with uric acid at 18.0.  Rasburicase 7.5 mg given IV on 08/22/2013 with excellent response.  3. Diabetes, presenting with a glucose of 1763 at time of ED presentation. Hgb A1C is 12.4.  Attention given to this when patient is due for next cycle of chemotherapy. 4. Electrolyte abnormalities associated to #3.  5. Acute renal injury, improving.  Followed by nephrology. 6. Oral candidiasis, on Nystatin 500,000 U QID   PLAN: 1. Continue Nystatin swish and swallow 2. Continue with current medical management. 3. At time of discharge, follow-up with primary oncology team as scheduled.  4. Will sign off on this patient.  We would be happy to see the patient in the future and participate in her care if needed. Thank you.  Patient and plan discussed with Dr. Erline Hau and he is in agreement with the aforementioned.     LOS: 2 days    Megan Mcknight 08/23/2013

## 2013-08-23 NOTE — Progress Notes (Signed)
Subjective: Patient is alert and awake today. She is responding to verbal communications appropriately. She is much better. Objective: Vital signs in last 24 hours: Temp:  [97.9 F (36.6 C)-98.5 F (36.9 C)] 97.9 F (36.6 C) (09/09 0400) Pulse Rate:  [110-135] 117 (09/09 0600) Resp:  [14-21] 17 (09/09 0600) BP: (98-143)/(57-107) 100/75 mmHg (09/09 0600) SpO2:  [97 %-100 %] 100 % (09/09 0809) FiO2 (%):  [28 %] 28 % (09/08 2004) Weight:  [96.7 kg (213 lb 3 oz)] 96.7 kg (213 lb 3 oz) (09/09 0500) Weight change: 3.9 kg (8 lb 9.6 oz) Last BM Date: 08/22/13  Intake/Output from previous day: 09/08 0701 - 09/09 0700 In: 1610.9 [P.O.:120; I.V.:5993.2; IV Piggyback:700] Out: 2250 [Urine:2250]  PHYSICAL EXAM General appearance: alert, no distress and slowed mentation Resp: clear to auscultation bilaterally Cardio: S1, S2 normal GI: soft, non-tender; bowel sounds normal; no masses,  no organomegaly Extremities: extremities normal, atraumatic, no cyanosis or edema  Lab Results:    @labtest @ [facility]  Recent Labs  08/21/13 1405  PHART 7.222*  PO2ART 136.0*  TCO2 13.3  HCO3 14.0*   CULTURES Recent Results (from the past 240 hour(s))  CULTURE, BLOOD (ROUTINE X 2)     Status: None   Collection Time    08/21/13 11:20 AM      Result Value Range Status   Specimen Description BLOOD PORT   Final   Special Requests     Final   Value: BOTTLES DRAWN AEROBIC AND ANAEROBIC AEB 10CC ANA 6CC   Culture NO GROWTH 1 DAY   Final   Report Status PENDING   Incomplete  CULTURE, BLOOD (ROUTINE X 2)     Status: None   Collection Time    08/21/13 11:38 AM      Result Value Range Status   Specimen Description BLOOD RIGHT ANTECUBITAL   Final   Special Requests BOTTLES DRAWN AEROBIC AND ANAEROBIC 14CC EACH   Final   Culture NO GROWTH 1 DAY   Final   Report Status PENDING   Incomplete  MRSA PCR SCREENING     Status: None   Collection Time    08/21/13  3:30 PM      Result Value Range Status   MRSA  by PCR NEGATIVE  NEGATIVE Final   Comment:            The GeneXpert MRSA Assay (FDA     approved for NASAL specimens     only), is one component of a     comprehensive MRSA colonization     surveillance program. It is not     intended to diagnose MRSA     infection nor to guide or     monitor treatment for     MRSA infections.   Studies/Results: Ct Head Wo Contrast  08/21/2013   *RADIOLOGY REPORT*  Clinical Data: Altered mental status.  History of lung and kidney cancer.  CT HEAD WITHOUT CONTRAST  Technique:  Contiguous axial images were obtained from the base of the skull through the vertex without contrast.  Comparison: No priors.  Findings: No acute intracranial abnormalities.  Specifically, no evidence of acute intracranial hemorrhage, no definite findings of acute/subacute cerebral ischemia, no mass, mass effect, hydrocephalus or abnormal intra or extra-axial fluid collections. Visualized paranasal sinuses and mastoids are well pneumatized.  No acute displaced skull fractures are identified.  IMPRESSION: 1.  No acute intracranial abnormalities. 2.  The appearance of the brain is normal.   Original Report Authenticated By:  Trudie Reed, M.D.   Dg Chest Portable 1 View  08/21/2013   *RADIOLOGY REPORT*  Clinical Data: Altered mental status.  PORTABLE CHEST - 1 VIEW  Comparison: 05/20/2013  Findings: Right IJ Port-A-Cath has tip overlying the cavoatrial junction.  Lungs are hypoinflated without focal consolidation or effusion.  Cardiomediastinal silhouette and remainder of the exam is unchanged.  IMPRESSION: Hypoinflation without acute cardiopulmonary disease.  Right IJ Port-A-Cath with tip over the cavoatrial junction.   Original Report Authenticated By: Elberta Fortis, M.D.    Medications: I have reviewed the patient's current medications.  Assesment: Principal Problem:   Hyperglycemia without ketosis Active Problems:   Other malignant lymphomas, unspecified site, extranodal and solid  organ sites   Tumor lysis syndrome   Acute renal failure   Acute encephalopathy   Hyperkalemia severe leucopenia    Plan: Will continue insulin therapy and iv hydration Nephrology consult appreciated Will CBC with diff Oncology consult appreciated Will do HGA1C Will start on clear liquid diet.    LOS: 2 days   Paislea Hatton 08/23/2013, 8:22 AM

## 2013-08-23 NOTE — Progress Notes (Signed)
Spoke with pt about new diabetes diagnosis. Discussed A1C results (12.4%) with her and explained what an A1C is, basic pathophysiology of DM Type 2, basic home care, importance of checking CBGs and maintaining good CBG control to prevent long-term and short-term complications. Reviewed signs and symptoms of hyperglycemia and hypoglycemia along with proper treatment for both.  Have asked that patient check blood sugar at least 4 times a day and keep a record of blood glucose results to take to follow up visits with the doctor.  Discussed impact of diet, stress, medications (steroids), and exercise on diabetes control.  Provided patient with [facility] toolkit and reviewed booklets with patient.  Educated on types of insulins and function of insulin.  Instructed on insulin pen injections and demonstrated proper technique for injections via insulin pen.  Patient verbalized proper technique and was able to demonstrate proper technique for injection via insulin pen.  Provided a list of patient education videos on diabetes and have patient to watch videos today.  Ordered consult with dietician for diet education and informed patient about free outpatient diabetes education class taught here at [facility] and encouraged patient to attend one of the classes.  Also informed patient about local endocrinologist Dr. Fransico Him and provided her with his business card.  RNs to provide ongoing basic DM education at bedside with this patient. Have ordered educational booklet, insulin starter kit, and DM videos.  Patient verbalized understanding of information discussed and states she has no further questions at this time.  Informed patient I will follow up with her tomorrow to review information again and clarify any questions that she may have.  At time of discharge, patient will need prescription for a glucometer (paper prescription form in paper chart for MD to sign) and testing supplies, insulin pen(s), and insulin needle  tips.  Will continue to follow as an inpatient.  Thanks, Orlando Penner, RN, MSN, CCRN Diabetes Coordinator Inpatient Diabetes Program [phone]

## 2013-08-23 NOTE — Progress Notes (Signed)
Subjective: Interval History: has complaints dry mouth. She has also some nausea and the unable to eat or drink. Patient denies any difficulty increasing..  Objective: Vital signs in last 24 hours: Temp:  [97.9 F (36.6 C)-98.9 F (37.2 C)] 97.9 F (36.6 C) (09/09 0400) Pulse Rate:  [110-135] 117 (09/09 0600) Resp:  [14-22] 17 (09/09 0600) BP: (98-143)/(57-107) 100/75 mmHg (09/09 0600) SpO2:  [97 %-100 %] 100 % (09/09 0600) FiO2 (%):  [28 %] 28 % (09/08 2004) Weight:  [96.7 kg (213 lb 3 oz)] 96.7 kg (213 lb 3 oz) (09/09 0500) Weight change: 3.9 kg (8 lb 9.6 oz)  Intake/Output from previous day: 09/08 0701 - 09/09 0700 In: 1610.9 [P.O.:120; I.V.:5993.2; IV Piggyback:700] Out: 2250 [Urine:2250] Intake/Output this shift:    Generally she is alert and in no apparent distress. Chest is clear to auscultation no rales rhonchi or egophony Heart exam regular rate and rhythm no murmur S3 Abdomen is soft nontender positive bowel sound. Extremities no edema. Lab Results:  Recent Labs  08/22/13 0545 08/22/13 1004  WBC 1.1* 0.8*  HGB 11.6* 11.5*  HCT 37.3 37.1  PLT 90* 76*   BMET:  Recent Labs  08/23/13 0130 08/23/13 0334  NA 146* 146*  K 3.1* 3.0*  CL 110 109  CO2 24 28  GLUCOSE 102* 125*  BUN 40* 36*  CREATININE 1.14* 1.05  CALCIUM 9.8 9.3   No results found for this basename: PTH,  in the last 72 hours Iron Studies: No results found for this basename: IRON, TIBC, TRANSFERRIN, FERRITIN,  in the last 72 hours  Studies/Results: Ct Head Wo Contrast  08/21/2013   *RADIOLOGY REPORT*  Clinical Data: Altered mental status.  History of lung and kidney cancer.  CT HEAD WITHOUT CONTRAST  Technique:  Contiguous axial images were obtained from the base of the skull through the vertex without contrast.  Comparison: No priors.  Findings: No acute intracranial abnormalities.  Specifically, no evidence of acute intracranial hemorrhage, no definite findings of acute/subacute cerebral  ischemia, no mass, mass effect, hydrocephalus or abnormal intra or extra-axial fluid collections. Visualized paranasal sinuses and mastoids are well pneumatized.  No acute displaced skull fractures are identified.  IMPRESSION: 1.  No acute intracranial abnormalities. 2.  The appearance of the brain is normal.   Original Report Authenticated By: Trudie Reed, M.D.   Dg Chest Portable 1 View  08/21/2013   *RADIOLOGY REPORT*  Clinical Data: Altered mental status.  PORTABLE CHEST - 1 VIEW  Comparison: 05/20/2013  Findings: Right IJ Port-A-Cath has tip overlying the cavoatrial junction.  Lungs are hypoinflated without focal consolidation or effusion.  Cardiomediastinal silhouette and remainder of the exam is unchanged.  IMPRESSION: Hypoinflation without acute cardiopulmonary disease.  Right IJ Port-A-Cath with tip over the cavoatrial junction.   Original Report Authenticated By: Elberta Fortis, M.D.    I have reviewed the patient's current medications.  Assessment/Plan: Problem #1 her renal failure her BUN and creatinine has improved and her renal function has come to baseline. Most likely secondary to prerenal syndrome. Problem #2 hypokalemia her potassium is low. Patient was admitted with hyperkalemia. This could be secondary to resolution of her hyperglycemia and also diuretics. Problem #3 hypernatremia sodium has improved. Problem #4 altered mental status patient has this moment is more awake and alert. Problem #5 history of lymphoma  Problem #6 hyperglycemia her glucose has normalized. Problem #7 history of a trial fibrillation Problem #8 history of GERD.  Plan: 1]We'll change her IV fluid to  D5 half-normal saline with 20 mEq of KCl at 125 cc per hour           2]DC Lasix           3]DC allopurinol          4]We'll check her basic metabolic panel in the morning.   LOS: 2 days   Chloie Loney S 08/23/2013,7:18 AM

## 2013-08-23 NOTE — Progress Notes (Signed)
We saw patient together and I agree with the above.  She was clinically stable and uric acid and renal function improved.  We'll leave the rest of the medical management of the description of the primary team and recommend that patient follow up with oncologist and primary physician and discharge.  Will be available if need be otherwise do not plan to continue follow patient . Thank you for the opportunity to be part of her care.

## 2013-08-23 NOTE — Progress Notes (Signed)
MD ordered K Phos , but AC unable to mix K fluids; womens rx and on call rx called and verified unable to mix; MD notified that unable to obtain medication; awaiting labs at this time.

## 2013-08-23 NOTE — Progress Notes (Signed)
CRITICAL VALUE ALERT  Critical value received:  wbc  Date of notification: 08-23-2013  Time of notification: 0800  Critical value read back:yes  Nurse who received alert:  Trevor Iha, RN  MD notified (1st page):  Dr. Felecia Shelling   Time of first page:  0805  MD notified (2nd page):  Time of second page:  Responding MD:  Dr. Felecia Shelling  Time MD responded:  [phone]

## 2013-08-23 NOTE — Progress Notes (Signed)
CRITICAL VALUE ALERT  Critical value received:  Platelets  Date of notification:  08-23-2013  Time of notification:  0800  Critical value read back:yes  Nurse who received alert:  Trevor Iha, RN  MD notified (1st page):  Dr. Felecia Shelling  Time of first page:  0805  MD notified (2nd page):  Time of second page:  Responding MD:  Dr. Felecia Shelling  Time MD responded: [phone]

## 2013-08-24 LAB — GLUCOSE, CAPILLARY
Glucose-Capillary: 125 mg/dL — ABNORMAL HIGH (ref 70–99)
Glucose-Capillary: 136 mg/dL — ABNORMAL HIGH (ref 70–99)
Glucose-Capillary: 151 mg/dL — ABNORMAL HIGH (ref 70–99)
Glucose-Capillary: 152 mg/dL — ABNORMAL HIGH (ref 70–99)
Glucose-Capillary: 161 mg/dL — ABNORMAL HIGH (ref 70–99)
Glucose-Capillary: 165 mg/dL — ABNORMAL HIGH (ref 70–99)
Glucose-Capillary: 169 mg/dL — ABNORMAL HIGH (ref 70–99)
Glucose-Capillary: 182 mg/dL — ABNORMAL HIGH (ref 70–99)
Glucose-Capillary: 213 mg/dL — ABNORMAL HIGH (ref 70–99)
Glucose-Capillary: 219 mg/dL — ABNORMAL HIGH (ref 70–99)
Glucose-Capillary: 224 mg/dL — ABNORMAL HIGH (ref 70–99)
Glucose-Capillary: 232 mg/dL — ABNORMAL HIGH (ref 70–99)

## 2013-08-24 LAB — CBC WITH DIFFERENTIAL/PLATELET
Basophils Absolute: 0 10*3/uL (ref 0.0–0.1)
Basophils Relative: 0 % (ref 0–1)
Eosinophils Absolute: 0 10*3/uL (ref 0.0–0.7)
Eosinophils Relative: 2 % (ref 0–5)
HCT: 29.7 % — ABNORMAL LOW (ref 36.0–46.0)
Hemoglobin: 9.4 g/dL — ABNORMAL LOW (ref 12.0–15.0)
Lymphocytes Relative: 91 % — ABNORMAL HIGH (ref 12–46)
Lymphs Abs: 0.5 10*3/uL — ABNORMAL LOW (ref 0.7–4.0)
MCH: 27.9 pg (ref 26.0–34.0)
MCHC: 31.6 g/dL (ref 30.0–36.0)
MCV: 88.1 fL (ref 78.0–100.0)
Monocytes Absolute: 0 10*3/uL — ABNORMAL LOW (ref 0.1–1.0)
Monocytes Relative: 4 % (ref 3–12)
Neutro Abs: 0 10*3/uL — ABNORMAL LOW (ref 1.7–7.7)
Neutrophils Relative %: 4 % — ABNORMAL LOW (ref 43–77)
Platelets: 22 10*3/uL — CL (ref 150–400)
RBC: 3.37 MIL/uL — ABNORMAL LOW (ref 3.87–5.11)
RDW: 16 % — ABNORMAL HIGH (ref 11.5–15.5)
WBC: 0.6 10*3/uL — CL (ref 4.0–10.5)

## 2013-08-24 LAB — BASIC METABOLIC PANEL
BUN: 20 mg/dL (ref 6–23)
CO2: 29 mEq/L (ref 19–32)
Calcium: 9.4 mg/dL (ref 8.4–10.5)
Chloride: 118 mEq/L — ABNORMAL HIGH (ref 96–112)
Creatinine, Ser: 0.7 mg/dL (ref 0.50–1.10)
GFR calc Af Amer: >90 mL/min (ref >90)
GFR calc non Af Amer: >90 mL/min (ref >90)
Glucose, Bld: 160 mg/dL — ABNORMAL HIGH (ref 70–99)
Potassium: 3.6 mEq/L (ref 3.5–5.1)
Sodium: 152 mEq/L — ABNORMAL HIGH (ref 135–145)

## 2013-08-24 LAB — PHOSPHORUS: Phosphorus: 2.3 mg/dL (ref 2.3–4.6)

## 2013-08-24 LAB — MAGNESIUM: Magnesium: 2.2 mg/dL (ref 1.5–2.5)

## 2013-08-24 LAB — URIC ACID: Uric Acid, Serum: 1.3 mg/dL — ABNORMAL LOW (ref 2.4–7.0)

## 2013-08-24 MED ORDER — ALUM & MAG HYDROXIDE-SIMETH 200-200-20 MG/5ML PO SUSP
30.0000 mL | ORAL | Status: DC | PRN
  Administered 2013-08-24 – 2013-08-25 (×2): 30 mL via ORAL
  Filled 2013-08-24 (×2): qty 30

## 2013-08-24 MED ORDER — INSULIN ASPART 100 UNIT/ML ~~LOC~~ SOLN
0.0000 [IU] | Freq: Every day | SUBCUTANEOUS | Status: DC
  Administered 2013-08-25: 2 [IU] via SUBCUTANEOUS

## 2013-08-24 MED ORDER — INSULIN GLARGINE 100 UNIT/ML ~~LOC~~ SOLN
40.0000 [IU] | Freq: Every day | SUBCUTANEOUS | Status: DC
  Administered 2013-08-24 – 2013-08-29 (×6): 40 [IU] via SUBCUTANEOUS
  Filled 2013-08-24 (×6): qty 0.4

## 2013-08-24 MED ORDER — LANSOPRAZOLE 15 MG PO CPDR
15.0000 mg | DELAYED_RELEASE_CAPSULE | Freq: Every day | ORAL | Status: DC
  Administered 2013-08-24 – 2013-08-29 (×6): 15 mg via ORAL
  Filled 2013-08-24 (×3): qty 1

## 2013-08-24 MED ORDER — INSULIN ASPART 100 UNIT/ML ~~LOC~~ SOLN
0.0000 [IU] | Freq: Three times a day (TID) | SUBCUTANEOUS | Status: DC
  Administered 2013-08-24: 7 [IU] via SUBCUTANEOUS
  Administered 2013-08-24 – 2013-08-27 (×8): 4 [IU] via SUBCUTANEOUS
  Administered 2013-08-27: 7 [IU] via SUBCUTANEOUS
  Administered 2013-08-28 (×2): 4 [IU] via SUBCUTANEOUS
  Administered 2013-08-28: 3 [IU] via SUBCUTANEOUS
  Administered 2013-08-29: 4 [IU] via SUBCUTANEOUS

## 2013-08-24 NOTE — Progress Notes (Signed)
The patient is receiving Protonix by the intravenous route.  Based on criteria approved by the Pharmacy and Therapeutics Committee and the Medical Executive Committee, the medication is being converted to the equivalent oral dose form.  These criteria include: -No Active GI bleeding -Able to tolerate diet of full liquids (or better) or tube feeding OR able to tolerate other medications by the oral or enteral route  **Changing to oral Prevacid per patient request.  She states this works better for her than Protonix.   If you have any questions about this conversion, please contact the Pharmacy Department (ext [phone]).  Thank you.  Elson Clan, [facility] 08/24/2013 11:19 AM

## 2013-08-24 NOTE — Progress Notes (Signed)
Pt complaining of heart burn. Pt already received Prevacid this am. Dr Felecia Shelling paged and made aware. Order received for Maalox.

## 2013-08-24 NOTE — Progress Notes (Signed)
Inpatient Diabetes Program Recommendations  AACE/ADA: New Consensus Statement on Inpatient Glycemic Control (2013)  Target Ranges:  Prepandial:   less than 140 mg/dL      Peak postprandial:   less than 180 mg/dL (1-2 hours)      Critically ill patients:  140 - 180 mg/dL   Results for Megan Mcknight, Megan Mcknight (MRN [id number]) as of 08/24/2013 07:14  Ref. Range 08/23/2013 07:52 08/23/2013 11:40 08/23/2013 16:32 08/23/2013 19:44 08/23/2013 20:46 08/23/2013 21:47 08/23/2013 22:43 08/23/2013 23:43 08/24/2013 00:46 08/24/2013 02:20 08/24/2013 03:28 08/24/2013 04:29 08/24/2013 06:05 08/24/2013 07:00  Glucose-Capillary Latest Range: 70-99 mg/dL 696 (H) 295 (H) 284 (H) 448 (H) 464 (H) 483 (H) 420 (H) 371 (H) 219 (H) 169 (H) 151 (H) 136 (H) 125 (H) 152 (H)   Inpatient Diabetes Program Recommendations Insulin - Basal: Please order Lantus 40 units daily (give Lantus and continue insulin drip for 2 hours). Correction (SSI): Please order Novolog correction Q4H with Resistant scale.   Note: Patient was transitioned from IV insulin to SQ insulin yesterday.  However, basal insulin was not ordered or given prior to discontinuation of insulin drip as per protocol and patient experienced hyperglycemia with blood glucose greater than 400 mg/dl within a few hours of insulin drip being discontinued.  Patient had to be restarted on insulin drip last night. Over the past 10 hours patient has received Novolog 61.9 units per Glucostabilizer for glucose control.  Please follow Glucostabilizer protocol which recommends basal insulin to be given 1-2 hours prior to discontinuation of insulin drip.  After 1-2 hours of basal insulin being given, blood sugar is checked and Novolog correction insulin is given.  Please order Lantus 40 units daily and Novolog resistant correction Q4H.  Also, recommend consulting Dr. Fransico Him for diabetes management.  Will continue to follow.  Thanks, Orlando Penner, RN, MSN, CCRN Diabetes Coordinator Inpatient Diabetes  Program [phone]'

## 2013-08-24 NOTE — Progress Notes (Signed)
Megan Mcknight  MRN: [id number]  DOB/AGE: 09/11/1961 52 y.o.  Primary Care Physician:[name],[name], MD  Admit date: 08/21/2013  Chief Complaint:  Chief Complaint  Patient presents with  . Altered Mental Status    S-Pt presented on  08/21/2013 with  Chief Complaint  Patient presents with  . Altered Mental Status  .    Pt today feels better.  Meds  . antiseptic oral rinse  15 mL Mouth Rinse q12n4p  . chlorhexidine  15 mL Mouth Rinse BID  . feeding supplement  30 mL Oral TID WC  . insulin aspart  0-20 Units Subcutaneous TID WC  . insulin aspart  0-5 Units Subcutaneous QHS  . insulin glargine  40 Units Subcutaneous Daily  . insulin regular  0-10 Units Intravenous TID WC  . nystatin  5 mL Oral QID  . pantoprazole (PROTONIX) IV  40 mg Intravenous QHS  . sodium chloride  3 mL Intravenous Q12H      Physical Exam: Vital signs in last 24 hours: Temp:  [98.2 F (36.8 C)-99.6 F (37.6 C)] 98.8 F (37.1 C) (09/10 0800) Pulse Rate:  [103-129] 105 (09/10 0800) Resp:  [12-35] 25 (09/10 0800) BP: (97-132)/(45-87) 117/65 mmHg (09/10 0600) SpO2:  [99 %-100 %] 100 % (09/10 0800) Weight:  [215 lb 13.3 oz (97.9 kg)] 215 lb 13.3 oz (97.9 kg) (09/10 0500) Weight change: 2 lb 10.3 oz (1.2 kg) Last BM Date: 08/22/13  Intake/Output from previous day: 09/09 0701 - 09/10 0700 In: 4162.8 [P.O.:840; I.V.:3322.8] Out: 1500 [Urine:1500]     Physical Exam: General- pt is awake,alert, . Resp- No acute REsp distress, CTA B/L NO Rhonchi CVS- S1S2 regular in rate and rhythm GIT- BS+, soft, NT, ND EXT- NO LE Edema, Cyanosis   Lab Results: CBC  Recent Labs  08/23/13 0716 08/24/13 0815  WBC 0.6* 0.6*  HGB 10.9* 9.4*  HCT 34.4* 29.7*  PLT 39* 22*    BMET  Recent Labs  08/23/13 0716 08/24/13 0445  NA 145 152*  K 3.6 3.6  CL 109 118*  CO2 28 29  GLUCOSE 162* 160*  BUN 34* 20  CREATININE 1.05 0.70  CALCIUM 9.6 9.4    MICRO Recent Results (from the past 240 hour(s))   CULTURE, BLOOD (ROUTINE X 2)     Status: None   Collection Time    08/21/13 11:20 AM      Result Value Range Status   Specimen Description BLOOD PORT   Final   Special Requests     Final   Value: BOTTLES DRAWN AEROBIC AND ANAEROBIC AEB 10CC ANA 6CC   Culture NO GROWTH 2 DAYS   Final   Report Status PENDING   Incomplete  CULTURE, BLOOD (ROUTINE X 2)     Status: None   Collection Time    08/21/13 11:38 AM      Result Value Range Status   Specimen Description BLOOD RIGHT ANTECUBITAL   Final   Special Requests BOTTLES DRAWN AEROBIC AND ANAEROBIC 14CC EACH   Final   Culture NO GROWTH 2 DAYS   Final   Report Status PENDING   Incomplete  MRSA PCR SCREENING     Status: None   Collection Time    08/21/13  3:30 PM      Result Value Range Status   MRSA by PCR NEGATIVE  NEGATIVE Final   Comment:            The GeneXpert MRSA Assay (FDA     approved  for NASAL specimens     only), is one component of a     comprehensive MRSA colonization     surveillance program. It is not     intended to diagnose MRSA     infection nor to guide or     monitor treatment for     MRSA infections.      Lab Results  Component Value Date   CALCIUM 9.4 08/24/2013   PHOS 2.3 08/24/2013            Impression: 1)Renal  AKI secondary to Prerenal AKI now much better Creat now at baseline                2)DM Admitted with Hyperglycemia Now better  3)Anemia HGb at goal (9--11)   4)Oncology Admitted with Tumor lysis Uric acid now better   5)Hypernatremia   6)Hypokalemia Now better  7)Acid base Co2 at goal     Plan:  Encouraged pt to drink more water If na still high in am Will start 1/2 NS       BHUTANI,MANPREET S 08/24/2013, 10:06 AM

## 2013-08-24 NOTE — Progress Notes (Signed)
RN and NT worked with patient to perform own finger sticks and obtain own blood sugar tonight, no insulin was needed but RN taught patient how to pull up insulin and measure it correctly. Insulin Pen box in room, unknown what kind of insulin patient will be going home with, informed patient to check with diabetes coordinator to see if insulin pen was available for teaching, unable to find one on floor. Patient watched all the diabetes videos outlined for her on handout 08-24-13.

## 2013-08-24 NOTE — Progress Notes (Signed)
Subjective: Patient feels better today. She is able to tolerate liquit diet. No fever or chills. Objective: Vital signs in last 24 hours: Temp:  [98.2 F (36.8 C)-99.6 F (37.6 C)] 98.8 F (37.1 C) (09/10 0400) Pulse Rate:  [103-129] 103 (09/10 0400) Resp:  [12-35] 18 (09/10 0600) BP: (97-132)/(45-87) 117/65 mmHg (09/10 0600) SpO2:  [99 %-100 %] 100 % (09/10 0400) Weight:  [97.9 kg (215 lb 13.3 oz)] 97.9 kg (215 lb 13.3 oz) (09/10 0500) Weight change: 1.2 kg (2 lb 10.3 oz) Last BM Date: 08/22/13  Intake/Output from previous day: 09/09 0701 - 09/10 0700 In: 4162.8 [P.O.:840; I.V.:3322.8] Out: 1500 [Urine:1500]  PHYSICAL EXAM General appearance: alert, no distress and slowed mentation Resp: clear to auscultation bilaterally Cardio: S1, S2 normal GI: soft, non-tender; bowel sounds normal; no masses,  no organomegaly Extremities: extremities normal, atraumatic, no cyanosis or edema  Lab Results:    @labtest @ [facility]  Recent Labs  08/21/13 1405  PHART 7.222*  PO2ART 136.0*  TCO2 13.3  HCO3 14.0*   CULTURES Recent Results (from the past 240 hour(s))  CULTURE, BLOOD (ROUTINE X 2)     Status: None   Collection Time    08/21/13 11:20 AM      Result Value Range Status   Specimen Description BLOOD PORT   Final   Special Requests     Final   Value: BOTTLES DRAWN AEROBIC AND ANAEROBIC AEB 10CC ANA 6CC   Culture NO GROWTH 2 DAYS   Final   Report Status PENDING   Incomplete  CULTURE, BLOOD (ROUTINE X 2)     Status: None   Collection Time    08/21/13 11:38 AM      Result Value Range Status   Specimen Description BLOOD RIGHT ANTECUBITAL   Final   Special Requests BOTTLES DRAWN AEROBIC AND ANAEROBIC 14CC EACH   Final   Culture NO GROWTH 2 DAYS   Final   Report Status PENDING   Incomplete  MRSA PCR SCREENING     Status: None   Collection Time    08/21/13  3:30 PM      Result Value Range Status   MRSA by PCR NEGATIVE  NEGATIVE Final   Comment:            The GeneXpert  MRSA Assay (FDA     approved for NASAL specimens     only), is one component of a     comprehensive MRSA colonization     surveillance program. It is not     intended to diagnose MRSA     infection nor to guide or     monitor treatment for     MRSA infections.   Studies/Results: No results found.  Medications: I have reviewed the patient's current medications.  Assesment: Principal Problem:   Hyperglycemia without ketosis Active Problems:   Other malignant lymphomas, unspecified site, extranodal and solid organ sites   Tumor lysis syndrome   Acute renal failure   Acute encephalopathy   Hyperkalemia severe leucopenia    Plan: Will continue iv hydration Increase lantus to 40 units Will monitor CBC and BMP.    LOS: 3 days   Megan Mcknight 08/24/2013, 8:13 AM

## 2013-08-25 LAB — MAGNESIUM: Magnesium: 1.7 mg/dL (ref 1.5–2.5)

## 2013-08-25 LAB — CBC
HCT: 28.4 % — ABNORMAL LOW (ref 36.0–46.0)
Hemoglobin: 9.1 g/dL — ABNORMAL LOW (ref 12.0–15.0)
MCH: 28 pg (ref 26.0–34.0)
MCHC: 32 g/dL (ref 30.0–36.0)
MCV: 87.4 fL (ref 78.0–100.0)
Platelets: 22 10*3/uL — CL (ref 150–400)
RBC: 3.25 MIL/uL — ABNORMAL LOW (ref 3.87–5.11)
RDW: 15.8 % — ABNORMAL HIGH (ref 11.5–15.5)
WBC: 0.9 10*3/uL — CL (ref 4.0–10.5)

## 2013-08-25 LAB — GLUCOSE, CAPILLARY
Glucose-Capillary: 142 mg/dL — ABNORMAL HIGH (ref 70–99)
Glucose-Capillary: 151 mg/dL — ABNORMAL HIGH (ref 70–99)
Glucose-Capillary: 245 mg/dL — ABNORMAL HIGH (ref 70–99)
Glucose-Capillary: 199 mg/dL — ABNORMAL HIGH (ref 70–99)
Glucose-Capillary: 190 mg/dL — ABNORMAL HIGH (ref 70–99)
Glucose-Capillary: 222 mg/dL — ABNORMAL HIGH (ref 70–99)

## 2013-08-25 LAB — PHOSPHORUS: Phosphorus: 2.1 mg/dL — ABNORMAL LOW (ref 2.3–4.6)

## 2013-08-25 LAB — URIC ACID: Uric Acid, Serum: 2.2 mg/dL — ABNORMAL LOW (ref 2.4–7.0)

## 2013-08-25 MED ORDER — DEXTROSE 5 % IV SOLN
INTRAVENOUS | Status: DC
  Administered 2013-08-25 – 2013-08-29 (×5): via INTRAVENOUS

## 2013-08-25 NOTE — Progress Notes (Signed)
UR Chart Review Completed  

## 2013-08-25 NOTE — Progress Notes (Signed)
Pt came up to floor from ICU. Pt is in NAD will continue to monitor.

## 2013-08-25 NOTE — Progress Notes (Signed)
Report called to RN. Pt to be transferred to room 330 per MD order. Pt transferred via wheelchair with personal belongings.

## 2013-08-25 NOTE — Progress Notes (Signed)
Subjective: Interval History: has complaints dry mouth. She has also some nausea but no vomiting. Patient denies any difficulty in breathing. She has soar mouth  Objective: Vital signs in last 24 hours: Temp:  [98.1 F (36.7 C)-99.5 F (37.5 C)] 98.4 F (36.9 C) (09/11 0400) Pulse Rate:  [105-112] 112 (09/10 0900) Resp:  [13-26] 14 (09/11 0600) BP: (91-138)/(34-88) 101/69 mmHg (09/11 0600) SpO2:  [100 %] 100 % (09/10 0900) Weight:  [95.6 kg (210 lb 12.2 oz)] 95.6 kg (210 lb 12.2 oz) (09/11 0500) Weight change: -2.3 kg (-5 lb 1.1 oz)  Intake/Output from previous day: 09/10 0701 - 09/11 0700 In: 1212.3 [P.O.:1190; I.V.:22.3] Out: 1400 [Urine:1400] Intake/Output this shift:    Generally she is alert and in no apparent distress. Chest is clear to auscultation no rales rhonchi or egophony Heart exam regular rate and rhythm no murmur S3 Abdomen is soft nontender positive bowel sound. Extremities no edema. Lab Results:  Recent Labs  08/24/13 0815 08/25/13 0442  WBC 0.6* 0.9*  HGB 9.4* 9.1*  HCT 29.7* 28.4*  PLT 22* 22*   BMET:   Recent Labs  08/23/13 0716 08/24/13 0445  NA 145 152*  K 3.6 3.6  CL 109 118*  CO2 28 29  GLUCOSE 162* 160*  BUN 34* 20  CREATININE 1.05 0.70  CALCIUM 9.6 9.4   No results found for this basename: PTH,  in the last 72 hours Iron Studies: No results found for this basename: IRON, TIBC, TRANSFERRIN, FERRITIN,  in the last 72 hours  Studies/Results: No results found.  I have reviewed the patient's current medications.  Assessment/Plan: Problem #1 her renal failure her BUN and creatinine has coreected Problem #2 hypokalemia her potassium is has normalized. Problem #3 hypernatremia sodium has improved.  Problem #4 altered mental status patient has this moment is more awake and alert. Problem #5 history of lymphoma  Problem #6 hyperglycemia her glucose has normalized. Problem #7 history of a trial fibrillation Problem #8 history of  GERD.  Plan: 1]Encourage po free water intake           2] D5W at 50 cc/hr until her free water intake is good           3] since her renal function has recovered I will sign off. Thank you.   LOS: 4 days   Mazey Mantell S 08/25/2013,7:45 AM

## 2013-08-25 NOTE — Progress Notes (Signed)
Pt able to correctly return demonstration on how to stick her finger for blood sugar. Pt also returned demonstration on how to draw up insulin from vial and how to safely inject insulin several times today. Pt able to verbalize what a normal blood sugar level is and what to do if she has a low blood sugar. Pt continues to progress in diabetes education. Will continue to monitor.

## 2013-08-25 NOTE — Progress Notes (Signed)
Subjective: Patient feels better. Her blood sugar is improving. Her po intake is also better. No new complaint. Objective: Vital signs in last 24 hours: Temp:  [98.1 F (36.7 C)-99.5 F (37.5 C)] 98.4 F (36.9 C) (09/11 0400) Pulse Rate:  [112] 112 (09/10 0900) Resp:  [13-26] 14 (09/11 0600) BP: (91-138)/(34-88) 101/69 mmHg (09/11 0600) SpO2:  [100 %] 100 % (09/10 0900) Weight:  [95.6 kg (210 lb 12.2 oz)] 95.6 kg (210 lb 12.2 oz) (09/11 0500) Weight change: -2.3 kg (-5 lb 1.1 oz) Last BM Date: 08/24/13  Intake/Output from previous day: 09/10 0701 - 09/11 0700 In: 1212.3 [P.O.:1190; I.V.:22.3] Out: 1400 [Urine:1400]  PHYSICAL EXAM General appearance: alert, no distress and slowed mentation Resp: clear to auscultation bilaterally Cardio: S1, S2 normal GI: soft, non-tender; bowel sounds normal; no masses,  no organomegaly Extremities: extremities normal, atraumatic, no cyanosis or edema  Lab Results:    @labtest @ [facility] No results found for this basename: PHART, PCO2, PO2ART, TCO2, HCO3,  in the last 72 hours CULTURES Recent Results (from the past 240 hour(s))  CULTURE, BLOOD (ROUTINE X 2)     Status: None   Collection Time    08/21/13 11:20 AM      Result Value Range Status   Specimen Description BLOOD PORTA CATH DRAWN BY RN   Final   Special Requests     Final   Value: BOTTLES DRAWN AEROBIC AND ANAEROBIC AEB=10CC ANA=6CC   Culture NO GROWTH 3 DAYS   Final   Report Status PENDING   Incomplete  CULTURE, BLOOD (ROUTINE X 2)     Status: None   Collection Time    08/21/13 11:38 AM      Result Value Range Status   Specimen Description BLOOD RIGHT ANTECUBITAL   Final   Special Requests BOTTLES DRAWN AEROBIC AND ANAEROBIC 14CC EACH   Final   Culture NO GROWTH 3 DAYS   Final   Report Status PENDING   Incomplete  MRSA PCR SCREENING     Status: None   Collection Time    08/21/13  3:30 PM      Result Value Range Status   MRSA by PCR NEGATIVE  NEGATIVE Final   Comment:             The GeneXpert MRSA Assay (FDA     approved for NASAL specimens     only), is one component of a     comprehensive MRSA colonization     surveillance program. It is not     intended to diagnose MRSA     infection nor to guide or     monitor treatment for     MRSA infections.   Studies/Results: No results found.  Medications: I have reviewed the patient's current medications.  Assesment: Principal Problem:   Hyperglycemia without ketosis Active Problems:   Other malignant lymphomas, unspecified site, extranodal and solid organ sites   Tumor lysis syndrome   Acute renal failure   Acute encephalopathy   Hyperkalemia severe leucopenia    Plan: Continue insulin therapy Will monitor CBC/BMP Continue current fluid and electrolyte management according nephrology recommendation.   LOS: 4 days   Dianelys Scinto 08/25/2013, 8:07 AM

## 2013-08-26 ENCOUNTER — Other Ambulatory Visit (HOSPITAL_COMMUNITY): Payer: PRIVATE HEALTH INSURANCE

## 2013-08-26 LAB — CULTURE, BLOOD (ROUTINE X 2)
Culture: NO GROWTH
Culture: NO GROWTH

## 2013-08-26 LAB — CBC WITH DIFFERENTIAL/PLATELET
Basophils Absolute: 0 10*3/uL (ref 0.0–0.1)
Basophils Relative: 1 % (ref 0–1)
Eosinophils Absolute: 0 10*3/uL (ref 0.0–0.7)
Eosinophils Relative: 1 % (ref 0–5)
HCT: 25.8 % — ABNORMAL LOW (ref 36.0–46.0)
Hemoglobin: 8.2 g/dL — ABNORMAL LOW (ref 12.0–15.0)
Lymphocytes Relative: 69 % — ABNORMAL HIGH (ref 12–46)
Lymphs Abs: 1 10*3/uL (ref 0.7–4.0)
MCH: 27.8 pg (ref 26.0–34.0)
MCHC: 31.8 g/dL (ref 30.0–36.0)
MCV: 87.5 fL (ref 78.0–100.0)
Monocytes Absolute: 0.2 10*3/uL (ref 0.1–1.0)
Monocytes Relative: 16 % — ABNORMAL HIGH (ref 3–12)
Neutro Abs: 0.2 10*3/uL — ABNORMAL LOW (ref 1.7–7.7)
Neutrophils Relative %: 14 % — ABNORMAL LOW (ref 43–77)
Platelets: 25 10*3/uL — CL (ref 150–400)
RBC: 2.95 MIL/uL — ABNORMAL LOW (ref 3.87–5.11)
RDW: 15.7 % — ABNORMAL HIGH (ref 11.5–15.5)
WBC Morphology: INCREASED
WBC: 1.4 10*3/uL — CL (ref 4.0–10.5)

## 2013-08-26 LAB — URINALYSIS, ROUTINE W REFLEX MICROSCOPIC
Bilirubin Urine: NEGATIVE
Glucose, UA: NEGATIVE mg/dL
Ketones, ur: NEGATIVE mg/dL
Nitrite: POSITIVE — AB
Protein, ur: 30 mg/dL — AB
Specific Gravity, Urine: 1.01 (ref 1.005–1.030)
Urobilinogen, UA: 0.2 mg/dL (ref 0.0–1.0)
pH: 6.5 (ref 5.0–8.0)

## 2013-08-26 LAB — GLUCOSE, CAPILLARY
Glucose-Capillary: 170 mg/dL — ABNORMAL HIGH (ref 70–99)
Glucose-Capillary: 185 mg/dL — ABNORMAL HIGH (ref 70–99)
Glucose-Capillary: 187 mg/dL — ABNORMAL HIGH (ref 70–99)
Glucose-Capillary: 181 mg/dL — ABNORMAL HIGH (ref 70–99)

## 2013-08-26 LAB — URINE MICROSCOPIC-ADD ON

## 2013-08-26 LAB — URIC ACID: Uric Acid, Serum: 3.3 mg/dL (ref 2.4–7.0)

## 2013-08-26 LAB — MAGNESIUM: Magnesium: 1.6 mg/dL (ref 1.5–2.5)

## 2013-08-26 LAB — PHOSPHORUS: Phosphorus: 2.5 mg/dL (ref 2.3–4.6)

## 2013-08-26 NOTE — Care Management Note (Signed)
    Page 1 of 1   08/26/2013     2:47:38 PM   CARE MANAGEMENT NOTE 08/26/2013  Patient:  Megan Mcknight, Megan Mcknight   Account Number:  [id number]  Date Initiated:  08/26/2013  Documentation initiated by:  Rosemary Holms  Subjective/Objective Assessment:   Pt lives at home and admitted with uncontrolled diabetes. Pt agrees to [facility] for Diabetic education and followup     Action/Plan:   Anticipated DC Date:     Anticipated DC Plan:  HOME W HOME HEALTH SERVICES      DC Planning Services  CM consult      [facility] Choice  HOME HEALTH   Choice offered to / List presented to:  C-1 Patient        HH arranged  HH-1 RN  HH-10 DISEASE MANAGEMENT      HH agency  [facility]   Status of service:  Completed, signed off Medicare Important Message given?   (If response is "NO", the following Medicare IM given date fields will be blank) Date Medicare IM given:   Date Additional Medicare IM given:    Discharge Disposition:    Per UR Regulation:    If discussed at Long Length of Stay Meetings, dates discussed:    Comments:  08/26/13 [name] RN BSN CM Alroy Bailiff with [facility] notified and pt will be placed on the [employer] Program.

## 2013-08-26 NOTE — Progress Notes (Signed)
Pt states "I feel like I have a bladder infection." Dr. Renard Matter notified, verbal order for urinalysis given. Will continue to monitor.

## 2013-08-26 NOTE — Progress Notes (Signed)
08/26/13 0817 Patient c/o diarrhea this morning, states just started this am. Pt states she discussed with Dr. Felecia Shelling on rounds this am and he instructed her to notify nursing staff if diarrhea persists. Will monitor. Morrie Sheldon Vidal Lampkins,RN

## 2013-08-26 NOTE — Progress Notes (Signed)
Subjective: Patient feels better. Her blood sugar is improving. Her po intake is also better. No new complaint. Objective: Vital signs in last 24 hours: Temp:  [98.9 F (37.2 C)-99.4 F (37.4 C)] 99.4 F (37.4 C) (09/12 0456) Pulse Rate:  [103-105] 103 (09/12 0456) Resp:  [11-21] 18 (09/12 0456) BP: (78-114)/(51-75) 114/74 mmHg (09/12 0456) SpO2:  [96 %] 96 % (09/12 0456) Weight change:  Last BM Date: 08/24/13  Intake/Output from previous day: 09/11 0701 - 09/12 0700 In: 895.8 [P.O.:890; I.V.:5.8] Out: -   PHYSICAL EXAM General appearance: alert, no distress and slowed mentation Resp: clear to auscultation bilaterally Cardio: S1, S2 normal GI: soft, non-tender; bowel sounds normal; no masses,  no organomegaly Extremities: extremities normal, atraumatic, no cyanosis or edema  Lab Results:    @labtest @ [facility] No results found for this basename: PHART, PCO2, PO2ART, TCO2, HCO3,  in the last 72 hours CULTURES Recent Results (from the past 240 hour(s))  CULTURE, BLOOD (ROUTINE X 2)     Status: None   Collection Time    08/21/13 11:20 AM      Result Value Range Status   Specimen Description BLOOD PORTA CATH DRAWN BY RN   Final   Special Requests     Final   Value: BOTTLES DRAWN AEROBIC AND ANAEROBIC AEB=10CC ANA=6CC   Culture NO GROWTH 4 DAYS   Final   Report Status PENDING   Incomplete  CULTURE, BLOOD (ROUTINE X 2)     Status: None   Collection Time    08/21/13 11:38 AM      Result Value Range Status   Specimen Description BLOOD RIGHT ANTECUBITAL   Final   Special Requests BOTTLES DRAWN AEROBIC AND ANAEROBIC 14CC EACH   Final   Culture NO GROWTH 4 DAYS   Final   Report Status PENDING   Incomplete  MRSA PCR SCREENING     Status: None   Collection Time    08/21/13  3:30 PM      Result Value Range Status   MRSA by PCR NEGATIVE  NEGATIVE Final   Comment:            The GeneXpert MRSA Assay (FDA     approved for NASAL specimens     only), is one component of a   comprehensive MRSA colonization     surveillance program. It is not     intended to diagnose MRSA     infection nor to guide or     monitor treatment for     MRSA infections.   Studies/Results: No results found.  Medications: I have reviewed the patient's current medications.  Assesment: Principal Problem:   Hyperglycemia without ketosis Active Problems:   Other malignant lymphomas, unspecified site, extranodal and solid organ sites   Tumor lysis syndrome   Acute renal failure   Acute encephalopathy   Hyperkalemia severe leucopenia    Plan: Continue insulin therapy Will monitor CBC/BMP Neutropenic precaution   LOS: 5 days   Kraven Calk 08/26/2013, 7:49 AM

## 2013-08-26 NOTE — Progress Notes (Signed)
08/26/13 1022 Notified Dr. Felecia Shelling of urinalysis results and urine culture pending. No orders received at this time. Morrie Sheldon Braniyah Besse,RN

## 2013-08-26 NOTE — Plan of Care (Signed)
Problem: Phase I Progression Outcomes Goal: Order "Choose to Live" book from Pharmacy Outcome: Completed/Met Date Met:  08/26/13 08/26/13 1026 Patient states received booklet on diabetes education while in ICU. Devin Going, RN

## 2013-08-27 LAB — GLUCOSE, CAPILLARY
Glucose-Capillary: 180 mg/dL — ABNORMAL HIGH (ref 70–99)
Glucose-Capillary: 215 mg/dL — ABNORMAL HIGH (ref 70–99)
Glucose-Capillary: 118 mg/dL — ABNORMAL HIGH (ref 70–99)
Glucose-Capillary: 164 mg/dL — ABNORMAL HIGH (ref 70–99)

## 2013-08-27 LAB — CBC
HCT: 26.8 % — ABNORMAL LOW (ref 36.0–46.0)
Hemoglobin: 8.6 g/dL — ABNORMAL LOW (ref 12.0–15.0)
MCH: 27.9 pg (ref 26.0–34.0)
MCHC: 32.1 g/dL (ref 30.0–36.0)
MCV: 87 fL (ref 78.0–100.0)
Platelets: 66 10*3/uL — ABNORMAL LOW (ref 150–400)
RBC: 3.08 MIL/uL — ABNORMAL LOW (ref 3.87–5.11)
RDW: 15.9 % — ABNORMAL HIGH (ref 11.5–15.5)
WBC: 4.8 10*3/uL (ref 4.0–10.5)

## 2013-08-27 LAB — BASIC METABOLIC PANEL
BUN: 7 mg/dL (ref 6–23)
CO2: 25 mEq/L (ref 19–32)
Calcium: 8.9 mg/dL (ref 8.4–10.5)
Chloride: 104 mEq/L (ref 96–112)
Creatinine, Ser: 0.74 mg/dL (ref 0.50–1.10)
GFR calc Af Amer: >90 mL/min (ref >90)
GFR calc non Af Amer: >90 mL/min (ref >90)
Glucose, Bld: 215 mg/dL — ABNORMAL HIGH (ref 70–99)
Potassium: 2.7 mEq/L — CL (ref 3.5–5.1)
Sodium: 140 mEq/L (ref 135–145)

## 2013-08-27 LAB — MAGNESIUM: Magnesium: 1.5 mg/dL (ref 1.5–2.5)

## 2013-08-27 LAB — URIC ACID: Uric Acid, Serum: 5 mg/dL (ref 2.4–7.0)

## 2013-08-27 LAB — PHOSPHORUS: Phosphorus: 3.4 mg/dL (ref 2.3–4.6)

## 2013-08-27 MED ORDER — POTASSIUM CHLORIDE 10 MEQ/100ML IV SOLN
10.0000 meq | INTRAVENOUS | Status: AC
  Administered 2013-08-27 (×5): 10 meq via INTRAVENOUS
  Filled 2013-08-27: qty 500

## 2013-08-27 NOTE — Progress Notes (Signed)
Subjective: Patient is feeling better. Her WBC has improved. She has hypokalemia. No fever or chills. Objective: Vital signs in last 24 hours: Temp:  [98.5 F (36.9 C)-99 F (37.2 C)] 98.5 F (36.9 C) (09/13 0435) Pulse Rate:  [102-117] 117 (09/13 0435) Resp:  [18] 18 (09/13 0435) BP: (100-107)/(69-82) 107/82 mmHg (09/13 0435) SpO2:  [97 %-98 %] 98 % (09/13 0435) Weight change:  Last BM Date: 08/26/13  Intake/Output from previous day: 09/12 0701 - 09/13 0700 In: 1990 [P.O.:840; I.V.:1150] Out: 750 [Urine:750]  PHYSICAL EXAM General appearance: alert, no distress and slowed mentation Resp: clear to auscultation bilaterally Cardio: S1, S2 normal GI: soft, non-tender; bowel sounds normal; no masses,  no organomegaly Extremities: extremities normal, atraumatic, no cyanosis or edema  Lab Results:    @labtest @ [facility] No results found for this basename: PHART, PCO2, PO2ART, TCO2, HCO3,  in the last 72 hours CULTURES Recent Results (from the past 240 hour(s))  CULTURE, BLOOD (ROUTINE X 2)     Status: None   Collection Time    08/21/13 11:20 AM      Result Value Range Status   Specimen Description BLOOD PORTA CATH DRAWN BY RN   Final   Special Requests     Final   Value: BOTTLES DRAWN AEROBIC AND ANAEROBIC AEB=10CC ANA=6CC   Culture NO GROWTH 5 DAYS   Final   Report Status 08/26/2013 FINAL   Final  CULTURE, BLOOD (ROUTINE X 2)     Status: None   Collection Time    08/21/13 11:38 AM      Result Value Range Status   Specimen Description BLOOD RIGHT ANTECUBITAL   Final   Special Requests BOTTLES DRAWN AEROBIC AND ANAEROBIC 14CC EACH   Final   Culture NO GROWTH 5 DAYS   Final   Report Status 08/26/2013 FINAL   Final  MRSA PCR SCREENING     Status: None   Collection Time    08/21/13  3:30 PM      Result Value Range Status   MRSA by PCR NEGATIVE  NEGATIVE Final   Comment:            The GeneXpert MRSA Assay (FDA     approved for NASAL specimens     only), is one component  of a     comprehensive MRSA colonization     surveillance program. It is not     intended to diagnose MRSA     infection nor to guide or     monitor treatment for     MRSA infections.   Studies/Results: No results found.  Medications: I have reviewed the patient's current medications.  Assesment: Principal Problem:   Hyperglycemia without ketosis Active Problems:   Other malignant lymphomas, unspecified site, extranodal and solid organ sites   Tumor lysis syndrome   Acute renal failure   Acute encephalopathy   Hyperkalemia severe leucopenia  hypokalemia   Plan: Continue insulin therapy Will monitor CBC/BMP Will replace K+   LOS: 6 days   Tannis Burstein 08/27/2013, 8:11 AM

## 2013-08-28 LAB — BASIC METABOLIC PANEL
BUN: 5 mg/dL — ABNORMAL LOW (ref 6–23)
CO2: 26 mEq/L (ref 19–32)
Calcium: 9.1 mg/dL (ref 8.4–10.5)
Chloride: 106 mEq/L (ref 96–112)
Creatinine, Ser: 0.73 mg/dL (ref 0.50–1.10)
GFR calc Af Amer: >90 mL/min (ref >90)
GFR calc non Af Amer: >90 mL/min (ref >90)
Glucose, Bld: 121 mg/dL — ABNORMAL HIGH (ref 70–99)
Potassium: 2.6 mEq/L — CL (ref 3.5–5.1)
Sodium: 142 mEq/L (ref 135–145)

## 2013-08-28 LAB — GLUCOSE, CAPILLARY
Glucose-Capillary: 123 mg/dL — ABNORMAL HIGH (ref 70–99)
Glucose-Capillary: 166 mg/dL — ABNORMAL HIGH (ref 70–99)
Glucose-Capillary: 191 mg/dL — ABNORMAL HIGH (ref 70–99)
Glucose-Capillary: 152 mg/dL — ABNORMAL HIGH (ref 70–99)

## 2013-08-28 LAB — MYOGLOBIN, URINE: Myoglobin, Ur: <27 mcg/L (ref <28)

## 2013-08-28 LAB — URINE CULTURE: Colony Count: >=100000

## 2013-08-28 LAB — CBC
HCT: 26.6 % — ABNORMAL LOW (ref 36.0–46.0)
Hemoglobin: 8.5 g/dL — ABNORMAL LOW (ref 12.0–15.0)
MCH: 28 pg (ref 26.0–34.0)
MCHC: 32 g/dL (ref 30.0–36.0)
MCV: 87.5 fL (ref 78.0–100.0)
Platelets: DECREASED 10*3/uL (ref 150–400)
RBC: 3.04 MIL/uL — ABNORMAL LOW (ref 3.87–5.11)
RDW: 16.1 % — ABNORMAL HIGH (ref 11.5–15.5)
WBC: 11.2 10*3/uL — ABNORMAL HIGH (ref 4.0–10.5)

## 2013-08-28 MED ORDER — POTASSIUM CHLORIDE 20 MEQ/15ML (10%) PO LIQD
40.0000 meq | Freq: Three times a day (TID) | ORAL | Status: DC
  Administered 2013-08-28 (×3): 40 meq via ORAL
  Filled 2013-08-28: qty 30
  Filled 2013-08-28: qty 90

## 2013-08-28 MED ORDER — POTASSIUM CHLORIDE 10 MEQ/100ML IV SOLN
10.0000 meq | INTRAVENOUS | Status: AC
  Administered 2013-08-28 (×3): 10 meq via INTRAVENOUS
  Filled 2013-08-28 (×3): qty 100

## 2013-08-28 MED ORDER — POTASSIUM CHLORIDE CRYS ER 20 MEQ PO TBCR
40.0000 meq | EXTENDED_RELEASE_TABLET | Freq: Three times a day (TID) | ORAL | Status: DC
  Administered 2013-08-28: 40 meq via ORAL
  Filled 2013-08-28: qty 2

## 2013-08-28 NOTE — Progress Notes (Signed)
Subjective: Patient is feeling better. No complaint. K+ remained very low. Objective: Vital signs in last 24 hours: Temp:  [98.5 F (36.9 C)] 98.5 F (36.9 C) (09/14 1535) Pulse Rate:  [101-113] 113 (09/14 1535) Resp:  [18] 18 (09/14 1535) BP: (105-128)/(51-66) 128/66 mmHg (09/14 1535) SpO2:  [100 %] 100 % (09/14 1535) Weight change:  Last BM Date: 08/26/13  Intake/Output from previous day: 09/13 0701 - 09/14 0700 In: 2007.5 [P.O.:890; I.V.:1117.5] Out: 800 [Urine:800]  PHYSICAL EXAM General appearance: alert, no distress and slowed mentation Resp: clear to auscultation bilaterally Cardio: S1, S2 normal GI: soft, non-tender; bowel sounds normal; no masses,  no organomegaly Extremities: extremities normal, atraumatic, no cyanosis or edema  Lab Results:    @labtest @ [facility] No results found for this basename: PHART, PCO2, PO2ART, TCO2, HCO3,  in the last 72 hours CULTURES Recent Results (from the past 240 hour(s))  CULTURE, BLOOD (ROUTINE X 2)     Status: None   Collection Time    08/21/13 11:20 AM      Result Value Range Status   Specimen Description BLOOD PORTA CATH DRAWN BY RN   Final   Special Requests     Final   Value: BOTTLES DRAWN AEROBIC AND ANAEROBIC AEB=10CC ANA=6CC   Culture NO GROWTH 5 DAYS   Final   Report Status 08/26/2013 FINAL   Final  CULTURE, BLOOD (ROUTINE X 2)     Status: None   Collection Time    08/21/13 11:38 AM      Result Value Range Status   Specimen Description BLOOD RIGHT ANTECUBITAL   Final   Special Requests BOTTLES DRAWN AEROBIC AND ANAEROBIC 14CC EACH   Final   Culture NO GROWTH 5 DAYS   Final   Report Status 08/26/2013 FINAL   Final  MRSA PCR SCREENING     Status: None   Collection Time    08/21/13  3:30 PM      Result Value Range Status   MRSA by PCR NEGATIVE  NEGATIVE Final   Comment:            The GeneXpert MRSA Assay (FDA     approved for NASAL specimens     only), is one component of a     comprehensive MRSA colonization      surveillance program. It is not     intended to diagnose MRSA     infection nor to guide or     monitor treatment for     MRSA infections.  URINE CULTURE     Status: None   Collection Time    08/26/13  8:14 AM      Result Value Range Status   Specimen Description URINE, CLEAN CATCH   Final   Special Requests NONE   Final   Culture  Setup Time     Final   Value: 08/26/2013 12:18     Performed at [facility] Count     Final   Value: >=100,000 COLONIES/ML     Performed at [facility]   Culture     Final   Value: KLEBSIELLA PNEUMONIAE     Performed at [facility]   Report Status 08/28/2013 FINAL   Final   Organism ID, Bacteria KLEBSIELLA PNEUMONIAE   Final   Studies/Results: No results found.  Medications: I have reviewed the patient's current medications.  Assesment: Principal Problem:   Hyperglycemia without ketosis Active Problems:   Other malignant lymphomas, unspecified  site, extranodal and solid organ sites   Tumor lysis syndrome   Acute renal failure   Acute encephalopathy   Hyperkalemia severe leucopenia  hypokalemia   Plan: Continue insulin therapy Will monitor CBC/BMP Will replace K+   LOS: 7 days   Megan Mcknight 08/28/2013, 6:58 PM

## 2013-08-28 NOTE — Progress Notes (Signed)
CRITICAL VALUE ALERT  Critical value received: potassium of 2.7  Date of notification:  08/28/13  Time of notification:  0800  Critical value read back: yes  Nurse who received alert:  Zannie Kehr, RN  MD notified (1st page):  Dr. Felecia Shelling  Time of first page:  0805  MD notified (2nd page):  Time of second page:  Responding MD:  Felecia Shelling  Time MD responded:  [phone]

## 2013-08-28 NOTE — Progress Notes (Signed)
Pt has critical lab... Potassium 2.6. MD paged.

## 2013-08-28 NOTE — Plan of Care (Signed)
Problem: Phase I Progression Outcomes Goal: K+ level approaching normal with therapy Outcome: Not Progressing Potassium levels continue to be critical low this am.  More supplement given.

## 2013-08-29 LAB — BASIC METABOLIC PANEL
BUN: 6 mg/dL (ref 6–23)
CO2: 24 mEq/L (ref 19–32)
Calcium: 9 mg/dL (ref 8.4–10.5)
Chloride: 111 mEq/L (ref 96–112)
Creatinine, Ser: 0.76 mg/dL (ref 0.50–1.10)
GFR calc Af Amer: >90 mL/min (ref >90)
GFR calc non Af Amer: >90 mL/min (ref >90)
Glucose, Bld: 182 mg/dL — ABNORMAL HIGH (ref 70–99)
Potassium: 3.6 mEq/L (ref 3.5–5.1)
Sodium: 142 mEq/L (ref 135–145)

## 2013-08-29 LAB — GLUCOSE, CAPILLARY: Glucose-Capillary: 151 mg/dL — ABNORMAL HIGH (ref 70–99)

## 2013-08-29 MED ORDER — HEPARIN SOD (PORK) LOCK FLUSH 100 UNIT/ML IV SOLN: 500.0000 [IU] | INTRAVENOUS | Status: DC | PRN

## 2013-08-29 MED ORDER — INSULIN GLARGINE 100 UNIT/ML ~~LOC~~ SOLN: 40.0000 [IU] | Freq: Every day | SUBCUTANEOUS | Status: DC

## 2013-08-29 MED ORDER — CIPROFLOXACIN HCL 250 MG PO TABS
500.0000 mg | ORAL_TABLET | Freq: Two times a day (BID) | ORAL | Status: DC
  Administered 2013-08-29: 500 mg via ORAL
  Filled 2013-08-29: qty 2

## 2013-08-29 MED ORDER — HEPARIN SOD (PORK) LOCK FLUSH 100 UNIT/ML IV SOLN
500.0000 [IU] | INTRAVENOUS | Status: DC
  Administered 2013-08-29: 500 [IU]
  Filled 2013-08-29: qty 5

## 2013-08-29 MED ORDER — METFORMIN HCL 500 MG PO TABS: 500.0000 mg | ORAL_TABLET | Freq: Two times a day (BID) | ORAL | Status: DC

## 2013-08-29 MED ORDER — CIPROFLOXACIN HCL 500 MG PO TABS: 500.0000 mg | ORAL_TABLET | Freq: Two times a day (BID) | ORAL | Status: DC

## 2013-08-29 NOTE — Progress Notes (Signed)
D/c instructions reviewed with patient and husband.  Verbalized undertstanding. Pt dc;d to home with husband. Schonewitz, Candelaria Stagers 08/29/2013

## 2013-08-29 NOTE — Discharge Summary (Signed)
Physician Discharge Summary  Patient ID: Megan Mcknight MRN: [id number] DOB/AGE: [birth year] 52 y.o. Primary Care Physician:Yarden Hillis, MD Admit date: 08/21/2013 Discharge date: 08/29/2013    Discharge Diagnoses:   Principal Problem:   Hyperglycemia without ketosis Active Problems:   Other malignant lymphomas, unspecified site, extranodal and solid organ sites   Tumor lysis syndrome   Acute renal failure   Acute encephalopathy   Hyperkalemia leucopenia Hypokalemia UTI    Medication List    STOP taking these medications       predniSONE 20 MG tablet  Commonly known as:  DELTASONE      TAKE these medications       albuterol 108 (90 BASE) MCG/ACT inhaler  Commonly known as:  PROVENTIL HFA;VENTOLIN HFA  Inhale 2 puffs into the lungs every 6 (six) hours as needed for wheezing.     allopurinol 300 MG tablet  Commonly known as:  ZYLOPRIM  Take 1 tablet (300 mg total) by mouth daily.     amLODipine 5 MG tablet  Commonly known as:  NORVASC  Take 1 tablet (5 mg total) by mouth daily.     ciprofloxacin 500 MG tablet  Commonly known as:  CIPRO  Take 1 tablet (500 mg total) by mouth 2 (two) times daily.     ibuprofen 200 MG tablet  Commonly known as:  ADVIL,MOTRIN  Take 400 mg by mouth every 6 (six) hours as needed for pain.     insulin glargine 100 UNIT/ML injection  Commonly known as:  LANTUS  Inject 0.4 mLs (40 Units total) into the skin daily.     lansoprazole 15 MG capsule  Commonly known as:  PREVACID  Take 15 mg by mouth daily.     lidocaine-prilocaine cream  Commonly known as:  EMLA  Apply to port site one hour before treatment and cover with plastic wrap     metFORMIN 500 MG tablet  Commonly known as:  GLUCOPHAGE  Take 1 tablet (500 mg total) by mouth 2 (two) times daily with a meal.     metoprolol tartrate 25 MG tablet  Commonly known as:  LOPRESSOR  Take 1 tablet (25 mg total) by mouth 3 (three) times daily.     montelukast 10 MG tablet   Commonly known as:  SINGULAIR  Take 10 mg by mouth daily as needed (Allergies).     prochlorperazine 10 MG tablet  Commonly known as:  COMPAZINE  Take 1 tablet (10 mg total) by mouth every 6 (six) hours as needed (nausea).     rosuvastatin 10 MG tablet  Commonly known as:  CRESTOR  Take 10 mg by mouth daily.        Discharged Condition: improved    Consults: nephrology, oncology  Significant Diagnostic Studies: Ct Head Wo Contrast  08/21/2013   *RADIOLOGY REPORT*  Clinical Data: Altered mental status.  History of lung and kidney cancer.  CT HEAD WITHOUT CONTRAST  Technique:  Contiguous axial images were obtained from the base of the skull through the vertex without contrast.  Comparison: No priors.  Findings: No acute intracranial abnormalities.  Specifically, no evidence of acute intracranial hemorrhage, no definite findings of acute/subacute cerebral ischemia, no mass, mass effect, hydrocephalus or abnormal intra or extra-axial fluid collections. Visualized paranasal sinuses and mastoids are well pneumatized.  No acute displaced skull fractures are identified.  IMPRESSION: 1.  No acute intracranial abnormalities. 2.  The appearance of the brain is normal.   Original Report Authenticated By: Trudie Reed, M.D.  Dg Chest Portable 1 View  08/21/2013   *RADIOLOGY REPORT*  Clinical Data: Altered mental status.  PORTABLE CHEST - 1 VIEW  Comparison: 05/20/2013  Findings: Right IJ Port-A-Cath has tip overlying the cavoatrial junction.  Lungs are hypoinflated without focal consolidation or effusion.  Cardiomediastinal silhouette and remainder of the exam is unchanged.  IMPRESSION: Hypoinflation without acute cardiopulmonary disease.  Right IJ Port-A-Cath with tip over the cavoatrial junction.   Original Report Authenticated By: Elberta Fortis, M.D.    Lab Results: Basic Metabolic Panel:  Recent Labs  16/10/96 0647 08/28/13 0550 08/29/13 0530  NA 140 142 142  K 2.7* 2.6* 3.6  CL 104  106 111  CO2 25 26 24   GLUCOSE 215* 121* 182*  BUN 7 5* 6  CREATININE 0.74 0.73 0.76  CALCIUM 8.9 9.1 9.0  MG 1.5  --   --   PHOS 3.4  --   --    Liver Function Tests: No results found for this basename: AST, ALT, ALKPHOS, BILITOT, PROT, ALBUMIN,  in the last 72 hours   CBC:  Recent Labs  08/27/13 0647 08/28/13 0550  WBC 4.8 11.2*  HGB 8.6* 8.5*  HCT 26.8* 26.6*  MCV 87.0 87.5  PLT 66* PLATELET CLUMPS NOTED ON SMEAR, COUNT APPEARS DECREASED    Recent Results (from the past 240 hour(s))  CULTURE, BLOOD (ROUTINE X 2)     Status: None   Collection Time    08/21/13 11:20 AM      Result Value Range Status   Specimen Description BLOOD PORTA CATH DRAWN BY RN   Final   Special Requests     Final   Value: BOTTLES DRAWN AEROBIC AND ANAEROBIC AEB=10CC ANA=6CC   Culture NO GROWTH 5 DAYS   Final   Report Status 08/26/2013 FINAL   Final  CULTURE, BLOOD (ROUTINE X 2)     Status: None   Collection Time    08/21/13 11:38 AM      Result Value Range Status   Specimen Description BLOOD RIGHT ANTECUBITAL   Final   Special Requests BOTTLES DRAWN AEROBIC AND ANAEROBIC 14CC EACH   Final   Culture NO GROWTH 5 DAYS   Final   Report Status 08/26/2013 FINAL   Final  MRSA PCR SCREENING     Status: None   Collection Time    08/21/13  3:30 PM      Result Value Range Status   MRSA by PCR NEGATIVE  NEGATIVE Final   Comment:            The GeneXpert MRSA Assay (FDA     approved for NASAL specimens     only), is one component of a     comprehensive MRSA colonization     surveillance program. It is not     intended to diagnose MRSA     infection nor to guide or     monitor treatment for     MRSA infections.  URINE CULTURE     Status: None   Collection Time    08/26/13  8:14 AM      Result Value Range Status   Specimen Description URINE, CLEAN CATCH   Final   Special Requests NONE   Final   Culture  Setup Time     Final   Value: 08/26/2013 12:18     Performed at [facility] Count     Final   Value: >=100,000 COLONIES/ML     Performed  at [facility]   Culture     Final   Value: KLEBSIELLA PNEUMONIAE     Performed at [facility]   Report Status 08/28/2013 FINAL   Final   Organism ID, Bacteria KLEBSIELLA PNEUMONIAE   Final     Hospital Course:  This is a 52 years old female patient with history of lymphoma who was taking chemotherapy was admitted due severe hyperglycemia, acute renal failure, and hyperuricemia. Patient was admitted to ICU and was started on insulin drip and was also rehydrated. Over the hospital stay. She improved. Her glucose improved. Discharged in stable condition to be followed in oncology cllinic.2  Discharge Exam: Blood pressure 120/69, pulse 85, temperature 98.4 F (36.9 C), temperature source Oral, resp. rate 18, height 5\' 8"  (1.727 m), weight 95.6 kg (210 lb 12.2 oz), SpO2 99.00%.   Disposition:  Improved.       Future Appointments Provider Department Dept Phone   09/02/2013 9:20 AM [facility] [phone]   09/05/2013 8:45 AM Krista Blue [facility] MEDICAL ONCOLOGY [phone]   09/05/2013 9:15 AM Marlana Salvage [facility] MEDICAL ONCOLOGY [phone]   09/05/2013 10:15 AM [facility] MEDICAL ONCOLOGY [phone]   09/06/2013 9:00 AM [facility] [place] [facility] MEDICAL ONCOLOGY [phone]   09/07/2013 9:15 AM [facility] MEDICAL ONCOLOGY [phone]   09/08/2013 11:30 AM [facility] Chair [facility] CANCER CENTER [phone]      Follow-up Information   Follow up with Conni Slipper, PA-C On 09/05/2013. [facility]))    Specialty:  Physician Assistant   Contact information:   [address] [facility] [place] [address] [phone]       Follow up with [facility]. (RN for [employer] Program  (diabetic))    Contact information:   [address] [place] [address] [phone]      Follow up with [facility], MD In 1 week.   Specialty:  Internal Medicine   Contact information:   [address] [place] [address] [phone]       Signed: [name]   08/29/2013, 8:03 AM

## 2013-08-30 NOTE — Progress Notes (Signed)
Utilization Review Complete  

## 2013-09-02 ENCOUNTER — Encounter (HOSPITAL_BASED_OUTPATIENT_CLINIC_OR_DEPARTMENT_OTHER): Payer: PRIVATE HEALTH INSURANCE

## 2013-09-02 DIAGNOSIS — C8589 Other specified types of non-Hodgkin lymphoma, extranodal and solid organ sites: Secondary | ICD-10-CM

## 2013-09-02 LAB — CBC WITH DIFFERENTIAL/PLATELET
Basophils Absolute: 0.1 10*3/uL (ref 0.0–0.1)
Basophils Relative: 1 % (ref 0–1)
Eosinophils Absolute: 0 10*3/uL (ref 0.0–0.7)
Eosinophils Relative: 0 % (ref 0–5)
HCT: 26.5 % — ABNORMAL LOW (ref 36.0–46.0)
Hemoglobin: 8.3 g/dL — ABNORMAL LOW (ref 12.0–15.0)
Lymphocytes Relative: 26 % (ref 12–46)
Lymphs Abs: 1.5 10*3/uL (ref 0.7–4.0)
MCH: 28.3 pg (ref 26.0–34.0)
MCHC: 31.3 g/dL (ref 30.0–36.0)
MCV: 90.4 fL (ref 78.0–100.0)
Monocytes Absolute: 0.4 10*3/uL (ref 0.1–1.0)
Monocytes Relative: 7 % (ref 3–12)
Neutro Abs: 3.7 10*3/uL (ref 1.7–7.7)
Neutrophils Relative %: 66 % (ref 43–77)
Platelets: 126 10*3/uL — ABNORMAL LOW (ref 150–400)
RBC: 2.93 MIL/uL — ABNORMAL LOW (ref 3.87–5.11)
RDW: 16.7 % — ABNORMAL HIGH (ref 11.5–15.5)
WBC: 5.7 10*3/uL (ref 4.0–10.5)

## 2013-09-02 NOTE — Progress Notes (Signed)
Labs drawn today for cbc/diff 

## 2013-09-05 ENCOUNTER — Encounter: Payer: Self-pay | Admitting: Physician Assistant

## 2013-09-05 ENCOUNTER — Ambulatory Visit (HOSPITAL_BASED_OUTPATIENT_CLINIC_OR_DEPARTMENT_OTHER): Payer: PRIVATE HEALTH INSURANCE

## 2013-09-05 ENCOUNTER — Encounter: Payer: Self-pay | Admitting: Internal Medicine

## 2013-09-05 ENCOUNTER — Ambulatory Visit (HOSPITAL_BASED_OUTPATIENT_CLINIC_OR_DEPARTMENT_OTHER): Payer: PRIVATE HEALTH INSURANCE | Admitting: Physician Assistant

## 2013-09-05 ENCOUNTER — Other Ambulatory Visit (HOSPITAL_BASED_OUTPATIENT_CLINIC_OR_DEPARTMENT_OTHER): Payer: PRIVATE HEALTH INSURANCE | Admitting: Lab

## 2013-09-05 VITALS — BP 130/75 | HR 92 | Temp 98.6°F | Resp 18 | Ht 68.0 in | Wt 216.8 lb

## 2013-09-05 DIAGNOSIS — IMO0002 Reserved for concepts with insufficient information to code with codable children: Secondary | ICD-10-CM

## 2013-09-05 DIAGNOSIS — C8589 Other specified types of non-Hodgkin lymphoma, extranodal and solid organ sites: Secondary | ICD-10-CM

## 2013-09-05 DIAGNOSIS — E119 Type 2 diabetes mellitus without complications: Secondary | ICD-10-CM

## 2013-09-05 DIAGNOSIS — Z5111 Encounter for antineoplastic chemotherapy: Secondary | ICD-10-CM

## 2013-09-05 LAB — CBC WITH DIFFERENTIAL/PLATELET
BASO%: 0.4 % (ref 0.0–2.0)
Basophils Absolute: 0 10*3/uL (ref 0.0–0.1)
EOS%: 0.1 % (ref 0.0–7.0)
Eosinophils Absolute: 0 10*3/uL (ref 0.0–0.5)
HCT: 28.1 % — ABNORMAL LOW (ref 34.8–46.6)
HGB: 8.5 g/dL — ABNORMAL LOW (ref 11.6–15.9)
LYMPH%: 20.3 % (ref 14.0–49.7)
MCH: 27.2 pg (ref 25.1–34.0)
MCHC: 30.2 g/dL — ABNORMAL LOW (ref 31.5–36.0)
MCV: 89.8 fL (ref 79.5–101.0)
MONO#: 0.5 10*3/uL (ref 0.1–0.9)
MONO%: 6.3 % (ref 0.0–14.0)
NEUT#: 5.3 10*3/uL (ref 1.5–6.5)
NEUT%: 72.9 % (ref 38.4–76.8)
Platelets: 218 10*3/uL (ref 145–400)
RBC: 3.13 10*6/uL — ABNORMAL LOW (ref 3.70–5.45)
RDW: 18.1 % — ABNORMAL HIGH (ref 11.2–14.5)
WBC: 7.3 10*3/uL (ref 3.9–10.3)
lymph#: 1.5 10*3/uL (ref 0.9–3.3)
nRBC: 2 % — ABNORMAL HIGH (ref 0–0)

## 2013-09-05 LAB — LACTATE DEHYDROGENASE (CC13): LDH: 262 U/L — ABNORMAL HIGH (ref 125–245)

## 2013-09-05 MED ORDER — SODIUM CHLORIDE 0.9 % IV SOLN
Freq: Once | INTRAVENOUS | Status: AC
  Administered 2013-09-05: 11:00:00 via INTRAVENOUS

## 2013-09-05 MED ORDER — ONDANSETRON 16 MG/50ML IVPB (CHCC)
16.0000 mg | Freq: Once | INTRAVENOUS | Status: AC
  Administered 2013-09-05: 16 mg via INTRAVENOUS

## 2013-09-05 MED ORDER — SODIUM CHLORIDE 0.9 % IV SOLN
80.0000 mg/m2 | Freq: Once | INTRAVENOUS | Status: AC
  Administered 2013-09-05: 170 mg via INTRAVENOUS
  Filled 2013-09-05: qty 8.5

## 2013-09-05 MED ORDER — SODIUM CHLORIDE 0.9 % IV SOLN
600.0000 mg/m2 | Freq: Once | INTRAVENOUS | Status: AC
  Administered 2013-09-05: 1300 mg via INTRAVENOUS
  Filled 2013-09-05: qty 65

## 2013-09-05 MED ORDER — DEXAMETHASONE SODIUM PHOSPHATE 20 MG/5ML IJ SOLN
INTRAMUSCULAR | Status: AC
  Filled 2013-09-05: qty 5

## 2013-09-05 MED ORDER — HEPARIN SOD (PORK) LOCK FLUSH 100 UNIT/ML IV SOLN
500.0000 [IU] | Freq: Once | INTRAVENOUS | Status: AC | PRN
  Administered 2013-09-05: 500 [IU]
  Filled 2013-09-05: qty 5

## 2013-09-05 MED ORDER — SODIUM CHLORIDE 0.9 % IJ SOLN
10.0000 mL | INTRAMUSCULAR | Status: DC | PRN
  Administered 2013-09-05: 10 mL
  Filled 2013-09-05: qty 10

## 2013-09-05 MED ORDER — VINCRISTINE SULFATE CHEMO INJECTION 1 MG/ML
2.0000 mg | Freq: Once | INTRAVENOUS | Status: AC
  Administered 2013-09-05: 2 mg via INTRAVENOUS
  Filled 2013-09-05: qty 2

## 2013-09-05 MED ORDER — ONDANSETRON 16 MG/50ML IVPB (CHCC)
INTRAVENOUS | Status: AC
  Filled 2013-09-05: qty 16

## 2013-09-05 MED ORDER — SODIUM CHLORIDE 0.9 % IV SOLN
150.0000 mg | Freq: Once | INTRAVENOUS | Status: AC
  Administered 2013-09-05: 150 mg via INTRAVENOUS
  Filled 2013-09-05: qty 5

## 2013-09-05 MED ORDER — DOXORUBICIN HCL CHEMO IV INJECTION 2 MG/ML
40.0000 mg/m2 | Freq: Once | INTRAVENOUS | Status: AC
  Administered 2013-09-05: 86 mg via INTRAVENOUS
  Filled 2013-09-05: qty 43

## 2013-09-05 MED ORDER — DEXAMETHASONE SODIUM PHOSPHATE 20 MG/5ML IJ SOLN: 20.0000 mg | Freq: Once | INTRAMUSCULAR | Status: DC

## 2013-09-05 NOTE — Patient Instructions (Signed)
Continue weekly labs as scheduled Be sure to get your Neulasta injection at [facility] on 09/08/2013 as scheduled Followup with Dr. Rosie Fate in 3 weeks with a restaging PET scan to reevaluate her disease

## 2013-09-05 NOTE — Progress Notes (Signed)
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  Her last 5th chemotherapy cycle was complicated by ICU-level admission for severe hyperglycemic state (BG 1763) with AKI (creatinine 2.76), hypokalemia w EKG changes (K+ 6.3) and encephalopathy.  She was discharged on metformin and lantus and was instructed not to take any additional prednisone.  Today, she will proceed with cycle #6 of her systemic chemotherapy with CHOEP but without the prednisone or the dexamethasone due to complications as noted above. The patient was in agreement with this plan.   I personally saw this patient and performed a substantive portion of this encounter with the listed APP documented above.   Star Cheese, MD

## 2013-09-05 NOTE — Patient Instructions (Addendum)
[facility] Discharge Instructions for Patients Receiving Chemotherapy  Today you received the following chemotherapy agents: Adriamycin, Vincristine, Cytoxan and Etoposide.  To help prevent nausea and vomiting after your treatment, we encourage you to take your nausea medication, Compazine. Take one every six hours as needed.   If you develop nausea and vomiting that is not controlled by your nausea medication, call the clinic.   BELOW ARE SYMPTOMS THAT SHOULD BE REPORTED IMMEDIATELY:  *FEVER GREATER THAN 100.5 F  *CHILLS WITH OR WITHOUT FEVER  NAUSEA AND VOMITING THAT IS NOT CONTROLLED WITH YOUR NAUSEA MEDICATION  *UNUSUAL SHORTNESS OF BREATH  *UNUSUAL BRUISING OR BLEEDING  TENDERNESS IN MOUTH AND THROAT WITH OR WITHOUT PRESENCE OF ULCERS  *URINARY PROBLEMS  *BOWEL PROBLEMS  UNUSUAL RASH Items with * indicate a potential emergency and should be followed up as soon as possible.  Feel free to call the clinic should you have any questions or concerns. The clinic phone number is [phone].

## 2013-09-05 NOTE — Progress Notes (Signed)
CC:   Salvatore Decent. Dorris Fetch, M.D. [name] D. Felecia Shelling, MD  PROBLEM LIST: 1. 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 Dorris Fetch, M.D. There is also involvement of the lungs with     multiple lung nodules, liver and bilateral inguinal lymph nodes,     the latter seen on PET scan from 05/30/2013.  Bone marrow on     05/18/2013 was negative.  IPI score was 2.  Histologic diagnosis     was made on 05/13/2013 by mediastinal lymph node biopsy.     Chemotherapy with Cytoxan, Adriamycin, vincristine, etoposide and     prednisone in conjunction with Neulasta was initiated on     05/20/2013. 2. Brief episode of atrial fibrillation with rapid ventricular     response in late May 2014 at time of lymphoma presentation. 3. Dyslipidemia. 4. Seasonal allergies. 5. Diverticulosis. 6. Status post hysterectomy. 7. Right-sided Port-A-Cath placed on 05/23/2013. 8. Hyperglycemia/Diabetes Mellitus  MEDICATIONS:  Reviewed and recorded. Current Outpatient Prescriptions  Medication Sig Dispense Refill  . albuterol (PROVENTIL HFA;VENTOLIN HFA) 108 (90 BASE) MCG/ACT inhaler Inhale 2 puffs into the lungs every 6 (six) hours as needed for wheezing.      Marland Kitchen allopurinol (ZYLOPRIM) 300 MG tablet Take 1 tablet (300 mg total) by mouth daily.  30 tablet  1  . amLODipine (NORVASC) 5 MG tablet Take 1 tablet (5 mg total) by mouth daily.  30 tablet  0  . ciprofloxacin (CIPRO) 500 MG tablet Take 1 tablet (500 mg total) by mouth 2 (two) times daily.  10 tablet  0  . ibuprofen (ADVIL,MOTRIN) 200 MG tablet Take 400 mg by mouth every 6 (six) hours as needed for pain.      Marland Kitchen insulin glargine (LANTUS) 100 UNIT/ML injection Inject 0.4 mLs (40 Units total) into the skin daily.  10 mL  12  . lansoprazole (PREVACID) 15 MG capsule Take 15 mg by mouth daily.      Marland Kitchen  lidocaine-prilocaine (EMLA) cream Apply to port site one hour before treatment and cover with plastic wrap  1 each  3  . metFORMIN (GLUCOPHAGE) 500 MG tablet Take 1 tablet (500 mg total) by mouth 2 (two) times daily with a meal.  60 tablet  3  . metoprolol tartrate (LOPRESSOR) 25 MG tablet Take 1 tablet (25 mg total) by mouth 3 (three) times daily.  90 tablet  0  . montelukast (SINGULAIR) 10 MG tablet Take 10 mg by mouth daily as needed (Allergies).       . prochlorperazine (COMPAZINE) 10 MG tablet Take 1 tablet (10 mg total) by mouth every 6 (six) hours as needed (nausea).  30 tablet  2  . rosuvastatin (CRESTOR) 10 MG tablet Take 10 mg by mouth daily.       No current facility-administered medications for this visit.   Facility-Administered Medications Ordered in Other Visits  Medication Dose Route Frequency Provider Last Rate Last Dose  . Dexamethasone Sodium Phosphate (DECADRON) injection 20 mg  20 mg Intravenous Once Emmett Bracknell E Ellis Koffler, PA-C      . fosaprepitant (EMEND) 150 mg in sodium chloride 0.9 % 145 mL IVPB  150 mg Intravenous Once Clarity Ciszek E Adrina Armijo, PA-C      . ondansetron (ZOFRAN) IVPB 16 mg  16 mg Intravenous Once Channah Godeaux E  Ellieana Dolecki, PA-C   16 mg at 09/05/13 1101     SMOKING HISTORY:  The patient smoked half a pack cigarettes a day for over 20 years.  She stopped smoking in early May 2014.    HISTORY:  Megan Mcknight is a 52 year old married African American female  who had been in fairly good health up until a few months ago when she presented with a respiratory illness characterized by fever, cough and shortness of breath.  The patient was found to have extensive pulmonary nodules on a chest CT scan from 05/10/2013.  She underwent video bronchoscopy with endobronchial ultrasound and mediastinoscopy on 05/13/2013 by Salvatore Decent. Dorris Fetch, M.D. The mediastinal biopsies showed anaplastic large-cell lymphoma, CD 30 and ALK positive. CD 20 negative.  Tumor was high-grade stage IV with  an IPI score of 2.  At that point, the patient was running low-grade fever, was on oxygen.  She underwent a bone marrow aspirate and biopsy on 05/18/2013 that was negative for lymphoma.  Chemotherapy with CHOEP regimen was started on 05/20/2013.  Dr. Vedia Pereyra reduced the doses slightly to avoid profound bone marrow suppression.  The patient received Neulasta while she was hospitalized following her treatments and then subsequently received Neupogen daily while hospitalized and then on 05/25/2013, after she was discharged from the hospital, she received Neulasta 6 mg subcu.  The patient had a Port-A-Cath placed via interventional radiology on the right side on 05/23/2013.  She was not able to get a PET scan while hospitalized but did have a PET scan on 05/30/2013.  The scan shows only uptake in bilateral inguinal lymph nodes that are not really significantly enlarged.  The patient has had a striking regression of tumor in her lungs, at least 90%.  There has also been a decrease in the liver masses.  None of those areas showed increased uptake, interestingly enough. She is S/p 5 cycles now.  After cycle 5 of her systemic chemotherapy with CHOP she was hospitalized at [facility] with a severe hyperglycemic state with coma. She also presented with acute renal failure, hypokalemia with EKG changes acute encephalopathy all felt related to the severe hyper glycemia. His felt that she also may have had some tumor lysis syndrome however that was felt to be less likely as this was occurring after 5 full cycles of chemotherapy. She had a glucose level of 1763, creatinine of 2.76, potassium of 6.3, and a calcium of 10.7, her hemoglobin A1C was 12.4. She was admitted to the intensive care unit and treated aggressively with IV fluids and insulin. By day of discharge her potassium level had normalized, her glucose was 182 and her calcium had normalized as well. She was told not to take any further  steroids. She is concerned as the prednisone is part of her chemotherapy treatment for her lymphoma, however she also does not want a repeat of her extremely high blood sugars. She was discharged on metformin 500 mg twice daily  as well as Lantus insulin 40 units at bedtime. She was discharged a week ago and still feels a little weak and is having some remaining blurred vision likely secondary to the hyperglycemia. She plans to see an ophthalmologist regarding her vision. She currently gets her weekly labs as well as her Neulasta injections at [facility] as this is more convenient for her. She continues to note some numbness at the tips of her fingers but has no loss of fine motor dexterity. She denied any fever, chills, nausea,  vomiting, constipation or diarrhea. She presents to proceed with cycle #6 of her systemic chemotherapy with CHOP. She has lost her hair. She only has some minimal dyspnea on exertion and is not using any oxygen.  She denies any pain.  She denied cough. She denied a mucositis or night sweats. She reports her vulvar irritation resolved completely after switching to a milder soap for this area.     Blood pressure 130/75, pulse 92, temperature 98.6 F (37 C), temperature source Oral, resp. rate 18, height 5\' 8"  (1.727 m), weight 216 lb 12.8 oz (98.34 kg), SpO2 100.00%. PHYSICAL EXAMINATION:  The patient looks well.  She is 52 years old. She is in no acute distress.  There is no scleral icterus.  Mouth and pharynx - no evidence of thrush or mucositis. She has some missing teeth, particular the molars.  Scalp alopecia from the chemotherapy. There is no peripheral adenopathy palpable in the neck, axillary or inguinal areas.  Heart and lungs are normal.  Breasts are not examined. There is a right-sided Port-A-Cath. Port-A-Cath nontender, no erythema non-fluctuant, incision well-healed   Abdomen is somewhat obese, nontender with no organomegaly or masses palpable.    Extremities:  No peripheral edema or clubbing.  No dorsiflexor weakness. Neurologic exam is normal.  LABORATORY DATA:   CBC    Component Value Date/Time   WBC 7.3 09/05/2013 0909   WBC 5.7 09/02/2013 0948   RBC 3.13* 09/05/2013 0909   RBC 2.93* 09/02/2013 0948   HGB 8.5* 09/05/2013 0909   HGB 8.3* 09/02/2013 0948   HCT 28.1* 09/05/2013 0909   HCT 26.5* 09/02/2013 0948   PLT 218 09/05/2013 0909   PLT 126* 09/02/2013 0948   MCV 89.8 09/05/2013 0909   MCV 90.4 09/02/2013 0948   MCH 27.2 09/05/2013 0909   MCH 28.3 09/02/2013 0948   MCHC 30.2* 09/05/2013 0909   MCHC 31.3 09/02/2013 0948   RDW 18.1* 09/05/2013 0909   RDW 16.7* 09/02/2013 0948   LYMPHSABS 1.5 09/05/2013 0909   LYMPHSABS 1.5 09/02/2013 0948   MONOABS 0.5 09/05/2013 0909   MONOABS 0.4 09/02/2013 0948   EOSABS 0.0 09/05/2013 0909   EOSABS 0.0 09/02/2013 0948   BASOSABS 0.0 09/05/2013 0909   BASOSABS 0.1 09/02/2013 0948    Lab Results  Component Value Date   GLUCOSE 182* 08/29/2013   BUN 6 08/29/2013   CO2 24 08/29/2013   ALT 29 07/25/2013   AST 20 07/25/2013   LDH 221 07/25/2013   K 3.6 08/29/2013   CREATININE 0.76 08/29/2013    IMAGING STUDIES: 1. Chest x-ray, 2 view, from 03/09/2013 showed no active disease. 2. 2-D echocardiogram on 05/06/2013 showed  moderate LVH with     disproportionate septal thickening.  Systolic function was     hyperdynamic.  The estimated ejection fraction was 75%.  Wall     motion was normal.  There were no regional wall motion     abnormalities. 3. Chest x-ray, 1 view, on 05/09/2013 showed diffuse micronodular     pattern with worsening density at the lung bases. 4. CT scan of the chest without IV contrast on 05/10/2013 showed     diffuse pulmonary nodules and enlarged bilateral mediastinal lymph     nodes, all concerning for metastatic disease.  There were multi     focal low attenuation lesions within the liver also concerning for     metastatic disease. 5. PET scan on 05/30/2013 showed small  hypermetabolic bilateral  inguinal lymph nodes with maximum SUV of 9.8 for the right inguinal     lymph node and maximum SUV of 6.8 for the left inguinal lymph node.     There was intense hypermetabolic activity throughout the axial and     appendicular skeleton felt to be due to either marrow involvement     by lymphoma or pharmacologic marrow hyperstimulation.  There were     numerous diffuse tiny pulmonary nodules that showed no     hypermetabolic activity but may have been below the PET size     threshold for detection of malignancy.   IMPRESSION AND PLAN:  Mrs. Sandiford has had an excellent clinical response to her first 5 cycles of chemotherapy. Given her recent admission for severe hyperglycemia likely aggravated by the steroids that are part of her current chemotherapy, we will discontinue the prednisone as well as the dexamethasone with cycle #6. She has received 5 full cycles of CHOP prior to her recent admission with severe hyperglycemia. She currently is being treated metformin and Lantus for her diabetes. She will followup with her primary care physician Dr. [name] regarding her diabetes management. The patient was discussed with and seen by Dr. Rosie Fate. She will continue to get her weekly labs and her Neulasta injections at [facility] as is more convenient for her. She will proceed with cycle #6 of her systemic chemotherapy with CHOP but without the prednisone or the dexamethasone. For nausea she will be treated with Zofran and Emend with this last cycle of chemotherapy. She will continue with her weekly labs as scheduled. She is scheduled for labs and her Neulasta injection at [facility] on 09/08/2013 at 11:30 AM. She will followup with Dr. Rosie Fate in 3 weeks with a restaging PET scan to reevaluate her disease.   Laural Benes, Marlaina Coburn E, PA-C

## 2013-09-06 ENCOUNTER — Ambulatory Visit: Payer: PRIVATE HEALTH INSURANCE | Admitting: Physician Assistant

## 2013-09-06 ENCOUNTER — Ambulatory Visit (HOSPITAL_BASED_OUTPATIENT_CLINIC_OR_DEPARTMENT_OTHER): Payer: PRIVATE HEALTH INSURANCE

## 2013-09-06 VITALS — BP 114/74 | HR 76 | Temp 98.0°F

## 2013-09-06 DIAGNOSIS — Z5111 Encounter for antineoplastic chemotherapy: Secondary | ICD-10-CM

## 2013-09-06 DIAGNOSIS — C8589 Other specified types of non-Hodgkin lymphoma, extranodal and solid organ sites: Secondary | ICD-10-CM

## 2013-09-06 DIAGNOSIS — IMO0002 Reserved for concepts with insufficient information to code with codable children: Secondary | ICD-10-CM

## 2013-09-06 DIAGNOSIS — Z95828 Presence of other vascular implants and grafts: Secondary | ICD-10-CM

## 2013-09-06 MED ORDER — HEPARIN SOD (PORK) LOCK FLUSH 100 UNIT/ML IV SOLN
500.0000 [IU] | Freq: Once | INTRAVENOUS | Status: AC
  Administered 2013-09-06: 500 [IU] via INTRAVENOUS
  Filled 2013-09-06: qty 5

## 2013-09-06 MED ORDER — SODIUM CHLORIDE 0.9 % IV SOLN
Freq: Once | INTRAVENOUS | Status: AC
  Administered 2013-09-06: 10:00:00 via INTRAVENOUS

## 2013-09-06 MED ORDER — SODIUM CHLORIDE 0.9 % IJ SOLN
10.0000 mL | INTRAMUSCULAR | Status: DC | PRN
  Administered 2013-09-06: 10 mL via INTRAVENOUS
  Filled 2013-09-06: qty 10

## 2013-09-06 MED ORDER — ONDANSETRON 16 MG/50ML IVPB (CHCC)
INTRAVENOUS | Status: AC
  Filled 2013-09-06: qty 16

## 2013-09-06 MED ORDER — ETOPOSIDE CHEMO INJECTION 1 GM/50ML
80.0000 mg/m2 | Freq: Once | INTRAVENOUS | Status: AC
  Administered 2013-09-06: 170 mg via INTRAVENOUS
  Filled 2013-09-06: qty 8.5

## 2013-09-06 MED ORDER — ONDANSETRON 16 MG/50ML IVPB (CHCC)
16.0000 mg | Freq: Once | INTRAVENOUS | Status: AC
  Administered 2013-09-06: 16 mg via INTRAVENOUS

## 2013-09-06 NOTE — Patient Instructions (Addendum)
[facility] Discharge Instructions for Patients Receiving Chemotherapy  Today you received the following chemotherapy agents [id number] To help prevent nausea and vomiting after your treatment, we encourage you to take your nausea medication -Compazine 10 mg every 6 hours as needed for nausea. If you develop nausea and vomiting that is not controlled by your nausea medication, call the clinic.   BELOW ARE SYMPTOMS THAT SHOULD BE REPORTED IMMEDIATELY:  *FEVER GREATER THAN 100.5 F  *CHILLS WITH OR WITHOUT FEVER  NAUSEA AND VOMITING THAT IS NOT CONTROLLED WITH YOUR NAUSEA MEDICATION  *UNUSUAL SHORTNESS OF BREATH  *UNUSUAL BRUISING OR BLEEDING  TENDERNESS IN MOUTH AND THROAT WITH OR WITHOUT PRESENCE OF ULCERS  *URINARY PROBLEMS  *BOWEL PROBLEMS  UNUSUAL RASH Items with * indicate a potential emergency and should be followed up as soon as possible.  Feel free to call the clinic you have any questions or concerns. The clinic phone number is [phone].

## 2013-09-07 ENCOUNTER — Telehealth: Payer: Self-pay | Admitting: Internal Medicine

## 2013-09-07 ENCOUNTER — Ambulatory Visit (HOSPITAL_BASED_OUTPATIENT_CLINIC_OR_DEPARTMENT_OTHER): Payer: PRIVATE HEALTH INSURANCE

## 2013-09-07 VITALS — BP 131/78 | HR 76 | Temp 97.3°F | Resp 18

## 2013-09-07 DIAGNOSIS — C8589 Other specified types of non-Hodgkin lymphoma, extranodal and solid organ sites: Secondary | ICD-10-CM

## 2013-09-07 DIAGNOSIS — IMO0002 Reserved for concepts with insufficient information to code with codable children: Secondary | ICD-10-CM

## 2013-09-07 DIAGNOSIS — Z5111 Encounter for antineoplastic chemotherapy: Secondary | ICD-10-CM

## 2013-09-07 MED ORDER — SODIUM CHLORIDE 0.9 % IV SOLN
Freq: Once | INTRAVENOUS | Status: AC
  Administered 2013-09-07: 10:00:00 via INTRAVENOUS

## 2013-09-07 MED ORDER — HEPARIN SOD (PORK) LOCK FLUSH 100 UNIT/ML IV SOLN
500.0000 [IU] | Freq: Once | INTRAVENOUS | Status: AC
  Administered 2013-09-07: 500 [IU] via INTRAVENOUS
  Filled 2013-09-07: qty 5

## 2013-09-07 MED ORDER — SODIUM CHLORIDE 0.9 % IJ SOLN
10.0000 mL | INTRAMUSCULAR | Status: DC | PRN
  Administered 2013-09-07: 10 mL via INTRAVENOUS
  Filled 2013-09-07: qty 10

## 2013-09-07 MED ORDER — ONDANSETRON 16 MG/50ML IVPB (CHCC)
INTRAVENOUS | Status: AC
  Filled 2013-09-07: qty 16

## 2013-09-07 MED ORDER — SODIUM CHLORIDE 0.9 % IV SOLN
80.0000 mg/m2 | Freq: Once | INTRAVENOUS | Status: AC
  Administered 2013-09-07: 170 mg via INTRAVENOUS
  Filled 2013-09-07: qty 8.5

## 2013-09-07 MED ORDER — ONDANSETRON 16 MG/50ML IVPB (CHCC)
16.0000 mg | Freq: Once | INTRAVENOUS | Status: AC
  Administered 2013-09-07: 16 mg via INTRAVENOUS

## 2013-09-07 NOTE — Telephone Encounter (Signed)
s.w. pt and advised on all OCT appts...pt ok and aware

## 2013-09-07 NOTE — Patient Instructions (Addendum)
[facility] Discharge Instructions for Patients Receiving Chemotherapy  Today you received the following chemotherapy agents: Etoposide.  To help prevent nausea and vomiting after your treatment, we encourage you to take your nausea medication as prescribed.   If you develop nausea and vomiting that is not controlled by your nausea medication, call the clinic.   BELOW ARE SYMPTOMS THAT SHOULD BE REPORTED IMMEDIATELY:  *FEVER GREATER THAN 100.5 F  *CHILLS WITH OR WITHOUT FEVER  NAUSEA AND VOMITING THAT IS NOT CONTROLLED WITH YOUR NAUSEA MEDICATION  *UNUSUAL SHORTNESS OF BREATH  *UNUSUAL BRUISING OR BLEEDING  TENDERNESS IN MOUTH AND THROAT WITH OR WITHOUT PRESENCE OF ULCERS  *URINARY PROBLEMS  *BOWEL PROBLEMS  UNUSUAL RASH Items with * indicate a potential emergency and should be followed up as soon as possible.  Feel free to call the clinic you have any questions or concerns. The clinic phone number is [phone].    

## 2013-09-08 ENCOUNTER — Encounter (HOSPITAL_BASED_OUTPATIENT_CLINIC_OR_DEPARTMENT_OTHER): Payer: PRIVATE HEALTH INSURANCE

## 2013-09-08 VITALS — BP 113/79 | HR 72 | Temp 98.2°F | Resp 18

## 2013-09-08 DIAGNOSIS — IMO0002 Reserved for concepts with insufficient information to code with codable children: Secondary | ICD-10-CM

## 2013-09-08 DIAGNOSIS — C8589 Other specified types of non-Hodgkin lymphoma, extranodal and solid organ sites: Secondary | ICD-10-CM

## 2013-09-08 MED ORDER — PEGFILGRASTIM INJECTION 6 MG/0.6ML
SUBCUTANEOUS | Status: AC
  Filled 2013-09-08: qty 0.6

## 2013-09-08 MED ORDER — PEGFILGRASTIM INJECTION 6 MG/0.6ML
6.0000 mg | Freq: Once | SUBCUTANEOUS | Status: AC
  Administered 2013-09-08: 6 mg via SUBCUTANEOUS

## 2013-09-08 NOTE — Progress Notes (Signed)
Megan Mcknight presents today for injection per MD orders. Neulasta 6mg administered SQ in right Abdomen. Administration without incident. Patient tolerated well.  

## 2013-09-15 ENCOUNTER — Other Ambulatory Visit: Payer: Self-pay | Admitting: Hematology and Oncology

## 2013-09-16 ENCOUNTER — Encounter (HOSPITAL_COMMUNITY): Payer: PRIVATE HEALTH INSURANCE | Attending: Oncology

## 2013-09-16 DIAGNOSIS — C8589 Other specified types of non-Hodgkin lymphoma, extranodal and solid organ sites: Secondary | ICD-10-CM | POA: Insufficient documentation

## 2013-09-16 DIAGNOSIS — C962 Malignant mast cell neoplasm, unspecified: Secondary | ICD-10-CM

## 2013-09-16 LAB — CBC WITH DIFFERENTIAL/PLATELET
Basophils Absolute: 0.2 10*3/uL — ABNORMAL HIGH (ref 0.0–0.1)
Basophils Relative: 2 % — ABNORMAL HIGH (ref 0–1)
Eosinophils Absolute: 0 10*3/uL (ref 0.0–0.7)
Eosinophils Relative: 0 % (ref 0–5)
HCT: 26.4 % — ABNORMAL LOW (ref 36.0–46.0)
Hemoglobin: 8.6 g/dL — ABNORMAL LOW (ref 12.0–15.0)
Lymphocytes Relative: 13 % (ref 12–46)
Lymphs Abs: 1.8 10*3/uL (ref 0.7–4.0)
MCH: 28.9 pg (ref 26.0–34.0)
MCHC: 32.6 g/dL (ref 30.0–36.0)
MCV: 88.6 fL (ref 78.0–100.0)
Monocytes Absolute: 2.8 10*3/uL — ABNORMAL HIGH (ref 0.1–1.0)
Monocytes Relative: 20 % — ABNORMAL HIGH (ref 3–12)
Neutro Abs: 8.9 10*3/uL — ABNORMAL HIGH (ref 1.7–7.7)
Neutrophils Relative %: 65 % (ref 43–77)
Platelets: 113 10*3/uL — ABNORMAL LOW (ref 150–400)
RBC: 2.98 MIL/uL — ABNORMAL LOW (ref 3.87–5.11)
RDW: 16.3 % — ABNORMAL HIGH (ref 11.5–15.5)
WBC: 13.7 10*3/uL — ABNORMAL HIGH (ref 4.0–10.5)

## 2013-09-16 NOTE — Progress Notes (Addendum)
Labs drawn today for cbc/diff 

## 2013-09-23 ENCOUNTER — Encounter (HOSPITAL_BASED_OUTPATIENT_CLINIC_OR_DEPARTMENT_OTHER): Payer: PRIVATE HEALTH INSURANCE

## 2013-09-23 DIAGNOSIS — C8589 Other specified types of non-Hodgkin lymphoma, extranodal and solid organ sites: Secondary | ICD-10-CM

## 2013-09-23 LAB — CBC WITH DIFFERENTIAL/PLATELET
Basophils Absolute: 0.1 10*3/uL (ref 0.0–0.1)
Basophils Relative: 2 % — ABNORMAL HIGH (ref 0–1)
Eosinophils Absolute: 0 10*3/uL (ref 0.0–0.7)
Eosinophils Relative: 0 % (ref 0–5)
HCT: 21 % — ABNORMAL LOW (ref 36.0–46.0)
Hemoglobin: 7.3 g/dL — ABNORMAL LOW (ref 12.0–15.0)
Lymphocytes Relative: 30 % (ref 12–46)
Lymphs Abs: 1.4 10*3/uL (ref 0.7–4.0)
MCH: 35.1 pg — ABNORMAL HIGH (ref 26.0–34.0)
MCHC: 34.8 g/dL (ref 30.0–36.0)
MCV: 101 fL — ABNORMAL HIGH (ref 78.0–100.0)
Monocytes Absolute: 0.5 10*3/uL (ref 0.1–1.0)
Monocytes Relative: 12 % (ref 3–12)
Neutro Abs: 2.6 10*3/uL (ref 1.7–7.7)
Neutrophils Relative %: 57 % (ref 43–77)
Platelets: 226 10*3/uL (ref 150–400)
RBC: 2.08 MIL/uL — ABNORMAL LOW (ref 3.87–5.11)
RDW: 26 % — ABNORMAL HIGH (ref 11.5–15.5)
WBC: 4.6 10*3/uL (ref 4.0–10.5)

## 2013-09-23 NOTE — Progress Notes (Signed)
Labs drawn today for cbc/diff 

## 2013-09-26 ENCOUNTER — Encounter (HOSPITAL_COMMUNITY)
Admission: RE | Admit: 2013-09-26 | Discharge: 2013-09-26 | Disposition: A | Discharge status: Home / Self Care | Payer: PRIVATE HEALTH INSURANCE | Source: Ambulatory Visit | Attending: Physician Assistant | Admitting: Physician Assistant

## 2013-09-26 ENCOUNTER — Encounter (HOSPITAL_COMMUNITY): Payer: Self-pay

## 2013-09-26 DIAGNOSIS — C8589 Other specified types of non-Hodgkin lymphoma, extranodal and solid organ sites: Secondary | ICD-10-CM | POA: Insufficient documentation

## 2013-09-26 LAB — GLUCOSE, CAPILLARY: Glucose-Capillary: 53 mg/dL — ABNORMAL LOW (ref 70–99)

## 2013-09-26 MED ORDER — FLUDEOXYGLUCOSE F - 18 (FDG) INJECTION
19.6000 | Freq: Once | INTRAVENOUS | Status: AC | PRN
  Administered 2013-09-26: 19.6 via INTRAVENOUS

## 2013-09-28 ENCOUNTER — Other Ambulatory Visit: Payer: Self-pay | Admitting: Internal Medicine

## 2013-09-28 DIAGNOSIS — C8589 Other specified types of non-Hodgkin lymphoma, extranodal and solid organ sites: Secondary | ICD-10-CM

## 2013-09-29 ENCOUNTER — Other Ambulatory Visit (HOSPITAL_BASED_OUTPATIENT_CLINIC_OR_DEPARTMENT_OTHER): Payer: PRIVATE HEALTH INSURANCE | Admitting: Lab

## 2013-09-29 ENCOUNTER — Ambulatory Visit (HOSPITAL_BASED_OUTPATIENT_CLINIC_OR_DEPARTMENT_OTHER): Payer: PRIVATE HEALTH INSURANCE | Admitting: Internal Medicine

## 2013-09-29 ENCOUNTER — Telehealth: Payer: Self-pay | Admitting: Internal Medicine

## 2013-09-29 VITALS — BP 127/83 | HR 92 | Temp 97.7°F | Resp 18 | Ht 68.0 in | Wt 210.1 lb

## 2013-09-29 DIAGNOSIS — IMO0002 Reserved for concepts with insufficient information to code with codable children: Secondary | ICD-10-CM

## 2013-09-29 DIAGNOSIS — E119 Type 2 diabetes mellitus without complications: Secondary | ICD-10-CM

## 2013-09-29 DIAGNOSIS — D6481 Anemia due to antineoplastic chemotherapy: Secondary | ICD-10-CM

## 2013-09-29 DIAGNOSIS — G620 Drug-induced polyneuropathy: Secondary | ICD-10-CM | POA: Insufficient documentation

## 2013-09-29 DIAGNOSIS — C8589 Other specified types of non-Hodgkin lymphoma, extranodal and solid organ sites: Secondary | ICD-10-CM

## 2013-09-29 DIAGNOSIS — D649 Anemia, unspecified: Secondary | ICD-10-CM

## 2013-09-29 DIAGNOSIS — T451X5A Adverse effect of antineoplastic and immunosuppressive drugs, initial encounter: Secondary | ICD-10-CM | POA: Insufficient documentation

## 2013-09-29 LAB — COMPREHENSIVE METABOLIC PANEL (CC13)
ALT: 20 U/L (ref 0–55)
AST: 20 U/L (ref 5–34)
Albumin: 3.7 g/dL (ref 3.5–5.0)
Alkaline Phosphatase: 60 U/L (ref 40–150)
Anion Gap: 9 mEq/L (ref 3–11)
BUN: 8.5 mg/dL (ref 7.0–26.0)
CO2: 23 mEq/L (ref 22–29)
Calcium: 9.5 mg/dL (ref 8.4–10.4)
Chloride: 113 mEq/L — ABNORMAL HIGH (ref 98–109)
Creatinine: 0.7 mg/dL (ref 0.6–1.1)
Glucose: 101 mg/dl (ref 70–140)
Potassium: 3.7 mEq/L (ref 3.5–5.1)
Sodium: 145 mEq/L (ref 136–145)
Total Bilirubin: 0.66 mg/dL (ref 0.20–1.20)
Total Protein: 6.6 g/dL (ref 6.4–8.3)

## 2013-09-29 LAB — LACTATE DEHYDROGENASE (CC13): LDH: 232 U/L (ref 125–245)

## 2013-09-29 LAB — CBC WITH DIFFERENTIAL/PLATELET
BASO%: 0.6 % (ref 0.0–2.0)
Basophils Absolute: 0 10*3/uL (ref 0.0–0.1)
EOS%: 0.4 % (ref 0.0–7.0)
Eosinophils Absolute: 0 10*3/uL (ref 0.0–0.5)
HCT: 28.7 % — ABNORMAL LOW (ref 34.8–46.6)
HGB: 8.8 g/dL — ABNORMAL LOW (ref 11.6–15.9)
LYMPH%: 26.2 % (ref 14.0–49.7)
MCH: 28.3 pg (ref 25.1–34.0)
MCHC: 30.7 g/dL — ABNORMAL LOW (ref 31.5–36.0)
MCV: 92.3 fL (ref 79.5–101.0)
MONO#: 0.4 10*3/uL (ref 0.1–0.9)
MONO%: 8.6 % (ref 0.0–14.0)
NEUT#: 3 10*3/uL (ref 1.5–6.5)
NEUT%: 64.2 % (ref 38.4–76.8)
Platelets: 318 10*3/uL (ref 145–400)
RBC: 3.11 10*6/uL — ABNORMAL LOW (ref 3.70–5.45)
RDW: 20.5 % — ABNORMAL HIGH (ref 11.2–14.5)
WBC: 4.7 10*3/uL (ref 3.9–10.3)
lymph#: 1.2 10*3/uL (ref 0.9–3.3)
nRBC: 1 % — ABNORMAL HIGH (ref 0–0)

## 2013-09-29 NOTE — Progress Notes (Signed)
[facility] OFFICE PROGRESS NOTE  [name],[name], MD [address] [place] [address]  DIAGNOSIS: Other malignant lymphomas, unspecified site, extranodal and solid organ sites - Plan: CBC with Differential, Comprehensive metabolic panel  Normocytic anemia  Neuropathy due to chemotherapeutic drug  Chief Complaint  Patient presents with  . Other malignant lymphomas, unspecified site, extranodal and    CURRENT THERAPY: CHOEP q 21 days started on 05/20/2013.  Completed 6 of 6 planned cycles for CHOEP on 09/05/2013.  Cycle #5 was complicated by ICU-level care secondary to severe hyperglycemia (see prior note for details).  Cycle #6, prednisone was excluded.  She received neulasta throughout her therapy.   INTERVAL HISTORY: Megan Mcknight 52 y.o. female anaplastic large cell lymphoma, CD30 and ALK positive, CD20 negative is here for follow-up. He was last seen by PA-C Conni Slipper on 09/05/2013. He completed her cycles #6 of 6 with CHOEP--Cytoxan, doxorubicin, etoposide and prednisone) in conjunction with Neulasta. Prednisone dose was withheld due to veer hypoglycemia following cycle #5 as noted previously. She reports of feeling well overall. She denies any nausea/vomiting/fever or chills/emergency room visits/hospitalizations. He reports continued out lateral hand tingling and numbness that is stable. She also endorses diarrhea which has improved with antidiarrheal medications including Imodium. She had a repeat PET scan done on 09/26/2013. He denies any chest pain, palpitations, worsening fatigue. Today she is accompanied by her husband.  She complains of having some remaining blurred vision likely secondary to the hyperglycemia. She plans to see an ophthalmologist regarding her vision. She has lost her hair. She only has some minimal dyspnea on exertion and is not using any oxygen. She denies any pain. She denied cough. She denied a mucositis or night sweats.  MEDICAL  HISTORY: Past Medical History  Diagnosis Date  . Hyperlipidemia   . Nerve pain     Left leg  . Seasonal allergies   . Acute bronchitis   . Colon polyps 02/2013    Per colonoscopy  . Diverticulosis 02/2013    Per colonoscopy  . Former light tobacco smoker   . Dysrhythmia   . Cancer     lung and kidney  . Anaplastic large cell lymphoma     INTERIM HISTORY: has GERD (gastroesophageal reflux disease); Shortness of breath; Atrial fibrillation with RVR; Bilateral pneumonia; Acute renal insufficiency; Normocytic anemia; Other malignant lymphomas, unspecified site, extranodal and solid organ sites; Tumor lysis syndrome; Hyperglycemia without ketosis; Acute renal failure; Acute encephalopathy; Hyperkalemia; and Neuropathy due to chemotherapeutic drug on her problem list.    ALLERGIES:  is allergic to iodine and shellfish-derived products.  MEDICATIONS: has a current medication list which includes the following prescription(s): albuterol, allopurinol, amlodipine, ibuprofen, insulin glargine, lansoprazole, lidocaine-prilocaine, metformin, metoprolol tartrate, montelukast, prochlorperazine, and rosuvastatin.  SURGICAL HISTORY:  Past Surgical History  Procedure Laterality Date  . Partial hysterectomy    . Bones removed from baby toes    . Colonoscopy  02/16/2012    Procedure: COLONOSCOPY;  Surgeon: Corbin Ade, MD;  Location: [facility];  Service: Endoscopy;  Laterality: N/A;  1:30  . Abdominal hysterectomy    . Video bronchoscopy with endobronchial ultrasound N/A 05/13/2013    Procedure: VIDEO BRONCHOSCOPY WITH ENDOBRONCHIAL ULTRASOUND;  Surgeon: Loreli Slot, MD;  Location: [facility] OR;  Service: Thoracic;  Laterality: N/A;  . Mediastinoscopy N/A 05/13/2013    Procedure: MEDIASTINOSCOPY;  Surgeon: Loreli Slot, MD;  Location: [facility] OR;  Service: Thoracic;  Laterality: N/A;   PROBLEM LIST:  1. Anaplastic large-cell lymphoma, CD 30 positive, ALK positive, CD 20, negative, high grade,  stage IV with involvement of mediastinal lymph nodes from which biopsy was obtained on 05/13/2013 by Viviann Spare C. Dorris Fetch, M.D. There is also involvement of the lungs with multiple lung nodules, liver and bilateral inguinal lymph nodes,  the latter seen on PET scan from 05/30/2013. Bone marrow on 05/18/2013 was negative. IPI score was 2. Histologic diagnosis was made on 05/13/2013 by mediastinal lymph node biopsy. Chemotherapy with Cytoxan, Adriamycin, vincristine, etoposide and prednisone in conjunction with Neulasta was initiated on 05/20/2013.  2. Brief episode of atrial fibrillation with rapid ventricular  response in late May 2014 at time of lymphoma presentation.  3. Dyslipidemia.  4. Seasonal allergies.  5. Diverticulosis.  6. Status post hysterectomy.  7. Right-sided Port-A-Cath placed on 05/23/2013.  8. Hyperglycemia/Diabetes Mellitus following cycle #5.   ONCOLOGY TIMELINE: *05/10/2013.  The patient was found to have extensive pulmonary nodules on a chest CT scan.  *05/13/2013. Video bronchoscopy with endobronchial ultrasound and mediastinoscopy by Salvatore Decent. Hendrickson,  mediastinal biopsies showed anaplastic large-cell lymphoma, CD 30 and ALK positive. CD 20 negative. Tumor was high-grade stage IV with an IPI score of 2.   *05/18/2013. Bone marrow aspirate and biopsy on  was negative for lymphoma.   *05/20/2013.Chemotherapy with CHOEP regimen was started with slightly decreased dose due to profound bone marrow suppression.     *05/23/2013. Port-A-Cath placed via interventional radiology on the right side on 05/23/2013.   *05/30/2013.  PET scan following cycle #1 shows only uptake in bilateral inguinal lymph nodes that are not really significantly enlarged. The patient has had a striking regression of tumor in her lungs, at least 90%. There has also been a decrease in the liver masses. None of those areas showed increased uptake, interestingly enough.   REVIEW OF SYSTEMS:    Constitutional: Denies fevers, chills or abnormal weight loss Eyes: Denies blurriness of vision Ears, nose, mouth, throat, and face: Denies mucositis or sore throat Respiratory: Denies cough, dyspnea or wheezes Cardiovascular: Denies palpitation, chest discomfort or lower extremity swelling Gastrointestinal:  Denies nausea, heartburn or change in bowel habits Skin: Denies abnormal skin rashes Lymphatics: Denies new lymphadenopathy or easy bruising Neurological:Denies numbness, tingling or new weaknesses Behavioral/Psych: Mood is stable, no new changes  All other systems were reviewed with the patient and are negative.  PHYSICAL EXAMINATION: ECOG PERFORMANCE STATUS: 0 - Asymptomatic  Blood pressure 127/83, pulse 92, temperature 97.7 F (36.5 C), resp. rate 18, height 5\' 8"  (1.727 m), weight 210 lb 1.6 oz (95.301 kg), SpO2 100.00%.  GENERAL:alert, no distress and comfortable; Alopecia SKIN: skin color, texture, turgor are normal, no rashes or significant lesions; R port a cath in place.   EYES: normal, Conjunctiva are pink and non-injected, sclera clear OROPHARYNX:no exudate, no erythema and lips, buccal mucosa, and tongue normal  NECK: supple, thyroid normal size, non-tender, without nodularity LYMPH:  no palpable lymphadenopathy in the cervical, axillary or supraclavicular LUNGS: clear to auscultation and percussion with normal breathing effort HEART: regular rate & rhythm and no murmurs and no lower extremity edema ABDOMEN:abdomen soft, non-tender and normal bowel sounds Musculoskeletal:no cyanosis of digits and no clubbing; darkening of both hands bilaterally with some dry skin.    NEURO: alert & oriented x 3 with fluent speech, no focal motor/sensory deficits   LABORATORY DATA: Results for orders placed in visit on 09/29/13 (from the past 48 hour(s))  LACTATE DEHYDROGENASE (CC13)     Status: None  Collection Time    09/29/13  9:52 AM      Result Value Range   LDH 232  125 -  245 U/L  CBC WITH DIFFERENTIAL     Status: Abnormal   Collection Time    09/29/13  9:52 AM      Result Value Range   WBC 4.7  3.9 - 10.3 10e3/uL   NEUT# 3.0  1.5 - 6.5 10e3/uL   HGB 8.8 (*) 11.6 - 15.9 g/dL   HCT 21.3 (*) 08.6 - 57.8 %   Platelets 318  145 - 400 10e3/uL   MCV 92.3  79.5 - 101.0 fL   MCH 28.3  25.1 - 34.0 pg   MCHC 30.7 (*) 31.5 - 36.0 g/dL   RBC 4.69 (*) 6.29 - 5.28 10e6/uL   RDW 20.5 (*) 11.2 - 14.5 %   lymph# 1.2  0.9 - 3.3 10e3/uL   MONO# 0.4  0.1 - 0.9 10e3/uL   Eosinophils Absolute 0.0  0.0 - 0.5 10e3/uL   Basophils Absolute 0.0  0.0 - 0.1 10e3/uL   NEUT% 64.2  38.4 - 76.8 %   LYMPH% 26.2  14.0 - 49.7 %   MONO% 8.6  0.0 - 14.0 %   EOS% 0.4  0.0 - 7.0 %   BASO% 0.6  0.0 - 2.0 %   nRBC 1 (*) 0 - 0 %  COMPREHENSIVE METABOLIC PANEL (CC13)     Status: Abnormal   Collection Time    09/29/13  9:52 AM      Result Value Range   Sodium 145  136 - 145 mEq/L   Potassium 3.7  3.5 - 5.1 mEq/L   Chloride 113 (*) 98 - 109 mEq/L   CO2 23  22 - 29 mEq/L   Glucose 101  70 - 140 mg/dl   BUN 8.5  7.0 - 41.3 mg/dL   Creatinine 0.7  0.6 - 1.1 mg/dL   Total Bilirubin 2.44  0.20 - 1.20 mg/dL   Alkaline Phosphatase 60  40 - 150 U/L   AST 20  5 - 34 U/L   ALT 20  0 - 55 U/L   Total Protein 6.6  6.4 - 8.3 g/dL   Albumin 3.7  3.5 - 5.0 g/dL   Calcium 9.5  8.4 - 01.0 mg/dL   Anion Gap 9  3 - 11 mEq/L    Labs:  Lab Results  Component Value Date   WBC 4.7 09/29/2013   HGB 8.8* 09/29/2013   HCT 28.7* 09/29/2013   MCV 92.3 09/29/2013   PLT 318 09/29/2013   NEUTROABS 3.0 09/29/2013      Chemistry      Component Value Date/Time   NA 145 09/29/2013 0952   NA 142 08/29/2013 0530   K 3.7 09/29/2013 0952   K 3.6 08/29/2013 0530   CL 111 08/29/2013 0530   CO2 23 09/29/2013 0952   CO2 24 08/29/2013 0530   BUN 8.5 09/29/2013 0952   BUN 6 08/29/2013 0530   CREATININE 0.7 09/29/2013 0952   CREATININE 0.76 08/29/2013 0530      Component Value Date/Time   CALCIUM 9.5  09/29/2013 0952   CALCIUM 9.0 08/29/2013 0530   ALKPHOS 60 09/29/2013 0952   ALKPHOS 96 05/24/2013 0510   AST 20 09/29/2013 0952   AST 25 05/24/2013 0510   ALT 20 09/29/2013 0952   ALT 75* 05/24/2013 0510   BILITOT 0.66 09/29/2013 0952   BILITOT 0.3 05/24/2013 0510  Basic Metabolic Panel:  Recent Labs Lab 09/29/13 0952  NA 145  K 3.7  CO2 23  GLUCOSE 101  BUN 8.5  CREATININE 0.7  CALCIUM 9.5   GFR Estimated Creatinine Clearance: 100.5 ml/min (by C-G formula based on Cr of 0.7). Liver Function Tests:  Recent Labs Lab 09/29/13 0952  AST 20  ALT 20  ALKPHOS 60  BILITOT 0.66  PROT 6.6  ALBUMIN 3.7   CBC:  Recent Labs Lab 09/23/13 1054 09/29/13 0952  WBC 4.6 4.7  NEUTROABS 2.6 3.0  HGB 7.3* 8.8*  HCT 21.0* 28.7*  MCV 101.0* 92.3  PLT 226 318    Recent Labs Lab 09/26/13 1002  GLUCAP 53*    Studies:  No results found.   RADIOGRAPHIC STUDIES:  (This study was personally reviewed by me) Nm Pet Image Restag (ps) Skull Base To Thigh  09/26/2013   CLINICAL DATA:  Subsequent treatment strategy for lymphoma.  EXAM: NUCLEAR MEDICINE PET SKULL BASE TO THIGH  FASTING BLOOD GLUCOSE:  Value:  58 mg/dl  TECHNIQUE: 16.1 mCi W-96 FDG was injected intravenously. CT data was obtained and used for attenuation correction and anatomic localization only. (This was not acquired as a diagnostic CT examination.) Additional exam technical data entered on technologist worksheet.  COMPARISON:  05/30/2013 and CT chest 05/10/2013.  FINDINGS: NECK  No hypermetabolic lymph nodes in the neck. CT images show no acute findings.  CHEST  A 9 x 11 mm soft tissue nodule in the lateral left breast has an SUV max of 2.2. Skin thickening in the left axillary region has an SUV max of 3.7.  Mid and distal portion of the esophagus appear hypermetabolic, with SUV max of 12.6.  Confluent soft tissue in the infrahilar aspect of the superior segment right lower lobe measures approximately 1.4 x 2.1 cm with  an SUV max of 4.2. Associated volume loss in the right lower lobe is hypermetabolic as well.  CT images show no acute findings. No pericardial or pleural effusion.  ABDOMEN/PELVIS  Mildly hypermetabolic external iliac and inguinal lymph nodes measure up to 10 mm on the left, with an SUV max of 4.6. No additional areas of abnormal hypermetabolism in the abdomen or pelvis. CT images show no acute findings.  SKELETON  No focal hypermetabolic activity to suggest skeletal metastasis.  IMPRESSION: 1. Mildly hypermetabolic bilateral external iliac and inguinal lymph nodes, as before. 2. Small area of residual hypermetabolic confluent soft tissue in the infrahilar right lower lobe with associated volume loss in the right lower lobe. Overall improved appearance of metastatic disease in the lungs. 3. Mildly hypermetabolic skin thickening in the left axillary region with a mildly hypermetabolic soft tissue nodule in the lateral left breast. Please correlate clinically with physical exam and mammogram, as clinically indicated. 4. Hypermetabolism involving the mid and distal portions of the esophagus. Findings are new from 05/30/2013 and may relate to esophagitis   Electronically Signed   By: Leanna Battles M.D.   On: 09/26/2013 14:34   ASSESSMENT: Laurier Nancy 52 y.o. female with a history of Other malignant lymphomas, unspecified site, extranodal and solid organ sites - Plan: CBC with Differential, Comprehensive metabolic panel  Normocytic anemia  Neuropathy due to chemotherapeutic drug  PLAN:  1. Stage IV Anaplastic Large-cell lymphoma (ALCL) CD30+, ALK+, CD20(-). --We reviewed in depth her PET Scan done on 09/26/2013 following 6 cycles of CHOEP (Absent Prednisone for cycle #6). It is as summarized as follows: 1.Mildly hypermetabolic bilateral external iliac and inguinal  lymph nodes, as before. 2. Small area of residual hypermetabolic confluent soft tissue in the infrahilar right lower lobe with associated volume  loss in the right lower lobe. Overall improved appearance of metastatic disease in the lungs. 3. Mildly hypermetabolic skin thickening in the left axillary region with a mildly hypermetabolic soft tissue nodule in the lateral left breast. 4. Hypermetabolism involving the mid and distal portions of the esophagus.  The above findings does not suggest a complete response to her therapy. In addition, given the findings of her esophagus (although this could be esophagitis) it could also represent progressive disease or partial response.  So I recommended referral to [facility] for consideration of stem cell transplant; 2) biopsy to exclude development of B-cell lymphoma; 3) Brentuximab vedotin for CD30 + based on NCCN category 2b recommendations versus ICE (ifosfamide, carboplatin, etoposide) as possible second line therapy. If relapsed disease, we will obtain a repeat bone marrow biopsy as well.  She has an appointment with Dr. Alphonsa Gin on 10/05/2013 at 11 am.   We will await official recommendations from Dr. Alphonsa Gin prior to proceeding.   2. Anemia secondary to chemotherapy. --Her counts including her WBC, Plts have recovered.  She does not have any symptoms of anemia presently.    3. Neuropathy secondary to chemotherapy.  --Tingling in hands is stable.  She can button her shirts without difficulty.   4. 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 --Patient will follow up with opthalmology for further evaluation and management.   5. Follow-up. --Patient should follow up in 2 weeks with CMP, CBC.    All questions were answered. The patient knows to call the clinic with any problems, questions or concerns. We can certainly see the patient much sooner if necessary.  I spent 25 minutes counseling the patient face to face. The total time spent in the appointment was 40 minutes.    Wilson Sample, MD 09/29/2013 2:48 PM

## 2013-09-29 NOTE — Patient Instructions (Signed)
1. PET with partial response. 2. Consider 2nd line therapy.  May require biopsy prior. 3. Determine if candidate for transplant.

## 2013-09-29 NOTE — Telephone Encounter (Signed)
Gave pt appt for lab and MD for 10/14/13

## 2013-09-30 ENCOUNTER — Telehealth: Payer: Self-pay

## 2013-09-30 NOTE — Telephone Encounter (Signed)
S/w pt her cbg was 53 on 10/13. She stated she had taken her metformin and insulin and it brought it down. She does check her sugars regularly. She eats regularly as well.

## 2013-10-06 ENCOUNTER — Other Ambulatory Visit: Payer: Self-pay | Admitting: Internal Medicine

## 2013-10-11 ENCOUNTER — Telehealth: Payer: Self-pay | Admitting: Internal Medicine

## 2013-10-11 ENCOUNTER — Other Ambulatory Visit: Payer: Self-pay | Admitting: Internal Medicine

## 2013-10-11 DIAGNOSIS — C8589 Other specified types of non-Hodgkin lymphoma, extranodal and solid organ sites: Secondary | ICD-10-CM

## 2013-10-11 NOTE — Telephone Encounter (Signed)
I discussed the plans with Dr. Alphonsa Gin of [facility].  They reviewed her recent scan and recommends to biopsy breast mass and one of the lymph nodes of the inguinal area (one that is most PET avid).  Plans will be according to one of the 3 options: 1) If Alk positive in both biopsy, perform 2-3 cycles of ICE or 2 cycles of Brentuximab( preferred by [facility]) or if 2) biopsy of the breast reveals a primary breast oncology, she will require a referral to breast oncology and see if treatment for primary breast could be delayed or 3) if node negative for both ALK lymphoma, proceed directly to BMT.  Her office will contact Megan Mcknight to make her aware.

## 2013-10-12 ENCOUNTER — Other Ambulatory Visit: Payer: Self-pay | Admitting: Medical Oncology

## 2013-10-13 ENCOUNTER — Other Ambulatory Visit: Payer: Self-pay | Admitting: Internal Medicine

## 2013-10-13 DIAGNOSIS — C8589 Other specified types of non-Hodgkin lymphoma, extranodal and solid organ sites: Secondary | ICD-10-CM

## 2013-10-14 ENCOUNTER — Ambulatory Visit (HOSPITAL_BASED_OUTPATIENT_CLINIC_OR_DEPARTMENT_OTHER): Payer: PRIVATE HEALTH INSURANCE | Admitting: Internal Medicine

## 2013-10-14 ENCOUNTER — Telehealth: Payer: Self-pay | Admitting: Internal Medicine

## 2013-10-14 ENCOUNTER — Other Ambulatory Visit (HOSPITAL_BASED_OUTPATIENT_CLINIC_OR_DEPARTMENT_OTHER): Payer: PRIVATE HEALTH INSURANCE | Admitting: Lab

## 2013-10-14 VITALS — BP 119/78 | HR 80 | Temp 97.9°F | Resp 20 | Ht 68.0 in | Wt 210.9 lb

## 2013-10-14 DIAGNOSIS — G620 Drug-induced polyneuropathy: Secondary | ICD-10-CM

## 2013-10-14 DIAGNOSIS — D649 Anemia, unspecified: Secondary | ICD-10-CM

## 2013-10-14 DIAGNOSIS — IMO0002 Reserved for concepts with insufficient information to code with codable children: Secondary | ICD-10-CM

## 2013-10-14 DIAGNOSIS — C8589 Other specified types of non-Hodgkin lymphoma, extranodal and solid organ sites: Secondary | ICD-10-CM

## 2013-10-14 DIAGNOSIS — N179 Acute kidney failure, unspecified: Secondary | ICD-10-CM

## 2013-10-14 DIAGNOSIS — D6481 Anemia due to antineoplastic chemotherapy: Secondary | ICD-10-CM

## 2013-10-14 DIAGNOSIS — T451X5A Adverse effect of antineoplastic and immunosuppressive drugs, initial encounter: Secondary | ICD-10-CM

## 2013-10-14 LAB — CBC WITH DIFFERENTIAL/PLATELET
BASO%: 1.7 % (ref 0.0–2.0)
Basophils Absolute: 0.1 10*3/uL (ref 0.0–0.1)
EOS%: 4.5 % (ref 0.0–7.0)
Eosinophils Absolute: 0.2 10*3/uL (ref 0.0–0.5)
HCT: 33.6 % — ABNORMAL LOW (ref 34.8–46.6)
HGB: 10.6 g/dL — ABNORMAL LOW (ref 11.6–15.9)
LYMPH%: 26.2 % (ref 14.0–49.7)
MCH: 27.6 pg (ref 25.1–34.0)
MCHC: 31.7 g/dL (ref 31.5–36.0)
MCV: 87.1 fL (ref 79.5–101.0)
MONO#: 0.3 10*3/uL (ref 0.1–0.9)
MONO%: 6.8 % (ref 0.0–14.0)
NEUT#: 2.7 10*3/uL (ref 1.5–6.5)
NEUT%: 60.8 % (ref 38.4–76.8)
Platelets: 220 10*3/uL (ref 145–400)
RBC: 3.85 10*6/uL (ref 3.70–5.45)
RDW: 17.2 % — ABNORMAL HIGH (ref 11.2–14.5)
WBC: 4.5 10*3/uL (ref 3.9–10.3)
lymph#: 1.2 10*3/uL (ref 0.9–3.3)

## 2013-10-14 LAB — COMPREHENSIVE METABOLIC PANEL (CC13)
ALT: 14 U/L (ref 0–55)
AST: 15 U/L (ref 5–34)
Albumin: 3.9 g/dL (ref 3.5–5.0)
Alkaline Phosphatase: 65 U/L (ref 40–150)
Anion Gap: 10 mEq/L (ref 3–11)
BUN: 7.1 mg/dL (ref 7.0–26.0)
CO2: 22 mEq/L (ref 22–29)
Calcium: 9.9 mg/dL (ref 8.4–10.4)
Chloride: 113 mEq/L — ABNORMAL HIGH (ref 98–109)
Creatinine: 0.8 mg/dL (ref 0.6–1.1)
Glucose: 99 mg/dl (ref 70–140)
Potassium: 3.7 mEq/L (ref 3.5–5.1)
Sodium: 145 mEq/L (ref 136–145)
Total Bilirubin: 0.42 mg/dL (ref 0.20–1.20)
Total Protein: 6.9 g/dL (ref 6.4–8.3)

## 2013-10-14 NOTE — Patient Instructions (Signed)
Bone Marrow Transplantation and Peripheral Blood Stem Cell Transplantation, Questions and Answers  WHAT ARE BONE MARROW TRANSPLANTATION AND PERIPHERAL BLOOD STEM CELL TRANSPLANTATION?   Bone marrow transplantation (BMT) and peripheral blood stem cell transplantation (PBSCT) are procedures that restore stem cells that have been destroyed by high doses of chemotherapy or radiation therapy.  There are 3 types of transplants:  Patients receive their own stem cells (autologous transplants).  Patients receive stem cells from their identical twin (syngeneic transplants).  Patients receive stem cells from someone other than themselves or an identical twin. The patient's brother, sister, parent, or a person not related to the patient may be used (allogeneic transplants). KEY POINTS   In general, patients are less likely to develop a complication known as graft-versus-host disease (GVHD) if the stem cells of the donor and patient are closely matched.  After being treated with high-dose anticancer drugs or radiation, the patient receives the harvested stem cells. They travel to the bone marrow and begin to produce new blood cells.  A "mini-transplant" uses lower, less toxic doses of chemotherapy or radiation to prepare the patient for transplant.  A "tandem transplant" involves 2 sequential courses of high-dose chemotherapy and stem cell transplant.  The National Marrow Donor Program (NMDP) maintains an international registry of volunteer stem cell donors. WHAT ARE BONE MARROW AND HEMATOPOIETIC STEM CELLS?   Bone marrow is the soft, sponge-like material found inside bones. Bone marrow contains a specific kind of cell that creates blood-forming stem cells (hematopoietic). Hematopoietic stem cells divide to form more blood-forming stem cells, or they mature into 1 of 3 types of blood cells:  White blood cells, which fight infection.  Red blood cells, which carry oxygen.  Platelets, which help the  blood to clot.  Most hematopoietic stem cells are found in the bone marrow. Some cells, called peripheral blood stem cells (PBSCs), are found in the bloodstream. Blood in the umbilical cord also contains hematopoietic stem cells. Cells from any of these sources can be used in transplants. WHY ARE BMT AND PBSCT USED IN CANCER TREATMENT?  One reason BMT and PBSCT are used in cancer treatment is to make it possible for patients to receive very high doses of chemotherapy or radiation therapy. To understand more about why BMT and PBSCT are used, it is helpful to understand how chemotherapy and radiation therapy work.   Chemotherapy and radiation therapy generally affect cells that divide rapidly. They are used to treat cancer because cancer cells divide more often than most healthy cells. However, because bone marrow cells also divide frequently, high-dose treatments can severely damage or destroy the patient's bone marrow. Without healthy bone marrow, the patient is no longer able to make the blood cells needed to:  Carry oxygen.  Fight infection.  Prevent bleeding. BMT and PBSCT replace stem cells that were destroyed by treatment. The healthy, transplanted stem cells can restore the bone marrow's ability to produce the blood cells the patient needs.  In some types of leukemia, the graft-versus-tumor (GVT) effect that occurs after allogeneic BMT and PBSCT is crucial to the effectiveness of the treatment. GVT occurs when white blood cells from the donor identify the cancer cells that remain in the patient's body after the chemotherapy or radiation therapy (the tumor) as foreign. They then attack them.  WHAT TYPES OF CANCER USE BMT AND PBSCT?  BMT and PBSCT are most commonly used in the treatment of leukemia and lymphoma. They are most effective when the leukemia or lymphoma is  in remission. This means that the signs and symptoms of cancer have disappeared. BMT and PBSCT are also used to treat other  cancers such as:  Cancer that arises in immature nerve cells and affects mostly infants and children (neuroblastoma).  Cancer of the plasma cells (multiple myeloma). Researchers are evaluating BMT and PBSCT for the treatment of various types of cancer. HOW ARE THE DONOR'S STEM CELLS MATCHED TO THE PATIENT'S STEM CELLS IN ALLOGENEIC OR SYNGENEIC TRANSPLANTATION?   To minimize potential side effects, caregivers most often use transplanted stem cells that match the patient's own stem cells as closely as possible. People have different sets of proteins on the surface of their cells. They are called human leukocyte-associated (HLA) antigens. The set of proteins, called the HLA type, is identified by a blood test.  In most cases, the success of allogeneic transplantation depends in part on how well the HLA antigens of the donor's stem cells match those of the recipient's stem cells. The higher the number of matching HLA antigens, the greater the chance that the patient's body will accept the donor's stem cells. In general, patients are less likely to develop the complication known as GVHD if the stem cells of the donor and patient are closely matched.  Close relatives, especially brothers and sisters, are more likely than unrelated people to be HLA-matched. But only 25% to 35% of patients have an HLA-matched sibling. The chances of obtaining HLA-matched stem cells from an unrelated donor are slightly better. It is approximately 50%. Among unrelated donors, HLA-matching is greatly improved when the donor and recipient have the same ethnic and racial background. The number of donors is increasing. However, individuals from certain ethnic and racial groups still have a lower chance of finding a matching donor. Large volunteer donor registries can help in finding an appropriate, unrelated donor.  Identical twins have the same genes, so they have the same set of HLA antigens. As a result, the patient's body will  accept a transplant from an identical twin. However, identical twins represent a small number of all births, so syngeneic transplantation is rare. HOW IS BONE MARROW OBTAINED FOR TRANSPLANTATION?  The stem cells used in BMT come from the liquid center of the bone. This is called the marrow. In general, the procedure for obtaining bone marrow, which is called harvesting, is similar for all 3 types of BMTs (autologous, syngeneic, and allogeneic). The donor is given either general or local anesthetic. General anesthetic puts the person to sleep during the procedure. Local anesthetic causes loss of feeling below the waist. Needles are inserted through the skin over the hip (pelvic) bone, or in rare cases, the breastbone (sternum). Then the needles go into the bone marrow to draw the marrow out of the bone. Harvesting the marrow takes about 1 hour. The harvested bone marrow is then processed to remove blood and bone fragments. Harvested bone marrow can be combined with a preservative and frozen to keep the stem cells alive until they are needed. This technique is known as cryopreservation. Stem cells can be preserved this way for many years.  HOW ARE PBSCS OBTAINED FOR TRANSPLANTATION?  The stem cells used in PBSCT come from the bloodstream. A process called apheresis is used to obtain PBSCs for transplantation. For 4 or 5 days before apheresis, the donor may be given a medicine to increase the number of stem cells released into the bloodstream. In apheresis, blood is removed through a large vein in the arm or a central  venous catheter. This is a flexible tube that is placed in a large vein in the neck, chest, or groin area. The blood goes through a machine that removes the stem cells. The blood is then returned to the donor. The collected cells are then stored. Apheresis typically takes 4 to 6 hours. The stem cells are then frozen until they are given to the recipient. Apheresis may also be called  leukapheresis. HOW ARE UMBILICAL CORD STEM CELLS OBTAINED FOR TRANSPLANTATION?   Stem cells also may be retrieved from umbilical cord blood. For this to occur, the mother must contact a cord blood bank before the baby's birth. The cord blood bank may request that she complete a questionnaire and give a small blood sample.  Cord blood banks may be public or commercial. Public cord blood banks accept donations of cord blood. They may provide the donated stem cells to another matched individual in their network. In contrast, commercial cord blood banks will store the cord blood for the family. This is in case it is needed later for the child or another family member.  After the baby is born and the umbilical cord has been cut, blood is retrieved from the umbilical cord and placenta. This process does not cause health risks to the mother or the child. If the mother agrees, the umbilical cord blood is processed and frozen for storage by the cord blood bank. Only a small amount of blood can be retrieved from the umbilical cord and placenta, so the collected stem cells are typically used for children or small adults. ARE ANY RISKS ASSOCIATED WITH DONATING BONE MARROW?   Only a small amount of bone marrow is removed. Donating usually does not pose any significant problems for the donor. The most serious risk involves the use of anesthesia during the procedure.  The area where the bone marrow was taken out may feel stiff or sore for a few days. The donor may feel tired. Within a few weeks, the donor's body replaces the donated marrow. The time required for a donor to recover varies. Some people are back to their usual routine within 2 or 3 days. Others may take up to 3 to 4 weeks to fully recover their strength. ARE ANY RISKS ASSOCIATED WITH DONATING PBSCS?  Apheresis usually causes little discomfort. During apheresis, the patient may feel:  Lightheaded.  Chills.  Numbness around the lips.  Cramping in  the hands. Unlike bone marrow donation, PBSC donation does not require anesthesia. The medicine that is given to stimulate the release of stem cells from the marrow into the bloodstream may cause:  Bone and muscle aches.  Headaches.  Fatigue.  Nausea.  Vomiting.  Difficulty sleeping. These side effects generally stop within 2 to 3 days of the last dose of the medicine.  HOW DOES THE PATIENT RECEIVE THE STEM CELLS DURING THE TRANSPLANT?  After being treated with high-dose anticancer drugs or radiation, the patient receives the stem cells through an intravenous line (IV). This part of the transplant takes 1 to 5 hours.  ARE ANY SPECIAL MEASURES TAKEN WHEN THE CANCER PATIENT IS ALSO THE DONOR (AUTOLOGOUS TRANSPLANT)?  The stem cells used for autologous transplantation must be relatively free of cancer cells. The harvested cells can sometimes be treated before transplantation. A process known as purging is used to get rid of cancer cells. This process can remove some cancer cells from the harvested cells and minimize the chance that cancer will come back. Purging may damage some  healthy stem cells, so more cells are obtained from the patient before the transplant. This is to ensure that enough healthy stem cells will remain after purging.  WHAT HAPPENS AFTER THE STEM CELLS HAVE BEEN TRANSPLANTED TO THE PATIENT?  After entering the bloodstream, the stem cells travel to the bone marrow. There they begin to produce new white blood cells, red blood cells, and platelets in a process known as engraftment. Engraftment usually occurs within about 2 to 4 weeks after transplantation. Your caregivers monitor it by checking blood counts on a frequent basis. Complete recovery of immune function takes much longer. It can take up to several months for autologous transplant recipients and 1 to 2 years for patients receiving allogeneic or syngeneic transplants. Caregivers evaluate the results of various blood tests  to confirm that new blood cells are being produced and that the cancer has not returned. The removal of a small sample of bone marrow through a needle for examination under a microscope (bone marrow aspiration) can also help caregivers determine how well the new marrow is working.  WHAT ARE THE POSSIBLE SIDE EFFECTS OF BMT AND PBSCT?   The major risk of both treatments is an increased susceptibility to infection and bleeding as a result of the high-dose cancer treatment. Caregivers may give the patient antibiotics to prevent or treat infection. They may also give the patient transfusions of platelets to prevent bleeding and red blood cells to treat anemia. Patients who undergo BMT and PBSCT may experience short-term side effects. These include:  Nausea.  Vomiting.  Fatigue.  Loss of appetite.  Mouth sores.  Hair loss.  Skin reactions.  Potential long-term risks include complications of the pre-transplant chemotherapy and radiation therapy, such as:  Inability to produce children (infertility).  Clouding of the lens of the eye (cataracts). This causes loss of vision.  New (secondary) cancers.  Damage to the liver, kidneys, lungs, or heart.  With allogeneic transplants, the complication known as GVHD sometimes develops. GVHD occurs when white blood cells from the donor identify cells in the patient's body as foreign and attack them. The most commonly damaged organs are the skin, liver, and intestines. This complication can develop within a few weeks of the transplant (acute GVHD) or much later (chronic GVHD). To prevent this complication, the patient may receive medicines that suppress the immune system. Also, the donated stem cells can be treated to remove the white blood cells that cause GVHD. This is a process called T-cell depletion. If GVHD develops, it can be very serious. It is treated with steroids or other immunosuppressive agents. GVHD can be difficult to treat. However, some  studies suggest that patients with leukemia who develop GVHD are less likely to have the cancer come back. Clinical trials are being conducted to find ways to prevent and treat GVHD.  The likelihood and severity of complications are specific to the patient's treatment. They should be discussed with your caregiver. WHAT IS A "MINI-TRANSPLANT"?   A "mini-transplant" is also called a non-myeloablative or reduced-intensity transplant. It is a type of allogeneic transplant. This approach is being studied in clinical trials for the treatment of several types of cancer. These include:  Leukemia.  Lymphoma.  Multiple myeloma.  Other cancers of the blood.  A mini-transplant uses lower, less toxic doses of chemotherapy or radiation to prepare the patient for an allogeneic transplant. The use of lower doses of anticancer drugs and radiation eliminates some, but not all, of the patient's bone marrow. It also  reduces the number of cancer cells. It also suppresses the patient's immune system to prevent rejection of the transplant.  Unlike traditional BMT or PBSCT, cells from both the donor and the patient may exist in the patient's body for some time after a mini-transplant. Once the cells from the donor begin to engraft, they may cause the GVT effect and work to destroy the cancer cells that were not eliminated by the anticancer drugs or radiation. To boost the GVT effect, the patient may be given an injection of the donor's white blood cells. This procedure is called a donor lymphocyte infusion. WHAT IS A "TANDEM TRANSPLANT"?  A "tandem transplant" is a type of autologous transplant. This method is being studied in clinical trials for the treatment of several types of cancer, including multiple myeloma and germ cell cancer. During a tandem transplant, a patient receives 2 courses of high-dose chemotherapy, one after another (sequential), with stem cell transplant. Typically, the 2 courses are given several  weeks to several months apart. Researchers hope that this method can prevent the cancer from coming back at a later time.  FOR MORE INFORMATION  National Marrow Donor Program: www.marrow.org The NMDP is a [employer]. It was created to improve the effectiveness of the search for donors. The NMDP maintains an international registry of volunteers willing to be donors for all sources of blood stem cells used in transplantation:  Bone marrow.  Peripheral blood.  Umbilical cord blood. The NMDP website contains a list of participating transplant centers. The list includes descriptions of the centers as well as their transplant experience, survival statistics, research interests, pre-transplant costs, and contact information.  Document Released: 08/12/2004 Document Revised: 02/23/2012 Document Reviewed: 11/27/2008 [facility] Patient Information 2014 [place], [place]. Ifosfamide injection What is this medicine? IFOSFAMIDE (eye FOS fa mide) is a chemotherapy drug. It slows the growth of cancer cells. This medicine is used to treat testicular cancer. This medicine may be used for other purposes; ask your health care provider or pharmacist if you have questions. What should I tell my health care provider before I take this medicine? They need to know if you have any of these conditions: -bladder problems -blood disorders -dehydration -infection (especially a virus infection such as chickenpox, cold sores, or herpes) -kidney disease -liver disease -recent or ongoing radiation therapy -an unusual or allergic reaction to ifosfamide, other chemotherapy, other medicines, foods, dyes, or preservatives -pregnant or trying to get pregnant -breast-feeding How should I use this medicine? This drug is given as an infusion into a vein. It is administered in a hospital or clinic by a specially trained health care professional. Talk to your pediatrician regarding the use of this  medicine in children. Special care may be needed. Overdosage: If you think you have taken too much of this medicine contact a poison control center or emergency room at once. NOTE: This medicine is only for you. Do not share this medicine with others. What if I miss a dose? It is important not to miss your dose. Call your doctor or health care professional if you are unable to keep an appointment. What may interact with this medicine? Do not take this medicine with any of the following medications: -nalidixic acid This medicine may also interact with the following medications: -medicines to increase blood counts like filgrastim, pegfilgrastim, sargramostim -St. John's Wort -vaccines Talk to your doctor or health care professional before taking any of these medicines: -acetaminophen -aspirin -ibuprofen -ketoprofen -naproxen This list may not describe  all possible interactions. Give your health care provider a list of all the medicines, herbs, non-prescription drugs, or dietary supplements you use. Also tell them if you smoke, drink alcohol, or use illegal drugs. Some items may interact with your medicine. What should I watch for while using this medicine? Visit your doctor for checks on your progress. This drug may make you feel generally unwell. This is not uncommon, as chemotherapy can affect healthy cells as well as cancer cells. Report any side effects. Continue your course of treatment even though you feel ill unless your doctor tells you to stop. Drink water or other fluids as directed. Urinate often, even at night. In some cases, you may be given additional medicines to help with side effects. Follow all directions for their use. Call your doctor or health care professional for advice if you get a fever, chills or sore throat, or other symptoms of a cold or flu. Do not treat yourself. This drug decreases your body's ability to fight infections. Try to avoid being around people who are  sick. This medicine may increase your risk to bruise or bleed. Call your doctor or health care professional if you notice any unusual bleeding. Be careful brushing and flossing your teeth or using a toothpick because you may get an infection or bleed more easily. If you have any dental work done, tell your dentist you are receiving this medicine. Avoid taking products that contain aspirin, acetaminophen, ibuprofen, naproxen, or ketoprofen unless instructed by your doctor. These medicines may hide a fever. Do not become pregnant while taking this medicine. Women should inform their doctor if they wish to become pregnant or think they might be pregnant. There is a potential for serious side effects to an unborn child. Talk to your health care professional or pharmacist for more information. Do not breast-feed an infant while taking this medicine. What side effects may I notice from receiving this medicine? Side effects that you should report to your doctor or health care professional as soon as possible: -allergic reactions like skin rash, itching or hives, swelling of the face, lips, or tongue -low blood counts - this medicine may decrease the number of white blood cells, red blood cells and platelets. You may be at increased risk for infections and bleeding. -signs of infection - fever or chills, cough, sore throat, pain or difficulty passing urine -signs of decreased platelets or bleeding - bruising, pinpoint red spots on the skin, black, tarry stools, blood in the urine -signs of decreased red blood cells - unusually weak or tired, fainting spells, lightheadedness -agitation -breathing problems -confusion -dark urine -hallucinations -mouth sores -pain, swelling, redness at site where injected -seizures -trouble passing urine or change in the amount of urine -yellowing of the eyes or skin Side effects that usually do not require medical attention (report to your doctor or health care  professional if they continue or are bothersome): -diarrhea -hair loss -loss of appetite -nausea, vomiting This list may not describe all possible side effects. Call your doctor for medical advice about side effects. You may report side effects to FDA at 1-800-FDA-1088. Where should I keep my medicine? This drug is given in a hospital or clinic and will not be stored at home. NOTE: This sheet is a summary. It may not cover all possible information. If you have questions about this medicine, talk to your doctor, pharmacist, or health care provider.  2012, [employer]. (04/18/2008 4:23:05 PM)  Etoposide, VP-16 injection What is this medicine?  ETOPOSIDE, VP-16 (e toe POE side) is a chemotherapy drug. It is used to treat testicular cancer, lung cancer, and other cancers. This medicine may be used for other purposes; ask your health care provider or pharmacist if you have questions. What should I tell my health care provider before I take this medicine? They need to know if you have any of these conditions: -infection -kidney disease -low blood counts, like low white cell, platelet, or red cell counts -an unusual or allergic reaction to etoposide, other chemotherapeutic agents, other medicines, foods, dyes, or preservatives -pregnant or trying to get pregnant -breast-feeding How should I use this medicine? This medicine is for infusion into a vein. It is administered in a hospital or clinic by a specially trained health care professional. Talk to your pediatrician regarding the use of this medicine in children. Special care may be needed. Overdosage: If you think you have taken too much of this medicine contact a poison control center or emergency room at once. NOTE: This medicine is only for you. Do not share this medicine with others. What if I miss a dose? It is important not to miss your dose. Call your doctor or health care professional if you are unable to keep an  appointment. What may interact with this medicine? -cyclosporine -medicines to increase blood counts like filgrastim, pegfilgrastim, sargramostim -vaccines This list may not describe all possible interactions. Give your health care provider a list of all the medicines, herbs, non-prescription drugs, or dietary supplements you use. Also tell them if you smoke, drink alcohol, or use illegal drugs. Some items may interact with your medicine. What should I watch for while using this medicine? Visit your doctor for checks on your progress. This drug may make you feel generally unwell. This is not uncommon, as chemotherapy can affect healthy cells as well as cancer cells. Report any side effects. Continue your course of treatment even though you feel ill unless your doctor tells you to stop. In some cases, you may be given additional medicines to help with side effects. Follow all directions for their use. Call your doctor or health care professional for advice if you get a fever, chills or sore throat, or other symptoms of a cold or flu. Do not treat yourself. This drug decreases your body's ability to fight infections. Try to avoid being around people who are sick. This medicine may increase your risk to bruise or bleed. Call your doctor or health care professional if you notice any unusual bleeding. Be careful brushing and flossing your teeth or using a toothpick because you may get an infection or bleed more easily. If you have any dental work done, tell your dentist you are receiving this medicine. Avoid taking products that contain aspirin, acetaminophen, ibuprofen, naproxen, or ketoprofen unless instructed by your doctor. These medicines may hide a fever. Do not become pregnant while taking this medicine. Women should inform their doctor if they wish to become pregnant or think they might be pregnant. There is a potential for serious side effects to an unborn child. Talk to your health care  professional or pharmacist for more information. Do not breast-feed an infant while taking this medicine. What side effects may I notice from receiving this medicine? Side effects that you should report to your doctor or health care professional as soon as possible: -allergic reactions like skin rash, itching or hives, swelling of the face, lips, or tongue -low blood counts - this medicine may decrease the number of white  blood cells, red blood cells and platelets. You may be at increased risk for infections and bleeding. -signs of infection - fever or chills, cough, sore throat, pain or difficulty passing urine -signs of decreased platelets or bleeding - bruising, pinpoint red spots on the skin, black, tarry stools, blood in the urine -signs of decreased red blood cells - unusually weak or tired, fainting spells, lightheadedness -breathing problems -changes in vision -mouth or throat sores or ulcers -pain, redness, swelling or irritation at the injection site -pain, tingling, numbness in the hands or feet -redness, blistering, peeling or loosening of the skin, including inside the mouth -seizures -vomiting Side effects that usually do not require medical attention (report to your doctor or health care professional if they continue or are bothersome): -diarrhea -hair loss -loss of appetite -nausea -stomach pain This list may not describe all possible side effects. Call your doctor for medical advice about side effects. You may report side effects to FDA at 1-800-FDA-1088. Where should I keep my medicine? This drug is given in a hospital or clinic and will not be stored at home. NOTE: This sheet is a summary. It may not cover all possible information. If you have questions about this medicine, talk to your doctor, pharmacist, or health care provider.  2012, [employer]. (04/03/2008 5:24:12 PM)Carboplatin injection What is this medicine?   CARBOPLATIN (KAR boe pla tin) is a  chemotherapy drug. It targets fast dividing cells, like cancer cells, and causes these cells to die. This medicine is used to treat ovarian cancer and many other cancers. This medicine may be used for other purposes; ask your health care provider or pharmacist if you have questions. What should I tell my health care provider before I take this medicine? They need to know if you have any of these conditions: -blood disorders -hearing problems -kidney disease -recent or ongoing radiation therapy -an unusual or allergic reaction to carboplatin, cisplatin, other chemotherapy, other medicines, foods, dyes, or preservatives -pregnant or trying to get pregnant -breast-feeding How should I use this medicine? This drug is usually given as an infusion into a vein. It is administered in a hospital or clinic by a specially trained health care professional. Talk to your pediatrician regarding the use of this medicine in children. Special care may be needed. Overdosage: If you think you have taken too much of this medicine contact a poison control center or emergency room at once. NOTE: This medicine is only for you. Do not share this medicine with others. What if I miss a dose? It is important not to miss a dose. Call your doctor or health care professional if you are unable to keep an appointment. What may interact with this medicine? -medicines for seizures -medicines to increase blood counts like filgrastim, pegfilgrastim, sargramostim -some antibiotics like amikacin, gentamicin, neomycin, streptomycin, tobramycin -vaccines Talk to your doctor or health care professional before taking any of these medicines: -acetaminophen -aspirin -ibuprofen -ketoprofen -naproxen This list may not describe all possible interactions. Give your health care provider a list of all the medicines, herbs, non-prescription drugs, or dietary supplements you use. Also tell them if you smoke, drink alcohol, or use illegal  drugs. Some items may interact with your medicine. What should I watch for while using this medicine? Your condition will be monitored carefully while you are receiving this medicine. You will need important blood work done while you are taking this medicine. This drug may make you feel generally unwell. This is not uncommon, as  chemotherapy can affect healthy cells as well as cancer cells. Report any side effects. Continue your course of treatment even though you feel ill unless your doctor tells you to stop. In some cases, you may be given additional medicines to help with side effects. Follow all directions for their use. Call your doctor or health care professional for advice if you get a fever, chills or sore throat, or other symptoms of a cold or flu. Do not treat yourself. This drug decreases your body's ability to fight infections. Try to avoid being around people who are sick. This medicine may increase your risk to bruise or bleed. Call your doctor or health care professional if you notice any unusual bleeding. Be careful brushing and flossing your teeth or using a toothpick because you may get an infection or bleed more easily. If you have any dental work done, tell your dentist you are receiving this medicine. Avoid taking products that contain aspirin, acetaminophen, ibuprofen, naproxen, or ketoprofen unless instructed by your doctor. These medicines may hide a fever. Do not become pregnant while taking this medicine. Women should inform their doctor if they wish to become pregnant or think they might be pregnant. There is a potential for serious side effects to an unborn child. Talk to your health care professional or pharmacist for more information. Do not breast-feed an infant while taking this medicine. What side effects may I notice from receiving this medicine? Side effects that you should report to your doctor or health care professional as soon as possible: -allergic reactions like  skin rash, itching or hives, swelling of the face, lips, or tongue -signs of infection - fever or chills, cough, sore throat, pain or difficulty passing urine -signs of decreased platelets or bleeding - bruising, pinpoint red spots on the skin, black, tarry stools, nosebleeds -signs of decreased red blood cells - unusually weak or tired, fainting spells, lightheadedness -breathing problems -changes in hearing -changes in vision -chest pain -high blood pressure -low blood counts - This drug may decrease the number of white blood cells, red blood cells and platelets. You may be at increased risk for infections and bleeding. -nausea and vomiting -pain, swelling, redness or irritation at the injection site -pain, tingling, numbness in the hands or feet -problems with balance, talking, walking -trouble passing urine or change in the amount of urine Side effects that usually do not require medical attention (report to your doctor or health care professional if they continue or are bothersome): -hair loss -loss of appetite -metallic taste in the mouth or changes in taste This list may not describe all possible side effects. Call your doctor for medical advice about side effects. You may report side effects to FDA at 1-800-FDA-1088. Where should I keep my medicine? This drug is given in a hospital or clinic and will not be stored at home. NOTE: This sheet is a summary. It may not cover all possible information. If you have questions about this medicine, talk to your doctor, pharmacist, or health care provider.  2012, [employer]. (03/07/2008 2:38:05 PM)  OR Brentuximab for 2 cycles   Brentuximab vedotin solution for injection What is this medicine? BRENTUXIMAB VEDOTIN (bren TUX see mab ve DOE tin) is a chemotherapy drug. It targets a specific protein within cancer cells and stops the cancer cells from growing. This medicine is used for treating Hodgkin's lymphoma and certain  non-Hodgkin's lymphomas, such as systemic anaplastic large cell lymphoma. This medicine may be used for other purposes; ask  your health care provider or pharmacist if you have questions. What should I tell my health care provider before I take this medicine? They need to know if you have any of these conditions: -immune system problems -infection (especially a virus infection such as chickenpox, cold sores, or herpes) -low blood counts, like low white cell, platelet, or red cell counts -tingling of the fingers or toes, or other nerve disorder -an unusual or allergic reaction to brentuximab vedotin, other medicines, foods, dyes, or preservatives -pregnant or trying to get pregnant -breast-feeding How should I use this medicine? This medicine is for infusion into a vein. It is given by a health care professional in a hospital or clinic setting. Talk to your pediatrician regarding the use of this medicine in children. Special care may be needed. Overdosage: If you think you've taken too much of this medicine contact a poison control center or emergency room at once. Overdosage: If you think you have taken too much of this medicine contact a poison control center or emergency room at once. NOTE: This medicine is only for you. Do not share this medicine with others. What if I miss a dose? It is important not to miss your dose. Call your doctor or health care professional if you are unable to keep an appointment. What may interact with this medicine? This medicine may interact with the following medications: -ketoconazole -rifampin -St. John's wort; Hypericum perforatum This list may not describe all possible interactions. Give your health care provider a list of all the medicines, herbs, non-prescription drugs, or dietary supplements you use. Also tell them if you smoke, drink alcohol, or use illegal drugs. Some items may interact with your medicine. What should I watch for while using this  medicine? Visit your doctor for checks on your progress. This drug may make you feel generally unwell. This is not uncommon, as chemotherapy can affect healthy cells as well as cancer cells. Report any side effects. Continue your course of treatment even though you feel ill unless your doctor tells you to stop. Call your doctor or health care professional for advice if you get a fever, chills or sore throat, or other symptoms of a cold or flu. Do not treat yourself. This drug decreases your body's ability to fight infections. Try to avoid being around people who are sick. This medicine may increase your risk to bruise or bleed. Call your doctor or health care professional if you notice any unusual bleeding. Be careful brushing and flossing your teeth or using a toothpick because you may get an infection or bleed more easily. If you have any dental work done, tell your dentist you are receiving this medicine. Avoid taking products that contain aspirin, acetaminophen, ibuprofen, naproxen, or ketoprofen unless instructed by your doctor. These medicines may hide a fever. Do not become pregnant while taking this medicine. Women should inform their doctor if they wish to become pregnant or think they might be pregnant. There is a potential for serious side effects to an unborn child. Talk to your health care professional or pharmacist for more information. Do not breast-feed an infant while taking this medicine. What side effects may I notice from receiving this medicine? Side effects that you should report to your doctor or health care professional as soon as possible: -allergic reactions like skin rash, itching or hives, swelling of the face, lips, or tongue -low blood counts - this medicine may decrease the number of white blood cells, red blood cells and platelets. You  may be at increased risk for infections and bleeding. -pain, tingling, numbness in the hands or feet -redness, blistering, peeling or  loosening of the skin, including inside the mouth -signs of infection - fever or chills, cough, sore throat, pain or difficulty passing urine -signs of decreased platelets or bleeding - bruising, pinpoint red spots on the skin, black, tarry stools, blood in the urine -signs of decreased red blood cells - unusually weak or tired, fainting spells, lightheadedness Side effects that usually do not require medical attention (Report these to your doctor or health care professional if they continue or are bothersome.): -cough -diarrhea -dizziness -headache -muscle pain -nausea, vomiting -stomach pain -tiredness This list may not describe all possible side effects. Call your doctor for medical advice about side effects. You may report side effects to FDA at 1-800-FDA-1088. Where should I keep my medicine? This drug is given in a hospital or clinic and will not be stored at home. NOTE: This sheet is a summary. It may not cover all possible information. If you have questions about this medicine, talk to your doctor, pharmacist, or health care provider.  2012, [employer]. (08/13/2010 2:27:13 PM)

## 2013-10-14 NOTE — Telephone Encounter (Signed)
s.w. pt and advised on 11.12.14 mammo...Marland Kitchenper Darel Hong @ [facility] they would sched mammo not bx...will only sched bx if pt need it after mammo results

## 2013-10-14 NOTE — Telephone Encounter (Signed)
Gave pt appt for lab and MD for November 2014 °

## 2013-10-16 NOTE — Progress Notes (Signed)
[facility] OFFICE PROGRESS NOTE  [name],[name], MD [address] [place] [address]  DIAGNOSIS: Normocytic anemia  Acute renal failure  Other malignant lymphomas, unspecified site, extranodal and solid organ sites  Neuropathy due to chemotherapeutic drug  Chief Complaint  Patient presents with  . Other malignant lymphomas, unspecified site, extranodal and    CURRENT THERAPY: CHOEP q 21 days started on 05/20/2013.  Completed 6 of 6 planned cycles for CHOEP on 09/05/2013.  Cycle #5 was complicated by ICU-level care secondary to severe hyperglycemia (see prior note for details).  Cycle #6, prednisone was excluded.  She received neulasta throughout her therapy.   INTERVAL HISTORY: Megan Mcknight 52 y.o. 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 She was last seen by me on 09/29/2013. She was also evaluated by [facility] (Dr. Alphonsa Gin) concerning candidacy for bone marrow transplant. Previously, she had completed her cycles #6 of 6 with (CHOEP--Cytoxan, doxorubicin, etoposide and prednisone) in conjunction with Neulasta. Prednisone dose was withheld due to her hypoglycemia following cycle #5 as noted previously. She had a repeat PET scan done on 09/26/2013 which was consistent with a partial response.  Today, she is accompanied by her husband.  She reports of feeling well overall. She denies any nausea/vomiting/fever or chills/emergency room visits/hospitalizations. He reports continued out lateral hand tingling and numbness that is stable. She also endorses diarrhea which has improved with antidiarrheal medications including Imodium. He denies any chest pain, palpitations, worsening fatigue. Today she is accompanied by her husband.  She complains of having some remaining blurred vision likely secondary to the hyperglycemia. She plans to see an ophthalmologist regarding her vision. She has lost her hair.  She only has some minimal dyspnea on exertion and is not using any oxygen. She denies any pain. She denied cough. She denied a mucositis or night sweats.  MEDICAL HISTORY: Past Medical History  Diagnosis Date  . Hyperlipidemia   . Nerve pain     Left leg  . Seasonal allergies   . Acute bronchitis   . Colon polyps 02/2013    Per colonoscopy  . Diverticulosis 02/2013    Per colonoscopy  . Former light tobacco smoker   . Dysrhythmia   . Cancer     lung and kidney  . Anaplastic large cell lymphoma     INTERIM HISTORY: has GERD (gastroesophageal reflux disease); Shortness of breath; Atrial fibrillation with RVR; Bilateral pneumonia; Acute renal insufficiency; Normocytic anemia; Other malignant lymphomas, unspecified site, extranodal and solid organ sites; Tumor lysis syndrome; Hyperglycemia without ketosis; Acute renal failure; Acute encephalopathy; Hyperkalemia; and Neuropathy due to chemotherapeutic drug on her problem list.    ALLERGIES:  is allergic to iodine and shellfish-derived products.  MEDICATIONS: has a current medication list which includes the following prescription(s): albuterol, allopurinol, amlodipine, ibuprofen, insulin glargine, lansoprazole, lidocaine-prilocaine, metformin, metoprolol tartrate, montelukast, prochlorperazine, and rosuvastatin.  SURGICAL HISTORY:  Past Surgical History  Procedure Laterality Date  . Partial hysterectomy    . Bones removed from baby toes    . Colonoscopy  02/16/2012    Procedure: COLONOSCOPY;  Surgeon: Corbin Ade, MD;  Location: [facility];  Service: Endoscopy;  Laterality: N/A;  1:30  . Abdominal hysterectomy    . Video bronchoscopy with endobronchial ultrasound N/A 05/13/2013    Procedure: VIDEO BRONCHOSCOPY WITH ENDOBRONCHIAL ULTRASOUND;  Surgeon: Loreli Slot, MD;  Location: [facility] OR;  Service: Thoracic;  Laterality: N/A;  .  Mediastinoscopy N/A 05/13/2013    Procedure: MEDIASTINOSCOPY;  Surgeon: Loreli Slot, MD;   Location: [facility] OR;  Service: Thoracic;  Laterality: N/A;   PROBLEM LIST:  1. Anaplastic large-cell lymphoma, CD 30 positive, ALK positive, CD 20, negative, high grade, stage IV with involvement of mediastinal lymph nodes from which biopsy was obtained on 05/13/2013 by Viviann Spare C. Dorris Fetch, M.D. There is also involvement of the lungs with multiple lung nodules, liver and bilateral inguinal lymph nodes,  the latter seen on PET scan from 05/30/2013. Bone marrow on 05/18/2013 was negative. IPI score was 2. Histologic diagnosis was made on 05/13/2013 by mediastinal lymph node biopsy. Chemotherapy with Cytoxan, Adriamycin, vincristine, etoposide and prednisone in conjunction with Neulasta was initiated on 05/20/2013.  2. Brief episode of atrial fibrillation with rapid ventricular  response in late May 2014 at time of lymphoma presentation.  3. Dyslipidemia.  4. Seasonal allergies.  5. Diverticulosis.  6. Status post hysterectomy.  7. Right-sided Port-A-Cath placed on 05/23/2013.  8. Hyperglycemia/Diabetes Mellitus following cycle #5.   ONCOLOGY TIMELINE: *05/10/2013.  The patient was found to have extensive pulmonary nodules on a chest CT scan.  *05/13/2013. Video bronchoscopy with endobronchial ultrasound and mediastinoscopy by Salvatore Decent. Hendrickson,  mediastinal biopsies showed anaplastic large-cell lymphoma, CD 30 and ALK positive. CD 20 negative. Tumor was high-grade stage IV with an IPI score of 2.   *05/18/2013. Bone marrow aspirate and biopsy on  was negative for lymphoma.   *05/20/2013.Chemotherapy with CHOEP regimen was started with slightly decreased dose due to profound bone marrow suppression.     *05/23/2013. Port-A-Cath placed via interventional radiology on the right side on 05/23/2013.   *05/30/2013.  PET scan following cycle #1 shows only uptake in bilateral inguinal lymph nodes that are not really significantly enlarged. The patient has had a striking regression of tumor in her  lungs, at least 90%. There has also been a decrease in the liver masses. None of those areas showed increased uptake, interestingly enough.   REVIEW OF SYSTEMS:   Constitutional: Denies fevers, chills or abnormal weight loss Eyes: Denies blurriness of vision Ears, nose, mouth, throat, and face: Denies mucositis or sore throat Respiratory: Denies cough, dyspnea or wheezes Cardiovascular: Denies palpitation, chest discomfort or lower extremity swelling Gastrointestinal:  Denies nausea, heartburn or change in bowel habits Skin: Denies abnormal skin rashes Lymphatics: Denies new lymphadenopathy or easy bruising Neurological:Denies numbness, tingling or new weaknesses Behavioral/Psych: Mood is stable, no new changes  All other systems were reviewed with the patient and are negative.  PHYSICAL EXAMINATION: ECOG PERFORMANCE STATUS: 0 - Asymptomatic  Blood pressure 119/78, pulse 80, temperature 97.9 F (36.6 C), temperature source Oral, resp. rate 20, height 5\' 8"  (1.727 m), weight 210 lb 14.4 oz (95.664 kg).  GENERAL:alert, no distress and comfortable; Alopecia SKIN: skin color, texture, turgor are normal, no rashes or significant lesions; R port a cath in place.   EYES: normal, Conjunctiva are pink and non-injected, sclera clear OROPHARYNX:no exudate, no erythema and lips, buccal mucosa, and tongue normal  NECK: supple, thyroid normal size, non-tender, without nodularity LYMPH:  no palpable lymphadenopathy in the cervical, axillary or supraclavicular LUNGS: clear to auscultation and percussion with normal breathing effort HEART: regular rate & rhythm and no murmurs and no lower extremity edema ABDOMEN:abdomen soft, non-tender and normal bowel sounds Musculoskeletal:no cyanosis of digits and no clubbing; darkening of both hands bilaterally with some dry skin.    NEURO: alert & oriented x 3 with fluent speech, no focal  motor/sensory deficits   Labs:  Lab Results  Component Value Date    WBC 4.5 10/14/2013   HGB 10.6* 10/14/2013   HCT 33.6* 10/14/2013   MCV 87.1 10/14/2013   PLT 220 10/14/2013   NEUTROABS 2.7 10/14/2013      Chemistry      Component Value Date/Time   NA 145 10/14/2013 1255   NA 142 08/29/2013 0530   K 3.7 10/14/2013 1255   K 3.6 08/29/2013 0530   CL 111 08/29/2013 0530   CO2 22 10/14/2013 1255   CO2 24 08/29/2013 0530   BUN 7.1 10/14/2013 1255   BUN 6 08/29/2013 0530   CREATININE 0.8 10/14/2013 1255   CREATININE 0.76 08/29/2013 0530      Component Value Date/Time   CALCIUM 9.9 10/14/2013 1255   CALCIUM 9.0 08/29/2013 0530   ALKPHOS 65 10/14/2013 1255   ALKPHOS 96 05/24/2013 0510   AST 15 10/14/2013 1255   AST 25 05/24/2013 0510   ALT 14 10/14/2013 1255   ALT 75* 05/24/2013 0510   BILITOT 0.42 10/14/2013 1255   BILITOT 0.3 05/24/2013 0510     Basic Metabolic Panel:  Recent Labs Lab 10/14/13 1255  NA 145  K 3.7  CO2 22  GLUCOSE 99  BUN 7.1  CREATININE 0.8  CALCIUM 9.9   GFR Estimated Creatinine Clearance: 100.6 ml/min (by C-G formula based on Cr of 0.8). Liver Function Tests:  Recent Labs Lab 10/14/13 1255  AST 15  ALT 14  ALKPHOS 65  BILITOT 0.42  PROT 6.9  ALBUMIN 3.9   CBC:  Recent Labs Lab 10/14/13 1254  WBC 4.5  NEUTROABS 2.7  HGB 10.6*  HCT 33.6*  MCV 87.1  PLT 220   Studies:  No results found.   RADIOGRAPHIC STUDIES:  (This study was personally reviewed by me) Nm Pet Image Restag (ps) Skull Base To Thigh  09/26/2013   CLINICAL DATA:  Subsequent treatment strategy for lymphoma.  EXAM: NUCLEAR MEDICINE PET SKULL BASE TO THIGH  FASTING BLOOD GLUCOSE:  Value:  58 mg/dl  TECHNIQUE: 40.9 mCi W-11 FDG was injected intravenously. CT data was obtained and used for attenuation correction and anatomic localization only. (This was not acquired as a diagnostic CT examination.) Additional exam technical data entered on technologist worksheet.  COMPARISON:  05/30/2013 and CT chest 05/10/2013.  FINDINGS: NECK  No  hypermetabolic lymph nodes in the neck. CT images show no acute findings.  CHEST  A 9 x 11 mm soft tissue nodule in the lateral left breast has an SUV max of 2.2. Skin thickening in the left axillary region has an SUV max of 3.7.  Mid and distal portion of the esophagus appear hypermetabolic, with SUV max of 12.6.  Confluent soft tissue in the infrahilar aspect of the superior segment right lower lobe measures approximately 1.4 x 2.1 cm with an SUV max of 4.2. Associated volume loss in the right lower lobe is hypermetabolic as well.  CT images show no acute findings. No pericardial or pleural effusion.  ABDOMEN/PELVIS  Mildly hypermetabolic external iliac and inguinal lymph nodes measure up to 10 mm on the left, with an SUV max of 4.6. No additional areas of abnormal hypermetabolism in the abdomen or pelvis. CT images show no acute findings.  SKELETON  No focal hypermetabolic activity to suggest skeletal metastasis.  IMPRESSION: 1. Mildly hypermetabolic bilateral external iliac and inguinal lymph nodes, as before. 2. Small area of residual hypermetabolic confluent soft tissue in the infrahilar right lower  lobe with associated volume loss in the right lower lobe. Overall improved appearance of metastatic disease in the lungs. 3. Mildly hypermetabolic skin thickening in the left axillary region with a mildly hypermetabolic soft tissue nodule in the lateral left breast. Please correlate clinically with physical exam and mammogram, as clinically indicated. 4. Hypermetabolism involving the mid and distal portions of the esophagus. Findings are new from 05/30/2013 and may relate to esophagitis   Electronically Signed   By: Leanna Battles M.D.   On: 09/26/2013 14:34   ASSESSMENT: Megan Mcknight 52 y.o. female with a history of Normocytic anemia  Acute renal failure  Other malignant lymphomas, unspecified site, extranodal and solid organ sites  Neuropathy due to chemotherapeutic drug  PLAN:  1. Stage IV  Anaplastic Large-cell lymphoma (ALCL) CD30+, ALK+, CD20(-). --Last visit, we reviewed in depth her PET Scan done on 09/26/2013 following 6 cycles of CHOEP (Absent Prednisone for cycle #6). It is as summarized as follows: 1.Mildly hypermetabolic bilateral external iliac and inguinal lymph nodes, as before. 2. Small area of residual hypermetabolic confluent soft tissue in the infrahilar right lower lobe with associated volume loss in the right lower lobe. Overall improved appearance of metastatic disease in the lungs. 3. Mildly hypermetabolic skin thickening in the left axillary region with a mildly hypermetabolic soft tissue nodule in the lateral left breast. 4. Hypermetabolism involving the mid and distal portions of the esophagus.  The above findings does not suggest a complete response to her therapy. In addition, given the findings of her esophagus (although this could be esophagitis) it could also represent progressive disease or partial response.  So I recommended referral to [facility] for consideration of stem cell transplant.  She was seen by Dr. Alphonsa Gin on 10/05/2013 at 11 am.    -- I discussed the plans with Dr. Alphonsa Gin of [facility]. They reviewed her recent scan and recommends to biopsy breast mass and one of the lymph nodes of the inguinal area (one that is most PET avid). Plans will be according to one of the 3 options based on the results of the biopsies including the following: 1) If Alk positive in both biopsy, perform 2-3 cycles of ICE or 2 cycles of Brentuximab( preferred by [facility]) or if 2) biopsy of the breast reveals a primary breast oncology, she will require a referral to breast oncology and see if treatment for primary breast could be delayed or 3) if node negative for both ALK lymphoma, proceed directly to BMT. She has been referred to surgery for consideration of an inguinal lymph node excisional biopsy and to breast clinic for mammogram with biopsy. She will  follow-up to discuss the results and implement chemotherapy or follow-up with [facility].   2. Anemia secondary to chemotherapy. --Her counts including her WBC, Plts have recovered.  She does not have any symptoms of anemia presently.    3. Neuropathy secondary to chemotherapy.  --Tingling in hands is stable.  She can button her shirts without difficulty.   4. 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 --Patient will follow up with opthalmology for further evaluation and management.   5. Follow-up. --Patient should follow up in 2-3 weeks with CMP, CBC.    All questions were answered. The patient knows to call the clinic with any problems, questions or concerns. We can certainly see the patient much sooner if necessary.  I spent 25 minutes counseling the patient face to face. The total time spent in the appointment was 40  minutes.    Secret Kristensen, MD 10/16/2013 9:46 PM

## 2013-10-18 ENCOUNTER — Other Ambulatory Visit: Payer: Self-pay | Admitting: Internal Medicine

## 2013-10-19 ENCOUNTER — Telehealth: Payer: Self-pay | Admitting: Internal Medicine

## 2013-10-19 NOTE — Telephone Encounter (Signed)
, °

## 2013-10-20 ENCOUNTER — Encounter (INDEPENDENT_AMBULATORY_CARE_PROVIDER_SITE_OTHER): Payer: Self-pay | Admitting: General Surgery

## 2013-10-20 ENCOUNTER — Ambulatory Visit (INDEPENDENT_AMBULATORY_CARE_PROVIDER_SITE_OTHER): Payer: PRIVATE HEALTH INSURANCE | Admitting: General Surgery

## 2013-10-20 ENCOUNTER — Other Ambulatory Visit: Payer: Self-pay

## 2013-10-20 VITALS — BP 120/78 | HR 72 | Temp 97.7°F | Resp 18 | Ht 68.0 in | Wt 211.0 lb

## 2013-10-20 DIAGNOSIS — C8589 Other specified types of non-Hodgkin lymphoma, extranodal and solid organ sites: Secondary | ICD-10-CM

## 2013-10-20 DIAGNOSIS — R590 Localized enlarged lymph nodes: Secondary | ICD-10-CM

## 2013-10-20 DIAGNOSIS — N6320 Unspecified lump in the left breast, unspecified quadrant: Secondary | ICD-10-CM | POA: Insufficient documentation

## 2013-10-20 DIAGNOSIS — N63 Unspecified lump in unspecified breast: Secondary | ICD-10-CM

## 2013-10-20 DIAGNOSIS — R599 Enlarged lymph nodes, unspecified: Secondary | ICD-10-CM

## 2013-10-20 NOTE — Patient Instructions (Signed)
Although I cannot feel an obviously enlarged lymph node in her left groin, the PET scan suggests abnormal lymph nodes in the left groin.  Usual eventually need to have a left inguinal lymph node biopsy under general anesthesia. We have discussed this today.  I'm not sure whether there is anything wrong in your breast. We will hold off on the inguinal lymph node biopsy until we know for sure. Return to see Dr. Derrell Lolling in approximately 10-12 days, after the mammograms and breast ultrasound have been performed.  At the next office visit, we will schedule definitive surgery for your left inguinal lymph node, and if necessary, the left breast.

## 2013-10-20 NOTE — Progress Notes (Signed)
Patient ID: Megan Mcknight, female   DOB: Aug 09, 1961, 52 y.o.   MRN: [id number]  Chief Complaint  Patient presents with  . Other    Eval for lymph node bx    HPI Megan Mcknight is a 52 y.o. female.  She is referred by Dr. Rosie Fate at [facility]  for consideration of left inguinal lymph node biopsy and possible left breast biopsy.  This patient has a history of anaplastic large cell lymphoma diagnosed in May of this year. She's had mediastinal and thoracoscopic biopsy Dr. Dorris Fetch. She's had several rounds of chemotherapy. She's doing pretty well. She's had some complications including acute encephalopathy which is resolved, neuropathy, tumor lysis syndrome, and anemia. She also suffered acute renal failure which has resolved.  Recent PET scan shows avid uptake in the left greater than the right inguinal and external iliac areas, and a mild area of uptake in the lateral left breast. She is scheduled for mammograms on November 12.  She was referred to [facility] for consideration of stem cell transplant. The consult there referred her back  stating that the left inguinal lymph node needed to be biopsied and the left breast nees  to be worked up and if necessary biopsy before any consideration of stem cell transplant.  In terms of the patient's inguinal area she does not feel any pain or mass. In terms of her breasts she feels that she's always had some thickening on the left sinceshe was a teenager. No prior history of any breast surgery. No family history of breast cancer. HPI  Past Medical History  Diagnosis Date  . Hyperlipidemia   . Nerve pain     Left leg  . Seasonal allergies   . Acute bronchitis   . Colon polyps 02/2013    Per colonoscopy  . Diverticulosis 02/2013    Per colonoscopy  . Former light tobacco smoker   . Dysrhythmia   . Cancer     lung and kidney  . Anaplastic large cell lymphoma   . Asthma   . GERD (gastroesophageal reflux disease)   . Diabetes mellitus without  complication     Past Surgical History  Procedure Laterality Date  . Partial hysterectomy    . Bones removed from baby toes    . Colonoscopy  02/16/2012    Procedure: COLONOSCOPY;  Surgeon: Corbin Ade, MD;  Location: [facility];  Service: Endoscopy;  Laterality: N/A;  1:30  . Abdominal hysterectomy    . Video bronchoscopy with endobronchial ultrasound N/A 05/13/2013    Procedure: VIDEO BRONCHOSCOPY WITH ENDOBRONCHIAL ULTRASOUND;  Surgeon: Loreli Slot, MD;  Location: [facility] OR;  Service: Thoracic;  Laterality: N/A;  . Mediastinoscopy N/A 05/13/2013    Procedure: MEDIASTINOSCOPY;  Surgeon: Loreli Slot, MD;  Location: [facility] OR;  Service: Thoracic;  Laterality: N/A;    Family History  Problem Relation Age of Onset  . Heart defect      family history   . Asthma      family history   . Colon polyps Mother     Social History History  Substance Use Topics  . Smoking status: Former Smoker -- 0.50 packs/day    Types: Cigarettes    Quit date: 04/19/2013  . Smokeless tobacco: Not on file  . Alcohol Use: No     Comment: one glass every three weeks    Allergies  Allergen Reactions  . Iodine Anaphylaxis    Told to avoid due to shellfish allergy.  Marland Kitchen  Shellfish-Derived Products Anaphylaxis and Swelling    Eyes swelling, scratchy throat, bumps on lips.    Current Outpatient Prescriptions  Medication Sig Dispense Refill  . albuterol (PROVENTIL HFA;VENTOLIN HFA) 108 (90 BASE) MCG/ACT inhaler Inhale 2 puffs into the lungs every 6 (six) hours as needed for wheezing.      Marland Kitchen allopurinol (ZYLOPRIM) 300 MG tablet Take 1 tablet (300 mg total) by mouth daily.  30 tablet  1  . amLODipine (NORVASC) 5 MG tablet Take 1 tablet (5 mg total) by mouth daily.  30 tablet  0  . ibuprofen (ADVIL,MOTRIN) 200 MG tablet Take 400 mg by mouth every 6 (six) hours as needed for pain.      Marland Kitchen insulin glargine (LANTUS) 100 UNIT/ML injection Inject 0.4 mLs (40 Units total) into the skin daily.  10 mL  12   . lansoprazole (PREVACID) 15 MG capsule Take 15 mg by mouth daily.      Marland Kitchen lidocaine-prilocaine (EMLA) cream Apply to port site one hour before treatment and cover with plastic wrap  1 each  3  . metFORMIN (GLUCOPHAGE) 500 MG tablet Take 1 tablet (500 mg total) by mouth 2 (two) times daily with a meal.  60 tablet  3  . metoprolol tartrate (LOPRESSOR) 25 MG tablet Take 1 tablet (25 mg total) by mouth 3 (three) times daily.  90 tablet  0  . montelukast (SINGULAIR) 10 MG tablet Take 10 mg by mouth daily as needed (Allergies).       . prochlorperazine (COMPAZINE) 10 MG tablet Take 1 tablet (10 mg total) by mouth every 6 (six) hours as needed (nausea).  30 tablet  2  . rosuvastatin (CRESTOR) 10 MG tablet Take 10 mg by mouth daily.       No current facility-administered medications for this visit.    Review of Systems Review of Systems  Constitutional: Negative for fever, chills and unexpected weight change.  HENT: Negative for congestion, hearing loss, sore throat, trouble swallowing and voice change.   Eyes: Negative for visual disturbance.  Respiratory: Negative for cough and wheezing.   Cardiovascular: Negative for chest pain, palpitations and leg swelling.  Gastrointestinal: Negative for nausea, vomiting, abdominal pain, diarrhea, constipation, blood in stool, abdominal distention and anal bleeding.  Genitourinary: Negative for hematuria, vaginal bleeding and difficulty urinating.  Musculoskeletal: Negative for arthralgias.  Skin: Negative for rash and wound.  Neurological: Positive for numbness. Negative for seizures, syncope and headaches.  Hematological: Negative for adenopathy. Does not bruise/bleed easily.  Psychiatric/Behavioral: Negative for confusion.    Blood pressure 120/78, pulse 72, temperature 97.7 F (36.5 C), temperature source Oral, resp. rate 18, height 5\' 8"  (1.727 m), weight 211 lb (95.709 kg).  Physical Exam Physical Exam  Constitutional: She is oriented to person,  place, and time. She appears well-developed and well-nourished. No distress.  Present. Cooperative. Husband with her. BMI 32. BP 120/78.  HENT:  Head: Normocephalic and atraumatic.  Nose: Nose normal.  Mouth/Throat: No oropharyngeal exudate.  Eyes: Conjunctivae and EOM are normal. Pupils are equal, round, and reactive to light. Left eye exhibits no discharge. No scleral icterus.  Neck: Neck supple. No JVD present. No tracheal deviation present. No thyromegaly present.  Cardiovascular: Normal rate, regular rhythm, normal heart sounds and intact distal pulses.   No murmur heard. Pulmonary/Chest: Effort normal and breath sounds normal. No respiratory distress. She has no wheezes. She has no rales. She exhibits no tenderness.  Both breasts were examined. There is a little bit of  diffuse fibrocystic thickening in the upper outer quadrant of both breasts but no dominant mass or skin change. No axillary adenopathy. Again, exam limited by subcutaneous adipose tissue.  Abdominal: Soft. Bowel sounds are normal. She exhibits no distension and no mass. There is no tenderness. There is no rebound and no guarding.  Somewhat obese. Nontender. No mass. No organomegaly.  Genitourinary:  Groins were carefully examined. Mildly thickened subcutaneous tissue limits exam, but no hernia or adenopathy was noted.  Musculoskeletal: She exhibits no edema and no tenderness.  Lymphadenopathy:    She has no cervical adenopathy.  Neurological: She is alert and oriented to person, place, and time. She exhibits normal muscle tone. Coordination normal.  Skin: Skin is warm. No rash noted. She is not diaphoretic. No erythema. No pallor.  Psychiatric: She has a normal mood and affect. Her behavior is normal. Judgment and thought content normal.    Data Reviewed Cancer center nodes. Imaging studies. Hospital records. PET scan.  Assessment    Anaplastic large cell lymphoma, status post primary chemotherapy  PET avid  adenopathy, bilateral inguinal areas, left more prominent than right. I agree that this area should be biopsied and will plan to do that in the future  Question PET-avid uptake left breast laterally. I agree that should be evaluated prior to any surgery. Mammogram scheduled for next week  History Port-A-Cath insertion  History Laparoscopic hysterectomy   Anemia  Hypertension  Hyperlipidemia  Intermittent atrial fibrillation and congestive heart failure  Insulin-dependent diabetes mellitus  Last colonoscopy, March 2013    Plan    Long discussion with the patient regarding management of her left groin nodes and, if necessary, left breast.  Defer left inguinal lymph node biopsy until we have defined whether or not she has pathologic changes in her breasts.  Mammograms and left breast ultrasound scheduled for November 12  Return to see me after breast imaging studies and workup completed, approximately 2 weeks  At the next office visit we will schedule her definitive surgical procedures.        Angelia Mould. Derrell Lolling, M.D., [facility] General and Minimally invasive Surgery Breast and Colorectal Surgery Office:   [phone] Pager:   [phone]  10/20/2013, 1:39 PM

## 2013-10-26 ENCOUNTER — Ambulatory Visit (HOSPITAL_COMMUNITY)
Admission: RE | Admit: 2013-10-26 | Discharge: 2013-10-26 | Disposition: A | Discharge status: Home / Self Care | Payer: PRIVATE HEALTH INSURANCE | Source: Ambulatory Visit | Attending: Internal Medicine | Admitting: Internal Medicine

## 2013-10-26 DIAGNOSIS — C8589 Other specified types of non-Hodgkin lymphoma, extranodal and solid organ sites: Secondary | ICD-10-CM

## 2013-10-26 DIAGNOSIS — N63 Unspecified lump in unspecified breast: Secondary | ICD-10-CM | POA: Insufficient documentation

## 2013-10-26 DIAGNOSIS — Z87898 Personal history of other specified conditions: Secondary | ICD-10-CM | POA: Insufficient documentation

## 2013-10-27 ENCOUNTER — Telehealth: Payer: Self-pay | Admitting: Internal Medicine

## 2013-10-27 NOTE — Telephone Encounter (Signed)
Made patient aware of benign mammogram findings.  She has a follow-up on 11/18 for consideration of inguinal lymph node biopsy.

## 2013-10-31 ENCOUNTER — Other Ambulatory Visit (INDEPENDENT_AMBULATORY_CARE_PROVIDER_SITE_OTHER): Payer: Self-pay | Admitting: General Surgery

## 2013-10-31 ENCOUNTER — Telehealth (INDEPENDENT_AMBULATORY_CARE_PROVIDER_SITE_OTHER): Payer: Self-pay

## 2013-10-31 NOTE — Telephone Encounter (Signed)
I called and notified the pt that we can cancel the appointment and just schedule her surgery.  It will be at either [facility] or [facility].  It will be outpatient.  The schedulers will call her in the next day or so.

## 2013-10-31 NOTE — Telephone Encounter (Signed)
Message copied by Ivory Broad on Mon Oct 31, 2013  4:11 PM ------      Message from: Louie Casa      Created: Mon Oct 31, 2013  1:19 PM      Regarding: Dr. Derrell Lolling Tomorrow appt      Contact: [phone]       Patient has an appointment Tomorrow @ 2:00pm with Dr. Derrell Lolling to discuss her mammogram and schedule inguinal lymph node bx  but her oncology called and told her mammogram is benign.      She wants to know if still has to come to the appointment with       Dr. Derrell Lolling or we can call her schedule the biopsy, please call her.            Thank you ------

## 2013-11-01 ENCOUNTER — Encounter (INDEPENDENT_AMBULATORY_CARE_PROVIDER_SITE_OTHER): Payer: PRIVATE HEALTH INSURANCE | Admitting: General Surgery

## 2013-11-01 ENCOUNTER — Telehealth (INDEPENDENT_AMBULATORY_CARE_PROVIDER_SITE_OTHER): Payer: Self-pay | Admitting: General Surgery

## 2013-11-01 ENCOUNTER — Telehealth: Payer: Self-pay

## 2013-11-01 NOTE — Telephone Encounter (Signed)
Pt made aware [facility] per [facility] policy / cannot pay deposit / will contact her PCP /

## 2013-11-01 NOTE — Telephone Encounter (Signed)
Pt called stating Dr Jacinto Halim office is requiring co-pay $460 up front before they do biopsy. Asked if she called their financial counseling - she did not state a clear answer. I forwarded this message to [facility] financial advocate to see if she would have any ideas.

## 2013-11-04 ENCOUNTER — Other Ambulatory Visit: Payer: Self-pay | Admitting: Internal Medicine

## 2013-11-04 ENCOUNTER — Other Ambulatory Visit (HOSPITAL_BASED_OUTPATIENT_CLINIC_OR_DEPARTMENT_OTHER): Payer: PRIVATE HEALTH INSURANCE | Admitting: Lab

## 2013-11-04 ENCOUNTER — Telehealth: Payer: Self-pay | Admitting: Medical Oncology

## 2013-11-04 ENCOUNTER — Telehealth: Payer: Self-pay | Admitting: Internal Medicine

## 2013-11-04 ENCOUNTER — Ambulatory Visit (HOSPITAL_BASED_OUTPATIENT_CLINIC_OR_DEPARTMENT_OTHER): Payer: PRIVATE HEALTH INSURANCE | Admitting: Internal Medicine

## 2013-11-04 VITALS — BP 141/89 | HR 78 | Temp 98.2°F | Resp 19 | Ht 68.0 in | Wt 210.2 lb

## 2013-11-04 DIAGNOSIS — G620 Drug-induced polyneuropathy: Secondary | ICD-10-CM

## 2013-11-04 DIAGNOSIS — C8589 Other specified types of non-Hodgkin lymphoma, extranodal and solid organ sites: Secondary | ICD-10-CM

## 2013-11-04 DIAGNOSIS — D6481 Anemia due to antineoplastic chemotherapy: Secondary | ICD-10-CM

## 2013-11-04 DIAGNOSIS — H579 Unspecified disorder of eye and adnexa: Secondary | ICD-10-CM

## 2013-11-04 DIAGNOSIS — IMO0002 Reserved for concepts with insufficient information to code with codable children: Secondary | ICD-10-CM

## 2013-11-04 DIAGNOSIS — Z23 Encounter for immunization: Secondary | ICD-10-CM

## 2013-11-04 DIAGNOSIS — E1139 Type 2 diabetes mellitus with other diabetic ophthalmic complication: Secondary | ICD-10-CM

## 2013-11-04 DIAGNOSIS — T451X5A Adverse effect of antineoplastic and immunosuppressive drugs, initial encounter: Secondary | ICD-10-CM

## 2013-11-04 DIAGNOSIS — Z452 Encounter for adjustment and management of vascular access device: Secondary | ICD-10-CM

## 2013-11-04 LAB — CBC WITH DIFFERENTIAL/PLATELET
BASO%: 0.4 % (ref 0.0–2.0)
Basophils Absolute: 0 10*3/uL (ref 0.0–0.1)
EOS%: 2.5 % (ref 0.0–7.0)
Eosinophils Absolute: 0.1 10*3/uL (ref 0.0–0.5)
HCT: 36.8 % (ref 34.8–46.6)
HGB: 11.5 g/dL — ABNORMAL LOW (ref 11.6–15.9)
LYMPH%: 30.8 % (ref 14.0–49.7)
MCH: 26.6 pg (ref 25.1–34.0)
MCHC: 31.3 g/dL — ABNORMAL LOW (ref 31.5–36.0)
MCV: 85 fL (ref 79.5–101.0)
MONO#: 0.2 10*3/uL (ref 0.1–0.9)
MONO%: 3.5 % (ref 0.0–14.0)
NEUT#: 3.3 10*3/uL (ref 1.5–6.5)
NEUT%: 62.8 % (ref 38.4–76.8)
Platelets: 247 10*3/uL (ref 145–400)
RBC: 4.33 10*6/uL (ref 3.70–5.45)
RDW: 14.9 % — ABNORMAL HIGH (ref 11.2–14.5)
WBC: 5.2 10*3/uL (ref 3.9–10.3)
lymph#: 1.6 10*3/uL (ref 0.9–3.3)

## 2013-11-04 LAB — COMPREHENSIVE METABOLIC PANEL (CC13)
ALT: 22 U/L (ref 0–55)
AST: 19 U/L (ref 5–34)
Albumin: 4 g/dL (ref 3.5–5.0)
Alkaline Phosphatase: 73 U/L (ref 40–150)
Anion Gap: 11 mEq/L (ref 3–11)
BUN: 15 mg/dL (ref 7.0–26.0)
CO2: 22 mEq/L (ref 22–29)
Calcium: 9.8 mg/dL (ref 8.4–10.4)
Chloride: 112 mEq/L — ABNORMAL HIGH (ref 98–109)
Creatinine: 0.8 mg/dL (ref 0.6–1.1)
Glucose: 96 mg/dl (ref 70–140)
Potassium: 3.7 mEq/L (ref 3.5–5.1)
Sodium: 145 mEq/L (ref 136–145)
Total Bilirubin: 0.38 mg/dL (ref 0.20–1.20)
Total Protein: 7.2 g/dL (ref 6.4–8.3)

## 2013-11-04 LAB — LACTATE DEHYDROGENASE (CC13): LDH: 159 U/L (ref 125–245)

## 2013-11-04 MED ORDER — INFLUENZA VAC SPLIT QUAD 0.5 ML IM SUSP
0.5000 mL | Freq: Once | INTRAMUSCULAR | Status: AC
  Administered 2013-11-04: 0.5 mL via INTRAMUSCULAR
  Filled 2013-11-04: qty 0.5

## 2013-11-04 MED ORDER — SODIUM CHLORIDE 0.9 % IJ SOLN
10.0000 mL | INTRAMUSCULAR | Status: DC | PRN
  Administered 2013-11-04: 10 mL via INTRAVENOUS
  Filled 2013-11-04: qty 10

## 2013-11-04 MED ORDER — HEPARIN SOD (PORK) LOCK FLUSH 100 UNIT/ML IV SOLN
500.0000 [IU] | Freq: Once | INTRAVENOUS | Status: AC
  Administered 2013-11-04: 500 [IU] via INTRAVENOUS
  Filled 2013-11-04: qty 5

## 2013-11-04 NOTE — Progress Notes (Signed)
[facility] OFFICE PROGRESS NOTE  [name],[name], MD [address] [place] [address]  DIAGNOSIS: Neuropathy due to chemotherapeutic drug - Plan: heparin lock flush 100 unit/mL, sodium chloride 0.9 % injection 10 mL  Other malignant lymphomas, unspecified site, extranodal and solid organ sites - Plan: CBC with Differential, Comprehensive metabolic panel, heparin lock flush 100 unit/mL, sodium chloride 0.9 % injection 10 mL  Chief Complaint  Patient presents with  . Other malignant lymphomas, unspecified site, extranodal and    CURRENT THERAPY: CHOEP q 21 days started on 05/20/2013.  Completed 6 of 6 planned cycles for CHOEP on 09/05/2013.  Cycle #5 was complicated by ICU-level care secondary to severe hyperglycemia (see prior note for details).  Cycle #6, prednisone was excluded.  She received neulasta throughout her therapy.   INTERVAL HISTORY: CALEE NUGENT 52 y.o. 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 She was last seen by me on 10/14/2013.  Today, she is accompanied by her husband. She had a mammogram to evaluate a right breast mass which was negative for malignancy.   She was referred to surgery for lymph node biopsy scheduled for 11/01/2013 but unable to afford the co-payment.    She was also evaluated by [facility] (Dr. Alphonsa Gin) concerning candidacy for bone marrow transplant. Previously, she had completed her cycles #6 of 6 with (CHOEP--Cytoxan, doxorubicin, etoposide and prednisone) in conjunction with Neulasta. Prednisone dose was withheld due to her hypoglycemia following cycle #5 as noted previously. She had a repeat PET scan done on 09/26/2013 which was consistent with a partial response.    She reports of feeling well overall. She denies any nausea/vomiting/fever or chills/emergency room visits/hospitalizations. He reports continued out lateral hand tingling and numbness that is  stable.  He denies any chest pain, palpitations, worsening fatigue.  She has lost her hair. She only has some minimal dyspnea on exertion and is not using any oxygen. She denies any pain. She denied cough. She denied a mucositis or night sweats.  MEDICAL HISTORY: Past Medical History  Diagnosis Date  . Hyperlipidemia   . Nerve pain     Left leg  . Seasonal allergies   . Acute bronchitis   . Colon polyps 02/2013    Per colonoscopy  . Diverticulosis 02/2013    Per colonoscopy  . Former light tobacco smoker   . Dysrhythmia   . Cancer     lung and kidney  . Anaplastic large cell lymphoma   . Asthma   . GERD (gastroesophageal reflux disease)   . Diabetes mellitus without complication     INTERIM HISTORY: has GERD (gastroesophageal reflux disease); Shortness of breath; Atrial fibrillation with RVR; Bilateral pneumonia; Acute renal insufficiency; Normocytic anemia; Other malignant lymphomas, unspecified site, extranodal and solid organ sites; Tumor lysis syndrome; Hyperglycemia without ketosis; Acute renal failure; Acute encephalopathy; Hyperkalemia; Neuropathy due to chemotherapeutic drug; Inguinal adenopathy; and Breast mass, left on her problem list.    ALLERGIES:  is allergic to iodine and shellfish-derived products.  MEDICATIONS: has a current medication list which includes the following prescription(s): albuterol, allopurinol, amlodipine, ibuprofen, insulin glargine, lansoprazole, lidocaine-prilocaine, metformin, metoprolol tartrate, montelukast, prochlorperazine, and rosuvastatin, and the following Facility-Administered Medications: sodium chloride.  SURGICAL HISTORY:  Past Surgical History  Procedure Laterality Date  . Partial hysterectomy    . Bones removed from baby toes    . Colonoscopy  02/16/2012    Procedure: COLONOSCOPY;  Surgeon: Molly Maduro  Sonnie Alamo, MD;  Location: [facility];  Service: Endoscopy;  Laterality: N/A;  1:30  . Abdominal hysterectomy    . Video bronchoscopy with  endobronchial ultrasound N/A 05/13/2013    Procedure: VIDEO BRONCHOSCOPY WITH ENDOBRONCHIAL ULTRASOUND;  Surgeon: Loreli Slot, MD;  Location: [facility] OR;  Service: Thoracic;  Laterality: N/A;  . Mediastinoscopy N/A 05/13/2013    Procedure: MEDIASTINOSCOPY;  Surgeon: Loreli Slot, MD;  Location: [facility] OR;  Service: Thoracic;  Laterality: N/A;   PROBLEM LIST:  1. Anaplastic large-cell lymphoma, CD 30 positive, ALK positive, CD 20, negative, high grade, stage IV with involvement of mediastinal lymph nodes from which biopsy was obtained on 05/13/2013 by Viviann Spare C. Dorris Fetch, M.D. There is also involvement of the lungs with multiple lung nodules, liver and bilateral inguinal lymph nodes,  the latter seen on PET scan from 05/30/2013. Bone marrow on 05/18/2013 was negative. IPI score was 2. Histologic diagnosis was made on 05/13/2013 by mediastinal lymph node biopsy. Chemotherapy with Cytoxan, Adriamycin, vincristine, etoposide and prednisone in conjunction with Neulasta was initiated on 05/20/2013.  2. Brief episode of atrial fibrillation with rapid ventricular  response in late May 2014 at time of lymphoma presentation.  3. Dyslipidemia.  4. Seasonal allergies.  5. Diverticulosis.  6. Status post hysterectomy.  7. Right-sided Port-A-Cath placed on 05/23/2013.  8. Hyperglycemia/Diabetes Mellitus following cycle #5.   ONCOLOGY TIMELINE: *05/10/2013.  The patient was found to have extensive pulmonary nodules on a chest CT scan.  *05/13/2013. Video bronchoscopy with endobronchial ultrasound and mediastinoscopy by Salvatore Decent. Hendrickson,  mediastinal biopsies showed anaplastic large-cell lymphoma, CD 30 and ALK positive. CD 20 negative. Tumor was high-grade stage IV with an IPI score of 2.   *05/18/2013. Bone marrow aspirate and biopsy on  was negative for lymphoma.   *05/20/2013.Chemotherapy with CHOEP regimen was started with slightly decreased dose due to profound bone marrow suppression.      *05/23/2013. Port-A-Cath placed via interventional radiology on the right side on 05/23/2013.   *05/30/2013.  PET scan following cycle #1 shows only uptake in bilateral inguinal lymph nodes that are not really significantly enlarged. The patient has had a striking regression of tumor in her lungs, at least 90%. There has also been a decrease in the liver masses. None of those areas showed increased uptake, interestingly enough.   REVIEW OF SYSTEMS:   Constitutional: Denies fevers, chills or abnormal weight loss Eyes: Denies blurriness of vision Ears, nose, mouth, throat, and face: Denies mucositis or sore throat Respiratory: Denies cough, dyspnea or wheezes Cardiovascular: Denies palpitation, chest discomfort or lower extremity swelling Gastrointestinal:  Denies nausea, heartburn or change in bowel habits Skin: Denies abnormal skin rashes Lymphatics: Denies new lymphadenopathy or easy bruising Neurological:Denies numbness, tingling or new weaknesses Behavioral/Psych: Mood is stable, no new changes  All other systems were reviewed with the patient and are negative.  PHYSICAL EXAMINATION: ECOG PERFORMANCE STATUS: 0 - Asymptomatic  Blood pressure 141/89, pulse 78, temperature 98.2 F (36.8 C), temperature source Oral, resp. rate 19, height 5\' 8"  (1.727 m), weight 210 lb 3.2 oz (95.346 kg), SpO2 100.00%.  GENERAL:alert, no distress and comfortable; Alopecia SKIN: skin color, texture, turgor are normal, no rashes or significant lesions; R port a cath in place.   EYES: normal, Conjunctiva are pink and non-injected, sclera clear OROPHARYNX:no exudate, no erythema and lips, buccal mucosa, and tongue normal  NECK: supple, thyroid normal size, non-tender, without nodularity LYMPH:  no palpable lymphadenopathy in the cervical, axillary or supraclavicular  LUNGS: clear to auscultation and percussion with normal breathing effort HEART: regular rate & rhythm and no murmurs and no lower extremity  edema ABDOMEN:abdomen soft, non-tender and normal bowel sounds Musculoskeletal:no cyanosis of digits and no clubbing; darkening of both hands bilaterally with some dry skin.    NEURO: alert & oriented x 3 with fluent speech, no focal motor/sensory deficits   Labs:  Lab Results  Component Value Date   WBC 5.2 11/04/2013   HGB 11.5* 11/04/2013   HCT 36.8 11/04/2013   MCV 85.0 11/04/2013   PLT 247 11/04/2013   NEUTROABS 3.3 11/04/2013      Chemistry      Component Value Date/Time   NA 145 11/04/2013 0943   NA 142 08/29/2013 0530   K 3.7 11/04/2013 0943   K 3.6 08/29/2013 0530   CL 111 08/29/2013 0530   CO2 22 11/04/2013 0943   CO2 24 08/29/2013 0530   BUN 15.0 11/04/2013 0943   BUN 6 08/29/2013 0530   CREATININE 0.8 11/04/2013 0943   CREATININE 0.76 08/29/2013 0530      Component Value Date/Time   CALCIUM 9.8 11/04/2013 0943   CALCIUM 9.0 08/29/2013 0530   ALKPHOS 73 11/04/2013 0943   ALKPHOS 96 05/24/2013 0510   AST 19 11/04/2013 0943   AST 25 05/24/2013 0510   ALT 22 11/04/2013 0943   ALT 75* 05/24/2013 0510   BILITOT 0.38 11/04/2013 0943   BILITOT 0.3 05/24/2013 0510     Basic Metabolic Panel:  Recent Labs Lab 11/04/13 0943  NA 145  K 3.7  CO2 22  GLUCOSE 96  BUN 15.0  CREATININE 0.8  CALCIUM 9.8   GFR Estimated Creatinine Clearance: 100.5 ml/min (by C-G formula based on Cr of 0.8). Liver Function Tests:  Recent Labs Lab 11/04/13 0943  AST 19  ALT 22  ALKPHOS 73  BILITOT 0.38  PROT 7.2  ALBUMIN 4.0   CBC:  Recent Labs Lab 11/04/13 0942  WBC 5.2  NEUTROABS 3.3  HGB 11.5*  HCT 36.8  MCV 85.0  PLT 247   RADIOGRAPHIC STUDIES:  (This study was personally reviewed by me) Nm Pet Image Restag (ps) Skull Base To Thigh  09/26/2013   CLINICAL DATA:  Subsequent treatment strategy for lymphoma.  EXAM: NUCLEAR MEDICINE PET SKULL BASE TO THIGH  FASTING BLOOD GLUCOSE:  Value:  58 mg/dl  TECHNIQUE: 16.1 mCi W-96 FDG was injected intravenously. CT data was  obtained and used for attenuation correction and anatomic localization only. (This was not acquired as a diagnostic CT examination.) Additional exam technical data entered on technologist worksheet.  COMPARISON:  05/30/2013 and CT chest 05/10/2013.  FINDINGS: NECK  No hypermetabolic lymph nodes in the neck. CT images show no acute findings.  CHEST  A 9 x 11 mm soft tissue nodule in the lateral left breast has an SUV max of 2.2. Skin thickening in the left axillary region has an SUV max of 3.7.  Mid and distal portion of the esophagus appear hypermetabolic, with SUV max of 12.6.  Confluent soft tissue in the infrahilar aspect of the superior segment right lower lobe measures approximately 1.4 x 2.1 cm with an SUV max of 4.2. Associated volume loss in the right lower lobe is hypermetabolic as well.  CT images show no acute findings. No pericardial or pleural effusion.  ABDOMEN/PELVIS  Mildly hypermetabolic external iliac and inguinal lymph nodes measure up to 10 mm on the left, with an SUV max of 4.6. No additional areas  of abnormal hypermetabolism in the abdomen or pelvis. CT images show no acute findings.  SKELETON  No focal hypermetabolic activity to suggest skeletal metastasis.  IMPRESSION: 1. Mildly hypermetabolic bilateral external iliac and inguinal lymph nodes, as before. 2. Small area of residual hypermetabolic confluent soft tissue in the infrahilar right lower lobe with associated volume loss in the right lower lobe. Overall improved appearance of metastatic disease in the lungs. 3. Mildly hypermetabolic skin thickening in the left axillary region with a mildly hypermetabolic soft tissue nodule in the lateral left breast. Please correlate clinically with physical exam and mammogram, as clinically indicated. 4. Hypermetabolism involving the mid and distal portions of the esophagus. Findings are new from 05/30/2013 and may relate to esophagitis   Electronically Signed   By: Leanna Battles M.D.   On:  09/26/2013 14:34   ASSESSMENT: Laurier Nancy 52 y.o. female with a history of Neuropathy due to chemotherapeutic drug - Plan: heparin lock flush 100 unit/mL, sodium chloride 0.9 % injection 10 mL  Other malignant lymphomas, unspecified site, extranodal and solid organ sites - Plan: CBC with Differential, Comprehensive metabolic panel, heparin lock flush 100 unit/mL, sodium chloride 0.9 % injection 10 mL  PLAN:  1. Stage IV Anaplastic Large-cell lymphoma (ALCL) CD30+, ALK+, CD20(-). --Last visit, we reviewed in depth her PET Scan done on 09/26/2013 following 6 cycles of CHOEP (Absent Prednisone for cycle #6).The above findings does not suggest a complete response to her therapy. In addition, given the findings of her esophagus (although this could be esophagitis) it could also represent progressive disease or partial response.  So I recommended referral to [facility] for consideration of stem cell transplant.  She was seen by Dr. Alphonsa Gin on 10/05/2013 at 11 am.    -- I discussed the plans with Dr. Alphonsa Gin of [facility]. They reviewed her recent scan and recommends to biopsy breast mass and one of the lymph nodes of the inguinal area (one that is most PET avid). Plans will be according to one of the 3 options based on the results of the biopsies including the following: 1) If Alk positive in both biopsy, perform 2-3 cycles of ICE or 2 cycles of Brentuximab( preferred by [facility]) or if 2) biopsy of the breast reveals a primary breast oncology, she will require a referral to breast oncology and see if treatment for primary breast could be delayed or 3) if node negative for both ALK lymphoma, proceed directly to BMT. She has been referred to surgery for consideration of an inguinal lymph node excisional biopsy and to breast clinic for mammogram with biopsy.   --She had mammogram and breast clinic determined the breast nodule was benign in comparison to prior scans.  Biopsy of breast was  unwarranted.  She was scheduled for a inguinal lymph biopsy but was unable pay the co-payment and it was cancelled.  We contacted Dr. Alphonsa Gin of [facility] who is in agreement of having this done there.  They will call the patient to facilitate an expedited appointment.     2. Anemia secondary to chemotherapy. --Her counts including her WBC, Plts have recovered.  She does not have any symptoms of anemia presently.  Her hemoglobin continues to improve.   3. Neuropathy secondary to chemotherapy.  --Tingling in hands is stable.  She can button her shirts without difficulty.   4. 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 --Patient is following with opthalmology for further evaluation and management.   5. Follow-up. --Patient should  follow up in 4 weeks with CMP, CBC.    All questions were answered. The patient knows to call the clinic with any problems, questions or concerns. We can certainly see the patient much sooner if necessary.  I spent 25 minutes counseling the patient face to face. The total time spent in the appointment was 40 minutes.    Yanisa Goodgame, MD 11/04/2013 10:57 AM

## 2013-11-04 NOTE — Telephone Encounter (Signed)
I called pt and left her a message regarding her allopurinol. She had asked this am at her MD visit if she needed to get this refilled. Per Dr. Rosie Fate she can stop it for now. I asked her to call me if any questions or concerns.

## 2013-11-04 NOTE — Telephone Encounter (Signed)
gv pt appt schedule for december 2014 thru march 2015.

## 2013-11-28 ENCOUNTER — Telehealth: Payer: Self-pay | Admitting: Medical Oncology

## 2013-11-28 NOTE — Telephone Encounter (Signed)
Pt called to let us know that she had her biopsy done at [facility] on 11/24/2013. Dr.Chism will be notified.

## 2013-12-01 ENCOUNTER — Encounter: Payer: Self-pay | Admitting: Internal Medicine

## 2013-12-01 ENCOUNTER — Ambulatory Visit (HOSPITAL_BASED_OUTPATIENT_CLINIC_OR_DEPARTMENT_OTHER): Payer: PRIVATE HEALTH INSURANCE | Admitting: Internal Medicine

## 2013-12-01 ENCOUNTER — Other Ambulatory Visit (HOSPITAL_BASED_OUTPATIENT_CLINIC_OR_DEPARTMENT_OTHER): Payer: PRIVATE HEALTH INSURANCE

## 2013-12-01 VITALS — BP 119/75 | HR 60 | Temp 97.5°F | Resp 18 | Ht 68.0 in | Wt 216.8 lb

## 2013-12-01 DIAGNOSIS — C8589 Other specified types of non-Hodgkin lymphoma, extranodal and solid organ sites: Secondary | ICD-10-CM

## 2013-12-01 DIAGNOSIS — T451X5A Adverse effect of antineoplastic and immunosuppressive drugs, initial encounter: Secondary | ICD-10-CM

## 2013-12-01 DIAGNOSIS — G620 Drug-induced polyneuropathy: Secondary | ICD-10-CM

## 2013-12-01 DIAGNOSIS — D6481 Anemia due to antineoplastic chemotherapy: Secondary | ICD-10-CM

## 2013-12-01 DIAGNOSIS — E119 Type 2 diabetes mellitus without complications: Secondary | ICD-10-CM

## 2013-12-01 LAB — COMPREHENSIVE METABOLIC PANEL (CC13)
ALT: 13 U/L (ref 0–55)
AST: 15 U/L (ref 5–34)
Albumin: 3.7 g/dL (ref 3.5–5.0)
Alkaline Phosphatase: 75 U/L (ref 40–150)
Anion Gap: 9 mEq/L (ref 3–11)
BUN: 12.3 mg/dL (ref 7.0–26.0)
CO2: 26 mEq/L (ref 22–29)
Calcium: 9.8 mg/dL (ref 8.4–10.4)
Chloride: 111 mEq/L — ABNORMAL HIGH (ref 98–109)
Creatinine: 1 mg/dL (ref 0.6–1.1)
Glucose: 111 mg/dl (ref 70–140)
Potassium: 3.9 mEq/L (ref 3.5–5.1)
Sodium: 147 mEq/L — ABNORMAL HIGH (ref 136–145)
Total Bilirubin: 0.34 mg/dL (ref 0.20–1.20)
Total Protein: 6.9 g/dL (ref 6.4–8.3)

## 2013-12-01 LAB — CBC WITH DIFFERENTIAL/PLATELET
BASO%: 0.4 % (ref 0.0–2.0)
Basophils Absolute: 0 10*3/uL (ref 0.0–0.1)
EOS%: 1.8 % (ref 0.0–7.0)
Eosinophils Absolute: 0.1 10*3/uL (ref 0.0–0.5)
HCT: 38 % (ref 34.8–46.6)
HGB: 12 g/dL (ref 11.6–15.9)
LYMPH%: 31.9 % (ref 14.0–49.7)
MCH: 26 pg (ref 25.1–34.0)
MCHC: 31.6 g/dL (ref 31.5–36.0)
MCV: 82.4 fL (ref 79.5–101.0)
MONO#: 0.3 10*3/uL (ref 0.1–0.9)
MONO%: 6.2 % (ref 0.0–14.0)
NEUT#: 3.1 10*3/uL (ref 1.5–6.5)
NEUT%: 59.7 % (ref 38.4–76.8)
Platelets: 180 10*3/uL (ref 145–400)
RBC: 4.61 10*6/uL (ref 3.70–5.45)
RDW: 14.9 % — ABNORMAL HIGH (ref 11.2–14.5)
WBC: 5.1 10*3/uL (ref 3.9–10.3)
lymph#: 1.6 10*3/uL (ref 0.9–3.3)
nRBC: 0 % (ref 0–0)

## 2013-12-01 NOTE — Patient Instructions (Signed)
1. Proceed to [facility] for follow-up regarding transplant base on negative inguinal lymph node.   2. Follow up with me based on completion of transplant.

## 2013-12-01 NOTE — Progress Notes (Signed)
[facility] OFFICE PROGRESS NOTE  Megan Mcknight,[name], MD [address] [place] [address]  DIAGNOSIS: Other malignant lymphomas, unspecified site, extranodal and solid organ sites  Neuropathy due to chemotherapeutic drug  Chief Complaint  Patient presents with  . Other malignant lymphomas, unspecified site, extranodal and    CURRENT THERAPY: CHOEP q 21 days started on 05/20/2013.  Completed 6 of 6 planned cycles for CHOEP on 09/05/2013.  Cycle #5 was complicated by ICU-level care secondary to severe hyperglycemia (see prior note for details).  Cycle #6, prednisone was excluded.  She received neulasta throughout her therapy.   INTERVAL HISTORY: Megan Mcknight 52 y.o. 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 She was last seen by me on 11/04/2013.  Today, she is accompanied by her husband.  She had a mammogram to evaluate a right breast mass which was negative for malignancy.   She was referred to surgery for lymph node biopsy and completed it on 11/24/2013 without difficulty.  Pathology negative for malignancy.    She had a repeat PET scan done on 09/26/2013 which was consistent with a partial response.    She reports of feeling well overall. She denies any nausea/vomiting/fever or chills/emergency room visits/hospitalizations. He reports continued out lateral hand tingling and numbness that is stable.  He denies any chest pain, palpitations, worsening fatigue.  She has lost her hair. She only has some minimal dyspnea on exertion and is not using any oxygen. She denies any pain. She denied cough. She denied a mucositis or night sweats.  MEDICAL HISTORY: Past Medical History  Diagnosis Date  . Hyperlipidemia   . Nerve pain     Left leg  . Seasonal allergies   . Acute bronchitis   . Colon polyps 02/2013    Per colonoscopy  . Diverticulosis 02/2013    Per colonoscopy  . Former light tobacco  smoker   . Dysrhythmia   . Cancer     lung and kidney  . Anaplastic large cell lymphoma   . Asthma   . GERD (gastroesophageal reflux disease)   . Diabetes mellitus without complication     INTERIM HISTORY: has GERD (gastroesophageal reflux disease); Shortness of breath; Atrial fibrillation with RVR; Bilateral pneumonia; Acute renal insufficiency; Normocytic anemia; Other malignant lymphomas, unspecified site, extranodal and solid organ sites; Tumor lysis syndrome; Hyperglycemia without ketosis; Acute renal failure; Acute encephalopathy; Hyperkalemia; Neuropathy due to chemotherapeutic drug; Inguinal adenopathy; and Breast mass, left on her problem list.    ALLERGIES:  is allergic to iodine and shellfish-derived products.  MEDICATIONS: has a current medication list which includes the following prescription(s): albuterol, amlodipine, ibuprofen, insulin glargine, lansoprazole, lidocaine-prilocaine, metformin, metoprolol tartrate, montelukast, prochlorperazine, and rosuvastatin.  SURGICAL HISTORY:  Past Surgical History  Procedure Laterality Date  . Partial hysterectomy    . Bones removed from baby toes    . Colonoscopy  02/16/2012    Procedure: COLONOSCOPY;  Surgeon: Megan Ade, MD;  Location[facility];  Service: Endoscopy;  Laterality: N/A;  1:30  . Abdominal hysterectomy    . Video bronchoscopy with endobronchial ultrasound N/A 05/13/2013    Procedure: VIDEO BRONCHOSCOPY WITH ENDOBRONCHIAL ULTRASOUND;  Surgeon: Megan Slot, MD;  Location: [facility] OR;  Service: Thoracic;  Laterality: N/A;  . Mediastinoscopy N/A 05/13/2013    Procedure: MEDIASTINOSCOPY;  Surgeon: Megan Slot, MD;  Location: [facility] OR;  Service: Thoracic;  Laterality: N/A;   PROBLEM  LIST:  1. Anaplastic large-cell lymphoma, CD 30 positive, ALK positive, CD 20, negative, high grade, stage IV with involvement of mediastinal lymph nodes from which biopsy was obtained on 05/13/2013 by Megan Mcknight, M.D.  There is also involvement of the lungs with multiple lung nodules, liver and bilateral inguinal lymph nodes,  the latter seen on PET scan from 05/30/2013. Bone marrow on 05/18/2013 was negative. IPI score was 2. Histologic diagnosis was made on 05/13/2013 by mediastinal lymph node biopsy. Chemotherapy with Cytoxan, Adriamycin, vincristine, etoposide and prednisone in conjunction with Neulasta was initiated on 05/20/2013.  2. Brief episode of atrial fibrillation with rapid ventricular  response in late May 2014 at time of lymphoma presentation.  3. Dyslipidemia.  4. Seasonal allergies.  5. Diverticulosis.  6. Status post hysterectomy.  7. Right-sided Port-A-Cath placed on 05/23/2013.  8. Hyperglycemia/Diabetes Mellitus following cycle #5.   ONCOLOGY TIMELINE: *05/10/2013.  The patient was found to have extensive pulmonary nodules on a chest CT scan.  *05/13/2013. Video bronchoscopy with endobronchial ultrasound and mediastinoscopy by Megan Mcknight,  mediastinal biopsies showed anaplastic large-cell lymphoma, CD 30 and ALK positive. CD 20 negative. Tumor was high-grade stage IV with an IPI score of 2.   *05/18/2013. Bone marrow aspirate and biopsy on  was negative for lymphoma.   *05/20/2013.Chemotherapy with CHOEP regimen was started with slightly decreased dose due to profound bone marrow suppression.     *05/23/2013. Port-A-Cath placed via interventional radiology on the right side on 05/23/2013.   *05/30/2013.  PET scan following cycle #1 shows only uptake in bilateral inguinal lymph nodes that are not really significantly enlarged. The patient has had a striking regression of tumor in her lungs, at least 90%. There has also been a decrease in the liver masses. None of those areas showed increased uptake, interestingly enough.   REVIEW OF SYSTEMS:   Constitutional: Denies fevers, chills or abnormal weight loss Eyes: Denies blurriness of vision Ears, nose, mouth, throat, and  face: Denies mucositis or sore throat Respiratory: Denies cough, dyspnea or wheezes Cardiovascular: Denies palpitation, chest discomfort or lower extremity swelling Gastrointestinal:  Denies nausea, heartburn or change in bowel habits Skin: Denies abnormal skin rashes Lymphatics: Denies new lymphadenopathy or easy bruising Neurological:Denies numbness, tingling or new weaknesses Behavioral/Psych: Mood is stable, no new changes  All other systems were reviewed with the patient and are negative.  PHYSICAL EXAMINATION: ECOG PERFORMANCE STATUS: 0 - Asymptomatic  Blood pressure 119/75, pulse 60, temperature 97.5 F (36.4 C), temperature source Oral, resp. rate 18, height 5\' 8"  (1.727 m), weight 216 lb 12.8 oz (98.34 kg).  GENERAL:alert, no distress and comfortable; Alopecia SKIN: skin color, texture, turgor are normal, no rashes or significant lesions; R port a cath in place.   EYES: normal, Conjunctiva are pink and non-injected, sclera clear OROPHARYNX:no exudate, no erythema and lips, buccal mucosa, and tongue normal  NECK: supple, thyroid normal size, non-tender, without nodularity LYMPH:  no palpable lymphadenopathy in the cervical, axillary or supraclavicular; s/p left inguinal biopsy healed.  LUNGS: clear to auscultation and percussion with normal breathing effort HEART: regular rate & rhythm and no murmurs and no lower extremity edema ABDOMEN:abdomen soft, non-tender and normal bowel sounds Musculoskeletal:no cyanosis of digits and no clubbing; darkening of both hands bilaterally with some dry skin.    NEURO: alert & oriented x 3 with fluent speech, no focal motor/sensory deficits   Labs:  Lab Results  Component Value Date   WBC 5.1 12/01/2013   HGB 12.0  12/01/2013   HCT 38.0 12/01/2013   MCV 82.4 12/01/2013   PLT 180 12/01/2013   NEUTROABS 3.1 12/01/2013      Chemistry      Component Value Date/Time   NA 147* 12/01/2013 0909   NA 142 08/29/2013 0530   K 3.9 12/01/2013  0909   K 3.6 08/29/2013 0530   CL 111 08/29/2013 0530   CO2 26 12/01/2013 0909   CO2 24 08/29/2013 0530   BUN 12.3 12/01/2013 0909   BUN 6 08/29/2013 0530   CREATININE 1.0 12/01/2013 0909   CREATININE 0.76 08/29/2013 0530      Component Value Date/Time   CALCIUM 9.8 12/01/2013 0909   CALCIUM 9.0 08/29/2013 0530   ALKPHOS 75 12/01/2013 0909   ALKPHOS 96 05/24/2013 0510   AST 15 12/01/2013 0909   AST 25 05/24/2013 0510   ALT 13 12/01/2013 0909   ALT 75* 05/24/2013 0510   BILITOT 0.34 12/01/2013 0909   BILITOT 0.3 05/24/2013 0510     Basic Metabolic Panel:  Recent Labs Lab 12/01/13 0909  NA 147*  K 3.9  CO2 26  GLUCOSE 111  BUN 12.3  CREATININE 1.0  CALCIUM 9.8   GFR Estimated Creatinine Clearance: 80.7 ml/min (by C-G formula based on Cr of 1). Liver Function Tests:  Recent Labs Lab 12/01/13 0909  AST 15  ALT 13  ALKPHOS 75  BILITOT 0.34  PROT 6.9  ALBUMIN 3.7   CBC:  Recent Labs Lab 12/01/13 0908  WBC 5.1  NEUTROABS 3.1  HGB 12.0  HCT 38.0  MCV 82.4  PLT 180   RADIOGRAPHIC STUDIES:  (This study was personally reviewed by me) Nm Pet Image Restag (ps) Skull Base To Thigh  09/26/2013   CLINICAL DATA:  Subsequent treatment strategy for lymphoma.  EXAM: NUCLEAR MEDICINE PET SKULL BASE TO THIGH  FASTING BLOOD GLUCOSE:  Value:  58 mg/dl  TECHNIQUE: 45.4 mCi U-98 FDG was injected intravenously. CT data was obtained and used for attenuation correction and anatomic localization only. (This was not acquired as a diagnostic CT examination.) Additional exam technical data entered on technologist worksheet.  COMPARISON:  05/30/2013 and CT chest 05/10/2013.  FINDINGS: NECK  No hypermetabolic lymph nodes in the neck. CT images show no acute findings.  CHEST  A 9 x 11 mm soft tissue nodule in the lateral left breast has an SUV max of 2.2. Skin thickening in the left axillary region has an SUV max of 3.7.  Mid and distal portion of the esophagus appear hypermetabolic, with SUV max  of 12.6.  Confluent soft tissue in the infrahilar aspect of the superior segment right lower lobe measures approximately 1.4 x 2.1 cm with an SUV max of 4.2. Associated volume loss in the right lower lobe is hypermetabolic as well.  CT images show no acute findings. No pericardial or pleural effusion.  ABDOMEN/PELVIS  Mildly hypermetabolic external iliac and inguinal lymph nodes measure up to 10 mm on the left, with an SUV max of 4.6. No additional areas of abnormal hypermetabolism in the abdomen or pelvis. CT images show no acute findings.  SKELETON  No focal hypermetabolic activity to suggest skeletal metastasis.  IMPRESSION: 1. Mildly hypermetabolic bilateral external iliac and inguinal lymph nodes, as before. 2. Small area of residual hypermetabolic confluent soft tissue in the infrahilar right lower lobe with associated volume loss in the right lower lobe. Overall improved appearance of metastatic disease in the lungs. 3. Mildly hypermetabolic skin thickening in the left axillary  region with a mildly hypermetabolic soft tissue nodule in the lateral left breast. Please correlate clinically with physical exam and mammogram, as clinically indicated. 4. Hypermetabolism involving the mid and distal portions of the esophagus. Findings are new from 05/30/2013 and may relate to esophagitis   Electronically Signed   By: Leanna Battles M.D.   On: 09/26/2013 14:34   PATHOLOGY: NUMBER:  [id number] RECEIVED: 11/24/2013 ORDERING PHYSICIAN:  MYRON SHEAVICTOR Lowell Guitar , MD PATIENT NAME:  Megan Mcknight SURGICAL PATHOLOGY REPORT  FINAL PATHOLOGIC DIAGNOSIS MICROSCOPIC EXAMINATION AND DIAGNOSIS  LEFT INGUINAL LYMPH NODE, EXCISIONAL BIOPSY:      Reactive lymph node with sinus histiocytosis and dermatopathic changes.      Negative for lymphoma.      See comment.  COMMENT: Examination of the tissue reveals a reactive lymph node with sinus histiocytosis and focal dermatopathic change. Immunohistochemistry for ALK-1  protein and CD30 are negative.  The immunohistochemical controls worked appropriately.   "These tests were developed and their performance characteristics determined by [facility].  They have not been cleared or approved by the U.S. Food and Drug Administration.  The FDA has determined that such clearance or approval is not necessary.  These tests are used for clinical purposes.  They should not be regarded as investigational or for research.  This laboratory is certified under the Clinical Laboratory Improvement Amendments of 1988 (CLIA) as qualified to perform high complexity clinical laboratory testing."   I have personally reviewed the slides and/or other related materials referenced, and have edited the report as part of my pathologic assessment and final interpretation.  Electronically Signed Out By:   Cornelia Copa, M.D., Pathology 11/28/2013 17:04:10  [id number]  Specimen(s) Received  Left inguinal lymph node biopsy   Clinical History History of ALK positive ALCL     Gross Description Received labeled "left inguinal lymph node biopsy" are 2 tan yellow soft tissue fragments measuring 4 x 3 x 1 cm in aggregate. The specimen is serially sectioned to reveal 2 lymph node candidates measuring 0.5 and 0.8 cm in greatest dimension. 6 unstained touch prep slides are prepared, a representative section is submitted for flow cytometry analysis, a representative section is submitted in A1 in the B + solution. The remaining lymph nodes are entirely submitted in A2-A3.  Budd Palmer, M.D., RESIDE    ASSESSMENT: Megan Mcknight 52 y.o. female with a history of Other malignant lymphomas, unspecified site, extranodal and solid organ sites  Neuropathy due to chemotherapeutic drug  PLAN:  1. Stage IV Anaplastic Large-cell lymphoma (ALCL) CD30+, ALK+, CD20(-). --Last visit, we reviewed in depth her PET Scan done on 09/26/2013  following 6 cycles of CHOEP (Absent Prednisone for cycle #6).The above findings does not suggest a complete response to her therapy. In addition, given the findings of her esophagus (although this could be esophagitis) it could also represent progressive disease or partial response.  So I recommended referral to [facility] for consideration of stem cell transplant.  She was seen by Dr. Alphonsa Gin on 10/05/2013 at 11 am.    -- I discussed the plans with Dr. Alphonsa Gin of [facility]. They reviewed her recent scan and recommends to biopsy breast mass and one of the lymph nodes of the inguinal area (one that is most PET avid). Plans will be according to one of the 3 options based on the results of the biopsies including the following: 1) If Alk positive in both biopsy, perform 2-3  cycles of ICE or 2 cycles of Brentuximab( preferred by [facility]) or if 2) biopsy of the breast reveals a primary breast oncology, she will require a referral to breast oncology and see if treatment for primary breast could be delayed or 3) if node negative for both ALK lymphoma, proceed directly to BMT. She has been referred to surgery for consideration of an inguinal lymph node excisional biopsy and to breast clinic for mammogram with biopsy.    --She had mammogram and breast clinic determined the breast nodule was benign in comparison to prior scans.  Biopsy of breast was unwarranted.  She had the left inguinal lymph node biopsied and showed no evidence of malignancy as noted above.  She has a planned follow-up with Dr. Alphonsa Gin on 12/13/2013.     2. Anemia secondary to chemotherapy. --Her counts including her WBC, Plts have recovered.  She does not have any symptoms of anemia presently.  Her hemoglobin continues to improve.   3. Neuropathy secondary to chemotherapy.  --Tingling in hands is stable.  She can button her shirts without difficulty.   4. 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 --Patient is following with  opthalmology for further evaluation and management.   5. Follow-up. --Patient should follow up prn s/p transplantation.   All questions were answered. The patient knows to call the clinic with any problems, questions or concerns. We can certainly see the patient much sooner if necessary.  I spent 15 minutes counseling the patient face to face. The total time spent in the appointment was 25 minutes.    Kitana Gage, MD 12/01/2013 9:54 AM

## 2013-12-15 DIAGNOSIS — Z9484 Stem cells transplant status: Secondary | ICD-10-CM

## 2013-12-15 HISTORY — DX: Stem cells transplant status: Z94.84

## 2013-12-21 ENCOUNTER — Other Ambulatory Visit: Payer: Self-pay | Admitting: Internal Medicine

## 2013-12-21 DIAGNOSIS — C8589 Other specified types of non-Hodgkin lymphoma, extranodal and solid organ sites: Secondary | ICD-10-CM

## 2013-12-26 DIAGNOSIS — Z0271 Encounter for disability determination: Secondary | ICD-10-CM

## 2013-12-27 ENCOUNTER — Other Ambulatory Visit: Payer: Self-pay

## 2013-12-28 ENCOUNTER — Telehealth: Payer: Self-pay

## 2013-12-28 ENCOUNTER — Ambulatory Visit (HOSPITAL_COMMUNITY)
Admission: RE | Admit: 2013-12-28 | Discharge: 2013-12-28 | Disposition: A | Discharge status: Home / Self Care | Payer: PRIVATE HEALTH INSURANCE | Source: Ambulatory Visit | Attending: Internal Medicine | Admitting: Internal Medicine

## 2013-12-28 ENCOUNTER — Encounter (HOSPITAL_COMMUNITY): Payer: Self-pay

## 2013-12-28 DIAGNOSIS — C8589 Other specified types of non-Hodgkin lymphoma, extranodal and solid organ sites: Secondary | ICD-10-CM

## 2013-12-28 LAB — GLUCOSE, CAPILLARY: Glucose-Capillary: 92 mg/dL (ref 70–99)

## 2013-12-28 MED ORDER — FLUDEOXYGLUCOSE F - 18 (FDG) INJECTION
18.9000 | Freq: Once | INTRAVENOUS | Status: AC | PRN
  Administered 2013-12-28: 18.9 via INTRAVENOUS

## 2013-12-28 MED ORDER — FLUDEOXYGLUCOSE F - 18 (FDG) INJECTION: 18.9000 | Freq: Once | INTRAVENOUS | Status: AC | PRN

## 2013-12-28 NOTE — Telephone Encounter (Signed)
Pt called to cancel 1/16 flush appt. Had port flushed today with PET scan.

## 2013-12-29 ENCOUNTER — Telehealth: Payer: Self-pay | Admitting: Internal Medicine

## 2013-12-29 NOTE — Telephone Encounter (Signed)
Patient aware of PET scan results.  Looks good to proceed to transplant.

## 2013-12-30 ENCOUNTER — Telehealth: Payer: Self-pay | Admitting: Internal Medicine

## 2013-12-30 ENCOUNTER — Other Ambulatory Visit: Payer: Self-pay

## 2013-12-30 ENCOUNTER — Telehealth: Payer: Self-pay

## 2013-12-30 ENCOUNTER — Other Ambulatory Visit: Payer: Self-pay | Admitting: Internal Medicine

## 2013-12-30 NOTE — Telephone Encounter (Signed)
S/w pt that Megan Mcknight is closed Sat and Sunday. She opted to get neupogen shots here at [facility] on Sat and Monday to minimize confusion. She will get neupogen at [facility] on Sunday.

## 2013-12-31 ENCOUNTER — Ambulatory Visit (HOSPITAL_BASED_OUTPATIENT_CLINIC_OR_DEPARTMENT_OTHER): Payer: PRIVATE HEALTH INSURANCE

## 2013-12-31 ENCOUNTER — Ambulatory Visit: Payer: PRIVATE HEALTH INSURANCE

## 2013-12-31 VITALS — BP 123/82 | HR 68 | Temp 97.0°F

## 2013-12-31 DIAGNOSIS — D702 Other drug-induced agranulocytosis: Secondary | ICD-10-CM

## 2013-12-31 DIAGNOSIS — C8589 Other specified types of non-Hodgkin lymphoma, extranodal and solid organ sites: Secondary | ICD-10-CM

## 2013-12-31 MED ORDER — FILGRASTIM 480 MCG/0.8ML IJ SOLN
960.0000 ug | Freq: Once | INTRAMUSCULAR | Status: AC
  Administered 2013-12-31: 960 ug via SUBCUTANEOUS

## 2013-12-31 NOTE — Patient Instructions (Signed)
Filgrastim, G-CSF injection What is this medicine? FILGRASTIM, G-CSF (fil GRA stim) stimulates the formation of white blood cells. This medicine is given to patients with conditions that may cause a decrease in white blood cells, like those receiving certain types of chemotherapy or bone marrow transplant. It helps the bone marrow recover its ability to produce white blood cells. Increasing the amount of white blood cells helps to decrease the risk of infection and fever. This medicine may be used for other purposes; ask your health care provider or pharmacist if you have questions. COMMON BRAND NAME(S): Neupogen What should I tell my health care provider before I take this medicine? They need to know if you have any of these conditions: -currently receiving radiation therapy -sickle cell disease -an unusual or allergic reaction to filgrastim, E. coli protein, other medicines, foods, dyes, or preservatives -pregnant or trying to get pregnant -breast-feeding How should I use this medicine? This medicine is for injection into a vein or injection under the skin. It is usually given by a health care professional in a hospital or clinic setting. If you get this medicine at home, you will be taught how to prepare and give this medicine. Always change the site for the injection under the skin. Let the solution warm to room temperature before you use it. Do not shake the solution before you withdraw a dose. Throw away any unused portion. Use exactly as directed. Take your medicine at regular intervals. Do not take your medicine more often than directed. It is important that you put your used needles and syringes in a special sharps container. Do not put them in a trash can. If you do not have a sharps container, call your pharmacist or healthcare provider to get one. Talk to your pediatrician regarding the use of this medicine in children. While this medicine may be prescribed for children for selected  conditions, precautions do apply. Overdosage: If you think you have taken too much of this medicine contact a poison control center or emergency room at once. NOTE: This medicine is only for you. Do not share this medicine with others. What if I miss a dose? Try not to miss doses. If you miss a dose take the dose as soon as you remember. If it is almost time for the next dose, do not take double doses unless told to by your doctor or health care professional. What may interact with this medicine? -lithium -medicines for cancer chemotherapy This list may not describe all possible interactions. Give your health care provider a list of all the medicines, herbs, non-prescription drugs, or dietary supplements you use. Also tell them if you smoke, drink alcohol, or use illegal drugs. Some items may interact with your medicine. What should I watch for while using this medicine? Visit your doctor or health care professional for regular checks on your progress. If you get a fever or any sign of infection while you are using this medicine, do not treat yourself. Check with your doctor or health care professional. Bone pain can usually be relieved by mild pain relievers such as acetaminophen or ibuprofen. Check with your doctor or health care professional before taking these medicines as they may hide a fever. Call your doctor or health care professional if the aches and pains are severe or do not go away. What side effects may I notice from receiving this medicine? Side effects that you should report to your doctor or health care professional as soon as possible: -allergic reactions  like skin rash, itching or hives, swelling of the face, lips, or tongue -difficulty breathing, wheezing -fever -pain, redness, or swelling at the injection site -stomach or side pain, or pain at the shoulder Side effects that usually do not require medical attention (report to your doctor or health care professional if they  continue or are bothersome): -bone pain (ribs, lower back, breast bone) -headache -skin rash This list may not describe all possible side effects. Call your doctor for medical advice about side effects. You may report side effects to FDA at 1-800-FDA-1088. Where should I keep my medicine? Keep out of the reach of children. Store in a refrigerator between 2 and 8 degrees C (36 and 46 degrees F). Do not freeze or leave in direct sunlight. If vials or syringes are left out of the refrigerator for more than 24 hours, they must be thrown away. Throw away unused vials after the expiration date on the carton. NOTE: This sheet is a summary. It may not cover all possible information. If you have questions about this medicine, talk to your doctor, pharmacist, or health care provider.  2014, [employer]vier/Gold Standard. (2008-02-16 13:33:21)

## 2014-01-02 ENCOUNTER — Ambulatory Visit (HOSPITAL_BASED_OUTPATIENT_CLINIC_OR_DEPARTMENT_OTHER): Payer: PRIVATE HEALTH INSURANCE

## 2014-01-02 ENCOUNTER — Ambulatory Visit: Payer: PRIVATE HEALTH INSURANCE

## 2014-01-02 VITALS — BP 140/80 | HR 86 | Temp 98.9°F

## 2014-01-02 DIAGNOSIS — C8589 Other specified types of non-Hodgkin lymphoma, extranodal and solid organ sites: Secondary | ICD-10-CM

## 2014-01-02 DIAGNOSIS — D702 Other drug-induced agranulocytosis: Secondary | ICD-10-CM

## 2014-01-02 MED ORDER — FILGRASTIM 480 MCG/0.8ML IJ SOLN
960.0000 ug | Freq: Once | INTRAMUSCULAR | Status: AC
  Administered 2014-01-02: 960 ug via SUBCUTANEOUS
  Filled 2014-01-02: qty 1.6

## 2014-02-20 ENCOUNTER — Encounter: Payer: Self-pay | Admitting: Medical Oncology

## 2014-02-20 ENCOUNTER — Other Ambulatory Visit: Payer: Self-pay | Admitting: Internal Medicine

## 2014-02-20 DIAGNOSIS — C8589 Other specified types of non-Hodgkin lymphoma, extranodal and solid organ sites: Secondary | ICD-10-CM

## 2014-02-21 ENCOUNTER — Telehealth: Payer: Self-pay | Admitting: Internal Medicine

## 2014-02-21 NOTE — Telephone Encounter (Signed)
S/w the pt and she is aware of her march 2015 appts.

## 2014-02-24 ENCOUNTER — Ambulatory Visit (HOSPITAL_BASED_OUTPATIENT_CLINIC_OR_DEPARTMENT_OTHER): Payer: PRIVATE HEALTH INSURANCE

## 2014-02-24 VITALS — BP 105/66 | HR 81 | Temp 98.0°F

## 2014-02-24 DIAGNOSIS — IMO0002 Reserved for concepts with insufficient information to code with codable children: Secondary | ICD-10-CM

## 2014-02-24 DIAGNOSIS — C8590 Non-Hodgkin lymphoma, unspecified, unspecified site: Secondary | ICD-10-CM

## 2014-02-24 DIAGNOSIS — Z452 Encounter for adjustment and management of vascular access device: Secondary | ICD-10-CM

## 2014-02-24 MED ORDER — SODIUM CHLORIDE 0.9 % IJ SOLN
10.0000 mL | INTRAMUSCULAR | Status: DC | PRN
  Administered 2014-02-24: 10 mL via INTRAVENOUS
  Filled 2014-02-24: qty 10

## 2014-02-24 MED ORDER — HEPARIN SOD (PORK) LOCK FLUSH 100 UNIT/ML IV SOLN
500.0000 [IU] | Freq: Once | INTRAVENOUS | Status: AC
  Administered 2014-02-24: 500 [IU] via INTRAVENOUS
  Filled 2014-02-24: qty 5

## 2014-02-24 NOTE — Patient Instructions (Signed)

## 2014-02-28 ENCOUNTER — Other Ambulatory Visit: Payer: Self-pay | Admitting: Internal Medicine

## 2014-02-28 DIAGNOSIS — C8589 Other specified types of non-Hodgkin lymphoma, extranodal and solid organ sites: Secondary | ICD-10-CM

## 2014-03-02 ENCOUNTER — Telehealth: Payer: Self-pay | Admitting: Internal Medicine

## 2014-03-02 ENCOUNTER — Ambulatory Visit (HOSPITAL_BASED_OUTPATIENT_CLINIC_OR_DEPARTMENT_OTHER): Payer: PRIVATE HEALTH INSURANCE | Admitting: Internal Medicine

## 2014-03-02 ENCOUNTER — Other Ambulatory Visit (HOSPITAL_BASED_OUTPATIENT_CLINIC_OR_DEPARTMENT_OTHER): Payer: PRIVATE HEALTH INSURANCE

## 2014-03-02 VITALS — BP 118/74 | HR 78 | Temp 98.1°F | Resp 19 | Ht 68.0 in | Wt 220.7 lb

## 2014-03-02 DIAGNOSIS — IMO0002 Reserved for concepts with insufficient information to code with codable children: Secondary | ICD-10-CM

## 2014-03-02 DIAGNOSIS — C8589 Other specified types of non-Hodgkin lymphoma, extranodal and solid organ sites: Secondary | ICD-10-CM

## 2014-03-02 DIAGNOSIS — G620 Drug-induced polyneuropathy: Secondary | ICD-10-CM

## 2014-03-02 DIAGNOSIS — T451X5A Adverse effect of antineoplastic and immunosuppressive drugs, initial encounter: Secondary | ICD-10-CM

## 2014-03-02 DIAGNOSIS — D6481 Anemia due to antineoplastic chemotherapy: Secondary | ICD-10-CM

## 2014-03-02 DIAGNOSIS — E119 Type 2 diabetes mellitus without complications: Secondary | ICD-10-CM

## 2014-03-02 LAB — COMPREHENSIVE METABOLIC PANEL (CC13)
ALT: 23 U/L (ref 0–55)
AST: 21 U/L (ref 5–34)
Albumin: 3.8 g/dL (ref 3.5–5.0)
Alkaline Phosphatase: 58 U/L (ref 40–150)
Anion Gap: 9 mEq/L (ref 3–11)
BUN: 12.9 mg/dL (ref 7.0–26.0)
CO2: 25 mEq/L (ref 22–29)
Calcium: 9.6 mg/dL (ref 8.4–10.4)
Chloride: 110 mEq/L — ABNORMAL HIGH (ref 98–109)
Creatinine: 0.8 mg/dL (ref 0.6–1.1)
Glucose: 81 mg/dl (ref 70–140)
Potassium: 4.2 mEq/L (ref 3.5–5.1)
Sodium: 145 mEq/L (ref 136–145)
Total Bilirubin: 0.28 mg/dL (ref 0.20–1.20)
Total Protein: 6.6 g/dL (ref 6.4–8.3)

## 2014-03-02 LAB — CBC WITH DIFFERENTIAL/PLATELET
BASO%: 1 % (ref 0.0–2.0)
Basophils Absolute: 0.1 10*3/uL (ref 0.0–0.1)
EOS%: 2.7 % (ref 0.0–7.0)
Eosinophils Absolute: 0.1 10*3/uL (ref 0.0–0.5)
HCT: 34.2 % — ABNORMAL LOW (ref 34.8–46.6)
HGB: 10.9 g/dL — ABNORMAL LOW (ref 11.6–15.9)
LYMPH%: 26.7 % (ref 14.0–49.7)
MCH: 26.1 pg (ref 25.1–34.0)
MCHC: 31.9 g/dL (ref 31.5–36.0)
MCV: 82.1 fL (ref 79.5–101.0)
MONO#: 0.5 10*3/uL (ref 0.1–0.9)
MONO%: 9.6 % (ref 0.0–14.0)
NEUT#: 3 10*3/uL (ref 1.5–6.5)
NEUT%: 60 % (ref 38.4–76.8)
Platelets: 155 10*3/uL (ref 145–400)
RBC: 4.17 10*6/uL (ref 3.70–5.45)
RDW: 19.8 % — ABNORMAL HIGH (ref 11.2–14.5)
WBC: 5.1 10*3/uL (ref 3.9–10.3)
lymph#: 1.4 10*3/uL (ref 0.9–3.3)

## 2014-03-02 LAB — LACTATE DEHYDROGENASE (CC13): LDH: 151 U/L (ref 125–245)

## 2014-03-02 NOTE — Telephone Encounter (Signed)
gv pt appt schedule for for april. [facility] already [facility] on schedule and pt will contact them re changing date

## 2014-03-02 NOTE — Telephone Encounter (Signed)
opened in error. see previous note

## 2014-03-05 NOTE — Progress Notes (Signed)
[place], MD [place] [address]  DIAGNOSIS: Other malignant lymphomas, unspecified site, extranodal and solid organ sites - Plan: CBC with Differential, Comprehensive metabolic panel (Cmet) - [facility], Lactate dehydrogenase (LDH) - [facility]  Chief Complaint  Patient presents with  . Other malignant lymphomas, unspecified site, extranodal and    CURRENT THERAPY: CHOEP q 21 days started on 05/20/2013.  Completed 6 of 6 planned cycles for CHOEP on 09/05/2013.  Cycle #5 was complicated by ICU-level care secondary to severe hyperglycemia (see prior note for details).  Cycle #6, prednisone was excluded.  She received neulasta throughout her therapy.  She underwent autologous SCT on 28th of January at [facility] ([facility]).    INTERVAL HISTORY: Megan Mcknight 53 y.o. 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 She was last seen by me on 12/01/2014.    As noted above, she had an autologous SCT at [facility] on 28 th of January and discharged on 16 th of February.  She reports of feeling well overall. She denies any nausea/vomiting/fever or chills/emergency room visits/hospitalizations. He reports continued out lateral hand tingling and numbness that is stable.  He denies any chest pain, palpitations, worsening fatigue.  She has lost her hair. She only has some minimal dyspnea on exertion and is not using any oxygen. She denies any pain. She denied cough. She denied a mucositis or night sweats.  MEDICAL HISTORY: Past Medical History  Diagnosis Date  . Hyperlipidemia   . Nerve pain     Left leg  . Seasonal allergies   . Acute bronchitis   . Colon polyps 02/2013    Per colonoscopy  . Diverticulosis 02/2013    Per colonoscopy  . Former light tobacco smoker   . Dysrhythmia   . Cancer     lung and kidney  . Anaplastic large cell lymphoma   . Asthma   . GERD  (gastroesophageal reflux disease)   . Diabetes mellitus without complication     INTERIM HISTORY: has GERD (gastroesophageal reflux disease); Shortness of breath; Atrial fibrillation with RVR; Bilateral pneumonia; Acute renal insufficiency; Normocytic anemia; Other malignant lymphomas, unspecified site, extranodal and solid organ sites; Tumor lysis syndrome; Hyperglycemia without ketosis; Acute renal failure; Acute encephalopathy; Hyperkalemia; Neuropathy due to chemotherapeutic drug; Inguinal adenopathy; and Breast mass, left on her problem list.    ALLERGIES:  is allergic to iodine and shellfish-derived products.  MEDICATIONS: has a current medication list which includes the following prescription(s): acyclovir, albuterol, amlodipine, folic acid, ibuprofen, insulin glargine, lansoprazole, lidocaine-prilocaine, metformin, metoprolol tartrate, montelukast, multiple vitamin, oxycodone, potassium chloride, prochlorperazine, rosuvastatin, sulfamethoxazole-trimethoprim, and vitamin d (ergocalciferol).  SURGICAL HISTORY:  Past Surgical History  Procedure Laterality Date  . Partial hysterectomy    . Bones removed from baby toes    . Colonoscopy  02/16/2012    Procedure: COLONOSCOPY;  Surgeon: Daneil Dolin, MD;  [facility];  Service: Endoscopy;  Laterality: N/A;  1:30  . Abdominal hysterectomy    . Video bronchoscopy with endobronchial ultrasound N/A 05/13/2013    Procedure: VIDEO BRONCHOSCOPY WITH ENDOBRONCHIAL ULTRASOUND;  Surgeon: Melrose Nakayama, MD;  Location: [facility];  Service: Thoracic;  Laterality: N/A;  . Mediastinoscopy N/A 05/13/2013    Procedure: MEDIASTINOSCOPY;  Surgeon: Melrose Nakayama, MD;  Location: [place];  Service: Thoracic;  Laterality: N/A;   PROBLEM LIST:  1. Anaplastic large-cell lymphoma,  CD 30 positive, ALK positive, CD 20, negative, high grade, stage IV with involvement of mediastinal lymph nodes from which biopsy was obtained on 05/13/2013 by Remo Lipps C.  Roxan Hockey, M.D. There is also involvement of the lungs with multiple lung nodules, liver and bilateral inguinal lymph nodes,  the latter seen on PET scan from 05/30/2013. Bone marrow on 05/18/2013 was negative. IPI score was 2. Histologic diagnosis was made on 05/13/2013 by mediastinal lymph node biopsy. Chemotherapy with Cytoxan, Adriamycin, vincristine, etoposide and prednisone in conjunction with Neulasta was initiated on 05/20/2013.  2. Brief episode of atrial fibrillation with rapid ventricular  response in late May 2014 at time of lymphoma presentation.  3. Dyslipidemia.  4. Seasonal allergies.  5. Diverticulosis.  6. Status post hysterectomy.  7. Right-sided Port-A-Cath placed on 05/23/2013.  8. Hyperglycemia/Diabetes Mellitus following cycle #5.   ONCOLOGY TIMELINE: *05/10/2013.  The patient was found to have extensive pulmonary nodules on a chest CT scan.  *05/13/2013. Video bronchoscopy with endobronchial ultrasound and mediastinoscopy by Revonda Standard. Hendrickson,  mediastinal biopsies showed anaplastic large-cell lymphoma, CD 30 and ALK positive. CD 20 negative. Tumor was high-grade stage IV with an IPI score of 2.   *05/18/2013. Bone marrow aspirate and biopsy on  was negative for lymphoma.   *05/20/2013.Chemotherapy with CHOEP regimen was started with slightly decreased dose due to profound bone marrow suppression.     *05/23/2013. Port-A-Cath placed via interventional radiology on the right side on 05/23/2013.   *05/30/2013.  PET scan following cycle #1 shows only uptake in bilateral inguinal lymph nodes that are not really significantly enlarged. The patient has had a striking regression of tumor in her lungs, at least 90%. There has also been a decrease in the liver masses. None of those areas showed increased uptake, interestingly enough.   REVIEW OF SYSTEMS:   Constitutional: Denies fevers, chills or abnormal weight loss Eyes: Denies blurriness of vision Ears, nose,  mouth, throat, and face: Denies mucositis or sore throat Respiratory: Denies cough, dyspnea or wheezes Cardiovascular: Denies palpitation, chest discomfort or lower extremity swelling Gastrointestinal:  Denies nausea, heartburn or change in bowel habits Skin: Denies abnormal skin rashes Lymphatics: Denies new lymphadenopathy or easy bruising Neurological:Denies numbness, tingling or new weaknesses Behavioral/Psych: Mood is stable, no new changes  All other systems were reviewed with the patient and are negative.  PHYSICAL EXAMINATION: ECOG PERFORMANCE STATUS: 0 - Asymptomatic  Blood pressure 118/74, pulse 78, temperature 98.1 F (36.7 C), temperature source Oral, resp. rate 19, height 5' 8"  (1.727 m), weight 220 lb 11.2 oz (100.109 kg).  GENERAL:alert, no distress and comfortable; Alopecia SKIN: skin color, texture, turgor are normal, no rashes or significant lesions; R port a cath in place.   EYES: normal, Conjunctiva are pink and non-injected, sclera clear OROPHARYNX:no exudate, no erythema and lips, buccal mucosa, and tongue normal  NECK: supple, thyroid normal size, non-tender, without nodularity LYMPH:  no palpable lymphadenopathy in the cervical, axillary or supraclavicular; s/p left inguinal biopsy healed.  LUNGS: clear to auscultation and percussion with normal breathing effort HEART: regular rate & rhythm and no murmurs and no lower extremity edema ABDOMEN:abdomen soft, non-tender and normal bowel sounds Musculoskeletal:no cyanosis of digits and no clubbing; darkening of both hands bilaterally with some dry skin.    NEURO: alert & oriented x 3 with fluent speech, no focal motor/sensory deficits   Labs:  Lab Results  Component Value Date   WBC 5.1 03/02/2014   HGB 10.9* 03/02/2014   HCT 34.2* 03/02/2014  MCV 82.1 03/02/2014   PLT 155 03/02/2014   NEUTROABS 3.0 03/02/2014      Chemistry      Component Value Date/Time   NA 145 03/02/2014 1604   NA 142 08/29/2013 0530   K  4.2 03/02/2014 1604   K 3.6 08/29/2013 0530   CL 111 08/29/2013 0530   CO2 25 03/02/2014 1604   CO2 24 08/29/2013 0530   BUN 12.9 03/02/2014 1604   BUN 6 08/29/2013 0530   CREATININE 0.8 03/02/2014 1604   CREATININE 0.76 08/29/2013 0530      Component Value Date/Time   CALCIUM 9.6 03/02/2014 1604   CALCIUM 9.0 08/29/2013 0530   ALKPHOS 58 03/02/2014 1604   ALKPHOS 96 05/24/2013 0510   AST 21 03/02/2014 1604   AST 25 05/24/2013 0510   ALT 23 03/02/2014 1604   ALT 75* 05/24/2013 0510   BILITOT 0.28 03/02/2014 1604   BILITOT 0.3 05/24/2013 0510     Basic Metabolic Panel:  Recent Labs Lab 03/02/14 1604  NA 145  K 4.2  CO2 25  GLUCOSE 81  BUN 12.9  CREATININE 0.8  CALCIUM 9.6   GFR Estimated Creatinine Clearance: 101.8 ml/min (by C-G formula based on Cr of 0.8). Liver Function Tests:  Recent Labs Lab 03/02/14 1604  AST 21  ALT 23  ALKPHOS 58  BILITOT 0.28  PROT 6.6  ALBUMIN 3.8   CBC:  Recent Labs Lab 03/02/14 1604  WBC 5.1  NEUTROABS 3.0  HGB 10.9*  HCT 34.2*  MCV 82.1  PLT 155   RADIOGRAPHIC STUDIES:  ASSESSMENT: [name] 53 y.o. female with a history of Other malignant lymphomas, unspecified site, extranodal and solid organ sites - Plan: CBC with Differential, Comprehensive metabolic panel (Cmet) - [facility], Lactate dehydrogenase (LDH) - [facility]  PLAN:  1. Stage IV Anaplastic Large-cell lymphoma (ALCL) CD30+, ALK+, CD20(-).  --She underwent autologous SCT on 02/28 and continues to recover well.  Her counts have recovered.  She denies any fevers or chills or acute shortness of breath.  She has a planned follow-up with Dr. Cassell Clement on May 14th.  We will plan on obtaining on May 11th.     2. Anemia secondary to chemotherapy. --Her counts including her WBC, Plts have recovered.  She does not have any symptoms of anemia presently.    3. Neuropathy secondary to chemotherapy.  --Tingling in hands is stable.  She can button her shirts without difficulty.   4. DM2  complicated by eye problems. --Patient is following with opthalmology for further evaluation and management.   5. Follow-up. --Patient should follow up in 4 weeks for labs and symptom visit.   All questions were answered. The patient knows to call the clinic with any problems, questions or concerns. We can certainly see the patient much sooner if necessary.  I spent 15 minutes counseling the patient face to face. The total time spent in the appointment was 25 minutes.    Fidel Caggiano, MD 03/05/2014 11:44 PM

## 2014-04-04 ENCOUNTER — Other Ambulatory Visit (HOSPITAL_BASED_OUTPATIENT_CLINIC_OR_DEPARTMENT_OTHER): Payer: PRIVATE HEALTH INSURANCE

## 2014-04-04 ENCOUNTER — Encounter: Payer: Self-pay | Admitting: Internal Medicine

## 2014-04-04 ENCOUNTER — Ambulatory Visit (HOSPITAL_BASED_OUTPATIENT_CLINIC_OR_DEPARTMENT_OTHER): Payer: PRIVATE HEALTH INSURANCE | Admitting: Internal Medicine

## 2014-04-04 VITALS — BP 119/71 | HR 81 | Temp 98.4°F | Resp 18 | Ht 68.0 in | Wt 221.8 lb

## 2014-04-04 DIAGNOSIS — IMO0002 Reserved for concepts with insufficient information to code with codable children: Secondary | ICD-10-CM

## 2014-04-04 DIAGNOSIS — E119 Type 2 diabetes mellitus without complications: Secondary | ICD-10-CM

## 2014-04-04 DIAGNOSIS — T451X5A Adverse effect of antineoplastic and immunosuppressive drugs, initial encounter: Secondary | ICD-10-CM

## 2014-04-04 DIAGNOSIS — C8589 Other specified types of non-Hodgkin lymphoma, extranodal and solid organ sites: Secondary | ICD-10-CM

## 2014-04-04 DIAGNOSIS — D6481 Anemia due to antineoplastic chemotherapy: Secondary | ICD-10-CM

## 2014-04-04 LAB — COMPREHENSIVE METABOLIC PANEL (CC13)
ALT: 18 U/L (ref 0–55)
AST: 18 U/L (ref 5–34)
Albumin: 4.1 g/dL (ref 3.5–5.0)
Alkaline Phosphatase: 65 U/L (ref 40–150)
Anion Gap: 11 mEq/L (ref 3–11)
BUN: 15.8 mg/dL (ref 7.0–26.0)
CO2: 22 mEq/L (ref 22–29)
Calcium: 10 mg/dL (ref 8.4–10.4)
Chloride: 112 mEq/L — ABNORMAL HIGH (ref 98–109)
Creatinine: 1 mg/dL (ref 0.6–1.1)
Glucose: 130 mg/dl (ref 70–140)
Potassium: 4.2 mEq/L (ref 3.5–5.1)
Sodium: 145 mEq/L (ref 136–145)
Total Bilirubin: 0.46 mg/dL (ref 0.20–1.20)
Total Protein: 6.6 g/dL (ref 6.4–8.3)

## 2014-04-04 LAB — CBC WITH DIFFERENTIAL/PLATELET
BASO%: 0.3 % (ref 0.0–2.0)
Basophils Absolute: 0 10*3/uL (ref 0.0–0.1)
EOS%: 1.7 % (ref 0.0–7.0)
Eosinophils Absolute: 0.1 10*3/uL (ref 0.0–0.5)
HCT: 33.5 % — ABNORMAL LOW (ref 34.8–46.6)
HGB: 10.7 g/dL — ABNORMAL LOW (ref 11.6–15.9)
LYMPH%: 32.5 % (ref 14.0–49.7)
MCH: 26.7 pg (ref 25.1–34.0)
MCHC: 32 g/dL (ref 31.5–36.0)
MCV: 83.6 fL (ref 79.5–101.0)
MONO#: 0.3 10*3/uL (ref 0.1–0.9)
MONO%: 6 % (ref 0.0–14.0)
NEUT#: 3.3 10*3/uL (ref 1.5–6.5)
NEUT%: 59.5 % (ref 38.4–76.8)
Platelets: 197 10*3/uL (ref 145–400)
RBC: 4.01 10*6/uL (ref 3.70–5.45)
RDW: 17.9 % — ABNORMAL HIGH (ref 11.2–14.5)
WBC: 5.6 10*3/uL (ref 3.9–10.3)
lymph#: 1.8 10*3/uL (ref 0.9–3.3)

## 2014-04-04 LAB — LACTATE DEHYDROGENASE (CC13): LDH: 154 U/L (ref 125–245)

## 2014-04-04 NOTE — Progress Notes (Signed)
[place], MD [address] [place] [address]  DIAGNOSIS: Other malignant lymphomas, unspecified site, extranodal and solid organ sites  Chief Complaint  Patient presents with  . Other malignant lymphomas, unspecified site, extranodal and    CURRENT THERAPY: CHOEP q 21 days started on 05/20/2013.  Completed 6 of 6 planned cycles for CHOEP on 09/05/2013.  Cycle #5 was complicated by ICU-level care secondary to severe hyperglycemia (see prior note for details).  Cycle #6, prednisone was excluded.  She received neulasta throughout her therapy.  She underwent autologous SCT on 28th of January at [facility] ([facility]    INTERVAL HISTORY: Megan Mcknight 53 y.o. 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 She was last seen by me on 03/02/2014.    As noted above, she had an autologous SCT at [facility] on 28 th of January and discharged on 16 th of February.  She saw Dr. Zigmund Daniel (Disease and Surgery of the Retina and Vitreous)  of [facility] as follow-up on [address] [place], [place], [place]  [id number].  She is scheduled for retinal exam tomorrow.    She reports of feeling well overall. She denies any nausea/vomiting/fever or chills/emergency room visits/hospitalizations. He reports continued out lateral hand tingling and numbness that is stable.  He denies any chest pain, palpitations, worsening fatigue.  She has some signs of her hair regrowth. . She only has some minimal dyspnea on exertion and is not using any oxygen. She denies any pain. She denied cough. She denied a mucositis or night sweats.  MEDICAL HISTORY: Past Medical History  Diagnosis Date  . Hyperlipidemia   . Nerve pain     Left leg  . Seasonal allergies   . Acute bronchitis   . Colon polyps 02/2013    Per colonoscopy  . Diverticulosis 02/2013    Per colonoscopy  . Former  light tobacco smoker   . Dysrhythmia   . Cancer     lung and kidney  . Anaplastic large cell lymphoma   . Asthma   . GERD (gastroesophageal reflux disease)   . Diabetes mellitus without complication     INTERIM HISTORY: has GERD (gastroesophageal reflux disease); Shortness of breath; Atrial fibrillation with RVR; Bilateral pneumonia; Acute renal insufficiency; Normocytic anemia; Other malignant lymphomas, unspecified site, extranodal and solid organ sites; Tumor lysis syndrome; Hyperglycemia without ketosis; Acute renal failure; Acute encephalopathy; Hyperkalemia; Neuropathy due to chemotherapeutic drug; Inguinal adenopathy; and Breast mass, left on her problem list.    ALLERGIES:  is allergic to iodine and shellfish-derived products.  MEDICATIONS: has a current medication list which includes the following prescription(s): acyclovir, albuterol, amlodipine, folic acid, ibuprofen, insulin glargine, lansoprazole, lidocaine-prilocaine, metformin, metoprolol tartrate, montelukast, multiple vitamin, oxycodone, potassium chloride, prochlorperazine, rosuvastatin, sulfamethoxazole-trimethoprim, and vitamin d (ergocalciferol).  SURGICAL HISTORY:  Past Surgical History  Procedure Laterality Date  . Partial hysterectomy    . Bones removed from baby toes    . Colonoscopy  02/16/2012    Procedure: COLONOSCOPY;  Surgeon: Daneil Dolin, MD;  [facility];  Service: Endoscopy;  Laterality: N/A;  1:30  . Abdominal hysterectomy    . Video bronchoscopy with endobronchial ultrasound N/A 05/13/2013    Procedure: VIDEO BRONCHOSCOPY WITH ENDOBRONCHIAL ULTRASOUND;  Surgeon: Melrose Nakayama, MD;  Location: [place];  Service: Thoracic;  Laterality: N/A;  . Mediastinoscopy N/A 05/13/2013  Procedure: MEDIASTINOSCOPY;  Surgeon: Melrose Nakayama, MD;  Location: [place];  Service: Thoracic;  Laterality: N/A;   PROBLEM LIST:  1. Anaplastic large-cell lymphoma, CD 30 positive, ALK positive, CD 20,  negative, high grade, stage IV with involvement of mediastinal lymph nodes from which biopsy was obtained on 05/13/2013 by Remo Lipps C. Roxan Hockey, M.D. There is also involvement of the lungs with multiple lung nodules, liver and bilateral inguinal lymph nodes,  the latter seen on PET scan from 05/30/2013. Bone marrow on 05/18/2013 was negative. IPI score was 2. Histologic diagnosis was made on 05/13/2013 by mediastinal lymph node biopsy. Chemotherapy with Cytoxan, Adriamycin, vincristine, etoposide and prednisone in conjunction with Neulasta was initiated on 05/20/2013.  2. Brief episode of atrial fibrillation with rapid ventricular  response in late May 2014 at time of lymphoma presentation.  3. Dyslipidemia.  4. Seasonal allergies.  5. Diverticulosis.  6. Status post hysterectomy.  7. Right-sided Port-A-Cath placed on 05/23/2013.  8. Hyperglycemia/Diabetes Mellitus following cycle #5.   ONCOLOGY TIMELINE: *05/10/2013.  The patient was found to have extensive pulmonary nodules on a chest CT scan.  *05/13/2013. Video bronchoscopy with endobronchial ultrasound and mediastinoscopy by Revonda Standard. Hendrickson,  mediastinal biopsies showed anaplastic large-cell lymphoma, CD 30 and ALK positive. CD 20 negative. Tumor was high-grade stage IV with an IPI score of 2.   *05/18/2013. Bone marrow aspirate and biopsy on  was negative for lymphoma.   *05/20/2013.Chemotherapy with CHOEP regimen was started with slightly decreased dose due to profound bone marrow suppression.     *05/23/2013. Port-A-Cath placed via interventional radiology on the right side on 05/23/2013.   *05/30/2013.  PET scan following cycle #1 shows only uptake in bilateral inguinal lymph nodes that are not really significantly enlarged. The patient has had a striking regression of tumor in her lungs, at least 90%. There has also been a decrease in the liver masses. None of those areas showed increased uptake, interestingly enough.    REVIEW OF SYSTEMS:   Constitutional: Denies fevers, chills or abnormal weight loss Eyes: Denies blurriness of vision Ears, nose, mouth, throat, and face: Denies mucositis or sore throat Respiratory: Denies cough, dyspnea or wheezes Cardiovascular: Denies palpitation, chest discomfort or lower extremity swelling Gastrointestinal:  Denies nausea, heartburn or change in bowel habits Skin: Denies abnormal skin rashes Lymphatics: Denies new lymphadenopathy or easy bruising Neurological:Denies numbness, tingling or new weaknesses Behavioral/Psych: Mood is stable, no new changes  All other systems were reviewed with the patient and are negative.  PHYSICAL EXAMINATION: ECOG PERFORMANCE STATUS: 0 - Asymptomatic  Blood pressure 119/71, pulse 81, temperature 98.4 F (36.9 C), temperature source Oral, resp. rate 18, height _0  (1.727 m), weight 221 lb 12.8 oz (100.608 kg), SpO2 99.00%.  GENERAL:alert, no distress and comfortable; Alopecia SKIN: skin color, texture, turgor are normal, no rashes or significant lesions; R port a cath in place.   EYES: normal, Conjunctiva are pink and non-injected, sclera clear OROPHARYNX:no exudate, no erythema and lips, buccal mucosa, and tongue normal  NECK: supple, thyroid normal size, non-tender, without nodularity LYMPH:  no palpable lymphadenopathy in the cervical, axillary or supraclavicular; s/p left inguinal biopsy healed.  LUNGS: clear to auscultation and percussion with normal breathing effort HEART: regular rate & rhythm and no murmurs and no lower extremity edema ABDOMEN:abdomen soft, non-tender and normal bowel sounds Musculoskeletal:no cyanosis of digits and no clubbing; darkening of both hands bilaterally with some dry skin.    NEURO: alert & oriented x 3 with fluent speech,  no focal motor/sensory deficits   Labs:  Lab Results  Component Value Date   WBC 5.6 04/04/2014   HGB 10.7* 04/04/2014   HCT 33.5* 04/04/2014   MCV 83.6 04/04/2014   PLT  197 04/04/2014   NEUTROABS 3.3 04/04/2014      Chemistry      Component Value Date/Time   NA 145 04/04/2014 0919   NA 142 08/29/2013 0530   K 4.2 04/04/2014 0919   K 3.6 08/29/2013 0530   CL 111 08/29/2013 0530   CO2 22 04/04/2014 0919   CO2 24 08/29/2013 0530   BUN 15.8 04/04/2014 0919   BUN 6 08/29/2013 0530   CREATININE 1.0 04/04/2014 0919   CREATININE 0.76 08/29/2013 0530      Component Value Date/Time   CALCIUM 10.0 04/04/2014 0919   CALCIUM 9.0 08/29/2013 0530   ALKPHOS 65 04/04/2014 0919   ALKPHOS 96 05/24/2013 0510   AST 18 04/04/2014 0919   AST 25 05/24/2013 0510   ALT 18 04/04/2014 0919   ALT 75* 05/24/2013 0510   BILITOT 0.46 04/04/2014 0919   BILITOT 0.3 05/24/2013 0510     Basic Metabolic Panel:  Recent Labs Lab 04/04/14 0919  NA 145  K 4.2  CO2 22  GLUCOSE 130  BUN 15.8  CREATININE 1.0  CALCIUM 10.0   GFR Estimated Creatinine Clearance: 81.7 ml/min (by C-G formula based on Cr of 1). Liver Function Tests:  Recent Labs Lab 04/04/14 0919  AST 18  ALT 18  ALKPHOS 65  BILITOT 0.46  PROT 6.6  ALBUMIN 4.1   CBC:  Recent Labs Lab 04/04/14 0919  WBC 5.6  NEUTROABS 3.3  HGB 10.7*  HCT 33.5*  MCV 83.6  PLT 197   RADIOGRAPHIC STUDIES:  ASSESSMENT: Megan Mcknight 53 y.o. female with a history of Other malignant lymphomas, unspecified site, extranodal and solid organ sites  PLAN:  1. Stage IV Anaplastic Large-cell lymphoma (ALCL) CD30+, ALK+, CD20(-).  --She underwent autologous SCT on 02/28 and continues to recover well.  Her counts have recovered.  She denies any fevers or chills or acute shortness of breath.  She will see Dr. Cassell Clement on 14 th of next with a PET/Scan on th 11th.   2. Anemia secondary to chemotherapy. --Her counts including her WBC, Plts have recovered.  She does not have any symptoms of anemia presently.    3. Neuropathy secondary to chemotherapy, resolved.   4. 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 --Patient is following with opthalmology for  further evaluation and management. She will follow up with Dr. Zigmund Daniel tomorrow.   5. Follow-up. --Patient should follow up in 4 weeks for labs and in 2 months for a symptom visit.   All questions were answered. The patient knows to call the clinic with any problems, questions or concerns. We can certainly see the patient much sooner if necessary.  I spent 15 minutes counseling the patient face to face. The total time spent in the appointment was 25 minutes.    Concha Norway, MD 04/04/2014 10:03 AM

## 2014-04-05 ENCOUNTER — Encounter (INDEPENDENT_AMBULATORY_CARE_PROVIDER_SITE_OTHER): Payer: PRIVATE HEALTH INSURANCE | Admitting: Ophthalmology

## 2014-04-05 DIAGNOSIS — H3530 Unspecified macular degeneration: Secondary | ICD-10-CM

## 2014-04-05 DIAGNOSIS — E1165 Type 2 diabetes mellitus with hyperglycemia: Secondary | ICD-10-CM

## 2014-04-05 DIAGNOSIS — I10 Essential (primary) hypertension: Secondary | ICD-10-CM

## 2014-04-05 DIAGNOSIS — H43819 Vitreous degeneration, unspecified eye: Secondary | ICD-10-CM

## 2014-04-05 DIAGNOSIS — H33309 Unspecified retinal break, unspecified eye: Secondary | ICD-10-CM

## 2014-04-05 DIAGNOSIS — H35039 Hypertensive retinopathy, unspecified eye: Secondary | ICD-10-CM

## 2014-04-05 DIAGNOSIS — E11319 Type 2 diabetes mellitus with unspecified diabetic retinopathy without macular edema: Secondary | ICD-10-CM

## 2014-04-05 DIAGNOSIS — E1139 Type 2 diabetes mellitus with other diabetic ophthalmic complication: Secondary | ICD-10-CM

## 2014-04-06 ENCOUNTER — Telehealth: Payer: Self-pay

## 2014-04-06 ENCOUNTER — Telehealth: Payer: Self-pay | Admitting: Internal Medicine

## 2014-04-06 NOTE — Telephone Encounter (Signed)
Talked to, pt and gave her app for lab for MAy and June lab and MD

## 2014-04-06 NOTE — Telephone Encounter (Signed)
Pt called to let us know that Dr Tempie Hoist (eye MD) found no signs of lymphoma. He did some laser surgery to fix sequelae from an old injury.

## 2014-04-17 ENCOUNTER — Ambulatory Visit (HOSPITAL_COMMUNITY): Payer: PRIVATE HEALTH INSURANCE

## 2014-04-17 ENCOUNTER — Ambulatory Visit (INDEPENDENT_AMBULATORY_CARE_PROVIDER_SITE_OTHER): Payer: PRIVATE HEALTH INSURANCE | Admitting: Ophthalmology

## 2014-04-17 DIAGNOSIS — H33309 Unspecified retinal break, unspecified eye: Secondary | ICD-10-CM

## 2014-04-24 ENCOUNTER — Ambulatory Visit (HOSPITAL_COMMUNITY)
Admission: RE | Admit: 2014-04-24 | Discharge: 2014-04-24 | Disposition: A | Discharge status: Home / Self Care | Payer: PRIVATE HEALTH INSURANCE | Source: Ambulatory Visit | Attending: Internal Medicine | Admitting: Internal Medicine

## 2014-04-24 ENCOUNTER — Encounter (HOSPITAL_COMMUNITY): Payer: Self-pay

## 2014-04-24 DIAGNOSIS — C8589 Other specified types of non-Hodgkin lymphoma, extranodal and solid organ sites: Secondary | ICD-10-CM

## 2014-04-24 DIAGNOSIS — R928 Other abnormal and inconclusive findings on diagnostic imaging of breast: Secondary | ICD-10-CM | POA: Insufficient documentation

## 2014-04-24 LAB — GLUCOSE, CAPILLARY: Glucose-Capillary: 90 mg/dL (ref 70–99)

## 2014-04-24 MED ORDER — FLUDEOXYGLUCOSE F - 18 (FDG) INJECTION
10.4000 | Freq: Once | INTRAVENOUS | Status: AC | PRN
  Administered 2014-04-24: 10.4 via INTRAVENOUS

## 2014-05-02 ENCOUNTER — Other Ambulatory Visit (HOSPITAL_BASED_OUTPATIENT_CLINIC_OR_DEPARTMENT_OTHER): Payer: PRIVATE HEALTH INSURANCE

## 2014-05-02 DIAGNOSIS — C8589 Other specified types of non-Hodgkin lymphoma, extranodal and solid organ sites: Secondary | ICD-10-CM

## 2014-05-02 DIAGNOSIS — IMO0002 Reserved for concepts with insufficient information to code with codable children: Secondary | ICD-10-CM

## 2014-05-02 LAB — CBC WITH DIFFERENTIAL/PLATELET
BASO%: 0.4 % (ref 0.0–2.0)
Basophils Absolute: 0 10*3/uL (ref 0.0–0.1)
EOS%: 1.6 % (ref 0.0–7.0)
Eosinophils Absolute: 0.1 10*3/uL (ref 0.0–0.5)
HCT: 34.6 % — ABNORMAL LOW (ref 34.8–46.6)
HGB: 10.9 g/dL — ABNORMAL LOW (ref 11.6–15.9)
LYMPH%: 32.5 % (ref 14.0–49.7)
MCH: 26.6 pg (ref 25.1–34.0)
MCHC: 31.5 g/dL (ref 31.5–36.0)
MCV: 84.2 fL (ref 79.5–101.0)
MONO#: 0.3 10*3/uL (ref 0.1–0.9)
MONO%: 7.2 % (ref 0.0–14.0)
NEUT#: 2.7 10*3/uL (ref 1.5–6.5)
NEUT%: 58.3 % (ref 38.4–76.8)
Platelets: 174 10*3/uL (ref 145–400)
RBC: 4.1 10*6/uL (ref 3.70–5.45)
RDW: 16.6 % — ABNORMAL HIGH (ref 11.2–14.5)
WBC: 4.7 10*3/uL (ref 3.9–10.3)
lymph#: 1.5 10*3/uL (ref 0.9–3.3)

## 2014-05-31 ENCOUNTER — Other Ambulatory Visit (HOSPITAL_BASED_OUTPATIENT_CLINIC_OR_DEPARTMENT_OTHER): Payer: PRIVATE HEALTH INSURANCE

## 2014-05-31 ENCOUNTER — Ambulatory Visit (HOSPITAL_BASED_OUTPATIENT_CLINIC_OR_DEPARTMENT_OTHER): Payer: PRIVATE HEALTH INSURANCE | Admitting: Internal Medicine

## 2014-05-31 ENCOUNTER — Ambulatory Visit (HOSPITAL_BASED_OUTPATIENT_CLINIC_OR_DEPARTMENT_OTHER): Payer: PRIVATE HEALTH INSURANCE

## 2014-05-31 ENCOUNTER — Encounter: Payer: Self-pay | Admitting: Internal Medicine

## 2014-05-31 VITALS — BP 126/72 | HR 69 | Temp 97.8°F | Resp 18 | Ht 68.0 in | Wt 231.6 lb

## 2014-05-31 DIAGNOSIS — C8589 Other specified types of non-Hodgkin lymphoma, extranodal and solid organ sites: Secondary | ICD-10-CM

## 2014-05-31 DIAGNOSIS — D6481 Anemia due to antineoplastic chemotherapy: Secondary | ICD-10-CM

## 2014-05-31 DIAGNOSIS — R7401 Elevation of levels of liver transaminase levels: Secondary | ICD-10-CM | POA: Insufficient documentation

## 2014-05-31 DIAGNOSIS — E119 Type 2 diabetes mellitus without complications: Secondary | ICD-10-CM

## 2014-05-31 DIAGNOSIS — R740 Nonspecific elevation of levels of transaminase and lactic acid dehydrogenase [LDH]: Secondary | ICD-10-CM

## 2014-05-31 DIAGNOSIS — IMO0002 Reserved for concepts with insufficient information to code with codable children: Secondary | ICD-10-CM

## 2014-05-31 DIAGNOSIS — Z95828 Presence of other vascular implants and grafts: Secondary | ICD-10-CM

## 2014-05-31 DIAGNOSIS — T451X5A Adverse effect of antineoplastic and immunosuppressive drugs, initial encounter: Secondary | ICD-10-CM

## 2014-05-31 LAB — CBC WITH DIFFERENTIAL/PLATELET
BASO%: 1.2 % (ref 0.0–2.0)
Basophils Absolute: 0.1 10*3/uL (ref 0.0–0.1)
EOS%: 2.1 % (ref 0.0–7.0)
Eosinophils Absolute: 0.1 10*3/uL (ref 0.0–0.5)
HCT: 35.3 % (ref 34.8–46.6)
HGB: 11.3 g/dL — ABNORMAL LOW (ref 11.6–15.9)
LYMPH%: 33.2 % (ref 14.0–49.7)
MCH: 26.8 pg (ref 25.1–34.0)
MCHC: 32.2 g/dL (ref 31.5–36.0)
MCV: 83.5 fL (ref 79.5–101.0)
MONO#: 0.3 10*3/uL (ref 0.1–0.9)
MONO%: 6.6 % (ref 0.0–14.0)
NEUT#: 3 10*3/uL (ref 1.5–6.5)
NEUT%: 56.9 % (ref 38.4–76.8)
Platelets: 170 10*3/uL (ref 145–400)
RBC: 4.23 10*6/uL (ref 3.70–5.45)
RDW: 16.6 % — ABNORMAL HIGH (ref 11.2–14.5)
WBC: 5.2 10*3/uL (ref 3.9–10.3)
lymph#: 1.7 10*3/uL (ref 0.9–3.3)

## 2014-05-31 LAB — COMPREHENSIVE METABOLIC PANEL (CC13)
ALT: 75 U/L — ABNORMAL HIGH (ref 0–55)
AST: 56 U/L — ABNORMAL HIGH (ref 5–34)
Albumin: 3.9 g/dL (ref 3.5–5.0)
Alkaline Phosphatase: 60 U/L (ref 40–150)
Anion Gap: 10 mEq/L (ref 3–11)
BUN: 14.7 mg/dL (ref 7.0–26.0)
CO2: 26 mEq/L (ref 22–29)
Calcium: 9.9 mg/dL (ref 8.4–10.4)
Chloride: 112 mEq/L — ABNORMAL HIGH (ref 98–109)
Creatinine: 1 mg/dL (ref 0.6–1.1)
Glucose: 111 mg/dl (ref 70–140)
Potassium: 4.1 mEq/L (ref 3.5–5.1)
Sodium: 148 mEq/L — ABNORMAL HIGH (ref 136–145)
Total Bilirubin: 0.37 mg/dL (ref 0.20–1.20)
Total Protein: 6.6 g/dL (ref 6.4–8.3)

## 2014-05-31 LAB — LACTATE DEHYDROGENASE (CC13): LDH: 211 U/L (ref 125–245)

## 2014-05-31 MED ORDER — SODIUM CHLORIDE 0.9 % IJ SOLN
10.0000 mL | INTRAMUSCULAR | Status: DC | PRN
  Administered 2014-05-31: 10 mL via INTRAVENOUS
  Filled 2014-05-31: qty 10

## 2014-05-31 MED ORDER — HEPARIN SOD (PORK) LOCK FLUSH 100 UNIT/ML IV SOLN
500.0000 [IU] | Freq: Once | INTRAVENOUS | Status: AC
  Administered 2014-05-31: 500 [IU] via INTRAVENOUS
  Filled 2014-05-31: qty 5

## 2014-05-31 NOTE — Progress Notes (Signed)
[place], MD [address] [place] [address]  DIAGNOSIS: Other malignant lymphomas, unspecified site, extranodal and solid organ sites - Plan: Comprehensive metabolic panel, CBC with Differential, Comprehensive metabolic panel (Cmet) - [facility], Lactate dehydrogenase (LDH) - [facility], CT Chest Wo Contrast, CT Abdomen Wo Contrast  Transaminitis  Chief Complaint  Patient presents with  . Other malignant lymphomas, unspecified site, extranodal and    CURRENT THERAPY: CHOEP q 21 days started on 05/20/2013.  Completed 6 of 6 planned cycles for CHOEP on 09/05/2013.  Cycle #5 was complicated by ICU-level care secondary to severe hyperglycemia (see prior note for details).  Cycle #6, prednisone was excluded.  She received neulasta throughout her therapy.  She underwent autologous SCT on 28th of January at [facility] ([facility]    INTERVAL HISTORY: Megan Mcknight 53 y.o. 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 She was last seen by me on 04/04/2014.  She was last seen by Dr. Cassell Clement on 04/29/2014.   As noted above, she had an autologous SCT at [facility] on 28 th of January and discharged on 16 th of February.  She saw Dr. Zigmund Daniel (Disease and Surgery of the Retina and Vitreous)  of [facility] as follow-up on [address], [place], [place], [place]  [address].  She had a retinal exam and required laser therapy.    She reports of feeling well overall. She denies any nausea/vomiting/fever or chills/emergency room visits/hospitalizations. He reports continued out lateral hand tingling and numbness that is stable.  He denies any chest pain, palpitations, worsening fatigue.  She has some signs of her hair regrowth. . She only has some minimal dyspnea on exertion and is not using any oxygen. She denies any pain. She denied cough. She denied a mucositis or night  sweats.  MEDICAL HISTORY: Past Medical History  Diagnosis Date  . Hyperlipidemia   . Nerve pain     Left leg  . Seasonal allergies   . Acute bronchitis   . Colon polyps 02/2013    Per colonoscopy  . Diverticulosis 02/2013    Per colonoscopy  . Former light tobacco smoker   . Dysrhythmia   . Cancer     lung and kidney  . Anaplastic large cell lymphoma   . Asthma   . GERD (gastroesophageal reflux disease)   . Diabetes mellitus without complication     INTERIM HISTORY: has GERD (gastroesophageal reflux disease); Shortness of breath; Atrial fibrillation with RVR; Bilateral pneumonia; Acute renal insufficiency; Normocytic anemia; Other malignant lymphomas, unspecified site, extranodal and solid organ sites; Tumor lysis syndrome; Hyperglycemia without ketosis; Acute renal failure; Acute encephalopathy; Hyperkalemia; Neuropathy due to chemotherapeutic drug; Inguinal adenopathy; Breast mass, left; and Transaminitis on her problem list.    ALLERGIES:  is allergic to iodine and shellfish-derived products.  MEDICATIONS: has a current medication list which includes the following prescription(s): acyclovir, amlodipine, ergocalciferol, folic acid, insulin glargine, lansoprazole, lidocaine-prilocaine, metformin, metoprolol tartrate, multiple vitamin, potassium chloride, rosuvastatin, and sulfamethoxazole-trimethoprim.  SURGICAL HISTORY:  Past Surgical History  Procedure Laterality Date  . Partial hysterectomy    . Bones removed from baby toes    . Colonoscopy  02/16/2012    Procedure: COLONOSCOPY;  Surgeon: Daneil Dolin, MD;  [facility];  Service: Endoscopy;  Laterality: N/A;  1:30  . Abdominal hysterectomy    . Video bronchoscopy with endobronchial ultrasound N/A  05/13/2013    Procedure: VIDEO BRONCHOSCOPY WITH ENDOBRONCHIAL ULTRASOUND;  Surgeon: Melrose Nakayama, MD;  Location: [place];  Service: Thoracic;  Laterality: N/A;  . Mediastinoscopy N/A 05/13/2013    Procedure:  MEDIASTINOSCOPY;  Surgeon: Melrose Nakayama, MD;  Location: [place];  Service: Thoracic;  Laterality: N/A;   PROBLEM LIST:  1. Anaplastic large-cell lymphoma, CD 30 positive, ALK positive, CD 20, negative, high grade, stage IV with involvement of mediastinal lymph nodes from which biopsy was obtained on 05/13/2013 by Remo Lipps C. Roxan Hockey, M.D. There is also involvement of the lungs with multiple lung nodules, liver and bilateral inguinal lymph nodes,  the latter seen on PET scan from 05/30/2013. Bone marrow on 05/18/2013 was negative. IPI score was 2. Histologic diagnosis was made on 05/13/2013 by mediastinal lymph node biopsy. Chemotherapy with Cytoxan, Adriamycin, vincristine, etoposide and prednisone in conjunction with Neulasta was initiated on 05/20/2013.  2. Brief episode of atrial fibrillation with rapid ventricular  response in late May 2014 at time of lymphoma presentation.  3. Dyslipidemia.  4. Seasonal allergies.  5. Diverticulosis.  6. Status post hysterectomy.  7. Right-sided Port-A-Cath placed on 05/23/2013.  8. Hyperglycemia/Diabetes Mellitus following cycle #5.   ONCOLOGY TIMELINE: *05/10/2013.  The patient was found to have extensive pulmonary nodules on a chest CT scan.  *05/13/2013. Video bronchoscopy with endobronchial ultrasound and mediastinoscopy by Revonda Standard. Hendrickson,  mediastinal biopsies showed anaplastic large-cell lymphoma, CD 30 and ALK positive. CD 20 negative. Tumor was high-grade stage IV with an IPI score of 2.   *05/18/2013. Bone marrow aspirate and biopsy on  was negative for lymphoma.   *05/20/2013.Chemotherapy with CHOEP regimen was started with slightly decreased dose due to profound bone marrow suppression.     *05/23/2013. Port-A-Cath placed via interventional radiology on the right side on 05/23/2013.   *05/30/2013.  PET scan following cycle #1 shows only uptake in bilateral inguinal lymph nodes that are not really significantly enlarged. The  patient has had a striking regression of tumor in her lungs, at least 90%. There has also been a decrease in the liver masses. None of those areas showed increased uptake, interestingly enough.   REVIEW OF SYSTEMS:   Constitutional: Denies fevers, chills or abnormal weight loss Eyes: Denies blurriness of vision Ears, nose, mouth, throat, and face: Denies mucositis or sore throat Respiratory: Denies cough, dyspnea or wheezes Cardiovascular: Denies palpitation, chest discomfort or lower extremity swelling Gastrointestinal:  Denies nausea, heartburn or change in bowel habits Skin: Denies abnormal skin rashes Lymphatics: Denies new lymphadenopathy or easy bruising Neurological:Denies numbness, tingling or new weaknesses Behavioral/Psych: Mood is stable, no new changes  All other systems were reviewed with the patient and are negative.  PHYSICAL EXAMINATION: ECOG PERFORMANCE STATUS: 0 - Asymptomatic  Blood pressure 126/72, pulse 69, temperature 97.8 F (36.6 C), temperature source Oral, resp. rate 18, height _0  (1.727 m), weight 231 lb 9.6 oz (105.053 kg), SpO2 100.00%.  GENERAL:alert, no distress and comfortable; Alopecia SKIN: skin color, texture, turgor are normal, no rashes or significant lesions; R port a cath in place.   EYES: normal, Conjunctiva are pink and non-injected, sclera clear OROPHARYNX:no exudate, no erythema and lips, buccal mucosa, and tongue normal  NECK: supple, thyroid normal size, non-tender, without nodularity LYMPH:  no palpable lymphadenopathy in the cervical, axillary or supraclavicular; s/p left inguinal biopsy healed.  LUNGS: clear to auscultation and percussion with normal breathing effort HEART: regular rate & rhythm and no murmurs and no lower extremity edema ABDOMEN:abdomen  soft, non-tender and normal bowel sounds Musculoskeletal:no cyanosis of digits and no clubbing; darkening of both hands bilaterally with some dry skin.    NEURO: alert & oriented x 3  with fluent speech, no focal motor/sensory deficits   Labs:  Lab Results  Component Value Date   WBC 5.2 05/31/2014   HGB 11.3* 05/31/2014   HCT 35.3 05/31/2014   MCV 83.5 05/31/2014   PLT 170 05/31/2014   NEUTROABS 3.0 05/31/2014      Chemistry      Component Value Date/Time   NA 148* 05/31/2014 1305   NA 142 08/29/2013 0530   K 4.1 05/31/2014 1305   K 3.6 08/29/2013 0530   CL 111 08/29/2013 0530   CO2 26 05/31/2014 1305   CO2 24 08/29/2013 0530   BUN 14.7 05/31/2014 1305   BUN 6 08/29/2013 0530   CREATININE 1.0 05/31/2014 1305   CREATININE 0.76 08/29/2013 0530      Component Value Date/Time   CALCIUM 9.9 05/31/2014 1305   CALCIUM 9.0 08/29/2013 0530   ALKPHOS 60 05/31/2014 1305   ALKPHOS 96 05/24/2013 0510   AST 56* 05/31/2014 1305   AST 25 05/24/2013 0510   ALT 75* 05/31/2014 1305   ALT 75* 05/24/2013 0510   BILITOT 0.37 05/31/2014 1305   BILITOT 0.3 05/24/2013 0510     Results for SERITA, DEGROOTE (MRN [id number]) as of 05/31/2014 17:13  Ref. Range 09/29/2013 09:52 11/04/2013 09:42 03/02/2014 16:04 04/04/2014 09:19 05/31/2014 13:05  LDH Latest Range: 125-245 U/L 232 159 151 154 332   Basic Metabolic Panel:  Recent Labs Lab 05/31/14 1305  NA 148*  K 4.1  CO2 26  GLUCOSE 111  BUN 14.7  CREATININE 1.0  CALCIUM 9.9   GFR Estimated Creatinine Clearance: 83.5 ml/min (by C-G formula based on Cr of 1). Liver Function Tests:  Recent Labs Lab 05/31/14 1305  AST 56*  ALT 75*  ALKPHOS 60  BILITOT 0.37  PROT 6.6  ALBUMIN 3.9   CBC:  Recent Labs Lab 05/31/14 1305  WBC 5.2  NEUTROABS 3.0  HGB 11.3*  HCT 35.3  MCV 83.5  PLT 170   RADIOGRAPHIC STUDIES:  ASSESSMENT: Megan Mcknight 53 y.o. female with a history of Other malignant lymphomas, unspecified site, extranodal and solid organ sites - Plan: Comprehensive metabolic panel, CBC with Differential, Comprehensive metabolic panel (Cmet) - [facility], Lactate dehydrogenase (LDH) - [facility], CT Chest Wo Contrast, CT Abdomen Wo  Contrast  Transaminitis  PLAN:  1. Stage IV Anaplastic Large-cell lymphoma (ALCL) CD30+, ALK+, CD20(-).  --She underwent autologous SCT on 02/28 and continues to recover well.  Her counts have recovered.  She denies any fevers or chills or acute shortness of breath.  She had a negative PET on 04/24/2014.  We will obtain CT scan of chest/abdomen and pelvis for disease assessment every 3-4 months for the first year and every 6 months fot the second year s/p SCT and yearly thereafter until 5 years post transplant with results forwarded to [facility] prior to her next appointment.  We scheduled her CT on 08/09/2014.   We will also provide her 6 month vaccinations upon her next visit.   2. Anemia secondary to chemotherapy. --Her counts including her WBC, Plts have recovered.  She does not have any symptoms of anemia presently.    3. Neuropathy secondary to chemotherapy, resolved.   4. 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 --Patient is following with opthalmology for further evaluation and management. She will follow  up with Dr. Zigmund Daniel prn.  5. Transaminitis. --She has mildly elevated liver enzymes of unclear etiology.  We will repeat labs in one month to determine trend.   6. Follow-up. --Patient should follow up in 4 weeks for labs and in 2 months for a symptom visit with scans done on 08/09/2014 prior to next visit. She will require prevnar 13 per protocol for vaccinations (six month s/p transplant). Continue port-a-cath flushes every 2 months.   All questions were answered. The patient knows to call the clinic with any problems, questions or concerns. We can certainly see the patient much sooner if necessary.  I spent 15 minutes counseling the patient face to face. The total time spent in the appointment was 25 minutes.    CHISM, DAVID, MD 05/31/2014 5:16 PM

## 2014-05-31 NOTE — Patient Instructions (Signed)
Iron-Rich Diet An iron-rich diet contains foods that are good sources of iron. Iron is an important mineral that helps your body produce hemoglobin. Hemoglobin is a protein in red blood cells that carries oxygen to the body's tissues. Sometimes, the iron level in your blood can be low. This may be caused by:  A lack of iron in your diet.  Blood loss.  Times of growth, such as during pregnancy or during a child's growth and development. Low levels of iron can cause a decrease in the number of red blood cells. This can result in iron deficiency anemia. Iron deficiency anemia symptoms include:  Tiredness.  Weakness.  Irritability.  Increased chance of infection. Here are some recommendations for daily iron intake:  Males older than 53 years of age need 8 mg of iron per day.  Women ages 19 to 50 need 18 mg of iron per day.  Pregnant women need 27 mg of iron per day, and women who are over 19 years of age and breastfeeding need 9 mg of iron per day.  Women over the age of 50 need 8 mg of iron per day. SOURCES OF IRON There are 2 types of iron that are found in food: heme iron and nonheme iron. Heme iron is absorbed by the body better than nonheme iron. Heme iron is found in meat, poultry, and fish. Nonheme iron is found in grains, beans, and vegetables. Heme Iron Sources Food / Iron (mg)  Chicken liver, 3 oz (85 g)/ 10 mg  Beef liver, 3 oz (85 g)/ 5.5 mg  Oysters, 3 oz (85 g)/ 8 mg  Beef, 3 oz (85 g)/ 2 to 3 mg  Shrimp, 3 oz (85 g)/ 2.8 mg  Turkey, 3 oz (85 g)/ 2 mg  Chicken, 3 oz (85 g) / 1 mg  Fish (tuna, halibut), 3 oz (85 g)/ 1 mg  Pork, 3 oz (85 g)/ 0.9 mg Nonheme Iron Sources Food / Iron (mg)  Ready-to-eat breakfast cereal, iron-fortified / 3.9 to 7 mg  Tofu,  cup / 3.4 mg  Kidney beans,  cup / 2.6 mg  Baked potato with skin / 2.7 mg  Asparagus,  cup / 2.2 mg  Avocado / 2 mg  Dried peaches,  cup / 1.6 mg  Raisins,  cup / 1.5 mg  Soy milk, 1 cup  / 1.5 mg  Whole-wheat bread, 1 slice / 1.2 mg  Spinach, 1 cup / 0.8 mg  Broccoli,  cup / 0.6 mg IRON ABSORPTION Certain foods can decrease the body's absorption of iron. Try to avoid these foods and beverages while eating meals with iron-containing foods:  Coffee.  Tea.  Fiber.  Soy. Foods containing vitamin C can help increase the amount of iron your body absorbs from iron sources, especially from nonheme sources. Eat foods with vitamin C along with iron-containing foods to increase your iron absorption. Foods that are high in vitamin C include many fruits and vegetables. Some good sources are:  Fresh orange juice.  Oranges.  Strawberries.  Mangoes.  Grapefruit.  Red bell peppers.  Green bell peppers.  Broccoli.  Potatoes with skin.  Tomato juice. Document Released: 07/15/2005 Document Revised: 02/23/2012 Document Reviewed: 05/22/2011 Exit[employer] Patient Information 2015 [employer]. This information is not intended to replace advice given to you by your health care provider. Make sure you discuss any questions you have with your health care provider.  

## 2014-05-31 NOTE — Patient Instructions (Signed)

## 2014-06-01 ENCOUNTER — Other Ambulatory Visit: Payer: Self-pay | Admitting: Internal Medicine

## 2014-06-01 ENCOUNTER — Telehealth: Payer: Self-pay | Admitting: Medical Oncology

## 2014-06-01 ENCOUNTER — Telehealth: Payer: Self-pay | Admitting: Internal Medicine

## 2014-06-01 NOTE — Telephone Encounter (Signed)
Pt called and left a message that she will be going to [facility] 07/27/14. Per Dr.Chism he is going to have her CT rescheduled to an earlier date so she will have her results for that visit. I left this message on her voice mail and asked her to call if any questions or concerns.

## 2014-06-01 NOTE — Telephone Encounter (Signed)
lvm for pt regarding to July and Aug appt...mailed pt appt sched/avs and letter

## 2014-06-28 ENCOUNTER — Other Ambulatory Visit (HOSPITAL_BASED_OUTPATIENT_CLINIC_OR_DEPARTMENT_OTHER): Payer: PRIVATE HEALTH INSURANCE

## 2014-06-28 DIAGNOSIS — E119 Type 2 diabetes mellitus without complications: Secondary | ICD-10-CM

## 2014-06-28 DIAGNOSIS — C8589 Other specified types of non-Hodgkin lymphoma, extranodal and solid organ sites: Secondary | ICD-10-CM

## 2014-06-28 LAB — COMPREHENSIVE METABOLIC PANEL (CC13)
ALT: 33 U/L (ref 0–55)
AST: 21 U/L (ref 5–34)
Albumin: 4.1 g/dL (ref 3.5–5.0)
Alkaline Phosphatase: 62 U/L (ref 40–150)
Anion Gap: 8 mEq/L (ref 3–11)
BUN: 17.8 mg/dL (ref 7.0–26.0)
CO2: 27 mEq/L (ref 22–29)
Calcium: 9.8 mg/dL (ref 8.4–10.4)
Chloride: 111 mEq/L — ABNORMAL HIGH (ref 98–109)
Creatinine: 1.1 mg/dL (ref 0.6–1.1)
Glucose: 70 mg/dl (ref 70–140)
Potassium: 4.1 mEq/L (ref 3.5–5.1)
Sodium: 146 mEq/L — ABNORMAL HIGH (ref 136–145)
Total Bilirubin: 0.36 mg/dL (ref 0.20–1.20)
Total Protein: 6.8 g/dL (ref 6.4–8.3)

## 2014-07-26 ENCOUNTER — Other Ambulatory Visit (HOSPITAL_BASED_OUTPATIENT_CLINIC_OR_DEPARTMENT_OTHER): Payer: PRIVATE HEALTH INSURANCE

## 2014-07-26 ENCOUNTER — Ambulatory Visit (HOSPITAL_COMMUNITY)
Admission: RE | Admit: 2014-07-26 | Discharge: 2014-07-26 | Disposition: A | Discharge status: Home / Self Care | Payer: PRIVATE HEALTH INSURANCE | Source: Ambulatory Visit | Attending: Internal Medicine | Admitting: Internal Medicine

## 2014-07-26 ENCOUNTER — Encounter (HOSPITAL_COMMUNITY): Payer: Self-pay

## 2014-07-26 ENCOUNTER — Ambulatory Visit (HOSPITAL_BASED_OUTPATIENT_CLINIC_OR_DEPARTMENT_OTHER): Payer: PRIVATE HEALTH INSURANCE

## 2014-07-26 VITALS — BP 118/77 | HR 74 | Temp 97.9°F

## 2014-07-26 DIAGNOSIS — IMO0002 Reserved for concepts with insufficient information to code with codable children: Secondary | ICD-10-CM

## 2014-07-26 DIAGNOSIS — Z95828 Presence of other vascular implants and grafts: Secondary | ICD-10-CM

## 2014-07-26 DIAGNOSIS — C8589 Other specified types of non-Hodgkin lymphoma, extranodal and solid organ sites: Secondary | ICD-10-CM | POA: Diagnosis present

## 2014-07-26 DIAGNOSIS — R160 Hepatomegaly, not elsewhere classified: Secondary | ICD-10-CM | POA: Insufficient documentation

## 2014-07-26 DIAGNOSIS — R928 Other abnormal and inconclusive findings on diagnostic imaging of breast: Secondary | ICD-10-CM | POA: Insufficient documentation

## 2014-07-26 DIAGNOSIS — Z452 Encounter for adjustment and management of vascular access device: Secondary | ICD-10-CM

## 2014-07-26 LAB — COMPREHENSIVE METABOLIC PANEL
ALT: 28 U/L (ref 0–35)
AST: 27 U/L (ref 0–37)
Albumin: 4.2 g/dL (ref 3.5–5.2)
Alkaline Phosphatase: 62 U/L (ref 39–117)
BUN: 17 mg/dL (ref 6–23)
CO2: 23 mEq/L (ref 19–32)
Calcium: 9.9 mg/dL (ref 8.4–10.5)
Chloride: 106 mEq/L (ref 96–112)
Creatinine, Ser: 0.88 mg/dL (ref 0.50–1.10)
Glucose, Bld: 100 mg/dL — ABNORMAL HIGH (ref 70–99)
Potassium: 4.4 mEq/L (ref 3.5–5.3)
Sodium: 142 mEq/L (ref 135–145)
Total Bilirubin: 0.4 mg/dL (ref 0.2–1.2)
Total Protein: 7.2 g/dL (ref 6.0–8.3)

## 2014-07-26 LAB — CBC WITH DIFFERENTIAL/PLATELET
BASO%: 0.2 % (ref 0.0–2.0)
Basophils Absolute: 0 10*3/uL (ref 0.0–0.1)
EOS%: 2.1 % (ref 0.0–7.0)
Eosinophils Absolute: 0.1 10*3/uL (ref 0.0–0.5)
HCT: 36.8 % (ref 34.8–46.6)
HGB: 11.7 g/dL (ref 11.6–15.9)
LYMPH%: 41.5 % (ref 14.0–49.7)
MCH: 26.3 pg (ref 25.1–34.0)
MCHC: 31.7 g/dL (ref 31.5–36.0)
MCV: 83 fL (ref 79.5–101.0)
MONO#: 0.3 10*3/uL (ref 0.1–0.9)
MONO%: 6.5 % (ref 0.0–14.0)
NEUT#: 2.2 10*3/uL (ref 1.5–6.5)
NEUT%: 49.7 % (ref 38.4–76.8)
Platelets: 160 10*3/uL (ref 145–400)
RBC: 4.44 10*6/uL (ref 3.70–5.45)
RDW: 16.4 % — ABNORMAL HIGH (ref 11.2–14.5)
WBC: 4.5 10*3/uL (ref 3.9–10.3)
lymph#: 1.9 10*3/uL (ref 0.9–3.3)

## 2014-07-26 MED ORDER — SODIUM CHLORIDE 0.9 % IJ SOLN
10.0000 mL | INTRAMUSCULAR | Status: DC | PRN
  Administered 2014-07-26: 10 mL via INTRAVENOUS
  Filled 2014-07-26: qty 10

## 2014-07-26 MED ORDER — HEPARIN SOD (PORK) LOCK FLUSH 100 UNIT/ML IV SOLN
500.0000 [IU] | Freq: Once | INTRAVENOUS | Status: AC
  Administered 2014-07-26: 500 [IU] via INTRAVENOUS
  Filled 2014-07-26: qty 5

## 2014-07-26 NOTE — Patient Instructions (Signed)

## 2014-07-27 LAB — LACTATE DEHYDROGENASE (CC13): LDH: 215 U/L (ref 125–245)

## 2014-08-03 ENCOUNTER — Ambulatory Visit (HOSPITAL_BASED_OUTPATIENT_CLINIC_OR_DEPARTMENT_OTHER): Payer: PRIVATE HEALTH INSURANCE | Admitting: Internal Medicine

## 2014-08-03 VITALS — BP 156/89 | HR 71 | Temp 98.0°F | Resp 19 | Ht 68.0 in | Wt 243.7 lb

## 2014-08-03 DIAGNOSIS — C8589 Other specified types of non-Hodgkin lymphoma, extranodal and solid organ sites: Secondary | ICD-10-CM

## 2014-08-03 DIAGNOSIS — E119 Type 2 diabetes mellitus without complications: Secondary | ICD-10-CM

## 2014-08-03 DIAGNOSIS — R635 Abnormal weight gain: Secondary | ICD-10-CM

## 2014-08-03 DIAGNOSIS — IMO0002 Reserved for concepts with insufficient information to code with codable children: Secondary | ICD-10-CM

## 2014-08-03 NOTE — Progress Notes (Signed)
[place], MD [place] [address]  DIAGNOSIS: Other malignant lymphomas, unspecified site, extranodal and solid organ sites - Plan: CBC with Differential, Basic metabolic panel (Bmet) - [facility], Lactate dehydrogenase (LDH) - [facility]  Chief Complaint  Patient presents with  . Other malignant lymphomas, unspecified site, extranodal and    PRIOR THERAPY: CHOEP q 21 days started on 05/20/2013.  Completed 6 of 6 planned cycles for CHOEP on 09/05/2013.  Cycle #5 was complicated by ICU-level care secondary to severe hyperglycemia (see prior note for details).  Cycle #6, prednisone was excluded.  She received neulasta throughout her therapy.  She underwent autologous SCT on February 04th at [facility] ([facility] [place]).    CURRENT THERAPY:  Active surveillance.  INTERVAL HISTORY: LASHAWNTA Mcknight 53 y.o. 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 She was last seen by me on 05/31/2014.  She was last seen by Dr. Cassell Clement on 07/27/2014.   As noted above, she had an autologous SCT at [facility] on February 04th and discharged on 16 th of February.  She saw Dr. Zigmund Daniel (Disease and Surgery of the Retina and Vitreous)  of [facility] as follow-up on [address], [place], [place], [place]  [address].  She had a retinal exam and required laser therapy as noted previously.    She reports of feeling well overall. She notes that she has gained nearly 10 lbs since her last visit. She denies any nausea/vomiting/fever or chills/emergency room visits/hospitalizations. He reports continued out lateral hand tingling and numbness that is stable.  He denies any chest pain, palpitations, worsening fatigue.  She has some signs of her hair regrowth. . She only has some minimal dyspnea on exertion and is not using any oxygen. She denies any pain. She denied cough. She denied a  mucositis or night sweats.  MEDICAL HISTORY: Past Medical History  Diagnosis Date  . Hyperlipidemia   . Nerve pain     Left leg  . Seasonal allergies   . Acute bronchitis   . Colon polyps 02/2013    Per colonoscopy  . Diverticulosis 02/2013    Per colonoscopy  . Former light tobacco smoker   . Dysrhythmia   . Cancer     lung and kidney  . Anaplastic large cell lymphoma   . Asthma   . GERD (gastroesophageal reflux disease)   . Diabetes mellitus without complication     INTERIM HISTORY: has GERD (gastroesophageal reflux disease); Shortness of breath; Atrial fibrillation with RVR; Bilateral pneumonia; Acute renal insufficiency; Normocytic anemia; Other malignant lymphomas, unspecified site, extranodal and solid organ sites; Tumor lysis syndrome; Hyperglycemia without ketosis; Acute renal failure; Acute encephalopathy; Hyperkalemia; Neuropathy due to chemotherapeutic drug; Inguinal adenopathy; Breast mass, left; and Transaminitis on her problem list.    ALLERGIES:  is allergic to shellfish-derived products.  MEDICATIONS: has a current medication list which includes the following prescription(s): acyclovir, amlodipine, ergocalciferol, folic acid, insulin glargine, lansoprazole, lidocaine-prilocaine, metformin, metoprolol tartrate, multiple vitamin, and rosuvastatin.  SURGICAL HISTORY:  Past Surgical History  Procedure Laterality Date  . Partial hysterectomy    . Bones removed from baby toes    . Colonoscopy  02/16/2012    Procedure: COLONOSCOPY;  Surgeon: Daneil Dolin, MD;  [facility];  Service: Endoscopy;  Laterality: N/A;  1:30  . Abdominal hysterectomy    . Video bronchoscopy with endobronchial ultrasound  N/A 05/13/2013    Procedure: VIDEO BRONCHOSCOPY WITH ENDOBRONCHIAL ULTRASOUND;  Surgeon: Melrose Nakayama, MD;  Location: [facility];  Service: Thoracic;  Laterality: N/A;  . Mediastinoscopy N/A 05/13/2013    Procedure: MEDIASTINOSCOPY;  Surgeon: Melrose Nakayama,  MD;  Location: [facility];  Service: Thoracic;  Laterality: N/A;   PROBLEM LIST:  1. Anaplastic large-cell lymphoma, CD 30 positive, ALK positive, CD 20, negative, high grade, stage IV with involvement of mediastinal lymph nodes from which biopsy was obtained on 05/13/2013 by Remo Lipps C. Roxan Hockey, M.D. There is also involvement of the lungs with multiple lung nodules, liver and bilateral inguinal lymph nodes,  the latter seen on PET scan from 05/30/2013. Bone marrow on 05/18/2013 was negative. IPI score was 2. Histologic diagnosis was made on 05/13/2013 by mediastinal lymph node biopsy. Chemotherapy with Cytoxan, Adriamycin, vincristine, etoposide and prednisone in conjunction with Neulasta was initiated on 05/20/2013.  2. Brief episode of atrial fibrillation with rapid ventricular  response in late May 2014 at time of lymphoma presentation.  3. Dyslipidemia.  4. Seasonal allergies.  5. Diverticulosis.  6. Status post hysterectomy.  7. Right-sided Port-A-Cath placed on 05/23/2013.  8. Hyperglycemia/Diabetes Mellitus following cycle #5.   ADDITIONAL ONCOLOGY TIMELINE: *05/10/2013.  The patient was found to have extensive pulmonary nodules on a chest CT scan.  *05/13/2013. Video bronchoscopy with endobronchial ultrasound and mediastinoscopy by Revonda Standard. Hendrickson,  mediastinal biopsies showed anaplastic large-cell lymphoma, CD 30 and ALK positive. CD 20 negative. Tumor was high-grade stage IV with an IPI score of 2.   *05/18/2013. Bone marrow aspirate and biopsy on  was negative for lymphoma.   *05/20/2013.Chemotherapy with CHOEP regimen was started with slightly decreased dose due to profound bone marrow suppression.     *05/23/2013. Port-A-Cath placed via interventional radiology on the right side on 05/23/2013.   *05/30/2013.  PET scan following cycle #1 shows only uptake in bilateral inguinal lymph nodes that are not really significantly enlarged. The patient has had a striking regression  of tumor in her lungs, at least 90%. There has also been a decrease in the liver masses. None of those areas showed increased uptake, interestingly enough.   *09/05/2013. Completed 6 of 6 planned cycles for CHOEP on .  Cycle #5 was complicated by ICU-level care secondary to severe hyperglycemia (see prior note for details).  Cycle #6, prednisone was excluded.  She received neulasta throughout her therapy.   * 01/18/2014. She underwent autologous SCT on  at [facility] ([facility]).    *04/27/2014. Day +100 PET scan no FGD avid masses.  Left breast nodule stable in size appearance (noted in several previous mammograms).  REVIEW OF SYSTEMS:   Constitutional: Denies fevers, chills or abnormal weight loss Eyes: Denies blurriness of vision Ears, nose, mouth, throat, and face: Denies mucositis or sore throat Respiratory: Denies cough, dyspnea or wheezes Cardiovascular: Denies palpitation, chest discomfort or lower extremity swelling Gastrointestinal:  Denies nausea, heartburn or change in bowel habits Skin: Denies abnormal skin rashes Lymphatics: Denies new lymphadenopathy or easy bruising Neurological:Denies numbness, tingling or new weaknesses Behavioral/Psych: Mood is stable, no new changes  All other systems were reviewed with the patient and are negative.  PHYSICAL EXAMINATION: ECOG PERFORMANCE STATUS: 0 - Asymptomatic  Blood pressure 156/89, pulse 71, temperature 98 F (36.7 C), temperature source Oral, resp. rate 19, height 5' 8"  (1.727 m), weight 243 lb 11.2 oz (110.542 kg), SpO2 100.00%.  GENERAL:alert, no distress and comfortable; Alopecia SKIN: skin color, texture, turgor are  normal, no rashes or significant lesions; R port a cath in place.   EYES: normal, Conjunctiva are pink and non-injected, sclera clear OROPHARYNX:no exudate, no erythema and lips, buccal mucosa, and tongue normal  NECK: supple, thyroid normal size, non-tender, without nodularity LYMPH:  no palpable  lymphadenopathy in the cervical, axillary or supraclavicular; s/p left inguinal biopsy healed.  LUNGS: clear to auscultation and percussion with normal breathing effort HEART: regular rate & rhythm and no murmurs and no lower extremity edema ABDOMEN:abdomen soft, non-tender and normal bowel sounds Musculoskeletal:no cyanosis of digits and no clubbing; darkening of both hands bilaterally with some dry skin.    NEURO: alert & oriented x 3 with fluent speech, no focal motor/sensory deficits   Labs:  Lab Results  Component Value Date   WBC 4.5 07/26/2014   HGB 11.7 07/26/2014   HCT 36.8 07/26/2014   MCV 83.0 07/26/2014   PLT 160 07/26/2014   NEUTROABS 2.2 07/26/2014      Chemistry      Component Value Date/Time   NA 142 07/26/2014 1440   NA 146* 06/28/2014 1415   K 4.4 07/26/2014 1440   K 4.1 06/28/2014 1415   CL 106 07/26/2014 1440   CO2 23 07/26/2014 1440   CO2 27 06/28/2014 1415   BUN 17 07/26/2014 1440   BUN 17.8 06/28/2014 1415   CREATININE 0.88 07/26/2014 1440   CREATININE 1.1 06/28/2014 1415      Component Value Date/Time   CALCIUM 9.9 07/26/2014 1440   CALCIUM 9.8 06/28/2014 1415   ALKPHOS 62 07/26/2014 1440   ALKPHOS 62 06/28/2014 1415   AST 27 07/26/2014 1440   AST 21 06/28/2014 1415   ALT 28 07/26/2014 1440   ALT 33 06/28/2014 1415   BILITOT 0.4 07/26/2014 1440   BILITOT 0.36 06/28/2014 1415     RADIOGRAPHIC STUDIES:  CT CHEST AND ABDOMEN WITHOUT CONTRAST  TECHNIQUE: Multidetector CT imaging of the chest and abdomen was performed following the standard protocol without intravenous contrast. COMPARISON: PET 04/24/2014. FINDINGS: CT CHEST FINDINGS  Right IJ Port-A-Cath terminates in the high right atrium.  Mediastinal lymph nodes are not enlarged by CT size criteria. Hilar regions are difficult to definitively evaluate without IV contrast but appear grossly unremarkable. No axillary adenopathy. Heart size normal. No pericardial effusion. A 1 cm soft tissue nodule in the lateral left  breast is unchanged. Mild scattered upper lobe predominant peribronchovascular nodularity is likely postinfectious in etiology. No discrete dominant pulmonary nodule. Minimal scarring in the right lower lobe, adjacent to elevated right hemidiaphragm. No pleural fluid. Airway is unremarkable.  CT ABDOMEN FINDINGS Liver measures 20.7 cm. Liver, gallbladder, adrenal glands, kidneys, spleen, pancreas, stomach and visualized bowel are otherwise grossly  unremarkable. No pathologically enlarged lymph nodes. No free fluid. No worrisome lytic or sclerotic lesions. IMPRESSION: 1. No evidence of recurrent lymphoma.  2. 1 cm soft tissue nodule in the lateral left breast is unchanged. 3. Mild hepatomegaly.   ASSESSMENT: Megan Mcknight 53 y.o. female with a history of Other malignant lymphomas, unspecified site, extranodal and solid organ sites - Plan: CBC with Differential, Basic metabolic panel (Bmet) - [facility], Lactate dehydrogenase (LDH) - [facility]  PLAN:  1. Stage IV Anaplastic Large-cell lymphoma (ALCL) CD30+, ALK+, CD20(-).  --She underwent high dose BEAM autologous SCT on 02/04 (day #0) and continues to recover well.  Her counts have recovered.  She denies any fevers or chills or acute shortness of breath. Her CT of C/A/P is negative for recurrence.  Seen by Dr. Cassell Clement on 08/13 who regains to being post transplant immunizations as follows: 8 months -- PCV13, DTaP, HIB, IPV, Hep B; 10 months -- PCV13, DTaP; HIB, IPV and Hep B; and 18 months -- Hep B PCP 23; and at 24 months MMR.  She recommended to discontinue antibiotics for PCP prophylaxis and to continue antiviral therapy for the suppression of HSV and VZV.   --  We will obtain CT scan of chest/abdomen and pelvis for disease assessment every 3-4 months for the first year and every 6 months fot the second year s/p SCT and yearly thereafter until 5 years post transplant with results forwarded to [facility] prior to her next appointment.   2. DM2 complicated by  eye problems. --Patient is following with opthalmology for further evaluation and management. She will follow up with Dr. Zigmund Daniel prn.  3. Weight gain. --Counseled extensively on dieting and exercise.   4. Follow-up. --Patient should follow up  in 7-8 weeks months for a symptom visit vaccinations as noted above.  Continue port-a-cath flushes every 2 months.   All questions were answered. The patient knows to call the clinic with any problems, questions or concerns. We can certainly see the patient much sooner if necessary.  I spent 15 minutes counseling the patient face to face. The total time spent in the appointment was 25 minutes.    Sanaz Scarlett, MD 08/03/2014 2:33 PM

## 2014-08-09 ENCOUNTER — Telehealth: Payer: Self-pay | Admitting: Internal Medicine

## 2014-08-09 ENCOUNTER — Ambulatory Visit (HOSPITAL_COMMUNITY): Payer: PRIVATE HEALTH INSURANCE

## 2014-08-09 ENCOUNTER — Other Ambulatory Visit: Payer: PRIVATE HEALTH INSURANCE

## 2014-08-09 NOTE — Telephone Encounter (Signed)
s.w. pt and advised on OCT appt....pt ok adn aware

## 2014-08-11 ENCOUNTER — Ambulatory Visit: Payer: PRIVATE HEALTH INSURANCE

## 2014-08-28 ENCOUNTER — Ambulatory Visit (INDEPENDENT_AMBULATORY_CARE_PROVIDER_SITE_OTHER): Payer: PRIVATE HEALTH INSURANCE | Admitting: Ophthalmology

## 2014-09-18 ENCOUNTER — Ambulatory Visit (INDEPENDENT_AMBULATORY_CARE_PROVIDER_SITE_OTHER): Payer: PRIVATE HEALTH INSURANCE | Admitting: Ophthalmology

## 2014-09-20 ENCOUNTER — Ambulatory Visit (INDEPENDENT_AMBULATORY_CARE_PROVIDER_SITE_OTHER): Payer: PRIVATE HEALTH INSURANCE | Admitting: Ophthalmology

## 2014-09-20 ENCOUNTER — Telehealth: Payer: Self-pay | Admitting: *Deleted

## 2014-09-20 NOTE — Telephone Encounter (Signed)
Pt called to say she has had a "cold" on and off for one month . Is going to call PCP to see if she can be seen in [place]

## 2014-10-02 ENCOUNTER — Other Ambulatory Visit (HOSPITAL_COMMUNITY): Payer: Self-pay | Admitting: Internal Medicine

## 2014-10-02 DIAGNOSIS — Z1231 Encounter for screening mammogram for malignant neoplasm of breast: Secondary | ICD-10-CM

## 2014-10-04 ENCOUNTER — Ambulatory Visit: Payer: PRIVATE HEALTH INSURANCE

## 2014-10-04 ENCOUNTER — Ambulatory Visit (HOSPITAL_BASED_OUTPATIENT_CLINIC_OR_DEPARTMENT_OTHER): Payer: PRIVATE HEALTH INSURANCE | Admitting: Hematology

## 2014-10-04 ENCOUNTER — Encounter: Payer: Self-pay | Admitting: Hematology

## 2014-10-04 ENCOUNTER — Telehealth: Payer: Self-pay | Admitting: Hematology

## 2014-10-04 ENCOUNTER — Other Ambulatory Visit (HOSPITAL_BASED_OUTPATIENT_CLINIC_OR_DEPARTMENT_OTHER): Payer: PRIVATE HEALTH INSURANCE

## 2014-10-04 VITALS — BP 136/80 | HR 65 | Temp 98.3°F | Resp 18 | Ht 68.0 in | Wt 249.9 lb

## 2014-10-04 DIAGNOSIS — Z23 Encounter for immunization: Secondary | ICD-10-CM

## 2014-10-04 DIAGNOSIS — E1139 Type 2 diabetes mellitus with other diabetic ophthalmic complication: Secondary | ICD-10-CM

## 2014-10-04 DIAGNOSIS — Z95828 Presence of other vascular implants and grafts: Secondary | ICD-10-CM

## 2014-10-04 DIAGNOSIS — C8468 Anaplastic large cell lymphoma, ALK-positive, lymph nodes of multiple sites: Secondary | ICD-10-CM

## 2014-10-04 DIAGNOSIS — C8460 Anaplastic large cell lymphoma, ALK-positive, unspecified site: Secondary | ICD-10-CM

## 2014-10-04 DIAGNOSIS — R635 Abnormal weight gain: Secondary | ICD-10-CM

## 2014-10-04 DIAGNOSIS — C8599 Non-Hodgkin lymphoma, unspecified, extranodal and solid organ sites: Secondary | ICD-10-CM

## 2014-10-04 LAB — BASIC METABOLIC PANEL (CC13)
Anion Gap: 8 mEq/L (ref 3–11)
BUN: 15.2 mg/dL (ref 7.0–26.0)
CO2: 26 mEq/L (ref 22–29)
Calcium: 9.6 mg/dL (ref 8.4–10.4)
Chloride: 111 mEq/L — ABNORMAL HIGH (ref 98–109)
Creatinine: 0.9 mg/dL (ref 0.6–1.1)
Glucose: 136 mg/dl (ref 70–140)
Potassium: 3.8 mEq/L (ref 3.5–5.1)
Sodium: 144 mEq/L (ref 136–145)

## 2014-10-04 LAB — CBC WITH DIFFERENTIAL/PLATELET
BASO%: 0.9 % (ref 0.0–2.0)
Basophils Absolute: 0 10*3/uL (ref 0.0–0.1)
EOS%: 1.9 % (ref 0.0–7.0)
Eosinophils Absolute: 0.1 10*3/uL (ref 0.0–0.5)
HCT: 37.5 % (ref 34.8–46.6)
HGB: 11.8 g/dL (ref 11.6–15.9)
LYMPH%: 35.6 % (ref 14.0–49.7)
MCH: 26.4 pg (ref 25.1–34.0)
MCHC: 31.5 g/dL (ref 31.5–36.0)
MCV: 83.9 fL (ref 79.5–101.0)
MONO#: 0.3 10*3/uL (ref 0.1–0.9)
MONO%: 6.4 % (ref 0.0–14.0)
NEUT#: 2.9 10*3/uL (ref 1.5–6.5)
NEUT%: 55.2 % (ref 38.4–76.8)
Platelets: 172 10*3/uL (ref 145–400)
RBC: 4.47 10*6/uL (ref 3.70–5.45)
RDW: 15.8 % — ABNORMAL HIGH (ref 11.2–14.5)
WBC: 5.3 10*3/uL (ref 3.9–10.3)
lymph#: 1.9 10*3/uL (ref 0.9–3.3)

## 2014-10-04 LAB — LACTATE DEHYDROGENASE (CC13): LDH: 190 U/L (ref 125–245)

## 2014-10-04 MED ORDER — INFLUENZA VAC SPLIT QUAD 0.5 ML IM SUSY
0.5000 mL | PREFILLED_SYRINGE | Freq: Once | INTRAMUSCULAR | Status: AC
  Administered 2014-10-04: 0.5 mL via INTRAMUSCULAR
  Filled 2014-10-04: qty 0.5

## 2014-10-04 MED ORDER — SODIUM CHLORIDE 0.9 % IJ SOLN
10.0000 mL | INTRAMUSCULAR | Status: DC | PRN
Start: 2014-10-04 — End: 2014-10-04
  Administered 2014-10-04: 10 mL via INTRAVENOUS
  Filled 2014-10-04: qty 10

## 2014-10-04 MED ORDER — PNEUMOCOCCAL 13-VAL CONJ VACC IM SUSP
0.5000 mL | Freq: Once | INTRAMUSCULAR | Status: AC
  Administered 2014-10-04: 0.5 mL via INTRAMUSCULAR
  Filled 2014-10-04: qty 0.5

## 2014-10-04 MED ORDER — HEPARIN SOD (PORK) LOCK FLUSH 100 UNIT/ML IV SOLN
500.0000 [IU] | Freq: Once | INTRAVENOUS | Status: AC
  Administered 2014-10-04: 500 [IU] via INTRAVENOUS
  Filled 2014-10-04: qty 5

## 2014-10-04 MED ORDER — HAEMOPHILUS B POLYSAC CONJ VAC IM SOLR
0.5000 mL | Freq: Once | INTRAMUSCULAR | Status: AC
  Administered 2014-10-04: 0.5 mL via INTRAMUSCULAR
  Filled 2014-10-04: qty 0.5

## 2014-10-04 MED ORDER — DTAP-HEPATITIS B RECOMB-IPV IM SUSP
0.5000 mL | Freq: Once | INTRAMUSCULAR | Status: AC
  Administered 2014-10-04: 0.5 mL via INTRAMUSCULAR
  Filled 2014-10-04: qty 0.5

## 2014-10-04 MED ORDER — PNEUMOCOCCAL VAC POLYVALENT 25 MCG/0.5ML IJ INJ: 0.5000 mL | INJECTION | Freq: Once | INTRAMUSCULAR | Status: DC

## 2014-10-04 NOTE — Telephone Encounter (Signed)
returned pt call lvm confirming appt

## 2014-10-04 NOTE — Patient Instructions (Signed)

## 2014-10-04 NOTE — Patient Instructions (Signed)

## 2014-10-04 NOTE — Progress Notes (Signed)
[facility] ONCOLOGY OFFICE PROGRESS NOTE DATE OF VISIT: 10/05/2015  [facility], MD [address] [place] [address]  DIAGNOSIS: Anaplastic ALK-positive large cell lymphoma - Plan: DTaP-hepatitis B recombinant-IPV (PEDIARIX) injection 0.5 mL, Influenza vac split quadrivalent PF (FLUARIX) injection 0.5 mL, CBC with Differential, Comprehensive metabolic panel (Cmet) - [facility], Lactate dehydrogenase (LDH) - [facility], haemophilus B polysaccharide conjugate vaccine (ActHIB) injection 0.5 mL, pneumococcal 13-valent conjugate vaccine (PREVNAR 13) injection 0.5 mL, DISCONTINUED: pneumococcal 23 valent vaccine (PNU-IMMUNE) injection 0.5 mL  Chief Complaint  Patient presents with  . Follow-up   PRIOR THERAPY: CHOEP q 21 days started on 05/20/2013.  Completed 6 of 6 planned cycles for CHOEP on 09/05/2013.  Cycle #5 was complicated by ICU-level care secondary to severe hyperglycemia (see prior note for details).  Cycle #6, prednisone was excluded.  She received neulasta throughout her therapy.  She underwent autologous SCT on February 04th 2015 at [facility] ([place]    CURRENT THERAPY:  Active surveillance.  INTERVAL HISTORY: Megan Mcknight 53 y.o. 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 She was last seen by Dr Juliann Mule on 08/03/2014.  She was last seen by Dr. Cassell Clement on 07/27/2014.   As noted above, she had an autologous SCT at [facility] on February 04th and discharged on 16 th of February.  She saw Dr. Zigmund Daniel (Disease and Surgery of the Retina and Vitreous)  of [facility] as follow-up on [address], [place], [place], [place]  [address].  She had a retinal exam and required laser therapy as noted previously.    She reports of feeling well overall. She notes that she has gained nearly 10 lbs since her last visit. She denies any nausea/vomiting/fever or chills/emergency room  visits/hospitalizations. He reports continued out lateral hand tingling and numbness that is stable.  He denies any chest pain, palpitations, worsening fatigue.  She has some signs of her hair regrowth. . She only has some minimal dyspnea on exertion and is not using any oxygen. She denies any pain. She denied cough. She denied a mucositis or night sweats.  Today she is getting her first set of vaccinations as per [facility] protocol. She is 8 months from her transplant and she gets PCV 13, DTaP, HIB, IPV, hep B. and flu shot today. In 2 months she gets PCV 13, DTaP, HIB, IPV and hepatitis B, 18 months she will see there's been PCP 23 and 24 months she will get an MMR. She is also on acyclovir high-dose for suppression of HSV and VZV.   I reviewed her medications she is taking acyclovir, Norvasc, vitamin D 2, folic acid, Lantus , Glucophage, Lopressor, multivitamin and Crestor. She's getting another CAT scan next month at [place]: Past Medical History  Diagnosis Date  . Hyperlipidemia   . Nerve pain     Left leg  . Seasonal allergies   . Acute bronchitis   . Colon polyps 02/2013    Per colonoscopy  . Diverticulosis 02/2013    Per colonoscopy  . Former light tobacco smoker   . Dysrhythmia   . Cancer     lung and kidney  . Anaplastic large cell lymphoma   . Asthma   . GERD (gastroesophageal reflux disease)   . Diabetes mellitus without complication     INTERIM HISTORY: has GERD (gastroesophageal reflux disease); Shortness of breath; Atrial fibrillation with RVR; Bilateral pneumonia;  Acute renal insufficiency; Normocytic anemia; Other malignant lymphomas, unspecified site, extranodal and solid organ sites; Tumor lysis syndrome; Hyperglycemia without ketosis; Acute renal failure; Acute encephalopathy; Hyperkalemia; Neuropathy due to chemotherapeutic drug; Inguinal adenopathy; Breast mass, left; and Transaminitis on her problem list.    ALLERGIES:  is allergic to  shellfish-derived products.  MEDICATIONS: has a current medication list which includes the following prescription(s): acyclovir, amlodipine, ergocalciferol, folic acid, insulin glargine, lansoprazole, lidocaine-prilocaine, metformin, metoprolol tartrate, multiple vitamin, and rosuvastatin.  SURGICAL HISTORY:  Past Surgical History  Procedure Laterality Date  . Partial hysterectomy    . Bones removed from baby toes    . Colonoscopy  02/16/2012    Procedure: COLONOSCOPY;  Surgeon: Daneil Dolin, MD;  [facility];  Service: Endoscopy;  Laterality: N/A;  1:30  . Abdominal hysterectomy    . Video bronchoscopy with endobronchial ultrasound N/A 05/13/2013    Procedure: VIDEO BRONCHOSCOPY WITH ENDOBRONCHIAL ULTRASOUND;  Surgeon: Melrose Nakayama, MD;  Location: [facility];  Service: Thoracic;  Laterality: N/A;  . Mediastinoscopy N/A 05/13/2013    Procedure: MEDIASTINOSCOPY;  Surgeon: Melrose Nakayama, MD;  Location: [place];  Service: Thoracic;  Laterality: N/A;   PROBLEM LIST:  1. Anaplastic large-cell lymphoma, CD 30 positive, ALK positive, CD 20, negative, high grade, stage IV with involvement of mediastinal lymph nodes from which biopsy was obtained on 05/13/2013 by Remo Lipps C. Roxan Hockey, M.D. There is also involvement of the lungs with multiple lung nodules, liver and bilateral inguinal lymph nodes,  the latter seen on PET scan from 05/30/2013. Bone marrow on 05/18/2013 was negative. IPI score was 2. Histologic diagnosis was made on 05/13/2013 by mediastinal lymph node biopsy. Chemotherapy with Cytoxan, Adriamycin, vincristine, etoposide and prednisone in conjunction with Neulasta was initiated on 05/20/2013.  2. Brief episode of atrial fibrillation with rapid ventricular  response in late May 2014 at time of lymphoma presentation.  3. Dyslipidemia.  4. Seasonal allergies.  5. Diverticulosis.  6. Status post hysterectomy.  7. Right-sided Port-A-Cath placed on 05/23/2013.  8.  Hyperglycemia/Diabetes Mellitus following cycle #5.   ADDITIONAL ONCOLOGY TIMELINE: *05/10/2013.  The patient was found to have extensive pulmonary nodules on a chest CT scan.  *05/13/2013. Video bronchoscopy with endobronchial ultrasound and mediastinoscopy by Revonda Standard. Hendrickson,  mediastinal biopsies showed anaplastic large-cell lymphoma, CD 30 and ALK positive. CD 20 negative. Tumor was high-grade stage IV with an IPI score of 2.   *05/18/2013. Bone marrow aspirate and biopsy on  was negative for lymphoma.   *05/20/2013.Chemotherapy with CHOEP regimen was started with slightly decreased dose due to profound bone marrow suppression.     *05/23/2013. Port-A-Cath placed via interventional radiology on the right side on 05/23/2013.   *05/30/2013.  PET scan following cycle #1 shows only uptake in bilateral inguinal lymph nodes that are not really significantly enlarged. The patient has had a striking regression of tumor in her lungs, at least 90%. There has also been a decrease in the liver masses. None of those areas showed increased uptake, interestingly enough.   *09/05/2013. Completed 6 of 6 planned cycles for CHOEP on .  Cycle #5 was complicated by ICU-level care secondary to severe hyperglycemia (see prior note for details).  Cycle #6, prednisone was excluded.  She received neulasta throughout her therapy.   * 01/18/2014. She underwent autologous SCT on  at [facility] ([place]).    *04/27/2014. Day +100 PET scan no FGD avid masses.  Left breast nodule stable in size appearance (noted  in several previous mammograms).  REVIEW OF SYSTEMS:   Constitutional: Denies fevers, chills or abnormal weight loss Eyes: Denies blurriness of vision Ears, nose, mouth, throat, and face: Denies mucositis or sore throat Respiratory: Denies cough, dyspnea or wheezes Cardiovascular: Denies palpitation, chest discomfort or lower extremity swelling Gastrointestinal:  Denies nausea, heartburn or change  in bowel habits Skin: Denies abnormal skin rashes Lymphatics: Denies new lymphadenopathy or easy bruising Neurological:Denies numbness, tingling or new weaknesses Behavioral/Psych: Mood is stable, no new changes  All other systems were reviewed with the patient and are negative.  PHYSICAL EXAMINATION: ECOG PERFORMANCE STATUS: 0 - Asymptomatic  Blood pressure 136/80, pulse 65, temperature 98.3 F (36.8 C), temperature source Oral, resp. rate 18, height 5' 8"  (1.727 m), weight 249 lb 14.4 oz (113.354 kg), SpO2 100.00%.  GENERAL:alert, no distress and comfortable; Alopecia SKIN: skin color, texture, turgor are normal, no rashes or significant lesions; R port a cath in place.   EYES: normal, Conjunctiva are pink and non-injected, sclera clear OROPHARYNX:no exudate, no erythema and lips, buccal mucosa, and tongue normal  NECK: supple, thyroid normal size, non-tender, without nodularity LYMPH:  no palpable lymphadenopathy in the cervical, axillary or supraclavicular; s/p left inguinal biopsy healed.  LUNGS: clear to auscultation and percussion with normal breathing effort HEART: regular rate & rhythm and no murmurs and no lower extremity edema ABDOMEN:abdomen soft, non-tender and normal bowel sounds Musculoskeletal:no cyanosis of digits and no clubbing; darkening of both hands bilaterally with some dry skin.    NEURO: alert & oriented x 3 with fluent speech, no focal motor/sensory deficits   Labs:  Lab Results  Component Value Date   WBC 5.3 10/04/2014   HGB 11.8 10/04/2014   HCT 37.5 10/04/2014   MCV 83.9 10/04/2014   PLT 172 10/04/2014   NEUTROABS 2.9 10/04/2014      Chemistry      Component Value Date/Time   NA 142 07/26/2014 1440   NA 146* 06/28/2014 1415   K 4.4 07/26/2014 1440   K 4.1 06/28/2014 1415   CL 106 07/26/2014 1440   CO2 23 07/26/2014 1440   CO2 27 06/28/2014 1415   BUN 17 07/26/2014 1440   BUN 17.8 06/28/2014 1415   CREATININE 0.88 07/26/2014 1440   CREATININE 1.1  06/28/2014 1415      Component Value Date/Time   CALCIUM 9.9 07/26/2014 1440   CALCIUM 9.8 06/28/2014 1415   ALKPHOS 62 07/26/2014 1440   ALKPHOS 62 06/28/2014 1415   AST 27 07/26/2014 1440   AST 21 06/28/2014 1415   ALT 28 07/26/2014 1440   ALT 33 06/28/2014 1415   BILITOT 0.4 07/26/2014 1440   BILITOT 0.36 06/28/2014 1415     RADIOGRAPHIC STUDIES:  CT CHEST AND ABDOMEN WITHOUT CONTRAST  TECHNIQUE: Multidetector CT imaging of the chest and abdomen was performed following the standard protocol without intravenous contrast. COMPARISON: PET 04/24/2014. FINDINGS: CT CHEST FINDINGS  Right IJ Port-A-Cath terminates in the high right atrium.  Mediastinal lymph nodes are not enlarged by CT size criteria. Hilar regions are difficult to definitively evaluate without IV contrast but appear grossly unremarkable. No axillary adenopathy. Heart size normal. No pericardial effusion. A 1 cm soft tissue nodule in the lateral left breast is unchanged. Mild scattered upper lobe predominant peribronchovascular nodularity is likely postinfectious in etiology. No discrete dominant pulmonary nodule. Minimal scarring in the right lower lobe, adjacent to elevated right hemidiaphragm. No pleural fluid. Airway is unremarkable.  CT ABDOMEN FINDINGS Liver measures 20.7 cm.  Liver, gallbladder, adrenal glands, kidneys, spleen, pancreas, stomach and visualized bowel are otherwise grossly  unremarkable. No pathologically enlarged lymph nodes. No free fluid. No worrisome lytic or sclerotic lesions. IMPRESSION: 1. No evidence of recurrent lymphoma.  2. 1 cm soft tissue nodule in the lateral left breast is unchanged. 3. Mild hepatomegaly.   ASSESSMENT: Megan Mcknight 53 y.o. female with a history of Anaplastic ALK-positive large cell lymphoma - Plan: DTaP-hepatitis B recombinant-IPV (PEDIARIX) injection 0.5 mL, Influenza vac split quadrivalent PF (FLUARIX) injection 0.5 mL, CBC with Differential, Comprehensive metabolic panel (Cmet) -  [facility], Lactate dehydrogenase (LDH) - [facility], haemophilus B polysaccharide conjugate vaccine (ActHIB) injection 0.5 mL, pneumococcal 13-valent conjugate vaccine (PREVNAR 13) injection 0.5 mL, DISCONTINUED: pneumococcal 23 valent vaccine (PNU-IMMUNE) injection 0.5 mL  PLAN:  1. Stage IV Anaplastic Large-cell lymphoma (ALCL) CD30+, ALK+, CD20(-).  --She underwent high dose BEAM autologous SCT on 02/04 (day #0) and continues to recover well.  Her counts have recovered.  She denies any fevers or chills or acute shortness of breath. Her CT of C/A/P is negative for recurrence.  Seen by Dr. Cassell Clement on 08/13 who regains to being post transplant immunizations as follows: 8 months -- PCV13, DTaP, HIB, IPV, Hep B; 10 months -- PCV13, DTaP; HIB, IPV and Hep B; and 18 months -- Hep B PCP 23; and at 24 months MMR.  She recommended to discontinue antibiotics for PCP prophylaxis and to continue antiviral therapy for the suppression of HSV and VZV.   --  She is getting a CT scan of chest/abdomen and pelvis for disease assessment every 3-4 months for the first year and every 6 months fot the second year s/p SCT and yearly thereafter until 5 years post transplant with results forwarded to [facility] prior to her next appointment. Her next scan is being [facility] at [facility] so I won't order one in November 1017.  2. 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 --Patient is following with opthalmology for further evaluation and management. She will follow up with Dr. Zigmund Daniel prn.  3. Weight gain. --Counseled extensively on dieting and exercise.   4. Follow-up. --Patient should follow up  in 8 weeks months for a symptom visit vaccinations as noted above.  Continue port-a-cath flushes every 2 months.   All questions were answered. The patient knows to call the clinic with any problems, questions or concerns. We can certainly see the patient much sooner if necessary.  I spent 15 minutes counseling the patient face to face. The total time  spent in the appointment was 25 minutes.    Bernadene Bell, MD Medical Hematologist/Oncologist [place] Pager: [phone] Office No: [phone]

## 2014-10-05 ENCOUNTER — Encounter: Payer: Self-pay | Admitting: *Deleted

## 2014-10-05 NOTE — Progress Notes (Signed)
Received call from patient stating, "tell Dr. Lona Kettle that my Acyclovir is 800 mg. Also, my next appointment at [facility] is 10/24/2014." I informed patient that this note will be given to MD. Patient verbalized understanding.

## 2014-10-26 ENCOUNTER — Telehealth: Payer: Self-pay

## 2014-10-26 ENCOUNTER — Encounter: Payer: Self-pay | Admitting: Hematology

## 2014-10-26 NOTE — Telephone Encounter (Signed)
Will mail letter for return to work permission to the pt.

## 2014-10-27 ENCOUNTER — Ambulatory Visit (HOSPITAL_COMMUNITY)
Admission: RE | Admit: 2014-10-27 | Discharge: 2014-10-27 | Disposition: A | Discharge status: Home / Self Care | Payer: PRIVATE HEALTH INSURANCE | Source: Ambulatory Visit | Attending: Internal Medicine | Admitting: Internal Medicine

## 2014-10-27 DIAGNOSIS — Z1231 Encounter for screening mammogram for malignant neoplasm of breast: Secondary | ICD-10-CM | POA: Diagnosis not present

## 2014-11-21 ENCOUNTER — Telehealth: Payer: Self-pay | Admitting: Oncology

## 2014-11-21 NOTE — Telephone Encounter (Signed)
moved appt from CP1 12/16 to FS 12/30. lmonvm for pt on cell/home and mailed schedule.

## 2014-11-29 ENCOUNTER — Ambulatory Visit: Payer: PRIVATE HEALTH INSURANCE

## 2014-11-29 ENCOUNTER — Other Ambulatory Visit: Payer: PRIVATE HEALTH INSURANCE

## 2014-12-13 ENCOUNTER — Ambulatory Visit (HOSPITAL_BASED_OUTPATIENT_CLINIC_OR_DEPARTMENT_OTHER): Payer: PRIVATE HEALTH INSURANCE | Admitting: Oncology

## 2014-12-13 ENCOUNTER — Telehealth: Payer: Self-pay | Admitting: Oncology

## 2014-12-13 ENCOUNTER — Other Ambulatory Visit (HOSPITAL_BASED_OUTPATIENT_CLINIC_OR_DEPARTMENT_OTHER): Payer: PRIVATE HEALTH INSURANCE

## 2014-12-13 ENCOUNTER — Ambulatory Visit (HOSPITAL_BASED_OUTPATIENT_CLINIC_OR_DEPARTMENT_OTHER): Payer: PRIVATE HEALTH INSURANCE

## 2014-12-13 VITALS — BP 125/68 | HR 73 | Temp 98.1°F | Resp 19 | Ht 68.0 in | Wt 251.1 lb

## 2014-12-13 DIAGNOSIS — Z452 Encounter for adjustment and management of vascular access device: Secondary | ICD-10-CM

## 2014-12-13 DIAGNOSIS — C8589 Other specified types of non-Hodgkin lymphoma, extranodal and solid organ sites: Secondary | ICD-10-CM

## 2014-12-13 DIAGNOSIS — C8460 Anaplastic large cell lymphoma, ALK-positive, unspecified site: Secondary | ICD-10-CM

## 2014-12-13 DIAGNOSIS — Z95828 Presence of other vascular implants and grafts: Secondary | ICD-10-CM

## 2014-12-13 DIAGNOSIS — C8299 Follicular lymphoma, unspecified, extranodal and solid organ sites: Secondary | ICD-10-CM

## 2014-12-13 LAB — CBC WITH DIFFERENTIAL/PLATELET
BASO%: 0.4 % (ref 0.0–2.0)
Basophils Absolute: 0 10*3/uL (ref 0.0–0.1)
EOS%: 2.7 % (ref 0.0–7.0)
Eosinophils Absolute: 0.1 10*3/uL (ref 0.0–0.5)
HCT: 36.5 % (ref 34.8–46.6)
HGB: 11.6 g/dL (ref 11.6–15.9)
LYMPH%: 44.4 % (ref 14.0–49.7)
MCH: 27 pg (ref 25.1–34.0)
MCHC: 31.8 g/dL (ref 31.5–36.0)
MCV: 84.9 fL (ref 79.5–101.0)
MONO#: 0.4 10*3/uL (ref 0.1–0.9)
MONO%: 8.5 % (ref 0.0–14.0)
NEUT#: 2.3 10*3/uL (ref 1.5–6.5)
NEUT%: 44 % (ref 38.4–76.8)
Platelets: 146 10*3/uL (ref 145–400)
RBC: 4.3 10*6/uL (ref 3.70–5.45)
RDW: 15.6 % — ABNORMAL HIGH (ref 11.2–14.5)
WBC: 5.2 10*3/uL (ref 3.9–10.3)
lymph#: 2.3 10*3/uL (ref 0.9–3.3)

## 2014-12-13 LAB — COMPREHENSIVE METABOLIC PANEL (CC13)
ALT: 22 U/L (ref 0–55)
AST: 22 U/L (ref 5–34)
Albumin: 3.8 g/dL (ref 3.5–5.0)
Alkaline Phosphatase: 54 U/L (ref 40–150)
Anion Gap: 9 mEq/L (ref 3–11)
BUN: 12.9 mg/dL (ref 7.0–26.0)
CO2: 27 mEq/L (ref 22–29)
Calcium: 9.1 mg/dL (ref 8.4–10.4)
Chloride: 108 mEq/L (ref 98–109)
Creatinine: 0.9 mg/dL (ref 0.6–1.1)
EGFR: 88 mL/min/{1.73_m2} — ABNORMAL LOW (ref >90)
Glucose: 130 mg/dl (ref 70–140)
Potassium: 4 mEq/L (ref 3.5–5.1)
Sodium: 144 mEq/L (ref 136–145)
Total Bilirubin: 0.54 mg/dL (ref 0.20–1.20)
Total Protein: 6.8 g/dL (ref 6.4–8.3)

## 2014-12-13 LAB — LACTATE DEHYDROGENASE (CC13): LDH: 189 U/L (ref 125–245)

## 2014-12-13 MED ORDER — SODIUM CHLORIDE 0.9 % IJ SOLN
10.0000 mL | INTRAMUSCULAR | Status: DC | PRN
  Administered 2014-12-13: 10 mL via INTRAVENOUS
  Filled 2014-12-13: qty 10

## 2014-12-13 MED ORDER — HEPARIN SOD (PORK) LOCK FLUSH 100 UNIT/ML IV SOLN
500.0000 [IU] | Freq: Once | INTRAVENOUS | Status: AC
  Administered 2014-12-13: 500 [IU] via INTRAVENOUS
  Filled 2014-12-13: qty 5

## 2014-12-13 NOTE — Telephone Encounter (Signed)
gv adn printed appt sched and avs for pt for Feb and March 2016

## 2014-12-13 NOTE — Progress Notes (Signed)
[place] ONCOLOGY OFFICE PROGRESS NOTE DATE OF VISIT: 10/05/2015  [facility], MD [address] [place] [address]  DIAGNOSIS: 53 year old woman with anaplastic large cell lymphoma CD30 and ALK positive, CD20 negative diagnosed in 2014.   PRIOR THERAPY: CHOEP q 21 days started on 05/20/2013.  Completed 6 of 6 planned cycles for CHOEP on 09/05/2013.  Cycle #5 was complicated by ICU-level care secondary to severe hyperglycemia (see prior note for details).  Cycle #6, prednisone was excluded.  She received neulasta throughout her therapy.  She underwent autologous SCT on February 04th 2015 at [facility] ([place]).    CURRENT THERAPY:  Active surveillance.  INTERVAL HISTORY: [name] presents today for a follow-up visit. Instill last visit, she continues to do very well without any issues. She resumed work-related duties without any decline. Her appetite had been excellent and she have gained weight. She denies any nausea/vomiting/fever or chills/emergency room visits/hospitalizations. He reports continued out lateral hand tingling and numbness that is stable.  He denies any chest pain, palpitations, worsening fatigue.  She has some signs of her hair regrowth.  She denies any pain. She denied cough. She denied a mucositis or night sweats. She does not report any change in her bowel habits. She does not report any skeletal complaints. Rest of her review of systems unremarkable.  MEDICAL HISTORY: Past Medical History  Diagnosis Date  . Hyperlipidemia   . Nerve pain     Left leg  . Seasonal allergies   . Acute bronchitis   . Colon polyps 02/2013    Per colonoscopy  . Diverticulosis 02/2013    Per colonoscopy  . Former light tobacco smoker   . Dysrhythmia   . Cancer     lung and kidney  . Anaplastic large cell lymphoma   . Asthma   . GERD (gastroesophageal reflux disease)   . Diabetes mellitus without complication     ALLERGIES:  is allergic to  shellfish-derived products.  MEDICATIONS: has a current medication list which includes the following prescription(s): acyclovir, amlodipine, ergocalciferol, folic acid, insulin glargine, lansoprazole, lidocaine-prilocaine, metformin, metoprolol tartrate, multiple vitamin, and rosuvastatin.    PHYSICAL EXAMINATION: ECOG PERFORMANCE STATUS: 0 - Asymptomatic  Blood pressure 125/68, pulse 73, temperature 98.1 F (36.7 C), temperature source Oral, resp. rate 19, height 5' 8"  (1.727 m), weight 251 lb 1.6 oz (113.898 kg), SpO2 98 %.  GENERAL:alert, no distress and comfortable. Not in any distress. SKIN: skin color, texture, turgor are normal, no rashes or significant lesions; R port a cath in place.   EYES: normal, Conjunctiva are pink and non-injected, sclera clear OROPHARYNX:no exudate, no erythema.  NECK: supple, thyroid normal size, non-tender, without nodularity LYMPH:  no palpable lymphadenopathy in the cervical, axillary or supraclavicular; s/p left inguinal biopsy healed.  LUNGS: clear to auscultation and percussion with normal breathing effort HEART: regular rate & rhythm and no murmurs and no lower extremity edema ABDOMEN:abdomen soft, non-tender and normal bowel sounds Musculoskeletal:no cyanosis of digits and no clubbing; darkening of both hands bilaterally with some dry skin.    NEURO: No deficits.   Labs:  Lab Results  Component Value Date   WBC 5.2 12/13/2014   HGB 11.6 12/13/2014   HCT 36.5 12/13/2014   MCV 84.9 12/13/2014   PLT 146 12/13/2014   NEUTROABS 2.3 12/13/2014      Chemistry      Component Value Date/Time   NA 144 10/04/2014 1238   NA 142 07/26/2014 1440  K 3.8 10/04/2014 1238   K 4.4 07/26/2014 1440   CL 106 07/26/2014 1440   CO2 26 10/04/2014 1238   CO2 23 07/26/2014 1440   BUN 15.2 10/04/2014 1238   BUN 17 07/26/2014 1440   CREATININE 0.9 10/04/2014 1238   CREATININE 0.88 07/26/2014 1440      Component Value Date/Time   CALCIUM 9.6 10/04/2014  1238   CALCIUM 9.9 07/26/2014 1440   ALKPHOS 62 07/26/2014 1440   ALKPHOS 62 06/28/2014 1415   AST 27 07/26/2014 1440   AST 21 06/28/2014 1415   ALT 28 07/26/2014 1440   ALT 33 06/28/2014 1415   BILITOT 0.4 07/26/2014 1440   BILITOT 0.36 06/28/2014 1415       ASSESSMENT: Megan Mcknight 53 y.o. female with a history of Nodular lymphoma of extranodal and/or solid organ site - Plan: CBC with Differential, Comprehensive metabolic panel  PLAN:  1. Stage IV Anaplastic Large-cell lymphoma (ALCL) CD30+, ALK+, CD20(-).  --She underwent high dose BEAM autologous SCT on 02/04 (day #0) and continues to recover well. She continues to do very well clinically and her last CT scan done at [facility] continue to showed evidence of disease. We'll continue with active surveillance and a visit in 3 months.  2. 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 --Patient is following with opthalmology for further evaluation and management. She will follow up with Dr. Zigmund Daniel prn.  3. Port-A-Cath management: This will be flushed every 6 weeks.  4. Follow-up: Will be in 3 months.    [facility] MD 12/13/2014

## 2014-12-13 NOTE — Patient Instructions (Signed)

## 2015-01-25 ENCOUNTER — Telehealth: Payer: Self-pay | Admitting: Oncology

## 2015-01-25 NOTE — Telephone Encounter (Signed)
Pt called to cancelled her flush, states she had it done at [facility].... KJ

## 2015-03-07 ENCOUNTER — Ambulatory Visit (HOSPITAL_BASED_OUTPATIENT_CLINIC_OR_DEPARTMENT_OTHER): Payer: PRIVATE HEALTH INSURANCE

## 2015-03-07 ENCOUNTER — Telehealth: Payer: Self-pay | Admitting: Oncology

## 2015-03-07 ENCOUNTER — Ambulatory Visit (HOSPITAL_BASED_OUTPATIENT_CLINIC_OR_DEPARTMENT_OTHER): Payer: PRIVATE HEALTH INSURANCE | Admitting: Oncology

## 2015-03-07 ENCOUNTER — Other Ambulatory Visit (HOSPITAL_BASED_OUTPATIENT_CLINIC_OR_DEPARTMENT_OTHER): Payer: PRIVATE HEALTH INSURANCE

## 2015-03-07 VITALS — BP 114/70 | HR 72 | Temp 99.1°F | Resp 19 | Ht 68.0 in | Wt 253.4 lb

## 2015-03-07 DIAGNOSIS — C8468 Anaplastic large cell lymphoma, ALK-positive, lymph nodes of multiple sites: Secondary | ICD-10-CM

## 2015-03-07 DIAGNOSIS — E1139 Type 2 diabetes mellitus with other diabetic ophthalmic complication: Secondary | ICD-10-CM | POA: Diagnosis not present

## 2015-03-07 DIAGNOSIS — Z95828 Presence of other vascular implants and grafts: Secondary | ICD-10-CM

## 2015-03-07 DIAGNOSIS — C8590 Non-Hodgkin lymphoma, unspecified, unspecified site: Secondary | ICD-10-CM

## 2015-03-07 DIAGNOSIS — C8299 Follicular lymphoma, unspecified, extranodal and solid organ sites: Secondary | ICD-10-CM

## 2015-03-07 LAB — COMPREHENSIVE METABOLIC PANEL (CC13)
ALT: 30 U/L (ref 0–55)
AST: 27 U/L (ref 5–34)
Albumin: 4.1 g/dL (ref 3.5–5.0)
Alkaline Phosphatase: 53 U/L (ref 40–150)
Anion Gap: 8 mEq/L (ref 3–11)
BUN: 19.2 mg/dL (ref 7.0–26.0)
CO2: 26 mEq/L (ref 22–29)
Calcium: 10.1 mg/dL (ref 8.4–10.4)
Chloride: 110 mEq/L — ABNORMAL HIGH (ref 98–109)
Creatinine: 0.8 mg/dL (ref 0.6–1.1)
EGFR: >90 mL/min/{1.73_m2} (ref >90)
Glucose: 101 mg/dl (ref 70–140)
Potassium: 3.6 mEq/L (ref 3.5–5.1)
Sodium: 144 mEq/L (ref 136–145)
Total Bilirubin: 0.36 mg/dL (ref 0.20–1.20)
Total Protein: 7.2 g/dL (ref 6.4–8.3)

## 2015-03-07 LAB — CBC WITH DIFFERENTIAL/PLATELET
BASO%: 1.1 % (ref 0.0–2.0)
Basophils Absolute: 0.1 10*3/uL (ref 0.0–0.1)
EOS%: 2.6 % (ref 0.0–7.0)
Eosinophils Absolute: 0.2 10*3/uL (ref 0.0–0.5)
HCT: 37 % (ref 34.8–46.6)
HGB: 11.9 g/dL (ref 11.6–15.9)
LYMPH%: 42.5 % (ref 14.0–49.7)
MCH: 26.8 pg (ref 25.1–34.0)
MCHC: 32.1 g/dL (ref 31.5–36.0)
MCV: 83.6 fL (ref 79.5–101.0)
MONO#: 0.5 10*3/uL (ref 0.1–0.9)
MONO%: 6.2 % (ref 0.0–14.0)
NEUT#: 3.6 10*3/uL (ref 1.5–6.5)
NEUT%: 47.6 % (ref 38.4–76.8)
Platelets: 193 10*3/uL (ref 145–400)
RBC: 4.43 10*6/uL (ref 3.70–5.45)
RDW: 15 % — ABNORMAL HIGH (ref 11.2–14.5)
WBC: 7.6 10*3/uL (ref 3.9–10.3)
lymph#: 3.2 10*3/uL (ref 0.9–3.3)

## 2015-03-07 MED ORDER — SODIUM CHLORIDE 0.9 % IJ SOLN
10.0000 mL | INTRAMUSCULAR | Status: DC | PRN
Start: 2015-03-07 — End: 2015-03-07
  Administered 2015-03-07: 10 mL via INTRAVENOUS
  Filled 2015-03-07: qty 10

## 2015-03-07 MED ORDER — HEPARIN SOD (PORK) LOCK FLUSH 100 UNIT/ML IV SOLN
500.0000 [IU] | Freq: Once | INTRAVENOUS | Status: AC
  Administered 2015-03-07: 500 [IU] via INTRAVENOUS
  Filled 2015-03-07: qty 5

## 2015-03-07 NOTE — Progress Notes (Signed)
[place], MD [address] 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   PRIOR THERAPY:  CHOEP q 21 days started on 05/20/2013.  Completed 6 of 6 planned cycles for CHOEP on 09/05/2013.  Cycle #5 was complicated by ICU-level care secondary to severe hyperglycemia (see prior note for details).  Cycle #6, prednisone was excluded.  She received neulasta throughout her therapy.   She underwent autologous SCT on February 04th 2015 at [facility] ([facility]).    CURRENT THERAPY:  Active surveillance.  INTERVAL HISTORY: Ms. Birenbaum presents today for a follow-up visit. Since the last visit, she continues to do very well without any issues. She resumed work-related duties without any decline. Her appetite had been excellent and she have gained weight. She denies any nausea/vomiting/fever or chills/emergency room visits/hospitalizations. He denies any chest pain, palpitations, worsening fatigue.  She has some signs of her hair regrowth.  She denies any pain. She denied cough. She denied a mucositis or night sweats. She does not report any headaches, blurry vision, double vision or syncope. She does not report any cough, wheezing or hemoptysis. She does not report any constipation, diarrhea or hematochezia. She does not report any frequency urgency or hesitancy. The remaining review of systems unremarkable.  MEDICAL HISTORY: Past Medical History  Diagnosis Date  . Hyperlipidemia   . Nerve pain     Left leg  . Seasonal allergies   . Acute bronchitis   . Colon polyps 02/2013    Per colonoscopy  . Diverticulosis 02/2013    Per colonoscopy  . Former light tobacco smoker   . Dysrhythmia   . Cancer     lung and kidney  . Anaplastic large cell lymphoma   . Asthma   . GERD (gastroesophageal reflux disease)   . Diabetes mellitus  without complication     ALLERGIES:  is allergic to shellfish-derived products.  MEDICATIONS: has a current medication list which includes the following prescription(s): acyclovir, amlodipine, ergocalciferol, folic acid, insulin glargine, lansoprazole, lidocaine-prilocaine, metformin, metoprolol tartrate, multiple vitamin, and rosuvastatin.    PHYSICAL EXAMINATION: ECOG PERFORMANCE STATUS: 0 - Asymptomatic  Blood pressure 114/70, pulse 72, temperature 99.1 F (37.3 C), temperature source Oral, resp. rate 19, height 5' 8"  (1.727 m), weight 253 lb 6.4 oz (114.941 kg), SpO2 98 %.  GENERAL:alert, no distress and comfortable. SKIN: skin color, texture, turgor are normal, no rashes or significant lesions; R port a cath in place.   EYES: normal, Conjunctiva are pink and non-injected, sclera clear OROPHARYNX:no exudate, no erythema.  NECK: supple, thyroid normal size, non-tender.  LYMPH:  no palpable lymphadenopathy in the cervical, axillary or supraclavicular; s/p left inguinal biopsy healed.  LUNGS: clear to auscultation and percussion with normal breathing effort HEART: regular rate & rhythm and no murmurs and no lower extremity edema ABDOMEN:abdomen soft, non-tender and normal bowel sounds Musculoskeletal:no cyanosis of digits and no clubbing; darkening of both hands bilaterally with some dry skin.    NEURO: No deficits.   Labs:  Lab Results  Component Value Date   WBC 5.2 12/13/2014   HGB 11.6 12/13/2014   HCT 36.5 12/13/2014   MCV 84.9 12/13/2014   PLT 146 12/13/2014   NEUTROABS 2.3 12/13/2014      Chemistry      Component Value Date/Time   NA 144 12/13/2014 1057   NA 142 07/26/2014  1440   K 4.0 12/13/2014 1057   K 4.4 07/26/2014 1440   CL 106 07/26/2014 1440   CO2 27 12/13/2014 1057   CO2 23 07/26/2014 1440   BUN 12.9 12/13/2014 1057   BUN 17 07/26/2014 1440   CREATININE 0.9 12/13/2014 1057   CREATININE 0.88 07/26/2014 1440      Component Value Date/Time    CALCIUM 9.1 12/13/2014 1057   CALCIUM 9.9 07/26/2014 1440   ALKPHOS 54 12/13/2014 1057   ALKPHOS 62 07/26/2014 1440   AST 22 12/13/2014 1057   AST 27 07/26/2014 1440   ALT 22 12/13/2014 1057   ALT 28 07/26/2014 1440   BILITOT 0.54 12/13/2014 1057   BILITOT 0.4 07/26/2014 1440       ASSESSMENT AND PLAN:  54 year old woman with the following issues:    1. Stage IV Anaplastic Large-cell lymphoma (ALCL) CD30+, ALK+, CD20(-).  She underwent high dose BEAM autologous SCT on 01/18/2014 (day #0) and have recovered well. She is getting active surveillance CT scans at [facility]. Her last CT scan on 01/24/2015 showed no evidence of any lymphadenopathy. She continues to have slight hepatomegaly. But no evidence of any disease at this time. The plan is to continue active surveillance at this time.  2. 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 Patient is following with opthalmology for further evaluation and management. She will follow up with Dr. Zigmund Daniel prn.  3. Port-A-Cath management:  This will be flushed every 6 weeks.  4. Follow-up:  Will be in 6 months.    [facility] MD 03/07/2015

## 2015-03-07 NOTE — Patient Instructions (Signed)

## 2015-03-07 NOTE — Telephone Encounter (Signed)
Gave avs & calendar for May thru September

## 2015-04-12 ENCOUNTER — Encounter: Payer: Self-pay | Admitting: Obstetrics and Gynecology

## 2015-04-12 ENCOUNTER — Ambulatory Visit (INDEPENDENT_AMBULATORY_CARE_PROVIDER_SITE_OTHER): Payer: PRIVATE HEALTH INSURANCE | Admitting: Obstetrics and Gynecology

## 2015-04-12 VITALS — BP 120/72 | Ht 69.0 in | Wt 250.0 lb

## 2015-04-12 DIAGNOSIS — Z01419 Encounter for gynecological examination (general) (routine) without abnormal findings: Secondary | ICD-10-CM | POA: Diagnosis not present

## 2015-04-12 NOTE — Addendum Note (Signed)
Addended by: Farley Ly on: 04/12/2015 11:40 AM   Modules accepted: Orders

## 2015-04-12 NOTE — Progress Notes (Signed)
Patient ID: Megan Mcknight, female   DOB: Aug 05, 1961, 54 y.o.   MRN: [id number] Pt here today for annual exam. Pt has had a partial hysterectomy. Pt states that her oncologist wants her to have a pap smear. Pt denies any problems or concerns at this time.

## 2015-04-12 NOTE — Progress Notes (Signed)
Patient ID: Megan Mcknight, female   DOB: 03/26/1961, 54 y.o.   MRN: [id number]  This chart was scribed for Jonnie Kind, MD by Donato Schultz, ED Scribe. This patient was seen in Room 1 and the patient's care was started at 11:15 AM.   Assessment:  Annual Gyn Exam   Plan:  1. pap smear not done due to history of vagina hysterectomy and large cell lymphoma 2. return annually or prn 3    Annual mammogram advised Subjective:  Megan Mcknight is a 54 y.o. female s/p vaginal hysterectomy and posterior repair who presents for annual exam. No obstetric history on file. No LMP recorded. Patient has a history of large cell lymphoma and has been in remission for a year.  She was recommended by her oncologist, Dr. Melody Comas, to have a pap smear today.  Her last pap smear was more than 10 years ago.  Her last mammogram was in November 2015.  She had a colonoscopy 3 years ago.  The patient has no complaints today.    The following portions of the patient's history were reviewed and updated as appropriate: allergies, current medications, past family history, past medical history, past social history, past surgical history and problem list. Past Medical History  Diagnosis Date  . Hyperlipidemia   . Nerve pain     Left leg  . Seasonal allergies   . Acute bronchitis   . Colon polyps 02/2013    Per colonoscopy  . Diverticulosis 02/2013    Per colonoscopy  . Former light tobacco smoker   . Dysrhythmia   . Cancer     lung and kidney  . Anaplastic large cell lymphoma   . Asthma   . GERD (gastroesophageal reflux disease)   . Diabetes mellitus without complication     Past Surgical History  Procedure Laterality Date  . Partial hysterectomy    . Bones removed from baby toes    . Colonoscopy  02/16/2012    Procedure: COLONOSCOPY;  Surgeon: Daneil Dolin, MD;  [facility];  Service: Endoscopy;  Laterality: N/A;  1:30  . Video bronchoscopy with endobronchial ultrasound N/A 05/13/2013    Procedure:  VIDEO BRONCHOSCOPY WITH ENDOBRONCHIAL ULTRASOUND;  Surgeon: Melrose Nakayama, MD;  Location: [facility];  Service: Thoracic;  Laterality: N/A;  . Mediastinoscopy N/A 05/13/2013    Procedure: MEDIASTINOSCOPY;  Surgeon: Melrose Nakayama, MD;  Location: [facility];  Service: Thoracic;  Laterality: N/A;  . Vaginal hysterectomy      partial hyst      Current outpatient prescriptions:  .  amLODipine (NORVASC) 5 MG tablet, Take 5 mg by mouth daily., Disp: , Rfl:  .  insulin glargine (LANTUS) 100 UNIT/ML injection, Inject 40 Units into the skin as needed., Disp: , Rfl:  .  lansoprazole (PREVACID) 15 MG capsule, Take 15 mg by mouth as needed., Disp: , Rfl:  .  metFORMIN (GLUCOPHAGE) 500 MG tablet, Take by mouth 2 (two) times daily with a meal., Disp: , Rfl:  .  metoprolol tartrate (LOPRESSOR) 25 MG tablet, Take 25 mg by mouth 3 (three) times daily., Disp: , Rfl:  .  montelukast (SINGULAIR) 10 MG tablet, Take 10 mg by mouth at bedtime., Disp: , Rfl:  .  rosuvastatin (CRESTOR) 10 MG tablet, Take 10 mg by mouth daily., Disp: , Rfl:   Review of Systems Constitutional: negative Gastrointestinal: negative Genitourinary: negative  Objective:  BP 120/72 mmHg  Ht '5\' 9"'$  (1.753 m)  Wt 250  lb (113.399 kg)  BMI 36.90 kg/m2   BMI: Body mass index is 36.9 kg/(m^2).  General Appearance: Alert, appropriate appearance for age. No acute distress HEENT: Grossly normal Neck / Thyroid:  Cardiovascular: RRR; normal S1, S2, no murmur Lungs: CTA bilaterally Back: No CVAT Breast Exam: Mobile firmness about 4cm in diameter in the left outer quadrant; it is being monitored routinely Gastrointestinal: Soft, non-tender, no masses or organomegaly Pelvic Exam: External genitalia: normal general appearance Vaginal: normal without tenderness, induration or masses, well supported, smooth Cervix: surgically absent Adnexa: normal bimanual exam, negative masses Uterus: surgically absent Rectovaginal: normal rectal, no  masses, good support Lymphatic Exam: Non-palpable nodes in neck, clavicular, axillary, or inguinal regions Skin: no rash or abnormalities Neurologic: Normal gait and speech, no tremor  Psychiatric: Alert and oriented, appropriate affect.  Urinalysis:Not done Hemoccult: Negative  Mallory Shirk. MD Pgr [phone] 11:15 AM

## 2015-04-18 ENCOUNTER — Ambulatory Visit (HOSPITAL_BASED_OUTPATIENT_CLINIC_OR_DEPARTMENT_OTHER): Payer: PRIVATE HEALTH INSURANCE

## 2015-04-18 VITALS — BP 130/68 | HR 65 | Temp 97.9°F | Resp 18

## 2015-04-18 DIAGNOSIS — Z452 Encounter for adjustment and management of vascular access device: Secondary | ICD-10-CM | POA: Diagnosis not present

## 2015-04-18 DIAGNOSIS — C8468 Anaplastic large cell lymphoma, ALK-positive, lymph nodes of multiple sites: Secondary | ICD-10-CM

## 2015-04-18 DIAGNOSIS — Z95828 Presence of other vascular implants and grafts: Secondary | ICD-10-CM

## 2015-04-18 MED ORDER — SODIUM CHLORIDE 0.9 % IJ SOLN
10.0000 mL | INTRAMUSCULAR | Status: DC | PRN
  Administered 2015-04-18: 10 mL via INTRAVENOUS
  Filled 2015-04-18: qty 10

## 2015-04-18 MED ORDER — HEPARIN SOD (PORK) LOCK FLUSH 100 UNIT/ML IV SOLN
500.0000 [IU] | Freq: Once | INTRAVENOUS | Status: AC
  Administered 2015-04-18: 500 [IU] via INTRAVENOUS
  Filled 2015-04-18: qty 5

## 2015-04-18 NOTE — Patient Instructions (Signed)

## 2015-05-23 ENCOUNTER — Ambulatory Visit (HOSPITAL_BASED_OUTPATIENT_CLINIC_OR_DEPARTMENT_OTHER): Payer: PRIVATE HEALTH INSURANCE

## 2015-05-23 DIAGNOSIS — Z452 Encounter for adjustment and management of vascular access device: Secondary | ICD-10-CM | POA: Diagnosis not present

## 2015-05-23 DIAGNOSIS — C8468 Anaplastic large cell lymphoma, ALK-positive, lymph nodes of multiple sites: Secondary | ICD-10-CM | POA: Diagnosis not present

## 2015-05-23 DIAGNOSIS — Z95828 Presence of other vascular implants and grafts: Secondary | ICD-10-CM

## 2015-05-23 MED ORDER — HEPARIN SOD (PORK) LOCK FLUSH 100 UNIT/ML IV SOLN
500.0000 [IU] | Freq: Once | INTRAVENOUS | Status: AC
  Administered 2015-05-23: 500 [IU] via INTRAVENOUS
  Filled 2015-05-23: qty 5

## 2015-05-23 MED ORDER — SODIUM CHLORIDE 0.9 % IJ SOLN
10.0000 mL | INTRAMUSCULAR | Status: DC | PRN
  Administered 2015-05-23: 10 mL via INTRAVENOUS
  Filled 2015-05-23: qty 10

## 2015-05-23 NOTE — Patient Instructions (Signed)

## 2015-06-11 ENCOUNTER — Telehealth: Payer: Self-pay | Admitting: *Deleted

## 2015-06-11 NOTE — Telephone Encounter (Signed)
Pt called stating she needed to see Dr. Alen Blew sooner than scheduled appts in Sept.  Stated she has had pain on Left side for about 2 weeks now.  Denied shortness of breath; stated able to breathe normally; denied any swelling.  Pt stated the pain is intermittent , not unbearable, but just there.  Pt would like to be seen soon. Informed pt that message will be relayed to Dr. Alen Blew for 06/12/15 since md was not in office today. Pt's  Phone   [phone].

## 2015-06-12 ENCOUNTER — Telehealth: Payer: Self-pay | Admitting: Oncology

## 2015-06-12 ENCOUNTER — Other Ambulatory Visit: Payer: Self-pay | Admitting: Oncology

## 2015-06-12 DIAGNOSIS — C8590 Non-Hodgkin lymphoma, unspecified, unspecified site: Secondary | ICD-10-CM

## 2015-06-12 NOTE — Telephone Encounter (Signed)
S/w pt confirming labs/flush before CT scan 06/30 .... Cherylann Banas

## 2015-06-14 ENCOUNTER — Ambulatory Visit (HOSPITAL_COMMUNITY)
Admission: RE | Admit: 2015-06-14 | Discharge: 2015-06-14 | Disposition: A | Discharge status: Home / Self Care | Payer: PRIVATE HEALTH INSURANCE | Source: Ambulatory Visit | Attending: Oncology | Admitting: Oncology

## 2015-06-14 ENCOUNTER — Other Ambulatory Visit (HOSPITAL_BASED_OUTPATIENT_CLINIC_OR_DEPARTMENT_OTHER): Payer: PRIVATE HEALTH INSURANCE

## 2015-06-14 ENCOUNTER — Telehealth: Payer: Self-pay | Admitting: *Deleted

## 2015-06-14 ENCOUNTER — Ambulatory Visit (HOSPITAL_BASED_OUTPATIENT_CLINIC_OR_DEPARTMENT_OTHER): Payer: PRIVATE HEALTH INSURANCE

## 2015-06-14 ENCOUNTER — Encounter (HOSPITAL_COMMUNITY): Payer: Self-pay

## 2015-06-14 DIAGNOSIS — C8468 Anaplastic large cell lymphoma, ALK-positive, lymph nodes of multiple sites: Secondary | ICD-10-CM

## 2015-06-14 DIAGNOSIS — Z90710 Acquired absence of both cervix and uterus: Secondary | ICD-10-CM | POA: Insufficient documentation

## 2015-06-14 DIAGNOSIS — R109 Unspecified abdominal pain: Secondary | ICD-10-CM | POA: Diagnosis not present

## 2015-06-14 DIAGNOSIS — C8590 Non-Hodgkin lymphoma, unspecified, unspecified site: Secondary | ICD-10-CM

## 2015-06-14 DIAGNOSIS — Z95828 Presence of other vascular implants and grafts: Secondary | ICD-10-CM

## 2015-06-14 LAB — COMPREHENSIVE METABOLIC PANEL (CC13)
ALT: 20 U/L (ref 0–55)
AST: 18 U/L (ref 5–34)
Albumin: 3.8 g/dL (ref 3.5–5.0)
Alkaline Phosphatase: 52 U/L (ref 40–150)
Anion Gap: 8 mEq/L (ref 3–11)
BUN: 11.7 mg/dL (ref 7.0–26.0)
CO2: 24 mEq/L (ref 22–29)
Calcium: 9.2 mg/dL (ref 8.4–10.4)
Chloride: 110 mEq/L — ABNORMAL HIGH (ref 98–109)
Creatinine: 0.8 mg/dL (ref 0.6–1.1)
EGFR: >90 mL/min/{1.73_m2} (ref >90)
Glucose: 110 mg/dl (ref 70–140)
Potassium: 4 mEq/L (ref 3.5–5.1)
Sodium: 143 mEq/L (ref 136–145)
Total Bilirubin: 0.56 mg/dL (ref 0.20–1.20)
Total Protein: 6.6 g/dL (ref 6.4–8.3)

## 2015-06-14 LAB — CBC WITH DIFFERENTIAL/PLATELET
BASO%: 1.6 % (ref 0.0–2.0)
Basophils Absolute: 0.1 10*3/uL (ref 0.0–0.1)
EOS%: 2.2 % (ref 0.0–7.0)
Eosinophils Absolute: 0.1 10*3/uL (ref 0.0–0.5)
HCT: 37.6 % (ref 34.8–46.6)
HGB: 12.2 g/dL (ref 11.6–15.9)
LYMPH%: 46.6 % (ref 14.0–49.7)
MCH: 27 pg (ref 25.1–34.0)
MCHC: 32.4 g/dL (ref 31.5–36.0)
MCV: 83.3 fL (ref 79.5–101.0)
MONO#: 0.4 10*3/uL (ref 0.1–0.9)
MONO%: 6.6 % (ref 0.0–14.0)
NEUT#: 2.4 10*3/uL (ref 1.5–6.5)
NEUT%: 43 % (ref 38.4–76.8)
Platelets: 150 10*3/uL (ref 145–400)
RBC: 4.52 10*6/uL (ref 3.70–5.45)
RDW: 14.4 % (ref 11.2–14.5)
WBC: 5.5 10*3/uL (ref 3.9–10.3)
lymph#: 2.6 10*3/uL (ref 0.9–3.3)

## 2015-06-14 MED ORDER — IOHEXOL 300 MG/ML  SOLN: 100.0000 mL | Freq: Once | INTRAMUSCULAR | Status: AC | PRN

## 2015-06-14 MED ORDER — SODIUM CHLORIDE 0.9 % IJ SOLN
10.0000 mL | INTRAMUSCULAR | Status: DC | PRN
  Administered 2015-06-14: 10 mL via INTRAVENOUS
  Filled 2015-06-14: qty 10

## 2015-06-14 NOTE — Patient Instructions (Signed)
Patient to have port needle de-accessed after CT scan today before leaving [place] An implanted port is a type of central line that is placed under the skin. Central lines are used to provide IV access when treatment or nutrition needs to be given through a person's veins. Implanted ports are used for long-term IV access. An implanted port may be placed because:   You need IV medicine that would be irritating to the small veins in your hands or arms.   You need long-term IV medicines, such as antibiotics.   You need IV nutrition for a long period.   You need frequent blood draws for lab tests.   You need dialysis.  Implanted ports are usually placed in the chest area, but they can also be placed in the upper arm, the abdomen, or the leg. An implanted port has two main parts:   Reservoir. The reservoir is round and will appear as a small, raised area under your skin. The reservoir is the part where a needle is inserted to give medicines or draw blood.   Catheter. The catheter is a thin, flexible tube that extends from the reservoir. The catheter is placed into a large vein. Medicine that is inserted into the reservoir goes into the catheter and then into the vein.  HOW WILL I CARE FOR MY INCISION SITE? Do not get the incision site wet. Bathe or shower as directed by your health care provider.  HOW IS MY PORT ACCESSED? Special steps must be taken to access the port:   Before the port is accessed, a numbing cream can be placed on the skin. This helps numb the skin over the port site.   Your health care provider uses a sterile technique to access the port.  Your health care provider must put on a mask and sterile gloves.  The skin over your port is cleaned carefully with an antiseptic and allowed to dry.  The port is gently pinched between sterile gloves, and a needle is inserted into the port.  Only "non-coring" port needles should be used to access the  port. Once the port is accessed, a blood return should be checked. This helps ensure that the port is in the vein and is not clogged.   If your port needs to remain accessed for a constant infusion, a clear (transparent) bandage will be placed over the needle site. The bandage and needle will need to be changed every week, or as directed by your health care provider.   Keep the bandage covering the needle clean and dry. Do not get it wet. Follow your health care provider's instructions on how to take a shower or bath while the port is accessed.   If your port does not need to stay accessed, no bandage is needed over the port.  WHAT IS FLUSHING? Flushing helps keep the port from getting clogged. Follow your health care provider's instructions on how and when to flush the port. Ports are usually flushed with saline solution or a medicine called heparin. The need for flushing will depend on how the port is used.   If the port is used for intermittent medicines or blood draws, the port will need to be flushed:   After medicines have been given.   After blood has been drawn.   As part of routine maintenance.   If a constant infusion is running, the port may not need to be flushed.  HOW LONG WILL MY PORT  STAY IMPLANTED? The port can stay in for as long as your health care provider thinks it is needed. When it is time for the port to come out, surgery will be done to remove it. The procedure is similar to the one performed when the port was put in.  WHEN SHOULD I SEEK IMMEDIATE MEDICAL CARE? When you have an implanted port, you should seek immediate medical care if:   You notice a bad smell coming from the incision site.   You have swelling, redness, or drainage at the incision site.   You have more swelling or pain at the port site or the surrounding area.   You have a fever that is not controlled with medicine. Document Released: 12/01/2005 Document Revised: 09/21/2013 Document  Reviewed: 08/08/2013 [facility] Patient Information 2015 [place], [place]. This information is not intended to replace advice given to you by your health care provider. Make sure you discuss any questions you have with your health care provider.

## 2015-06-14 NOTE — Telephone Encounter (Signed)
-----   Message from Wyatt Portela, MD sent at 06/14/2015 10:42 AM EDT ----- Please let her know that her labs and CT scan completely normal. Her symptoms are not related to cancer.  We need to cancel her July flush appointment and keep the rest of her appointment as is.

## 2015-06-14 NOTE — Telephone Encounter (Signed)
Per Dr. Alen Blew, I left a message for the patient informing her that labs and CT Scan are completley normal and her symptoms are not related to cancer. Instructed the patient to call [place] if she had any questions or concerns. July flush appointment cancelled.

## 2015-07-09 ENCOUNTER — Telehealth: Payer: Self-pay | Admitting: Orthopedic Surgery

## 2015-07-09 NOTE — Telephone Encounter (Signed)
Patient called to request appointment for same foot injury from 02/15/11; patient was last seen here for this problem 10/02/11 -- last office note includes a permanent restriction and rating, and indicates that patient may need to be seen from time to time if symptoms increase.  Patient still is working for same employer, and states the foot has been hurting and swelling, although she still wears the orthotic recommended by Dr Aline Brochure at that time. She states workers comp claim is still open, and said that she can provide her attorney's  Please review and advise regarding scheduling.  Patient's ph# is [phone]

## 2015-07-17 NOTE — Telephone Encounter (Signed)
Called back to patient; appointment scheduled accordingly.

## 2015-07-17 NOTE — Telephone Encounter (Signed)
Sept 6th

## 2015-07-31 ENCOUNTER — Telehealth: Payer: Self-pay | Admitting: *Deleted

## 2015-07-31 NOTE — Telephone Encounter (Signed)
VM message received @ 1200pm from patient - requesting return call. TC to patient. She states she needs to cancel her flush appt for tomorrow as she had her port flushed @ [facility] last week. Flush appt cancelled for 08/01/15

## 2015-08-21 ENCOUNTER — Encounter: Payer: Self-pay | Admitting: Orthopedic Surgery

## 2015-08-21 ENCOUNTER — Ambulatory Visit (INDEPENDENT_AMBULATORY_CARE_PROVIDER_SITE_OTHER): Payer: PRIVATE HEALTH INSURANCE | Admitting: Orthopedic Surgery

## 2015-08-21 ENCOUNTER — Ambulatory Visit (INDEPENDENT_AMBULATORY_CARE_PROVIDER_SITE_OTHER): Payer: PRIVATE HEALTH INSURANCE

## 2015-08-21 ENCOUNTER — Ambulatory Visit: Payer: PRIVATE HEALTH INSURANCE

## 2015-08-21 VITALS — BP 124/81 | Ht 69.0 in | Wt 257.0 lb

## 2015-08-21 DIAGNOSIS — M79672 Pain in left foot: Secondary | ICD-10-CM | POA: Diagnosis not present

## 2015-08-21 DIAGNOSIS — M76829 Posterior tibial tendinitis, unspecified leg: Secondary | ICD-10-CM

## 2015-08-21 DIAGNOSIS — M6789 Other specified disorders of synovium and tendon, multiple sites: Secondary | ICD-10-CM

## 2015-08-21 NOTE — Patient Instructions (Signed)
Prescription given for Spenco warm and form orthotics

## 2015-08-21 NOTE — Progress Notes (Signed)
Patient ID: Megan Mcknight, female   DOB: 07-May-1961, 54 y.o.   MRN: [id number]   Chief Complaint  Patient presents with  . Foot Pain    re-eval left foot pain and swelling work related accident 02/15/12    Megan Mcknight is a 54 y.o. female.   HPI 54 year old female who presents after crush injury to her left foot back in 2013 presents with pain swelling dorsum of the foot in the posterior aspect of the ankle medially in also inferior to the medial malleolus with pain radiating up the back of the tibia associated with increased activity of moderate and mild severity now for several months.  Denies trauma  She has increased pain after standing for long periods  Review of systems related: Review of Systems  Constitutional: Negative for fever and chills.  Musculoskeletal: Positive for joint pain. Negative for falls.  Neurological: Positive for tingling and sensory change. Negative for focal weakness.    Review of Systems See hpi  Past Medical History  Diagnosis Date  . Hyperlipidemia   . Nerve pain     Left leg  . Seasonal allergies   . Acute bronchitis   . Colon polyps 02/2013    Per colonoscopy  . Diverticulosis 02/2013    Per colonoscopy  . Former light tobacco smoker   . Dysrhythmia   . Cancer     lung and kidney  . Anaplastic large cell lymphoma   . Asthma   . GERD (gastroesophageal reflux disease)   . Diabetes mellitus without complication     Allergies  Allergen Reactions  . Shellfish-Derived Products Anaphylaxis and Swelling    Eyes swelling, scratchy throat, bumps on lips.    Current Outpatient Prescriptions  Medication Sig Dispense Refill  . amLODipine (NORVASC) 5 MG tablet Take 5 mg by mouth daily.    . insulin glargine (LANTUS) 100 UNIT/ML injection Inject 40 Units into the skin as needed.    . lansoprazole (PREVACID) 15 MG capsule Take 15 mg by mouth as needed.    . metFORMIN (GLUCOPHAGE) 500 MG tablet Take by mouth 2 (two) times daily with a meal.    .  metoprolol tartrate (LOPRESSOR) 25 MG tablet Take 25 mg by mouth 3 (three) times daily.    . montelukast (SINGULAIR) 10 MG tablet Take 10 mg by mouth at bedtime.    . rosuvastatin (CRESTOR) 10 MG tablet Take 10 mg by mouth daily.     No current facility-administered medications for this visit.     Physical Exam Blood pressure 124/81, height '5\' 9"'$  (1.753 m), weight 257 lb (116.574 kg). Physical Exam  The patient is well developed well nourished and well groomed.   Orientation to person place and time is normal   Mood is pleasant.  Ambulatory status she walks with a slightly antalgic gait. She has a too many toes sign. She has bilateral flat feet with a flexible hindfoot.    She has decreased sensation on the dorsum of the foot with swelling along the medial malleolus posterior tibia and inferior malleolus medially. Ankle range of motion is approximately 25 which indicates some loss of motion ankle otherwise stable subtalar joint motion remains intact Achilles tendon nontender no weakness. Skin normal without rash laceration or ulceration    Opposite foot noted flat foot with no tenderness.  Data Reviewed X-rays 3 views left foot. I did the report as well please see for details but no degenerative changes of any significance. Diagnosis No further  workup   Encounter Diagnoses  Name Primary?  . Left foot pain   . PTTD (posterior tibial tendon dysfunction) Yes    Management Recommend supportive foot orthotics follow-up as needed

## 2015-09-05 ENCOUNTER — Telehealth: Payer: Self-pay | Admitting: Oncology

## 2015-09-05 ENCOUNTER — Ambulatory Visit (HOSPITAL_BASED_OUTPATIENT_CLINIC_OR_DEPARTMENT_OTHER): Payer: PRIVATE HEALTH INSURANCE

## 2015-09-05 ENCOUNTER — Telehealth: Payer: Self-pay | Admitting: *Deleted

## 2015-09-05 ENCOUNTER — Other Ambulatory Visit (HOSPITAL_BASED_OUTPATIENT_CLINIC_OR_DEPARTMENT_OTHER): Payer: PRIVATE HEALTH INSURANCE

## 2015-09-05 ENCOUNTER — Ambulatory Visit (HOSPITAL_BASED_OUTPATIENT_CLINIC_OR_DEPARTMENT_OTHER): Payer: PRIVATE HEALTH INSURANCE | Admitting: Oncology

## 2015-09-05 VITALS — BP 120/72 | HR 68 | Temp 97.8°F | Resp 18 | Ht 69.0 in | Wt 260.9 lb

## 2015-09-05 DIAGNOSIS — C8468 Anaplastic large cell lymphoma, ALK-positive, lymph nodes of multiple sites: Secondary | ICD-10-CM

## 2015-09-05 DIAGNOSIS — C8590 Non-Hodgkin lymphoma, unspecified, unspecified site: Secondary | ICD-10-CM | POA: Diagnosis not present

## 2015-09-05 DIAGNOSIS — Z95828 Presence of other vascular implants and grafts: Secondary | ICD-10-CM

## 2015-09-05 LAB — COMPREHENSIVE METABOLIC PANEL (CC13)
ALT: 28 U/L (ref 0–55)
AST: 23 U/L (ref 5–34)
Albumin: 3.9 g/dL (ref 3.5–5.0)
Alkaline Phosphatase: 57 U/L (ref 40–150)
Anion Gap: 7 mEq/L (ref 3–11)
BUN: 17 mg/dL (ref 7.0–26.0)
CO2: 27 mEq/L (ref 22–29)
Calcium: 9.6 mg/dL (ref 8.4–10.4)
Chloride: 112 mEq/L — ABNORMAL HIGH (ref 98–109)
Creatinine: 0.9 mg/dL (ref 0.6–1.1)
EGFR: >90 mL/min/{1.73_m2} (ref >90)
Glucose: 113 mg/dl (ref 70–140)
Potassium: 3.8 mEq/L (ref 3.5–5.1)
Sodium: 146 mEq/L — ABNORMAL HIGH (ref 136–145)
Total Bilirubin: 0.37 mg/dL (ref 0.20–1.20)
Total Protein: 6.9 g/dL (ref 6.4–8.3)

## 2015-09-05 LAB — CBC WITH DIFFERENTIAL/PLATELET
BASO%: 0.3 % (ref 0.0–2.0)
Basophils Absolute: 0 10*3/uL (ref 0.0–0.1)
EOS%: 1.7 % (ref 0.0–7.0)
Eosinophils Absolute: 0.1 10*3/uL (ref 0.0–0.5)
HCT: 36.7 % (ref 34.8–46.6)
HGB: 11.9 g/dL (ref 11.6–15.9)
LYMPH%: 42.2 % (ref 14.0–49.7)
MCH: 27.4 pg (ref 25.1–34.0)
MCHC: 32.4 g/dL (ref 31.5–36.0)
MCV: 84.6 fL (ref 79.5–101.0)
MONO#: 0.6 10*3/uL (ref 0.1–0.9)
MONO%: 7.6 % (ref 0.0–14.0)
NEUT#: 3.5 10*3/uL (ref 1.5–6.5)
NEUT%: 48.2 % (ref 38.4–76.8)
Platelets: 166 10*3/uL (ref 145–400)
RBC: 4.34 10*6/uL (ref 3.70–5.45)
RDW: 14.3 % (ref 11.2–14.5)
WBC: 7.2 10*3/uL (ref 3.9–10.3)
lymph#: 3.1 10*3/uL (ref 0.9–3.3)

## 2015-09-05 MED ORDER — SODIUM CHLORIDE 0.9 % IJ SOLN
10.0000 mL | INTRAMUSCULAR | Status: DC | PRN
  Administered 2015-09-05: 10 mL via INTRAVENOUS
  Filled 2015-09-05: qty 10

## 2015-09-05 MED ORDER — HEPARIN SOD (PORK) LOCK FLUSH 100 UNIT/ML IV SOLN
500.0000 [IU] | Freq: Once | INTRAVENOUS | Status: AC
  Administered 2015-09-05: 500 [IU] via INTRAVENOUS
  Filled 2015-09-05: qty 5

## 2015-09-05 NOTE — Patient Instructions (Signed)
Implanted Port Home Guide  An implanted port is a type of central line that is placed under the skin. Central lines are used to provide IV access when treatment or nutrition needs to be given through a person's veins. Implanted ports are used for long-term IV access. An implanted port may be placed because:    You need IV medicine that would be irritating to the small veins in your hands or arms.    You need long-term IV medicines, such as antibiotics.    You need IV nutrition for a long period.    You need frequent blood draws for lab tests.    You need dialysis.   Implanted ports are usually placed in the chest area, but they can also be placed in the upper arm, the abdomen, or the leg. An implanted port has two main parts:    Reservoir. The reservoir is round and will appear as a small, raised area under your skin. The reservoir is the part where a needle is inserted to give medicines or draw blood.    Catheter. The catheter is a thin, flexible tube that extends from the reservoir. The catheter is placed into a large vein. Medicine that is inserted into the reservoir goes into the catheter and then into the vein.   HOW WILL I CARE FOR MY INCISION SITE?  Do not get the incision site wet. Bathe or shower as directed by your health care Amanee Iacovelli.   HOW IS MY PORT ACCESSED?  Special steps must be taken to access the port:    Before the port is accessed, a numbing cream can be placed on the skin. This helps numb the skin over the port site.    Your health care Desman Polak uses a sterile technique to access the port.   Your health care Daliah Chaudoin must put on a mask and sterile gloves.   The skin over your port is cleaned carefully with an antiseptic and allowed to dry.   The port is gently pinched between sterile gloves, and a needle is inserted into the port.   Only "non-coring" port needles should be used to access the port. Once the port is accessed, a blood return should be checked. This helps  ensure that the port is in the vein and is not clogged.    If your port needs to remain accessed for a constant infusion, a clear (transparent) bandage will be placed over the needle site. The bandage and needle will need to be changed every week, or as directed by your health care Hermena Swint.    Keep the bandage covering the needle clean and dry. Do not get it wet. Follow your health care Harnoor Kohles's instructions on how to take a shower or bath while the port is accessed.    If your port does not need to stay accessed, no bandage is needed over the port.   WHAT IS FLUSHING?  Flushing helps keep the port from getting clogged. Follow your health care Brandelyn Henne's instructions on how and when to flush the port. Ports are usually flushed with saline solution or a medicine called heparin. The need for flushing will depend on how the port is used.    If the port is used for intermittent medicines or blood draws, the port will need to be flushed:    After medicines have been given.    After blood has been drawn.    As part of routine maintenance.    If a constant infusion is   running, the port may not need to be flushed.   HOW LONG WILL MY PORT STAY IMPLANTED?  The port can stay in for as long as your health care Nykeem Citro thinks it is needed. When it is time for the port to come out, surgery will be done to remove it. The procedure is similar to the one performed when the port was put in.   WHEN SHOULD I SEEK IMMEDIATE MEDICAL CARE?  When you have an implanted port, you should seek immediate medical care if:    You notice a bad smell coming from the incision site.    You have swelling, redness, or drainage at the incision site.    You have more swelling or pain at the port site or the surrounding area.    You have a fever that is not controlled with medicine.  Document Released: 12/01/2005 Document Revised: 09/21/2013 Document Reviewed: 08/08/2013  [employer] Patient Information 2015 [employer]. This  information is not intended to replace advice given to you by your health care Said Rueb. Make sure you discuss any questions you have with your health care Cattleya Dobratz.

## 2015-09-05 NOTE — Progress Notes (Signed)
[facility] [place] OFFICE PROGRESS NOTE 09/05/2015   [facility], MD [address] [place] [address]  DIAGNOSIS: 54 year old woman diagnosed with anaplastic large cell lymphoma CD30 and ALK positive, CD20 negative in 2014.   PRIOR THERAPY:  CHOEP q 21 days started on 05/20/2013.  Completed 6 of 6 planned cycles for CHOEP on 09/05/2013.  Cycle #5 was complicated by ICU-level care secondary to severe hyperglycemia (see prior note for details).  Cycle #6, prednisone was excluded.  She received neulasta throughout her therapy.   She underwent autologous SCT on February 4th 2015 at [facility] ([address] [place]).  She continues to be in remission since that time.  CURRENT THERAPY:  Active surveillance.  INTERVAL HISTORY: Megan Mcknight presents today for a follow-up visit. Since the last visit, she reports no complaints. She has not reported any lymphadenopathy or constitutional symptoms.  She  continues to perform work-related duties without any decline. Her appetite had been excellent and denied any weight loss. She continues to follow-up on [facility] in her last visit was in August 2016. She received vaccination at that time and she is up-to-date.  She does not report any headaches, blurry vision, syncope or seizures. She denies any nausea/vomiting/fever or chills. He denies any chest pain, palpitations, worsening fatigue.  She has some signs of her hair regrowth.  She denies any pain. She denied cough. She denied a mucositis or night sweats. She does not report any cough, wheezing or hemoptysis. She does not report any constipation, diarrhea or hematochezia. She does not report any frequency urgency or hesitancy. The remaining review of systems unremarkable.  MEDICAL HISTORY: This was reviewed and unchanged at this time. Past Medical History  Diagnosis Date  . Hyperlipidemia   . Nerve pain     Left leg  . Seasonal allergies   . Acute bronchitis    . Colon polyps 02/2013    Per colonoscopy  . Diverticulosis 02/2013    Per colonoscopy  . Former light tobacco smoker   . Dysrhythmia   . Cancer     lung and kidney  . Anaplastic large cell lymphoma   . Asthma   . GERD (gastroesophageal reflux disease)   . Diabetes mellitus without complication     ALLERGIES:  is allergic to shellfish-derived products.  MEDICATIONS: has a current medication list which includes the following prescription(s): amlodipine, insulin glargine, lansoprazole, metformin, metoprolol tartrate, montelukast, and rosuvastatin.    PHYSICAL EXAMINATION: ECOG PERFORMANCE STATUS: 0 - Asymptomatic  Blood pressure 120/72, pulse 68, temperature 97.8 F (36.6 C), temperature source Oral, resp. rate 18, height _0  (1.753 m), weight 260 lb 14.4 oz (118.343 kg).  GENERAL:alert, awake woman without distress. SKIN: No rashes or lesions. R port a cath in place.   EYES: normal, Conjunctiva are pink and non-injected, sclera clear nonicteric. OROPHARYNX:no exudate, no erythema. No oral ulcers or lesions. No thrush. NECK: supple, thyroid normal size, non-tender.  LYMPH:  no palpable lymphadenopathy in the cervical, axillary or supraclavicular; no inguinal adenopathy noted. LUNGS: clear to auscultation and percussion with normal breathing effort no dullness to percussion. HEART: regular rate & rhythm and no murmurs and no lower extremity edema ABDOMEN:abdomen soft, non-tender and normal bowel sounds no shifting dullness. Musculoskeletal:no cyanosis of digits and no clubbing;  NEURO: No deficits. Motor, sensory are deep tendon reflexes.   Labs:  Lab Results  Component Value Date   WBC 5.5 06/14/2015   HGB 12.2 06/14/2015   HCT  37.6 06/14/2015   MCV 83.3 06/14/2015   PLT 150 06/14/2015   NEUTROABS 2.4 06/14/2015      Chemistry      Component Value Date/Time   NA 143 06/14/2015 0823   NA 142 07/26/2014 1440   K 4.0 06/14/2015 0823   K 4.4 07/26/2014 1440   CL  106 07/26/2014 1440   CO2 24 06/14/2015 0823   CO2 23 07/26/2014 1440   BUN 11.7 06/14/2015 0823   BUN 17 07/26/2014 1440   CREATININE 0.8 06/14/2015 0823   CREATININE 0.88 07/26/2014 1440      Component Value Date/Time   CALCIUM 9.2 06/14/2015 0823   CALCIUM 9.9 07/26/2014 1440   ALKPHOS 52 06/14/2015 0823   ALKPHOS 62 07/26/2014 1440   AST 18 06/14/2015 0823   AST 27 07/26/2014 1440   ALT 20 06/14/2015 0823   ALT 28 07/26/2014 1440   BILITOT 0.56 06/14/2015 0823   BILITOT 0.4 07/26/2014 1440       ASSESSMENT AND PLAN:  54 year old woman with the following issues:    1. 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  She received chemotherapy initially and subsequently  underwent high dose BEAM autologous SCT on 01/18/2014 (day #0) and have recovered well. She is getting active surveillance CT scans at [facility]. Her last CT scan on 01/24/2015 showed no evidence of any lymphadenopathy.   Her physical examination and laboratory testing does not reveal any evidence of recurrent disease. The plan is to continue with active surveillance and clinical visit every 6 months.  2. 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 Patient is following with opthalmology for further evaluation and management. No major changes at this time.  3. Port-A-Cath management:  This will be flushed every 6 weeks. No complications noted.  4. Vaccinations:  She is up-to-date at this time.  4. Follow-up:  Will be in 6 months.    [facility] MD 09/05/2015

## 2015-09-05 NOTE — Telephone Encounter (Signed)
Error

## 2015-09-05 NOTE — Telephone Encounter (Signed)
Gave adn printed appt sched and avs for pt for Sept thru March 2017

## 2015-10-17 ENCOUNTER — Ambulatory Visit (HOSPITAL_BASED_OUTPATIENT_CLINIC_OR_DEPARTMENT_OTHER): Payer: BLUE CROSS/BLUE SHIELD

## 2015-10-17 VITALS — BP 118/75 | HR 66 | Temp 98.3°F | Resp 14

## 2015-10-17 DIAGNOSIS — C8468 Anaplastic large cell lymphoma, ALK-positive, lymph nodes of multiple sites: Secondary | ICD-10-CM

## 2015-10-17 DIAGNOSIS — Z452 Encounter for adjustment and management of vascular access device: Secondary | ICD-10-CM

## 2015-10-17 DIAGNOSIS — Z95828 Presence of other vascular implants and grafts: Secondary | ICD-10-CM

## 2015-10-17 MED ORDER — HEPARIN SOD (PORK) LOCK FLUSH 100 UNIT/ML IV SOLN
500.0000 [IU] | Freq: Once | INTRAVENOUS | Status: AC
  Administered 2015-10-17: 500 [IU] via INTRAVENOUS
  Filled 2015-10-17: qty 5

## 2015-10-17 MED ORDER — SODIUM CHLORIDE 0.9 % IJ SOLN
10.0000 mL | INTRAMUSCULAR | Status: DC | PRN
  Administered 2015-10-17: 10 mL via INTRAVENOUS
  Filled 2015-10-17: qty 10

## 2015-10-17 NOTE — Patient Instructions (Signed)

## 2015-10-19 ENCOUNTER — Other Ambulatory Visit (HOSPITAL_COMMUNITY): Payer: Self-pay | Admitting: Internal Medicine

## 2015-10-19 DIAGNOSIS — Z1231 Encounter for screening mammogram for malignant neoplasm of breast: Secondary | ICD-10-CM

## 2015-10-31 ENCOUNTER — Ambulatory Visit (HOSPITAL_COMMUNITY)
Admission: RE | Admit: 2015-10-31 | Discharge: 2015-10-31 | Disposition: A | Discharge status: Home / Self Care | Payer: BLUE CROSS/BLUE SHIELD | Source: Ambulatory Visit | Attending: Internal Medicine | Admitting: Internal Medicine

## 2015-10-31 DIAGNOSIS — Z1231 Encounter for screening mammogram for malignant neoplasm of breast: Secondary | ICD-10-CM | POA: Diagnosis present

## 2015-11-28 ENCOUNTER — Ambulatory Visit (HOSPITAL_BASED_OUTPATIENT_CLINIC_OR_DEPARTMENT_OTHER): Payer: BLUE CROSS/BLUE SHIELD

## 2015-11-28 VITALS — BP 122/70 | HR 65 | Temp 97.8°F | Resp 16

## 2015-11-28 DIAGNOSIS — C8590 Non-Hodgkin lymphoma, unspecified, unspecified site: Secondary | ICD-10-CM | POA: Diagnosis not present

## 2015-11-28 DIAGNOSIS — Z95828 Presence of other vascular implants and grafts: Secondary | ICD-10-CM

## 2015-11-28 DIAGNOSIS — Z452 Encounter for adjustment and management of vascular access device: Secondary | ICD-10-CM | POA: Diagnosis not present

## 2015-11-28 MED ORDER — SODIUM CHLORIDE 0.9 % IJ SOLN
10.0000 mL | INTRAMUSCULAR | Status: DC | PRN
  Administered 2015-11-28: 10 mL via INTRAVENOUS
  Filled 2015-11-28: qty 10

## 2015-11-28 MED ORDER — HEPARIN SOD (PORK) LOCK FLUSH 100 UNIT/ML IV SOLN
500.0000 [IU] | Freq: Once | INTRAVENOUS | Status: AC
  Administered 2015-11-28: 500 [IU] via INTRAVENOUS
  Filled 2015-11-28: qty 5

## 2016-01-09 ENCOUNTER — Ambulatory Visit (HOSPITAL_BASED_OUTPATIENT_CLINIC_OR_DEPARTMENT_OTHER): Payer: BLUE CROSS/BLUE SHIELD

## 2016-01-09 VITALS — BP 123/67 | HR 101 | Temp 97.2°F | Resp 18

## 2016-01-09 DIAGNOSIS — Z452 Encounter for adjustment and management of vascular access device: Secondary | ICD-10-CM | POA: Diagnosis not present

## 2016-01-09 DIAGNOSIS — C8460 Anaplastic large cell lymphoma, ALK-positive, unspecified site: Secondary | ICD-10-CM

## 2016-01-09 DIAGNOSIS — Z95828 Presence of other vascular implants and grafts: Secondary | ICD-10-CM

## 2016-01-09 MED ORDER — HEPARIN SOD (PORK) LOCK FLUSH 100 UNIT/ML IV SOLN
500.0000 [IU] | Freq: Once | INTRAVENOUS | Status: AC
  Administered 2016-01-09: 500 [IU] via INTRAVENOUS
  Filled 2016-01-09: qty 5

## 2016-01-09 MED ORDER — SODIUM CHLORIDE 0.9% FLUSH
10.0000 mL | INTRAVENOUS | Status: DC | PRN
  Administered 2016-01-09: 10 mL via INTRAVENOUS
  Filled 2016-01-09: qty 10

## 2016-01-09 NOTE — Patient Instructions (Signed)

## 2016-01-30 DIAGNOSIS — C8460 Anaplastic large cell lymphoma, ALK-positive, unspecified site: Secondary | ICD-10-CM | POA: Diagnosis not present

## 2016-01-30 DIAGNOSIS — Z9484 Stem cells transplant status: Secondary | ICD-10-CM | POA: Diagnosis not present

## 2016-01-30 DIAGNOSIS — Z888 Allergy status to other drugs, medicaments and biological substances status: Secondary | ICD-10-CM | POA: Diagnosis not present

## 2016-01-30 DIAGNOSIS — Z08 Encounter for follow-up examination after completed treatment for malignant neoplasm: Secondary | ICD-10-CM | POA: Diagnosis not present

## 2016-01-30 DIAGNOSIS — E1142 Type 2 diabetes mellitus with diabetic polyneuropathy: Secondary | ICD-10-CM | POA: Diagnosis not present

## 2016-01-30 DIAGNOSIS — I10 Essential (primary) hypertension: Secondary | ICD-10-CM | POA: Diagnosis not present

## 2016-01-30 DIAGNOSIS — Z79899 Other long term (current) drug therapy: Secondary | ICD-10-CM | POA: Diagnosis not present

## 2016-01-30 DIAGNOSIS — Z794 Long term (current) use of insulin: Secondary | ICD-10-CM | POA: Diagnosis not present

## 2016-01-30 DIAGNOSIS — I4891 Unspecified atrial fibrillation: Secondary | ICD-10-CM | POA: Diagnosis not present

## 2016-01-30 DIAGNOSIS — G620 Drug-induced polyneuropathy: Secondary | ICD-10-CM | POA: Diagnosis not present

## 2016-01-30 DIAGNOSIS — Z8572 Personal history of non-Hodgkin lymphomas: Secondary | ICD-10-CM | POA: Diagnosis not present

## 2016-03-05 ENCOUNTER — Telehealth: Payer: Self-pay | Admitting: Oncology

## 2016-03-05 ENCOUNTER — Ambulatory Visit (HOSPITAL_BASED_OUTPATIENT_CLINIC_OR_DEPARTMENT_OTHER): Payer: BLUE CROSS/BLUE SHIELD | Admitting: Oncology

## 2016-03-05 ENCOUNTER — Ambulatory Visit (HOSPITAL_BASED_OUTPATIENT_CLINIC_OR_DEPARTMENT_OTHER): Payer: BLUE CROSS/BLUE SHIELD

## 2016-03-05 ENCOUNTER — Other Ambulatory Visit (HOSPITAL_BASED_OUTPATIENT_CLINIC_OR_DEPARTMENT_OTHER): Payer: BLUE CROSS/BLUE SHIELD

## 2016-03-05 VITALS — BP 135/68 | HR 73 | Temp 98.0°F | Resp 20 | Ht 69.0 in | Wt 259.5 lb

## 2016-03-05 DIAGNOSIS — C8460 Anaplastic large cell lymphoma, ALK-positive, unspecified site: Secondary | ICD-10-CM

## 2016-03-05 DIAGNOSIS — Z95828 Presence of other vascular implants and grafts: Secondary | ICD-10-CM

## 2016-03-05 DIAGNOSIS — E119 Type 2 diabetes mellitus without complications: Secondary | ICD-10-CM

## 2016-03-05 DIAGNOSIS — C8590 Non-Hodgkin lymphoma, unspecified, unspecified site: Secondary | ICD-10-CM

## 2016-03-05 LAB — COMPREHENSIVE METABOLIC PANEL
ALT: 22 U/L (ref 0–55)
AST: 18 U/L (ref 5–34)
Albumin: 3.8 g/dL (ref 3.5–5.0)
Alkaline Phosphatase: 46 U/L (ref 40–150)
Anion Gap: 8 mEq/L (ref 3–11)
BUN: 12.8 mg/dL (ref 7.0–26.0)
CO2: 26 mEq/L (ref 22–29)
Calcium: 9.1 mg/dL (ref 8.4–10.4)
Chloride: 110 mEq/L — ABNORMAL HIGH (ref 98–109)
Creatinine: 0.9 mg/dL (ref 0.6–1.1)
EGFR: 81 mL/min/{1.73_m2} — ABNORMAL LOW (ref >90)
Glucose: 115 mg/dl (ref 70–140)
Potassium: 3.9 mEq/L (ref 3.5–5.1)
Sodium: 144 mEq/L (ref 136–145)
Total Bilirubin: 0.43 mg/dL (ref 0.20–1.20)
Total Protein: 6.8 g/dL (ref 6.4–8.3)

## 2016-03-05 LAB — CBC WITH DIFFERENTIAL/PLATELET
BASO%: 0.2 % (ref 0.0–2.0)
Basophils Absolute: 0 10*3/uL (ref 0.0–0.1)
EOS%: 2.1 % (ref 0.0–7.0)
Eosinophils Absolute: 0.1 10*3/uL (ref 0.0–0.5)
HCT: 36.1 % (ref 34.8–46.6)
HGB: 11.5 g/dL — ABNORMAL LOW (ref 11.6–15.9)
LYMPH%: 49.8 % — ABNORMAL HIGH (ref 14.0–49.7)
MCH: 26.9 pg (ref 25.1–34.0)
MCHC: 31.9 g/dL (ref 31.5–36.0)
MCV: 84.5 fL (ref 79.5–101.0)
MONO#: 0.4 10*3/uL (ref 0.1–0.9)
MONO%: 6.4 % (ref 0.0–14.0)
NEUT#: 2.6 10*3/uL (ref 1.5–6.5)
NEUT%: 41.5 % (ref 38.4–76.8)
Platelets: 165 10*3/uL (ref 145–400)
RBC: 4.27 10*6/uL (ref 3.70–5.45)
RDW: 14.5 % (ref 11.2–14.5)
WBC: 6.2 10*3/uL (ref 3.9–10.3)
lymph#: 3.1 10*3/uL (ref 0.9–3.3)

## 2016-03-05 MED ORDER — SODIUM CHLORIDE 0.9% FLUSH
10.0000 mL | INTRAVENOUS | Status: DC | PRN
  Administered 2016-03-05: 10 mL via INTRAVENOUS
  Filled 2016-03-05: qty 10

## 2016-03-05 MED ORDER — HEPARIN SOD (PORK) LOCK FLUSH 100 UNIT/ML IV SOLN
500.0000 [IU] | Freq: Once | INTRAVENOUS | Status: AC
  Administered 2016-03-05: 500 [IU] via INTRAVENOUS
  Filled 2016-03-05: qty 5

## 2016-03-05 NOTE — Patient Instructions (Signed)

## 2016-03-05 NOTE — Progress Notes (Signed)
[place] OFFICE PROGRESS NOTE 03/05/2016   [facility], MD [address] [place] [address]  DIAGNOSIS: 55 year old woman diagnosed with anaplastic large cell lymphoma CD30 and ALK positive, CD20 negative in 2014.   PRIOR THERAPY:  CHOEP q 21 days started on 05/20/2013.  Completed 6 of 6 planned cycles for CHOEP on 09/05/2013.  Cycle #5 was complicated by ICU-level care secondary to severe hyperglycemia (see prior note for details).  Cycle #6, prednisone was excluded.  She received neulasta throughout her therapy.   She underwent autologous SCT on February 4th 2015 at [facility] ([address] [place]).  She continues to be in remission since that time.  CURRENT THERAPY:  Active surveillance.  INTERVAL HISTORY: Megan Mcknight presents today for a follow-up visit. Since the last visit, she continues to do very well without any recent complaints. She has not reported any lymphadenopathy or constitutional symptoms.  She  continues to perform work-related duties without any decline. Her appetite had been excellent and denied any weight loss. She does not report any back pain or shoulder pain. Does not report any complications related to her Port-A-Cath.  She follows up at [facility] and continues to be in remission.  She does not report any headaches, blurry vision, syncope or seizures. She denies any nausea/vomiting/fever or chills. He denies any chest pain, palpitations, worsening fatigue.  She has some signs of her hair regrowth.  She denies any pain. She denied cough. She denied a mucositis or night sweats. She does not report any cough, wheezing or hemoptysis. She does not report any constipation, diarrhea or hematochezia. She does not report any frequency urgency or hesitancy. The remaining review of systems unremarkable.  MEDICAL HISTORY: This was reviewed and unchanged at this time. Past Medical History  Diagnosis Date  . Hyperlipidemia    . Nerve pain     Left leg  . Seasonal allergies   . Acute bronchitis   . Colon polyps 02/2013    Per colonoscopy  . Diverticulosis 02/2013    Per colonoscopy  . Former light tobacco smoker   . Dysrhythmia   . Cancer     lung and kidney  . Anaplastic large cell lymphoma   . Asthma   . GERD (gastroesophageal reflux disease)   . Diabetes mellitus without complication     ALLERGIES:  is allergic to shellfish-derived products.  MEDICATIONS: has a current medication list which includes the following prescription(s): amlodipine, insulin glargine, lansoprazole, losartan, metformin, metoprolol tartrate, montelukast, and rosuvastatin.    PHYSICAL EXAMINATION: ECOG PERFORMANCE STATUS: 0 - Asymptomatic  Blood pressure 135/68, pulse 73, temperature 98 F (36.7 C), temperature source Oral, resp. rate 20, height 5' 9"  (1.753 m), weight 259 lb 8 oz (117.708 kg), SpO2 100 %.  GENERAL:alert, awake. Without distress. SKIN: No rashes or lesions. EYES: normal, Conjunctiva are pink and non-injected, sclera clear nonicteric. OROPHARYNX:. No thrush, ulcers or lesions. NECK: supple, thyroid normal size, non-tender.  LYMPH:  no palpable lymphadenopathy in the cervical, axillary or supraclavicular; no inguinal adenopathy noted. LUNGS: clear to auscultation and percussion with normal breathing effort no dullness to percussion. HEART: regular rate & rhythm and no murmurs and no lower extremity edema ABDOMEN:abdomen soft, non-tender and normal bowel sounds no rebound or guarding. Musculoskeletal:no cyanosis of digits and no clubbing;  NEURO: No deficits. Motor, sensory are deep tendon reflexes.   Labs:  Lab Results  Component Value Date   WBC 6.2 03/05/2016   HGB 11.5*  03/05/2016   HCT 36.1 03/05/2016   MCV 84.5 03/05/2016   PLT 165 03/05/2016   NEUTROABS 2.6 03/05/2016      Chemistry      Component Value Date/Time   NA 146* 09/05/2015 1033   NA 142 07/26/2014 1440   K 3.8 09/05/2015  1033   K 4.4 07/26/2014 1440   CL 106 07/26/2014 1440   CO2 27 09/05/2015 1033   CO2 23 07/26/2014 1440   BUN 17.0 09/05/2015 1033   BUN 17 07/26/2014 1440   CREATININE 0.9 09/05/2015 1033   CREATININE 0.88 07/26/2014 1440      Component Value Date/Time   CALCIUM 9.6 09/05/2015 1033   CALCIUM 9.9 07/26/2014 1440   ALKPHOS 57 09/05/2015 1033   ALKPHOS 62 07/26/2014 1440   AST 23 09/05/2015 1033   AST 27 07/26/2014 1440   ALT 28 09/05/2015 1033   ALT 28 07/26/2014 1440   BILITOT 0.37 09/05/2015 1033   BILITOT 0.4 07/26/2014 1440       ASSESSMENT AND PLAN:    55 year old woman with the following issues:    1. 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  She received chemotherapy initially and subsequently  underwent high dose BEAM autologous SCT on 01/18/2014 (day #0) and have recovered well. She is getting active surveillance CT scans at [facility].   Her physical examination and laboratory testing does not reveal any evidence of recurrent disease and continues to be in remission. The plan is to continue with active surveillance and clinical visit every 6 months.  2. 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 No major changes at this time.  3. Port-A-Cath management: Risks and benefits of keeping her Port-A-Cath versus removal were discussed today she is agreeable to proceed with removal. We will refer her to interventional radiology who placed a Port-A-Cath in June 2014.  4. Vaccinations: She is up-to-date at this time.  5. Follow-up: Will be in 6 months.    [facility] MD 03/05/2016

## 2016-03-05 NOTE — Telephone Encounter (Signed)
Gave and printed appt sched and avs for pt for Sept °

## 2016-03-07 ENCOUNTER — Other Ambulatory Visit: Payer: Self-pay | Admitting: Radiology

## 2016-03-07 ENCOUNTER — Other Ambulatory Visit: Payer: Self-pay | Admitting: General Surgery

## 2016-03-10 ENCOUNTER — Ambulatory Visit (HOSPITAL_COMMUNITY)
Admission: RE | Admit: 2016-03-10 | Discharge: 2016-03-10 | Disposition: A | Discharge status: Home / Self Care | Payer: BLUE CROSS/BLUE SHIELD | Source: Ambulatory Visit | Attending: Oncology | Admitting: Oncology

## 2016-03-10 ENCOUNTER — Encounter (HOSPITAL_COMMUNITY): Payer: Self-pay

## 2016-03-10 DIAGNOSIS — Z8572 Personal history of non-Hodgkin lymphomas: Secondary | ICD-10-CM | POA: Insufficient documentation

## 2016-03-10 DIAGNOSIS — Z452 Encounter for adjustment and management of vascular access device: Secondary | ICD-10-CM | POA: Diagnosis not present

## 2016-03-10 DIAGNOSIS — C8460 Anaplastic large cell lymphoma, ALK-positive, unspecified site: Secondary | ICD-10-CM

## 2016-03-10 HISTORY — DX: Stem cells transplant status: Z94.84

## 2016-03-10 LAB — PROTIME-INR
INR: 1.07 (ref 0.00–1.49)
Prothrombin Time: 13.7 seconds (ref 11.6–15.2)

## 2016-03-10 LAB — GLUCOSE, CAPILLARY: Glucose-Capillary: 101 mg/dL — ABNORMAL HIGH (ref 65–99)

## 2016-03-10 LAB — CBC
HCT: 35.9 % — ABNORMAL LOW (ref 36.0–46.0)
Hemoglobin: 11.7 g/dL — ABNORMAL LOW (ref 12.0–15.0)
MCH: 27.4 pg (ref 26.0–34.0)
MCHC: 32.6 g/dL (ref 30.0–36.0)
MCV: 84.1 fL (ref 78.0–100.0)
Platelets: 184 10*3/uL (ref 150–400)
RBC: 4.27 MIL/uL (ref 3.87–5.11)
RDW: 14.6 % (ref 11.5–15.5)
WBC: 6.8 10*3/uL (ref 4.0–10.5)

## 2016-03-10 LAB — APTT: aPTT: 26 seconds (ref 24–37)

## 2016-03-10 MED ORDER — LIDOCAINE HCL 1 % IJ SOLN
INTRAMUSCULAR | Status: AC
  Filled 2016-03-10: qty 20

## 2016-03-10 MED ORDER — SODIUM CHLORIDE 0.9 % IV SOLN
INTRAVENOUS | Status: DC
  Administered 2016-03-10: 13:00:00 via INTRAVENOUS

## 2016-03-10 MED ORDER — FENTANYL CITRATE (PF) 100 MCG/2ML IJ SOLN
INTRAMUSCULAR | Status: AC | PRN
  Administered 2016-03-10: 50 ug via INTRAVENOUS

## 2016-03-10 MED ORDER — MIDAZOLAM HCL 2 MG/2ML IJ SOLN
INTRAMUSCULAR | Status: AC | PRN
  Administered 2016-03-10 (×2): 1 mg via INTRAVENOUS

## 2016-03-10 MED ORDER — FENTANYL CITRATE (PF) 100 MCG/2ML IJ SOLN
INTRAMUSCULAR | Status: AC
  Filled 2016-03-10: qty 4

## 2016-03-10 MED ORDER — MIDAZOLAM HCL 2 MG/2ML IJ SOLN
INTRAMUSCULAR | Status: AC
  Filled 2016-03-10: qty 6

## 2016-03-10 MED ORDER — CEFAZOLIN SODIUM-DEXTROSE 2-4 GM/100ML-% IV SOLN
2.0000 g | Freq: Once | INTRAVENOUS | Status: AC
  Administered 2016-03-10: 2 g via INTRAVENOUS
  Filled 2016-03-10: qty 100

## 2016-03-10 NOTE — Sedation Documentation (Signed)
Patient denies pain and is resting comfortably.  

## 2016-03-10 NOTE — Procedures (Signed)
RIJV PAC removal No comp

## 2016-03-10 NOTE — H&P (Signed)
Referring Physician(s): [place] Physician: Marybelle Killings  Chief Complaint:  "I'm getting my port out"  Subjective: Patient familiar to IR service from prior Port-A-Cath placement as well as bone marrow biopsy in 2014. She has a history of anaplastic large cell lymphoma diagnosed in 2014 and has completed treatment. She currently has no evidence of residual disease and is in remission. Request now received for Port-A-Cath removal. She denies fever, headache, chest pain, dyspnea, cough, abdominal/back pain, nausea, vomiting or abnormal bleeding.   Allergies: Shellfish-derived products  Medications: Prior to Admission medications   Medication Sig Start Date End Date Taking? Authorizing Provider  amLODipine (NORVASC) 5 MG tablet Take 5 mg by mouth daily.   Yes Historical Provider, MD  losartan (COZAAR) 25 MG tablet Take 25 mg by mouth daily. As directed 02/13/16  Yes Historical Provider, MD  metoprolol tartrate (LOPRESSOR) 25 MG tablet Take 25 mg by mouth 3 (three) times daily.   Yes Historical Provider, MD  insulin glargine (LANTUS) 100 UNIT/ML injection Inject 40 Units into the skin as needed.    Historical Provider, MD  lansoprazole (PREVACID) 15 MG capsule Take 15 mg by mouth as needed.    Historical Provider, MD  metFORMIN (GLUCOPHAGE) 500 MG tablet Take by mouth 2 (two) times daily with a meal.    Historical Provider, MD  montelukast (SINGULAIR) 10 MG tablet Take 10 mg by mouth at bedtime.    Historical Provider, MD  rosuvastatin (CRESTOR) 10 MG tablet Take 10 mg by mouth daily.    Historical Provider, MD     Vital Signs: BP 126/87 mmHg  Pulse 74  Temp(Src) 98.2 F (36.8 C) (Oral)  Resp 20  SpO2 99%  Physical Exam patient awake, alert. Chest clear to auscultation bilaterally. Clean, intact right chest wall Port-A-Cath. Heart with regular rate and rhythm. Abdomen obese, soft, positive bowel sounds, nontender. Lower extremities with trace to 1+  edema  Imaging: No results found.  Labs:  CBC:  Recent Labs  06/14/15 0823 09/05/15 1033 03/05/16 1020 03/10/16 1215  WBC 5.5 7.2 6.2 6.8  HGB 12.2 11.9 11.5* 11.7*  HCT 37.6 36.7 36.1 35.9*  PLT 150 166 165 184    COAGS:  Recent Labs  03/10/16 1215  INR 1.07  APTT 26    BMP:  Recent Labs  06/14/15 0823 09/05/15 1033 03/05/16 1020  NA 143 146* 144  K 4.0 3.8 3.9  CO2 _0 GLUCOSE 110 113 115  BUN 11.7 17.0 12.8  CALCIUM 9.2 9.6 9.1  CREATININE 0.8 0.9 0.9    LIVER FUNCTION TESTS:  Recent Labs  06/14/15 0823 09/05/15 1033 03/05/16 1020  BILITOT 0.56 0.37 0.43  AST _1 ALT _2 ALKPHOS 52 57 46  PROT 6.6 6.9 6.8  ALBUMIN 3.8 3.9 3.8    Assessment and Plan: Patient with history of anaplastic large cell lymphoma diagnosed in 2014. Right chest wall Port-A-Cath placed at that time. She has completed treatment and is currently in remission. She presents today for Port-A-Cath removal. Details/risks of procedure, including but not limited to, internal bleeding, infection, discussed with patient and husband with their understanding and consent.    Electronically Signed: D. Rowe Robert 03/10/2016, 12:41 PM   I spent a total of 15 minutes at the the patient's bedside AND on the patient's hospital floor or unit, greater than 50% of which was counseling/coordinating care for port a cath removal

## 2016-03-10 NOTE — Discharge Instructions (Signed)
Remove bandage in 24 hours and shower. Call MD if redness, drainage, swelling or fever. Call for severe pain.    Incision Care An incision is when a surgeon cuts into your body. After surgery, the incision needs to be cared for properly to prevent infection.  HOW TO CARE FOR YOUR INCISION  Take medicines only as directed by your health care provider.  There are many different ways to close and cover an incision, including stitches, skin glue, and adhesive strips. Follow your health care provider's instructions on:  Incision care.  Bandage (dressing) changes and removal.  Incision closure removal.  Do not take baths, swim, or use a hot tub until your health care provider approves. You may shower as directed by your health care provider.  Resume your normal diet and activities as directed.  Use anti-itch medicine (such as an antihistamine) as directed by your health care provider. The incision may itch while it is healing. Do not pick or scratch at the incision.  Drink enough fluid to keep your urine clear or pale yellow. SEEK MEDICAL CARE IF:   You have drainage, redness, swelling, or pain at your incision site.  You have muscle aches, chills, or a general ill feeling.  You notice a bad smell coming from the incision or dressing.  Your incision edges separate after the sutures, staples, or skin adhesive strips have been removed.  You have persistent nausea or vomiting.  You have a fever.  You are dizzy. SEEK IMMEDIATE MEDICAL CARE IF:   You have a rash.  You faint.  You have difficulty breathing. MAKE SURE YOU:   Understand these instructions.  Will watch your condition.  Will get help right away if you are not doing well or get worse.   This information is not intended to replace advice given to you by your health care provider. Make sure you discuss any questions you have with your health care provider.   Document Released: 06/20/2005 Document Revised:  12/22/2014 Document Reviewed: 01/25/2014 Elsevier Interactive Patient Education 2016 Elsevier Inc.    Moderate Conscious Sedation, Adult Sedation is the use of medicines to promote relaxation and relieve discomfort and anxiety. Moderate conscious sedation is a type of sedation. Under moderate conscious sedation you are less alert than normal but are still able to respond to instructions or stimulation. Moderate conscious sedation is used during short medical and dental procedures. It is milder than deep sedation or general anesthesia and allows you to return to your regular activities sooner. LET [facility] CARE PROVIDER KNOW ABOUT:   Any allergies you have.  All medicines you are taking, including vitamins, herbs, eye drops, creams, and over-the-counter medicines.  Use of steroids (by mouth or creams).  Previous problems you or members of your family have had with the use of anesthetics.  Any blood disorders you have.  Previous surgeries you have had.  Medical conditions you have.  Possibility of pregnancy, if this applies.  Use of cigarettes, alcohol, or illegal drugs. RISKS AND COMPLICATIONS Generally, this is a safe procedure. However, as with any procedure, problems can occur. Possible problems include:  Oversedation.  Trouble breathing on your own. You may need to have a breathing tube until you are awake and breathing on your own.  Allergic reaction to any of the medicines used for the procedure. BEFORE THE PROCEDURE  You may have blood tests done. These tests can help show how well your kidneys and liver are working. They can also show how  well your blood clots.  A physical exam will be done.  Only take medicines as directed by your health care provider. You may need to stop taking medicines (such as blood thinners, aspirin, or nonsteroidal anti-inflammatory drugs) before the procedure.   Do not eat or drink at least 6 hours before the procedure or as directed  by your health care provider.  Arrange for a responsible adult, family member, or friend to take you home after the procedure. He or she should stay with you for at least 24 hours after the procedure, until the medicine has worn off. PROCEDURE   An intravenous (IV) catheter will be inserted into one of your veins. Medicine will be able to flow directly into your body through this catheter. You may be given medicine through this tube to help prevent pain and help you relax.  The medical or dental procedure will be done. AFTER THE PROCEDURE  You will stay in a recovery area until the medicine has worn off. Your blood pressure and pulse will be checked.   Depending on the procedure you had, you may be allowed to go home when you can tolerate liquids and your pain is under control.   This information is not intended to replace advice given to you by your health care provider. Make sure you discuss any questions you have with your health care provider.   Document Released: 08/26/2001 Document Revised: 12/22/2014 Document Reviewed: 08/08/2013 Elsevier Interactive Patient Education [employer].

## 2016-05-27 DIAGNOSIS — E119 Type 2 diabetes mellitus without complications: Secondary | ICD-10-CM | POA: Diagnosis not present

## 2016-05-27 DIAGNOSIS — H04123 Dry eye syndrome of bilateral lacrimal glands: Secondary | ICD-10-CM | POA: Diagnosis not present

## 2016-05-27 DIAGNOSIS — H40003 Preglaucoma, unspecified, bilateral: Secondary | ICD-10-CM | POA: Diagnosis not present

## 2016-06-23 DIAGNOSIS — H40003 Preglaucoma, unspecified, bilateral: Secondary | ICD-10-CM | POA: Diagnosis not present

## 2016-08-20 ENCOUNTER — Other Ambulatory Visit (HOSPITAL_BASED_OUTPATIENT_CLINIC_OR_DEPARTMENT_OTHER): Payer: BLUE CROSS/BLUE SHIELD

## 2016-08-20 ENCOUNTER — Telehealth: Payer: Self-pay | Admitting: Oncology

## 2016-08-20 ENCOUNTER — Ambulatory Visit (HOSPITAL_BASED_OUTPATIENT_CLINIC_OR_DEPARTMENT_OTHER): Payer: BLUE CROSS/BLUE SHIELD | Admitting: Oncology

## 2016-08-20 VITALS — BP 126/78 | HR 74 | Temp 98.0°F | Resp 18 | Wt 264.1 lb

## 2016-08-20 DIAGNOSIS — Z8572 Personal history of non-Hodgkin lymphomas: Secondary | ICD-10-CM

## 2016-08-20 DIAGNOSIS — E119 Type 2 diabetes mellitus without complications: Secondary | ICD-10-CM | POA: Diagnosis not present

## 2016-08-20 DIAGNOSIS — C8460 Anaplastic large cell lymphoma, ALK-positive, unspecified site: Secondary | ICD-10-CM

## 2016-08-20 LAB — CBC WITH DIFFERENTIAL/PLATELET
BASO%: 0.9 % (ref 0.0–2.0)
Basophils Absolute: 0.1 10*3/uL (ref 0.0–0.1)
EOS%: 1.8 % (ref 0.0–7.0)
Eosinophils Absolute: 0.1 10*3/uL (ref 0.0–0.5)
HCT: 37.8 % (ref 34.8–46.6)
HGB: 12.2 g/dL (ref 11.6–15.9)
LYMPH%: 52 % — ABNORMAL HIGH (ref 14.0–49.7)
MCH: 26.9 pg (ref 25.1–34.0)
MCHC: 32.2 g/dL (ref 31.5–36.0)
MCV: 83.6 fL (ref 79.5–101.0)
MONO#: 0.4 10*3/uL (ref 0.1–0.9)
MONO%: 5.6 % (ref 0.0–14.0)
NEUT#: 3 10*3/uL (ref 1.5–6.5)
NEUT%: 39.7 % (ref 38.4–76.8)
Platelets: 174 10*3/uL (ref 145–400)
RBC: 4.53 10*6/uL (ref 3.70–5.45)
RDW: 14.3 % (ref 11.2–14.5)
WBC: 7.7 10*3/uL (ref 3.9–10.3)
lymph#: 4 10*3/uL — ABNORMAL HIGH (ref 0.9–3.3)

## 2016-08-20 LAB — COMPREHENSIVE METABOLIC PANEL
ALT: 20 U/L (ref 0–55)
AST: 17 U/L (ref 5–34)
Albumin: 3.8 g/dL (ref 3.5–5.0)
Alkaline Phosphatase: 53 U/L (ref 40–150)
Anion Gap: 9 mEq/L (ref 3–11)
BUN: 18.1 mg/dL (ref 7.0–26.0)
CO2: 26 mEq/L (ref 22–29)
Calcium: 9.3 mg/dL (ref 8.4–10.4)
Chloride: 110 mEq/L — ABNORMAL HIGH (ref 98–109)
Creatinine: 1 mg/dL (ref 0.6–1.1)
EGFR: 73 mL/min/{1.73_m2} — ABNORMAL LOW (ref >90)
Glucose: 109 mg/dl (ref 70–140)
Potassium: 3.6 mEq/L (ref 3.5–5.1)
Sodium: 145 mEq/L (ref 136–145)
Total Bilirubin: 0.39 mg/dL (ref 0.20–1.20)
Total Protein: 7.3 g/dL (ref 6.4–8.3)

## 2016-08-20 NOTE — Progress Notes (Signed)
[place] OFFICE PROGRESS NOTE 08/20/16   [facility], MD [address] [place] [address]  DIAGNOSIS: 55 year old woman diagnosed with anaplastic large cell lymphoma CD30 and ALK positive, CD20 negative in 2014.   PRIOR THERAPY:  CHOEP q 21 days started on 05/20/2013.  Completed 6 of 6 planned cycles for CHOEP on 09/05/2013.  Cycle #5 was complicated by ICU-level care secondary to severe hyperglycemia (see prior note for details).  Cycle #6, prednisone was excluded.  She received neulasta throughout her therapy.   She underwent autologous SCT on February 4th 2015 at [facility] ([place]).  She continues to be in remission since that time.  CURRENT THERAPY:  Active surveillance.  INTERVAL HISTORY: Ms. Bowron presents today for a follow-up visit. Since the last visit, she reports no recent changes in her health. She remains active and performs activities of daily living. Her appetite is excellent and gained more weight since last visit.  She has not reported any lymphadenopathy or constitutional symptoms.  She denied any fevers or chills or dysphagia. She denied any excessive fatigue or tiredness. Her Port-A-Cath has been removed since the last visit.   She does not report any headaches, blurry vision, syncope or seizures. She denies any nausea/vomiting/fever or chills. He denies any chest pain, palpitations, worsening fatigue.  She has some signs of her hair regrowth.  She denies any pain. She denied cough. She denied a mucositis or night sweats. She does not report any cough, wheezing or hemoptysis. She does not report any constipation, diarrhea or hematochezia. She does not report any frequency urgency or hesitancy. The remaining review of systems unremarkable.  MEDICAL HISTORY: This was reviewed and unchanged at this time. Past Medical History:  Diagnosis Date  . Acute bronchitis   . Anaplastic large cell lymphoma   . Asthma   . Cancer ([place])     lung and kidney  . Colon polyps 02/2013   Per colonoscopy  . Diabetes mellitus without complication ([place])   . Diverticulosis 02/2013   Per colonoscopy  . Dysrhythmia   . Former light tobacco smoker   . GERD (gastroesophageal reflux disease)   . H/O stem cell transplant ([place]) 2015  . Hyperlipidemia   . Nerve pain    Left leg  . Seasonal allergies     ALLERGIES:  is allergic to shellfish-derived products.  MEDICATIONS: has a current medication list which includes the following prescription(s): amlodipine, insulin glargine, lansoprazole, losartan, metformin, metoprolol tartrate, montelukast, and rosuvastatin.    PHYSICAL EXAMINATION: ECOG PERFORMANCE STATUS: 0 - Asymptomatic  Her vitals were personally reviewed showed normal heart rate of 74. Her blood pressure is 126/78. She weighs 264 pounds.  GENERAL: Well-appearing woman without distress. SKIN: No rashes or lesions. No petechiae or ecchymosis. EYES: normal, sclera clear nonicteric. OROPHARYNX:. No thrush, ulcers or lesions.  NECK: supple, thyroid normal size, non-tender.  LYMPH:  no palpable lymphadenopathy in the cervical, axillary or supraclavicular; no inguinal adenopathy noted. LUNGS: clear to auscultation and percussion with normal breathing effort.  HEART: regular rate & rhythm and no murmurs and no lower extremity edema ABDOMEN:abdomen soft, non-tender and normal bowel sounds no shifting dullness or ascites. Musculoskeletal:no cyanosis of digits and no clubbing;  NEURO: No deficits. Motor, sensory are deep tendon reflexes.   Labs:  Lab Results  Component Value Date   WBC 7.7 08/20/2016   HGB 12.2 08/20/2016   HCT 37.8 08/20/2016   MCV 83.6 08/20/2016   PLT 174 08/20/2016  NEUTROABS 3.0 08/20/2016      Chemistry      Component Value Date/Time   NA 144 03/05/2016 1020   K 3.9 03/05/2016 1020   CL 106 07/26/2014 1440   CO2 26 03/05/2016 1020   BUN 12.8 03/05/2016 1020   CREATININE 0.9 03/05/2016 1020       Component Value Date/Time   CALCIUM 9.1 03/05/2016 1020   ALKPHOS 46 03/05/2016 1020   AST 18 03/05/2016 1020   ALT 22 03/05/2016 1020   BILITOT 0.43 03/05/2016 1020       ASSESSMENT AND PLAN:    55 year old woman with the following issues:    1. 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  She received chemotherapy initially and subsequently  underwent high dose BEAM autologous SCT on 01/18/2014 (day #0) and have recovered well. She is getting active surveillance CT scans at [facility].   She continues to be on active surveillance without any evidence of relapse disease. Her last CT scan in 2016 showed no evidence of recurrence obtain at [facility]. The plan is to continue with observation and surveillance with laboratory testing and physical examination every 6 months. She will continue to follow annually at the survivorship clinic at [facility].  2. 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 No major changes at this time.  3. Port-A-Cath management: This has been removed in March 2017.  4. Vaccinations: She is up-to-date at this time.  5. Follow-up: Will be in 6 months.    [facility] MD 08/20/16

## 2016-08-20 NOTE — Telephone Encounter (Signed)
AVS REPORT AND SCHD GIVEN PER 08/20/16 LOS.

## 2016-10-06 DIAGNOSIS — H40003 Preglaucoma, unspecified, bilateral: Secondary | ICD-10-CM | POA: Diagnosis not present

## 2016-10-09 ENCOUNTER — Other Ambulatory Visit (HOSPITAL_COMMUNITY): Payer: Self-pay | Admitting: Internal Medicine

## 2016-10-09 DIAGNOSIS — Z1231 Encounter for screening mammogram for malignant neoplasm of breast: Secondary | ICD-10-CM

## 2016-10-26 ENCOUNTER — Emergency Department (HOSPITAL_COMMUNITY)
Admission: EM | Admit: 2016-10-26 | Discharge: 2016-10-27 | Disposition: A | Discharge status: Home / Self Care | Payer: BLUE CROSS/BLUE SHIELD | Attending: Emergency Medicine | Admitting: Emergency Medicine

## 2016-10-26 ENCOUNTER — Encounter (HOSPITAL_COMMUNITY): Payer: Self-pay

## 2016-10-26 ENCOUNTER — Emergency Department (HOSPITAL_COMMUNITY): Payer: BLUE CROSS/BLUE SHIELD

## 2016-10-26 DIAGNOSIS — Z87891 Personal history of nicotine dependence: Secondary | ICD-10-CM | POA: Insufficient documentation

## 2016-10-26 DIAGNOSIS — E119 Type 2 diabetes mellitus without complications: Secondary | ICD-10-CM | POA: Insufficient documentation

## 2016-10-26 DIAGNOSIS — Z85118 Personal history of other malignant neoplasm of bronchus and lung: Secondary | ICD-10-CM | POA: Insufficient documentation

## 2016-10-26 DIAGNOSIS — Z794 Long term (current) use of insulin: Secondary | ICD-10-CM | POA: Insufficient documentation

## 2016-10-26 DIAGNOSIS — Z85528 Personal history of other malignant neoplasm of kidney: Secondary | ICD-10-CM | POA: Insufficient documentation

## 2016-10-26 DIAGNOSIS — R05 Cough: Secondary | ICD-10-CM | POA: Diagnosis not present

## 2016-10-26 DIAGNOSIS — Z7984 Long term (current) use of oral hypoglycemic drugs: Secondary | ICD-10-CM | POA: Diagnosis not present

## 2016-10-26 DIAGNOSIS — Z79899 Other long term (current) drug therapy: Secondary | ICD-10-CM | POA: Insufficient documentation

## 2016-10-26 DIAGNOSIS — J40 Bronchitis, not specified as acute or chronic: Secondary | ICD-10-CM | POA: Diagnosis not present

## 2016-10-26 DIAGNOSIS — J45901 Unspecified asthma with (acute) exacerbation: Secondary | ICD-10-CM | POA: Diagnosis not present

## 2016-10-26 DIAGNOSIS — J4521 Mild intermittent asthma with (acute) exacerbation: Secondary | ICD-10-CM | POA: Diagnosis not present

## 2016-10-26 DIAGNOSIS — R0602 Shortness of breath: Secondary | ICD-10-CM | POA: Diagnosis present

## 2016-10-26 MED ORDER — ALBUTEROL SULFATE (2.5 MG/3ML) 0.083% IN NEBU
2.5000 mg | INHALATION_SOLUTION | Freq: Once | RESPIRATORY_TRACT | Status: AC
  Administered 2016-10-26: 2.5 mg via RESPIRATORY_TRACT
  Filled 2016-10-26: qty 3

## 2016-10-26 MED ORDER — IPRATROPIUM-ALBUTEROL 0.5-2.5 (3) MG/3ML IN SOLN
3.0000 mL | Freq: Once | RESPIRATORY_TRACT | Status: AC
  Administered 2016-10-26: 3 mL via RESPIRATORY_TRACT
  Filled 2016-10-26: qty 3

## 2016-10-26 NOTE — ED Notes (Signed)
From radiology - Physician in to assess

## 2016-10-26 NOTE — ED Notes (Signed)
Respiratory in for treatment

## 2016-10-26 NOTE — ED Triage Notes (Signed)
Having shortness of breath.  Coughing up green phlegm.  Used in haler at home around 45 minutes ago without relief.  I just became like this today.

## 2016-10-26 NOTE — ED Notes (Signed)
To radiology via stretcher.

## 2016-10-26 NOTE — ED Provider Notes (Signed)
[place] DEPT Provider Note   CSN: [id number] Arrival date & time: 10/26/16  2244  By signing my name below, I, Megan Mcknight, attest that this documentation has been prepared under the direction and in the presence of Megan Greek, MD . Electronically Signed: Jeanell Mcknight, Scribe. 10/26/2016. 11:16 PM.  History   Chief Complaint Chief Complaint  Patient presents with  . Shortness of Breath   The history is provided by the patient. No language interpreter was used.   HPI Comments: Megan Mcknight is a 55 y.o. female with a hx of acute bronchitis and asthma who presents to the Emergency Department complaining of intermittent SOB that started just PTA. She states she started having sudden onset of SOB with associated cough productive of green phlegm. She reports using her albuterol inhaler at home with minimal relief. She denies any other complaints.    PCP: Megan Fire, MD  Past Medical History:  Diagnosis Date  . Acute bronchitis   . Anaplastic large cell lymphoma   . Asthma   . Cancer ([place])    lung and kidney  . Colon polyps 02/2013   Per colonoscopy  . Diabetes mellitus without complication ([place])   . Diverticulosis 02/2013   Per colonoscopy  . Dysrhythmia   . Former light tobacco smoker   . GERD (gastroesophageal reflux disease)   . H/O stem cell transplant ([place]) 2015  . Hyperlipidemia   . Nerve pain    Left leg  . Seasonal allergies     Patient Active Problem List   Diagnosis Date Noted  . PTTD (posterior tibial tendon dysfunction) 08/21/2015  . Transaminitis 05/31/2014  . Inguinal adenopathy 10/20/2013  . Breast mass, left 10/20/2013  . Neuropathy due to chemotherapeutic drug ([place]) 09/29/2013  . Tumor lysis syndrome 08/21/2013  . Hyperglycemia without ketosis 08/21/2013  . Acute renal failure ([place]) 08/21/2013  . Acute encephalopathy 08/21/2013  . Hyperkalemia 08/21/2013  . Lymphoma ([place]) 05/19/2013  . Shortness of breath 05/06/2013  .  Atrial fibrillation with RVR ([place]) 05/06/2013  . Bilateral pneumonia 05/06/2013  . Acute renal insufficiency 05/06/2013  . Normocytic anemia 05/06/2013  . GERD (gastroesophageal reflux disease) 02/02/2012    Past Surgical History:  Procedure Laterality Date  . bones removed from Baby toes    . COLONOSCOPY  02/16/2012   Procedure: COLONOSCOPY;  Surgeon: Daneil Dolin, MD;  [facility];  Service: Endoscopy;  Laterality: N/A;  1:30  . MEDIASTINOSCOPY N/A 05/13/2013   Procedure: MEDIASTINOSCOPY;  Surgeon: Melrose Nakayama, MD;  Location: [place];  Service: Thoracic;  Laterality: N/A;  . PARTIAL HYSTERECTOMY    . VAGINAL HYSTERECTOMY     partial hyst   . VIDEO BRONCHOSCOPY WITH ENDOBRONCHIAL ULTRASOUND N/A 05/13/2013   Procedure: VIDEO BRONCHOSCOPY WITH ENDOBRONCHIAL ULTRASOUND;  Surgeon: Melrose Nakayama, MD;  Location: [place];  Service: Thoracic;  Laterality: N/A;    OB History    No data available       Home Medications    Prior to Admission medications   Medication Sig Start Date End Date Taking? Authorizing Provider  amLODipine (NORVASC) 5 MG tablet Take 5 mg by mouth daily.    Historical Provider, MD  insulin glargine (LANTUS) 100 UNIT/ML injection Inject 40 Units into the skin as needed.    Historical Provider, MD  lansoprazole (PREVACID) 15 MG capsule Take 15 mg by mouth as needed.    Historical Provider, MD  losartan (COZAAR) 25 MG tablet Take 25 mg by mouth  daily. As directed 02/13/16   Historical Provider, MD  metFORMIN (GLUCOPHAGE) 500 MG tablet Take by mouth 2 (two) times daily with a meal.    Historical Provider, MD  metoprolol tartrate (LOPRESSOR) 25 MG tablet Take 25 mg by mouth 3 (three) times daily.    Historical Provider, MD  montelukast (SINGULAIR) 10 MG tablet Take 10 mg by mouth at bedtime.    Historical Provider, MD  rosuvastatin (CRESTOR) 10 MG tablet Take 10 mg by mouth daily.    Historical Provider, MD  VENTOLIN HFA 108 (90 Base) MCG/ACT inhaler  INHALE 1 PUFF FOUR TIMES A DAY AS NEEDED 06/04/16   Historical Provider, MD    Family History Family History  Problem Relation Age of Onset  . Colon polyps Mother   . Hypertension Mother   . Heart disease Mother   . Cancer Father     prostate  . Hypertension Father   . Heart defect      family history   . Asthma      family history     Social History Social History  Substance Use Topics  . Smoking status: Former Smoker    Packs/day: 0.50    Types: Cigarettes    Quit date: 04/19/2013  . Smokeless tobacco: Never Used  . Alcohol use No     Comment: one glass every three weeks     Allergies   Shellfish-derived products   Review of Systems Review of Systems  Respiratory: Positive for cough (Productive) and shortness of breath.   All other systems reviewed and are negative.    Physical Exam Updated Vital Signs BP 129/91   Pulse (!) 57   Temp 98.2 F (36.8 C) (Oral)   Resp 24   Ht '5\' 9"'$  (1.753 m)   Wt 261 lb (118.4 kg)   SpO2 100%   BMI 38.54 kg/m   Physical Exam  Constitutional: She is oriented to person, place, and time. She appears well-developed and well-nourished. No distress.  HENT:  Head: Normocephalic and atraumatic.  Right Ear: Hearing normal.  Left Ear: Hearing normal.  Nose: Nose normal.  Mouth/Throat: Oropharynx is clear and moist and mucous membranes are normal.  Eyes: Conjunctivae and EOM are normal. Pupils are equal, round, and reactive to light.  Neck: Normal range of motion. Neck supple.  Cardiovascular: Regular rhythm, S1 normal and S2 normal.  Exam reveals no gallop and no friction rub.   No murmur heard. Pulmonary/Chest: Effort normal. No respiratory distress. She exhibits no tenderness.  Diminished breath sounds bilaterally.  Abdominal: Soft. Normal appearance and bowel sounds are normal. There is no hepatosplenomegaly. There is no tenderness. There is no rebound, no guarding, no tenderness at McBurney's point and negative Murphy's sign.  No hernia.  Musculoskeletal: Normal range of motion.  Neurological: She is alert and oriented to person, place, and time. She has normal strength. No cranial nerve deficit or sensory deficit. Coordination normal. GCS eye subscore is 4. GCS verbal subscore is 5. GCS motor subscore is 6.  Skin: Skin is warm, dry and intact. No rash noted. No cyanosis.  Psychiatric: She has a normal mood and affect. Her speech is normal and behavior is normal. Thought content normal.  Nursing note and vitals reviewed.    ED Treatments / Results  DIAGNOSTIC STUDIES: Oxygen Saturation is 100% on RA, normal by my interpretation.    COORDINATION OF CARE: 11:20 PM- Pt advised of plan for treatment and pt agrees.  Labs (all labs ordered are  listed, but only abnormal results are displayed) Labs Reviewed  CBG MONITORING, ED    EKG  EKG Interpretation None       Radiology Dg Chest 2 View  Result Date: 10/26/2016 CLINICAL DATA:  Productive cough and dyspnea, worsening tonight. EXAM: CHEST  2 VIEW COMPARISON:  08/21/2013 FINDINGS: The lungs are clear except for minor linear scarring or atelectasis in the right base. No airspace consolidation. No effusions. Normal pulmonary vasculature. Hilar and mediastinal contours are unremarkable and unchanged. IMPRESSION: Minimal linear scarring or atelectasis in the right base. Electronically Signed   By: Andreas Newport M.D.   On: 10/26/2016 23:37    Procedures Procedures (including critical care time)  Medications Ordered in ED Medications  ipratropium-albuterol (DUONEB) 0.5-2.5 (3) MG/3ML nebulizer solution 3 mL (3 mLs Nebulization Given 10/26/16 2344)  albuterol (PROVENTIL) (2.5 MG/3ML) 0.083% nebulizer solution 2.5 mg (2.5 mg Nebulization Given 10/26/16 2344)     Initial Impression / Assessment and Plan / ED Course  I have reviewed the triage vital signs and the nursing notes.  Pertinent labs & imaging results that were available during my care of the  patient were reviewed by me and considered in my medical decision making (see chart for details).  Clinical Course    For evaluation of shortness of breath. Patient does have a history of reactive airway disease. She reports that she began having a cough this evening and has used her inhaler without improvement. Patient reports cough is productive of green sputum. No chest pain or fever. Chest x-ray is clear. Air movement was slightly diminished but no wheezing. She improved with nebulization treatment, now symptomatically feeling better. We are oxygen saturation 100%. Patient appropriate for continued outpatient management.  Final Clinical Impressions(s) / ED Diagnoses   Final diagnoses:  Bronchitis  Mild intermittent asthma with acute exacerbation    New Prescriptions New Prescriptions   No medications on file   I personally performed the services described in this documentation, which was scribed in my presence. The recorded information has been reviewed and is accurate.      Megan Greek, MD 10/27/16 [phone]

## 2016-10-26 NOTE — ED Notes (Signed)
Pt reports hx of asthma as well as non hodkins lymphoma- She is a former smoker. Spouse smokes but not in house. She has a complaint of cough with green expectorant as well as shortness of breath increasing tonight-

## 2016-10-27 DIAGNOSIS — J4521 Mild intermittent asthma with (acute) exacerbation: Secondary | ICD-10-CM | POA: Diagnosis not present

## 2016-10-27 LAB — CBG MONITORING, ED: Glucose-Capillary: 84 mg/dL (ref 65–99)

## 2016-10-27 MED ORDER — PREDNISONE 20 MG PO TABS: 40.0000 mg | ORAL_TABLET | Freq: Every day | ORAL | 0 refills | Status: DC

## 2016-10-27 MED ORDER — BENZONATATE 100 MG PO CAPS: 100.0000 mg | ORAL_CAPSULE | Freq: Three times a day (TID) | ORAL | 0 refills | Status: DC

## 2016-10-27 MED ORDER — ALBUTEROL SULFATE HFA 108 (90 BASE) MCG/ACT IN AERS: 2.0000 | INHALATION_SPRAY | RESPIRATORY_TRACT | 2 refills | Status: AC | PRN

## 2016-10-27 NOTE — ED Notes (Signed)
Physician in for reassessment

## 2016-10-27 NOTE — ED Notes (Signed)
LS continue diminished, but there are no wheezes discerned- pt reports that she is breathing better

## 2016-10-30 ENCOUNTER — Ambulatory Visit (HOSPITAL_COMMUNITY): Payer: Medicare Other

## 2016-10-31 ENCOUNTER — Ambulatory Visit (HOSPITAL_COMMUNITY): Payer: Medicare Other

## 2016-11-11 DIAGNOSIS — H33011 Retinal detachment with single break, right eye: Secondary | ICD-10-CM | POA: Diagnosis not present

## 2016-11-11 DIAGNOSIS — H40013 Open angle with borderline findings, low risk, bilateral: Secondary | ICD-10-CM | POA: Diagnosis not present

## 2016-11-11 DIAGNOSIS — H2513 Age-related nuclear cataract, bilateral: Secondary | ICD-10-CM | POA: Diagnosis not present

## 2017-01-13 ENCOUNTER — Other Ambulatory Visit (HOSPITAL_COMMUNITY): Payer: Self-pay | Admitting: Internal Medicine

## 2017-01-13 DIAGNOSIS — Z1231 Encounter for screening mammogram for malignant neoplasm of breast: Secondary | ICD-10-CM

## 2017-01-19 ENCOUNTER — Encounter: Payer: Self-pay | Admitting: *Deleted

## 2017-01-22 ENCOUNTER — Encounter: Payer: Self-pay | Admitting: Internal Medicine

## 2017-01-22 ENCOUNTER — Ambulatory Visit (HOSPITAL_COMMUNITY)
Admission: RE | Admit: 2017-01-22 | Discharge: 2017-01-22 | Disposition: A | Discharge status: Home / Self Care | Payer: BLUE CROSS/BLUE SHIELD | Source: Ambulatory Visit | Attending: Internal Medicine | Admitting: Internal Medicine

## 2017-01-22 DIAGNOSIS — Z1231 Encounter for screening mammogram for malignant neoplasm of breast: Secondary | ICD-10-CM | POA: Diagnosis present

## 2017-01-29 ENCOUNTER — Telehealth: Payer: Self-pay

## 2017-01-29 NOTE — Telephone Encounter (Signed)
[phone]  PT DUE FOR 5 YR TCS AND HAS BCBS, MAY NOT NEED AN OV

## 2017-02-05 NOTE — Telephone Encounter (Signed)
LMOM to call.

## 2017-02-16 NOTE — Addendum Note (Signed)
Addended by: Mahala Menghini on: 02/16/2017 07:20 PM   Modules accepted: Orders

## 2017-02-16 NOTE — Telephone Encounter (Addendum)
Gastroenterology Pre-Procedure Review  Request Date: Requesting Physician:   PATIENT REVIEW QUESTIONS: The patient responded to the following health history questions as indicated:    1. Diabetes Melitis: YES 2. Joint replacements in the past 12 months: NO 3. Major health problems in the past 3 months: NO 4. Has an artificial valve or MVP: NO 5. Has a defibrillator: NO 6. Has been advised in past to take antibiotics in advance of a procedure like teeth cleaning: NO 7. Family history of colon cancer: NO 8. Alcohol Use: NO 9. History of sleep apnea: NO  10. History of coronary artery or other vascular stents placed within the last 12 months: NO    MEDICATIONS & ALLERGIES:    Patient reports the following regarding taking any blood thinners:   Plavix? NO Aspirin? NO Coumadin? NO Brilinta? NO Xarelto? NO Eliquis? NO Pradaxa? NO Savaysa? NO Effient? NO  Patient confirms/reports the following medications:  Current Outpatient Prescriptions  Medication Sig Dispense Refill  . albuterol (PROVENTIL HFA;VENTOLIN HFA) 108 (90 Base) MCG/ACT inhaler Inhale 2 puffs into the lungs every 4 (four) hours as needed for wheezing or shortness of breath. 1 Inhaler 2  . amLODipine (NORVASC) 5 MG tablet Take 5 mg by mouth daily.    . insulin glargine (LANTUS) 100 UNIT/ML injection Inject 20 Units into the skin as needed.     . lansoprazole (PREVACID) 15 MG capsule Take 15 mg by mouth as needed.    Marland Kitchen losartan (COZAAR) 25 MG tablet Take 25 mg by mouth daily. As directed  3  . metFORMIN (GLUCOPHAGE) 500 MG tablet Take by mouth 2 (two) times daily with a meal.    . metoprolol tartrate (LOPRESSOR) 25 MG tablet Take 25 mg by mouth 3 (three) times daily.    . montelukast (SINGULAIR) 10 MG tablet Take 10 mg by mouth at bedtime.    . rosuvastatin (CRESTOR) 10 MG tablet Take 10 mg by mouth daily.    . VENTOLIN HFA 108 (90 Base) MCG/ACT inhaler INHALE 1 PUFF FOUR TIMES A DAY AS NEEDED  3   No current  facility-administered medications for this visit.     Patient confirms/reports the following allergies:  Allergies  Allergen Reactions  . Shellfish-Derived Products Anaphylaxis and Swelling    Eyes swelling, scratchy throat, bumps on lips.    No orders of the defined types were placed in this encounter.   AUTHORIZATION INFORMATION Primary Insurance: BCBS   ID #: [id number],  Group #: [id number] Pre-Cert / Josem Kaufmann required:  Pre-Cert / Auth #:   SCHEDULE INFORMATION: Procedure has been scheduled as follows:  Date: , Time:   Location:   This Gastroenterology Pre-Precedure Review Form is being routed to the following provider(s): ROURK

## 2017-02-16 NOTE — Telephone Encounter (Signed)
NO PA is needed

## 2017-02-16 NOTE — Telephone Encounter (Signed)
OK to schedule.  Day before procedure: lantus 1/2 dose, metformin 1/2 dose Morning of procedure: Hold lantus and metformin. Take norvasc, cozaar, lopressor.

## 2017-02-17 ENCOUNTER — Other Ambulatory Visit: Payer: Self-pay

## 2017-02-17 DIAGNOSIS — Z1211 Encounter for screening for malignant neoplasm of colon: Secondary | ICD-10-CM

## 2017-02-17 MED ORDER — PEG 3350-KCL-NA BICARB-NACL 420 G PO SOLR: 4000.0000 mL | ORAL | 0 refills | Status: DC

## 2017-02-17 NOTE — Telephone Encounter (Signed)
NO PA is needed

## 2017-02-17 NOTE — Telephone Encounter (Signed)
LMOM to call back

## 2017-02-17 NOTE — Telephone Encounter (Signed)
Pt is set up for TCS on 03/26/17 @ 9:30. She is aware and instructions are in the mail.

## 2017-02-18 ENCOUNTER — Other Ambulatory Visit: Payer: Medicare Other

## 2017-02-18 ENCOUNTER — Ambulatory Visit: Payer: Medicare Other | Admitting: Oncology

## 2017-02-27 ENCOUNTER — Other Ambulatory Visit (HOSPITAL_BASED_OUTPATIENT_CLINIC_OR_DEPARTMENT_OTHER): Payer: BLUE CROSS/BLUE SHIELD

## 2017-02-27 ENCOUNTER — Telehealth: Payer: Self-pay | Admitting: Oncology

## 2017-02-27 ENCOUNTER — Ambulatory Visit (HOSPITAL_BASED_OUTPATIENT_CLINIC_OR_DEPARTMENT_OTHER): Payer: BLUE CROSS/BLUE SHIELD | Admitting: Oncology

## 2017-02-27 VITALS — BP 130/84 | HR 71 | Temp 98.2°F | Resp 18 | Ht 69.0 in | Wt 258.7 lb

## 2017-02-27 DIAGNOSIS — C8460 Anaplastic large cell lymphoma, ALK-positive, unspecified site: Secondary | ICD-10-CM | POA: Diagnosis not present

## 2017-02-27 LAB — COMPREHENSIVE METABOLIC PANEL
ALT: 19 U/L (ref 0–55)
AST: 20 U/L (ref 5–34)
Albumin: 4.1 g/dL (ref 3.5–5.0)
Alkaline Phosphatase: 46 U/L (ref 40–150)
Anion Gap: 7 mEq/L (ref 3–11)
BUN: 13.3 mg/dL (ref 7.0–26.0)
CO2: 28 mEq/L (ref 22–29)
Calcium: 10.1 mg/dL (ref 8.4–10.4)
Chloride: 109 mEq/L (ref 98–109)
Creatinine: 1 mg/dL (ref 0.6–1.1)
EGFR: 73 mL/min/{1.73_m2} — ABNORMAL LOW (ref >90)
Glucose: 100 mg/dl (ref 70–140)
Potassium: 3.8 mEq/L (ref 3.5–5.1)
Sodium: 145 mEq/L (ref 136–145)
Total Bilirubin: 0.33 mg/dL (ref 0.20–1.20)
Total Protein: 7.3 g/dL (ref 6.4–8.3)

## 2017-02-27 LAB — CBC WITH DIFFERENTIAL/PLATELET
BASO%: 1.1 % (ref 0.0–2.0)
Basophils Absolute: 0.1 10*3/uL (ref 0.0–0.1)
EOS%: 2.9 % (ref 0.0–7.0)
Eosinophils Absolute: 0.2 10*3/uL (ref 0.0–0.5)
HCT: 37 % (ref 34.8–46.6)
HGB: 11.9 g/dL (ref 11.6–15.9)
LYMPH%: 53.2 % — ABNORMAL HIGH (ref 14.0–49.7)
MCH: 27 pg (ref 25.1–34.0)
MCHC: 32.1 g/dL (ref 31.5–36.0)
MCV: 84.1 fL (ref 79.5–101.0)
MONO#: 0.4 10*3/uL (ref 0.1–0.9)
MONO%: 4.7 % (ref 0.0–14.0)
NEUT#: 3.1 10*3/uL (ref 1.5–6.5)
NEUT%: 38.1 % — ABNORMAL LOW (ref 38.4–76.8)
Platelets: 189 10*3/uL (ref 145–400)
RBC: 4.4 10*6/uL (ref 3.70–5.45)
RDW: 14.9 % — ABNORMAL HIGH (ref 11.2–14.5)
WBC: 8.2 10*3/uL (ref 3.9–10.3)
lymph#: 4.4 10*3/uL — ABNORMAL HIGH (ref 0.9–3.3)

## 2017-02-27 NOTE — Progress Notes (Signed)
[name] OFFICE PROGRESS NOTE 02/27/17   [facility], MD [address] [place] [address]  DIAGNOSIS: 56 year old woman diagnosed with anaplastic large cell lymphoma CD30 and ALK positive, CD20 negative in 2014.   PRIOR THERAPY:  CHOEP q 21 days started on 05/20/2013.  Completed 6 of 6 planned cycles for CHOEP on 09/05/2013.  Cycle #5 was complicated by ICU-level care secondary to severe hyperglycemia (see prior note for details).  Cycle #6, prednisone was excluded.  She received neulasta throughout her therapy.   She underwent autologous SCT on February 4th 2015 at [facility] ([place] [place]).  She continues to be in remission since that time.  CURRENT THERAPY:  Active surveillance.  INTERVAL HISTORY: Megan Mcknight presents today for a follow-up visit. Since the last visit, she continues to be in excellent health and shape without any recent complaints. She did have a flu illness on separate occasions which has resolved at this time. She no longer reporting any fevers, chills or arthralgias. She denied any lymphadenopathy or decline in her performance status or activity level. She continues to work full time and attends activities of daily living.    She does not report any headaches, blurry vision, syncope or seizures. She denies any nausea/vomiting/fever or chills. He denies any chest pain, palpitations, worsening fatigue. She denies any pain. She denied cough. She denied a mucositis or night sweats. She does not report any cough, wheezing or hemoptysis. She does not report any constipation, diarrhea or hematochezia. She does not report any frequency urgency or hesitancy. The remaining review of systems unremarkable.  MEDICAL HISTORY: This was reviewed and unchanged at this time. Past Medical History:  Diagnosis Date  . Acute bronchitis   . Anaplastic large cell lymphoma   . Asthma   . Cancer ([place])    lung and kidney  . Colon polyps 02/2013   Per  colonoscopy  . Diabetes mellitus without complication ([place])   . Diverticulosis 02/2013   Per colonoscopy  . Dysrhythmia   . Former light tobacco smoker   . GERD (gastroesophageal reflux disease)   . H/O stem cell transplant ([place]) 2015  . Hyperlipidemia   . Nerve pain    Left leg  . Seasonal allergies     ALLERGIES:  is allergic to shellfish-derived products.  MEDICATIONS: has a current medication list which includes the following prescription(s): amlodipine, insulin glargine, lansoprazole, losartan, metformin, metoprolol tartrate, montelukast, rosuvastatin, and albuterol.    PHYSICAL EXAMINATION: ECOG PERFORMANCE STATUS: 0 - Asymptomatic Blood pressure 130/84, pulse 71, temperature 98.2 F (36.8 C), temperature source Oral, resp. rate 18, height _0  (1.753 m), weight 258 lb 11.2 oz (117.3 kg), SpO2 100 %.    GENERAL: Alert, awake woman without distress. SKIN: No rashes or lesions.  EYES: normal, sclera clear nonicteric. OROPHARYNX:. No thrush, ulcers or lesions.  NECK: supple, thyroid normal size, non-tender.  LYMPH:  no palpable lymphadenopathy in the cervical, axillary or supraclavicular; no inguinal adenopathy noted. LUNGS: clear to auscultation and percussion with normal breathing effort.  HEART: regular rate & rhythm and no murmurs and no lower extremity edema ABDOMEN: Soft, nontender without rebound or guarding. Good bowel sounds. Musculoskeletal:no cyanosis of digits and no clubbing;  NEURO: No deficits. Motor, sensory are deep tendon reflexes.   Labs:  Lab Results  Component Value Date   WBC 8.2 02/27/2017   HGB 11.9 02/27/2017   HCT 37.0 02/27/2017   MCV 84.1 02/27/2017   PLT 189 02/27/2017  NEUTROABS 3.1 02/27/2017      Chemistry      Component Value Date/Time   NA 145 08/20/2016 1021   K 3.6 08/20/2016 1021   CL 106 07/26/2014 1440   CO2 26 08/20/2016 1021   BUN 18.1 08/20/2016 1021   CREATININE 1.0 08/20/2016 1021      Component Value  Date/Time   CALCIUM 9.3 08/20/2016 1021   ALKPHOS 53 08/20/2016 1021   AST 17 08/20/2016 1021   ALT 20 08/20/2016 1021   BILITOT 0.39 08/20/2016 1021       ASSESSMENT AND PLAN:    56 year old woman with the following issues:    1. 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  She received chemotherapy initially and subsequently  underwent high dose BEAM autologous SCT on 01/18/2014 (day #0) and have recovered well. She continues to have no evidence of recurrent disease at this time.  The plan is to continue with active surveillance with laboratory testing every 6 months and imaging studies as needed. She continues to follow on an annual basis at [facility].     2. Age-appropriate cancer screening: She is up-to-date with her mammography and scheduled to have a repeat colonoscopy in the near future.  3. Port-A-Cath management: This has been removed in March 2017.  4. Vaccinations: She is up-to-date at this time.  5. Follow-up: Will be in 6 months.    [facility] MD 02/27/17

## 2017-02-27 NOTE — Telephone Encounter (Signed)
Appointments scheduled per 3/16 LOS. Patient given AVS report and calendars with future scheduled appointments.

## 2017-03-23 NOTE — Telephone Encounter (Signed)
Pt called office back. Advised pt next available appt would be 04/23/17. She said she would call her manager and call our office back.  Pt called office back and she want to keep procedure for 03/26/17. She was able to get off from work.

## 2017-03-23 NOTE — Telephone Encounter (Signed)
Pt called office and LMOVM that she wanted to reschedule TCS that is scheduled for 03/26/17 d/t she has to work that day. Tried to call pt back, LMOVM for her to call office to reschedule procedure.

## 2017-03-24 ENCOUNTER — Telehealth: Payer: Self-pay

## 2017-03-24 NOTE — Telephone Encounter (Signed)
I called and LMOM for a return call to update triage prior to procedure on 03/26/2017.

## 2017-03-25 NOTE — Telephone Encounter (Signed)
PT called and left Vm that there has been no changes in her meds since she was triaged.

## 2017-03-25 NOTE — Telephone Encounter (Signed)
Noted  

## 2017-03-25 NOTE — Telephone Encounter (Signed)
FYI to Leslie Lewis, PA.  

## 2017-03-26 ENCOUNTER — Ambulatory Visit (HOSPITAL_COMMUNITY)
Admission: RE | Admit: 2017-03-26 | Discharge: 2017-03-26 | Disposition: A | Discharge status: Home / Self Care | Payer: BLUE CROSS/BLUE SHIELD | Source: Ambulatory Visit | Attending: Internal Medicine | Admitting: Internal Medicine

## 2017-03-26 ENCOUNTER — Encounter (HOSPITAL_COMMUNITY)
Admission: RE | Disposition: A | Discharge status: Home / Self Care | Payer: Self-pay | Source: Ambulatory Visit | Attending: Internal Medicine

## 2017-03-26 ENCOUNTER — Encounter (HOSPITAL_COMMUNITY): Payer: Self-pay | Admitting: *Deleted

## 2017-03-26 DIAGNOSIS — Z86010 Personal history of colonic polyps: Secondary | ICD-10-CM | POA: Diagnosis not present

## 2017-03-26 DIAGNOSIS — K5730 Diverticulosis of large intestine without perforation or abscess without bleeding: Secondary | ICD-10-CM | POA: Insufficient documentation

## 2017-03-26 DIAGNOSIS — Z79899 Other long term (current) drug therapy: Secondary | ICD-10-CM | POA: Diagnosis not present

## 2017-03-26 DIAGNOSIS — Z800 Family history of malignant neoplasm of digestive organs: Secondary | ICD-10-CM

## 2017-03-26 DIAGNOSIS — Z91013 Allergy to seafood: Secondary | ICD-10-CM | POA: Insufficient documentation

## 2017-03-26 DIAGNOSIS — Z8249 Family history of ischemic heart disease and other diseases of the circulatory system: Secondary | ICD-10-CM | POA: Diagnosis not present

## 2017-03-26 DIAGNOSIS — Z85528 Personal history of other malignant neoplasm of kidney: Secondary | ICD-10-CM | POA: Insufficient documentation

## 2017-03-26 DIAGNOSIS — K219 Gastro-esophageal reflux disease without esophagitis: Secondary | ICD-10-CM | POA: Diagnosis not present

## 2017-03-26 DIAGNOSIS — Z8572 Personal history of non-Hodgkin lymphomas: Secondary | ICD-10-CM | POA: Insufficient documentation

## 2017-03-26 DIAGNOSIS — Z1212 Encounter for screening for malignant neoplasm of rectum: Secondary | ICD-10-CM | POA: Diagnosis not present

## 2017-03-26 DIAGNOSIS — Z87891 Personal history of nicotine dependence: Secondary | ICD-10-CM | POA: Diagnosis not present

## 2017-03-26 DIAGNOSIS — E119 Type 2 diabetes mellitus without complications: Secondary | ICD-10-CM | POA: Insufficient documentation

## 2017-03-26 DIAGNOSIS — Z8042 Family history of malignant neoplasm of prostate: Secondary | ICD-10-CM | POA: Diagnosis not present

## 2017-03-26 DIAGNOSIS — Z90710 Acquired absence of both cervix and uterus: Secondary | ICD-10-CM | POA: Insufficient documentation

## 2017-03-26 DIAGNOSIS — Z1211 Encounter for screening for malignant neoplasm of colon: Secondary | ICD-10-CM | POA: Insufficient documentation

## 2017-03-26 DIAGNOSIS — Z825 Family history of asthma and other chronic lower respiratory diseases: Secondary | ICD-10-CM | POA: Insufficient documentation

## 2017-03-26 DIAGNOSIS — Z794 Long term (current) use of insulin: Secondary | ICD-10-CM | POA: Diagnosis not present

## 2017-03-26 DIAGNOSIS — G588 Other specified mononeuropathies: Secondary | ICD-10-CM | POA: Insufficient documentation

## 2017-03-26 DIAGNOSIS — Z8371 Family history of colonic polyps: Secondary | ICD-10-CM | POA: Insufficient documentation

## 2017-03-26 DIAGNOSIS — E785 Hyperlipidemia, unspecified: Secondary | ICD-10-CM | POA: Diagnosis not present

## 2017-03-26 DIAGNOSIS — K635 Polyp of colon: Secondary | ICD-10-CM

## 2017-03-26 DIAGNOSIS — J45909 Unspecified asthma, uncomplicated: Secondary | ICD-10-CM | POA: Insufficient documentation

## 2017-03-26 DIAGNOSIS — D125 Benign neoplasm of sigmoid colon: Secondary | ICD-10-CM | POA: Diagnosis not present

## 2017-03-26 DIAGNOSIS — Z85118 Personal history of other malignant neoplasm of bronchus and lung: Secondary | ICD-10-CM | POA: Insufficient documentation

## 2017-03-26 DIAGNOSIS — I499 Cardiac arrhythmia, unspecified: Secondary | ICD-10-CM | POA: Insufficient documentation

## 2017-03-26 HISTORY — PX: COLONOSCOPY: SHX5424

## 2017-03-26 LAB — GLUCOSE, CAPILLARY: Glucose-Capillary: 91 mg/dL (ref 65–99)

## 2017-03-26 SURGERY — COLONOSCOPY
Anesthesia: Moderate Sedation

## 2017-03-26 MED ORDER — MIDAZOLAM HCL 5 MG/5ML IJ SOLN
INTRAMUSCULAR | Status: AC
  Filled 2017-03-26: qty 10

## 2017-03-26 MED ORDER — STERILE WATER FOR IRRIGATION IR SOLN
Status: DC | PRN
  Administered 2017-03-26: 09:00:00

## 2017-03-26 MED ORDER — ONDANSETRON HCL 4 MG/2ML IJ SOLN
INTRAMUSCULAR | Status: DC | PRN
  Administered 2017-03-26: 4 mg via INTRAVENOUS

## 2017-03-26 MED ORDER — MEPERIDINE HCL 100 MG/ML IJ SOLN
INTRAMUSCULAR | Status: DC | PRN
  Administered 2017-03-26: 50 mg via INTRAVENOUS
  Administered 2017-03-26 (×2): 25 mg via INTRAVENOUS

## 2017-03-26 MED ORDER — SODIUM CHLORIDE 0.9 % IV SOLN
INTRAVENOUS | Status: DC
  Administered 2017-03-26: 09:00:00 via INTRAVENOUS

## 2017-03-26 MED ORDER — ONDANSETRON HCL 4 MG/2ML IJ SOLN
INTRAMUSCULAR | Status: AC
  Filled 2017-03-26: qty 2

## 2017-03-26 MED ORDER — MIDAZOLAM HCL 5 MG/5ML IJ SOLN
INTRAMUSCULAR | Status: DC | PRN
  Administered 2017-03-26: 1 mg via INTRAVENOUS
  Administered 2017-03-26: 2 mg via INTRAVENOUS
  Administered 2017-03-26 (×2): 1 mg via INTRAVENOUS

## 2017-03-26 MED ORDER — MEPERIDINE HCL 100 MG/ML IJ SOLN
INTRAMUSCULAR | Status: AC
  Filled 2017-03-26: qty 2

## 2017-03-26 NOTE — Discharge Instructions (Addendum)
Colonoscopy Discharge Instructions  Read the instructions outlined below and refer to this sheet in the next few weeks. These discharge instructions provide you with general information on caring for yourself after you leave the hospital. Your doctor may also give you specific instructions. While your treatment has been planned according to the most current medical practices available, unavoidable complications occasionally occur. If you have any problems or questions after discharge, call Dr. Gala Romney at [phone]. ACTIVITY  You may resume your regular activity, but move at a slower pace for the next 24 hours.   Take frequent rest periods for the next 24 hours.   Walking will help get rid of the air and reduce the bloated feeling in your belly (abdomen).   No driving for 24 hours (because of the medicine (anesthesia) used during the test).    Do not sign any important legal documents or operate any machinery for 24 hours (because of the anesthesia used during the test).  NUTRITION  Drink plenty of fluids.   You may resume your normal diet as instructed by your doctor.   Begin with a light meal and progress to your normal diet. Heavy or fried foods are harder to digest and may make you feel sick to your stomach (nauseated).   Avoid alcoholic beverages for 24 hours or as instructed.  MEDICATIONS  You may resume your normal medications unless your doctor tells you otherwise.  WHAT YOU CAN EXPECT TODAY  Some feelings of bloating in the abdomen.   Passage of more gas than usual.   Spotting of blood in your stool or on the toilet paper.  IF YOU HAD POLYPS REMOVED DURING THE COLONOSCOPY:  No aspirin products for 7 days or as instructed.   No alcohol for 7 days or as instructed.   Eat a soft diet for the next 24 hours.  FINDING OUT THE RESULTS OF YOUR TEST Not all test results are available during your visit. If your test results are not back during the visit, make an appointment  with your caregiver to find out the results. Do not assume everything is normal if you have not heard from your caregiver or the medical facility. It is important for you to follow up on all of your test results.  SEEK IMMEDIATE MEDICAL ATTENTION IF:  You have more than a spotting of blood in your stool.   Your belly is swollen (abdominal distention).   You are nauseated or vomiting.   You have a temperature over 101.   You have abdominal pain or discomfort that is severe or gets worse throughout the day.    Polyp and diverticulosis information provided  Further recommendations to follow pending review of pathology report    Colon Polyps Polyps are tissue growths inside the body. Polyps can grow in many places, including the large intestine (colon). A polyp may be a round bump or a mushroom-shaped growth. You could have one polyp or several. Most colon polyps are noncancerous (benign). However, some colon polyps can become cancerous over time. What are the causes? The exact cause of colon polyps is not known. What increases the risk? This condition is more likely to develop in people who:  Have a family history of colon cancer or colon polyps.  Are older than 29 or older than 45 if they are African American.  Have inflammatory bowel disease, such as ulcerative colitis or Crohn disease.  Are overweight.  Smoke cigarettes.  Do not get enough exercise.  Drink too  much alcohol.  Eat a diet that is:  High in fat and red meat.  Low in fiber.  Had childhood cancer that was treated with abdominal radiation. What are the signs or symptoms? Most polyps do not cause symptoms. If you have symptoms, they may include:  Blood coming from your rectum when having a bowel movement.  Blood in your stool.The stool may look dark red or black.  A change in bowel habits, such as constipation or diarrhea. How is this diagnosed? This condition is diagnosed with a colonoscopy. This  is a procedure that uses a lighted, flexible scope to look at the inside of your colon. How is this treated? Treatment for this condition involves removing any polyps that are found. Those polyps will then be tested for cancer. If cancer is found, your health care provider will talk to you about options for colon cancer treatment. Follow these instructions at home: Diet   Eat plenty of fiber, such as fruits, vegetables, and whole grains.  Eat foods that are high in calcium and vitamin D, such as milk, cheese, yogurt, eggs, liver, fish, and broccoli.  Limit foods high in fat, red meats, and processed meats, such as hot dogs, sausage, bacon, and lunch meats.  Maintain a healthy weight, or lose weight if recommended by your health care provider. General instructions   Do not smoke cigarettes.  Do not drink alcohol excessively.  Keep all follow-up visits as told by your health care provider. This is important. This includes keeping regularly scheduled colonoscopies. Talk to your health care provider about when you need a colonoscopy.  Exercise every day or as told by your health care provider. Contact a health care provider if:  You have new or worsening bleeding during a bowel movement.  You have new or increased blood in your stool.  You have a change in bowel habits.  You unexpectedly lose weight. This information is not intended to replace advice given to you by your health care provider. Make sure you discuss any questions you have with your health care provider. Document Released: 08/27/2004 Document Revised: 05/08/2016 Document Reviewed: 10/22/2015 Elsevier Interactive Patient Education  2017 [employer]    Diverticulosis Diverticulosis is a condition that develops when small pouches (diverticula) form in the wall of the large intestine (colon). The colon is where water is absorbed and stool is formed. The pouches form when the inside layer of the colon pushes through weak  spots in the outer layers of the colon. You may have a few pouches or many of them. What are the causes? The cause of this condition is not known. What increases the risk? The following factors may make you more likely to develop this condition:  Being older than age 26. Your risk for this condition increases with age. Diverticulosis is rare among people younger than age 85. By age 53, many people have it.  Eating a low-fiber diet.  Having frequent constipation.  Being overweight.  Not getting enough exercise.  Smoking.  Taking over-the-counter pain medicines, like aspirin and ibuprofen.  Having a family history of diverticulosis. What are the signs or symptoms? In most people, there are no symptoms of this condition. If you do have symptoms, they may include:  Bloating.  Cramps in the abdomen.  Constipation or diarrhea.  Pain in the lower left side of the abdomen. How is this diagnosed? This condition is most often diagnosed during an exam for other colon problems. Because diverticulosis usually has no symptoms,  it often cannot be diagnosed independently. This condition may be diagnosed by:  Using a flexible scope to examine the colon (colonoscopy).  Taking an X-ray of the colon after dye has been put into the colon (barium enema).  Doing a CT scan. How is this treated? You may not need treatment for this condition if you have never developed an infection related to diverticulosis. If you have had an infection before, treatment may include:  Eating a high-fiber diet. This may include eating more fruits, vegetables, and grains.  Taking a fiber supplement.  Taking a live bacteria supplement (probiotic).  Taking medicine to relax your colon.  Taking antibiotic medicines. Follow these instructions at home:  Drink 6-8 glasses of water or more each day to prevent constipation.  Try not to strain when you have a bowel movement.  If you have had an infection  before:  Eat more fiber as directed by your health care provider or your diet and nutrition specialist (dietitian).  Take a fiber supplement or probiotic, if your health care provider approves.  Take over-the-counter and prescription medicines only as told by your health care provider.  If you were prescribed an antibiotic, take it as told by your health care provider. Do not stop taking the antibiotic even if you start to feel better.  Keep all follow-up visits as told by your health care provider. This is important. Contact a health care provider if:  You have pain in your abdomen.  You have bloating.  You have cramps.  You have not had a bowel movement in 3 days. Get help right away if:  Your pain gets worse.  Your bloating becomes very bad.  You have a fever or chills, and your symptoms suddenly get worse.  You vomit.  You have bowel movements that are bloody or black.  You have bleeding from your rectum. Summary  Diverticulosis is a condition that develops when small pouches (diverticula) form in the wall of the large intestine (colon).  You may have a few pouches or many of them.  This condition is most often diagnosed during an exam for other colon problems.  If you have had an infection related to diverticulosis, treatment may include increasing the fiber in your diet, taking supplements, or taking medicines. This information is not intended to replace advice given to you by your health care provider. Make sure you discuss any questions you have with your health care provider. Document Released: 08/28/2004 Document Revised: 10/20/2016 Document Reviewed: 10/20/2016 Elsevier Interactive Patient Education  2017 [employer].

## 2017-03-26 NOTE — Op Note (Signed)
[facility] Patient Name: Megan Mcknight Procedure Date: 03/26/2017 8:50 AM MRN: [id number] Date of Birth: Nov 18, 1961 Attending MD: Norvel Richards , MD CSN: [id number] Age: 56 Admit Type: Outpatient Procedure:                Colonoscopy with snare polypectomy Indications:              Colon cancer screening in patient at increased                            risk: Family history of 1st-degree relative with                            colon polyps Providers:                Norvel Richards, MD, Hinton Rao, RN, Aram Candela Referring MD:              Medicines:                Midazolam 5 mg IV, Meperidine 100 mg IV,                            Ondansetron 4 mg IV Complications:            No immediate complications. Estimated Blood Loss:     Estimated blood loss was minimal. Procedure:                Pre-Anesthesia Assessment:                           - Prior to the procedure, a History and Physical                            was performed, and patient medications and                            allergies were reviewed. The patient's tolerance of                            previous anesthesia was also reviewed. The risks                            and benefits of the procedure and the sedation                            options and risks were discussed with the patient.                            All questions were answered, and informed consent                            was obtained. Prior Anticoagulants: The patient has  taken no previous anticoagulant or antiplatelet                            agents. ASA Grade Assessment: III - A patient with                            severe systemic disease. After reviewing the risks                            and benefits, the patient was deemed in                            satisfactory condition to undergo the procedure.                           After obtaining informed  consent, the colonoscope                            was passed under direct vision. Throughout the                            procedure, the patient's blood pressure, pulse, and                            oxygen saturations were monitored continuously. The                            EC-[id number] ([id number]) scope was introduced through                            the anus and advanced to the the cecum, identified                            by appendiceal orifice and ileocecal valve. The                            colonoscopy was performed without difficulty. The                            patient tolerated the procedure well. The quality                            of the bowel preparation was adequate. The                            ileocecal valve, appendiceal orifice, and rectum                            were photographed. The quality of the bowel                            preparation was adequate. Scope In: 9:25:57 AM Scope Out: 9:45:08 AM Scope Withdrawal Time: 0 hours 8 minutes 47 seconds  Total Procedure Duration: 0  hours 19 minutes 11 seconds  Findings:      The perianal and digital rectal examinations were normal.      A 4 mm polyp was found in the sigmoid colon. The polyp was       semi-pedunculated. The polyp was removed with a cold snare. Resection       and retrieval were complete. Estimated blood loss was minimal.      The exam was otherwise without abnormality on direct and retroflexion       views.      A few small-mouthed diverticula were found in the entire colon. Impression:               - One 4 mm polyp in the sigmoid colon, removed with                            a cold snare. Resected and retrieved.                           - The examination was otherwise normal on direct                            and retroflexion views.                           - Diverticulosis in the entire examined colon. Moderate Sedation:      Moderate (conscious) sedation was administered by  the endoscopy nurse       and supervised by the endoscopist. The following parameters were       monitored: oxygen saturation, heart rate, blood pressure, respiratory       rate, EKG, adequacy of pulmonary ventilation, and response to care.       Total physician intraservice time was 27 minutes. Recommendation:           - Patient has a contact number available for                            emergencies. The signs and symptoms of potential                            delayed complications were discussed with the                            patient. Return to normal activities tomorrow.                            Written discharge instructions were provided to the                            patient.                           - Resume previous diet.                           - Continue present medications.                           -  Repeat colonoscopy date to be determined after                            pending pathology results are reviewed for                            surveillance based on pathology results.                           - Return to GI clinic (date not yet determined). Procedure Code(s):        --- Professional ---                           [phone], Colonoscopy, flexible; with removal of                            tumor(s), polyp(s), or other lesion(s) by snare                            technique                           99152, Moderate sedation services provided by the                            same physician or other qualified health care                            professional performing the diagnostic or                            therapeutic service that the sedation supports,                            requiring the presence of an independent trained                            observer to assist in the monitoring of the                            patient's level of consciousness and physiological                            status; initial 15 minutes of intraservice  time,                            patient age 17 years or older                           [phone], Moderate sedation services; each additional                            15 minutes intraservice time Diagnosis Code(s):        --- Professional ---  Z83.71, Family history of colonic polyps                           D12.5, Benign neoplasm of sigmoid colon                           K57.30, Diverticulosis of large intestine without                            perforation or abscess without bleeding CPT copyright 2016 American Medical Association. All rights reserved. The codes documented in this report are preliminary and upon coder review may  be revised to meet current compliance requirements. Cristopher Estimable. Jadasia Haws, MD Norvel Richards, MD 03/26/2017 9:56:10 AM This report has been signed electronically. Number of Addenda: 0

## 2017-03-26 NOTE — H&P (Signed)
$'@LOGO'L$ @   Primary Care Physician:  Rosita Fire, MD Primary Gastroenterologist:  Dr. Gala Romney  Pre-Procedure History & Physical: HPI:  Megan Mcknight is a 56 y.o. female is here for a screening colonoscopy.  High risk maneuver. Positive family history of precancerous polyps. Hyperplastic polyp removed: 5 years ago. No bowel symptoms currently.  Past Medical History:  Diagnosis Date  . Acute bronchitis   . Anaplastic large cell lymphoma   . Asthma   . Cancer ([place])    lung and kidney  . Colon polyps 02/2013   Per colonoscopy  . Diabetes mellitus without complication ([place])   . Diverticulosis 02/2013   Per colonoscopy  . Dysrhythmia   . Former light tobacco smoker   . GERD (gastroesophageal reflux disease)   . H/O stem cell transplant ([place]) 2015  . Hyperlipidemia   . Nerve pain    Left leg  . Seasonal allergies     Past Surgical History:  Procedure Laterality Date  . bones removed from Baby toes    . COLONOSCOPY  02/16/2012   Procedure: COLONOSCOPY;  Surgeon: Daneil Dolin, MD;  Location[facility];  Service: Endoscopy;  Laterality: N/A;  1:30  . MEDIASTINOSCOPY N/A 05/13/2013   Procedure: MEDIASTINOSCOPY;  Surgeon: Melrose Nakayama, MD;  Location: [facility];  Service: Thoracic;  Laterality: N/A;  . PARTIAL HYSTERECTOMY    . VAGINAL HYSTERECTOMY     partial hyst   . VIDEO BRONCHOSCOPY WITH ENDOBRONCHIAL ULTRASOUND N/A 05/13/2013   Procedure: VIDEO BRONCHOSCOPY WITH ENDOBRONCHIAL ULTRASOUND;  Surgeon: Melrose Nakayama, MD;  Location: [place];  Service: Thoracic;  Laterality: N/A;    Prior to Admission medications   Medication Sig Start Date End Date Taking? Authorizing Provider  amLODipine (NORVASC) 5 MG tablet Take 5 mg by mouth daily.   Yes Historical Provider, MD  insulin glargine (LANTUS) 100 UNIT/ML injection Inject 20 Units into the skin as needed.    Yes Historical Provider, MD  losartan (COZAAR) 25 MG tablet Take 25 mg by mouth daily. As directed 02/13/16  Yes  Historical Provider, MD  metFORMIN (GLUCOPHAGE) 500 MG tablet Take by mouth 2 (two) times daily with a meal.   Yes Historical Provider, MD  metoprolol tartrate (LOPRESSOR) 25 MG tablet Take 25 mg by mouth 3 (three) times daily.   Yes Historical Provider, MD  montelukast (SINGULAIR) 10 MG tablet Take 10 mg by mouth at bedtime.   Yes Historical Provider, MD  rosuvastatin (CRESTOR) 10 MG tablet Take 10 mg by mouth daily.   Yes Historical Provider, MD  albuterol (PROVENTIL HFA;VENTOLIN HFA) 108 (90 Base) MCG/ACT inhaler Inhale 2 puffs into the lungs every 4 (four) hours as needed for wheezing or shortness of breath. 10/27/16   Orpah Greek, MD  lansoprazole (PREVACID) 15 MG capsule Take 15 mg by mouth as needed.    Historical Provider, MD    Allergies as of 02/17/2017 - Review Complete 02/16/2017  Allergen Reaction Noted  . Shellfish-derived products Anaphylaxis and Swelling 03/25/2011    Family History  Problem Relation Age of Onset  . Colon polyps Mother   . Hypertension Mother   . Heart disease Mother   . Cancer Father     prostate  . Hypertension Father   . Heart defect      family history   . Asthma      family history     Social History   Social History  . Marital status: Married    Spouse name:  N/A  . Number of children: 3  . Years of education: N/A   Occupational History  . technician    .  [employer]   Social History Main Topics  . Smoking status: Former Smoker    Packs/day: 0.50    Types: Cigarettes    Quit date: 04/19/2013  . Smokeless tobacco: Never Used  . Alcohol use No     Comment: one glass every three weeks  . Drug use: No  . Sexual activity: Yes    Birth control/ protection: Surgical   Other Topics Concern  . Not on file   Social History Narrative  . No narrative on file    Review of Systems: See HPI, otherwise negative ROS  Physical Exam: BP 128/72   Pulse (!) 59   Temp 98 F (36.7 C) (Oral)   Resp 12   Ht '5\' 8"'$  (1.727 m)    Wt 254 lb (115.2 kg)   SpO2 100%   BMI 38.62 kg/m  General:   Alert,  Well-developed, well-nourished, pleasant and cooperative in NAD Lungs:  Clear throughout to auscultation.   No wheezes, crackles, or rhonchi. No acute distress. Heart:  Regular rate and rhythm; no murmurs, clicks, rubs,  or gallops. Abdomen:  Soft, nontender and nondistended. No masses, hepatosplenomegaly or hernias noted. Normal bowel sounds, without guarding, and without rebound.    Impression/Plan: Megan Mcknight is now here to undergo a screening colonoscopy.  High risk screening examination.  Risks, benefits, limitations, imponderables and alternatives regarding colonoscopy have been reviewed with the patient. Questions have been answered. All parties agreeable.     Notice:  This dictation was prepared with Dragon dictation along with smaller phrase technology. Any transcriptional errors that result from this process are unintentional and may not be corrected upon review.

## 2017-03-28 ENCOUNTER — Encounter: Payer: Self-pay | Admitting: Internal Medicine

## 2017-03-31 ENCOUNTER — Encounter (HOSPITAL_COMMUNITY): Payer: Self-pay | Admitting: Internal Medicine

## 2017-06-03 ENCOUNTER — Other Ambulatory Visit: Payer: Self-pay | Admitting: Nurse Practitioner

## 2017-08-15 ENCOUNTER — Other Ambulatory Visit: Payer: Self-pay | Admitting: Nurse Practitioner

## 2017-09-04 ENCOUNTER — Ambulatory Visit (HOSPITAL_BASED_OUTPATIENT_CLINIC_OR_DEPARTMENT_OTHER): Payer: BLUE CROSS/BLUE SHIELD | Admitting: Oncology

## 2017-09-04 ENCOUNTER — Telehealth: Payer: Self-pay | Admitting: Oncology

## 2017-09-04 ENCOUNTER — Other Ambulatory Visit (HOSPITAL_BASED_OUTPATIENT_CLINIC_OR_DEPARTMENT_OTHER): Payer: BLUE CROSS/BLUE SHIELD

## 2017-09-04 VITALS — BP 141/77 | HR 83 | Temp 98.7°F | Resp 18 | Ht 68.0 in | Wt 267.0 lb

## 2017-09-04 DIAGNOSIS — C8460 Anaplastic large cell lymphoma, ALK-positive, unspecified site: Secondary | ICD-10-CM

## 2017-09-04 DIAGNOSIS — Z8579 Personal history of other malignant neoplasms of lymphoid, hematopoietic and related tissues: Secondary | ICD-10-CM

## 2017-09-04 LAB — CBC WITH DIFFERENTIAL/PLATELET
BASO%: 0.9 % (ref 0.0–2.0)
Basophils Absolute: 0.1 10*3/uL (ref 0.0–0.1)
EOS%: 2 % (ref 0.0–7.0)
Eosinophils Absolute: 0.1 10*3/uL (ref 0.0–0.5)
HCT: 37 % (ref 34.8–46.6)
HGB: 12 g/dL (ref 11.6–15.9)
LYMPH%: 44.2 % (ref 14.0–49.7)
MCH: 27.2 pg (ref 25.1–34.0)
MCHC: 32.4 g/dL (ref 31.5–36.0)
MCV: 84 fL (ref 79.5–101.0)
MONO#: 0.4 10*3/uL (ref 0.1–0.9)
MONO%: 5.5 % (ref 0.0–14.0)
NEUT#: 3 10*3/uL (ref 1.5–6.5)
NEUT%: 47.4 % (ref 38.4–76.8)
Platelets: 157 10*3/uL (ref 145–400)
RBC: 4.4 10*6/uL (ref 3.70–5.45)
RDW: 14 % (ref 11.2–14.5)
WBC: 6.3 10*3/uL (ref 3.9–10.3)
lymph#: 2.8 10*3/uL (ref 0.9–3.3)

## 2017-09-04 LAB — COMPREHENSIVE METABOLIC PANEL
ALT: 18 U/L (ref 0–55)
AST: 19 U/L (ref 5–34)
Albumin: 3.8 g/dL (ref 3.5–5.0)
Alkaline Phosphatase: 49 U/L (ref 40–150)
Anion Gap: 8 mEq/L (ref 3–11)
BUN: 15.6 mg/dL (ref 7.0–26.0)
CO2: 26 mEq/L (ref 22–29)
Calcium: 9.5 mg/dL (ref 8.4–10.4)
Chloride: 110 mEq/L — ABNORMAL HIGH (ref 98–109)
Creatinine: 1 mg/dL (ref 0.6–1.1)
EGFR: 73 mL/min/{1.73_m2} — ABNORMAL LOW (ref >90)
Glucose: 197 mg/dl — ABNORMAL HIGH (ref 70–140)
Potassium: 4.1 mEq/L (ref 3.5–5.1)
Sodium: 144 mEq/L (ref 136–145)
Total Bilirubin: 0.49 mg/dL (ref 0.20–1.20)
Total Protein: 6.8 g/dL (ref 6.4–8.3)

## 2017-09-04 NOTE — Progress Notes (Signed)
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   PRIOR THERAPY:  CHOEP q 21 days started on 05/20/2013.  Completed 6 of 6 planned cycles for CHOEP on 09/05/2013.  Cycle #5 was complicated by ICU-level care secondary to severe hyperglycemia (see prior note for details).  Cycle #6, prednisone was excluded.  She received neulasta throughout her therapy.   She underwent autologous SCT on February 4th 2015 at [facility] (D. [place]).  She continues to be in remission since that time.  CURRENT THERAPY:  Active surveillance.  INTERVAL HISTORY: Megan Mcknight presents today for a follow-up visit. Since the last visit, she reports no changes in her health. She continues to be active attending to her activities of daily living. She does not report any fevers, chills or arthralgias. She denied any lymphadenopathy or decline in her performance status or activity level. She denied any abdominal pain or discomfort. She did have a colonoscopy in April 2018.    She does not report any headaches, blurry vision, syncope or seizures. She denies any nausea/vomiting/fever or chills. He denies any chest pain, palpitations, worsening fatigue. She denies any pain. She denied cough. She denied a mucositis or night sweats. She does not report any cough, wheezing or hemoptysis. She does not report any constipation, diarrhea or hematochezia. She does not report any frequency urgency or hesitancy. The remaining review of systems unremarkable.  MEDICAL HISTORY: This was reviewed and unchanged at this time. Past Medical History:  Diagnosis Date  . Acute bronchitis   . Anaplastic large cell lymphoma   . Asthma   . Cancer ([place])    lung and kidney  . Colon polyps 02/2013   Per colonoscopy  . Diabetes mellitus without  complication ([place])   . Diverticulosis 02/2013   Per colonoscopy  . Dysrhythmia   . Former light tobacco smoker   . GERD (gastroesophageal reflux disease)   . H/O stem cell transplant ([place]) 2015  . Hyperlipidemia   . Nerve pain    Left leg  . Seasonal allergies     ALLERGIES:  is allergic to glucosamine; iodine; and shellfish-derived products.  MEDICATIONS: has a current medication list which includes the following prescription(s): albuterol, amlodipine, docusate sodium, insulin glargine, lansoprazole, losartan, metformin, metoprolol tartrate, montelukast, and rosuvastatin.    PHYSICAL EXAMINATION: ECOG PERFORMANCE STATUS: 0 - Asymptomatic Blood pressure (!) 141/77, pulse 83, temperature 98.7 F (37.1 C), temperature source Oral, resp. rate 18, height _0  (1.727 m), weight 267 lb (121.1 kg), SpO2 98 %.    GENERAL: Well-appearing woman without distress. SKIN: No oral thrush or ulcers. EYES: normal, sclera clear nonicteric. OROPHARYNX:. No thrush, ulcers or lesions.  NECK: supple, thyroid normal size, non-tender.  LYMPH:  No adenopathy palpated. LUNGS: No rhonchi, wheezes or dullness to percussion. HEART: regular rate & rhythm and no murmurs and no lower extremity edema ABDOMEN: Soft, nontender without rebound or guarding. Good bowel sounds. Musculoskeletal:no cyanosis of digits and no clubbing;  NEURO: No deficits. Motor, sensory are deep tendon reflexes.   Labs:  Lab Results  Component Value Date   WBC 6.3 09/04/2017   HGB 12.0 09/04/2017   HCT 37.0 09/04/2017   MCV 84.0 09/04/2017   PLT 157 09/04/2017   NEUTROABS 3.0 09/04/2017      Chemistry      Component  Value Date/Time   NA 145 02/27/2017 0905   K 3.8 02/27/2017 0905   CL 106 07/26/2014 1440   CO2 28 02/27/2017 0905   BUN 13.3 02/27/2017 0905   CREATININE 1.0 02/27/2017 0905      Component Value Date/Time   CALCIUM 10.1 02/27/2017 0905   ALKPHOS 46 02/27/2017 0905   AST 20 02/27/2017 0905   ALT 19  02/27/2017 0905   BILITOT 0.33 02/27/2017 0905       ASSESSMENT AND PLAN:    56 year old woman with the following issues:    1. 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  She received chemotherapy outlined above initially and subsequently  underwent high dose BEAM autologous SCT on 01/18/2014 (day #0). She achieved complete response and continues to be in remission.  Her physical examination and laboratory data today do not indicate any evidence to suggest recurrent disease. We will continue active surveillance on an annual basis. She follows at [facility] every March and she will follow-up here every September.   2. Age-appropriate cancer screening: She is up-to-date with her mammography and colonoscopy.  3. Port-A-Cath management: This has been removed in March 2017.  4. Vaccinations: She is up-to-date at this time.  5. Follow-up: Will be in 12 months.    Zola Button MD 09/04/17

## 2017-09-04 NOTE — Telephone Encounter (Signed)
Gave avs and calendar for September 2019 °

## 2017-10-28 ENCOUNTER — Other Ambulatory Visit (HOSPITAL_COMMUNITY): Payer: Self-pay | Admitting: Internal Medicine

## 2017-10-28 DIAGNOSIS — Z1231 Encounter for screening mammogram for malignant neoplasm of breast: Secondary | ICD-10-CM

## 2018-01-25 ENCOUNTER — Ambulatory Visit (HOSPITAL_COMMUNITY): Payer: BLUE CROSS/BLUE SHIELD

## 2018-04-16 ENCOUNTER — Other Ambulatory Visit (HOSPITAL_COMMUNITY): Payer: Self-pay | Admitting: Internal Medicine

## 2018-04-16 DIAGNOSIS — Z1231 Encounter for screening mammogram for malignant neoplasm of breast: Secondary | ICD-10-CM

## 2018-04-19 ENCOUNTER — Ambulatory Visit (HOSPITAL_COMMUNITY): Payer: BLUE CROSS/BLUE SHIELD

## 2018-05-17 ENCOUNTER — Ambulatory Visit (HOSPITAL_COMMUNITY): Payer: BLUE CROSS/BLUE SHIELD

## 2018-06-16 ENCOUNTER — Emergency Department (HOSPITAL_COMMUNITY)
Admission: EM | Admit: 2018-06-16 | Discharge: 2018-06-16 | Disposition: A | Discharge status: Home / Self Care | Payer: BLUE CROSS/BLUE SHIELD | Attending: Emergency Medicine | Admitting: Emergency Medicine

## 2018-06-16 ENCOUNTER — Other Ambulatory Visit: Payer: Self-pay

## 2018-06-16 ENCOUNTER — Emergency Department (HOSPITAL_COMMUNITY): Payer: BLUE CROSS/BLUE SHIELD

## 2018-06-16 ENCOUNTER — Encounter (HOSPITAL_COMMUNITY): Payer: Self-pay | Admitting: Emergency Medicine

## 2018-06-16 DIAGNOSIS — Z85528 Personal history of other malignant neoplasm of kidney: Secondary | ICD-10-CM | POA: Insufficient documentation

## 2018-06-16 DIAGNOSIS — E119 Type 2 diabetes mellitus without complications: Secondary | ICD-10-CM | POA: Diagnosis not present

## 2018-06-16 DIAGNOSIS — M7918 Myalgia, other site: Secondary | ICD-10-CM | POA: Diagnosis not present

## 2018-06-16 DIAGNOSIS — Z794 Long term (current) use of insulin: Secondary | ICD-10-CM | POA: Insufficient documentation

## 2018-06-16 DIAGNOSIS — J4521 Mild intermittent asthma with (acute) exacerbation: Secondary | ICD-10-CM | POA: Diagnosis not present

## 2018-06-16 DIAGNOSIS — Z87891 Personal history of nicotine dependence: Secondary | ICD-10-CM | POA: Diagnosis not present

## 2018-06-16 DIAGNOSIS — Z85118 Personal history of other malignant neoplasm of bronchus and lung: Secondary | ICD-10-CM | POA: Insufficient documentation

## 2018-06-16 DIAGNOSIS — Z8572 Personal history of non-Hodgkin lymphomas: Secondary | ICD-10-CM | POA: Insufficient documentation

## 2018-06-16 DIAGNOSIS — R05 Cough: Secondary | ICD-10-CM | POA: Diagnosis present

## 2018-06-16 DIAGNOSIS — Z79899 Other long term (current) drug therapy: Secondary | ICD-10-CM | POA: Diagnosis not present

## 2018-06-16 HISTORY — DX: Unspecified coma: R40.20

## 2018-06-16 HISTORY — DX: Non-Hodgkin lymphoma, unspecified, unspecified site: C85.90

## 2018-06-16 LAB — URINALYSIS, ROUTINE W REFLEX MICROSCOPIC
Bilirubin Urine: NEGATIVE
Glucose, UA: NEGATIVE mg/dL
Ketones, ur: NEGATIVE mg/dL
Leukocytes, UA: NEGATIVE
Nitrite: NEGATIVE
Protein, ur: 30 mg/dL — AB
Specific Gravity, Urine: 1.017 (ref 1.005–1.030)
pH: 6 (ref 5.0–8.0)

## 2018-06-16 LAB — BASIC METABOLIC PANEL
Anion gap: 8 (ref 5–15)
BUN: 12 mg/dL (ref 6–20)
CO2: 28 mmol/L (ref 22–32)
Calcium: 9.3 mg/dL (ref 8.9–10.3)
Chloride: 106 mmol/L (ref 98–111)
Creatinine, Ser: 0.94 mg/dL (ref 0.44–1.00)
GFR calc Af Amer: >60 mL/min (ref >60)
GFR calc non Af Amer: >60 mL/min (ref >60)
Glucose, Bld: 78 mg/dL (ref 70–99)
Potassium: 3.5 mmol/L (ref 3.5–5.1)
Sodium: 142 mmol/L (ref 135–145)

## 2018-06-16 LAB — CBC
HCT: 38.4 % (ref 36.0–46.0)
Hemoglobin: 12 g/dL (ref 12.0–15.0)
MCH: 27.3 pg (ref 26.0–34.0)
MCHC: 31.3 g/dL (ref 30.0–36.0)
MCV: 87.3 fL (ref 78.0–100.0)
Platelets: 156 10*3/uL (ref 150–400)
RBC: 4.4 MIL/uL (ref 3.87–5.11)
RDW: 14.3 % (ref 11.5–15.5)
WBC: 4.4 10*3/uL (ref 4.0–10.5)

## 2018-06-16 LAB — D-DIMER, QUANTITATIVE: D-Dimer, Quant: 0.44 ug/mL-FEU (ref 0.00–0.50)

## 2018-06-16 LAB — TROPONIN I: Troponin I: <0.03 ng/mL (ref <0.03)

## 2018-06-16 MED ORDER — ALBUTEROL SULFATE (2.5 MG/3ML) 0.083% IN NEBU
2.5000 mg | INHALATION_SOLUTION | Freq: Once | RESPIRATORY_TRACT | Status: AC
  Administered 2018-06-16: 2.5 mg via RESPIRATORY_TRACT
  Filled 2018-06-16: qty 3

## 2018-06-16 MED ORDER — IPRATROPIUM-ALBUTEROL 0.5-2.5 (3) MG/3ML IN SOLN
3.0000 mL | Freq: Once | RESPIRATORY_TRACT | Status: AC
  Administered 2018-06-16: 3 mL via RESPIRATORY_TRACT
  Filled 2018-06-16: qty 3

## 2018-06-16 MED ORDER — DOXYCYCLINE HYCLATE 100 MG PO TABS: 100.0000 mg | ORAL_TABLET | Freq: Two times a day (BID) | ORAL | 0 refills | Status: DC

## 2018-06-16 MED ORDER — PREDNISONE 50 MG PO TABS
60.0000 mg | ORAL_TABLET | Freq: Once | ORAL | Status: AC
  Administered 2018-06-16: 60 mg via ORAL
  Filled 2018-06-16: qty 1

## 2018-06-16 MED ORDER — PREDNISONE 20 MG PO TABS: 40.0000 mg | ORAL_TABLET | Freq: Every day | ORAL | 0 refills | Status: DC

## 2018-06-16 NOTE — ED Triage Notes (Signed)
Pt reports having a productive cough with green sputum since Sunday. Pt states her chest and back hurt. Has used nebulizer and inhalers at home and OTC medications.

## 2018-06-16 NOTE — Discharge Instructions (Addendum)
Take the prescriptions as directed.  Use your albuterol inhaler (2 to 4 puffs) every 4 hours for the next 7 days, then as needed for cough, wheezing, or shortness of breath.  Apply moist heat or ice to the area(s) of discomfort, for 15 minutes at a time, several times per day for the next few days.  Do not fall asleep on a heating or ice pack.  Call your regular medical doctor on Friday to schedule a follow up appointment in the next 3 days.  Return to the Emergency Department immediately if worsening.

## 2018-06-16 NOTE — ED Provider Notes (Signed)
[facility] EMERGENCY DEPARTMENT Provider Note   CSN: [id number] Arrival date & time: 06/16/18  1643     History   Chief Complaint Chief Complaint  Patient presents with  . Chest Pain    HPI Megan Mcknight is a 57 y.o. female.  HPI  Pt was seen at 1755.  Per pt, c/o gradual onset and persistence of constant cough for the past 3 days. Cough has been productive of "green" sputum. Has been associated with bilat lower back pain and generalized anterior chest pain. Describes the CP as constant and "tight" for the past 3 days, worsens with coughing and breathing. LBP worsens with palpation of the area and body position changes. Has been using home MDI with transient relief.  Denies palpitations, no flank pain, no abd pain, no N/V/D, no fevers, no rash. Denies incont/retention of bowel or bladder, no saddle anesthesia, no focal motor weakness, no tingling/numbness in extremities, no fevers, no injury.     Past Medical History:  Diagnosis Date  . Acute bronchitis   . Anaplastic large cell lymphoma   . Asthma   . Cancer ([place])    lung and kidney  . Colon polyps 02/2013   Per colonoscopy  . Coma ([place])   . Diabetes mellitus without complication ([place])   . Diverticulosis 02/2013   Per colonoscopy  . Dysrhythmia   . Former light tobacco smoker   . GERD (gastroesophageal reflux disease)   . H/O stem cell transplant ([place]) 2015  . Hyperlipidemia   . Nerve pain    Left leg  . Non Hodgkin's lymphoma ([place])   . Seasonal allergies     Patient Active Problem List   Diagnosis Date Noted  . PTTD (posterior tibial tendon dysfunction) 08/21/2015  . Transaminitis 05/31/2014  . Inguinal adenopathy 10/20/2013  . Breast mass, left 10/20/2013  . Neuropathy due to chemotherapeutic drug ([place]) 09/29/2013  . Tumor lysis syndrome 08/21/2013  . Hyperglycemia without ketosis 08/21/2013  . Acute renal failure ([place]) 08/21/2013  . Acute encephalopathy 08/21/2013  . Hyperkalemia 08/21/2013  . Lymphoma  ([place]) 05/19/2013  . Shortness of breath 05/06/2013  . Atrial fibrillation with RVR ([place]) 05/06/2013  . Bilateral pneumonia 05/06/2013  . Acute renal insufficiency 05/06/2013  . Normocytic anemia 05/06/2013  . GERD (gastroesophageal reflux disease) 02/02/2012    Past Surgical History:  Procedure Laterality Date  . bones removed from Baby toes    . COLONOSCOPY  02/16/2012   Procedure: COLONOSCOPY;  Surgeon: Daneil Dolin, MD;  [facility];  Service: Endoscopy;  Laterality: N/A;  1:30  . COLONOSCOPY N/A 03/26/2017   Procedure: COLONOSCOPY;  Surgeon: Daneil Dolin, MD;  Location[facility];  Service: Endoscopy;  Laterality: N/A;  930  . MEDIASTINOSCOPY N/A 05/13/2013   Procedure: MEDIASTINOSCOPY;  Surgeon: Melrose Nakayama, MD;  Location: [facility];  Service: Thoracic;  Laterality: N/A;  . PARTIAL HYSTERECTOMY    . VAGINAL HYSTERECTOMY     partial hyst   . VIDEO BRONCHOSCOPY WITH ENDOBRONCHIAL ULTRASOUND N/A 05/13/2013   Procedure: VIDEO BRONCHOSCOPY WITH ENDOBRONCHIAL ULTRASOUND;  Surgeon: Melrose Nakayama, MD;  Location: [facility];  Service: Thoracic;  Laterality: N/A;     OB History   None      Home Medications    Prior to Admission medications   Medication Sig Start Date End Date Taking? Authorizing Provider  albuterol (PROVENTIL HFA;VENTOLIN HFA) 108 (90 Base) MCG/ACT inhaler Inhale 2 puffs into the lungs every 4 (four) hours  as needed for wheezing or shortness of breath. 10/27/16  Yes Pollina, Gwenyth Allegra, MD  amLODipine (NORVASC) 5 MG tablet Take 5 mg by mouth daily.   Yes [provider]  docusate sodium (COLACE) 100 MG capsule Take 100 mg by mouth daily as needed.  11/24/13  Yes [provider]  insulin glargine (LANTUS) 100 UNIT/ML injection Inject 20 Units into the skin daily as needed.    Yes [provider]  lansoprazole (PREVACID) 15 MG capsule Take 15 mg by mouth as needed.   Yes [provider]  metFORMIN  (GLUCOPHAGE) 500 MG tablet Take by mouth 2 (two) times daily with a meal.   Yes [provider]  metoprolol tartrate (LOPRESSOR) 25 MG tablet Take 25 mg by mouth 2 (two) times daily.    Yes [provider]  montelukast (SINGULAIR) 10 MG tablet Take 10 mg by mouth at bedtime.   Yes [provider]  rosuvastatin (CRESTOR) 10 MG tablet Take 10 mg by mouth daily.   Yes [provider]    Family History Family History  Problem Relation Age of Onset  . Colon polyps Mother   . Hypertension Mother   . Heart disease Mother   . Cancer Father        prostate  . Hypertension Father   . Heart defect Unknown        family history   . Asthma Unknown        family history     Social History Social History   Tobacco Use  . Smoking status: Former Smoker    Packs/day: 0.50    Types: Cigarettes    Last attempt to quit: 04/19/2013    Years since quitting: 5.1  . Smokeless tobacco: Never Used  Substance Use Topics  . Alcohol use: No    Comment: one glass every three weeks  . Drug use: No     Allergies   Glucosamine; Iodine; and Shellfish-derived products   Review of Systems Review of Systems ROS: Statement: All systems negative except as marked or noted in the HPI; Constitutional: Negative for fever and chills. ; ; Eyes: Negative for eye pain, redness and discharge. ; ; ENMT: Negative for ear pain, hoarseness, nasal congestion, sinus pressure and sore throat. ; ; Cardiovascular: Negative for palpitations, diaphoresis, dyspnea and peripheral edema. ; ; Respiratory: +cough, CP. Negative for wheezing and stridor. ; ; Gastrointestinal: Negative for nausea, vomiting, diarrhea, abdominal pain, blood in stool, hematemesis, jaundice and rectal bleeding. . ; ; Genitourinary: Negative for dysuria, flank pain and hematuria. ; ; Musculoskeletal: +LBP. Negative for neck pain. Negative for swelling and trauma.; ; Skin: Negative for pruritus, rash, abrasions, blisters,  bruising and skin lesion.; ; Neuro: Negative for headache, lightheadedness and neck stiffness. Negative for weakness, altered level of consciousness, altered mental status, extremity weakness, paresthesias, involuntary movement, seizure and syncope.       Physical Exam Updated Vital Signs BP 132/83 (BP Location: Right Arm)   Pulse 93   Temp 99 F (37.2 C) (Temporal)   Resp 14   Ht 5\' 8"  (1.727 m)   Wt 113.4 kg (250 lb)   SpO2 99%   BMI 38.01 kg/m    BP (!) 143/78   Pulse 97   Temp 99 F (37.2 C) (Temporal)   Resp 20   Ht 5\' 8"  (1.727 m)   Wt 113.4 kg (250 lb)   SpO2 96%   BMI 38.01 kg/m   Physical Exam 1800:  Physical examination:  Nursing notes reviewed; Vital signs and O2 SAT reviewed;  Constitutional: Well developed, Well nourished, Well hydrated, In no acute distress; Head:  Normocephalic, atraumatic; Eyes: EOMI, PERRL, No scleral icterus; ENMT: Mouth and pharynx normal, Mucous membranes moist; Neck: Supple, Full range of motion, No lymphadenopathy; Cardiovascular: Regular rate and rhythm, No gallop; Respiratory: Breath sounds diminished & equal bilaterally, No wheezes.  Speaking full sentences with ease, Normal respiratory effort/excursion; Chest: Nontender, Movement normal; Abdomen: Soft, Nontender, Nondistended, Normal bowel sounds; Spine:  No midline CS, TS, LS tenderness. +TTP bilat lower lumbar paraspinal muscles. No rash.;; Genitourinary: No CVA tenderness; Extremities: Peripheral pulses normal, No tenderness, No edema, No calf edema or asymmetry.; Neuro: AA&Ox3, Major CN grossly intact.  Speech clear. No gross focal motor or sensory deficits in extremities.; Skin: Color normal, Warm, Dry.   ED Treatments / Results  Labs (all labs ordered are listed, but only abnormal results are displayed)   EKG EKG Interpretation  Date/Time:  Wednesday June 16 2018 16:56:13 EDT Ventricular Rate:  90 PR Interval:  154 QRS Duration: 88 QT Interval:  360 QTC  Calculation: 440 R Axis:   -48 Text Interpretation:  Normal sinus rhythm Left anterior fascicular block Left axis deviation When compared with ECG of 05/13/2013 Rate slower Otherwise no significant change Confirmed by Francine Graven [phone]) on 06/16/2018 6:05:43 PM   Radiology   Procedures Procedures (including critical care time)  Medications Ordered in ED Medications  albuterol (PROVENTIL) (2.5 MG/3ML) 0.083% nebulizer solution 2.5 mg (has no administration in time range)  ipratropium-albuterol (DUONEB) 0.5-2.5 (3) MG/3ML nebulizer solution 3 mL (has no administration in time range)  predniSONE (DELTASONE) tablet 60 mg (has no administration in time range)     Initial Impression / Assessment and Plan / ED Course  I have reviewed the triage vital signs and the nursing notes.  Pertinent labs & imaging results that were available during my care of the patient were reviewed by me and considered in my medical decision making (see chart for details).  MDM Reviewed: previous chart, nursing note and vitals Reviewed previous: labs and ECG Interpretation: labs, ECG and x-ray   Results for orders placed or performed during the hospital encounter of 60/10/93  Basic metabolic panel  Result Value Ref Range   Sodium 142 135 - 145 mmol/L   Potassium 3.5 3.5 - 5.1 mmol/L   Chloride 106 98 - 111 mmol/L   CO2 28 22 - 32 mmol/L   Glucose, Bld 78 70 - 99 mg/dL   BUN 12 6 - 20 mg/dL   Creatinine, Ser 0.94 0.44 - 1.00 mg/dL   Calcium 9.3 8.9 - 10.3 mg/dL   GFR calc non Af Amer >60 >60 mL/min   GFR calc Af Amer >60 >60 mL/min   Anion gap 8 5 - 15  CBC  Result Value Ref Range   WBC 4.4 4.0 - 10.5 K/uL   RBC 4.40 3.87 - 5.11 MIL/uL   Hemoglobin 12.0 12.0 - 15.0 g/dL   HCT 38.4 36.0 - 46.0 %   MCV 87.3 78.0 - 100.0 fL   MCH 27.3 26.0 - 34.0 pg   MCHC 31.3 30.0 - 36.0 g/dL   RDW 14.3 11.5 - 15.5 %   Platelets 156 150 - 400 K/uL  Troponin I  Result Value Ref Range   Troponin I <0.03  <0.03 ng/mL  Urinalysis, Routine w reflex microscopic  Result Value Ref Range   Color, Urine YELLOW YELLOW   APPearance CLEAR  CLEAR   Specific Gravity, Urine 1.017 1.005 - 1.030   pH 6.0 5.0 - 8.0   Glucose, UA NEGATIVE NEGATIVE mg/dL   Hgb urine dipstick MODERATE (A) NEGATIVE   Bilirubin Urine NEGATIVE NEGATIVE   Ketones, ur NEGATIVE NEGATIVE mg/dL   Protein, ur 30 (A) NEGATIVE mg/dL   Nitrite NEGATIVE NEGATIVE   Leukocytes, UA NEGATIVE NEGATIVE   RBC / HPF 21-50 0 - 5 RBC/hpf   WBC, UA 0-5 0 - 5 WBC/hpf   Bacteria, UA RARE (A) NONE SEEN   Squamous Epithelial / LPF 0-5 0 - 5   Mucus PRESENT   D-dimer, quantitative  Result Value Ref Range   D-Dimer, Quant 0.44 0.00 - 0.50 ug/mL-FEU   Dg Chest 2 View Result Date: 06/16/2018 CLINICAL DATA:  Productive cough for 3 days. EXAM: CHEST - 2 VIEW COMPARISON:  10/26/2016 and prior exams FINDINGS: Mild cardiomegaly and peribronchial thickening again noted. There is no evidence of focal airspace disease, pulmonary edema, suspicious pulmonary nodule/mass, pleural effusion, or pneumothorax. No acute bony abnormalities are identified. IMPRESSION: Mild cardiomegaly without evidence of acute cardiopulmonary disease. Mild chronic peribronchial thickening. Electronically Signed   By: Margarette Canada M.D.   On: 06/16/2018 17:09   Dg Lumbar Spine Complete Result Date: 06/16/2018 CLINICAL DATA:  Acute low back pain. EXAM: LUMBAR SPINE - COMPLETE 4+ VIEW COMPARISON:  06/14/2015 abdominal/pelvic CT FINDINGS: There is no evidence of lumbar spine fracture. Alignment is normal. Intervertebral disc spaces are maintained. IMPRESSION: Negative. Electronically Signed   By: Margarette Canada M.D.   On: 06/16/2018 19:49    2010:  Pt states she "feels better" after neb and steroid.  NAD, lungs CTA bilat, no wheezing, resps easy, speaking full sentences, Sats 100% R/A.  Pt ambulated around the ED with Sats remaining 99 % R/A, resps easy, NAD.  Wants to go home now. Workup  reassuring. Doubt PE as cause for symptoms with normal d-dimer and low risk [place]. Doubt TAA/AAA as cause for symptoms, as pt's multiple previous CT C/A/P scans without aortic dilatation. Doubt ACS as cause for symptoms with normal troponin and unchanged EKG from previous after 3 days of constant symptoms.  Tx symptomatically at this time. Dx and testing d/w pt.  Questions answered.  Verb understanding, agreeable to d/c home with outpt f/u.     Final Clinical Impressions(s) / ED Diagnoses   Final diagnoses:  None    ED Discharge Orders    None       Francine Graven, DO 06/20/18 [id number]

## 2018-06-16 NOTE — ED Notes (Signed)
Patient transported to X-ray 

## 2018-09-03 ENCOUNTER — Telehealth: Payer: Self-pay

## 2018-09-03 ENCOUNTER — Inpatient Hospital Stay: Payer: BLUE CROSS/BLUE SHIELD

## 2018-09-03 ENCOUNTER — Inpatient Hospital Stay: Payer: BLUE CROSS/BLUE SHIELD | Attending: Oncology | Admitting: Oncology

## 2018-09-03 VITALS — BP 128/79 | HR 68 | Temp 98.5°F | Resp 17 | Ht 68.0 in | Wt 245.1 lb

## 2018-09-03 DIAGNOSIS — C8460 Anaplastic large cell lymphoma, ALK-positive, unspecified site: Secondary | ICD-10-CM | POA: Diagnosis not present

## 2018-09-03 DIAGNOSIS — Z79899 Other long term (current) drug therapy: Secondary | ICD-10-CM | POA: Diagnosis not present

## 2018-09-03 DIAGNOSIS — Z794 Long term (current) use of insulin: Secondary | ICD-10-CM | POA: Insufficient documentation

## 2018-09-03 LAB — COMPREHENSIVE METABOLIC PANEL
ALT: 13 U/L (ref 0–44)
AST: 17 U/L (ref 15–41)
Albumin: 3.8 g/dL (ref 3.5–5.0)
Alkaline Phosphatase: 55 U/L (ref 38–126)
Anion gap: 8 (ref 5–15)
BUN: 12 mg/dL (ref 6–20)
CO2: 29 mmol/L (ref 22–32)
Calcium: 9.5 mg/dL (ref 8.9–10.3)
Chloride: 110 mmol/L (ref 98–111)
Creatinine, Ser: 0.93 mg/dL (ref 0.44–1.00)
GFR calc Af Amer: >60 mL/min (ref >60)
GFR calc non Af Amer: >60 mL/min (ref >60)
Glucose, Bld: 94 mg/dL (ref 70–99)
Potassium: 3.8 mmol/L (ref 3.5–5.1)
Sodium: 147 mmol/L — ABNORMAL HIGH (ref 135–145)
Total Bilirubin: 0.5 mg/dL (ref 0.3–1.2)
Total Protein: 7 g/dL (ref 6.5–8.1)

## 2018-09-03 LAB — CBC WITH DIFFERENTIAL/PLATELET
Basophils Absolute: 0 10*3/uL (ref 0.0–0.1)
Basophils Relative: 0 %
Eosinophils Absolute: 0.1 10*3/uL (ref 0.0–0.5)
Eosinophils Relative: 2 %
HCT: 38.2 % (ref 34.8–46.6)
Hemoglobin: 12.1 g/dL (ref 11.6–15.9)
Lymphocytes Relative: 39 %
Lymphs Abs: 2.9 10*3/uL (ref 0.9–3.3)
MCH: 27.3 pg (ref 25.1–34.0)
MCHC: 31.7 g/dL (ref 31.5–36.0)
MCV: 86.2 fL (ref 79.5–101.0)
Monocytes Absolute: 0.4 10*3/uL (ref 0.1–0.9)
Monocytes Relative: 5 %
Neutro Abs: 4 10*3/uL (ref 1.5–6.5)
Neutrophils Relative %: 54 %
Platelets: 151 10*3/uL (ref 145–400)
RBC: 4.43 MIL/uL (ref 3.70–5.45)
RDW: 14.1 % (ref 11.2–14.5)
WBC: 7.5 10*3/uL (ref 3.9–10.3)

## 2018-09-03 NOTE — Telephone Encounter (Signed)
Printed avs and calender of upcoming appointment. Per 9/20 los 

## 2018-09-03 NOTE — Progress Notes (Signed)
[place] ONCOLOGY OFFICE PROGRESS NOTE 09/03/18   Rosita Fire, MD [address] [place] [address]  DIAGNOSIS: 57 year old woman with anaplastic large cell lymphoma diagnosed in 2014.  Her tumor was CD30 and ALK positive, CD20 negative.  She remains in remission at this time.   PRIOR THERAPY:  CHOEP q 21 days started on 05/20/2013.  Completed 6 of 6 planned cycles for CHOEP on 09/05/2013.  Cycle #5 was complicated by ICU-level care secondary to severe hyperglycemia (see prior note for details).  Cycle #6, prednisone was excluded.  She received neulasta throughout her therapy.   She underwent autologous SCT on February 4th 2015 at [facility] (D. [place]).  She continues to be in remission since that time.  CURRENT THERAPY:  Active surveillance.  INTERVAL HISTORY: Ms. Runions is here for a follow-up visit.  Since her last visit, she reports no major changes in her health.  She was seen in the emergency department by of 2019 for an asthma attack but has resolved since that time.  She remains active continues to exercise regularly.  She has intentionally lost 20 pounds since the last visit in a year.  She denies any lymphadenopathy or constitutional symptoms.  She denies any excessive fatigue or tiredness.  Her performance status and quality of life remain excellent.    She does not report any headaches, blurry vision, syncope or seizures.  She denies any alteration mental status or confusion.  She denies any fevers or chills or sweats. He denies any chest pain, palpitations, worsening fatigue. She denied cough, wheezing or hemoptysis. She does not report any constipation, diarrhea or hematochezia. She does not report any frequency urgency or hesitancy.  She does not report any arthralgias or myalgias.  She denies any skin rashes or lesions.  The remaining review of systems is negative.   ALLERGIES:  is allergic to glucosamine; iodine; and shellfish-derived  products.  MEDICATIONS: has a current medication list which includes the following prescription(s): albuterol, amlodipine, docusate sodium, doxycycline, insulin glargine, lansoprazole, metformin, metoprolol tartrate, montelukast, prednisone, and rosuvastatin.    PHYSICAL EXAMINATION: Blood pressure 128/79, pulse 68, temperature 98.5 F (36.9 C), temperature source Oral, resp. rate 17, height 5' 8" (1.727 m), weight 245 lb 1.6 oz (111.2 kg), SpO2 99 %.   ECOG 0  General appearance: Comfortable appearing without any discomfort Head: Normocephalic without any trauma Oropharynx: Mucous membranes are moist and pink without any thrush or ulcers. Eyes: Pupils are equal and round reactive to light. Lymph nodes: No cervical, supraclavicular, inguinal or axillary lymphadenopathy.   Heart:regular rate and rhythm.  S1 and S2 without leg edema. Lung: Clear without any rhonchi or wheezes.  No dullness to percussion. Abdomin: Soft, nontender, nondistended with good bowel sounds.  No hepatosplenomegaly. Musculoskeletal: No joint deformity or effusion.  Full range of motion noted. Neurological: No deficits noted on motor, sensory and deep tendon reflex exam. Skin: No petechial rash or dryness.  Appeared moist.  Psychiatric: Mood and affect appeared appropriate.     Labs:  Lab Results  Component Value Date   WBC 4.4 06/16/2018   HGB 12.0 06/16/2018   HCT 38.4 06/16/2018   MCV 87.3 06/16/2018   PLT 156 06/16/2018   NEUTROABS 3.0 09/04/2017      Chemistry      Component Value Date/Time   NA 142 06/16/2018 1821   NA 144 09/04/2017 0916   K 3.5 06/16/2018 1821   K 4.1 09/04/2017 0916   CL 106  06/16/2018 1821   CO2 28 06/16/2018 1821   CO2 26 09/04/2017 0916   BUN 12 06/16/2018 1821   BUN 15.6 09/04/2017 0916   CREATININE 0.94 06/16/2018 1821   CREATININE 1.0 09/04/2017 0916      Component Value Date/Time   CALCIUM 9.3 06/16/2018 1821   CALCIUM 9.5 09/04/2017 0916   ALKPHOS 49  09/04/2017 0916   AST 19 09/04/2017 0916   ALT 18 09/04/2017 0916   BILITOT 0.49 09/04/2017 0916       ASSESSMENT AND PLAN:    57 year old woman with the following issues:    1.  Anaplastic large cell lymphoma diagnosed in 2014.  She was found to have stage IV disease at that time and remains in remission after aggressive therapy.  Her therapy as outlined above concluded at [facility].   She continues to follow-up periodically at [facility] without any evidence of recurrent disease.  Her laboratory data and physical exam do not suggest any disease relapse at this time.  Her CBC is normal without any evidence of other hematological condition at this time.   2. Age-appropriate cancer screening: He is overdue on her mammography which was completed in 2018 but not in 2019.  I urged her to do so and she will make an appointment in the immediate future.  3. Vaccinations: She remains up-to-date at this time without any recent infections.  4. Follow-up: Will be in 12 months.  15  minutes was spent with the patient face-to-face today.  More than 50% of time was dedicated to patient counseling, education and discussing the natural course of her disease and treatment options.    Zola Button MD 09/03/18

## 2018-09-06 ENCOUNTER — Ambulatory Visit (HOSPITAL_COMMUNITY): Payer: BLUE CROSS/BLUE SHIELD

## 2018-09-13 ENCOUNTER — Ambulatory Visit (HOSPITAL_COMMUNITY): Payer: BLUE CROSS/BLUE SHIELD

## 2018-09-20 ENCOUNTER — Ambulatory Visit (HOSPITAL_COMMUNITY)
Admission: RE | Admit: 2018-09-20 | Discharge: 2018-09-20 | Disposition: A | Discharge status: Home / Self Care | Payer: BLUE CROSS/BLUE SHIELD | Source: Ambulatory Visit | Attending: Internal Medicine | Admitting: Internal Medicine

## 2018-09-20 DIAGNOSIS — Z1231 Encounter for screening mammogram for malignant neoplasm of breast: Secondary | ICD-10-CM | POA: Diagnosis not present

## 2019-02-17 ENCOUNTER — Encounter: Payer: Self-pay | Admitting: Advanced Practice Midwife

## 2019-02-17 ENCOUNTER — Ambulatory Visit: Payer: BLUE CROSS/BLUE SHIELD | Admitting: Advanced Practice Midwife

## 2019-02-17 VITALS — BP 122/75 | HR 69 | Ht 68.0 in | Wt 253.0 lb

## 2019-02-17 DIAGNOSIS — A599 Trichomoniasis, unspecified: Secondary | ICD-10-CM

## 2019-02-17 DIAGNOSIS — Z113 Encounter for screening for infections with a predominantly sexual mode of transmission: Secondary | ICD-10-CM | POA: Diagnosis not present

## 2019-02-17 MED ORDER — METRONIDAZOLE 500 MG PO TABS: 500.0000 mg | ORAL_TABLET | Freq: Two times a day (BID) | ORAL | 0 refills | Status: AC

## 2019-02-17 NOTE — Patient Instructions (Signed)
Trichomoniasis Trichomoniasis is an STI (sexually transmitted infection) that can affect both women and men. In women, the outer area of the female genitalia (vulva) and the vagina are affected. In men, the penis is mainly affected, but the prostate and other reproductive organs can also be involved. This condition can be treated with medicine. It often has no symptoms (is asymptomatic), especially in men. What are the causes? This condition is caused by an organism called Trichomonas vaginalis. Trichomoniasis most often spreads from person to person (is contagious) through sexual contact. What increases the risk? The following factors may make you more likely to develop this condition:  Having unprotected sexual intercourse.  Having sexual intercourse with a partner who has trichomoniasis.  Having multiple sexual partners.  Having had previous trichomoniasis infections or other STIs. What are the signs or symptoms? In women, symptoms of trichomoniasis include:  Abnormal vaginal discharge that is clear, white, gray, or yellow-green and foamy and has an unusual "fishy" odor.  Itching and irritation of the vagina and vulva.  Burning or pain during urination or sexual intercourse.  Genital redness and swelling. In men, symptoms of trichomoniasis include:  Penile discharge that may be foamy or contain pus.  Pain in the penis. This may happen only when urinating.  Itching or irritation inside the penis.  Burning after urination or ejaculation. How is this diagnosed? In women, this condition may be found during a routine Pap test or physical exam. It may be found in men during a routine physical exam. Your health care provider may perform tests to help diagnose this infection, such as:  Urine tests (men and women).  The following in women: ? Testing the pH of the vagina. ? A vaginal swab test that checks for the Trichomonas vaginalis organism. ? Testing vaginal secretions. Your  health care provider may test you for other STIs, including HIV (human immunodeficiency virus). How is this treated? This condition is treated with medicine taken by mouth (orally), such as metronidazole or tinidazole to fight the infection. Your sexual partner(s) may also need to be tested and treated.  If you are a woman and you plan to become pregnant or think you may be pregnant, tell your health care provider right away. Some medicines that are used to treat the infection should not be taken during pregnancy. Your health care provider may recommend over-the-counter medicines or creams to help relieve itching or irritation. You may be tested for infection again 3 months after treatment. Follow these instructions at home:  Take and use over-the-counter and prescription medicines, including creams, only as told by your health care provider.  Do not have sexual intercourse until one week after you finish your medicine, or until your health care provider approves. Ask your health care provider when you may resume sexual intercourse.  (Women) Do not douche or wear tampons while you have the infection.  Discuss your infection with your sexual partner(s). Make sure that your partner gets tested and treated, if necessary.  Keep all follow-up visits as told by your health care provider. This is important. How is this prevented?  Use condoms every time you have sex. Using condoms correctly and consistently can help protect against STIs.  Avoid having multiple sexual partners.  Talk with your sexual partner about any symptoms that either of you may have, as well as any history of STIs.  Get tested for STIs and STDs (sexually transmitted diseases) before you have sex. Ask your partner to do the same.  Do not have sexual contact if you have symptoms of trichomoniasis or another STI. Contact a health care provider if:  You still have symptoms after you finish your medicine.  You develop pain in  your abdomen.  You have pain when you urinate.  You have bleeding after sexual intercourse.  You develop a rash.  You feel nauseous or you vomit.  You plan to become pregnant or think you may be pregnant. Summary  Trichomoniasis is an STI (sexually transmitted infection) that can affect both women and men.  This condition often has no symptoms (is asymptomatic), especially in men.  You should not have sexual intercourse until one week after you finish your medicine, or until your health care provider approves. Ask your health care provider when you may resume sexual intercourse.  Discuss your infection with your sexual partner. Make sure that your partner gets tested and treated, if necessary. This information is not intended to replace advice given to you by your health care provider. Make sure you discuss any questions you have with your health care provider. Document Released: 05/27/2001 Document Revised: 10/24/2016 Document Reviewed: 10/24/2016 Elsevier Interactive Patient Education  2019 [employer].

## 2019-02-17 NOTE — Progress Notes (Signed)
[facility] Visit  Patient name: Megan Mcknight MRN [id number]  Date of birth: 11/19/1961  CC & HPI:  Megan Mcknight is a 58 y.o. African American female presenting today for vaginal discharge.  Has noticed it for a few weeks. no odor +itch/irritation. Used a 3 day monistt tx, helped some.  Has a new sexual partner of 3 months, having unprotected intercourse.  Pertinent History Reviewed:  Medical & Surgical Hx:   Past Medical History:  Diagnosis Date  . Acute bronchitis   . Anaplastic large cell lymphoma 2014   remission since 2015  . Asthma   . Cancer ([place])    lung and kidney  . Colon polyps 02/2013   Per colonoscopy  . Coma ([place])   . Diabetes mellitus without complication ([place])   . Diverticulosis 02/2013   Per colonoscopy  . Dysrhythmia   . Former light tobacco smoker   . GERD (gastroesophageal reflux disease)   . H/O stem cell transplant ([place]) 2015  . Hyperlipidemia   . Nerve pain    Left leg  . Non Hodgkin's lymphoma ([place])   . Seasonal allergies    Past Surgical History:  Procedure Laterality Date  . bones removed from Baby toes    . COLONOSCOPY  02/16/2012   Procedure: COLONOSCOPY;  Surgeon: Daneil Dolin, MD;  [facility];  Service: Endoscopy;  Laterality: N/A;  1:30  . COLONOSCOPY N/A 03/26/2017   Procedure: COLONOSCOPY;  Surgeon: Daneil Dolin, MD;  [facility];  Service: Endoscopy;  Laterality: N/A;  930  . MEDIASTINOSCOPY N/A 05/13/2013   Procedure: MEDIASTINOSCOPY;  Surgeon: Melrose Nakayama, MD;  Location: [facility];  Service: Thoracic;  Laterality: N/A;  . PARTIAL HYSTERECTOMY    . VAGINAL HYSTERECTOMY     partial hyst   . VIDEO BRONCHOSCOPY WITH ENDOBRONCHIAL ULTRASOUND N/A 05/13/2013   Procedure: VIDEO BRONCHOSCOPY WITH ENDOBRONCHIAL ULTRASOUND;  Surgeon: Melrose Nakayama, MD;  Location: [facility] OR;  Service: Thoracic;  Laterality: N/A;   Family History  Problem Relation Age of Onset  . Colon polyps Mother   . Hypertension  Mother   . Heart disease Mother   . Cancer Father        prostate  . Hypertension Father   . Heart defect Other        family history   . Asthma Other        family history     Current Outpatient Medications:  .  albuterol (PROVENTIL HFA;VENTOLIN HFA) 108 (90 Base) MCG/ACT inhaler, Inhale 2 puffs into the lungs every 4 (four) hours as needed for wheezing or shortness of breath., Disp: 1 Inhaler, Rfl: 2 .  amLODipine (NORVASC) 5 MG tablet, Take 5 mg by mouth daily., Disp: , Rfl:  .  insulin glargine (LANTUS) 100 UNIT/ML injection, Inject 20 Units into the skin daily as needed. , Disp: , Rfl:  .  lansoprazole (PREVACID) 15 MG capsule, Take 15 mg by mouth as needed., Disp: , Rfl:  .  metFORMIN (GLUCOPHAGE) 500 MG tablet, Take by mouth 2 (two) times daily with a meal., Disp: , Rfl:  .  metoprolol tartrate (LOPRESSOR) 25 MG tablet, Take 25 mg by mouth 2 (two) times daily. , Disp: , Rfl:  .  montelukast (SINGULAIR) 10 MG tablet, Take 10 mg by mouth at bedtime., Disp: , Rfl:  .  rosuvastatin (CRESTOR) 10 MG tablet, Take 10 mg by mouth daily., Disp: , Rfl:  .  metroNIDAZOLE (  FLAGYL) 500 MG tablet, Take 1 tablet (500 mg total) by mouth 2 (two) times daily for 7 days., Disp: 14 tablet, Rfl: 0 Social History: Reviewed -  reports that she quit smoking about 5 years ago. Her smoking use included cigarettes. She smoked 0.50 packs per day. She has never used smokeless tobacco.  Review of Systems:   Constitutional: Negative for fever and chills Eyes: Negative for visual disturbances Respiratory: Negative for shortness of breath, dyspnea Cardiovascular: Negative for chest pain or palpitations  Gastrointestinal: Negative for vomiting, diarrhea and constipation; no abdominal pain Genitourinary: Negative for dysuria and urgency, vaginal irritation or itching Musculoskeletal: Negative for back pain, joint pain, myalgias  Neurological: Negative for dizziness and headaches    Objective Findings:     Physical Examination: Vitals:   02/17/19 0856  BP: 122/75  Pulse: 69   General appearance - well appearing, and in no distress Mental status - alert, oriented to person, place, and time Chest:  Normal respiratory effort Heart - normal rate and regular rhythm Abdomen:  Soft, nontender Pelvic: SSE:  Vagina red, thin yellow frothy dc.  Wet prer + WBC and trich. No clue or yeast Musculoskeletal:  Normal range of motion without pain Extremities:  No edema    No results found for this or any previous visit (from the past 24 hour(s)).    Assessment & Plan:  A:   Trichomonas P:   Orders Placed This Encounter  Procedures  . GC/Chlamydia Probe Amp  . HIV Antibody (routine testing w rflx)  . RPR   Rx flagyl 500mg  BID X7; partner to get treated, recommended full STD panel, as well.    Return for If you have any problems.  Christin Fudge CNM 02/17/2019 10:41 AM

## 2019-02-18 LAB — RPR: RPR Ser Ql: NONREACTIVE

## 2019-02-18 LAB — GC/CHLAMYDIA PROBE AMP
Chlamydia trachomatis, NAA: NEGATIVE
Neisseria gonorrhoeae by PCR: NEGATIVE

## 2019-02-18 LAB — HIV ANTIBODY (ROUTINE TESTING W REFLEX): HIV Screen 4th Generation wRfx: NONREACTIVE

## 2019-04-05 ENCOUNTER — Telehealth: Payer: Self-pay | Admitting: *Deleted

## 2019-04-05 ENCOUNTER — Encounter: Payer: Self-pay | Admitting: Advanced Practice Midwife

## 2019-04-05 ENCOUNTER — Telehealth: Payer: Self-pay | Admitting: Advanced Practice Midwife

## 2019-04-05 NOTE — Telephone Encounter (Signed)
Patient states she is having the same symptoms that she had back in March when she had trichomonas; discharge, itching and irritation.  The discharge is leaving her panties wet.  States her partner said he was treated by his PCP but she is not for certain.  She is requesting re-treatment. Please advise.

## 2019-04-05 NOTE — Telephone Encounter (Signed)
Patient called stating that she would like a call back from Tea or her Nurse, Patient did not state the reason why. Please contact pt

## 2019-04-07 ENCOUNTER — Telehealth: Payer: Self-pay | Admitting: Obstetrics & Gynecology

## 2019-04-07 MED ORDER — METRONIDAZOLE 500 MG PO TABS: 500.0000 mg | ORAL_TABLET | Freq: Two times a day (BID) | ORAL | 0 refills | Status: DC

## 2019-04-07 NOTE — Telephone Encounter (Signed)
Meds ordered this encounter  Medications  . metroNIDAZOLE (FLAGYL) 500 MG tablet    Sig: Take 1 tablet (500 mg total) by mouth 2 (two) times daily.    Dispense:  14 tablet    Refill:  0

## 2019-07-29 ENCOUNTER — Telehealth: Payer: Self-pay | Admitting: Obstetrics and Gynecology

## 2019-07-29 NOTE — Telephone Encounter (Signed)
Pt advised she needs an appt.. Call transferred to front desk for appt. [place]

## 2019-07-29 NOTE — Telephone Encounter (Signed)
Pt states that she is having issues with her bladder. She states that her underwear keeps getting wet even when she doesn't feel like she needs to urinate. Pt requesting a call from the nurse. May leave detailed message if unable to answer.

## 2019-08-08 ENCOUNTER — Ambulatory Visit (INDEPENDENT_AMBULATORY_CARE_PROVIDER_SITE_OTHER): Payer: BC Managed Care – PPO | Admitting: Obstetrics and Gynecology

## 2019-08-08 ENCOUNTER — Other Ambulatory Visit: Payer: Self-pay

## 2019-08-08 ENCOUNTER — Encounter: Payer: Self-pay | Admitting: Obstetrics and Gynecology

## 2019-08-08 VITALS — BP 138/74 | HR 61 | Ht 68.0 in | Wt 254.6 lb

## 2019-08-08 DIAGNOSIS — A599 Trichomoniasis, unspecified: Secondary | ICD-10-CM

## 2019-08-08 MED ORDER — METRONIDAZOLE 0.75 % VA GEL: 1.0000 | Freq: Every day | VAGINAL | 1 refills | Status: DC

## 2019-08-08 MED ORDER — METRONIDAZOLE 500 MG PO TABS: 500.0000 mg | ORAL_TABLET | Freq: Two times a day (BID) | ORAL | 0 refills | Status: AC

## 2019-08-08 NOTE — Patient Instructions (Signed)
Trichomoniasis Trichomoniasis is an STI (sexually transmitted infection) that can affect both women and men. In women, the outer area of the female genitalia (vulva) and the vagina are affected. In men, mainly the penis is affected, but the prostate and other reproductive organs can also be involved.  This condition can be treated with medicine. It often has no symptoms (is asymptomatic), especially in men. If not treated, trichomoniasis can last for months or years. What are the causes? This condition is caused by a parasite called Trichomonas vaginalis. Trichomoniasis most often spreads from person to person (is contagious) through sexual contact. What increases the risk? The following factors may make you more likely to develop this condition:  Having unprotected sex.  Having sex with a partner who has trichomoniasis.  Having multiple sexual partners.  Having had previous trichomoniasis infections or other STIs. What are the signs or symptoms? In women, symptoms of trichomoniasis include:  Abnormal vaginal discharge that is clear, white, gray, or yellow-green and foamy and has an unusual "fishy" odor.  Itching and irritation of the vagina and vulva.  Burning or pain during urination or sex.  Redness and swelling of the genitals. In men, symptoms of trichomoniasis include:  Penile discharge that may be foamy or contain pus.  Pain in the penis. This may happen only when urinating.  Itching or irritation inside the penis.  Burning after urination or ejaculation. How is this diagnosed? In women, this condition may be found during a routine Pap test or physical exam. It may be found in men during a routine physical exam. Your health care provider may do tests to help diagnose this infection, such as:  Urine tests (men and women).  The following in women: ? Testing the pH of the vagina. ? A vaginal swab test that checks for the Trichomonas vaginalis parasite. ? Testing vaginal  secretions. Your health care provider may test you for other STIs, including HIV (human immunodeficiency virus). How is this treated? This condition is treated with medicine taken by mouth (orally), such as metronidazole or tinidazole, to fight the infection. Your sexual partner(s) also need to be tested and treated.  If you are a woman and you plan to become pregnant or think you may be pregnant, tell your health care provider right away. Some medicines that are used to treat the infection should not be taken during pregnancy. Your health care provider may recommend over-the-counter medicines or creams to help relieve itching or irritation. You may be tested for infection again 3 months after treatment. Follow these instructions at home:  Take and use over-the-counter and prescription medicines, including creams, only as told by your health care provider.  Take your antibiotic medicine as told by your health care provider. Do not stop taking the antibiotic even if you start to feel better.  Do not have sex until 7-10 days after you finish your medicine, or until your health care provider approves. Ask your health care provider when you may start to have sex again.  (Women) Do not douche or wear tampons while you have the infection.  Discuss your infection with your sexual partner(s). Make sure that your partner gets tested and treated, if necessary.  Keep all follow-up visits as told by your health care provider. This is important. How is this prevented?   Use condoms every time you have sex. Using condoms correctly and consistently can help protect against STIs.  Avoid having multiple sexual partners.  Talk with your sexual partner about any  symptoms that either of you may have, as well as any history of STIs.  Get tested for STIs and STDs (sexually transmitted diseases) before you have sex. Ask your partner to do the same.  Do not have sexual contact if you have symptoms of  trichomoniasis or another STI. Contact a health care provider if:  You still have symptoms after you finish your medicine.  You develop pain in your abdomen.  You have pain when you urinate.  You have bleeding after sex.  You develop a rash.  You feel nauseous or you vomit.  You plan to become pregnant or think you may be pregnant. Summary  Trichomoniasis is an STI (sexually transmitted infection) that can affect both women and men.  This condition often has no symptoms (is asymptomatic), especially in men.  Without treatment, this condition can last for months or years.  You should not have sex until 7-10 days after you finish your medicine, or until your health care provider approves. Ask your health care provider when you may start to have sex again.  Discuss your infection with your sexual partner(s). Make sure that your partner gets tested and treated, if necessary. This information is not intended to replace advice given to you by your health care provider. Make sure you discuss any questions you have with your health care provider. Document Released: 05/27/2001 Document Revised: 09/14/2018 Document Reviewed: 09/14/2018 Elsevier Patient Education  2020 [employer].

## 2019-08-08 NOTE — Progress Notes (Signed)
Patient ID: Megan Mcknight, female   DOB: Nov 16, 1961, 58 y.o.   MRN: [id number]    [facility] Visit  @DATE @            Patient name: Megan Mcknight MRN [id number]  Date of birth: 28-Sep-1961  CC & HPI:  Megan Mcknight is a 58 y.o. female presenting today for POC for trich and bladder leakage. Wakes up in the morning with her underwear wet, is unsure if it is urine doesn't feel she has peed on herself. Can make it to the restroom in time. Not sexually active for past 2 months, partner would take off condom during sex.  She had to "kick him to the curb", no longer with that individual.  No sex in 2 months  ROS:  ROS +trichomoniasis +bladder leakage -fever -vaginal itching  Pertinent History Reviewed:   Reviewed: Significant for Medical         Past Medical History:  Diagnosis Date  . Acute bronchitis   . Anaplastic large cell lymphoma 2014   remission since 2015  . Asthma   . Cancer ([place])    lung and kidney  . Colon polyps 02/2013   Per colonoscopy  . Coma ([place])   . Diabetes mellitus without complication ([place])   . Diverticulosis 02/2013   Per colonoscopy  . Dysrhythmia   . Former light tobacco smoker   . GERD (gastroesophageal reflux disease)   . H/O stem cell transplant ([place]) 2015  . Hyperlipidemia   . Nerve pain    Left leg  . Non Hodgkin's lymphoma ([place])   . Seasonal allergies                               Surgical Hx:    Past Surgical History:  Procedure Laterality Date  . bones removed from Baby toes    . COLONOSCOPY  02/16/2012   Procedure: COLONOSCOPY;  Surgeon: Daneil Dolin, MD;  [facility];  Service: Endoscopy;  Laterality: N/A;  1:30  . COLONOSCOPY N/A 03/26/2017   Procedure: COLONOSCOPY;  Surgeon: Daneil Dolin, MD;  [facility];  Service: Endoscopy;  Laterality: N/A;  930  . MEDIASTINOSCOPY N/A 05/13/2013   Procedure: MEDIASTINOSCOPY;  Surgeon: Melrose Nakayama, MD;  Location: [facility];  Service: Thoracic;  Laterality: N/A;   . PARTIAL HYSTERECTOMY    . VAGINAL HYSTERECTOMY     partial hyst   . VIDEO BRONCHOSCOPY WITH ENDOBRONCHIAL ULTRASOUND N/A 05/13/2013   Procedure: VIDEO BRONCHOSCOPY WITH ENDOBRONCHIAL ULTRASOUND;  Surgeon: Melrose Nakayama, MD;  Location: [place] [facility];  Service: Thoracic;  Laterality: N/A;   Medications: Reviewed & Updated - see associated section                       Current Outpatient Medications:  .  albuterol (PROVENTIL HFA;VENTOLIN HFA) 108 (90 Base) MCG/ACT inhaler, Inhale 2 puffs into the lungs every 4 (four) hours as needed for wheezing or shortness of breath., Disp: 1 Inhaler, Rfl: 2 .  amLODipine (NORVASC) 5 MG tablet, Take 5 mg by mouth daily., Disp: , Rfl:  .  metFORMIN (GLUCOPHAGE) 500 MG tablet, Take by mouth 2 (two) times daily with a meal., Disp: , Rfl:  .  metoprolol tartrate (LOPRESSOR) 25 MG tablet, Take 25 mg by mouth 2 (two) times daily. , Disp: , Rfl:  .  metroNIDAZOLE (FLAGYL) 500 MG tablet,  Take 1 tablet (500 mg total) by mouth 2 (two) times daily., Disp: 14 tablet, Rfl: 0 .  montelukast (SINGULAIR) 10 MG tablet, Take 10 mg by mouth at bedtime., Disp: , Rfl:  .  rosuvastatin (CRESTOR) 10 MG tablet, Take 10 mg by mouth daily., Disp: , Rfl:    Social History: Reviewed -  reports that she quit smoking about 6 years ago. Her smoking use included cigarettes. She smoked 0.50 packs per day. She has never used smokeless tobacco.  Objective Findings:  Vitals: Blood pressure 138/74, pulse 61, height 5\' 8"  (1.727 m), weight 254 lb 9.6 oz (115.5 kg).  PHYSICAL EXAMINATION General appearance - alert, well appearing, and in no distress Mental status - alert, oriented to person, place, and time, normal mood, behavior, speech, dress, motor activity, and thought processes, affect appropriate to mood  PELVIC Vagina - moderate discharge, good bladder support Cervix - surgically absent Uterus - surgically absent [place] - pos trich and white cells  Assessment & Plan:   A:   1.  Trichomoniasis recurrent  P:  1. Retreat with metronidazole will treat orally, and vaginal    By signing my name below, I, Samul Dada, attest that this documentation has been prepared under the direction and in the presence of Jonnie Kind, MD. Electronically Signed: [place]. 08/08/19. 2:59 PM.  I personally performed the services described in this documentation, which was SCRIBED in my presence. The recorded information has been reviewed and considered accurate. It has been edited as necessary during review. Jonnie Kind, MD

## 2019-09-02 ENCOUNTER — Inpatient Hospital Stay (HOSPITAL_BASED_OUTPATIENT_CLINIC_OR_DEPARTMENT_OTHER): Payer: BC Managed Care – PPO | Admitting: Oncology

## 2019-09-02 ENCOUNTER — Other Ambulatory Visit: Payer: Self-pay

## 2019-09-02 ENCOUNTER — Inpatient Hospital Stay: Payer: BC Managed Care – PPO | Attending: Oncology

## 2019-09-02 VITALS — BP 116/72 | HR 67 | Temp 98.3°F | Resp 17 | Ht 68.0 in | Wt 255.9 lb

## 2019-09-02 DIAGNOSIS — Z79899 Other long term (current) drug therapy: Secondary | ICD-10-CM | POA: Diagnosis not present

## 2019-09-02 DIAGNOSIS — Z888 Allergy status to other drugs, medicaments and biological substances status: Secondary | ICD-10-CM | POA: Diagnosis not present

## 2019-09-02 DIAGNOSIS — C8460 Anaplastic large cell lymphoma, ALK-positive, unspecified site: Secondary | ICD-10-CM

## 2019-09-02 DIAGNOSIS — Z8572 Personal history of non-Hodgkin lymphomas: Secondary | ICD-10-CM | POA: Diagnosis present

## 2019-09-02 LAB — CBC WITH DIFFERENTIAL (CANCER CENTER ONLY)
Abs Immature Granulocytes: 0.01 10*3/uL (ref 0.00–0.07)
Basophils Absolute: 0 10*3/uL (ref 0.0–0.1)
Basophils Relative: 0 %
Eosinophils Absolute: 0.1 10*3/uL (ref 0.0–0.5)
Eosinophils Relative: 2 %
HCT: 40 % (ref 36.0–46.0)
Hemoglobin: 12.5 g/dL (ref 12.0–15.0)
Immature Granulocytes: 0 %
Lymphocytes Relative: 40 %
Lymphs Abs: 2.2 10*3/uL (ref 0.7–4.0)
MCH: 27.4 pg (ref 26.0–34.0)
MCHC: 31.3 g/dL (ref 30.0–36.0)
MCV: 87.7 fL (ref 80.0–100.0)
Monocytes Absolute: 0.4 10*3/uL (ref 0.1–1.0)
Monocytes Relative: 6 %
Neutro Abs: 2.8 10*3/uL (ref 1.7–7.7)
Neutrophils Relative %: 52 %
Platelet Count: 169 10*3/uL (ref 150–400)
RBC: 4.56 MIL/uL (ref 3.87–5.11)
RDW: 13.4 % (ref 11.5–15.5)
WBC Count: 5.5 10*3/uL (ref 4.0–10.5)
nRBC: 0 % (ref 0.0–0.2)

## 2019-09-02 LAB — CMP (CANCER CENTER ONLY)
ALT: 17 U/L (ref 0–44)
AST: 17 U/L (ref 15–41)
Albumin: 4 g/dL (ref 3.5–5.0)
Alkaline Phosphatase: 49 U/L (ref 38–126)
Anion gap: 7 (ref 5–15)
BUN: 12 mg/dL (ref 6–20)
CO2: 28 mmol/L (ref 22–32)
Calcium: 9.3 mg/dL (ref 8.9–10.3)
Chloride: 107 mmol/L (ref 98–111)
Creatinine: 0.94 mg/dL (ref 0.44–1.00)
GFR, Est AFR Am: >60 mL/min (ref >60)
GFR, Estimated: >60 mL/min (ref >60)
Glucose, Bld: 141 mg/dL — ABNORMAL HIGH (ref 70–99)
Potassium: 4.2 mmol/L (ref 3.5–5.1)
Sodium: 142 mmol/L (ref 135–145)
Total Bilirubin: 0.5 mg/dL (ref 0.3–1.2)
Total Protein: 7 g/dL (ref 6.5–8.1)

## 2019-09-02 NOTE — Progress Notes (Signed)
[place] OFFICE PROGRESS NOTE 09/02/19   Megan Fire, MD [address] [place] [address]  DIAGNOSIS: 58 year old woman with lymphoma diagnosed in 2014.  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  She continues to be in remission without any need for any salvage therapy.   PRIOR THERAPY:  CHOEP q 21 days started on 05/20/2013.  Completed 6 of 6 planned cycles for CHOEP on 09/05/2013.  Cycle #5 was complicated by ICU-level care secondary to severe hyperglycemia (see prior note for details).  Cycle #6, prednisone was excluded.  She received neulasta after each cycle of therapy.   She underwent autologous SCT on February 4th 2015 at [facility] ([facility]  She has not received any additional therapy after that after achieving remission.  CURRENT THERAPY:  Active surveillance.  INTERVAL HISTORY: Megan Mcknight is here for a follow-up visit.  Since the last visit, she reports no major changes in her health.  She continues to be without any symptoms or signs of cancer relapse.  She denies any lymphadenopathy, excessive fatigue or tiredness.  He denies any abdominal pain discomfort.  She denies any hospitalization or illnesses.  Patient denied any alteration mental status, neuropathy, confusion or dizziness.  Denies any headaches or lethargy.  Denies any night sweats, weight loss or changes in appetite.  Denied orthopnea, dyspnea on exertion or chest discomfort.  Denies shortness of breath, difficulty breathing hemoptysis or cough.  Denies any abdominal distention, nausea, early satiety or dyspepsia.  Denies any hematuria, frequency, dysuria or nocturia.  Denies any skin irritation, dryness or rash.  Denies any ecchymosis or petechiae.  Denies any lymphadenopathy or clotting.  Denies any heat or cold intolerance.  Denies any anxiety or depression.  Remaining review of system is negative.        ALLERGIES:   is allergic to glucosamine; iodine; and shellfish-derived products.  MEDICATIONS: Reviewed personally today without any changes:    Albuterol, amlodipine, metformin, metoprolol tartrate, metronidazole, montelukast, and rosuvastatin.    PHYSICAL EXAMINATION:  Blood pressure 116/72, pulse 67, temperature 98.3 F (36.8 C), temperature source Oral, resp. rate 17, height _0  (1.727 m), weight 255 lb 14.4 oz (116.1 kg), SpO2 100 %.   ECOG 0    General appearance: Alert, awake without any distress. Head: Atraumatic without abnormalities Oropharynx: Without any thrush or ulcers. Eyes: No scleral icterus. Lymph nodes: No lymphadenopathy noted in the cervical, supraclavicular, or axillary nodes Heart:regular rate and rhythm, without any murmurs or gallops.   Lung: Clear to auscultation without any rhonchi, wheezes or dullness to percussion. Abdomin: Soft, nontender without any shifting dullness or ascites. Musculoskeletal: No clubbing or cyanosis. Neurological: No motor or sensory deficits. Skin: No rashes or lesions.      Labs:  Lab Results  Component Value Date   WBC 7.5 09/03/2018   HGB 12.1 09/03/2018   HCT 38.2 09/03/2018   MCV 86.2 09/03/2018   PLT 151 09/03/2018   NEUTROABS 4.0 09/03/2018      Chemistry      Component Value Date/Time   NA 147 (H) 09/03/2018 1025   NA 144 09/04/2017 0916   K 3.8 09/03/2018 1025   K 4.1 09/04/2017 0916   CL 110 09/03/2018 1025   CO2 29 09/03/2018 1025   CO2 26 09/04/2017 0916   BUN 12 09/03/2018 1025   BUN 15.6 09/04/2017 0916   CREATININE 0.93 09/03/2018 1025   CREATININE 1.0 09/04/2017 0916  Component Value Date/Time   CALCIUM 9.5 09/03/2018 1025   CALCIUM 9.5 09/04/2017 0916   ALKPHOS 55 09/03/2018 1025   ALKPHOS 49 09/04/2017 0916   AST 17 09/03/2018 1025   AST 19 09/04/2017 0916   ALT 13 09/03/2018 1025   ALT 18 09/04/2017 0916   BILITOT 0.5 09/03/2018 1025   BILITOT 0.49 09/04/2017 0916        ASSESSMENT AND PLAN:    58 year old woman with:    1.  Lymphoma diagnosed in 2014.  She presented with anaplastic large cell subtype and continues to be in remission after stem cell transplant.    The natural course of this disease was discussed today as well as risk of relapse was assessed.  Salvage therapy associated with this disease was also reviewed case he develops a relapse.  CBC from today was normal and she has no clinical signs symptoms to suggest disease progression.  Imaging studies will be obtained as needed.  She is agreeable to continue with this plan.   2. Age-appropriate cancer screening: She is up-to-date at this time will continue to emphasize the importance of keeping her mammography and colonoscopy up-to-date.  Her next mammography is for October this year.  3. Vaccinations: No recent infections or hospitalizations.  He is up-to-date on her vaccinations.  4. Follow-up: In 12 months for repeat evaluation.  15  minutes was spent with the patient face-to-face today.  More than 50% of time was spent on updating her disease status, treatment options and answering questions regarding future plan of care.    Zola Button MD 09/02/19

## 2019-09-05 ENCOUNTER — Telehealth: Payer: Self-pay | Admitting: Oncology

## 2019-09-05 NOTE — Telephone Encounter (Signed)
Scheduled appt per 9/8=18 sch message.  Spoke with patient and she is aware of her appt date and time.

## 2019-09-08 ENCOUNTER — Ambulatory Visit: Payer: BC Managed Care – PPO | Admitting: Obstetrics and Gynecology

## 2019-09-28 ENCOUNTER — Other Ambulatory Visit: Payer: Self-pay

## 2019-09-28 ENCOUNTER — Ambulatory Visit: Payer: BC Managed Care – PPO | Admitting: Obstetrics and Gynecology

## 2019-09-28 ENCOUNTER — Other Ambulatory Visit (HOSPITAL_COMMUNITY): Payer: Self-pay | Admitting: Internal Medicine

## 2019-09-28 ENCOUNTER — Encounter: Payer: Self-pay | Admitting: Obstetrics and Gynecology

## 2019-09-28 VITALS — BP 116/77 | HR 55 | Ht 68.0 in | Wt 252.0 lb

## 2019-09-28 DIAGNOSIS — A599 Trichomoniasis, unspecified: Secondary | ICD-10-CM | POA: Diagnosis not present

## 2019-09-28 DIAGNOSIS — Z1231 Encounter for screening mammogram for malignant neoplasm of breast: Secondary | ICD-10-CM

## 2019-09-28 NOTE — Progress Notes (Signed)
Patient ID: Megan Mcknight, female   DOB: 1961/05/23, 58 y.o.   MRN: [id number]    [facility] Visit  @DATE @            Patient name: Megan Mcknight MRN [id number]  Date of birth: 11-02-1961  CC & HPI:  Megan Mcknight is a 58 y.o. female presenting today for POC trich. No longer having vaginal discharge. Unsure if ex-partner received treatment, she is no longer in contact with him. He told her that he got treatment but in reality didn't.  ROS:  ROS -vaginal discharge -vaginal itching  Pertinent History Reviewed:   Reviewed: Significant for Medical         Past Medical History:  Diagnosis Date   Acute bronchitis    Anaplastic large cell lymphoma 2014   remission since 2015   Asthma    Cancer ([place])    lung and kidney   Colon polyps 02/2013   Per colonoscopy   Coma ([place])    Diabetes mellitus without complication ([place])    Diverticulosis 02/2013   Per colonoscopy   Dysrhythmia    Former light tobacco smoker    GERD (gastroesophageal reflux disease)    H/O stem cell transplant ([place]) 2015   Hyperlipidemia    Nerve pain    Left leg   Non Hodgkin's lymphoma ([place])    Seasonal allergies                               Surgical Hx:    Past Surgical History:  Procedure Laterality Date   bones removed from Baby toes     COLONOSCOPY  02/16/2012   Procedure: COLONOSCOPY;  Surgeon: Daneil Dolin, MD;  [facility];  Service: Endoscopy;  Laterality: N/A;  1:30   COLONOSCOPY N/A 03/26/2017   Procedure: COLONOSCOPY;  Surgeon: Daneil Dolin, MD;  [facility];  Service: Endoscopy;  Laterality: N/A;  930   MEDIASTINOSCOPY N/A 05/13/2013   Procedure: MEDIASTINOSCOPY;  Surgeon: Melrose Nakayama, MD;  Location: [facility];  Service: Thoracic;  Laterality: N/A;   PARTIAL HYSTERECTOMY     VAGINAL HYSTERECTOMY     partial hyst    VIDEO BRONCHOSCOPY WITH ENDOBRONCHIAL ULTRASOUND N/A 05/13/2013   Procedure: VIDEO BRONCHOSCOPY WITH ENDOBRONCHIAL  ULTRASOUND;  Surgeon: Melrose Nakayama, MD;  Location: [facility];  Service: Thoracic;  Laterality: N/A;   Medications: Reviewed & Updated - see associated section                       Current Outpatient Medications:    albuterol (PROVENTIL HFA;VENTOLIN HFA) 108 (90 Base) MCG/ACT inhaler, Inhale 2 puffs into the lungs every 4 (four) hours as needed for wheezing or shortness of breath., Disp: 1 Inhaler, Rfl: 2   amLODipine (NORVASC) 5 MG tablet, Take 5 mg by mouth daily., Disp: , Rfl:    metFORMIN (GLUCOPHAGE) 500 MG tablet, Take by mouth 2 (two) times daily with a meal., Disp: , Rfl:    metoprolol tartrate (LOPRESSOR) 25 MG tablet, Take 25 mg by mouth 2 (two) times daily. , Disp: , Rfl:    metroNIDAZOLE (METROGEL) 0.75 % vaginal gel, Place 1 Applicatorful vaginally at bedtime. Apply one applicatorful to vagina at bedtime for 5 days, Disp: 70 g, Rfl: 1   montelukast (SINGULAIR) 10 MG tablet, Take 10 mg by mouth at bedtime., Disp: , Rfl:  rosuvastatin (CRESTOR) 10 MG tablet, Take 10 mg by mouth daily., Disp: , Rfl:    Social History: Reviewed -  reports that she quit smoking about 6 years ago. Her smoking use included cigarettes. She smoked 0.50 packs per day. She has never used smokeless tobacco.  Objective Findings:  Vitals: There were no vitals taken for this visit.  PHYSICAL EXAMINATION General appearance - alert, well appearing, and in no distress Mental status - alert, oriented to person, place, and time, normal mood, behavior, speech, dress, motor activity, and thought processes  PELVIC Vagina - normal secretions Cervix - nml appearance  Uterus - not examined  [place] - POC negative for trich, neg yeast or clue   Assessment & Plan:   A:   1.  POC trichomoniasis, cure confirmed  P:  1.  PRN    By signing my name below, I, Samul Dada, attest that this documentation has been prepared under the direction and in the presence of Jonnie Kind,  MD. Electronically Signed: [place]. 09/28/19. 9:37 AM.  I personally performed the services described in this documentation, which was SCRIBED in my presence. The recorded information has been reviewed and considered accurate. It has been edited as necessary during review. Jonnie Kind, MD

## 2019-10-04 ENCOUNTER — Telehealth: Payer: Self-pay

## 2019-10-04 ENCOUNTER — Other Ambulatory Visit: Payer: Self-pay | Admitting: Oncology

## 2019-10-04 DIAGNOSIS — C8460 Anaplastic large cell lymphoma, ALK-positive, unspecified site: Secondary | ICD-10-CM

## 2019-10-04 NOTE — Telephone Encounter (Signed)
Called patient to inform her of information below.  Patient informed to expect a call from central scheduling to schedule the scans. In addition to assessment below, patient denies difficulties breathing or swallowing. Patient advised to seek medical attention if she has difficulty breathing. Patient verbalized understanding.

## 2019-10-04 NOTE — Telephone Encounter (Signed)
-----   Message from Wyatt Portela, MD sent at 10/04/2019 11:42 AM EDT ----- Regarding: RE: Patient advice request Please let her know that she needs a CT scan of the neck and chest. Will order it ASAP and follow up with me after scans. Thanks.  ----- Message ----- From: Teodoro Spray, RN Sent: 10/04/2019  11:30 AM EDT To: Wyatt Portela, MD Subject: Patient advice request                         Patient states she has had neck and L sided facial swelling x 3 days. Patient saw PCP today and was advised to see oncologist as he stated he feels that her lymphnodes are involved and also due to her history of cancer. Patient states she has pain when swollen areas are touched, but denies discoloration. Patient states this is the first time this has happened. Patient's last office visit was 09/02/19.  Please advise.

## 2019-10-07 ENCOUNTER — Ambulatory Visit (HOSPITAL_COMMUNITY)
Admission: RE | Admit: 2019-10-07 | Discharge: 2019-10-07 | Disposition: A | Discharge status: Home / Self Care | Payer: BC Managed Care – PPO | Source: Ambulatory Visit | Attending: Oncology | Admitting: Oncology

## 2019-10-07 ENCOUNTER — Telehealth: Payer: Self-pay | Admitting: Oncology

## 2019-10-07 ENCOUNTER — Other Ambulatory Visit: Payer: Self-pay | Admitting: Oncology

## 2019-10-07 ENCOUNTER — Telehealth: Payer: Self-pay

## 2019-10-07 ENCOUNTER — Other Ambulatory Visit: Payer: Self-pay

## 2019-10-07 DIAGNOSIS — C8460 Anaplastic large cell lymphoma, ALK-positive, unspecified site: Secondary | ICD-10-CM | POA: Diagnosis not present

## 2019-10-07 DIAGNOSIS — C8590 Non-Hodgkin lymphoma, unspecified, unspecified site: Secondary | ICD-10-CM | POA: Diagnosis not present

## 2019-10-07 DIAGNOSIS — C8330 Diffuse large B-cell lymphoma, unspecified site: Secondary | ICD-10-CM | POA: Diagnosis not present

## 2019-10-07 MED ORDER — IOHEXOL 300 MG/ML  SOLN
100.0000 mL | Freq: Once | INTRAMUSCULAR | Status: AC | PRN
  Administered 2019-10-07: 100 mL via INTRAVENOUS

## 2019-10-07 MED ORDER — PREDNISONE 20 MG PO TABS: 60.0000 mg | ORAL_TABLET | Freq: Every day | ORAL | 1 refills | Status: DC

## 2019-10-07 MED ORDER — SODIUM CHLORIDE (PF) 0.9 % IJ SOLN
INTRAMUSCULAR | Status: AC
  Filled 2019-10-07: qty 50

## 2019-10-07 NOTE — Telephone Encounter (Signed)
Contacted patient and made her aware of prednisone prescription to be picked up as well as the MD appointment moved to Monday 10/26. Also made the patient aware that if she has any SOB over the weekend to be seen in the ED per Directive from Dr. Alen Blew. Patient verbalized understanding and had no other questions or concerns.

## 2019-10-07 NOTE — Telephone Encounter (Signed)
-----   Message from Wyatt Portela, MD sent at 10/07/2019 11:51 AM EDT ----- Regarding: RE: pt concerns I will move her appointment to Monday. I will send Rx for Prednisone to decrease the swelling.  She is to go to emergency department if she develops symptoms of shortness of breath over the weekend. ----- Message ----- From: Scot Dock, RN Sent: 10/07/2019  11:32 AM EDT To: Wyatt Portela, MD Subject: pt concerns                                    The left side of her neck and face have been swollen since Monday morning. She has left ear pain and her PCP ruled out any ear infection. She is having occ dizziness, not impacting swallow, no SOB, no temp or any other symptoms. Her next appt is 10/29

## 2019-10-07 NOTE — Telephone Encounter (Signed)
Scheduled appt per 10/21 sch message -- pt is aware of appt date and time

## 2019-10-07 NOTE — Telephone Encounter (Signed)
R/s appt per 10/23 sch message - pt aware appt moved to 10/26

## 2019-10-10 ENCOUNTER — Other Ambulatory Visit: Payer: Self-pay

## 2019-10-10 ENCOUNTER — Inpatient Hospital Stay: Payer: BC Managed Care – PPO | Attending: Oncology | Admitting: Oncology

## 2019-10-10 ENCOUNTER — Telehealth: Payer: Self-pay | Admitting: Oncology

## 2019-10-10 VITALS — BP 137/77 | HR 74 | Temp 98.2°F | Resp 17 | Ht 68.0 in | Wt 254.1 lb

## 2019-10-10 DIAGNOSIS — C8460 Anaplastic large cell lymphoma, ALK-positive, unspecified site: Secondary | ICD-10-CM | POA: Diagnosis not present

## 2019-10-10 DIAGNOSIS — R590 Localized enlarged lymph nodes: Secondary | ICD-10-CM | POA: Insufficient documentation

## 2019-10-10 DIAGNOSIS — Z7984 Long term (current) use of oral hypoglycemic drugs: Secondary | ICD-10-CM | POA: Diagnosis not present

## 2019-10-10 DIAGNOSIS — Z79899 Other long term (current) drug therapy: Secondary | ICD-10-CM | POA: Diagnosis not present

## 2019-10-10 DIAGNOSIS — Z8572 Personal history of non-Hodgkin lymphomas: Secondary | ICD-10-CM | POA: Diagnosis present

## 2019-10-10 NOTE — Progress Notes (Signed)
[place] ON[facility]OG[facility] OFFICE PROGRESS NOTE 10/10/19   Megan Fire, MD [address] [place] [address]  DIAGNOSIS: 58 year old woman with anaplastic large T-cell lymphoma lymphoma diagnosed in 2014.  Her tumor is CD30 and ALK positive, CD20 negative at the time of diagnosis.   PRIOR THERAPY:  CHOEP q 21 days started on 05/20/2013.  Completed 6 of 6 planned cycles for CHOEP on 09/05/2013.  Cycle #5 was complicated by ICU-level care secondary to severe hyperglycemia (see prior note for details).  Cycle #6, prednisone was excluded.  She received neulasta after each cycle of therapy.   She underwent autologous SCT on February 4th 2015 at [facility] ([facility]  She has not received any additional therapy after that after achieving remission.  CURRENT THERAPY: Under consideration for possible relapse.  INTERVAL HISTORY: Megan Mcknight returns today for a repeat evaluation.  She has a reported increasing and cervical lymph nodes in the last few weeks.  She denies any fevers chills or difficulty breathing.  She noticed a swelling on the left aspect of her neck which did not improve at that time.  She was prescribed a prednisone 60 mg daily which she has taken for the last 3 days with improvement in her symptoms.  She still has some swelling but also overall improved.   She denied headaches, blurry vision, syncope or seizures.  Denies any fevers, chills or sweats.  Denied chest pain, palpitation, orthopnea or leg edema.  Denied cough, wheezing or hemoptysis.  Denied nausea, vomiting or abdominal pain.  Denies any constipation or diarrhea.  Denies any frequency urgency or hesitancy.  Denies any arthralgias or myalgias.  Denies any skin rashes or lesions.  Denies any bleeding or clotting tendency.  Denies any easy bruising.  Denies any hair or nail changes.  Denies any anxiety or depression.  Remaining review of system is negative.         ALLERGIES:  is allergic to  glucosamine; iodine; and shellfish-derived products.  MEDICATIONS: Updated without any changes on review.  Albuterol, amlodipine, metformin, metoprolol tartrate, metronidazole, montelukast, and rosuvastatin.    PHYSICAL EXAMINATION:  Blood pressure 137/77, pulse 74, temperature 98.2 F (36.8 C), temperature source Oral, resp. rate 17, height _0  (1.727 m), weight 254 lb 1.6 oz (115.3 kg), SpO2 99 %.    ECOG 0   General appearance: Comfortable appearing without any discomfort Head: Normocephalic without any trauma Oropharynx: Mucous membranes are moist and pink without any thrush or ulcers. Eyes: Pupils are equal and round reactive to light. Lymph nodes:  Small palpable cervical adenopathy bilaterally. Heart:regular rate and rhythm.  S1 and S2 without leg edema. Lung: Clear without any rhonchi or wheezes.  No dullness to percussion. Abdomin: Soft, nontender, nondistended with good bowel sounds.  No hepatosplenomegaly. Musculoskeletal: No joint deformity or effusion.  Full range of motion noted. Neurological: No deficits noted on motor, sensory and deep tendon reflex exam. Skin: No petechial rash or dryness.  Appeared moist.        Labs:  Lab Results  Component Value Date   WBC 5.5 09/02/2019   HGB 12.5 09/02/2019   HCT 40.0 09/02/2019   MCV 87.7 09/02/2019   PLT 169 09/02/2019   NEUTROABS 2.8 09/02/2019      Chemistry      Component Value Date/Time   NA 142 09/02/2019 1007   NA 144 09/04/2017 0916   K 4.2 09/02/2019 1007   K 4.1 09/04/2017 0916   CL 107  09/02/2019 1007   CO2 28 09/02/2019 1007   CO2 26 09/04/2017 0916   BUN 12 09/02/2019 1007   BUN 15.6 09/04/2017 0916   CREATININE 0.94 09/02/2019 1007   CREATININE 1.0 09/04/2017 0916      Component Value Date/Time   CALCIUM 9.3 09/02/2019 1007   CALCIUM 9.5 09/04/2017 0916   ALKPHOS 49 09/02/2019 1007   ALKPHOS 49 09/04/2017 0916   AST 17 09/02/2019 1007   AST 19 09/04/2017 0916   ALT 17  09/02/2019 1007   ALT 18 09/04/2017 0916   BILITOT 0.5 09/02/2019 1007   BILITOT 0.49 09/04/2017 0916     FINDINGS: Cardiovascular: Heart size normal. No pericardial effusion. Aortic atherosclerosis.  Mediastinum/Nodes: New left supraclavicular node measures 1.2 cm, image 1/3. No mediastinal or hilar adenopathy identified. No axillary adenopathy. The trachea appears patent and is midline.  Lungs/Pleura: No pleural effusion. Scar identified within the posterior right lower lobe. No pneumothorax. No suspicious pulmonary nodule or mass identified.  Upper Abdomen: No acute abnormality.  Musculoskeletal: No aggressive lytic or sclerotic bone lesions.  IMPRESSION: 1. New enlarged left supraclavicular lymph node is identified which may reflect focus of disease recurrence.   FINDINGS: Pharynx and larynx: Normal. No mass or swelling.  Salivary glands: No inflammation, mass, or stone.  Thyroid: Normal  Lymph nodes: Enlarged pathologic lymph nodes in the left neck. Multiple left level 2 lymph nodes are enlarged, the largest is 18 mm in short axis. Numerous other level 2 lymph nodes including a lymph node measuring 12.8 mm in short axis. Enlarged left level 3 nodes largest measuring 14.3 mm. Left level 5 nodes are also present measuring 10 mm and 15 mm. Lymph node lateral to the left submandibular gland is enlarged measuring 13.5 mm.  Right level 2 lymph node 8 mm. No definite enlarged nodes on the right.  Vascular: Normal vascular enhancement.  Limited intracranial: Negative  Visualized orbits: Negative  Mastoids and visualized paranasal sinuses: Negative  Skeleton: Negative  Upper chest: Negative  Other: None  IMPRESSION: Enlarged lymph nodes in the left neck compatible with lymphoma recurrence.    ASSESSMENT AND PLAN:    58 year old woman with:    1.  Anaplastic large cell lymphoma diagnosed in 2014.      CT scan obtained on 10/07/2019  of the neck and chest did show enlargement of cervical lymph nodes with the largest is 18 mm in short axis.  There is also a left supraclavicular lymph node that is involved.  She did have clinical response to prednisone which could indicate lymphoma recurrence versus other etiologies.  The next step is to obtain tissue biopsy given the fact that we are dealing with persistent bilateral lymph nodes that are likely pathological.  We will arrange for ultrasound-guided biopsy and then will proceed with further management approach.  Repeating a PET scan and a possible referral to tertiary care center if we are dealing with recurrent lymphoma.  Other possibilities that include the thyroid disease or other benign etiologies that can cause lymphadenopathy sarcoidosis etc.  I have asked her to discontinue prednisone at this time given her potential hyperglycemia.   2. Follow-up: will be in the near future after completing tissue biopsy.  25  minutes was spent with the patient face-to-face today.  More than 50% of time was dedicated to reviewing imaging studies, differential diagnosis and management options.    Zola Button MD 10/10/19

## 2019-10-10 NOTE — Telephone Encounter (Signed)
Scheduled appt per 10/26 los.  Left a VM of the appt date and time.

## 2019-10-12 ENCOUNTER — Ambulatory Visit (HOSPITAL_COMMUNITY): Payer: BC Managed Care – PPO

## 2019-10-12 ENCOUNTER — Ambulatory Visit (HOSPITAL_COMMUNITY)
Admission: RE | Admit: 2019-10-12 | Discharge: 2019-10-12 | Disposition: A | Discharge status: Home / Self Care | Payer: BC Managed Care – PPO | Source: Ambulatory Visit | Attending: Internal Medicine | Admitting: Internal Medicine

## 2019-10-12 ENCOUNTER — Encounter (HOSPITAL_COMMUNITY): Payer: Self-pay

## 2019-10-12 ENCOUNTER — Other Ambulatory Visit: Payer: Self-pay | Admitting: Oncology

## 2019-10-12 ENCOUNTER — Other Ambulatory Visit: Payer: Self-pay

## 2019-10-12 DIAGNOSIS — Z1231 Encounter for screening mammogram for malignant neoplasm of breast: Secondary | ICD-10-CM | POA: Diagnosis present

## 2019-10-12 DIAGNOSIS — C8460 Anaplastic large cell lymphoma, ALK-positive, unspecified site: Secondary | ICD-10-CM

## 2019-10-12 NOTE — Progress Notes (Signed)
Megan Mcknight Female, 59 y.o., 07/12/1961 MRN:  [id number] Phone:  [phone] Megan Mcknight) PCP:  Rosita Fire, MD Primary Cvg:  Generic Worker's Comp/Generic Worker's Comp Next Appt With Radiology ([facility] 2) 10/17/2019 at 1:00 PM  RE: Biospy Received: Yesterday Message Contents  Arne Cleveland, MD  [name]  Ok   [place] core L cerv LAN  See CT  R/o lymphoma   [facility]   Previous Messages  ----- Message -----  From: [name]  Sent: 10/10/2019  4:44 PM EDT  To: Ir Procedure Requests  Subject: Biospy                      Procedure Requested: CT Biopsy    Reason for Procedure: Cervical adenopathy with history of lymphoma.    Provider Requesting: Wyatt Portela  Provider Telephone: [phone]    Other Info: Rad exams in Epic

## 2019-10-13 ENCOUNTER — Other Ambulatory Visit: Payer: Self-pay | Admitting: Radiology

## 2019-10-13 ENCOUNTER — Ambulatory Visit: Payer: BC Managed Care – PPO | Admitting: Oncology

## 2019-10-14 ENCOUNTER — Other Ambulatory Visit: Payer: Self-pay | Admitting: Radiology

## 2019-10-17 ENCOUNTER — Other Ambulatory Visit: Payer: Self-pay

## 2019-10-17 ENCOUNTER — Encounter (HOSPITAL_COMMUNITY): Payer: Self-pay | Admitting: *Deleted

## 2019-10-17 ENCOUNTER — Ambulatory Visit (HOSPITAL_COMMUNITY)
Admission: RE | Admit: 2019-10-17 | Discharge: 2019-10-17 | Disposition: A | Discharge status: Home / Self Care | Payer: BC Managed Care – PPO | Source: Ambulatory Visit | Attending: Oncology | Admitting: Oncology

## 2019-10-17 ENCOUNTER — Encounter (HOSPITAL_COMMUNITY): Payer: Self-pay

## 2019-10-17 ENCOUNTER — Emergency Department (HOSPITAL_COMMUNITY)
Admission: EM | Admit: 2019-10-17 | Discharge: 2019-10-17 | Disposition: A | Discharge status: Home / Self Care | Payer: BC Managed Care – PPO | Attending: Emergency Medicine | Admitting: Emergency Medicine

## 2019-10-17 DIAGNOSIS — Z7984 Long term (current) use of oral hypoglycemic drugs: Secondary | ICD-10-CM | POA: Insufficient documentation

## 2019-10-17 DIAGNOSIS — R509 Fever, unspecified: Secondary | ICD-10-CM | POA: Diagnosis not present

## 2019-10-17 DIAGNOSIS — C8460 Anaplastic large cell lymphoma, ALK-positive, unspecified site: Secondary | ICD-10-CM | POA: Insufficient documentation

## 2019-10-17 DIAGNOSIS — E1140 Type 2 diabetes mellitus with diabetic neuropathy, unspecified: Secondary | ICD-10-CM | POA: Diagnosis not present

## 2019-10-17 DIAGNOSIS — Z87891 Personal history of nicotine dependence: Secondary | ICD-10-CM | POA: Insufficient documentation

## 2019-10-17 DIAGNOSIS — Z79899 Other long term (current) drug therapy: Secondary | ICD-10-CM | POA: Diagnosis not present

## 2019-10-17 DIAGNOSIS — Z8572 Personal history of non-Hodgkin lymphomas: Secondary | ICD-10-CM | POA: Insufficient documentation

## 2019-10-17 DIAGNOSIS — Z20828 Contact with and (suspected) exposure to other viral communicable diseases: Secondary | ICD-10-CM | POA: Diagnosis not present

## 2019-10-17 DIAGNOSIS — Z85528 Personal history of other malignant neoplasm of kidney: Secondary | ICD-10-CM | POA: Insufficient documentation

## 2019-10-17 DIAGNOSIS — Z85118 Personal history of other malignant neoplasm of bronchus and lung: Secondary | ICD-10-CM | POA: Insufficient documentation

## 2019-10-17 LAB — GLUCOSE, CAPILLARY: Glucose-Capillary: 107 mg/dL — ABNORMAL HIGH (ref 70–99)

## 2019-10-17 LAB — SARS CORONAVIRUS 2 (TAT 6-24 HRS): SARS Coronavirus 2: NEGATIVE

## 2019-10-17 MED ORDER — SODIUM CHLORIDE 0.9 % IV SOLN: INTRAVENOUS | Status: DC

## 2019-10-17 NOTE — ED Triage Notes (Signed)
Pt come in for a biopsy, they took VS and noted pt to have a fever 102.1 orally

## 2019-10-17 NOTE — Progress Notes (Signed)
Pt brought to ER in a wheelchair. Report given to Margarita Sermons. Pt in NAD.

## 2019-10-17 NOTE — Progress Notes (Addendum)
Per Lennette Bihari, [place]. Pt is going to be cancelled for biopsy today due to 102.2 fever. Pt verbalized understanding.  Per Lennette Bihari, pt is to go to ER and be rescheduled at a later date.

## 2019-10-17 NOTE — ED Notes (Signed)
Pt trying to get a hold of her son to let him know to come get her from ED, since he was thinking she having a procedure and not able to pick up til 7pm tonight. Pt instructed to call on call bell when her ride is here, so ED staff can take out to car.

## 2019-10-17 NOTE — ED Provider Notes (Signed)
[facility] DEPT Provider Note   CSN: [id number] Arrival date & time: 10/17/19  1222     History   Chief Complaint Chief Complaint  Patient presents with  . Fever    HPI Megan Mcknight is a 58 y.o. female.     Patient is a 58 year old female with a history of anaplastic large cell lymphoma in remission since 2015 but with recent recurrence, diabetes, non-Hodgkin's lymphoma who is presenting today with a fever.  Patient had a scheduled lymph node biopsy today and she was feeling normal when she woke up but when she got to the procedure they took her temperature and it was 102.4.  She states she has not been feeling any different she has not had any symptoms and was surprised she had a fever.  She has been having ongoing mild pain in the left side of her neck due to the recurrence of the lymphoma and for some time has been having night sweats and some weight loss but that is considered to all be related to the cancer.  She has no known Covid contacts.  She denies any cough, shortness of breath or urinary symptoms.  The history is provided by the patient.  Fever Max temp prior to arrival:  102.4 Temp source:  Oral Severity:  Mild Onset quality:  Unable to specify Timing:  Constant Progression:  Unable to specify Chronicity:  New Relieved by:  None tried Worsened by:  Nothing Ineffective treatments:  None tried Associated symptoms: no chest pain, no chills, no confusion, no congestion, no cough, no diarrhea, no dysuria, no ear pain, no headaches, no myalgias, no nausea, no rhinorrhea, no somnolence and no vomiting   Risk factors: no recent sickness, no recent travel and no sick contacts     Past Medical History:  Diagnosis Date  . Acute bronchitis   . Anaplastic large cell lymphoma 2014   remission since 2015  . Asthma   . Cancer ([place])    lung and kidney  . Colon polyps 02/2013   Per colonoscopy  . Coma ([place])   . Diabetes mellitus without  complication ([place])   . Diverticulosis 02/2013   Per colonoscopy  . Dysrhythmia   . Former light tobacco smoker   . GERD (gastroesophageal reflux disease)   . H/O stem cell transplant ([place]) 2015  . Hyperlipidemia   . Nerve pain    Left leg  . Non Hodgkin's lymphoma ([place])   . Seasonal allergies     Patient Active Problem List   Diagnosis Date Noted  . Trichomonal infection 02/17/2019  . PTTD (posterior tibial tendon dysfunction) 08/21/2015  . Transaminitis 05/31/2014  . Inguinal adenopathy 10/20/2013  . Breast mass, left 10/20/2013  . Neuropathy due to chemotherapeutic drug ([place]) 09/29/2013  . Tumor lysis syndrome 08/21/2013  . Hyperglycemia without ketosis 08/21/2013  . Acute renal failure ([place]) 08/21/2013  . Acute encephalopathy 08/21/2013  . Hyperkalemia 08/21/2013  . Lymphoma ([place]) 05/19/2013  . Shortness of breath 05/06/2013  . Atrial fibrillation with RVR ([place]) 05/06/2013  . Bilateral pneumonia 05/06/2013  . Acute renal insufficiency 05/06/2013  . Normocytic anemia 05/06/2013  . GERD (gastroesophageal reflux disease) 02/02/2012    Past Surgical History:  Procedure Laterality Date  . bones removed from Baby toes    . COLONOSCOPY  02/16/2012   Procedure: COLONOSCOPY;  Surgeon: Daneil Dolin, MD;  [facility];  Service: Endoscopy;  Laterality: N/A;  1:30  . COLONOSCOPY N/A 03/26/2017  Procedure: COLONOSCOPY;  Surgeon: Daneil Dolin, MD;  [facility];  Service: Endoscopy;  Laterality: N/A;  930  . MEDIASTINOSCOPY N/A 05/13/2013   Procedure: MEDIASTINOSCOPY;  Surgeon: [place] Nakayama, MD;  Location: [facility];  Service: Thoracic;  Laterality: N/A;  . PARTIAL HYSTERECTOMY    . VAGINAL HYSTERECTOMY     partial hyst   . VIDEO BRONCHOSCOPY WITH ENDOBRONCHIAL ULTRASOUND N/A 05/13/2013   Procedure: VIDEO BRONCHOSCOPY WITH ENDOBRONCHIAL ULTRASOUND;  Surgeon: [place] Nakayama, MD;  Location: [facility];  Service: Thoracic;  Laterality: N/A;     OB  History    Gravida  3   Para      Term      Preterm      AB      Living  3     SAB      TAB      Ectopic      Multiple      Live Births  3            Home Medications    Prior to Admission medications   Medication Sig Start Date End Date Taking? Authorizing Provider  albuterol (PROVENTIL HFA;VENTOLIN HFA) 108 (90 Base) MCG/ACT inhaler Inhale 2 puffs into the lungs every 4 (four) hours as needed for wheezing or shortness of breath. 10/27/16   Orpah Greek, MD  amLODipine (NORVASC) 5 MG tablet Take 5 mg by mouth daily.    [provider]  metFORMIN (GLUCOPHAGE) 500 MG tablet Take by mouth 2 (two) times daily with a meal.    [provider]  metoprolol tartrate (LOPRESSOR) 25 MG tablet Take 25 mg by mouth 2 (two) times daily.     [provider]  metroNIDAZOLE (METROGEL) 0.75 % vaginal gel Place 1 Applicatorful vaginally at bedtime. Apply one applicatorful to vagina at bedtime for 5 days 08/08/19   Jonnie Kind, MD  montelukast (SINGULAIR) 10 MG tablet Take 10 mg by mouth at bedtime.    [provider]  predniSONE (DELTASONE) 20 MG tablet Take 3 tablets (60 mg total) by mouth daily with breakfast. 10/07/19   Wyatt Portela, MD  rosuvastatin (CRESTOR) 10 MG tablet Take 10 mg by mouth daily.    [provider]    Family History Family History  Problem Relation Age of Onset  . Colon polyps Mother   . Hypertension Mother   . Heart disease Mother   . Cancer Father        prostate  . Hypertension Father   . Heart defect Other        family history   . Asthma Other        family history     Social History Social History   Tobacco Use  . Smoking status: Former Smoker    Packs/day: 0.50    Types: Cigarettes    Quit date: 04/19/2013    Years since quitting: 6.4  . Smokeless tobacco: Never Used  Substance Use Topics  . Alcohol use: No    Comment: one glass every three weeks  . Drug use: No      Allergies   Glucosamine, Iodine, and Shellfish-derived products   Review of Systems Review of Systems  Constitutional: Positive for fever. Negative for chills.  HENT: Negative for congestion, ear pain and rhinorrhea.   Respiratory: Negative for cough.   Cardiovascular: Negative for chest pain.  Gastrointestinal: Negative for diarrhea, nausea and vomiting.  Genitourinary: Negative for dysuria.  Musculoskeletal: Negative for myalgias.  Neurological: Negative for headaches.  Psychiatric/Behavioral: Negative for confusion.  All other systems reviewed and are negative.    Physical Exam Updated Vital Signs BP (!) 143/76 (BP Location: Right Arm)   Pulse 88   Temp (!) 102.4 F (39.1 C) (Oral)   Resp 18   SpO2 98%   Physical Exam Vitals signs and nursing note reviewed.  Constitutional:      General: She is not in acute distress.    Appearance: She is well-developed.  HENT:     Head: Normocephalic and atraumatic.     Right Ear: Tympanic membrane normal.     Left Ear: Tympanic membrane normal.     Nose: Nose normal.  Eyes:     Pupils: Pupils are equal, round, and reactive to light.  Neck:   Cardiovascular:     Rate and Rhythm: Normal rate and regular rhythm.     Heart sounds: Normal heart sounds. No murmur. No friction rub.  Pulmonary:     Effort: Pulmonary effort is normal.     Breath sounds: Normal breath sounds. No wheezing or rales.  Abdominal:     General: Bowel sounds are normal. There is no distension.     Palpations: Abdomen is soft.     Tenderness: There is no abdominal tenderness. There is no guarding or rebound.  Musculoskeletal: Normal range of motion.        General: No tenderness.     Comments: No edema  Skin:    General: Skin is warm and dry.     Findings: No rash.  Neurological:     General: No focal deficit present.     Mental Status: She is alert and oriented to person, place, and time. Mental status is at baseline.     Cranial Nerves: No cranial  nerve deficit.  Psychiatric:        Mood and Affect: Mood normal.        Behavior: Behavior normal.        Thought Content: Thought content normal.      ED Treatments / Results  Labs (all labs ordered are listed, but only abnormal results are displayed) Labs Reviewed  SARS CORONAVIRUS 2 (TAT 6-24 HRS)    EKG None  Radiology No results found.  Procedures Procedures (including critical care time)  Medications Ordered in ED Medications - No data to display   Initial Impression / Assessment and Plan / ED Course  I have reviewed the triage vital signs and the nursing notes.  Pertinent labs & imaging results that were available during my care of the patient were reviewed by me and considered in my medical decision making (see chart for details).        Patient sent here from interventional radiology today due to a fever.  Patient was scheduled to have a lymph node biopsy done and felt normal when she got to the hospital today.  However had a temperature of 102.4.  Patient is asymptomatic says she has been feeling the same and has not had any new symptoms.  Initial temperature of 102.4 was approximately 30 minutes ago she was given no medication and on recheck here it to 100.9.  She is not on chemotherapy or immune compromise at this time.  She has had lab work done within the last month that is been normal.  No known Covid contacts.  She denies any respiratory symptoms.  Patient is well-appearing with otherwise normal vital signs.  She  was not drinking anything hot before getting to the hospital.  Did recommend that she continue to follow her temperature and if she develops new symptoms may need to return to the hospital versus talking with her doctor.  Covid testing was completed.  Concerned that possible fever may be related to the cancer as she has had night sweats, weight loss in the last few weeks to months since the discovery of recurrence.  LAIYLA SLAGEL was evaluated in  Emergency Department on 10/17/2019 for the symptoms described in the history of present illness. She was evaluated in the context of the global COVID-19 pandemic, which necessitated consideration that the patient might be at risk for infection with the SARS-CoV-2 virus that causes COVID-19. Institutional protocols and algorithms that pertain to the evaluation of patients at risk for COVID-19 are in a state of rapid change based on information released by regulatory bodies including the CDC and federal and state organizations. These policies and algorithms were followed during the patient's care in the ED.   Final Clinical Impressions(s) / ED Diagnoses   Final diagnoses:  Fever of unknown origin    ED Discharge Orders    None       Blanchie Dessert, MD 10/17/19 1311

## 2019-10-17 NOTE — Progress Notes (Signed)
Patient ID: Megan Mcknight, female   DOB: November 02, 1961, 58 y.o.   MRN: [id number] Patient presented to [facility] today in preparation for image guided left cervical lymph node biopsy with IR.  Vital signs revealed a temperature of 102.1.  Patient otherwise asymptomatic.  Case discussed with Dr. Alen Blew and he recommended that patient be evaluated in the emergency room.  Biopsy will be rescheduled to later date.  Patient updated.

## 2019-10-17 NOTE — Progress Notes (Addendum)
Pt has fever upon arrival (102.2). Pt denied sob, cough, congestion, loss of taste or smell, etc. Pt states she feels completely fine today.   Lennette Bihari PA notified and will be speaking to radiologist to see if biopsy will be performed today.

## 2019-10-17 NOTE — Discharge Instructions (Signed)
The fever you had today could be a result of your cancer however also concern for possible early Covid.  Your results should be back later today or tomorrow.  It would be very important for you to follow-up with your doctor.  If you start developing any symptoms of shortness of breath, painful urination, confusion or pain please return for further evaluation.

## 2019-10-17 NOTE — ED Notes (Signed)
Pt brought over from short stay. Pt supposed to have lymph node biopsy today, but had temp of 102.

## 2019-10-26 ENCOUNTER — Telehealth: Payer: Self-pay | Admitting: Oncology

## 2019-10-26 ENCOUNTER — Inpatient Hospital Stay: Payer: BC Managed Care – PPO | Admitting: Oncology

## 2019-10-26 NOTE — Telephone Encounter (Signed)
Called pt per 11/11 sch message - unable to reach pt . Left message for patient to call back and reschedule missed apt.

## 2019-10-26 NOTE — Telephone Encounter (Signed)
Returned patient's phone call regarding rescheduling an appointment, per patient's request missed 11/11 appointment has moved to 11/25.

## 2019-10-28 ENCOUNTER — Other Ambulatory Visit: Payer: Self-pay | Admitting: Radiology

## 2019-10-31 ENCOUNTER — Other Ambulatory Visit: Payer: Self-pay

## 2019-10-31 ENCOUNTER — Ambulatory Visit (HOSPITAL_COMMUNITY)
Admission: RE | Admit: 2019-10-31 | Discharge: 2019-10-31 | Disposition: A | Discharge status: Home / Self Care | Payer: BC Managed Care – PPO | Source: Ambulatory Visit | Attending: Oncology | Admitting: Oncology

## 2019-10-31 ENCOUNTER — Encounter (HOSPITAL_COMMUNITY): Payer: Self-pay

## 2019-10-31 DIAGNOSIS — Z792 Long term (current) use of antibiotics: Secondary | ICD-10-CM | POA: Insufficient documentation

## 2019-10-31 DIAGNOSIS — Z85528 Personal history of other malignant neoplasm of kidney: Secondary | ICD-10-CM | POA: Diagnosis not present

## 2019-10-31 DIAGNOSIS — E785 Hyperlipidemia, unspecified: Secondary | ICD-10-CM | POA: Diagnosis not present

## 2019-10-31 DIAGNOSIS — Z7984 Long term (current) use of oral hypoglycemic drugs: Secondary | ICD-10-CM | POA: Insufficient documentation

## 2019-10-31 DIAGNOSIS — Z85118 Personal history of other malignant neoplasm of bronchus and lung: Secondary | ICD-10-CM | POA: Insufficient documentation

## 2019-10-31 DIAGNOSIS — C8460 Anaplastic large cell lymphoma, ALK-positive, unspecified site: Secondary | ICD-10-CM | POA: Diagnosis not present

## 2019-10-31 DIAGNOSIS — Z91013 Allergy to seafood: Secondary | ICD-10-CM | POA: Diagnosis not present

## 2019-10-31 DIAGNOSIS — Z888 Allergy status to other drugs, medicaments and biological substances status: Secondary | ICD-10-CM | POA: Diagnosis not present

## 2019-10-31 DIAGNOSIS — Z886 Allergy status to analgesic agent status: Secondary | ICD-10-CM | POA: Diagnosis not present

## 2019-10-31 DIAGNOSIS — J45909 Unspecified asthma, uncomplicated: Secondary | ICD-10-CM | POA: Insufficient documentation

## 2019-10-31 DIAGNOSIS — Z7952 Long term (current) use of systemic steroids: Secondary | ICD-10-CM | POA: Diagnosis not present

## 2019-10-31 DIAGNOSIS — Z79899 Other long term (current) drug therapy: Secondary | ICD-10-CM | POA: Insufficient documentation

## 2019-10-31 DIAGNOSIS — R590 Localized enlarged lymph nodes: Secondary | ICD-10-CM | POA: Diagnosis not present

## 2019-10-31 DIAGNOSIS — E119 Type 2 diabetes mellitus without complications: Secondary | ICD-10-CM | POA: Insufficient documentation

## 2019-10-31 DIAGNOSIS — Z87891 Personal history of nicotine dependence: Secondary | ICD-10-CM | POA: Diagnosis not present

## 2019-10-31 LAB — CBC WITH DIFFERENTIAL/PLATELET
Abs Immature Granulocytes: 0.03 10*3/uL (ref 0.00–0.07)
Basophils Absolute: 0 10*3/uL (ref 0.0–0.1)
Basophils Relative: 0 %
Eosinophils Absolute: 0 10*3/uL (ref 0.0–0.5)
Eosinophils Relative: 1 %
HCT: 34.2 % — ABNORMAL LOW (ref 36.0–46.0)
Hemoglobin: 10.5 g/dL — ABNORMAL LOW (ref 12.0–15.0)
Immature Granulocytes: 1 %
Lymphocytes Relative: 37 %
Lymphs Abs: 1.1 10*3/uL (ref 0.7–4.0)
MCH: 26.4 pg (ref 26.0–34.0)
MCHC: 30.7 g/dL (ref 30.0–36.0)
MCV: 85.9 fL (ref 80.0–100.0)
Monocytes Absolute: 0.3 10*3/uL (ref 0.1–1.0)
Monocytes Relative: 8 %
Neutro Abs: 1.6 10*3/uL — ABNORMAL LOW (ref 1.7–7.7)
Neutrophils Relative %: 53 %
Platelets: 167 10*3/uL (ref 150–400)
RBC: 3.98 MIL/uL (ref 3.87–5.11)
RDW: 13.2 % (ref 11.5–15.5)
WBC: 3 10*3/uL — ABNORMAL LOW (ref 4.0–10.5)
nRBC: 0 % (ref 0.0–0.2)

## 2019-10-31 LAB — BASIC METABOLIC PANEL
Anion gap: 9 (ref 5–15)
BUN: 10 mg/dL (ref 6–20)
CO2: 23 mmol/L (ref 22–32)
Calcium: 8.8 mg/dL — ABNORMAL LOW (ref 8.9–10.3)
Chloride: 105 mmol/L (ref 98–111)
Creatinine, Ser: 0.92 mg/dL (ref 0.44–1.00)
GFR calc Af Amer: >60 mL/min (ref >60)
GFR calc non Af Amer: >60 mL/min (ref >60)
Glucose, Bld: 95 mg/dL (ref 70–99)
Potassium: 3.9 mmol/L (ref 3.5–5.1)
Sodium: 137 mmol/L (ref 135–145)

## 2019-10-31 LAB — PROTIME-INR
INR: 1.2 (ref 0.8–1.2)
Prothrombin Time: 14.9 seconds (ref 11.4–15.2)

## 2019-10-31 MED ORDER — MIDAZOLAM HCL 2 MG/2ML IJ SOLN
INTRAMUSCULAR | Status: AC
  Filled 2019-10-31: qty 2

## 2019-10-31 MED ORDER — FENTANYL CITRATE (PF) 100 MCG/2ML IJ SOLN
INTRAMUSCULAR | Status: AC
  Filled 2019-10-31: qty 2

## 2019-10-31 MED ORDER — LIDOCAINE HCL 1 % IJ SOLN
INTRAMUSCULAR | Status: AC
  Filled 2019-10-31: qty 20

## 2019-10-31 MED ORDER — MIDAZOLAM HCL 2 MG/2ML IJ SOLN
INTRAMUSCULAR | Status: AC | PRN
  Administered 2019-10-31 (×2): 1 mg via INTRAVENOUS

## 2019-10-31 MED ORDER — LIDOCAINE HCL (PF) 1 % IJ SOLN
INTRAMUSCULAR | Status: AC | PRN
  Administered 2019-10-31: 5 mL

## 2019-10-31 MED ORDER — FENTANYL CITRATE (PF) 100 MCG/2ML IJ SOLN
INTRAMUSCULAR | Status: AC | PRN
  Administered 2019-10-31 (×2): 50 ug via INTRAVENOUS

## 2019-10-31 MED ORDER — SODIUM CHLORIDE 0.9 % IV SOLN
INTRAVENOUS | Status: DC
  Administered 2019-10-31: 12:00:00 via INTRAVENOUS

## 2019-10-31 NOTE — Procedures (Signed)
Interventional Radiology Procedure Note  Procedure: US guided core biopsy, LEFT supraclavicular LN.   Complications: None  Estimated Blood Loss: None  Recommendations: - DC home  Signed,  Criselda Peaches, MD

## 2019-10-31 NOTE — H&P (Addendum)
Referring Physician(s): Wyatt Portela  Supervising Physician: Jacqulynn Cadet  Patient Status:  Megan Mcknight OP  Chief Complaint:  "I'm here for a biopsy"  Subjective: Patient familiar to IR service from prior bone marrow biopsy in 2014, Port-A-Cath placement in 2014 with removal in 2017.  She has a history of anaplastic large T-cell lymphoma initially diagnosed in 2014, status post chemotherapy as well as stem cell transplant in 2015.  Recent CT scans of chest and neck on 10/07/2019 revealed new enlarged left supraclavicular lymph node/enlarged lymph nodes in the left neck concerning for lymphoma recurrence.  She presents today for image guided left cervical lymph node biopsy for further evaluation.  She initially presented as an outpatient for the procedure on 10/17/19, however procedure was canceled secondary to temp of 102.  Patient was COVID-19 negative.  She currently denies fever, headache, chest pain, dyspnea, cough, abdominal/back pain, nausea, vomiting or bleeding.  She has noticed some swelling/enlarged lymph nodes in her neck.  Past Medical History:  Diagnosis Date  . Acute bronchitis   . Anaplastic large cell lymphoma 2014   remission since 2015  . Asthma   . Cancer ([place])    lung and kidney  . Colon polyps 02/2013   Per colonoscopy  . Coma ([place])   . Diabetes mellitus without complication ([place])   . Diverticulosis 02/2013   Per colonoscopy  . Dysrhythmia   . Former light tobacco smoker   . GERD (gastroesophageal reflux disease)   . H/O stem cell transplant ([place]) 2015  . Hyperlipidemia   . Nerve pain    Left leg  . Non Hodgkin's lymphoma ([place])   . Seasonal allergies    Past Surgical History:  Procedure Laterality Date  . bones removed from Baby toes    . COLONOSCOPY  02/16/2012   Procedure: COLONOSCOPY;  Surgeon: Daneil Dolin, MD;  [facility];  Service: Endoscopy;  Laterality: N/A;  1:30  . COLONOSCOPY N/A 03/26/2017   Procedure: COLONOSCOPY;  Surgeon:  Daneil Dolin, MD;  [facility];  Service: Endoscopy;  Laterality: N/A;  930  . MEDIASTINOSCOPY N/A 05/13/2013   Procedure: MEDIASTINOSCOPY;  Surgeon: [place] Nakayama, MD;  Location: [facility];  Service: Thoracic;  Laterality: N/A;  . PARTIAL HYSTERECTOMY    . VAGINAL HYSTERECTOMY     partial hyst   . VIDEO BRONCHOSCOPY WITH ENDOBRONCHIAL ULTRASOUND N/A 05/13/2013   Procedure: VIDEO BRONCHOSCOPY WITH ENDOBRONCHIAL ULTRASOUND;  Surgeon: [place] Nakayama, MD;  Location: [facility];  Service: Thoracic;  Laterality: N/A;      Allergies: Glucosamine, Iodine, and Shellfish-derived products  Medications: Prior to Admission medications   Medication Sig Start Date End Date Taking? Authorizing Provider  albuterol (PROVENTIL HFA;VENTOLIN HFA) 108 (90 Base) MCG/ACT inhaler Inhale 2 puffs into the lungs every 4 (four) hours as needed for wheezing or shortness of breath. 10/27/16   Orpah Greek, MD  amLODipine (NORVASC) 5 MG tablet Take 5 mg by mouth daily.    [provider]  metFORMIN (GLUCOPHAGE) 500 MG tablet Take by mouth 2 (two) times daily with a meal.    [provider]  metoprolol tartrate (LOPRESSOR) 25 MG tablet Take 25 mg by mouth 2 (two) times daily.     [provider]  metroNIDAZOLE (METROGEL) 0.75 % vaginal gel Place 1 Applicatorful vaginally at bedtime. Apply one applicatorful to vagina at bedtime for 5 days 08/08/19   Jonnie Kind, MD  montelukast (SINGULAIR) 10 MG tablet Take  10 mg by mouth at bedtime.    [provider]  predniSONE (DELTASONE) 20 MG tablet Take 3 tablets (60 mg total) by mouth daily with breakfast. 10/07/19   Wyatt Portela, MD  rosuvastatin (CRESTOR) 10 MG tablet Take 10 mg by mouth daily.    [provider]     Vital Signs: BP 117/78 (BP Location: Right Arm)   Pulse 93   Temp 99.5 F (37.5 C) (Oral)   Resp 18   SpO2 99%   Physical Exam awake, alert.  Chest clear to auscultation  bilaterally.  Heart with regular rate and rhythm.  Abdomen obese, soft, positive bowel sounds, nontender.  No lower extremity edema.  Cervical adenopathy noted.  Imaging: No results found.  Labs:  CBC: Recent Labs    09/02/19 1007  WBC 5.5  HGB 12.5  HCT 40.0  PLT 169    COAGS: No results for input(s): INR, APTT in the last 8760 hours.  BMP: Recent Labs    09/02/19 1007  NA 142  K 4.2  CL 107  CO2 28  GLUCOSE 141*  BUN 12  CALCIUM 9.3  CREATININE 0.94  GFRNONAA >60  GFRAA >60    LIVER FUNCTION TESTS: Recent Labs    09/02/19 1007  BILITOT 0.5  AST 17  ALT 17  ALKPHOS 49  PROT 7.0  ALBUMIN 4.0    Assessment and Plan: Pt with history of anaplastic large T-cell lymphoma initially diagnosed in 2014, status post chemotherapy as well as stem cell transplant in 2015.  Recent CT scans of chest and neck on 10/07/2019 revealed new enlarged left supraclavicular lymph node/enlarged lymph nodes in the left neck concerning for lymphoma recurrence.  She presents today for image guided left cervical lymph node biopsy for further evaluation.Risks and benefits of procedure was discussed with the patient  including, but not limited to bleeding, infection, damage to adjacent structures or low yield requiring additional tests.  All of the questions were answered and there is agreement to proceed.  Consent signed and in chart.   LABS PENDING   Electronically Signed: D. Rowe Robert, PA-C 10/31/2019, 11:42 AM   I spent a total of 20 minutes at the the patient's bedside AND on the patient's hospital floor or unit, greater than 50% of which was counseling/coordinating care for image guided left cervical lymph node biopsy

## 2019-10-31 NOTE — Discharge Instructions (Signed)
Moderate Conscious Sedation, Adult, Care After °These instructions provide you with information about caring for yourself after your procedure. Your health care provider may also give you more specific instructions. Your treatment has been planned according to current medical practices, but problems sometimes occur. Call your health care provider if you have any problems or questions after your procedure. °What can I expect after the procedure? °After your procedure, it is common: °· To feel sleepy for several hours. °· To feel clumsy and have poor balance for several hours. °· To have poor judgment for several hours. °· To vomit if you eat too soon. °Follow these instructions at home: °For at least 24 hours after the procedure: ° °· Do not: °? Participate in activities where you could fall or become injured. °? Drive. °? Use heavy machinery. °? Drink alcohol. °? Take sleeping pills or medicines that cause drowsiness. °? Make important decisions or sign legal documents. °? Take care of children on your own. °· Rest. °Eating and drinking °· Follow the diet recommended by your health care provider. °· If you vomit: °? Drink water, juice, or soup when you can drink without vomiting. °? Make sure you have little or no nausea before eating solid foods. °General instructions °· Have a responsible adult stay with you until you are awake and alert. °· Take over-the-counter and prescription medicines only as told by your health care provider. °· If you smoke, do not smoke without supervision. °· Keep all follow-up visits as told by your health care provider. This is important. °Contact a health care provider if: °· You keep feeling nauseous or you keep vomiting. °· You feel light-headed. °· You develop a rash. °· You have a fever. °Get help right away if: °· You have trouble breathing. °This information is not intended to replace advice given to you by your health care provider. Make sure you discuss any questions you have  with your health care provider. °Document Released: 09/21/2013 Document Revised: 11/13/2017 Document Reviewed: 03/22/2016 °Elsevier Patient Education © 2020 [employer] ° °

## 2019-11-01 LAB — GLUCOSE, CAPILLARY: Glucose-Capillary: 93 mg/dL (ref 70–99)

## 2019-11-02 ENCOUNTER — Other Ambulatory Visit: Payer: Self-pay | Admitting: Oncology

## 2019-11-02 ENCOUNTER — Telehealth: Payer: Self-pay

## 2019-11-02 DIAGNOSIS — C8460 Anaplastic large cell lymphoma, ALK-positive, unspecified site: Secondary | ICD-10-CM

## 2019-11-02 LAB — SURGICAL PATHOLOGY

## 2019-11-02 NOTE — Telephone Encounter (Signed)
Per Dr. Alen Blew, called and set up an appointment for patient to be seen by Dr. Alver Sorrow at [facility] for an earlier appointment than yearly follow-up that is scheduled in February. Spoke to [place] and appointment scheduled for 11/23/2019 at 2:45pm.

## 2019-11-02 NOTE — Progress Notes (Signed)
The results of her cervical lymph node biopsy was discussed with Dr. Tresa Moore from hematopathology.  She reports that this is consistent with her previous histology of anaplastic large cell lymphoma that is ALK positive.  I attempted to reach Megan Mcknight on multiple occasions today to discuss this with her and to discuss next steps.  She will need a PET scan as well as referral back to [facility] where she received her high-dose chemotherapy and autologous stem cell transplant.  She will need evaluation regarding different salvage therapy at this time.  He is scheduled to have a follow-up with me next week but we will try to expedite for work-up as well as evaluation to start salvage therapy as soon as possible.

## 2019-11-09 ENCOUNTER — Other Ambulatory Visit: Payer: Self-pay

## 2019-11-09 ENCOUNTER — Inpatient Hospital Stay: Payer: BC Managed Care – PPO | Attending: Oncology | Admitting: Oncology

## 2019-11-09 VITALS — BP 107/64 | HR 101 | Temp 98.7°F | Resp 18 | Ht 68.0 in | Wt 232.6 lb

## 2019-11-09 DIAGNOSIS — C8460 Anaplastic large cell lymphoma, ALK-positive, unspecified site: Secondary | ICD-10-CM

## 2019-11-09 DIAGNOSIS — R634 Abnormal weight loss: Secondary | ICD-10-CM | POA: Diagnosis not present

## 2019-11-09 DIAGNOSIS — Z9221 Personal history of antineoplastic chemotherapy: Secondary | ICD-10-CM | POA: Diagnosis not present

## 2019-11-09 DIAGNOSIS — Z79899 Other long term (current) drug therapy: Secondary | ICD-10-CM | POA: Insufficient documentation

## 2019-11-09 DIAGNOSIS — Z8572 Personal history of non-Hodgkin lymphomas: Secondary | ICD-10-CM | POA: Diagnosis present

## 2019-11-09 DIAGNOSIS — Z7984 Long term (current) use of oral hypoglycemic drugs: Secondary | ICD-10-CM | POA: Insufficient documentation

## 2019-11-09 NOTE — Progress Notes (Signed)
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  She developed a relapse in November of 2020.   PRIOR THERAPY:  CHOEP q 21 days started on 05/20/2013.  Completed 6 of 6 planned cycles for CHOEP on 09/05/2013.  Cycle #5 was complicated by ICU-level care secondary to severe hyperglycemia (see prior note for details).  Cycle #6, prednisone was excluded.  She received neulasta after each cycle of therapy.   She underwent autologous SCT on February 4th 2015 at [facility] ([address] [place]).  She has not received any additional therapy after that after achieving remission.  CURRENT THERAPY: Under evaluation for possible salvage therapy.  INTERVAL HISTORY: Ms. Shafer is here for a follow-up visit.  Since the last visit, she underwent biopsy of her cervical adenopathy on 10/31/2019.  She reports no complications related to the procedure and has few constitutional symptoms including occasional night sweats but no fevers or chills.  She did have weight loss close to 20 pounds predominantly related to anxiety related to diagnosis.  She denies any abdominal pain or early satiety.  She denies any other painful adenopathy.   Patient denied any alteration mental status, neuropathy, confusion or dizziness.  Denies any headaches or lethargy.  Denies any night sweats, weight loss or changes in appetite.  Denied orthopnea, dyspnea on exertion or chest discomfort.  Denies shortness of breath, difficulty breathing hemoptysis or cough.  Denies any abdominal distention, nausea, early satiety or dyspepsia.  Denies any hematuria, frequency, dysuria or nocturia.  Denies any skin irritation, dryness or rash.  Denies any ecchymosis or petechiae.  Denies any lymphadenopathy or clotting.  Denies any heat or cold  intolerance.  Denies any anxiety or depression.  Remaining review of system is negative.           ALLERGIES:  is allergic to iodine and shellfish-derived products.  MEDICATIONS: Reviewed today without any changes.  Albuterol, amlodipine, metformin, metoprolol tartrate, metronidazole, montelukast, and rosuvastatin.    PHYSICAL EXAMINATION:  Blood pressure 107/64, pulse (!) 101, temperature 98.7 F (37.1 C), temperature source Temporal, resp. rate 18, height _0  (1.727 m), weight 232 lb 9.6 oz (105.5 kg), SpO2 97 %.     ECOG 0    General appearance: Alert, awake without any distress. Head: Atraumatic without abnormalities Oropharynx: Without any thrush or ulcers. Eyes: No scleral icterus. Lymph nodes:  Palpable cervical or supraclavicular lymphadenopathy noted. Heart:regular rate and rhythm, without any murmurs or gallops.   Lung: Clear to auscultation without any rhonchi, wheezes or dullness to percussion. Abdomin: Soft, nontender without any shifting dullness or ascites. Musculoskeletal: No clubbing or cyanosis. Neurological: No motor or sensory deficits. Skin: No rashes or lesions. Psychiatric: Mood and affect appeared normal.        Labs:  Lab Results  Component Value Date   WBC 3.0 (L) 10/31/2019   HGB 10.5 (L) 10/31/2019   HCT 34.2 (L) 10/31/2019   MCV 85.9 10/31/2019   PLT 167 10/31/2019   NEUTROABS 1.6 (L) 10/31/2019      Chemistry      Component Value Date/Time   NA 137 10/31/2019 1224   NA 144 09/04/2017 0916   K 3.9 10/31/2019 1224   K 4.1 09/04/2017 0916   CL 105 10/31/2019 1224   CO2 23 10/31/2019 1224   CO2 26 09/04/2017  0916   BUN 10 10/31/2019 1224   BUN 15.6 09/04/2017 0916   CREATININE 0.92 10/31/2019 1224   CREATININE 0.94 09/02/2019 1007   CREATININE 1.0 09/04/2017 0916      Component Value Date/Time   CALCIUM 8.8 (L) 10/31/2019 1224   CALCIUM 9.5 09/04/2017 0916   ALKPHOS 49 09/02/2019 1007   ALKPHOS 49 09/04/2017  0916   AST 17 09/02/2019 1007   AST 19 09/04/2017 0916   ALT 17 09/02/2019 1007   ALT 18 09/04/2017 0916   BILITOT 0.5 09/02/2019 1007   BILITOT 0.49 09/04/2017 0916       ASSESSMENT AND PLAN:    58 year old woman with:    1.  T-cell lymphoma diagnosed in 2014.  She was found to have anaplastic large cell and status post therapy outlined above.  She underwent a biopsy utilizing ultrasound of the left supraclavicular lymph node which confirmed the presence of anaplastic large T-cell lymphoma consistent with her original diagnosis.  The natural course of this disease was reviewed different salvage therapy options were discussed.  Given the fact that she has been treated reports [facility] and the rarity of this disorder I recommended an evaluation at [facility] in the immediate future with an appointment already set up.  Her PET scan is currently pending which will help to complete the staging.  Bone marrow biopsy might be also needed but will be deferred till her evaluation at [facility].  2.  Weight loss: We discussed strategies to improve her overall appetite and nutritional intake.  Encouraged adequate hydration to maintain reasonable kidney function.   3. Follow-up: Will be after her evaluation at [facility].  25  minutes was spent with the patient face-to-face today.  More than 50% of time was dedicated to reviewing imaging studies, differential diagnosis and management options.    Zola Button MD 11/09/19

## 2019-11-11 ENCOUNTER — Telehealth: Payer: Self-pay | Admitting: Oncology

## 2019-11-11 NOTE — Telephone Encounter (Signed)
Scheduled per los. Called and spoke with patient. Confirmed appt 

## 2019-11-21 ENCOUNTER — Other Ambulatory Visit: Payer: Self-pay

## 2019-11-21 ENCOUNTER — Ambulatory Visit (HOSPITAL_COMMUNITY)
Admission: RE | Admit: 2019-11-21 | Discharge: 2019-11-21 | Disposition: A | Discharge status: Home / Self Care | Payer: BC Managed Care – PPO | Source: Ambulatory Visit | Attending: Oncology | Admitting: Oncology

## 2019-11-21 DIAGNOSIS — C8460 Anaplastic large cell lymphoma, ALK-positive, unspecified site: Secondary | ICD-10-CM | POA: Diagnosis not present

## 2019-11-21 LAB — GLUCOSE, CAPILLARY: Glucose-Capillary: 104 mg/dL — ABNORMAL HIGH (ref 70–99)

## 2019-11-21 MED ORDER — FLUDEOXYGLUCOSE F - 18 (FDG) INJECTION
11.6700 | Freq: Once | INTRAVENOUS | Status: AC
  Administered 2019-11-21: 11.67 via INTRAVENOUS

## 2019-11-22 ENCOUNTER — Encounter (HOSPITAL_COMMUNITY): Admission: RE | Admit: 2019-11-22 | Payer: BC Managed Care – PPO | Source: Ambulatory Visit

## 2019-11-24 ENCOUNTER — Other Ambulatory Visit: Payer: Self-pay | Admitting: Oncology

## 2019-11-24 NOTE — Progress Notes (Signed)
START ON PATHWAY REGIMEN - Lymphoma and CLL     A cycle is every 3 weeks:     Brentuximab vedotin   **Always confirm dose/schedule in your pharmacy ordering system**  Patient Characteristics: T-Cell Lymphoma, Second Line, Transplant Candidate, CD30 Positive Disease Type: Not Applicable Disease Type: T-Cell Lymphoma Disease Type: Not Applicable Line of Therapy: Second Line [place] Stage: III Patient Characteristics: Transplant Candidate CD30 Status: CD30 Positive Intent of Therapy: Curative Intent, Discussed with Patient

## 2019-11-24 NOTE — Progress Notes (Signed)
DISCONTINUE ON PATHWAY REGIMEN - Lymphoma and CLL     A cycle is every 3 weeks:     Brentuximab vedotin   **Always confirm dose/schedule in your pharmacy ordering system**  REASON: Disease Progression PRIOR TREATMENT: [id number]: Brentuximab 1.8 mg/kg q3 Weeks Followed by a Referral to Transplant (Treat Until Maximum Benefit or Unacceptable Toxicity) TREATMENT RESPONSE: Complete Response (CR)  START ON PATHWAY REGIMEN - Lymphoma and CLL     A cycle is every 3 weeks:     Brentuximab vedotin   **Always confirm dose/schedule in your pharmacy ordering system**  Patient Characteristics: T-Cell Lymphoma, Second Line, Transplant Candidate, CD30 Positive Disease Type: Not Applicable Disease Type: T-Cell Lymphoma Disease Type: Not Applicable Line of Therapy: Second Line [place] Stage: III Patient Characteristics: Transplant Candidate CD30 Status: CD30 Positive Intent of Therapy: Curative Intent, Discussed with Patient

## 2019-11-28 ENCOUNTER — Inpatient Hospital Stay: Payer: BC Managed Care – PPO

## 2019-11-28 ENCOUNTER — Ambulatory Visit: Payer: BC Managed Care – PPO

## 2019-11-28 ENCOUNTER — Telehealth: Payer: Self-pay | Admitting: Oncology

## 2019-11-28 ENCOUNTER — Other Ambulatory Visit: Payer: Self-pay

## 2019-11-28 ENCOUNTER — Inpatient Hospital Stay: Payer: BC Managed Care – PPO | Attending: Oncology | Admitting: Oncology

## 2019-11-28 VITALS — BP 125/74 | HR 122 | Temp 98.5°F | Wt 221.4 lb

## 2019-11-28 DIAGNOSIS — R000 Tachycardia, unspecified: Secondary | ICD-10-CM | POA: Diagnosis not present

## 2019-11-28 DIAGNOSIS — R634 Abnormal weight loss: Secondary | ICD-10-CM | POA: Insufficient documentation

## 2019-11-28 DIAGNOSIS — E1140 Type 2 diabetes mellitus with diabetic neuropathy, unspecified: Secondary | ICD-10-CM | POA: Insufficient documentation

## 2019-11-28 DIAGNOSIS — R5081 Fever presenting with conditions classified elsewhere: Secondary | ICD-10-CM | POA: Insufficient documentation

## 2019-11-28 DIAGNOSIS — Z87891 Personal history of nicotine dependence: Secondary | ICD-10-CM | POA: Insufficient documentation

## 2019-11-28 DIAGNOSIS — R590 Localized enlarged lymph nodes: Secondary | ICD-10-CM | POA: Insufficient documentation

## 2019-11-28 DIAGNOSIS — Z79899 Other long term (current) drug therapy: Secondary | ICD-10-CM | POA: Diagnosis not present

## 2019-11-28 DIAGNOSIS — C8460 Anaplastic large cell lymphoma, ALK-positive, unspecified site: Secondary | ICD-10-CM

## 2019-11-28 DIAGNOSIS — R0602 Shortness of breath: Secondary | ICD-10-CM | POA: Insufficient documentation

## 2019-11-28 DIAGNOSIS — R5381 Other malaise: Secondary | ICD-10-CM | POA: Diagnosis not present

## 2019-11-28 DIAGNOSIS — E860 Dehydration: Secondary | ICD-10-CM | POA: Insufficient documentation

## 2019-11-28 DIAGNOSIS — E785 Hyperlipidemia, unspecified: Secondary | ICD-10-CM | POA: Diagnosis not present

## 2019-11-28 DIAGNOSIS — Z7984 Long term (current) use of oral hypoglycemic drugs: Secondary | ICD-10-CM | POA: Diagnosis not present

## 2019-11-28 DIAGNOSIS — R0609 Other forms of dyspnea: Secondary | ICD-10-CM | POA: Diagnosis not present

## 2019-11-28 DIAGNOSIS — R5383 Other fatigue: Secondary | ICD-10-CM | POA: Insufficient documentation

## 2019-11-28 DIAGNOSIS — K219 Gastro-esophageal reflux disease without esophagitis: Secondary | ICD-10-CM | POA: Insufficient documentation

## 2019-11-28 DIAGNOSIS — Z5112 Encounter for antineoplastic immunotherapy: Secondary | ICD-10-CM | POA: Diagnosis not present

## 2019-11-28 DIAGNOSIS — Z8249 Family history of ischemic heart disease and other diseases of the circulatory system: Secondary | ICD-10-CM | POA: Diagnosis not present

## 2019-11-28 LAB — CBC WITH DIFFERENTIAL (CANCER CENTER ONLY)
Abs Immature Granulocytes: 0.04 10*3/uL (ref 0.00–0.07)
Basophils Absolute: 0 10*3/uL (ref 0.0–0.1)
Basophils Relative: 0 %
Eosinophils Absolute: 0 10*3/uL (ref 0.0–0.5)
Eosinophils Relative: 0 %
HCT: 30.7 % — ABNORMAL LOW (ref 36.0–46.0)
Hemoglobin: 9.7 g/dL — ABNORMAL LOW (ref 12.0–15.0)
Immature Granulocytes: 1 %
Lymphocytes Relative: 12 %
Lymphs Abs: 1 10*3/uL (ref 0.7–4.0)
MCH: 26.2 pg (ref 26.0–34.0)
MCHC: 31.6 g/dL (ref 30.0–36.0)
MCV: 83 fL (ref 80.0–100.0)
Monocytes Absolute: 0.6 10*3/uL (ref 0.1–1.0)
Monocytes Relative: 7 %
Neutro Abs: 6.5 10*3/uL (ref 1.7–7.7)
Neutrophils Relative %: 80 %
Platelet Count: 247 10*3/uL (ref 150–400)
RBC: 3.7 MIL/uL — ABNORMAL LOW (ref 3.87–5.11)
RDW: 14 % (ref 11.5–15.5)
WBC Count: 8.2 10*3/uL (ref 4.0–10.5)
nRBC: 0 % (ref 0.0–0.2)

## 2019-11-28 LAB — CMP (CANCER CENTER ONLY)
ALT: 27 U/L (ref 0–44)
AST: 29 U/L (ref 15–41)
Albumin: 2.6 g/dL — ABNORMAL LOW (ref 3.5–5.0)
Alkaline Phosphatase: 46 U/L (ref 38–126)
Anion gap: 11 (ref 5–15)
BUN: 10 mg/dL (ref 6–20)
CO2: 26 mmol/L (ref 22–32)
Calcium: 9.2 mg/dL (ref 8.9–10.3)
Chloride: 99 mmol/L (ref 98–111)
Creatinine: 0.81 mg/dL (ref 0.44–1.00)
GFR, Est AFR Am: >60 mL/min (ref >60)
GFR, Estimated: >60 mL/min (ref >60)
Glucose, Bld: 108 mg/dL — ABNORMAL HIGH (ref 70–99)
Potassium: 4.3 mmol/L (ref 3.5–5.1)
Sodium: 136 mmol/L (ref 135–145)
Total Bilirubin: 0.7 mg/dL (ref 0.3–1.2)
Total Protein: 7.6 g/dL (ref 6.5–8.1)

## 2019-11-28 LAB — SAMPLE TO BLOOD BANK

## 2019-11-28 MED ORDER — SODIUM CHLORIDE 0.9 % IV SOLN
INTRAVENOUS | Status: DC
  Administered 2019-11-28: 10:00:00 via INTRAVENOUS
  Filled 2019-11-28 (×2): qty 250

## 2019-11-28 NOTE — Patient Instructions (Signed)
Dehydration, Adult  Dehydration is a condition in which there is not enough fluid or water in the body. This happens when you lose more fluids than you take in. Important organs, such as the kidneys, brain, and heart, cannot function without a proper amount of fluids. Any loss of fluids from the body can lead to dehydration. Dehydration can range from mild to severe. This condition should be treated right away to prevent it from becoming severe. What are the causes? This condition may be caused by:  Vomiting.  Diarrhea.  Excessive sweating, such as from heat exposure or exercise.  Not drinking enough fluid, especially: ? When ill. ? While doing activity that requires a lot of energy.  Excessive urination.  Fever.  Infection.  Certain medicines, such as medicines that cause the body to lose excess fluid (diuretics).  Inability to access safe drinking water.  Reduced physical ability to get adequate water and food. What increases the risk? This condition is more likely to develop in people:  Who have a poorly controlled long-term (chronic) illness, such as diabetes, heart disease, or kidney disease.  Who are age 38 or older.  Who are disabled.  Who live in a place with high altitude.  Who play endurance sports. What are the signs or symptoms? Symptoms of mild dehydration may include:  Thirst.  Dry lips.  Slightly dry mouth.  Dry, warm skin.  Dizziness. Symptoms of moderate dehydration may include:  Very dry mouth.  Muscle cramps.  Dark urine. Urine may be the color of tea.  Decreased urine production.  Decreased tear production.  Heartbeat that is irregular or faster than normal (palpitations).  Headache.  Light-headedness, especially when you stand up from a sitting position.  Fainting (syncope). Symptoms of severe dehydration may include:  Changes in skin, such as: ? Cold and clammy skin. ? Blotchy (mottled) or pale skin. ? Skin that does  not quickly return to normal after being lightly pinched and released (poor skin turgor).  Changes in body fluids, such as: ? Extreme thirst. ? No tear production. ? Inability to sweat when body temperature is high, such as in hot weather. ? Very little urine production.  Changes in vital signs, such as: ? Weak pulse. ? Pulse that is more than 100 beats a minute when sitting still. ? Rapid breathing. ? Low blood pressure.  Other changes, such as: ? Sunken eyes. ? Cold hands and feet. ? Confusion. ? Lack of energy (lethargy). ? Difficulty waking up from sleep. ? Short-term weight loss. ? Unconsciousness. How is this diagnosed? This condition is diagnosed based on your symptoms and a physical exam. Blood and urine tests may be done to help confirm the diagnosis. How is this treated? Treatment for this condition depends on the severity. Mild or moderate dehydration can often be treated at home. Treatment should be started right away. Do not wait until dehydration becomes severe. Severe dehydration is an emergency and it needs to be treated in a hospital. Treatment for mild dehydration may include:  Drinking more fluids.  Replacing salts and minerals in your blood (electrolytes) that you may have lost. Treatment for moderate dehydration may include:  Drinking an oral rehydration solution (ORS). This is a drink that helps you replace fluids and electrolytes (rehydrate). It can be found at pharmacies and retail stores. Treatment for severe dehydration may include:  Receiving fluids through an IV tube.  Receiving an electrolyte solution through a feeding tube that is passed through your nose and  into your stomach (nasogastric tube, or NG tube).  Correcting any abnormalities in electrolytes.  Treating the underlying cause of dehydration. Follow these instructions at home:  If directed by your health care provider, drink an ORS: ? Make an ORS by following instructions on the  package. ? Start by drinking small amounts, about  cup (120 mL) every 5-10 minutes. ? Slowly increase how much you drink until you have taken the amount recommended by your health care provider.  Drink enough clear fluid to keep your urine clear or pale yellow. If you were told to drink an ORS, finish the ORS first, then start slowly drinking other clear fluids. Drink fluids such as: ? Water. Do not drink only water. Doing that can lead to having too little salt (sodium) in the body (hyponatremia). ? Ice chips. ? Fruit juice that you have added water to (diluted fruit juice). ? Low-calorie sports drinks.  Avoid: ? Alcohol. ? Drinks that contain a lot of sugar. These include high-calorie sports drinks, fruit juice that is not diluted, and soda. ? Caffeine. ? Foods that are greasy or contain a lot of fat or sugar.  Take over-the-counter and prescription medicines only as told by your health care provider.  Do not take sodium tablets. This can lead to having too much sodium in the body (hypernatremia).  Eat foods that contain a healthy balance of electrolytes, such as bananas, oranges, potatoes, tomatoes, and spinach.  Keep all follow-up visits as told by your health care provider. This is important. Contact a health care provider if:  You have abdominal pain that: ? Gets worse. ? Stays in one area (localizes).  You have a rash.  You have a stiff neck.  You are more irritable than usual.  You are sleepier or more difficult to wake up than usual.  You feel weak or dizzy.  You feel very thirsty.  You have urinated only a small amount of very dark urine over 6-8 hours. Get help right away if:  You have symptoms of severe dehydration.  You cannot drink fluids without vomiting.  Your symptoms get worse with treatment.  You have a fever.  You have a severe headache.  You have vomiting or diarrhea that: ? Gets worse. ? Does not go away.  You have blood or green matter  (bile) in your vomit.  You have blood in your stool. This may cause stool to look black and tarry.  You have not urinated in 6-8 hours.  You faint.  Your heart rate while sitting still is over 100 beats a minute.  You have trouble breathing. This information is not intended to replace advice given to you by your health care provider. Make sure you discuss any questions you have with your health care provider. Document Released: 12/01/2005 Document Revised: 11/13/2017 Document Reviewed: 01/25/2016 Elsevier Patient Education  2020 [employer].

## 2019-11-28 NOTE — Telephone Encounter (Signed)
Scheduled appt per 12/10 sch message - pt received updated schedule today

## 2019-11-28 NOTE — Progress Notes (Signed)
[facility] ONCOLOGY OFFICE PROGRESS NOTE 11/28/19   Rosita Fire, MD [address] [place] [address]  DIAGNOSIS: 58 year old woman with T-cell lymphoma diagnosed in 2014.  She was found to have CD30 and ALK positive, CD20 negative anaplastic large T-cell lymphoma lymphoma with relapsed disease in 2020.  Her tumor again is CD30 positive documented on 10/31/2019.  PRIOR THERAPY:  CHOEP q 21 days started on 05/20/2013.  Completed 6 of 6 planned cycles for CHOEP on 09/05/2013.  Cycle #5 was complicated by ICU-level care secondary to severe hyperglycemia (see prior note for details).  Cycle #6, prednisone was excluded.  She received neulasta after each cycle of therapy.   She underwent autologous SCT on February 4th 2015 at [facility] ([address] [place]).  She has not received any additional therapy after that after achieving remission.  CURRENT THERAPY: Adcetris 1.8 mg/kg every 3 weeks.  Cycle 1 scheduled for 12/01/2019.  INTERVAL HISTORY: Ms. Fuster returns today for repeat evaluation.  Since the last visit, she was evaluated at [facility] for relapsed lymphoma and recommended to start Adcetris in the near future.  She might require additional therapy in the future.  Clinically, she reports more symptoms and more functional decline since the last visit.  She has reported dyspnea on exertion but no cough or fevers.  She has reported excessive fatigue and tiredness.  Her p.o. intake continues to decline of lost more weight.  She is relying on her son for performing a lot of activities of daily living.   She denied headaches, blurry vision, syncope or seizures.  Denies any fevers, chills or sweats.  Denied chest pain, palpitation, orthopnea or leg edema.  Denied cough, wheezing or hemoptysis.  Denied nausea, vomiting or abdominal pain.  Denies any constipation or diarrhea.  Denies any frequency urgency or hesitancy.  Denies any arthralgias or myalgias.   Denies any skin rashes or lesions.  Denies any bleeding or clotting tendency.  Denies any easy bruising.  Denies any hair or nail changes.  Denies any anxiety or depression.  Remaining review of system is negative.            ALLERGIES:  is allergic to iodine and shellfish-derived products.  MEDICATIONS: Updated today without any changes.  Albuterol, amlodipine, metformin, metoprolol tartrate, metronidazole, montelukast, and rosuvastatin.    PHYSICAL EXAMINATION:  Blood pressure 125/74, pulse (!) 122, temperature 98.5 F (36.9 C), temperature source Temporal, weight 221 lb 6.4 oz (100.4 kg), SpO2 98 %.      ECOG 2    General appearance:  Chronically ill-appearing without any distress. Head: Normocephalic without any trauma Oropharynx: Mucous membranes are moist and pink without any thrush or ulcers. Eyes: Pupils are equal and round reactive to light. Lymph nodes: No cervical, supraclavicular, inguinal or axillary lymphadenopathy.   Heart:regular rate and rhythm.  S1 and S2 without leg edema. Lung: Clear without any rhonchi or wheezes.  No dullness to percussion. Abdomin: Soft, nontender, nondistended with good bowel sounds.  No hepatosplenomegaly. Musculoskeletal: No joint deformity or effusion.  Full range of motion noted. Neurological: No deficits noted on motor, sensory and deep tendon reflex exam. Skin: No petechial rash or dryness.  Appeared moist.  Psychiatric: Mood and affect appeared appropriate.         Labs:  Lab Results  Component Value Date   WBC 3.0 (L) 10/31/2019   HGB 10.5 (L) 10/31/2019   HCT 34.2 (L) 10/31/2019   MCV 85.9 10/31/2019  PLT 167 10/31/2019   NEUTROABS 1.6 (L) 10/31/2019      Chemistry      Component Value Date/Time   NA 137 10/31/2019 1224   NA 144 09/04/2017 0916   K 3.9 10/31/2019 1224   K 4.1 09/04/2017 0916   CL 105 10/31/2019 1224   CO2 23 10/31/2019 1224   CO2 26 09/04/2017 0916   BUN 10 10/31/2019 1224    BUN 15.6 09/04/2017 0916   CREATININE 0.92 10/31/2019 1224   CREATININE 0.94 09/02/2019 1007   CREATININE 1.0 09/04/2017 0916      Component Value Date/Time   CALCIUM 8.8 (L) 10/31/2019 1224   CALCIUM 9.5 09/04/2017 0916   ALKPHOS 49 09/02/2019 1007   ALKPHOS 49 09/04/2017 0916   AST 17 09/02/2019 1007   AST 19 09/04/2017 0916   ALT 17 09/02/2019 1007   ALT 18 09/04/2017 0916   BILITOT 0.5 09/02/2019 1007   BILITOT 0.49 09/04/2017 0916       ASSESSMENT AND PLAN:    58 year old woman with:    1.  CD30 positive T-cell lymphoma diagnosed in 2014 with relapsed disease and November 2020.   The natural course of this disease was reviewed again.  She also was evaluated at [facility] for a second opinion as well as future plan of care.  At this time the plan is to proceed with a salvage therapy utilizing Adcetris to try to get her into close to remission as possible and further evaluation for possible allogeneic stem cell transplant or CAR-T therapy.   Complication associated with this treatment to include nausea, vomiting, myelosuppression, neutropenia, worsening neuropathy as well as infusion related complications.  Plan to start in the immediate future for cycle scheduled on 12/01/2019.  He is agreeable to proceed.  2.  IV access: Risks and benefits of the Port-A-Cath insertion versus using her peripheral veins were reviewed.  Port-A-Cath will be inserted in the next 2 weeks.  3.  Weight loss: Related to her lymphoma.  We continues to encourage nutritional supplements.  Ultimately treating her disease will result in improvement overall.  4.  Tachycardia and debilitation: Related to her disease relapse.  She appears to be mildly dehydrated at this time.  We will obtain laboratory testing today and will start with hydration in preparation for the start of chemotherapy in the next 2 days.  I will also check her electrolytes to make sure he does not really need any  urgent replacement.  Will check CBC for potential cytopenias and potential need for transfusion.   5/follow-up: On 1217 for cycle 1 of therapy in 3 weeks for evaluation prior to cycle 2.  25  minutes was spent with the patient face-to-face today.  More than 50% of time was spent on updating her disease status, discussing treatment options and outlining future plan of care.    Zola Button MD 11/28/19

## 2019-11-29 ENCOUNTER — Telehealth: Payer: Self-pay | Admitting: Oncology

## 2019-11-29 NOTE — Telephone Encounter (Signed)
No los per 12/14

## 2019-12-01 ENCOUNTER — Other Ambulatory Visit: Payer: Self-pay | Admitting: Medical

## 2019-12-01 ENCOUNTER — Other Ambulatory Visit: Payer: Self-pay

## 2019-12-01 ENCOUNTER — Inpatient Hospital Stay: Payer: BC Managed Care – PPO

## 2019-12-01 ENCOUNTER — Other Ambulatory Visit: Payer: Self-pay | Admitting: Oncology

## 2019-12-01 ENCOUNTER — Inpatient Hospital Stay (HOSPITAL_BASED_OUTPATIENT_CLINIC_OR_DEPARTMENT_OTHER): Payer: BC Managed Care – PPO | Admitting: Medical

## 2019-12-01 VITALS — BP 129/74 | HR 123 | Temp 102.0°F | Resp 36 | Wt 204.2 lb

## 2019-12-01 DIAGNOSIS — C8460 Anaplastic large cell lymphoma, ALK-positive, unspecified site: Secondary | ICD-10-CM

## 2019-12-01 DIAGNOSIS — R5081 Fever presenting with conditions classified elsewhere: Secondary | ICD-10-CM | POA: Diagnosis not present

## 2019-12-01 DIAGNOSIS — R062 Wheezing: Secondary | ICD-10-CM | POA: Diagnosis not present

## 2019-12-01 DIAGNOSIS — Z5112 Encounter for antineoplastic immunotherapy: Secondary | ICD-10-CM | POA: Diagnosis not present

## 2019-12-01 MED ORDER — AMOXICILLIN-POT CLAVULANATE 875-125 MG PO TABS: 1.0000 | ORAL_TABLET | Freq: Two times a day (BID) | ORAL | 0 refills | Status: DC

## 2019-12-01 MED ORDER — DEXAMETHASONE SODIUM PHOSPHATE 10 MG/ML IJ SOLN
INTRAMUSCULAR | Status: AC
  Filled 2019-12-01: qty 1

## 2019-12-01 MED ORDER — SODIUM CHLORIDE 0.9 % IV SOLN
Freq: Once | INTRAVENOUS | Status: AC
  Filled 2019-12-01: qty 250

## 2019-12-01 MED ORDER — DIPHENHYDRAMINE HCL 50 MG/ML IJ SOLN
INTRAMUSCULAR | Status: AC
  Filled 2019-12-01: qty 1

## 2019-12-01 MED ORDER — SODIUM CHLORIDE 0.9 % IV SOLN: 10.0000 mg | Freq: Once | INTRAVENOUS | Status: DC

## 2019-12-01 MED ORDER — ACETAMINOPHEN 325 MG PO TABS
ORAL_TABLET | ORAL | Status: AC
  Filled 2019-12-01: qty 2

## 2019-12-01 MED ORDER — DIPHENHYDRAMINE HCL 50 MG/ML IJ SOLN
50.0000 mg | Freq: Once | INTRAMUSCULAR | Status: AC
  Administered 2019-12-01: 50 mg via INTRAVENOUS

## 2019-12-01 MED ORDER — DEXAMETHASONE SODIUM PHOSPHATE 10 MG/ML IJ SOLN
10.0000 mg | Freq: Once | INTRAMUSCULAR | Status: AC
  Administered 2019-12-01: 10 mg via INTRAVENOUS

## 2019-12-01 MED ORDER — SODIUM CHLORIDE 0.9 % IV SOLN
150.0000 mg | Freq: Once | INTRAVENOUS | Status: AC
  Administered 2019-12-01: 150 mg via INTRAVENOUS
  Filled 2019-12-01: qty 30

## 2019-12-01 MED ORDER — PROCHLORPERAZINE MALEATE 10 MG PO TABS: 10.0000 mg | ORAL_TABLET | Freq: Four times a day (QID) | ORAL | 0 refills | Status: DC | PRN

## 2019-12-01 MED ORDER — ACETAMINOPHEN 325 MG PO TABS
650.0000 mg | ORAL_TABLET | Freq: Once | ORAL | Status: AC
  Administered 2019-12-01: 650 mg via ORAL

## 2019-12-01 NOTE — Progress Notes (Signed)
Pt. arrived to treatment area. Vital signs taken, Temp 103.1 Heart rate = 138-140, Respirations= 40. Pt. denies complaints of chest pain, dizziness, and shortness of breath noted. Pt. denies complaints of pain. States she is "nervous" today about treatment and has had some nausea and emesis this AM, but has resided now. Lucianne Lei, [place] notified and in for observation/assessment. Dr. Alen Blew in for observation/assessment and states to go ahead with treatment today.

## 2019-12-01 NOTE — Patient Instructions (Signed)
[facility] Discharge Instructions for Patients Receiving Chemotherapy  Today you received the following Immunotherapy: Brentuximab  To help prevent nausea and vomiting after your treatment, we encourage you to take your nausea medication as directed by your MD.   If you develop nausea and vomiting that is not controlled by your nausea medication, call the clinic.   BELOW ARE SYMPTOMS THAT SHOULD BE REPORTED IMMEDIATELY:  *FEVER GREATER THAN 100.5 F  *CHILLS WITH OR WITHOUT FEVER  NAUSEA AND VOMITING THAT IS NOT CONTROLLED WITH YOUR NAUSEA MEDICATION  *UNUSUAL SHORTNESS OF BREATH  *UNUSUAL BRUISING OR BLEEDING  TENDERNESS IN MOUTH AND THROAT WITH OR WITHOUT PRESENCE OF ULCERS  *URINARY PROBLEMS  *BOWEL PROBLEMS  UNUSUAL RASH Items with * indicate a potential emergency and should be followed up as soon as possible.  Feel free to call the clinic should you have any questions or concerns. The clinic phone number is [phone].  Please show the [facility] at check-in to the Emergency Department and triage nurse.  Brentuximab vedotin solution for injection What is this medicine? BRENTUXIMAB VEDOTIN (bren TUX see mab ve DOE tin) is a monoclonal antibody and a chemotherapy drug. It is used for treating Hodgkin lymphoma and certain non-Hodgkin lymphomas, such as anaplastic large-cell lymphoma, mycosis fungoides, and peripheral T-cell lymphoma. This medicine may be used for other purposes; ask your health care provider or pharmacist if you have questions. COMMON BRAND NAME(S): ADCETRIS What should I tell my health care provider before I take this medicine? They need to know if you have any of these conditions:  immune system problems  infection (especially a virus infection such as chickenpox, cold sores, or herpes)  kidney disease  liver disease  low blood counts, like low white cell, platelet, or red cell counts  tingling of the fingers or toes, or  other nerve disorder  an unusual or allergic reaction to brentuximab vedotin, other medicines, foods, dyes, or preservatives  pregnant or trying to get pregnant  breast-feeding How should I use this medicine? This medicine is for infusion into a vein. It is given by a health care professional in a hospital or clinic setting. Talk to your pediatrician regarding the use of this medicine in children. Special care may be needed. Overdosage: If you think you have taken too much of this medicine contact a poison control center or emergency room at once. NOTE: This medicine is only for you. Do not share this medicine with others. What if I miss a dose? It is important not to miss your dose. Call your doctor or health care professional if you are unable to keep an appointment. What may interact with this medicine? This medicine may interact with the following medications:  ketoconazole  rifampin  St. John's wort; Hypericum perforatum This list may not describe all possible interactions. Give your health care provider a list of all the medicines, herbs, non-prescription drugs, or dietary supplements you use. Also tell them if you smoke, drink alcohol, or use illegal drugs. Some items may interact with your medicine. What should I watch for while using this medicine? Visit your doctor for checks on your progress. This drug may make you feel generally unwell. Report any side effects. Continue your course of treatment even though you feel ill unless your doctor tells you to stop. Call your doctor or health care professional for advice if you get a fever, chills or sore throat, or other symptoms of a cold or flu. Do not  treat yourself. This drug decreases your body's ability to fight infections. Try to avoid being around people who are sick. This medicine may increase your risk to bruise or bleed. Call your doctor or health care professional if you notice any unusual bleeding. In some patients, this  medicine may cause a serious brain infection that may cause death. If you have any problems seeing, thinking, speaking, walking, or standing, tell your doctor right away. If you cannot reach your doctor, urgently seek other source of medical care. Do not become pregnant while taking this medicine or for 6 months after stopping it. Women should inform their doctor if they wish to become pregnant or think they might be pregnant. Men should not father a child while taking this medicine and for 6 months after stopping it. There is a potential for serious side effects to an unborn child. Talk to your health care professional or pharmacist for more information. Do not breast-feed an infant while taking this medicine. This may interfere with the ability to father a child. You should talk to your doctor or health care professional if you are concerned about your fertility. What side effects may I notice from receiving this medicine? Side effects that you should report to your doctor or health care professional as soon as possible:  allergic reactions like skin rash, itching or hives, swelling of the face, lips, or tongue  changes in emotions or moods  diarrhea  low blood counts - this medicine may decrease the number of white blood cells, red blood cells and platelets. You may be at increased risk for infections and bleeding.  pain, tingling, numbness in the hands or feet  redness, blistering, peeling or loosening of the skin, including inside the mouth  shortness of breath  signs of infection - fever or chills, cough, sore throat, pain or difficulty passing urine  signs of decreased platelets or bleeding - bruising, pinpoint red spots on the skin, black, tarry stools, blood in the urine  signs of decreased red blood cells - unusually weak or tired, fainting spells, lightheadedness  signs of liver injury like dark yellow or brown urine; general ill feeling or flu-like symptoms; light-colored  stools; loss of appetite; nausea; right upper belly pain; yellowing of the eyes or skin  stomach pain  sudden numbness or weakness of the face, arm or leg  vomiting Side effects that usually do not require medical attention (report to your doctor or health care professional if they continue or are bothersome):  constipation  dizziness  headache  muscle pain  tiredness This list may not describe all possible side effects. Call your doctor for medical advice about side effects. You may report side effects to FDA at 1-800-FDA-1088. Where should I keep my medicine? This drug is given in a hospital or clinic and will not be stored at home. NOTE: This sheet is a summary. It may not cover all possible information. If you have questions about this medicine, talk to your doctor, pharmacist, or health care provider.  2020 Elsevier/Gold Standard (2017-11-02 14:10:02)  Coronavirus (COVID-19) Are you at risk?  Are you at risk for the Coronavirus (COVID-19)?  To be considered HIGH RISK for Coronavirus (COVID-19), you have to meet the following criteria:  . Traveled to [place], [place], [place], [place] or [place]; or in the [place] to [place], , [place], or [place]; and have fever, cough, and shortness of breath within the last 2 weeks of travel OR . Been in  close contact with a person diagnosed with COVID-19 within the last 2 weeks and have fever, cough, and shortness of breath . IF YOU DO NOT MEET THESE CRITERIA, YOU ARE CONSIDERED LOW RISK FOR COVID-19.  What to do if you are HIGH RISK for COVID-19?  Marland Kitchen If you are having a medical emergency, call 911. . Seek medical care right away. Before you go to a doctor's office, urgent care or emergency department, call ahead and tell them about your recent travel, contact with someone diagnosed with COVID-19, and your symptoms. You should receive instructions from your physician's office regarding next steps of care.  . When you  arrive at healthcare provider, tell the healthcare staff immediately you have returned from visiting [place], [place], [place], [place] or [place]; or traveled in the [place] to [place], [place], [place], or [place]; in the last two weeks or you have been in close contact with a person diagnosed with COVID-19 in the last 2 weeks.   . Tell the health care staff about your symptoms: fever, cough and shortness of breath. . After you have been seen by a medical provider, you will be either: o Tested for (COVID-19) and discharged home on quarantine except to seek medical care if symptoms worsen, and asked to  - Stay home and avoid contact with others until you get your results (4-5 days)  - Avoid travel on public transportation if possible (such as bus, train, or airplane) or o Sent to the Emergency Department by EMS for evaluation, COVID-19 testing, and possible admission depending on your condition and test results.  What to do if you are LOW RISK for COVID-19?  Reduce your risk of any infection by using the same precautions used for avoiding the common cold or flu:  Marland Kitchen Wash your hands often with soap and warm water for at least 20 seconds.  If soap and water are not readily available, use an alcohol-based hand sanitizer with at least 60% alcohol.  . If coughing or sneezing, cover your mouth and nose by coughing or sneezing into the elbow areas of your shirt or coat, into a tissue or into your sleeve (not your hands). . Avoid shaking hands with others and consider head nods or verbal greetings only. . Avoid touching your eyes, nose, or mouth with unwashed hands.  . Avoid close contact with people who are sick. . Avoid places or events with large numbers of people in one location, like concerts or sporting events. . Carefully consider travel plans you have or are making. . If you are planning any travel outside or inside the [place], visit the CDC's Travelers' Health webpage for the latest health  notices. . If you have some symptoms but not all symptoms, continue to monitor at home and seek medical attention if your symptoms worsen. . If you are having a medical emergency, call 911.   [facility] / e-Visit: [email or web address]         [facility]: [facility]: [phone]                   [facility]: [phone]

## 2019-12-06 NOTE — Progress Notes (Signed)
Symptoms Management Clinic Progress Note   DARYAN CAGLEY [id number] Jul 26, 1961 58 y.o.  [name] is managed by Dr. Zola Button  Actively treated with chemotherapy/immunotherapy/hormonal therapy: yes  Current therapy: Prednisone and Adcetris  Last treated:12/01/2019 (cycle #1)  Next scheduled appointment with provider: 12/22/2019  Assessment: Plan:    Fever in other diseases  Wheezing   Fever suspected secondary to T-cell lymphoma: The patient was seen by Dr. Alen Blew who recommends proceeding with treatment today.  The patient will receive cycle 1 of Adcetris today and will continue on prednisone.  She is scheduled to be seen in follow-up on 12/22/2019.  Wheezing throughout the left lung field: The patient was given a prescription for Augmentin 875-125 p.o. twice daily x7 days.  Please see After Visit Summary for patient specific instructions.  Future Appointments  Date Time Provider [place]  12/22/2019  9:30 AM [facility] LAB 2 [facility] None  12/22/2019 10:00 AM Wyatt Portela, MD [facility] None  12/22/2019 10:45 AM [facility] INFUSION [facility] None  01/12/2020 11:15 AM [facility] LAB 3 [facility] None  01/12/2020 11:45 AM Alen Blew, Mathis Dad, MD [facility] None  01/12/2020 12:30 PM [facility] INFUSION [facility] None  08/31/2020 10:00 AM [facility] LAB 6 [facility] None  08/31/2020 10:30 AM Shadad, Mathis Dad, MD [facility] None    No orders of the defined types were placed in this encounter.      Subjective:   Patient ID:  ARIYON GERSTENBERGER is a 58 y.o. (DOB 1961-10-22) female.  Chief Complaint: No chief complaint on file.   HPI KALEEAH GINGERICH Is a 58 y.o. female with a diagnosis of a T-cell lymphoma.  She was seen in the infusion room today as she was receiving cycle 1 of Adcetris today.  She also continues on prednisone.  She was noted to have a temperature of 102.7 with a pulse of 149 respirations of 40.  Her oxygen saturation level was 97% on  room air.  She was seen with Dr. Alen Blew who expresses that her fevers are likely secondary to her recurrent lymphoma.  She had had similar symptoms at her first diagnosis.  She denies any other issues of concern today.  Medications: I have reviewed the patient's current medications.  Allergies:  Allergies  Allergen Reactions  . Iodine Anaphylaxis    Told to avoid due to shellfish allergy. Told to avoid due to shellfish allergy.  Marland Kitchen Shellfish-Derived Products Anaphylaxis and Swelling    Eyes swelling, scratchy throat, bumps on lips.    Past Medical History:  Diagnosis Date  . Acute bronchitis   . Anaplastic large cell lymphoma 2014   remission since 2015  . Asthma   . Cancer ([place])    lung and kidney  . Colon polyps 02/2013   Per colonoscopy  . Coma ([place])   . Diabetes mellitus without complication ([place])   . Diverticulosis 02/2013   Per colonoscopy  . Dysrhythmia   . Former light tobacco smoker   . GERD (gastroesophageal reflux disease)   . H/O stem cell transplant ([place]) 2015  . Hyperlipidemia   . Nerve pain    Left leg  . Non Hodgkin's lymphoma ([place])   . Seasonal allergies     Past Surgical History:  Procedure Laterality Date  . bones removed from Baby toes    . COLONOSCOPY  02/16/2012   Procedure: COLONOSCOPY;  Surgeon: Daneil Dolin, MD;  [facility];  Service: Endoscopy;  Laterality: N/A;  1:30  . COLONOSCOPY  N/A 03/26/2017   Procedure: COLONOSCOPY;  Surgeon: Daneil Dolin, MD;  [facility];  Service: Endoscopy;  Laterality: N/A;  930  . MEDIASTINOSCOPY N/A 05/13/2013   Procedure: MEDIASTINOSCOPY;  Surgeon: Melrose Nakayama, MD;  Location: [facility];  Service: Thoracic;  Laterality: N/A;  . PARTIAL HYSTERECTOMY    . VAGINAL HYSTERECTOMY     partial hyst   . VIDEO BRONCHOSCOPY WITH ENDOBRONCHIAL ULTRASOUND N/A 05/13/2013   Procedure: VIDEO BRONCHOSCOPY WITH ENDOBRONCHIAL ULTRASOUND;  Surgeon: Melrose Nakayama, MD;  Location[facility];  Service:  Thoracic;  Laterality: N/A;    Family History  Problem Relation Age of Onset  . Colon polyps Mother   . Hypertension Mother   . Heart disease Mother   . Cancer Father        prostate  . Hypertension Father   . Heart defect Other        family history   . Asthma Other        family history     Social History   Socioeconomic History  . Marital status: Married    Spouse name: Not on file  . Number of children: 3  . Years of education: Not on file  . Highest education level: Not on file  Occupational History  . Occupation: Lobbyist: Physicist, medical  Tobacco Use  . Smoking status: Former Smoker    Packs/day: 0.50    Types: Cigarettes    Quit date: 04/19/2013    Years since quitting: 6.6  . Smokeless tobacco: Never Used  Substance and Sexual Activity  . Alcohol use: No    Comment: one glass every three weeks  . Drug use: No  . Sexual activity: Yes    Birth control/protection: Surgical  Other Topics Concern  . Not on file  Social History Narrative  . Not on file   Social Determinants of Health   Financial Resource Strain:   . Difficulty of Paying Living Expenses: Not on file  Food Insecurity:   . Worried About Charity fundraiser in the Last Year: Not on file  . Ran Out of Food in the Last Year: Not on file  Transportation Needs:   . Lack of Transportation (Medical): Not on file  . Lack of Transportation (Non-Medical): Not on file  Physical Activity:   . Days of Exercise per Week: Not on file  . Minutes of Exercise per Session: Not on file  Stress:   . Feeling of Stress : Not on file  Social Connections:   . Frequency of Communication with Friends and Family: Not on file  . Frequency of Social Gatherings with Friends and Family: Not on file  . Attends Religious Services: Not on file  . Active Member of Clubs or Organizations: Not on file  . Attends Archivist Meetings: Not on file  . Marital Status: Not on file  Intimate Partner  Violence:   . Fear of Current or Ex-Partner: Not on file  . Emotionally Abused: Not on file  . Physically Abused: Not on file  . Sexually Abused: Not on file    Past Medical History, Surgical history, Social history, and Family history were reviewed and updated as appropriate.   Please see review of systems for further details on the patient's review from today.   Review of Systems:  Review of Systems  Constitutional: Positive for fever. Negative for chills and diaphoresis.  HENT: Negative for trouble swallowing and voice  change.   Respiratory: Negative for cough, chest tightness, shortness of breath and wheezing.   Cardiovascular: Negative for chest pain and palpitations.  Gastrointestinal: Negative for abdominal pain, constipation, diarrhea, nausea and vomiting.  Musculoskeletal: Negative for back pain and myalgias.  Neurological: Negative for dizziness, light-headedness and headaches.    Objective:   Physical Exam:  There were no vitals taken for this visit. ECOG: 0  Physical Exam Constitutional:      General: She is not in acute distress.    Appearance: She is not diaphoretic.  HENT:     Head: Normocephalic and atraumatic.  Eyes:     General: No scleral icterus.       Right eye: No discharge.        Left eye: No discharge.     Conjunctiva/sclera: Conjunctivae normal.  Cardiovascular:     Rate and Rhythm: Normal rate and regular rhythm.     Heart sounds: Normal heart sounds. No murmur. No friction rub. No gallop.   Pulmonary:     Effort: Pulmonary effort is normal. No respiratory distress.     Breath sounds: No stridor. Wheezing (Wheezes thoughout the left lung field. ) present. No rales.  Skin:    General: Skin is warm and dry.     Coloration: Skin is not pale.     Findings: No erythema.  Neurological:     Mental Status: She is alert.  Psychiatric:        Behavior: Behavior normal.        Thought Content: Thought content normal.        Judgment: Judgment  normal.    This patient was seen with Dr. Alen Blew with my treatment plan reviewed with him. He expressed agreement with my medical management of this patient.    Lab Review:     Component Value Date/Time   NA 136 11/28/2019 0953   NA 144 09/04/2017 0916   K 4.3 11/28/2019 0953   K 4.1 09/04/2017 0916   CL 99 11/28/2019 0953   CO2 26 11/28/2019 0953   CO2 26 09/04/2017 0916   GLUCOSE 108 (H) 11/28/2019 0953   GLUCOSE 197 (H) 09/04/2017 0916   BUN 10 11/28/2019 0953   BUN 15.6 09/04/2017 0916   CREATININE 0.81 11/28/2019 0953   CREATININE 1.0 09/04/2017 0916   CALCIUM 9.2 11/28/2019 0953   CALCIUM 9.5 09/04/2017 0916   PROT 7.6 11/28/2019 0953   PROT 6.8 09/04/2017 0916   ALBUMIN 2.6 (L) 11/28/2019 0953   ALBUMIN 3.8 09/04/2017 0916   AST 29 11/28/2019 0953   AST 19 09/04/2017 0916   ALT 27 11/28/2019 0953   ALT 18 09/04/2017 0916   ALKPHOS 46 11/28/2019 0953   ALKPHOS 49 09/04/2017 0916   BILITOT 0.7 11/28/2019 0953   BILITOT 0.49 09/04/2017 0916   GFRNONAA >60 11/28/2019 0953   GFRAA >60 11/28/2019 0953       Component Value Date/Time   WBC 8.2 11/28/2019 0953   WBC 3.0 (L) 10/31/2019 1224   RBC 3.70 (L) 11/28/2019 0953   HGB 9.7 (L) 11/28/2019 0953   HGB 12.0 09/04/2017 0916   HCT 30.7 (L) 11/28/2019 0953   HCT 37.0 09/04/2017 0916   PLT 247 11/28/2019 0953   PLT 157 09/04/2017 0916   MCV 83.0 11/28/2019 0953   MCV 84.0 09/04/2017 0916   MCH 26.2 11/28/2019 0953   MCHC 31.6 11/28/2019 0953   RDW 14.0 11/28/2019 0953   RDW 14.0 09/04/2017 0916   LYMPHSABS  1.0 11/28/2019 0953   LYMPHSABS 2.8 09/04/2017 0916   MONOABS 0.6 11/28/2019 0953   MONOABS 0.4 09/04/2017 0916   EOSABS 0.0 11/28/2019 0953   EOSABS 0.1 09/04/2017 0916   BASOSABS 0.0 11/28/2019 0953   BASOSABS 0.1 09/04/2017 0916   -------------------------------  Imaging from last 24 hours (if applicable):  Radiology interpretation: NM PET Image Initial (PI) Skull Base To Thigh  Result  Date: 11/21/2019 CLINICAL DATA:  Restaging treatment strategy for lymphoma. Large T-cell lymphoma diagnosed 2014. Relapsed 2000 20, November period EXAM: NUCLEAR MEDICINE PET SKULL BASE TO THIGH TECHNIQUE: 11.7 mCi F-18 FDG was injected intravenously. Full-ring PET imaging was performed from the skull base to thigh after the radiotracer. CT data was obtained and used for attenuation correction and anatomic localization. Fasting blood glucose: 104 mg/dl COMPARISON:  None. FINDINGS: Mediastinal blood pool activity: SUV max 2.5 Liver activity: SUV max 3.4 NECK: A chain bilateral enlarged hypermetabolic cervical lymph nodes present on LEFT and RIGHT. Example nodal mass on the LEFT with SUV max equal 28.3 at the level 2 nodal station. These nodal conglomerate measures 2 cm short axis. Bilateral hypermetabolic supraclavicular nodes. Example LEFT supraclavicular node measures 16 mm short axis with SUV max equal 27.7 Incidental CT findings: none CHEST: Bilateral hypermetabolic axial lymph node more prominent on the LEFT. Example LEFT axial lymph nodes with SUV max equal 21.1. Intensely hypermetabolic mediastinal enlarged lymph nodes. For example subcarinal lymph node measuring 20 mm short axis with SUV max equal 18.9. Incidental CT findings: No suspicious pulmonary nodularity. ABDOMEN/PELVIS: Hypermetabolic periportal lymph nodes with SUV max equal 15.5. No abnormal activity liver. The spleen is normal size and normal metabolic activity. Hypermetabolic periaortic retroperitoneal lymph nodes are present. Example node position between the aorta and IVC with SUV max equal 17.9. Several enlarged hypermetabolic iliac lymph nodes. Hypermetabolic LEFT inguinal lymph node with SUV max equal 10.6 (image 25/4 measuring 12 mm). Incidental CT findings: There is intense metabolic activity throughout the colon which is favored physiologic. SKELETON: No focal hypermetabolic activity to suggest skeletal metastasis. Incidental CT findings:  none IMPRESSION: 1. Intensely hypermetabolic cervical adenopathy, axillary adenopathy, mediastinal adenopathy, retroperitoneal periaortic adenopathy and inguinal adenopathy. Findings consistent with high-grade lymphoma recurrence ( [place] 5). 2. No evidence of lymphoma involvement in the spleen or bone marrow. Electronically Signed   By: Suzy Bouchard M.D.   On: 11/21/2019 12:20       Oncology addendum:   Patient seen and examined personally.  She appeared nontoxic without any distress.  Her fever likely related to her lymphoma.  Physical examination she did have cervical neck adenopathy which is unchanged.  I recommended continuing with chemotherapy given her likelihood of symptoms are related to lymphoma and prophylactic antibiotics for a presumed bronchitis or pneumonia.  We gave her clear instructions to report any symptoms of shortness of breath or difficulty breathing.  Zola Button  MD12/23/2020

## 2019-12-15 ENCOUNTER — Other Ambulatory Visit: Payer: Self-pay | Admitting: Student

## 2019-12-18 ENCOUNTER — Other Ambulatory Visit: Payer: Self-pay | Admitting: Radiology

## 2019-12-19 ENCOUNTER — Encounter (HOSPITAL_COMMUNITY): Payer: Self-pay

## 2019-12-19 ENCOUNTER — Other Ambulatory Visit: Payer: Self-pay

## 2019-12-19 ENCOUNTER — Other Ambulatory Visit: Payer: Self-pay | Admitting: Oncology

## 2019-12-19 ENCOUNTER — Ambulatory Visit (HOSPITAL_COMMUNITY)
Admission: RE | Admit: 2019-12-19 | Discharge: 2019-12-19 | Disposition: A | Discharge status: Home / Self Care | Payer: BC Managed Care – PPO | Source: Ambulatory Visit | Attending: Oncology | Admitting: Oncology

## 2019-12-19 DIAGNOSIS — Z87891 Personal history of nicotine dependence: Secondary | ICD-10-CM | POA: Insufficient documentation

## 2019-12-19 DIAGNOSIS — K219 Gastro-esophageal reflux disease without esophagitis: Secondary | ICD-10-CM | POA: Insufficient documentation

## 2019-12-19 DIAGNOSIS — E785 Hyperlipidemia, unspecified: Secondary | ICD-10-CM | POA: Diagnosis not present

## 2019-12-19 DIAGNOSIS — Z5111 Encounter for antineoplastic chemotherapy: Secondary | ICD-10-CM | POA: Diagnosis not present

## 2019-12-19 DIAGNOSIS — J45909 Unspecified asthma, uncomplicated: Secondary | ICD-10-CM | POA: Diagnosis not present

## 2019-12-19 DIAGNOSIS — E119 Type 2 diabetes mellitus without complications: Secondary | ICD-10-CM | POA: Diagnosis not present

## 2019-12-19 DIAGNOSIS — Z7984 Long term (current) use of oral hypoglycemic drugs: Secondary | ICD-10-CM | POA: Diagnosis not present

## 2019-12-19 DIAGNOSIS — Z79899 Other long term (current) drug therapy: Secondary | ICD-10-CM | POA: Insufficient documentation

## 2019-12-19 DIAGNOSIS — C8460 Anaplastic large cell lymphoma, ALK-positive, unspecified site: Secondary | ICD-10-CM

## 2019-12-19 DIAGNOSIS — C8330 Diffuse large B-cell lymphoma, unspecified site: Secondary | ICD-10-CM | POA: Diagnosis not present

## 2019-12-19 HISTORY — PX: IR IMAGING GUIDED PORT INSERTION: IMG5740

## 2019-12-19 LAB — CBC
HCT: 30.6 % — ABNORMAL LOW (ref 36.0–46.0)
Hemoglobin: 9 g/dL — ABNORMAL LOW (ref 12.0–15.0)
MCH: 27.1 pg (ref 26.0–34.0)
MCHC: 29.4 g/dL — ABNORMAL LOW (ref 30.0–36.0)
MCV: 92.2 fL (ref 80.0–100.0)
Platelets: 184 10*3/uL (ref 150–400)
RBC: 3.32 MIL/uL — ABNORMAL LOW (ref 3.87–5.11)
RDW: 19.6 % — ABNORMAL HIGH (ref 11.5–15.5)
WBC: 4.6 10*3/uL (ref 4.0–10.5)
nRBC: 0 % (ref 0.0–0.2)

## 2019-12-19 LAB — PROTIME-INR
INR: 1 (ref 0.8–1.2)
Prothrombin Time: 13.4 seconds (ref 11.4–15.2)

## 2019-12-19 MED ORDER — FENTANYL CITRATE (PF) 100 MCG/2ML IJ SOLN
INTRAMUSCULAR | Status: AC | PRN
  Administered 2019-12-19: 50 ug via INTRAVENOUS
  Administered 2019-12-19: 25 ug via INTRAVENOUS
  Administered 2019-12-19: 50 ug via INTRAVENOUS

## 2019-12-19 MED ORDER — NALOXONE HCL 0.4 MG/ML IJ SOLN
INTRAMUSCULAR | Status: AC
  Filled 2019-12-19: qty 1

## 2019-12-19 MED ORDER — LIDOCAINE HCL 1 % IJ SOLN
INTRAMUSCULAR | Status: AC
  Filled 2019-12-19: qty 20

## 2019-12-19 MED ORDER — LIDOCAINE-EPINEPHRINE 1 %-1:100000 IJ SOLN
INTRAMUSCULAR | Status: AC
  Filled 2019-12-19: qty 1

## 2019-12-19 MED ORDER — HEPARIN SOD (PORK) LOCK FLUSH 100 UNIT/ML IV SOLN
INTRAVENOUS | Status: AC
  Filled 2019-12-19: qty 5

## 2019-12-19 MED ORDER — MIDAZOLAM HCL 2 MG/2ML IJ SOLN
INTRAMUSCULAR | Status: AC
  Filled 2019-12-19: qty 4

## 2019-12-19 MED ORDER — CEFAZOLIN SODIUM-DEXTROSE 2-4 GM/100ML-% IV SOLN
INTRAVENOUS | Status: AC
  Filled 2019-12-19: qty 100

## 2019-12-19 MED ORDER — CEFAZOLIN SODIUM-DEXTROSE 2-4 GM/100ML-% IV SOLN
2.0000 g | Freq: Once | INTRAVENOUS | Status: AC
  Administered 2019-12-19: 2 g via INTRAVENOUS

## 2019-12-19 MED ORDER — FENTANYL CITRATE (PF) 100 MCG/2ML IJ SOLN
INTRAMUSCULAR | Status: AC
  Filled 2019-12-19: qty 4

## 2019-12-19 MED ORDER — SODIUM CHLORIDE 0.9 % IV SOLN: INTRAVENOUS | Status: DC

## 2019-12-19 MED ORDER — MIDAZOLAM HCL 2 MG/2ML IJ SOLN
INTRAMUSCULAR | Status: AC | PRN
  Administered 2019-12-19 (×3): 1 mg via INTRAVENOUS

## 2019-12-19 MED ORDER — FLUMAZENIL 0.5 MG/5ML IV SOLN
INTRAVENOUS | Status: AC
  Filled 2019-12-19: qty 5

## 2019-12-19 NOTE — Discharge Instructions (Signed)
NO EMLA Cream for 2 weeks.  Implanted Port Insertion, Care After This sheet gives you information about how to care for yourself after your procedure. Your health care provider may also give you more specific instructions. If you have problems or questions, contact your health care provider. What can I expect after the procedure? After the procedure, it is common to have:  Discomfort at the port insertion site.  Bruising on the skin over the port. This should improve over 3-4 days. Follow these instructions at home: [facility] care  After your port is placed, you will get a manufacturer's information card. The card has information about your port. Keep this card with you at all times.  Take care of the port as told by your health care provider. Ask your health care provider if you or a family member can get training for taking care of the port at home. A home health care nurse may also take care of the port.  Make sure to remember what type of port you have. Incision care  Follow instructions from your health care provider about how to take care of your port insertion site. Make sure you: ? Wash your hands with soap and water before and after you change your bandage (dressing). If soap and water are not available, use hand sanitizer. ? Change your dressing as told by your health care provider. You may remove your dressing 12/19/2018 afternoon at            and shower.  Leave  skin glue in place. These skin closures may need to stay in place for 2 weeks or longer. Check your port insertion site every day for signs of infection. Check for: ? Redness, swelling, or pain. ? Fluid or blood. ? Warmth. ? Pus or a bad smell. Activity  Return to your normal activities as told by your health care provider. Ask your health care provider what activities are safe for you.  Do not lift anything that is heavier than 10 lb (4.5 kg), or the limit that you are told, until your health care provider says that it  is safe. General instructions  Take over-the-counter and prescription medicines only as told by your health care provider.  Do not take baths, swim, or use a hot tub until your health care provider approves.  Do not drive for 24 hours if you were given a sedative during your procedure.  Wear a medical alert bracelet in case of an emergency. This will tell any health care providers that you have a port.  Keep all follow-up visits as told by your health care provider. This is important. Contact a health care provider if:  You cannot flush your port with saline as directed, or you cannot draw blood from the port.  You have a fever or chills.  You have redness, swelling, or pain around your port insertion site.  You have fluid or blood coming from your port insertion site.  Your port insertion site feels warm to the touch.  You have pus or a bad smell coming from the port insertion site. Get help right away if:  You have chest pain or shortness of breath.  You have bleeding from your port that you cannot control. Summary  Take care of the port as told by your health care provider. Keep the manufacturer's information card with you at all times.  Change your dressing as told by your health care provider.  Contact a health care provider if you have  a fever or chills or if you have redness, swelling, or pain around your port insertion site.  Keep all follow-up visits as told by your health care provider. This information is not intended to replace advice given to you by your health care provider. Make sure you discuss any questions you have with your health care provider. Document Revised: 06/29/2018 Document Reviewed: 06/29/2018 Elsevier Patient Education  [place].      Moderate Conscious Sedation, Adult, Care After These instructions provide you with information about caring for yourself after your procedure. Your health care provider may also give you more  specific instructions. Your treatment has been planned according to current medical practices, but problems sometimes occur. Call your health care provider if you have any problems or questions after your procedure. What can I expect after the procedure? After your procedure, it is common:  To feel sleepy for several hours.  To feel clumsy and have poor balance for several hours.  To have poor judgment for several hours.  To vomit if you eat too soon. Follow these instructions at home: For at least 24 hours after the procedure:   Do not: ? Participate in activities where you could fall or become injured. ? Drive. ? Use heavy machinery. ? Drink alcohol. ? Take sleeping pills or medicines that cause drowsiness. ? Make important decisions or sign legal documents. ? Take care of children on your own.  Rest. Eating and drinking  Follow the diet recommended by your health care provider.  If you vomit: ? Drink water, juice, or soup when you can drink without vomiting. ? Make sure you have little or no nausea before eating solid foods. General instructions  Have a responsible adult stay with you until you are awake and alert.  Take over-the-counter and prescription medicines only as told by your health care provider.  If you smoke, do not smoke without supervision.  Keep all follow-up visits as told by your health care provider. This is important. Contact a health care provider if:  You keep feeling nauseous or you keep vomiting.  You feel light-headed.  You develop a rash.  You have a fever. Get help right away if:  You have trouble breathing. This information is not intended to replace advice given to you by your health care provider. Make sure you discuss any questions you have with your health care provider. Document Revised: 11/13/2017 Document Reviewed: 03/22/2016 Elsevier Patient Education  2020 [employer].

## 2019-12-19 NOTE — Procedures (Signed)
Interventional Radiology Procedure Note  Procedure: Port placement, LEFT.  SL PowerPort.  Tip in the upper RA and ready for use.   Complications: None  Estimated Blood Loss: None  Recommendations: - Routine line care.   Signed,  Criselda Peaches, MD

## 2019-12-19 NOTE — H&P (Signed)
Chief Complaint: Patient was seen in consultation today for port placement.  Referring Physician(s): Megan Mcknight  Supervising Physician: Jacqulynn Cadet  Patient Status: [facility] - Out-pt  History of Present Illness: Megan Mcknight is a 59 y.o. female with a past medical history significant for asthma, GERD, DM, HLD and anaplastic large T-cell lymphoma followed by Dr. Alen Blew who presents today for port placement. Megan Mcknight is well known to IR service due to previous bone marrow biopsy/port placement 2014, port removal 2017 and left supraclavicular lymph node biopsy 2020. She has been followed closely by oncology since completing treatment for lymphoma in 2017 and reported left neck swelling 09/2019. She underwent a CT neck/chest with contrast on 10/07/19 which was notable for lymphadenopathy in the left neck consistent with lymphoma recurrence. She underwent an image guided biopsy of a left supraclavicular lymph node on 10/31/19 in IR, pathology of which showed anaplastic large cell lymphoma. After thorough discussion with Dr. Alen Blew, patient has decided to proceed with systemic treatment and IR has been asked to place a port for venous access.   [name] denies any complaints today - she states she had one chemo treatment already and is planned for another treatment on Thursday. She is looking forward to having her port replaced so she doesn't have to get stuck so many times. She states understanding of the requested procedure and wishes to proceed as planned.   Past Medical History:  Diagnosis Date  . Acute bronchitis   . Anaplastic large cell lymphoma 2014   remission since 2015  . Asthma   . Cancer ([place])    lung and kidney  . Colon polyps 02/2013   Per colonoscopy  . Coma ([place])   . Diabetes mellitus without complication ([place])   . Diverticulosis 02/2013   Per colonoscopy  . Dysrhythmia   . Former light tobacco smoker   . GERD (gastroesophageal reflux disease)   . H/O stem cell  transplant ([place]) 2015  . Hyperlipidemia   . Nerve pain    Left leg  . Non Hodgkin's lymphoma ([place])   . Seasonal allergies     Past Surgical History:  Procedure Laterality Date  . bones removed from Baby toes    . COLONOSCOPY  02/16/2012   Procedure: COLONOSCOPY;  Surgeon: Daneil Dolin, MD;  Location: [facility] ENDO SUITE;  Service: Endoscopy;  Laterality: N/A;  1:30  . COLONOSCOPY N/A 03/26/2017   Procedure: COLONOSCOPY;  Surgeon: Daneil Dolin, MD;  Location: [facility];  Service: Endoscopy;  Laterality: N/A;  930  . MEDIASTINOSCOPY N/A 05/13/2013   Procedure: MEDIASTINOSCOPY;  Surgeon: Melrose Nakayama, MD;  Location: [facility];  Service: Thoracic;  Laterality: N/A;  . PARTIAL HYSTERECTOMY    . VAGINAL HYSTERECTOMY     partial hyst   . VIDEO BRONCHOSCOPY WITH ENDOBRONCHIAL ULTRASOUND N/A 05/13/2013   Procedure: VIDEO BRONCHOSCOPY WITH ENDOBRONCHIAL ULTRASOUND;  Surgeon: Melrose Nakayama, MD;  Location: [facility] OR;  Service: Thoracic;  Laterality: N/A;    Allergies: Iodine and Shellfish-derived products  Medications: Prior to Admission medications   Medication Sig Start Date End Date Taking? Authorizing Provider  albuterol (PROVENTIL HFA;VENTOLIN HFA) 108 (90 Base) MCG/ACT inhaler Inhale 2 puffs into the lungs every 4 (four) hours as needed for wheezing or shortness of breath. 10/27/16   Orpah Greek, MD  amLODipine (NORVASC) 5 MG tablet Take 5 mg by mouth daily.    [provider]  amoxicillin-clavulanate (AUGMENTIN) 875-125 MG tablet Take 1 tablet  by mouth 2 (two) times daily. 12/01/19   Tanner, Lyndon Code., PA-C  metFORMIN (GLUCOPHAGE) 500 MG tablet Take by mouth 2 (two) times daily with a meal.    [provider]  metoprolol tartrate (LOPRESSOR) 25 MG tablet Take 25 mg by mouth 2 (two) times daily.     [provider]  metroNIDAZOLE (METROGEL) 0.75 % vaginal gel Place 1 Applicatorful vaginally at bedtime. Apply one applicatorful to vagina at bedtime  for 5 days 08/08/19   Jonnie Kind, MD  montelukast (SINGULAIR) 10 MG tablet Take 10 mg by mouth at bedtime.    [provider]  predniSONE (DELTASONE) 20 MG tablet Take 3 tablets (60 mg total) by mouth daily with breakfast. 10/07/19   Megan Portela, MD  prochlorperazine (COMPAZINE) 10 MG tablet Take 1 tablet (10 mg total) by mouth every 6 (six) hours as needed for nausea or vomiting. 12/01/19   Megan Portela, MD  rosuvastatin (CRESTOR) 10 MG tablet Take 10 mg by mouth daily.    [provider]     Family History  Problem Relation Age of Onset  . Colon polyps Mother   . Hypertension Mother   . Heart disease Mother   . Cancer Father        prostate  . Hypertension Father   . Heart defect Other        family history   . Asthma Other        family history     Social History   Socioeconomic History  . Marital status: Married    Spouse name: Not on file  . Number of children: 3  . Years of education: Not on file  . Highest education level: Not on file  Occupational History  . Occupation: Lobbyist: Physicist, medical  Tobacco Use  . Smoking status: Former Smoker    Packs/day: 0.50    Types: Cigarettes    Quit date: 04/19/2013    Years since quitting: 6.6  . Smokeless tobacco: Never Used  Substance and Sexual Activity  . Alcohol use: No    Comment: one glass every three weeks  . Drug use: No  . Sexual activity: Yes    Birth control/protection: Surgical  Other Topics Concern  . Not on file  Social History Narrative  . Not on file   Social Determinants of Health   Financial Resource Strain:   . Difficulty of Paying Living Expenses: Not on file  Food Insecurity:   . Worried About Charity fundraiser in the Last Year: Not on file  . Ran Out of Food in the Last Year: Not on file  Transportation Needs:   . Lack of Transportation (Medical): Not on file  . Lack of Transportation (Non-Medical): Not on file  Physical Activity:   . Days of  Exercise per Week: Not on file  . Minutes of Exercise per Session: Not on file  Stress:   . Feeling of Stress : Not on file  Social Connections:   . Frequency of Communication with Friends and Family: Not on file  . Frequency of Social Gatherings with Friends and Family: Not on file  . Attends Religious Services: Not on file  . Active Member of Clubs or Organizations: Not on file  . Attends Archivist Meetings: Not on file  . Marital Status: Not on file     Review of Systems: A 12 point ROS discussed and pertinent positives are indicated  in the HPI above.  All other systems are negative.  Review of Systems  Constitutional: Negative for chills and fever.  Respiratory: Negative for cough and shortness of breath.   Cardiovascular: Negative for chest pain.  Gastrointestinal: Negative for abdominal pain, diarrhea, nausea and vomiting.  Musculoskeletal: Negative for back pain.  Skin: Negative for rash and wound.  Neurological: Negative for dizziness and headaches.    Vital Signs: BP (!) 144/95   Pulse 98   Temp 98.4 F (36.9 C) (Oral)   Resp 18   SpO2 96%   Physical Exam Vitals reviewed.  Constitutional:      General: She is not in acute distress. HENT:     Head: Normocephalic.     Mouth/Throat:     Mouth: Mucous membranes are moist.     Pharynx: Oropharynx is clear. No oropharyngeal exudate or posterior oropharyngeal erythema.  Cardiovascular:     Rate and Rhythm: Normal rate and regular rhythm.  Pulmonary:     Effort: Pulmonary effort is normal.     Breath sounds: Normal breath sounds.  Abdominal:     General: There is no distension.     Palpations: Abdomen is soft.     Tenderness: There is no abdominal tenderness.  Skin:    General: Skin is warm and dry.  Neurological:     Mental Status: She is alert and oriented to person, place, and time.  Psychiatric:        Mood and Affect: Mood normal.        Behavior: Behavior normal.        Thought Content:  Thought content normal.        Judgment: Judgment normal.      MD Evaluation Airway: WNL Heart: WNL Abdomen: WNL Chest/ Lungs: WNL ASA  Classification: 2 Mallampati/Airway Score: One   Imaging: NM PET Image Initial (PI) Skull Base To Thigh  Result Date: 11/21/2019 CLINICAL DATA:  Restaging treatment strategy for lymphoma. Large T-cell lymphoma diagnosed 2014. Relapsed 2000 20, November period EXAM: NUCLEAR MEDICINE PET SKULL BASE TO THIGH TECHNIQUE: 11.7 mCi F-18 FDG was injected intravenously. Full-ring PET imaging was performed from the skull base to thigh after the radiotracer. CT data was obtained and used for attenuation correction and anatomic localization. Fasting blood glucose: 104 mg/dl COMPARISON:  None. FINDINGS: Mediastinal blood pool activity: SUV max 2.5 Liver activity: SUV max 3.4 NECK: A chain bilateral enlarged hypermetabolic cervical lymph nodes present on LEFT and RIGHT. Example nodal mass on the LEFT with SUV max equal 28.3 at the level 2 nodal station. These nodal conglomerate measures 2 cm short axis. Bilateral hypermetabolic supraclavicular nodes. Example LEFT supraclavicular node measures 16 mm short axis with SUV max equal 27.7 Incidental CT findings: none CHEST: Bilateral hypermetabolic axial lymph node more prominent on the LEFT. Example LEFT axial lymph nodes with SUV max equal 21.1. Intensely hypermetabolic mediastinal enlarged lymph nodes. For example subcarinal lymph node measuring 20 mm short axis with SUV max equal 18.9. Incidental CT findings: No suspicious pulmonary nodularity. ABDOMEN/PELVIS: Hypermetabolic periportal lymph nodes with SUV max equal 15.5. No abnormal activity liver. The spleen is normal size and normal metabolic activity. Hypermetabolic periaortic retroperitoneal lymph nodes are present. Example node position between the aorta and IVC with SUV max equal 17.9. Several enlarged hypermetabolic iliac lymph nodes. Hypermetabolic LEFT inguinal lymph  node with SUV max equal 10.6 (image 25/4 measuring 12 mm). Incidental CT findings: There is intense metabolic activity throughout the colon which is favored physiologic.  SKELETON: No focal hypermetabolic activity to suggest skeletal metastasis. Incidental CT findings: none IMPRESSION: 1. Intensely hypermetabolic cervical adenopathy, axillary adenopathy, mediastinal adenopathy, retroperitoneal periaortic adenopathy and inguinal adenopathy. Findings consistent with high-grade lymphoma recurrence ( [place] 5). 2. No evidence of lymphoma involvement in the spleen or bone marrow. Electronically Signed   By: Suzy Bouchard M.D.   On: 11/21/2019 12:20    Labs:  CBC: Recent Labs    09/02/19 1007 10/31/19 1224 11/28/19 0953  WBC 5.5 3.0* 8.2  HGB 12.5 10.5* 9.7*  HCT 40.0 34.2* 30.7*  PLT 169 167 247    COAGS: Recent Labs    10/31/19 1224  INR 1.2    BMP: Recent Labs    09/02/19 1007 10/31/19 1224 11/28/19 0953  NA 142 137 136  K 4.2 3.9 4.3  CL 107 105 99  CO2 28 23 26   GLUCOSE 141* 95 108*  BUN 12 10 10   CALCIUM 9.3 8.8* 9.2  CREATININE 0.94 0.92 0.81  GFRNONAA >60 >60 >60  GFRAA >60 >60 >60    LIVER FUNCTION TESTS: Recent Labs    09/02/19 1007 11/28/19 0953  BILITOT 0.5 0.7  AST 17 29  ALT 17 27  ALKPHOS 49 46  PROT 7.0 7.6  ALBUMIN 4.0 2.6*    TUMOR MARKERS: No results for input(s): AFPTM, CEA, CA199, CHROMGRNA in the last 8760 hours.  Assessment and Plan:  59 y/o F with history of anaplastic T-cell lymphoma in remission 2017 with disease recurrence 2020 followed by Dr. Alen Blew who presents today for port placement to begin systemic therapy.  Patient has been Npo since midnight, she does not take blood thinning medications. Afebrile, WBC 4.6, hgb 9.0, plt 184, INR 1.0.  Risks and benefits of image guided port-a-catheter placement were discussed with the patient including, but not limited to bleeding, infection, pneumothorax, or fibrin sheath development  and need for additional procedures.  All of the patient's questions were answered, patient is agreeable to proceed.  Consent signed and in chart.  Thank you for this interesting consult.  I greatly enjoyed meeting PALLAVI CLIFTON and look forward to participating in their care.  A copy of this report was sent to the requesting provider on this date.  Electronically Signed: Joaquim Nam, PA-C 12/19/2019, 8:20 AM   I spent a total of  15 Minutes in face to face in clinical consultation, greater than 50% of which was counseling/coordinating care for port placement.

## 2019-12-21 ENCOUNTER — Other Ambulatory Visit: Payer: Self-pay

## 2019-12-21 DIAGNOSIS — C8460 Anaplastic large cell lymphoma, ALK-positive, unspecified site: Secondary | ICD-10-CM

## 2019-12-22 ENCOUNTER — Other Ambulatory Visit: Payer: Self-pay

## 2019-12-22 ENCOUNTER — Inpatient Hospital Stay: Payer: BC Managed Care – PPO

## 2019-12-22 ENCOUNTER — Inpatient Hospital Stay (HOSPITAL_BASED_OUTPATIENT_CLINIC_OR_DEPARTMENT_OTHER): Payer: BC Managed Care – PPO | Admitting: Oncology

## 2019-12-22 ENCOUNTER — Inpatient Hospital Stay: Payer: BC Managed Care – PPO | Attending: Oncology

## 2019-12-22 VITALS — BP 127/82 | HR 84 | Temp 97.3°F | Resp 18 | Ht 68.0 in | Wt 231.7 lb

## 2019-12-22 DIAGNOSIS — C8460 Anaplastic large cell lymphoma, ALK-positive, unspecified site: Secondary | ICD-10-CM

## 2019-12-22 DIAGNOSIS — I10 Essential (primary) hypertension: Secondary | ICD-10-CM | POA: Diagnosis not present

## 2019-12-22 DIAGNOSIS — Z5112 Encounter for antineoplastic immunotherapy: Secondary | ICD-10-CM | POA: Insufficient documentation

## 2019-12-22 DIAGNOSIS — R000 Tachycardia, unspecified: Secondary | ICD-10-CM | POA: Insufficient documentation

## 2019-12-22 DIAGNOSIS — R591 Generalized enlarged lymph nodes: Secondary | ICD-10-CM | POA: Diagnosis not present

## 2019-12-22 DIAGNOSIS — R509 Fever, unspecified: Secondary | ICD-10-CM | POA: Diagnosis not present

## 2019-12-22 DIAGNOSIS — E785 Hyperlipidemia, unspecified: Secondary | ICD-10-CM | POA: Insufficient documentation

## 2019-12-22 DIAGNOSIS — R05 Cough: Secondary | ICD-10-CM | POA: Diagnosis not present

## 2019-12-22 DIAGNOSIS — R5383 Other fatigue: Secondary | ICD-10-CM | POA: Diagnosis not present

## 2019-12-22 DIAGNOSIS — E119 Type 2 diabetes mellitus without complications: Secondary | ICD-10-CM | POA: Diagnosis not present

## 2019-12-22 DIAGNOSIS — Z79899 Other long term (current) drug therapy: Secondary | ICD-10-CM | POA: Insufficient documentation

## 2019-12-22 DIAGNOSIS — R5381 Other malaise: Secondary | ICD-10-CM | POA: Insufficient documentation

## 2019-12-22 DIAGNOSIS — D649 Anemia, unspecified: Secondary | ICD-10-CM | POA: Insufficient documentation

## 2019-12-22 DIAGNOSIS — Z7984 Long term (current) use of oral hypoglycemic drugs: Secondary | ICD-10-CM | POA: Diagnosis not present

## 2019-12-22 LAB — CBC WITH DIFFERENTIAL (CANCER CENTER ONLY)
Abs Immature Granulocytes: 0.02 10*3/uL (ref 0.00–0.07)
Basophils Absolute: 0 10*3/uL (ref 0.0–0.1)
Basophils Relative: 0 %
Eosinophils Absolute: 0.1 10*3/uL (ref 0.0–0.5)
Eosinophils Relative: 3 %
HCT: 28.7 % — ABNORMAL LOW (ref 36.0–46.0)
Hemoglobin: 8.8 g/dL — ABNORMAL LOW (ref 12.0–15.0)
Immature Granulocytes: 0 %
Lymphocytes Relative: 38 %
Lymphs Abs: 1.8 10*3/uL (ref 0.7–4.0)
MCH: 26.7 pg (ref 26.0–34.0)
MCHC: 30.7 g/dL (ref 30.0–36.0)
MCV: 87.2 fL (ref 80.0–100.0)
Monocytes Absolute: 0.4 10*3/uL (ref 0.1–1.0)
Monocytes Relative: 8 %
Neutro Abs: 2.4 10*3/uL (ref 1.7–7.7)
Neutrophils Relative %: 51 %
Platelet Count: 191 10*3/uL (ref 150–400)
RBC: 3.29 MIL/uL — ABNORMAL LOW (ref 3.87–5.11)
RDW: 19.3 % — ABNORMAL HIGH (ref 11.5–15.5)
WBC Count: 4.8 10*3/uL (ref 4.0–10.5)
nRBC: 0 % (ref 0.0–0.2)

## 2019-12-22 LAB — CMP (CANCER CENTER ONLY)
ALT: 34 U/L (ref 0–44)
AST: 23 U/L (ref 15–41)
Albumin: 3.5 g/dL (ref 3.5–5.0)
Alkaline Phosphatase: 46 U/L (ref 38–126)
Anion gap: 10 (ref 5–15)
BUN: 10 mg/dL (ref 6–20)
CO2: 25 mmol/L (ref 22–32)
Calcium: 8.9 mg/dL (ref 8.9–10.3)
Chloride: 109 mmol/L (ref 98–111)
Creatinine: 0.69 mg/dL (ref 0.44–1.00)
GFR, Est AFR Am: >60 mL/min (ref >60)
GFR, Estimated: >60 mL/min (ref >60)
Glucose, Bld: 127 mg/dL — ABNORMAL HIGH (ref 70–99)
Potassium: 3.7 mmol/L (ref 3.5–5.1)
Sodium: 144 mmol/L (ref 135–145)
Total Bilirubin: 0.6 mg/dL (ref 0.3–1.2)
Total Protein: 6.5 g/dL (ref 6.5–8.1)

## 2019-12-22 LAB — SAMPLE TO BLOOD BANK

## 2019-12-22 MED ORDER — LIDOCAINE-PRILOCAINE 2.5-2.5 % EX CREA: 1.0000 "application " | TOPICAL_CREAM | CUTANEOUS | 0 refills | Status: DC | PRN

## 2019-12-22 MED ORDER — ACETAMINOPHEN 325 MG PO TABS
650.0000 mg | ORAL_TABLET | Freq: Once | ORAL | Status: AC
  Administered 2019-12-22: 650 mg via ORAL

## 2019-12-22 MED ORDER — HEPARIN SOD (PORK) LOCK FLUSH 100 UNIT/ML IV SOLN
500.0000 [IU] | Freq: Once | INTRAVENOUS | Status: AC | PRN
  Administered 2019-12-22: 500 [IU]
  Filled 2019-12-22: qty 5

## 2019-12-22 MED ORDER — SODIUM CHLORIDE 0.9 % IV SOLN
Freq: Once | INTRAVENOUS | Status: AC
  Filled 2019-12-22: qty 250

## 2019-12-22 MED ORDER — SODIUM CHLORIDE 0.9% FLUSH
10.0000 mL | INTRAVENOUS | Status: DC | PRN
  Administered 2019-12-22: 10 mL
  Filled 2019-12-22: qty 10

## 2019-12-22 MED ORDER — SODIUM CHLORIDE 0.9 % IV SOLN
180.0000 mg | Freq: Once | INTRAVENOUS | Status: AC
  Administered 2019-12-22: 180 mg via INTRAVENOUS
  Filled 2019-12-22: qty 36

## 2019-12-22 MED ORDER — DIPHENHYDRAMINE HCL 50 MG/ML IJ SOLN
50.0000 mg | Freq: Once | INTRAMUSCULAR | Status: AC
  Administered 2019-12-22: 50 mg via INTRAVENOUS

## 2019-12-22 MED ORDER — ACETAMINOPHEN 325 MG PO TABS
ORAL_TABLET | ORAL | Status: AC
  Filled 2019-12-22: qty 2

## 2019-12-22 MED ORDER — DIPHENHYDRAMINE HCL 50 MG/ML IJ SOLN
INTRAMUSCULAR | Status: AC
  Filled 2019-12-22: qty 1

## 2019-12-22 MED ORDER — DEXAMETHASONE SODIUM PHOSPHATE 10 MG/ML IJ SOLN
10.0000 mg | Freq: Once | INTRAMUSCULAR | Status: AC
  Administered 2019-12-22: 10 mg via INTRAVENOUS

## 2019-12-22 MED ORDER — DEXAMETHASONE SODIUM PHOSPHATE 10 MG/ML IJ SOLN
INTRAMUSCULAR | Status: AC
  Filled 2019-12-22: qty 1

## 2019-12-22 NOTE — Progress Notes (Signed)
12/22/19  Patient's weight today is 105.1kg and >10% weight gain.  Received orders from MD to adjust dose based on new weight.  1.8 mg/kg = 189 mg - MD would like to give 180 mg today.    T.O. Dr Creta Levin, PharmD

## 2019-12-22 NOTE — Patient Instructions (Signed)
[facility] Discharge Instructions for Patients Receiving Chemotherapy  Today you received the following chemotherapy agent: Brentuximab  To help prevent nausea and vomiting after your treatment, we encourage you to take your nausea medication as directed by your MD.   If you develop nausea and vomiting that is not controlled by your nausea medication, call the clinic.   BELOW ARE SYMPTOMS THAT SHOULD BE REPORTED IMMEDIATELY:  *FEVER GREATER THAN 100.5 F  *CHILLS WITH OR WITHOUT FEVER  NAUSEA AND VOMITING THAT IS NOT CONTROLLED WITH YOUR NAUSEA MEDICATION  *UNUSUAL SHORTNESS OF BREATH  *UNUSUAL BRUISING OR BLEEDING  TENDERNESS IN MOUTH AND THROAT WITH OR WITHOUT PRESENCE OF ULCERS  *URINARY PROBLEMS  *BOWEL PROBLEMS  UNUSUAL RASH Items with * indicate a potential emergency and should be followed up as soon as possible.  Feel free to call the clinic should you have any questions or concerns. The clinic phone number is [phone].  Please show the [facility] at check-in to the Emergency Department and triage nurse.  Coronavirus (COVID-19) Are you at risk?  Are you at risk for the Coronavirus (COVID-19)?  To be considered HIGH RISK for Coronavirus (COVID-19), you have to meet the following criteria:  . Traveled to [place], [place], [place], [place] or [place]; or in the [place] to [place], [place], [place], or [place]; and have fever, cough, and shortness of breath within the last 2 weeks of travel OR . Been in close contact with a person diagnosed with COVID-19 within the last 2 weeks and have fever, cough, and shortness of breath . IF YOU DO NOT MEET THESE CRITERIA, YOU ARE CONSIDERED LOW RISK FOR COVID-19.  What to do if you are HIGH RISK for COVID-19?  Marland Kitchen If you are having a medical emergency, call 911. . Seek medical care right away. Before you go to a doctor's office, urgent care or emergency department, call ahead and tell them  about your recent travel, contact with someone diagnosed with COVID-19, and your symptoms. You should receive instructions from your physician's office regarding next steps of care.  . When you arrive at healthcare provider, tell the healthcare staff immediately you have returned from visiting [place], [place], [place], [place] or [place]; or traveled in the [place] to [place], [place], [place], or [place]; in the last two weeks or you have been in close contact with a person diagnosed with COVID-19 in the last 2 weeks.   . Tell the health care staff about your symptoms: fever, cough and shortness of breath. . After you have been seen by a medical provider, you will be either: o Tested for (COVID-19) and discharged home on quarantine except to seek medical care if symptoms worsen, and asked to  - Stay home and avoid contact with others until you get your results (4-5 days)  - Avoid travel on public transportation if possible (such as bus, train, or airplane) or o Sent to the Emergency Department by EMS for evaluation, COVID-19 testing, and possible admission depending on your condition and test results.  What to do if you are LOW RISK for COVID-19?  Reduce your risk of any infection by using the same precautions used for avoiding the common cold or flu:  Marland Kitchen Wash your hands often with soap and warm water for at least 20 seconds.  If soap and water are not readily available, use an alcohol-based hand sanitizer with at least 60% alcohol.  . If  coughing or sneezing, cover your mouth and nose by coughing or sneezing into the elbow areas of your shirt or coat, into a tissue or into your sleeve (not your hands). . Avoid shaking hands with others and consider head nods or verbal greetings only. . Avoid touching your eyes, nose, or mouth with unwashed hands.  . Avoid close contact with people who are sick. . Avoid places or events with large numbers of people in one location, like concerts or  sporting events. . Carefully consider travel plans you have or are making. . If you are planning any travel outside or inside the [place], visit the CDC's Travelers' Health webpage for the latest health notices. . If you have some symptoms but not all symptoms, continue to monitor at home and seek medical attention if your symptoms worsen. . If you are having a medical emergency, call 911.   [place] / e-Visit: [email or web address]         [facility]: [facility]: [phone]                   [facility]: [phone]

## 2019-12-22 NOTE — Progress Notes (Signed)
[facility] PROGRESS NOTE 12/22/19   Megan Fire, MD [address] [place] [address]  DIAGNOSIS: 59 year old woman with anaplastic large T-cell lymphoma diagnosed in 2014.  She was found to have CD30 and ALK positive, CD20 negative disease.  She developed a relapse in December 2020.   PRIOR THERAPY:  CHOEP q 21 days started on 05/20/2013.  Completed 6 of 6 planned cycles for CHOEP on 09/05/2013.  Cycle #5 was complicated by ICU-level care secondary to severe hyperglycemia (see prior note for details).  Cycle #6, prednisone was excluded.  She received neulasta after each cycle of therapy.   She underwent autologous SCT on February 4th 2015 at [facility] ([facility] [place]).  She has not received any additional therapy after that after achieving remission.  CURRENT THERAPY: Adcetris 1.8 mg/kg every 3 weeks started with cycle 1 on 12/01/2019.  He is here for cycle 2 of therapy  INTERVAL HISTORY: Megan Mcknight is here for a follow-up.  Since her last visit, she received the first cycle of Adcetris without any major complications.  She noticed significant improvement in her overall health and performance status.  Her lymphadenopathy has decreased and she is no longer reporting any fevers or chills.  She is eating better and has gained weight.  She is back driving and attends activities of daily living.  She still experiencing few complications related to lymphoma including occasional cough and fatigue.               ALLERGIES:  is allergic to iodine and shellfish-derived products.  MEDICATIONS: Reviewed today without any changes.  Albuterol, amlodipine, metformin, metoprolol tartrate, metronidazole, montelukast, and rosuvastatin.    PHYSICAL EXAMINATION:  Blood pressure 127/82, pulse 84, temperature (!) 97.3 F (36.3 C), temperature source Temporal, resp. rate 18, height _0  (1.727 m), weight 231 lb 11.2 oz (105.1 kg), SpO2 100  %.       ECOG 1     General appearance: Comfortable appearing without any discomfort Head: Normocephalic without any trauma Oropharynx: Mucous membranes are moist and pink without any thrush or ulcers. Eyes: Pupils are equal and round reactive to light. Lymph nodes:  Decrease in her cervical lymphadenopathy barely palpable. Heart:regular rate and rhythm.  S1 and S2 without leg edema. Lung: Clear without any rhonchi or wheezes.  No dullness to percussion. Abdomin: Soft, nontender, nondistended with good bowel sounds.  No hepatosplenomegaly. Musculoskeletal: No joint deformity or effusion.  Full range of motion noted. Neurological: No deficits noted on motor, sensory and deep tendon reflex exam. Skin: No petechial rash or dryness.  Appeared moist.  Psychiatric: Mood and affect appeared appropriate.           Labs:  Lab Results  Component Value Date   WBC 4.6 12/19/2019   HGB 9.0 (L) 12/19/2019   HCT 30.6 (L) 12/19/2019   MCV 92.2 12/19/2019   PLT 184 12/19/2019   NEUTROABS 6.5 11/28/2019      Chemistry      Component Value Date/Time   NA 136 11/28/2019 0953   NA 144 09/04/2017 0916   K 4.3 11/28/2019 0953   K 4.1 09/04/2017 0916   CL 99 11/28/2019 0953   CO2 26 11/28/2019 0953   CO2 26 09/04/2017 0916   BUN 10 11/28/2019 0953   BUN 15.6 09/04/2017 0916   CREATININE 0.81 11/28/2019 0953   CREATININE 1.0 09/04/2017 0916      Component Value Date/Time   CALCIUM 9.2 11/28/2019 0953  CALCIUM 9.5 09/04/2017 0916   ALKPHOS 46 11/28/2019 0953   ALKPHOS 49 09/04/2017 0916   AST 29 11/28/2019 0953   AST 19 09/04/2017 0916   ALT 27 11/28/2019 0953   ALT 18 09/04/2017 0916   BILITOT 0.7 11/28/2019 0953   BILITOT 0.49 09/04/2017 0916       ASSESSMENT AND PLAN:    59 year old woman with:    1.  Anaplastic large cell lymphoma diagnosed in 2014 and subsequently developed relapsed disease in November 2020.   She is currently receiving salvage therapy  utilizing Adcetris while she is also under evaluation for any additional therapy in the future including stem cell transplant and CAR T cellular therapy.   Risks and benefits of continuing with the current treatment was discussed today.  Potential complications including GI complication as well as neuropathy were reviewed.  He is agreeable to continue at this time.  She is experiencing clinical benefit.  2.  IV access: Port-A-Cath inserted on January 4 without any complications at this time.  3.  Weight loss: Resolved at this time.  She is eating better.  4.  Tachycardia and debilitation: Improving with hydration as well as treating her cancer.   5.  Follow-up: Will be in 3 weeks for the next cycle of therapy.  30  minutes was spent on this encounter.  The time was dedicated to reviewing laboratory data, disease status update, treatment options and addressing complications related to current therapy.    Megan Button MD 12/22/19

## 2019-12-23 ENCOUNTER — Telehealth: Payer: Self-pay | Admitting: Oncology

## 2019-12-23 NOTE — Telephone Encounter (Signed)
Scheduled appt per 1/7 los

## 2019-12-26 ENCOUNTER — Telehealth: Payer: Self-pay | Admitting: *Deleted

## 2019-12-26 NOTE — Telephone Encounter (Signed)
No changes for now. If needed, we can give her note to return to work next year.

## 2019-12-26 NOTE — Telephone Encounter (Signed)
Provider information shared with patient.    "I wrote on the other form is why I called.  I also spoke with [employer] who said turn form in as is.  I left a blank form today if needed."  Encouraged to proceed to turn in form as instructed by [employer] and provider.

## 2019-12-26 NOTE — Telephone Encounter (Signed)
"  The "[employer]" form for bill pay signed by Dr. Alen Blew on my visit last Thursday reads disability as indefinite, never returning to work.  I thought I was to be out of work over the next year and a half.  Form has to be submitted today.  Do I change it or do I need to bring it in today for him to change/correct?"

## 2020-01-12 ENCOUNTER — Inpatient Hospital Stay: Payer: BC Managed Care – PPO

## 2020-01-12 ENCOUNTER — Other Ambulatory Visit: Payer: Self-pay

## 2020-01-12 ENCOUNTER — Inpatient Hospital Stay (HOSPITAL_BASED_OUTPATIENT_CLINIC_OR_DEPARTMENT_OTHER): Payer: BC Managed Care – PPO | Admitting: Oncology

## 2020-01-12 VITALS — BP 120/80 | HR 78 | Temp 97.8°F | Resp 17 | Ht 68.0 in | Wt 224.0 lb

## 2020-01-12 DIAGNOSIS — Z95828 Presence of other vascular implants and grafts: Secondary | ICD-10-CM

## 2020-01-12 DIAGNOSIS — C8460 Anaplastic large cell lymphoma, ALK-positive, unspecified site: Secondary | ICD-10-CM | POA: Diagnosis not present

## 2020-01-12 DIAGNOSIS — Z5112 Encounter for antineoplastic immunotherapy: Secondary | ICD-10-CM | POA: Diagnosis not present

## 2020-01-12 DIAGNOSIS — C8100 Nodular lymphocyte predominant Hodgkin lymphoma, unspecified site: Secondary | ICD-10-CM

## 2020-01-12 LAB — CBC WITH DIFFERENTIAL (CANCER CENTER ONLY)
Abs Immature Granulocytes: 0.02 10*3/uL (ref 0.00–0.07)
Basophils Absolute: 0 10*3/uL (ref 0.0–0.1)
Basophils Relative: 1 %
Eosinophils Absolute: 0 10*3/uL (ref 0.0–0.5)
Eosinophils Relative: 1 %
HCT: 31.9 % — ABNORMAL LOW (ref 36.0–46.0)
Hemoglobin: 10.1 g/dL — ABNORMAL LOW (ref 12.0–15.0)
Immature Granulocytes: 0 %
Lymphocytes Relative: 32 %
Lymphs Abs: 1.6 10*3/uL (ref 0.7–4.0)
MCH: 27.4 pg (ref 26.0–34.0)
MCHC: 31.7 g/dL (ref 30.0–36.0)
MCV: 86.7 fL (ref 80.0–100.0)
Monocytes Absolute: 0.6 10*3/uL (ref 0.1–1.0)
Monocytes Relative: 12 %
Neutro Abs: 2.7 10*3/uL (ref 1.7–7.7)
Neutrophils Relative %: 54 %
Platelet Count: 217 10*3/uL (ref 150–400)
RBC: 3.68 MIL/uL — ABNORMAL LOW (ref 3.87–5.11)
RDW: 18.6 % — ABNORMAL HIGH (ref 11.5–15.5)
WBC Count: 5 10*3/uL (ref 4.0–10.5)
nRBC: 0 % (ref 0.0–0.2)

## 2020-01-12 LAB — CMP (CANCER CENTER ONLY)
ALT: 22 U/L (ref 0–44)
AST: 15 U/L (ref 15–41)
Albumin: 3.8 g/dL (ref 3.5–5.0)
Alkaline Phosphatase: 56 U/L (ref 38–126)
Anion gap: 10 (ref 5–15)
BUN: 10 mg/dL (ref 6–20)
CO2: 24 mmol/L (ref 22–32)
Calcium: 9 mg/dL (ref 8.9–10.3)
Chloride: 108 mmol/L (ref 98–111)
Creatinine: 0.82 mg/dL (ref 0.44–1.00)
GFR, Est AFR Am: >60 mL/min (ref >60)
GFR, Estimated: >60 mL/min (ref >60)
Glucose, Bld: 223 mg/dL — ABNORMAL HIGH (ref 70–99)
Potassium: 3.7 mmol/L (ref 3.5–5.1)
Sodium: 142 mmol/L (ref 135–145)
Total Bilirubin: 0.5 mg/dL (ref 0.3–1.2)
Total Protein: 6.9 g/dL (ref 6.5–8.1)

## 2020-01-12 LAB — SAMPLE TO BLOOD BANK

## 2020-01-12 MED ORDER — DIPHENHYDRAMINE HCL 50 MG/ML IJ SOLN
INTRAMUSCULAR | Status: AC
  Filled 2020-01-12: qty 1

## 2020-01-12 MED ORDER — SODIUM CHLORIDE 0.9% FLUSH
10.0000 mL | Freq: Once | INTRAVENOUS | Status: AC
  Administered 2020-01-12: 10 mL
  Filled 2020-01-12: qty 10

## 2020-01-12 MED ORDER — ACETAMINOPHEN 325 MG PO TABS
ORAL_TABLET | ORAL | Status: AC
  Filled 2020-01-12: qty 2

## 2020-01-12 MED ORDER — DIPHENHYDRAMINE HCL 50 MG/ML IJ SOLN
50.0000 mg | Freq: Once | INTRAMUSCULAR | Status: AC
  Administered 2020-01-12: 50 mg via INTRAVENOUS

## 2020-01-12 MED ORDER — HEPARIN SOD (PORK) LOCK FLUSH 100 UNIT/ML IV SOLN
500.0000 [IU] | Freq: Once | INTRAVENOUS | Status: AC | PRN
  Administered 2020-01-12: 500 [IU]
  Filled 2020-01-12: qty 5

## 2020-01-12 MED ORDER — SODIUM CHLORIDE 0.9 % IV SOLN
Freq: Once | INTRAVENOUS | Status: AC
  Filled 2020-01-12: qty 250

## 2020-01-12 MED ORDER — DEXAMETHASONE SODIUM PHOSPHATE 10 MG/ML IJ SOLN
INTRAMUSCULAR | Status: AC
  Filled 2020-01-12: qty 1

## 2020-01-12 MED ORDER — SODIUM CHLORIDE 0.9 % IV SOLN
150.0000 mg | Freq: Once | INTRAVENOUS | Status: AC
  Administered 2020-01-12: 150 mg via INTRAVENOUS
  Filled 2020-01-12: qty 30

## 2020-01-12 MED ORDER — ACETAMINOPHEN 325 MG PO TABS
650.0000 mg | ORAL_TABLET | Freq: Once | ORAL | Status: AC
  Administered 2020-01-12: 650 mg via ORAL

## 2020-01-12 MED ORDER — DEXAMETHASONE SODIUM PHOSPHATE 10 MG/ML IJ SOLN
10.0000 mg | Freq: Once | INTRAMUSCULAR | Status: AC
  Administered 2020-01-12: 10 mg via INTRAVENOUS

## 2020-01-12 MED ORDER — SODIUM CHLORIDE 0.9% FLUSH
10.0000 mL | INTRAVENOUS | Status: DC | PRN
  Administered 2020-01-12: 10 mL
  Filled 2020-01-12: qty 10

## 2020-01-12 NOTE — Patient Instructions (Signed)
Brentuximab vedotin solution for injection What is this medicine? BRENTUXIMAB VEDOTIN (bren TUX see mab ve DOE tin) is a monoclonal antibody and a chemotherapy drug. It is used for treating Hodgkin lymphoma and certain non-Hodgkin lymphomas, such as anaplastic large-cell lymphoma, mycosis fungoides, and peripheral T-cell lymphoma. This medicine may be used for other purposes; ask your health care provider or pharmacist if you have questions. COMMON BRAND NAME(S): ADCETRIS What should I tell my health care provider before I take this medicine? They need to know if you have any of these conditions:  immune system problems  infection (especially a virus infection such as chickenpox, cold sores, or herpes)  kidney disease  liver disease  low blood counts, like low white cell, platelet, or red cell counts  tingling of the fingers or toes, or other nerve disorder  an unusual or allergic reaction to brentuximab vedotin, other medicines, foods, dyes, or preservatives  pregnant or trying to get pregnant  breast-feeding How should I use this medicine? This medicine is for infusion into a vein. It is given by a health care professional in a hospital or clinic setting. Talk to your pediatrician regarding the use of this medicine in children. Special care may be needed. Overdosage: If you think you have taken too much of this medicine contact a poison control center or emergency room at once. NOTE: This medicine is only for you. Do not share this medicine with others. What if I miss a dose? It is important not to miss your dose. Call your doctor or health care professional if you are unable to keep an appointment. What may interact with this medicine? This medicine may interact with the following medications:  ketoconazole  rifampin  St. John's wort; Hypericum perforatum This list may not describe all possible interactions. Give your health care provider a list of all the medicines, herbs,  non-prescription drugs, or dietary supplements you use. Also tell them if you smoke, drink alcohol, or use illegal drugs. Some items may interact with your medicine. What should I watch for while using this medicine? Visit your doctor for checks on your progress. This drug may make you feel generally unwell. Report any side effects. Continue your course of treatment even though you feel ill unless your doctor tells you to stop. Call your doctor or health care professional for advice if you get a fever, chills or sore throat, or other symptoms of a cold or flu. Do not treat yourself. This drug decreases your body's ability to fight infections. Try to avoid being around people who are sick. This medicine may increase your risk to bruise or bleed. Call your doctor or health care professional if you notice any unusual bleeding. In some patients, this medicine may cause a serious brain infection that may cause death. If you have any problems seeing, thinking, speaking, walking, or standing, tell your doctor right away. If you cannot reach your doctor, urgently seek other source of medical care. Do not become pregnant while taking this medicine or for 6 months after stopping it. Women should inform their doctor if they wish to become pregnant or think they might be pregnant. Men should not father a child while taking this medicine and for 6 months after stopping it. There is a potential for serious side effects to an unborn child. Talk to your health care professional or pharmacist for more information. Do not breast-feed an infant while taking this medicine. This may interfere with the ability to father a child. You  should talk to your doctor or health care professional if you are concerned about your fertility. What side effects may I notice from receiving this medicine? Side effects that you should report to your doctor or health care professional as soon as possible:  allergic reactions like skin rash,  itching or hives, swelling of the face, lips, or tongue  changes in emotions or moods  diarrhea  low blood counts - this medicine may decrease the number of white blood cells, red blood cells and platelets. You may be at increased risk for infections and bleeding.  pain, tingling, numbness in the hands or feet  redness, blistering, peeling or loosening of the skin, including inside the mouth  shortness of breath  signs of infection - fever or chills, cough, sore throat, pain or difficulty passing urine  signs of decreased platelets or bleeding - bruising, pinpoint red spots on the skin, black, tarry stools, blood in the urine  signs of decreased red blood cells - unusually weak or tired, fainting spells, lightheadedness  signs of liver injury like dark yellow or brown urine; general ill feeling or flu-like symptoms; light-colored stools; loss of appetite; nausea; right upper belly pain; yellowing of the eyes or skin  stomach pain  sudden numbness or weakness of the face, arm or leg  vomiting Side effects that usually do not require medical attention (report to your doctor or health care professional if they continue or are bothersome):  constipation  dizziness  headache  muscle pain  tiredness This list may not describe all possible side effects. Call your doctor for medical advice about side effects. You may report side effects to FDA at 1-800-FDA-1088. Where should I keep my medicine? This drug is given in a hospital or clinic and will not be stored at home. NOTE: This sheet is a summary. It may not cover all possible information. If you have questions about this medicine, talk to your doctor, pharmacist, or health care provider.  2020 Elsevier/Gold Standard (2017-11-02 14:10:02)  Coronavirus (COVID-19) Are you at risk?  Are you at risk for the Coronavirus (COVID-19)?  To be considered HIGH RISK for Coronavirus (COVID-19), you have to meet the following  criteria:  . Traveled to [place], [place], [place], [place] or [place]; or in the [place] to [place], [place], [place], or [place]; and have fever, cough, and shortness of breath within the last 2 weeks of travel OR . Been in close contact with a person diagnosed with COVID-19 within the last 2 weeks and have fever, cough, and shortness of breath . IF YOU DO NOT MEET THESE CRITERIA, YOU ARE CONSIDERED LOW RISK FOR COVID-19.  What to do if you are HIGH RISK for COVID-19?  Marland Kitchen If you are having a medical emergency, call 911. . Seek medical care right away. Before you go to a doctor's office, urgent care or emergency department, call ahead and tell them about your recent travel, contact with someone diagnosed with COVID-19, and your symptoms. You should receive instructions from your physician's office regarding next steps of care.  . When you arrive at healthcare provider, tell the healthcare staff immediately you have returned from visiting [place], [place], [place], [place] or [place]; or traveled in the [place] to [place], [place], [place], or [place]; in the last two weeks or you have been in close contact with a person diagnosed with COVID-19 in the last 2 weeks.   . Tell the health care staff about  your symptoms: fever, cough and shortness of breath. . After you have been seen by a medical provider, you will be either: o Tested for (COVID-19) and discharged home on quarantine except to seek medical care if symptoms worsen, and asked to  - Stay home and avoid contact with others until you get your results (4-5 days)  - Avoid travel on public transportation if possible (such as bus, train, or airplane) or o Sent to the Emergency Department by EMS for evaluation, COVID-19 testing, and possible admission depending on your condition and test results.  What to do if you are LOW RISK for COVID-19?  Reduce your risk of any infection by using the same precautions used for  avoiding the common cold or flu:  Marland Kitchen Wash your hands often with soap and warm water for at least 20 seconds.  If soap and water are not readily available, use an alcohol-based hand sanitizer with at least 60% alcohol.  . If coughing or sneezing, cover your mouth and nose by coughing or sneezing into the elbow areas of your shirt or coat, into a tissue or into your sleeve (not your hands). . Avoid shaking hands with others and consider head nods or verbal greetings only. . Avoid touching your eyes, nose, or mouth with unwashed hands.  . Avoid close contact with people who are sick. . Avoid places or events with large numbers of people in one location, like concerts or sporting events. . Carefully consider travel plans you have or are making. . If you are planning any travel outside or inside the [place], visit the CDC's Travelers' Health webpage for the latest health notices. . If you have some symptoms but not all symptoms, continue to monitor at home and seek medical attention if your symptoms worsen. . If you are having a medical emergency, call 911.   [place] / e-Visit: [email or web address]         [facility]: [facility]: [phone]                   [facility]: [phone]

## 2020-01-12 NOTE — Progress Notes (Signed)
[facility]COL[facility] OFFICE PROGRESS NOTE 01/12/20   Megan Fire, MD [address] [place] [address]  DIAGNOSIS: 59 year old woman with relapsed T-cell lymphoma noted in December 2020.  He was initially diagnosed with anaplastic large T-cell lymphoma,  CD30 and ALK positive, CD20 negative disease in 2014.     PRIOR THERAPY:  CHOEP q 21 days started on 05/20/2013.  Completed 6 of 6 planned cycles for CHOEP on 09/05/2013.  Cycle #5 was complicated by ICU-level care secondary to severe hyperglycemia (see prior note for details).  Cycle #6, prednisone was excluded.  She received neulasta after each cycle of therapy.   She underwent autologous SCT on February 4th 2015 at [facility] ([facility] [place]).  She has not received any additional therapy after that after achieving remission.  CURRENT THERAPY: Adcetris 1.8 mg/kg every 3 weeks started with cycle 1 on 12/01/2019.  He is here for cycle 3.  INTERVAL HISTORY: [name] presents today for a repeat evaluation.  Since the last visit, she continues to do well without any recent complaints.  She has tolerated treatments without any new complications.  She denies any nausea, vomiting or abdominal pain.  She denies any lymphadenopathy or constitutional symptoms.  Her performance status activity level remained excellent.               ALLERGIES:  is allergic to iodine and shellfish-derived products.  MEDICATIONS: Updated and reviewed.    PHYSICAL EXAMINATION:    Blood pressure 120/80, pulse 78, temperature 97.8 F (36.6 C), temperature source Temporal, resp. rate 17, height _0  (1.727 m), weight 224 lb (101.6 kg), SpO2 100 %.      ECOG 1   General appearance: Alert, awake without any distress. Head: Atraumatic without abnormalities Oropharynx: Without any thrush or ulcers. Eyes: No scleral icterus. Lymph nodes: No lymphadenopathy palpated in the cervical region.  No other adenopathy  noted. Heart:regular rate and rhythm, without any murmurs or gallops.   Lung: Clear to auscultation without any rhonchi, wheezes or dullness to percussion. Abdomin: Soft, nontender without any shifting dullness or ascites. Musculoskeletal: No clubbing or cyanosis. Neurological: No motor or sensory deficits. Skin: No rashes or lesions.           Labs:  Lab Results  Component Value Date   WBC 4.8 12/22/2019   HGB 8.8 (L) 12/22/2019   HCT 28.7 (L) 12/22/2019   MCV 87.2 12/22/2019   PLT 191 12/22/2019   NEUTROABS 2.4 12/22/2019      Chemistry      Component Value Date/Time   NA 144 12/22/2019 0939   NA 144 09/04/2017 0916   K 3.7 12/22/2019 0939   K 4.1 09/04/2017 0916   CL 109 12/22/2019 0939   CO2 25 12/22/2019 0939   CO2 26 09/04/2017 0916   BUN 10 12/22/2019 0939   BUN 15.6 09/04/2017 0916   CREATININE 0.69 12/22/2019 0939   CREATININE 1.0 09/04/2017 0916      Component Value Date/Time   CALCIUM 8.9 12/22/2019 0939   CALCIUM 9.5 09/04/2017 0916   ALKPHOS 46 12/22/2019 0939   ALKPHOS 49 09/04/2017 0916   AST 23 12/22/2019 0939   AST 19 09/04/2017 0916   ALT 34 12/22/2019 0939   ALT 18 09/04/2017 0916   BILITOT 0.6 12/22/2019 0939   BILITOT 0.49 09/04/2017 0916       ASSESSMENT AND PLAN:    59 year old woman with:    1.  Relapsed anaplastic large T cell  lymphoma noted in December 2020.  She was initially diagnosed in 2014.    She is currently on Adcetris which she has tolerated reasonably well with excellent clinical benefit.  She has reported decrease in her lymphadenopathy as well as constitutional symptoms including fevers and fatigue.  Risks and benefits of continuing this therapy was discussed.  The plan is to proceed with cycle 3 without any dose reduction or delay.  Plan to repeat imaging studies after cycle 4.  2.  IV access: Port-A-Cath remains in use without any issues.  3.  Weight loss: Improved since the treatment of her disease.  4.   Anemia: Related to her lymphoma as well as treatment.  Hemoglobin has improved at this time and does not require any transfusion or intervention.   5.  Follow-up: She will return in 3 weeks for repeat evaluation.  30  minutes was dedicated to to this visit.  The time was spent on reviewing her disease status, reviewing laboratory data as well as addressing complications related therapy.    Zola Button MD 01/12/20

## 2020-01-13 ENCOUNTER — Telehealth: Payer: Self-pay | Admitting: Oncology

## 2020-01-13 NOTE — Telephone Encounter (Signed)
Scheduled appt per 1/28 los.

## 2020-01-19 ENCOUNTER — Encounter: Payer: Self-pay | Admitting: Oncology

## 2020-01-19 NOTE — Progress Notes (Signed)
Called patient referred by [facility] RN whom had some financial concerns.  Introduced myself as Arboriculturist and offered available resources.  Discussed one-time $40 Engineer, drilling to assist with gas cards and personal household expenses while going through treatment. Based on verbal income provided, patient does qualify. I will meet with her on 7/71 to complete application and go over expense details.  She has my contact name and number for any additional financial questions or concerns.

## 2020-02-01 ENCOUNTER — Telehealth: Payer: Self-pay

## 2020-02-01 NOTE — Telephone Encounter (Signed)
Called and informed patient of delayed opening of Megan Mcknight due to impending weather. Patient informed her appointments will be rescheduled, with her first appointment starting at 10:30am. Patient verbalized understanding.

## 2020-02-02 ENCOUNTER — Encounter: Payer: Self-pay | Admitting: Oncology

## 2020-02-02 ENCOUNTER — Other Ambulatory Visit: Payer: Self-pay

## 2020-02-02 ENCOUNTER — Inpatient Hospital Stay: Payer: BC Managed Care – PPO

## 2020-02-02 ENCOUNTER — Inpatient Hospital Stay (HOSPITAL_BASED_OUTPATIENT_CLINIC_OR_DEPARTMENT_OTHER): Payer: BC Managed Care – PPO | Admitting: Oncology

## 2020-02-02 ENCOUNTER — Inpatient Hospital Stay: Payer: BC Managed Care – PPO | Admitting: Oncology

## 2020-02-02 ENCOUNTER — Inpatient Hospital Stay: Payer: BC Managed Care – PPO | Attending: Oncology

## 2020-02-02 VITALS — BP 128/80 | HR 78 | Temp 98.2°F | Resp 18 | Ht 68.0 in | Wt 222.1 lb

## 2020-02-02 DIAGNOSIS — C8100 Nodular lymphocyte predominant Hodgkin lymphoma, unspecified site: Secondary | ICD-10-CM

## 2020-02-02 DIAGNOSIS — D649 Anemia, unspecified: Secondary | ICD-10-CM | POA: Insufficient documentation

## 2020-02-02 DIAGNOSIS — C8460 Anaplastic large cell lymphoma, ALK-positive, unspecified site: Secondary | ICD-10-CM | POA: Diagnosis not present

## 2020-02-02 DIAGNOSIS — Z95828 Presence of other vascular implants and grafts: Secondary | ICD-10-CM

## 2020-02-02 DIAGNOSIS — Z5112 Encounter for antineoplastic immunotherapy: Secondary | ICD-10-CM | POA: Diagnosis present

## 2020-02-02 LAB — CBC WITH DIFFERENTIAL (CANCER CENTER ONLY)
Abs Immature Granulocytes: 0.01 10*3/uL (ref 0.00–0.07)
Basophils Absolute: 0 10*3/uL (ref 0.0–0.1)
Basophils Relative: 0 %
Eosinophils Absolute: 0.1 10*3/uL (ref 0.0–0.5)
Eosinophils Relative: 1 %
HCT: 33 % — ABNORMAL LOW (ref 36.0–46.0)
Hemoglobin: 10.4 g/dL — ABNORMAL LOW (ref 12.0–15.0)
Immature Granulocytes: 0 %
Lymphocytes Relative: 35 %
Lymphs Abs: 1.8 10*3/uL (ref 0.7–4.0)
MCH: 27.6 pg (ref 26.0–34.0)
MCHC: 31.5 g/dL (ref 30.0–36.0)
MCV: 87.5 fL (ref 80.0–100.0)
Monocytes Absolute: 0.4 10*3/uL (ref 0.1–1.0)
Monocytes Relative: 8 %
Neutro Abs: 2.9 10*3/uL (ref 1.7–7.7)
Neutrophils Relative %: 56 %
Platelet Count: 203 10*3/uL (ref 150–400)
RBC: 3.77 MIL/uL — ABNORMAL LOW (ref 3.87–5.11)
RDW: 17.5 % — ABNORMAL HIGH (ref 11.5–15.5)
WBC Count: 5.3 10*3/uL (ref 4.0–10.5)
nRBC: 0 % (ref 0.0–0.2)

## 2020-02-02 LAB — CMP (CANCER CENTER ONLY)
ALT: 17 U/L (ref 0–44)
AST: 14 U/L — ABNORMAL LOW (ref 15–41)
Albumin: 3.6 g/dL (ref 3.5–5.0)
Alkaline Phosphatase: 56 U/L (ref 38–126)
Anion gap: 7 (ref 5–15)
BUN: 11 mg/dL (ref 6–20)
CO2: 24 mmol/L (ref 22–32)
Calcium: 9 mg/dL (ref 8.9–10.3)
Chloride: 110 mmol/L (ref 98–111)
Creatinine: 0.89 mg/dL (ref 0.44–1.00)
GFR, Est AFR Am: >60 mL/min (ref >60)
GFR, Estimated: >60 mL/min (ref >60)
Glucose, Bld: 331 mg/dL — ABNORMAL HIGH (ref 70–99)
Potassium: 3.8 mmol/L (ref 3.5–5.1)
Sodium: 141 mmol/L (ref 135–145)
Total Bilirubin: 0.4 mg/dL (ref 0.3–1.2)
Total Protein: 6.6 g/dL (ref 6.5–8.1)

## 2020-02-02 LAB — SAMPLE TO BLOOD BANK

## 2020-02-02 MED ORDER — SODIUM CHLORIDE 0.9% FLUSH
10.0000 mL | Freq: Once | INTRAVENOUS | Status: DC
  Filled 2020-02-02: qty 10

## 2020-02-02 MED ORDER — DIPHENHYDRAMINE HCL 50 MG/ML IJ SOLN
INTRAMUSCULAR | Status: AC
  Filled 2020-02-02: qty 1

## 2020-02-02 MED ORDER — ACETAMINOPHEN 325 MG PO TABS
650.0000 mg | ORAL_TABLET | Freq: Once | ORAL | Status: AC
  Administered 2020-02-02: 650 mg via ORAL

## 2020-02-02 MED ORDER — DEXAMETHASONE SODIUM PHOSPHATE 10 MG/ML IJ SOLN
10.0000 mg | Freq: Once | INTRAMUSCULAR | Status: AC
  Administered 2020-02-02: 10 mg via INTRAVENOUS

## 2020-02-02 MED ORDER — SODIUM CHLORIDE 0.9% FLUSH
10.0000 mL | INTRAVENOUS | Status: DC | PRN
  Administered 2020-02-02: 10 mL
  Filled 2020-02-02: qty 10

## 2020-02-02 MED ORDER — ACETAMINOPHEN 325 MG PO TABS
ORAL_TABLET | ORAL | Status: AC
  Filled 2020-02-02: qty 2

## 2020-02-02 MED ORDER — HEPARIN SOD (PORK) LOCK FLUSH 100 UNIT/ML IV SOLN
500.0000 [IU] | Freq: Once | INTRAVENOUS | Status: AC | PRN
  Administered 2020-02-02: 500 [IU]
  Filled 2020-02-02: qty 5

## 2020-02-02 MED ORDER — SODIUM CHLORIDE 0.9 % IV SOLN
Freq: Once | INTRAVENOUS | Status: AC
  Filled 2020-02-02: qty 250

## 2020-02-02 MED ORDER — DEXAMETHASONE SODIUM PHOSPHATE 10 MG/ML IJ SOLN
INTRAMUSCULAR | Status: AC
  Filled 2020-02-02: qty 1

## 2020-02-02 MED ORDER — DIPHENHYDRAMINE HCL 50 MG/ML IJ SOLN
50.0000 mg | Freq: Once | INTRAMUSCULAR | Status: AC
  Administered 2020-02-02: 13:00:00 50 mg via INTRAVENOUS

## 2020-02-02 MED ORDER — SODIUM CHLORIDE 0.9 % IV SOLN
180.0000 mg | Freq: Once | INTRAVENOUS | Status: AC
  Administered 2020-02-02: 180 mg via INTRAVENOUS
  Filled 2020-02-02: qty 36

## 2020-02-02 NOTE — Patient Instructions (Signed)
Brentuximab vedotin solution for injection What is this medicine? BRENTUXIMAB VEDOTIN (bren TUX see mab ve DOE tin) is a monoclonal antibody and a chemotherapy drug. It is used for treating Hodgkin lymphoma and certain non-Hodgkin lymphomas, such as anaplastic large-cell lymphoma, mycosis fungoides, and peripheral T-cell lymphoma. This medicine may be used for other purposes; ask your health care provider or pharmacist if you have questions. COMMON BRAND NAME(S): ADCETRIS What should I tell my health care provider before I take this medicine? They need to know if you have any of these conditions:  immune system problems  infection (especially a virus infection such as chickenpox, cold sores, or herpes)  kidney disease  liver disease  low blood counts, like low white cell, platelet, or red cell counts  tingling of the fingers or toes, or other nerve disorder  an unusual or allergic reaction to brentuximab vedotin, other medicines, foods, dyes, or preservatives  pregnant or trying to get pregnant  breast-feeding How should I use this medicine? This medicine is for infusion into a vein. It is given by a health care professional in a hospital or clinic setting. Talk to your pediatrician regarding the use of this medicine in children. Special care may be needed. Overdosage: If you think you have taken too much of this medicine contact a poison control center or emergency room at once. NOTE: This medicine is only for you. Do not share this medicine with others. What if I miss a dose? It is important not to miss your dose. Call your doctor or health care professional if you are unable to keep an appointment. What may interact with this medicine? This medicine may interact with the following medications:  ketoconazole  rifampin  St. John's wort; Hypericum perforatum This list may not describe all possible interactions. Give your health care provider a list of all the medicines, herbs,  non-prescription drugs, or dietary supplements you use. Also tell them if you smoke, drink alcohol, or use illegal drugs. Some items may interact with your medicine. What should I watch for while using this medicine? Visit your doctor for checks on your progress. This drug may make you feel generally unwell. Report any side effects. Continue your course of treatment even though you feel ill unless your doctor tells you to stop. Call your doctor or health care professional for advice if you get a fever, chills or sore throat, or other symptoms of a cold or flu. Do not treat yourself. This drug decreases your body's ability to fight infections. Try to avoid being around people who are sick. This medicine may increase your risk to bruise or bleed. Call your doctor or health care professional if you notice any unusual bleeding. In some patients, this medicine may cause a serious brain infection that may cause death. If you have any problems seeing, thinking, speaking, walking, or standing, tell your doctor right away. If you cannot reach your doctor, urgently seek other source of medical care. Do not become pregnant while taking this medicine or for 6 months after stopping it. Women should inform their doctor if they wish to become pregnant or think they might be pregnant. Men should not father a child while taking this medicine and for 6 months after stopping it. There is a potential for serious side effects to an unborn child. Talk to your health care professional or pharmacist for more information. Do not breast-feed an infant while taking this medicine. This may interfere with the ability to father a child. You  should talk to your doctor or health care professional if you are concerned about your fertility. What side effects may I notice from receiving this medicine? Side effects that you should report to your doctor or health care professional as soon as possible:  allergic reactions like skin rash,  itching or hives, swelling of the face, lips, or tongue  changes in emotions or moods  diarrhea  low blood counts - this medicine may decrease the number of white blood cells, red blood cells and platelets. You may be at increased risk for infections and bleeding.  pain, tingling, numbness in the hands or feet  redness, blistering, peeling or loosening of the skin, including inside the mouth  shortness of breath  signs of infection - fever or chills, cough, sore throat, pain or difficulty passing urine  signs of decreased platelets or bleeding - bruising, pinpoint red spots on the skin, black, tarry stools, blood in the urine  signs of decreased red blood cells - unusually weak or tired, fainting spells, lightheadedness  signs of liver injury like dark yellow or brown urine; general ill feeling or flu-like symptoms; light-colored stools; loss of appetite; nausea; right upper belly pain; yellowing of the eyes or skin  stomach pain  sudden numbness or weakness of the face, arm or leg  vomiting Side effects that usually do not require medical attention (report to your doctor or health care professional if they continue or are bothersome):  constipation  dizziness  headache  muscle pain  tiredness This list may not describe all possible side effects. Call your doctor for medical advice about side effects. You may report side effects to FDA at 1-800-FDA-1088. Where should I keep my medicine? This drug is given in a hospital or clinic and will not be stored at home. NOTE: This sheet is a summary. It may not cover all possible information. If you have questions about this medicine, talk to your doctor, pharmacist, or health care provider.  2020 Elsevier/Gold Standard (2017-11-02 14:10:02)  Coronavirus (COVID-19) Are you at risk?  Are you at risk for the Coronavirus (COVID-19)?  To be considered HIGH RISK for Coronavirus (COVID-19), you have to meet the following  criteria:  . Traveled to [place], [place], [place], [place] or [place]; or in the [place] to [place], [place], [place], or [place]; and have fever, cough, and shortness of breath within the last 2 weeks of travel OR . Been in close contact with a person diagnosed with COVID-19 within the last 2 weeks and have fever, cough, and shortness of breath . IF YOU DO NOT MEET THESE CRITERIA, YOU ARE CONSIDERED LOW RISK FOR COVID-19.  What to do if you are HIGH RISK for COVID-19?  Marland Kitchen If you are having a medical emergency, call 911. . Seek medical care right away. Before you go to a doctor's office, urgent care or emergency department, call ahead and tell them about your recent travel, contact with someone diagnosed with COVID-19, and your symptoms. You should receive instructions from your physician's office regarding next steps of care.  . When you arrive at healthcare provider, tell the healthcare staff immediately you have returned from visiting [place], [place], [place], [place] or [place]; or traveled in the [place] to [place], [place], [place], or [place]; in the last two weeks or you have been in close contact with a person diagnosed with COVID-19 in the last 2 weeks.   . Tell the health care staff about  your symptoms: fever, cough and shortness of breath. . After you have been seen by a medical provider, you will be either: o Tested for (COVID-19) and discharged home on quarantine except to seek medical care if symptoms worsen, and asked to  - Stay home and avoid contact with others until you get your results (4-5 days)  - Avoid travel on public transportation if possible (such as bus, train, or airplane) or o Sent to the Emergency Department by EMS for evaluation, COVID-19 testing, and possible admission depending on your condition and test results.  What to do if you are LOW RISK for COVID-19?  Reduce your risk of any infection by using the same precautions used for  avoiding the common cold or flu:  Marland Kitchen Wash your hands often with soap and warm water for at least 20 seconds.  If soap and water are not readily available, use an alcohol-based hand sanitizer with at least 60% alcohol.  . If coughing or sneezing, cover your mouth and nose by coughing or sneezing into the elbow areas of your shirt or coat, into a tissue or into your sleeve (not your hands). . Avoid shaking hands with others and consider head nods or verbal greetings only. . Avoid touching your eyes, nose, or mouth with unwashed hands.  . Avoid close contact with people who are sick. . Avoid places or events with large numbers of people in one location, like concerts or sporting events. . Carefully consider travel plans you have or are making. . If you are planning any travel outside or inside the [place], visit the CDC's Travelers' Health webpage for the latest health notices. . If you have some symptoms but not all symptoms, continue to monitor at home and seek medical attention if your symptoms worsen. . If you are having a medical emergency, call 911.   [place] / e-Visit: [email or web address]         [facility]: [facility]: [phone]                   [facility]: [phone]

## 2020-02-02 NOTE — Progress Notes (Signed)
Met with patient to obtain income and signature for grant.  Patient approved for one-time $1000 [employer] grant to assist with personal expenses while going through treatment. She has a copy of the approval letter as well as the expense sheet and Outpatient pharmacy information. She received a gas card today from the grant.  She has my card for any additional financial questions or concerns.

## 2020-02-02 NOTE — Progress Notes (Signed)
[facility] PROGRESS NOTE 02/02/20   Rosita Fire, MD [address] [place] [address]  DIAGNOSIS: 59 year old woman with anaplastic large T-cell lymphoma diagnosed in 2014.  She was found to have CD30 and ALK positive, CD20 negative disease with documented a relapse and 2020.   PRIOR THERAPY:  CHOEP q 21 days started on 05/20/2013.  Completed 6 of 6 planned cycles for CHOEP on 09/05/2013.  Cycle #5 was complicated by ICU-level care secondary to severe hyperglycemia (see prior note for details).  Cycle #6, prednisone was excluded.  She received neulasta after each cycle of therapy.   She underwent autologous SCT on February 4th 2015 at [facility] [place]  She has not received any additional therapy after that after achieving remission.  CURRENT THERAPY: Adcetris 1.8 mg/kg every 3 weeks started with cycle 1 on 12/01/2019.  He is here for cycle 4.  INTERVAL HISTORY: Ms. Mathurin returns today for a follow-up.  Since the last visit, she reports no major issues or concerns. She continues to tolerate her therapy without any annual side effects. She denies any nausea, vomiting or abdominal pain. She denies any worsening neuropathy or fatigue. Continues to attempt activities of daily living without any issues. Her performance status and quality of life remained excellent. She denies any fevers or painful adenopathy.              ALLERGIES:  is allergic to iodine and shellfish-derived products.  MEDICATIONS: Unchanged on review today.   PHYSICAL EXAMINATION:     Blood pressure 128/80, pulse 78, temperature 98.2 F (36.8 C), temperature source Temporal, resp. rate 18, height '5\' 8"'$  (1.727 m), weight 222 lb 1.6 oz (100.7 kg), SpO2 99 %.     ECOG 1    General appearance: Comfortable appearing without any discomfort Head: Normocephalic without any trauma Oropharynx: Mucous membranes are moist and pink without any thrush or  ulcers. Eyes: Pupils are equal and round reactive to light. Lymph nodes: No cervical, supraclavicular, inguinal or axillary lymphadenopathy.   Heart:regular rate and rhythm.  S1 and S2 without leg edema. Lung: Clear without any rhonchi or wheezes.  No dullness to percussion. Abdomin: Soft, nontender, nondistended with good bowel sounds.  No hepatosplenomegaly. Musculoskeletal: No joint deformity or effusion.  Full range of motion noted. Neurological: No deficits noted on motor, sensory and deep tendon reflex exam. Skin: No petechial rash or dryness.  Appeared moist.             Labs:  Lab Results  Component Value Date   WBC 5.0 01/12/2020   HGB 10.1 (L) 01/12/2020   HCT 31.9 (L) 01/12/2020   MCV 86.7 01/12/2020   PLT 217 01/12/2020   NEUTROABS 2.7 01/12/2020      Chemistry      Component Value Date/Time   NA 142 01/12/2020 1148   NA 144 09/04/2017 0916   K 3.7 01/12/2020 1148   K 4.1 09/04/2017 0916   CL 108 01/12/2020 1148   CO2 24 01/12/2020 1148   CO2 26 09/04/2017 0916   BUN 10 01/12/2020 1148   BUN 15.6 09/04/2017 0916   CREATININE 0.82 01/12/2020 1148   CREATININE 1.0 09/04/2017 0916      Component Value Date/Time   CALCIUM 9.0 01/12/2020 1148   CALCIUM 9.5 09/04/2017 0916   ALKPHOS 56 01/12/2020 1148   ALKPHOS 49 09/04/2017 0916   AST 15 01/12/2020 1148   AST 19 09/04/2017 0916   ALT 22 01/12/2020  1148   ALT 18 09/04/2017 0916   BILITOT 0.5 01/12/2020 1148   BILITOT 0.49 09/04/2017 0916       ASSESSMENT AND PLAN:    59 year old woman with:    1. Anaplastic large T cell lymphoma diagnosed in 2014 and received therapy outlined above.  She developed relapsed disease in December 2020.      She has tolerated Adcetris with excellent clinical response at this time and without any major complications.  Risks and benefits of continuing this therapy at this time were reviewed.  Potential complications that include GI toxicity as well as neuropathy as  well as myelosuppression.  Plan is to repeat imaging studies in the near future to update her status currently.  The plan is to get her to close to remission as possible before consideration of additional therapy to consolidate her remission.  This will be in the form of stem cell transplant or CAR-T therapy. She is agreeable with this plan and will plan to repeat PET scan before the next cycle of therapy.  2.  IV access: Port-A-Cath currently in use for infusion without any reported issues.  3.  Weight loss: Related to lymphoma and has resolved at this time.  4.  Anemia: Does not require any intervention at this time. Her hemoglobin continues to be above 10 and this anticipated to be related to her lymphoma and lymphoma treatment.   5.  Follow-up: In 3 weeks for the next cycle of therapy.  30  minutes was spent on this encounter. The time was dedicated to reviewing her disease status, addressing future plan of care as well as addressing complications related to therapy.    Zola Button MD 02/02/20

## 2020-02-02 NOTE — Progress Notes (Signed)
Pt wt = 100 kg today.  Dr. Alen Blew: dose Brentuximab at 1.8 mg/kg--> 180 mg.  Glucose elevated today ( = 331).  Pt has taken Metformin this AM & uses Lantus at night per inf RN. No Insulin during tx today per Dr. Alen Blew.  Kennith Center, Pharm.D., CPP 02/02/2020@12 :48 PM

## 2020-02-03 ENCOUNTER — Telehealth: Payer: Self-pay | Admitting: Oncology

## 2020-02-03 NOTE — Telephone Encounter (Signed)
Scheduled appt per 2/18 los.  Central radiology will contact patient about PET scan.

## 2020-02-16 ENCOUNTER — Encounter (HOSPITAL_COMMUNITY): Payer: Self-pay

## 2020-02-16 ENCOUNTER — Other Ambulatory Visit: Payer: Self-pay

## 2020-02-16 ENCOUNTER — Ambulatory Visit (HOSPITAL_COMMUNITY)
Admission: RE | Admit: 2020-02-16 | Discharge: 2020-02-16 | Disposition: A | Discharge status: Home / Self Care | Payer: BC Managed Care – PPO | Source: Ambulatory Visit | Attending: Oncology | Admitting: Oncology

## 2020-02-16 DIAGNOSIS — C8460 Anaplastic large cell lymphoma, ALK-positive, unspecified site: Secondary | ICD-10-CM

## 2020-02-16 DIAGNOSIS — C8100 Nodular lymphocyte predominant Hodgkin lymphoma, unspecified site: Secondary | ICD-10-CM | POA: Insufficient documentation

## 2020-02-16 DIAGNOSIS — Z79899 Other long term (current) drug therapy: Secondary | ICD-10-CM | POA: Diagnosis not present

## 2020-02-16 LAB — GLUCOSE, CAPILLARY
Glucose-Capillary: 262 mg/dL — ABNORMAL HIGH (ref 70–99)
Glucose-Capillary: 265 mg/dL — ABNORMAL HIGH (ref 70–99)
Glucose-Capillary: 272 mg/dL — ABNORMAL HIGH (ref 70–99)

## 2020-02-20 ENCOUNTER — Other Ambulatory Visit: Payer: Self-pay

## 2020-02-20 ENCOUNTER — Encounter: Payer: BC Managed Care – PPO | Attending: Internal Medicine | Admitting: Nutrition

## 2020-02-20 ENCOUNTER — Encounter: Payer: Self-pay | Admitting: "Endocrinology

## 2020-02-20 ENCOUNTER — Ambulatory Visit (INDEPENDENT_AMBULATORY_CARE_PROVIDER_SITE_OTHER): Payer: BC Managed Care – PPO | Admitting: "Endocrinology

## 2020-02-20 VITALS — Ht 68.0 in | Wt 218.0 lb

## 2020-02-20 VITALS — BP 119/75 | HR 76 | Ht 68.0 in | Wt 218.0 lb

## 2020-02-20 DIAGNOSIS — C8100 Nodular lymphocyte predominant Hodgkin lymphoma, unspecified site: Secondary | ICD-10-CM | POA: Diagnosis not present

## 2020-02-20 DIAGNOSIS — E782 Mixed hyperlipidemia: Secondary | ICD-10-CM | POA: Diagnosis not present

## 2020-02-20 DIAGNOSIS — I10 Essential (primary) hypertension: Secondary | ICD-10-CM | POA: Insufficient documentation

## 2020-02-20 DIAGNOSIS — E1165 Type 2 diabetes mellitus with hyperglycemia: Secondary | ICD-10-CM

## 2020-02-20 DIAGNOSIS — E669 Obesity, unspecified: Secondary | ICD-10-CM

## 2020-02-20 DIAGNOSIS — N289 Disorder of kidney and ureter, unspecified: Secondary | ICD-10-CM | POA: Diagnosis not present

## 2020-02-20 LAB — POCT GLYCOSYLATED HEMOGLOBIN (HGB A1C): Hemoglobin A1C: 9.9 % — AB (ref 4.0–5.6)

## 2020-02-20 MED ORDER — BD PEN NEEDLE SHORT U/F 31G X 8 MM MISC: 1.0000 | 3 refills | Status: DC

## 2020-02-20 MED ORDER — ACCU-CHEK GUIDE VI STRP: ORAL_STRIP | 2 refills | Status: DC

## 2020-02-20 MED ORDER — LANTUS SOLOSTAR 100 UNIT/ML ~~LOC~~ SOPN: 80.0000 [IU] | PEN_INJECTOR | Freq: Every day | SUBCUTANEOUS | 2 refills | Status: DC

## 2020-02-20 MED ORDER — ACCU-CHEK GUIDE W/DEVICE KIT: 1.0000 | PACK | 0 refills | Status: DC

## 2020-02-20 NOTE — Patient Instructions (Signed)

## 2020-02-20 NOTE — Progress Notes (Signed)
Walk in From Dr. Dorris Fetch. A1C 9.9%. Getting chemo for nonhodgekins lympoma x 4 months. Lantus 80 units once a day- was on 60 units before. 500 mg Metformin BID.  Had BS under control before restarting CHEMO in December 2020.  Educated on My Plate, Meal planning, portion sizes, and signs/symptoms and treatment of hyer/hypoglycemia.   Goals Eat three meals per day Avoid snacks between meals Eat meals on time Drink only water. Don't eat past 7 pm.  Get A1C down to 7% Increase fresh fruits and vegetables.   Will follow up in 1 month.

## 2020-02-20 NOTE — Progress Notes (Signed)
Endocrinology Consult Note       02/20/2020, 4:52 PM   Subjective:    Patient ID: Megan Mcknight, female    DOB: June 13, 1961.  Megan Mcknight is being seen in consultation for management of currently uncontrolled symptomatic diabetes requested by  Rosita Fire, MD.   Past Medical History:  Diagnosis Date  . Acute bronchitis   . Anaplastic large cell lymphoma 2014   remission since 2015  . Asthma   . Cancer ([place])    lung and kidney  . Colon polyps 02/2013   Per colonoscopy  . Coma ([place])   . Diabetes mellitus without complication ([place])   . Diverticulosis 02/2013   Per colonoscopy  . Dysrhythmia   . Former light tobacco smoker   . GERD (gastroesophageal reflux disease)   . H/O stem cell transplant ([place]) 2015  . Hyperlipidemia   . Nerve pain    Left leg  . Non Hodgkin's lymphoma ([place])   . Seasonal allergies     Past Surgical History:  Procedure Laterality Date  . bones removed from Baby toes    . COLONOSCOPY  02/16/2012   Procedure: COLONOSCOPY;  Surgeon: Daneil Dolin, MD;  [facility];  Service: Endoscopy;  Laterality: N/A;  1:30  . COLONOSCOPY N/A 03/26/2017   Procedure: COLONOSCOPY;  Surgeon: Daneil Dolin, MD;  [facility];  Service: Endoscopy;  Laterality: N/A;  930  . IR IMAGING GUIDED PORT INSERTION  12/19/2019  . MEDIASTINOSCOPY N/A 05/13/2013   Procedure: MEDIASTINOSCOPY;  Surgeon: Melrose Nakayama, MD;  Location: [facility];  Service: Thoracic;  Laterality: N/A;  . PARTIAL HYSTERECTOMY    . VAGINAL HYSTERECTOMY     partial hyst   . VIDEO BRONCHOSCOPY WITH ENDOBRONCHIAL ULTRASOUND N/A 05/13/2013   Procedure: VIDEO BRONCHOSCOPY WITH ENDOBRONCHIAL ULTRASOUND;  Surgeon: Melrose Nakayama, MD;  Location: [facility];  Service: Thoracic;  Laterality: N/A;    Social History   Socioeconomic History  . Marital status: Married    Spouse name: Not on file  . Number of  children: 3  . Years of education: Not on file  . Highest education level: Not on file  Occupational History  . Occupation: Lobbyist: Physicist, medical  Tobacco Use  . Smoking status: Former Smoker    Packs/day: 0.50    Types: Cigarettes    Quit date: 04/19/2013    Years since quitting: 6.8  . Smokeless tobacco: Never Used  Substance and Sexual Activity  . Alcohol use: No    Comment: one glass every three weeks  . Drug use: No  . Sexual activity: Yes    Birth control/protection: Surgical  Other Topics Concern  . Not on file  Social History Narrative  . Not on file   Social Determinants of Health   Financial Resource Strain:   . Difficulty of Paying Living Expenses: Not on file  Food Insecurity:   . Worried About Charity fundraiser in the Last Year: Not on file  . Ran Out of Food in the Last Year: Not on file  Transportation Needs:   .  Lack of Transportation (Medical): Not on file  . Lack of Transportation (Non-Medical): Not on file  Physical Activity:   . Days of Exercise per Week: Not on file  . Minutes of Exercise per Session: Not on file  Stress:   . Feeling of Stress : Not on file  Social Connections:   . Frequency of Communication with Friends and Family: Not on file  . Frequency of Social Gatherings with Friends and Family: Not on file  . Attends Religious Services: Not on file  . Active Member of Clubs or Organizations: Not on file  . Attends Archivist Meetings: Not on file  . Marital Status: Not on file    Family History  Problem Relation Age of Onset  . Colon polyps Mother   . Hypertension Mother   . Heart disease Mother   . Hyperlipidemia Mother   . Cancer Father        prostate  . Hypertension Father   . Hyperlipidemia Father   . Heart defect Other        family history   . Asthma Other        family history     Outpatient Encounter Medications as of 02/20/2020  Medication Sig  . albuterol (PROVENTIL HFA;VENTOLIN HFA)  108 (90 Base) MCG/ACT inhaler Inhale 2 puffs into the lungs every 4 (four) hours as needed for wheezing or shortness of breath.  Marland Kitchen amLODipine (NORVASC) 2.5 MG tablet tablet  . Blood Glucose Monitoring Suppl (ACCU-CHEK GUIDE) w/Device KIT 1 Piece by Does not apply route as directed.  Marland Kitchen glucose blood (ACCU-CHEK GUIDE) test strip Use as instructed  . Insulin Pen Needle (B-D ULTRAFINE III SHORT PEN) 31G X 8 MM MISC 1 each by Does not apply route as directed.  Marland Kitchen LANTUS SOLOSTAR 100 UNIT/ML Solostar Pen Inject 80 Units into the skin at bedtime.  Marland Kitchen latanoprost (XALATAN) 0.005 % ophthalmic solution 1 drop at bedtime.  . lidocaine-prilocaine (EMLA) cream Apply 1 application topically as needed.  Marland Kitchen losartan (COZAAR) 25 MG tablet tablet  . metFORMIN (GLUCOPHAGE) 500 MG tablet Take by mouth 2 (two) times daily with a meal.  . metoprolol tartrate (LOPRESSOR) 25 MG tablet Take 25 mg by mouth 2 (two) times daily.   . metroNIDAZOLE (METROGEL) 0.75 % vaginal gel Place 1 Applicatorful vaginally at bedtime. Apply one applicatorful to vagina at bedtime for 5 days  . montelukast (SINGULAIR) 10 MG tablet Take 10 mg by mouth at bedtime.  . prochlorperazine (COMPAZINE) 10 MG tablet Take 1 tablet (10 mg total) by mouth every 6 (six) hours as needed for nausea or vomiting.  . rosuvastatin (CRESTOR) 10 MG tablet Take 10 mg by mouth daily.  . [DISCONTINUED] amLODipine (NORVASC) 5 MG tablet Take 5 mg by mouth daily.  . [DISCONTINUED] amoxicillin-clavulanate (AUGMENTIN) 875-125 MG tablet Take 1 tablet by mouth 2 (two) times daily.  . [DISCONTINUED] LANTUS SOLOSTAR 100 UNIT/ML Solostar Pen 60 Units.   . [DISCONTINUED] predniSONE (DELTASONE) 20 MG tablet Take 3 tablets (60 mg total) by mouth daily with breakfast.   No facility-administered encounter medications on file as of 02/20/2020.    ALLERGIES: Allergies  Allergen Reactions  . Iodine Anaphylaxis    Told to avoid due to shellfish allergy. Told to avoid due to  shellfish allergy.  Marland Kitchen Shellfish-Derived Products Anaphylaxis and Swelling    Eyes swelling, scratchy throat, bumps on lips.    VACCINATION STATUS: Immunization History  Administered Date(s) Administered  . DTaP / Hep B /  IPV 10/04/2014  . HiB (PRP-T) 10/04/2014  . Influenza,inj,Quad PF,6+ Mos 11/04/2013, 10/04/2014  . Influenza-Unspecified 09/14/2018  . Pneumococcal Conjugate-13 10/04/2014    Diabetes She presents for her initial diabetic visit. She has type 2 diabetes mellitus. Onset time: She was diagnosed at approximate age of 74 years. Her disease course has been worsening. There are no hypoglycemic associated symptoms. Pertinent negatives for hypoglycemia include no confusion, headaches, pallor or seizures. Associated symptoms include fatigue, polydipsia and polyuria. Pertinent negatives for diabetes include no chest pain and no polyphagia. There are no hypoglycemic complications. Symptoms are worsening. There are no diabetic complications. Risk factors for coronary artery disease include diabetes mellitus, dyslipidemia, family history, obesity, hypertension, tobacco exposure, sedentary lifestyle and post-menopausal. Current diabetic treatment includes insulin injections (She is currently on Lantus 60 units nightly, Metformin 1000 mg p.o. twice daily.). Her weight is increasing steadily. She is following a generally unhealthy diet. When asked about meal planning, she reported none. She has not had a previous visit with a dietitian. She rarely participates in exercise. (She did not bring any logs nor meter to review.  Her point-of-care A1c is 9.9%.  She did have A1c as high as 14% recently. ) An ACE inhibitor/angiotensin II receptor blocker is being taken. Eye exam is current.  Hyperlipidemia This is a chronic problem. The current episode started more than 1 year ago. Exacerbating diseases include diabetes. Factors aggravating her hyperlipidemia include smoking. Pertinent negatives include no  chest pain, myalgias or shortness of breath. Current antihyperlipidemic treatment includes statins. Risk factors for coronary artery disease include dyslipidemia, diabetes mellitus, hypertension, obesity, a sedentary lifestyle, post-menopausal and family history.  Hypertension This is a chronic problem. The current episode started more than 1 year ago. Pertinent negatives include no chest pain, headaches, palpitations or shortness of breath. Risk factors for coronary artery disease include diabetes mellitus, dyslipidemia, family history, obesity, post-menopausal state, smoking/tobacco exposure and sedentary lifestyle. Past treatments include angiotensin blockers.    Review of Systems  Constitutional: Positive for fatigue. Negative for chills, fever and unexpected weight change.  HENT: Negative for trouble swallowing and voice change.   Eyes: Negative for visual disturbance.  Respiratory: Negative for cough, shortness of breath and wheezing.   Cardiovascular: Negative for chest pain, palpitations and leg swelling.  Gastrointestinal: Negative for diarrhea, nausea and vomiting.  Endocrine: Positive for polydipsia and polyuria. Negative for cold intolerance, heat intolerance and polyphagia.  Musculoskeletal: Negative for arthralgias and myalgias.  Skin: Negative for color change, pallor, rash and wound.  Neurological: Negative for seizures and headaches.  Psychiatric/Behavioral: Negative for confusion and suicidal ideas.    Objective:    Vitals with BMI 02/20/2020 02/20/2020 02/02/2020  Height 5' 8"  5' 8"  5' 8"   Weight 218 lbs 218 lbs 222 lbs 2 oz  BMI 33.15 97.47 18.55  Systolic - 015 868  Diastolic - 75 80  Pulse - 76 78    BP 119/75   Pulse 76   Ht 5' 8"  (1.727 m)   Wt 218 lb (98.9 kg)   BMI 33.15 kg/m   Wt Readings from Last 3 Encounters:  02/20/20 218 lb (98.9 kg)  02/20/20 218 lb (98.9 kg)  02/02/20 222 lb 1.6 oz (100.7 kg)     Physical Exam Constitutional:      Appearance:  She is well-developed.  HENT:     Head: Normocephalic and atraumatic.  Neck:     Thyroid: No thyromegaly.     Trachea: No tracheal deviation.  Cardiovascular:  Rate and Rhythm: Normal rate and regular rhythm.  Pulmonary:     Effort: Pulmonary effort is normal.  Abdominal:     Palpations: Abdomen is soft.     Tenderness: There is no abdominal tenderness. There is no guarding.  Musculoskeletal:        General: Normal range of motion.     Cervical back: Normal range of motion and neck supple.  Skin:    General: Skin is warm and dry.     Coloration: Skin is not pale.     Findings: No erythema or rash.  Neurological:     Mental Status: She is alert and oriented to person, place, and time.     Cranial Nerves: No cranial nerve deficit.     Coordination: Coordination normal.     Deep Tendon Reflexes: Reflexes are normal and symmetric.  Psychiatric:        Judgment: Judgment normal.       CMP ( most recent) CMP     Component Value Date/Time   NA 141 02/02/2020 1130   NA 144 09/04/2017 0916   K 3.8 02/02/2020 1130   K 4.1 09/04/2017 0916   CL 110 02/02/2020 1130   CO2 24 02/02/2020 1130   CO2 26 09/04/2017 0916   GLUCOSE 331 (H) 02/02/2020 1130   GLUCOSE 197 (H) 09/04/2017 0916   BUN 11 02/02/2020 1130   BUN 15.6 09/04/2017 0916   CREATININE 0.89 02/02/2020 1130   CREATININE 1.0 09/04/2017 0916   CALCIUM 9.0 02/02/2020 1130   CALCIUM 9.5 09/04/2017 0916   PROT 6.6 02/02/2020 1130   PROT 6.8 09/04/2017 0916   ALBUMIN 3.6 02/02/2020 1130   ALBUMIN 3.8 09/04/2017 0916   AST 14 (L) 02/02/2020 1130   AST 19 09/04/2017 0916   ALT 17 02/02/2020 1130   ALT 18 09/04/2017 0916   ALKPHOS 56 02/02/2020 1130   ALKPHOS 49 09/04/2017 0916   BILITOT 0.4 02/02/2020 1130   BILITOT 0.49 09/04/2017 0916   GFRNONAA >60 02/02/2020 1130   GFRAA >60 02/02/2020 1130     Diabetic Labs (most recent): Lab Results  Component Value Date   HGBA1C 9.9 (A) 02/20/2020   HGBA1C 12.4 (H)  08/23/2013   HGBA1C 12.4 (H) 08/22/2013    Lab Results  Component Value Date   TSH 2.859 05/06/2013   FREET4 0.93 05/06/2013     Assessment & Plan:   1. Type 2 diabetes mellitus with hyperglycemia, without long-term current use of insulin (HCC)  - Megan Mcknight has currently uncontrolled symptomatic type 2 DM since  59 years of age,  with most recent A1c of 9.9 %. Recent labs reviewed.  Her diabetes is recently exacerbated by concurrent use of steroids during treatment with chemo for lymphoma. - I had a long discussion with her about the progressive nature of diabetes and the pathology behind its complications. -her diabetes is complicated by obesity/sedentary life and she remains at a high risk for more acute and chronic complications which include CAD, CVA, CKD, retinopathy, and neuropathy. These are all discussed in detail with her.  - I have counseled her on diet  and weight management  by adopting a carbohydrate restricted/protein rich diet. Patient is encouraged to switch to  unprocessed or minimally processed     complex starch and increased protein intake (animal or plant source), fruits, and vegetables. -  she is advised to stick to a routine mealtimes to eat 3 meals  a day and avoid unnecessary snacks (  to snack only to correct hypoglycemia).   - she admits that there is a room for improvement in her food and drink choices. - Suggestion is made for her to avoid simple carbohydrates  from her diet including Cakes, Sweet Desserts, Ice Cream, Soda (diet and regular), Sweet Tea, Candies, Chips, Cookies, Store Bought Juices, Alcohol in Excess of  1-2 drinks a day, Artificial Sweeteners,  Coffee Creamer, and "Sugar-free" Products. This will help patient to have more stable blood glucose profile and potentially avoid unintended weight gain.  - she will be scheduled with Jearld Fenton, RDN, CDE for diabetes education.  - I have approached her with the following individualized plan to  manage  her diabetes and patient agrees:   -Based on her presentation with chronically uncontrolled diabetes, she may need intensive treatment with basal/bolus insulin in order for her to achieve control of diabetes with target. In preparation, she is advised to increase her Lantus to 80 units nightly, start strict monitoring of glucose 4 times a day-before meals and at bedtime. - she is warned not to take insulin without proper monitoring per orders.  - she is encouraged to call clinic for blood glucose levels less than 70 or above 300 mg /dl. - she is advised to continue continue Metformin 1000 mg p.o. twice daily, therapeutically suitable for patient .  - she will be considered for incretin therapy as appropriate next visit.  - Specific targets for  A1c;  LDL, HDL,  and Triglycerides were discussed with the patient.  2) Blood Pressure /Hypertension:  her blood pressure is  controlled to target.   she is advised to continue her current medications including losartan 25 mg p.o. daily with breakfast . 3) Lipids/Hyperlipidemia: She does not have recent lipid panel to review.  She is advised to continue Crestor10 mg daily at bedtime.  Side effects and precautions discussed with her.  4)  Weight/Diet:  Body mass index is 33.15 kg/m.  -   clearly complicating her diabetes care.   she is  a candidate for weight loss. I discussed with her the fact that loss of 5 - 10% of her  current body weight will have the most impact on her diabetes management.  Exercise, and detailed carbohydrates information provided  -  detailed on discharge instructions.  5) Chronic Care/Health Maintenance:  -she  is on ACEI/ARB and Statin medications and  is encouraged to initiate and continue to follow up with Ophthalmology, Dentist,  Podiatrist at least yearly or according to recommendations, and advised to   stay away from smoking. I have recommended yearly flu vaccine and pneumonia vaccine at least every 5 years; moderate  intensity exercise for up to 150 minutes weekly; and  sleep for at least 7 hours a day.  - she is  advised to maintain close follow up with Rosita Fire, MD for primary care needs, as well as her other providers for optimal and coordinated care.   - Time spent in this patient care: 60 min, of which > 50% was spent in  counseling  her about her chronically uncontrolled type 2 diabetes, hyperlipidemia, hypertension and the rest reviewing her blood glucose logs , discussing her hypoglycemia and hyperglycemia episodes, reviewing her current and  previous labs / studies  ( including abstraction from other facilities) and medications  doses and developing a  long term treatment plan based on the latest standards of care/ guidelines; and documenting her care.    Please refer to Patient Instructions for  Blood Glucose Monitoring and Insulin/Medications Dosing Guide"  in media tab for additional information. Please  also refer to " Patient Self Inventory" in the Media  tab for reviewed elements of pertinent patient history.  Megan Mcknight participated in the discussions, expressed understanding, and voiced agreement with the above plans.  All questions were answered to her satisfaction. she is encouraged to contact clinic should she have any questions or concerns prior to her return visit.   Follow up plan: - Return in about 10 days (around 03/01/2020), or office, for Follow up with Meter and Logs Only - no Labs.  Glade Lloyd, MD [facility] [address] [place], [place] [address] Phone: [phone]  Fax: [phone]    02/20/2020, 4:52 PM  This note was partially dictated with voice recognition software. Similar sounding words can be transcribed inadequately or may not  be corrected upon review.

## 2020-02-21 ENCOUNTER — Other Ambulatory Visit: Payer: Self-pay

## 2020-02-21 MED ORDER — BLOOD GLUCOSE MONITOR KIT: 1.0000 | PACK | Freq: Four times a day (QID) | 0 refills | Status: DC

## 2020-02-23 ENCOUNTER — Inpatient Hospital Stay: Payer: BC Managed Care – PPO | Attending: Oncology

## 2020-02-23 ENCOUNTER — Inpatient Hospital Stay: Payer: BC Managed Care – PPO

## 2020-02-23 ENCOUNTER — Encounter: Payer: Self-pay | Admitting: Oncology

## 2020-02-23 ENCOUNTER — Other Ambulatory Visit: Payer: Self-pay

## 2020-02-23 ENCOUNTER — Inpatient Hospital Stay (HOSPITAL_BASED_OUTPATIENT_CLINIC_OR_DEPARTMENT_OTHER): Payer: BC Managed Care – PPO | Admitting: Oncology

## 2020-02-23 VITALS — BP 111/83 | HR 71 | Temp 97.6°F | Resp 18 | Ht 68.0 in | Wt 218.4 lb

## 2020-02-23 DIAGNOSIS — Z5112 Encounter for antineoplastic immunotherapy: Secondary | ICD-10-CM | POA: Diagnosis not present

## 2020-02-23 DIAGNOSIS — R634 Abnormal weight loss: Secondary | ICD-10-CM | POA: Insufficient documentation

## 2020-02-23 DIAGNOSIS — D649 Anemia, unspecified: Secondary | ICD-10-CM | POA: Diagnosis not present

## 2020-02-23 DIAGNOSIS — Z95828 Presence of other vascular implants and grafts: Secondary | ICD-10-CM

## 2020-02-23 DIAGNOSIS — C8460 Anaplastic large cell lymphoma, ALK-positive, unspecified site: Secondary | ICD-10-CM | POA: Diagnosis not present

## 2020-02-23 DIAGNOSIS — C8100 Nodular lymphocyte predominant Hodgkin lymphoma, unspecified site: Secondary | ICD-10-CM

## 2020-02-23 LAB — CMP (CANCER CENTER ONLY)
ALT: 22 U/L (ref 0–44)
AST: 21 U/L (ref 15–41)
Albumin: 3.7 g/dL (ref 3.5–5.0)
Alkaline Phosphatase: 59 U/L (ref 38–126)
Anion gap: 7 (ref 5–15)
BUN: 18 mg/dL (ref 6–20)
CO2: 25 mmol/L (ref 22–32)
Calcium: 9.2 mg/dL (ref 8.9–10.3)
Chloride: 110 mmol/L (ref 98–111)
Creatinine: 0.88 mg/dL (ref 0.44–1.00)
GFR, Est AFR Am: >60 mL/min (ref >60)
GFR, Estimated: >60 mL/min (ref >60)
Glucose, Bld: 210 mg/dL — ABNORMAL HIGH (ref 70–99)
Potassium: 3.6 mmol/L (ref 3.5–5.1)
Sodium: 142 mmol/L (ref 135–145)
Total Bilirubin: 0.4 mg/dL (ref 0.3–1.2)
Total Protein: 6.7 g/dL (ref 6.5–8.1)

## 2020-02-23 LAB — CBC WITH DIFFERENTIAL (CANCER CENTER ONLY)
Abs Immature Granulocytes: 0.01 10*3/uL (ref 0.00–0.07)
Basophils Absolute: 0 10*3/uL (ref 0.0–0.1)
Basophils Relative: 1 %
Eosinophils Absolute: 0 10*3/uL (ref 0.0–0.5)
Eosinophils Relative: 1 %
HCT: 36.6 % (ref 36.0–46.0)
Hemoglobin: 11.5 g/dL — ABNORMAL LOW (ref 12.0–15.0)
Immature Granulocytes: 0 %
Lymphocytes Relative: 31 %
Lymphs Abs: 1.5 10*3/uL (ref 0.7–4.0)
MCH: 27.6 pg (ref 26.0–34.0)
MCHC: 31.4 g/dL (ref 30.0–36.0)
MCV: 88 fL (ref 80.0–100.0)
Monocytes Absolute: 0.4 10*3/uL (ref 0.1–1.0)
Monocytes Relative: 9 %
Neutro Abs: 2.9 10*3/uL (ref 1.7–7.7)
Neutrophils Relative %: 58 %
Platelet Count: 189 10*3/uL (ref 150–400)
RBC: 4.16 MIL/uL (ref 3.87–5.11)
RDW: 17.2 % — ABNORMAL HIGH (ref 11.5–15.5)
WBC Count: 4.9 10*3/uL (ref 4.0–10.5)
nRBC: 0 % (ref 0.0–0.2)

## 2020-02-23 LAB — SAMPLE TO BLOOD BANK

## 2020-02-23 MED ORDER — SODIUM CHLORIDE 0.9% FLUSH
10.0000 mL | INTRAVENOUS | Status: DC | PRN
  Administered 2020-02-23: 10 mL
  Filled 2020-02-23: qty 10

## 2020-02-23 MED ORDER — HEPARIN SOD (PORK) LOCK FLUSH 100 UNIT/ML IV SOLN
500.0000 [IU] | Freq: Once | INTRAVENOUS | Status: AC | PRN
  Administered 2020-02-23: 500 [IU]
  Filled 2020-02-23: qty 5

## 2020-02-23 MED ORDER — DIPHENHYDRAMINE HCL 50 MG/ML IJ SOLN
50.0000 mg | Freq: Once | INTRAMUSCULAR | Status: AC
  Administered 2020-02-23: 50 mg via INTRAVENOUS

## 2020-02-23 MED ORDER — ACETAMINOPHEN 325 MG PO TABS
ORAL_TABLET | ORAL | Status: AC
  Filled 2020-02-23: qty 2

## 2020-02-23 MED ORDER — DEXAMETHASONE SODIUM PHOSPHATE 10 MG/ML IJ SOLN
10.0000 mg | Freq: Once | INTRAMUSCULAR | Status: AC
  Administered 2020-02-23: 10 mg via INTRAVENOUS

## 2020-02-23 MED ORDER — DIPHENHYDRAMINE HCL 50 MG/ML IJ SOLN
INTRAMUSCULAR | Status: AC
  Filled 2020-02-23: qty 1

## 2020-02-23 MED ORDER — SODIUM CHLORIDE 0.9 % IV SOLN
Freq: Once | INTRAVENOUS | Status: AC
  Filled 2020-02-23: qty 250

## 2020-02-23 MED ORDER — ACETAMINOPHEN 325 MG PO TABS
650.0000 mg | ORAL_TABLET | Freq: Once | ORAL | Status: AC
  Administered 2020-02-23: 650 mg via ORAL

## 2020-02-23 MED ORDER — SODIUM CHLORIDE 0.9 % IV SOLN
1.8000 mg/kg | Freq: Once | INTRAVENOUS | Status: AC
  Administered 2020-02-23: 180 mg via INTRAVENOUS
  Filled 2020-02-23: qty 36

## 2020-02-23 MED ORDER — SODIUM CHLORIDE 0.9% FLUSH
10.0000 mL | Freq: Once | INTRAVENOUS | Status: AC
  Administered 2020-02-23: 10 mL
  Filled 2020-02-23: qty 10

## 2020-02-23 MED ORDER — DEXAMETHASONE SODIUM PHOSPHATE 10 MG/ML IJ SOLN
INTRAMUSCULAR | Status: AC
  Filled 2020-02-23: qty 1

## 2020-02-23 NOTE — Patient Instructions (Signed)
[facility] Discharge Instructions for Patients Receiving Chemotherapy  Today you received the following chemotherapy agents Adcetris.  To help prevent nausea and vomiting after your treatment, we encourage you to take your nausea medication as directed.    If you develop nausea and vomiting that is not controlled by your nausea medication, call the clinic.   BELOW ARE SYMPTOMS THAT SHOULD BE REPORTED IMMEDIATELY:  *FEVER GREATER THAN 100.5 F  *CHILLS WITH OR WITHOUT FEVER  NAUSEA AND VOMITING THAT IS NOT CONTROLLED WITH YOUR NAUSEA MEDICATION  *UNUSUAL SHORTNESS OF BREATH  *UNUSUAL BRUISING OR BLEEDING  TENDERNESS IN MOUTH AND THROAT WITH OR WITHOUT PRESENCE OF ULCERS  *URINARY PROBLEMS  *BOWEL PROBLEMS  UNUSUAL RASH Items with * indicate a potential emergency and should be followed up as soon as possible.  Feel free to call the clinic you have any questions or concerns. The clinic phone number is [phone].  Please show the [facility] at check-in to the Emergency Department and triage nurse.

## 2020-02-23 NOTE — Progress Notes (Unsigned)
Met with patient at

## 2020-02-23 NOTE — Progress Notes (Signed)
[facility] OFFICE PROGRESS NOTE 02/23/20   Rosita Fire, MD [address] [place] [address]  DIAGNOSIS: 59 year old woman with CD30 positive T-cell lymphoma diagnosed in 2014 and relapsed in 2020.  She was found to have anaplastic large T-cell type with CD20 negative disease.     PRIOR THERAPY:  CHOEP q 21 days started on 05/20/2013.  Completed 6 of 6 planned cycles for CHOEP on 09/05/2013.  Cycle #5 was complicated by ICU-level care secondary to severe hyperglycemia (see prior note for details).  Cycle #6, prednisone was excluded.  She received neulasta after each cycle of therapy.   She underwent autologous SCT on February 4th 2015 at [facility] ([facility]  She has not received any additional therapy after that after achieving remission.  CURRENT THERAPY: Adcetris 1.8 mg/kg every 3 weeks started with cycle 1 on 12/01/2019.  He is here for cycle 5.  INTERVAL HISTORY: Ms. Ericsson returns today for a repeat evaluation.  Since the last visit, she continues to tolerate therapy without any major complaints.  Denies any recent nausea, fatigue or abdominal pain.  She denies any excessive fatigue or tiredness.  She did report some weight loss some of it intentional.  She denies any peripheral neuropathy or infusion related complications.              ALLERGIES:  is allergic to iodine and shellfish-derived products.  MEDICATIONS: Reviewed today without any changes.   PHYSICAL EXAMINATION:     Blood pressure 111/83, pulse 71, temperature 97.6 F (36.4 C), temperature source Temporal, resp. rate 18, height 5\' 8"  (1.727 m), weight 218 lb 6.4 oz (99.1 kg), SpO2 100 %.      ECOG 1   General appearance: Alert, awake without any distress. Head: Atraumatic without abnormalities Oropharynx: Without any thrush or ulcers. Eyes: No scleral icterus. Lymph nodes: No lymphadenopathy noted in the cervical, supraclavicular, or axillary  nodes Heart:regular rate and rhythm, without any murmurs or gallops.   Lung: Clear to auscultation without any rhonchi, wheezes or dullness to percussion. Abdomin: Soft, nontender without any shifting dullness or ascites. Musculoskeletal: No clubbing or cyanosis. Neurological: No motor or sensory deficits. Skin: No rashes or lesions.             Labs:  Lab Results  Component Value Date   WBC 5.3 02/02/2020   HGB 10.4 (L) 02/02/2020   HCT 33.0 (L) 02/02/2020   MCV 87.5 02/02/2020   PLT 203 02/02/2020   NEUTROABS 2.9 02/02/2020      Chemistry      Component Value Date/Time   NA 141 02/02/2020 1130   NA 144 09/04/2017 0916   K 3.8 02/02/2020 1130   K 4.1 09/04/2017 0916   CL 110 02/02/2020 1130   CO2 24 02/02/2020 1130   CO2 26 09/04/2017 0916   BUN 11 02/02/2020 1130   BUN 15.6 09/04/2017 0916   CREATININE 0.89 02/02/2020 1130   CREATININE 1.0 09/04/2017 0916      Component Value Date/Time   CALCIUM 9.0 02/02/2020 1130   CALCIUM 9.5 09/04/2017 0916   ALKPHOS 56 02/02/2020 1130   ALKPHOS 49 09/04/2017 0916   AST 14 (L) 02/02/2020 1130   AST 19 09/04/2017 0916   ALT 17 02/02/2020 1130   ALT 18 09/04/2017 0916   BILITOT 0.4 02/02/2020 1130   BILITOT 0.49 09/04/2017 0916       ASSESSMENT AND PLAN:    59 year old woman with:    1.  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     He remains on salvage therapy utilizing Adcetris with excellent clinical benefit.  He is no longer reporting any lymphadenopathy or constitutional symptoms.  Performance status and activity level is back to normal.  He is scheduled to have a repeat imaging studies next week planning a PET scan which has been delayed because of hyperglycemia.  Risks and benefits of continuing this therapy was discussed today.  Different salvage options including stem cell transplant and CAR-T options were also reviewed.  The plan is to continue  with this therapy to achieve complete response and we will proceed with different consolidative option after that.  2.  IV access: Port-A-Cath remains in use without any issues.  3.  Weight loss: She did lose few pounds since last visit but overall feels well with adequate nutrition.  4.  Anemia: Related to her lymphoma with hemoglobin normalizing at this time which indicate treatment response.   5.  Follow-up: In 3 weeks for a repeat evaluation.  30  minutes were dedicated to this visit.  Time was spent on reviewing laboratory data, disease status update and addressing complications related to her cancer and cancer therapy.    Zola Button MD 02/23/20

## 2020-02-24 ENCOUNTER — Ambulatory Visit: Payer: PRIVATE HEALTH INSURANCE | Admitting: "Endocrinology

## 2020-02-24 ENCOUNTER — Telehealth: Payer: Self-pay | Admitting: Oncology

## 2020-02-24 NOTE — Telephone Encounter (Signed)
Scheduled appt per 3/11 los.

## 2020-02-28 ENCOUNTER — Encounter (HOSPITAL_COMMUNITY)
Admission: RE | Admit: 2020-02-28 | Discharge: 2020-02-28 | Disposition: A | Discharge status: Home / Self Care | Payer: BC Managed Care – PPO | Source: Ambulatory Visit | Attending: Oncology | Admitting: Oncology

## 2020-02-28 ENCOUNTER — Other Ambulatory Visit: Payer: Self-pay

## 2020-02-28 DIAGNOSIS — Z79899 Other long term (current) drug therapy: Secondary | ICD-10-CM | POA: Insufficient documentation

## 2020-02-28 DIAGNOSIS — C8460 Anaplastic large cell lymphoma, ALK-positive, unspecified site: Secondary | ICD-10-CM | POA: Diagnosis not present

## 2020-02-28 DIAGNOSIS — C8100 Nodular lymphocyte predominant Hodgkin lymphoma, unspecified site: Secondary | ICD-10-CM | POA: Diagnosis not present

## 2020-02-28 LAB — GLUCOSE, CAPILLARY: Glucose-Capillary: 84 mg/dL (ref 70–99)

## 2020-02-28 MED ORDER — FLUDEOXYGLUCOSE F - 18 (FDG) INJECTION: 10.8000 | Freq: Once | INTRAVENOUS | Status: DC | PRN

## 2020-03-05 ENCOUNTER — Encounter: Payer: Self-pay | Admitting: Nutrition

## 2020-03-05 ENCOUNTER — Encounter: Payer: BC Managed Care – PPO | Attending: "Endocrinology | Admitting: Nutrition

## 2020-03-05 ENCOUNTER — Ambulatory Visit (INDEPENDENT_AMBULATORY_CARE_PROVIDER_SITE_OTHER): Payer: BC Managed Care – PPO | Admitting: "Endocrinology

## 2020-03-05 ENCOUNTER — Encounter: Payer: Self-pay | Admitting: "Endocrinology

## 2020-03-05 ENCOUNTER — Other Ambulatory Visit: Payer: Self-pay

## 2020-03-05 VITALS — BP 128/82 | HR 67 | Ht 68.0 in | Wt 222.0 lb

## 2020-03-05 VITALS — Ht 68.0 in | Wt 222.0 lb

## 2020-03-05 DIAGNOSIS — C8100 Nodular lymphocyte predominant Hodgkin lymphoma, unspecified site: Secondary | ICD-10-CM | POA: Insufficient documentation

## 2020-03-05 DIAGNOSIS — E669 Obesity, unspecified: Secondary | ICD-10-CM | POA: Insufficient documentation

## 2020-03-05 DIAGNOSIS — E1165 Type 2 diabetes mellitus with hyperglycemia: Secondary | ICD-10-CM | POA: Diagnosis not present

## 2020-03-05 DIAGNOSIS — I10 Essential (primary) hypertension: Secondary | ICD-10-CM | POA: Insufficient documentation

## 2020-03-05 DIAGNOSIS — E782 Mixed hyperlipidemia: Secondary | ICD-10-CM

## 2020-03-05 MED ORDER — LANTUS SOLOSTAR 100 UNIT/ML ~~LOC~~ SOPN: 70.0000 [IU] | PEN_INJECTOR | Freq: Every day | SUBCUTANEOUS | 2 refills | Status: DC

## 2020-03-05 NOTE — Progress Notes (Signed)
03/05/2020, 6:39 PM  Endocrinology follow-up note   Subjective:    Patient ID: Megan Mcknight, female    DOB: 04-07-1961.  Megan Mcknight is being seen in follow-up after she was seen in consultation for management of currently uncontrolled symptomatic diabetes requested by  Rosita Fire, MD.   Past Medical History:  Diagnosis Date  . Acute bronchitis   . Anaplastic large cell lymphoma 2014   remission since 2015  . Asthma   . Cancer ([facility])    lung and kidney  . Colon polyps 02/2013   Per colonoscopy  . Coma ([place])   . Diabetes mellitus without complication ([place])   . Diverticulosis 02/2013   Per colonoscopy  . Dysrhythmia   . Former light tobacco smoker   . GERD (gastroesophageal reflux disease)   . H/O stem cell transplant ([facility]) 2015  . Hyperlipidemia   . Nerve pain    Left leg  . Non Hodgkin's lymphoma ([place])   . Seasonal allergies     Past Surgical History:  Procedure Laterality Date  . bones removed from Baby toes    . COLONOSCOPY  02/16/2012   Procedure: COLONOSCOPY;  Surgeon: Daneil Dolin, MD;  [facility];  Service: Endoscopy;  Laterality: N/A;  1:30  . COLONOSCOPY N/A 03/26/2017   Procedure: COLONOSCOPY;  Surgeon: Daneil Dolin, MD;  [facility];  Service: Endoscopy;  Laterality: N/A;  930  . IR IMAGING GUIDED PORT INSERTION  12/19/2019  . MEDIASTINOSCOPY N/A 05/13/2013   Procedure: MEDIASTINOSCOPY;  Surgeon: Melrose Nakayama, MD;  Location: [facility];  Service: Thoracic;  Laterality: N/A;  . PARTIAL HYSTERECTOMY    . VAGINAL HYSTERECTOMY     partial hyst   . VIDEO BRONCHOSCOPY WITH ENDOBRONCHIAL ULTRASOUND N/A 05/13/2013   Procedure: VIDEO BRONCHOSCOPY WITH ENDOBRONCHIAL ULTRASOUND;  Surgeon: Melrose Nakayama, MD;  Location: [facility];  Service: Thoracic;  Laterality: N/A;    Social History   Socioeconomic History  . Marital status: Married    Spouse name: Not on file  .  Number of children: 3  . Years of education: Not on file  . Highest education level: Not on file  Occupational History  . Occupation: Lobbyist: Physicist, medical  Tobacco Use  . Smoking status: Former Smoker    Packs/day: 0.50    Types: Cigarettes    Quit date: 04/19/2013    Years since quitting: 6.8  . Smokeless tobacco: Never Used  Substance and Sexual Activity  . Alcohol use: No    Comment: one glass every three weeks  . Drug use: No  . Sexual activity: Yes    Birth control/protection: Surgical  Other Topics Concern  . Not on file  Social History Narrative  . Not on file   Social Determinants of Health   Financial Resource Strain:   . Difficulty of Paying Living Expenses:   Food Insecurity:   . Worried About Charity fundraiser in the Last Year:   . Arboriculturist in the Last Year:   Transportation Needs:   . Film/video editor (Medical):   Marland Kitchen Lack of Transportation (Non-Medical):   Physical Activity:   . Days of Exercise per  Week:   . Minutes of Exercise per Session:   Stress:   . Feeling of Stress :   Social Connections:   . Frequency of Communication with Friends and Family:   . Frequency of Social Gatherings with Friends and Family:   . Attends Religious Services:   . Active Member of Clubs or Organizations:   . Attends Archivist Meetings:   Marland Kitchen Marital Status:     Family History  Problem Relation Age of Onset  . Colon polyps Mother   . Hypertension Mother   . Heart disease Mother   . Hyperlipidemia Mother   . Cancer Father        prostate  . Hypertension Father   . Hyperlipidemia Father   . Heart defect Other        family history   . Asthma Other        family history     Outpatient Encounter Medications as of 03/05/2020  Medication Sig  . albuterol (PROVENTIL HFA;VENTOLIN HFA) 108 (90 Base) MCG/ACT inhaler Inhale 2 puffs into the lungs every 4 (four) hours as needed for wheezing or shortness of breath.  Marland Kitchen amLODipine  (NORVASC) 2.5 MG tablet tablet  . blood glucose meter kit and supplies KIT 1 each by Does not apply route 4 (four) times daily. Dispense based on patient and insurance preference. Use up to four times daily as directed. (FOR ICD-9 250.00, 250.01).  . Blood Glucose Monitoring Suppl (ACCU-CHEK GUIDE) w/Device KIT 1 Piece by Does not apply route as directed.  Marland Kitchen glucose blood (ACCU-CHEK GUIDE) test strip Use as instructed  . Insulin Pen Needle (B-D ULTRAFINE III SHORT PEN) 31G X 8 MM MISC 1 each by Does not apply route as directed.  Marland Kitchen LANTUS SOLOSTAR 100 UNIT/ML Solostar Pen Inject 70 Units into the skin at bedtime.  Marland Kitchen latanoprost (XALATAN) 0.005 % ophthalmic solution 1 drop at bedtime.  . lidocaine-prilocaine (EMLA) cream Apply 1 application topically as needed.  Marland Kitchen losartan (COZAAR) 25 MG tablet tablet  . metFORMIN (GLUCOPHAGE) 500 MG tablet Take by mouth 2 (two) times daily with a meal.  . metoprolol tartrate (LOPRESSOR) 25 MG tablet Take 25 mg by mouth 2 (two) times daily.   . metroNIDAZOLE (METROGEL) 0.75 % vaginal gel Place 1 Applicatorful vaginally at bedtime. Apply one applicatorful to vagina at bedtime for 5 days  . montelukast (SINGULAIR) 10 MG tablet Take 10 mg by mouth at bedtime.  . prochlorperazine (COMPAZINE) 10 MG tablet Take 1 tablet (10 mg total) by mouth every 6 (six) hours as needed for nausea or vomiting.  . rosuvastatin (CRESTOR) 10 MG tablet Take 10 mg by mouth daily.  . [DISCONTINUED] LANTUS SOLOSTAR 100 UNIT/ML Solostar Pen Inject 80 Units into the skin at bedtime.   No facility-administered encounter medications on file as of 03/05/2020.    ALLERGIES: Allergies  Allergen Reactions  . Iodine Anaphylaxis    Told to avoid due to shellfish allergy. Told to avoid due to shellfish allergy.  Marland Kitchen Shellfish-Derived Products Anaphylaxis and Swelling    Eyes swelling, scratchy throat, bumps on lips.    VACCINATION STATUS: Immunization History  Administered Date(s) Administered   . DTaP / Hep B / IPV 10/04/2014  . HiB (PRP-T) 10/04/2014  . Influenza,inj,Quad PF,6+ Mos 11/04/2013, 10/04/2014  . Influenza-Unspecified 09/14/2018  . Pneumococcal Conjugate-13 10/04/2014    Diabetes She presents for her follow-up diabetic visit. She has type 2 diabetes mellitus. Onset time: She was diagnosed at approximate age of  50 years. Her disease course has been improving. There are no hypoglycemic associated symptoms. Pertinent negatives for hypoglycemia include no confusion, headaches, pallor or seizures. Associated symptoms include fatigue, polydipsia and polyuria. Pertinent negatives for diabetes include no chest pain and no polyphagia. There are no hypoglycemic complications. Symptoms are improving. There are no diabetic complications. Risk factors for coronary artery disease include diabetes mellitus, dyslipidemia, family history, obesity, hypertension, tobacco exposure, sedentary lifestyle and post-menopausal. Current diabetic treatment includes insulin injections (She is currently on Lantus 60 units nightly, Metformin 1000 mg p.o. twice daily.). Her weight is fluctuating minimally. She is following a generally unhealthy diet. When asked about meal planning, she reported none. She has not had a previous visit with a dietitian. She rarely participates in exercise. Her home blood glucose trend is decreasing steadily. Her breakfast blood glucose range is generally 130-140 mg/dl. Her lunch blood glucose range is generally 130-140 mg/dl. Her dinner blood glucose range is generally 140-180 mg/dl. Her bedtime blood glucose range is generally 140-180 mg/dl. Her overall blood glucose range is 140-180 mg/dl. (She presents with near target glycemic profile despite her recent point-of-care A1c of 9.9%.    She did have A1c as high as 14% recently. ) An ACE inhibitor/angiotensin II receptor blocker is being taken. Eye exam is current.  Hyperlipidemia This is a chronic problem. The current episode  started more than 1 year ago. Exacerbating diseases include diabetes. Factors aggravating her hyperlipidemia include smoking. Pertinent negatives include no chest pain, myalgias or shortness of breath. Current antihyperlipidemic treatment includes statins. Risk factors for coronary artery disease include dyslipidemia, diabetes mellitus, hypertension, obesity, a sedentary lifestyle, post-menopausal and family history.  Hypertension This is a chronic problem. The current episode started more than 1 year ago. Pertinent negatives include no chest pain, headaches, palpitations or shortness of breath. Risk factors for coronary artery disease include diabetes mellitus, dyslipidemia, family history, obesity, post-menopausal state, smoking/tobacco exposure and sedentary lifestyle. Past treatments include angiotensin blockers.    Review of Systems  Constitutional: Positive for fatigue. Negative for chills, fever and unexpected weight change.  HENT: Negative for trouble swallowing and voice change.   Eyes: Negative for visual disturbance.  Respiratory: Negative for cough, shortness of breath and wheezing.   Cardiovascular: Negative for chest pain, palpitations and leg swelling.  Gastrointestinal: Negative for diarrhea, nausea and vomiting.  Endocrine: Positive for polydipsia and polyuria. Negative for cold intolerance, heat intolerance and polyphagia.  Musculoskeletal: Negative for arthralgias and myalgias.  Skin: Negative for color change, pallor, rash and wound.  Neurological: Negative for seizures and headaches.  Psychiatric/Behavioral: Negative for confusion and suicidal ideas.    Objective:    Vitals with BMI 03/05/2020 03/05/2020 02/23/2020  Height 5' 8"  5' 8"  5' 8"   Weight 222 lbs 222 lbs 218 lbs 6 oz  BMI 33.76 77.41 28.78  Systolic 676 - 720  Diastolic 82 - 83  Pulse 67 - 71    BP 128/82   Pulse 67   Ht 5' 8"  (1.727 m)   Wt 222 lb (100.7 kg)   BMI 33.75 kg/m   Wt Readings from Last 3  Encounters:  03/05/20 222 lb (100.7 kg)  03/05/20 222 lb (100.7 kg)  02/23/20 218 lb 6.4 oz (99.1 kg)     Physical Exam Constitutional:      Appearance: She is well-developed.  HENT:     Head: Normocephalic and atraumatic.  Neck:     Thyroid: No thyromegaly.     Trachea: No tracheal deviation.  Cardiovascular:     Rate and Rhythm: Normal rate and regular rhythm.  Pulmonary:     Effort: Pulmonary effort is normal.  Abdominal:     Palpations: Abdomen is soft.     Tenderness: There is no abdominal tenderness. There is no guarding.  Musculoskeletal:        General: Normal range of motion.     Cervical back: Normal range of motion and neck supple.  Skin:    General: Skin is warm and dry.     Coloration: Skin is not pale.     Findings: No erythema or rash.  Neurological:     Mental Status: She is alert and oriented to person, place, and time.     Cranial Nerves: No cranial nerve deficit.     Coordination: Coordination normal.     Deep Tendon Reflexes: Reflexes are normal and symmetric.  Psychiatric:        Judgment: Judgment normal.       CMP ( most recent) CMP     Component Value Date/Time   NA 142 02/23/2020 1127   NA 144 09/04/2017 0916   K 3.6 02/23/2020 1127   K 4.1 09/04/2017 0916   CL 110 02/23/2020 1127   CO2 25 02/23/2020 1127   CO2 26 09/04/2017 0916   GLUCOSE 210 (H) 02/23/2020 1127   GLUCOSE 197 (H) 09/04/2017 0916   BUN 18 02/23/2020 1127   BUN 15.6 09/04/2017 0916   CREATININE 0.88 02/23/2020 1127   CREATININE 1.0 09/04/2017 0916   CALCIUM 9.2 02/23/2020 1127   CALCIUM 9.5 09/04/2017 0916   PROT 6.7 02/23/2020 1127   PROT 6.8 09/04/2017 0916   ALBUMIN 3.7 02/23/2020 1127   ALBUMIN 3.8 09/04/2017 0916   AST 21 02/23/2020 1127   AST 19 09/04/2017 0916   ALT 22 02/23/2020 1127   ALT 18 09/04/2017 0916   ALKPHOS 59 02/23/2020 1127   ALKPHOS 49 09/04/2017 0916   BILITOT 0.4 02/23/2020 1127   BILITOT 0.49 09/04/2017 0916   GFRNONAA >60  02/23/2020 1127   GFRAA >60 02/23/2020 1127     Diabetic Labs (most recent): Lab Results  Component Value Date   HGBA1C 9.9 (A) 02/20/2020   HGBA1C 12.4 (H) 08/23/2013   HGBA1C 12.4 (H) 08/22/2013    Lab Results  Component Value Date   TSH 2.859 05/06/2013   FREET4 0.93 05/06/2013     Assessment & Plan:   1. Type 2 diabetes mellitus with hyperglycemia, without long-term current use of insulin (HCC)  - Megan Mcknight has currently uncontrolled symptomatic type 2 DM since  59 years of age.  She presents with near target glycemic profile despite her recent point-of-care A1c of 9.9%.    She did have A1c as high as 14% recently.   -- Recent labs reviewed.  Her diabetes is recently exacerbated by concurrent use of steroids during treatment with chemo for lymphoma. - I had a long discussion with her about the progressive nature of diabetes and the pathology behind its complications. -her diabetes is complicated by obesity/sedentary life and she remains at a high risk for more acute and chronic complications which include CAD, CVA, CKD, retinopathy, and neuropathy. These are all discussed in detail with her.  - I have counseled her on diet  and weight management  by adopting a carbohydrate restricted/protein rich diet. Patient is encouraged to switch to  unprocessed or minimally processed     complex starch and increased protein intake (animal or plant  source), fruits, and vegetables. -  she is advised to stick to a routine mealtimes to eat 3 meals  a day and avoid unnecessary snacks ( to snack only to correct hypoglycemia).   - she  admits there is a room for improvement in her diet and drink choices. -  Suggestion is made for her to avoid simple carbohydrates  from her diet including Cakes, Sweet Desserts / Pastries, Ice Cream, Soda (diet and regular), Sweet Tea, Candies, Chips, Cookies, Sweet Pastries,  Store Bought Juices, Alcohol in Excess of  1-2 drinks a day, Artificial Sweeteners,  Coffee Creamer, and "Sugar-free" Products. This will help patient to have stable blood glucose profile and potentially avoid unintended weight gain.   - she will be scheduled with Jearld Fenton, RDN, CDE for diabetes education.  - I have approached her with the following individualized plan to manage  her diabetes and patient agrees:   -Based on her presentation with near target glycemic profile, she would not need prandial insulin for now.   -She is advised to lower her Lantus to 70 units nightly,  continue monitoring blood glucose twice a day-daily before breakfast and at bedtime.   - she is warned not to take insulin without proper monitoring per orders.  - she is encouraged to call clinic for blood glucose levels less than 70 or above 200 mg /dl. - she is advised to continue continue Metformin 1000 mg p.o. twice daily, therapeutically suitable for patient .  - she will be considered for incretin therapy as appropriate next visit.  - Specific targets for  A1c;  LDL, HDL,  and Triglycerides were discussed with the patient.  2) Blood Pressure /Hypertension: Her blood pressure is controlled to target. she is advised to continue her current medications including losartan 25 mg p.o. daily with breakfast . 3) Lipids/Hyperlipidemia: She does not have recent lipid panel to review.  She is advised to continue Crestor 10 mg p.o. daily at bedtime.   Side effects and precautions discussed with her.  4)  Weight/Diet:  Body mass index is 33.75 kg/m.  -   clearly complicating her diabetes care.   she is  a candidate for weight loss. I discussed with her the fact that loss of 5 - 10% of her  current body weight will have the most impact on her diabetes management.  Exercise, and detailed carbohydrates information provided  -  detailed on discharge instructions.  5) Chronic Care/Health Maintenance:  -she  is on ACEI/ARB and Statin medications and  is encouraged to initiate and continue to follow up  with Ophthalmology, Dentist,  Podiatrist at least yearly or according to recommendations, and advised to   stay away from smoking. I have recommended yearly flu vaccine and pneumonia vaccine at least every 5 years; moderate intensity exercise for up to 150 minutes weekly; and  sleep for at least 7 hours a day.  - she is  advised to maintain close follow up with Rosita Fire, MD for primary care needs, as well as her other providers for optimal and coordinated care.   - Time spent on this patient care encounter:  35 min, of which > 50% was spent in  counseling and the rest reviewing her blood glucose logs , discussing her hypoglycemia and hyperglycemia episodes, reviewing her current and  previous labs / studies  ( including abstraction from other facilities) and medications  doses and developing a  long term treatment plan and documenting her care.   Please  refer to Patient Instructions for Blood Glucose Monitoring and Insulin/Medications Dosing Guide"  in media tab for additional information. Please  also refer to " Patient Self Inventory" in the Media  tab for reviewed elements of pertinent patient history.  Megan Mcknight participated in the discussions, expressed understanding, and voiced agreement with the above plans.  All questions were answered to her satisfaction. she is encouraged to contact clinic should she have any questions or concerns prior to her return visit.  Follow up plan: - Return in about 10 weeks (around 05/14/2020) for Bring Meter and Logs- A1c in Office.  Glade Lloyd, MD [facility] [address] [place], [place] [address] Phone: [phone]  Fax: [phone]    03/05/2020, 6:39 PM  This note was partially dictated with voice recognition software. Similar sounding words can be transcribed inadequately or may not  be corrected upon review.

## 2020-03-05 NOTE — Patient Instructions (Signed)
Goals Follow MY Plate Eat three balanced meals per day  Drink only water  Cut out snacks. Walk when you can.  Avoid eating after 7 pm Get A1C to 7%.

## 2020-03-05 NOTE — Patient Instructions (Signed)

## 2020-03-05 NOTE — Progress Notes (Signed)
Walk in From Dr. Dorris Fetch. A1C 9.9%. Getting chemo for nonhodgekins lympoma x 4 months. Lantus 80 units once a day- was on 60 units before. 500 mg Metformin BID.  Had BS under control before restarting CHEMO in December 2020.   Medical Nutrition Therapy:  Appt start time: 0900 end time:  0930. Gets chemo once a month for nonhodgekins lymphoma.  Assessment:  Primary concerns today:  Preferred Learning Style:   No preference indicated    Learning Readiness:   Ready  Change in progress   MEDICATIONS:   DIETARY INTAKE: 24-hr recall:  B ( AM): Eggs, oatmeal, bacon and coffee/water Snk ( AM):  L ( PM): Chicken, water Snk ( PM):  D ( PM): baled chicken, brown, rice, cabbage, wate Snk ( PM): 2 carrots Beverages: water  Usual physical activity: Walking and yoga.  Estimated energy needs: 1500 calories 170 g carbohydrates 112 g protein 42 g fat  Progress Towards Goal(s):  In progress.   Nutritional Diagnosis:  NB-1.1 Food and nutrition-related knowledge deficit As related to Diabetes Type 2.  As evidenced by A1C 9.8%.    Intervention: Nutrition and Diabetes education provided on My Plate, CHO counting, meal planning, portion sizes, timing of meals, avoiding snacks between meals unless having a low blood sugar, target ranges for A1C and blood sugars, signs/symptoms and treatment of hyper/hypoglycemia, monitoring blood sugars, taking medications as prescribed, benefits of exercising 30 minutes per day and prevention of complications of DM.  Goals Follow MY Plate Eat three balanced meals per day  Drink only water  Cut out snacks. Walk when you can.  Avoid eating after 7 pm Get A1C to 7%.  Teaching Method Utilized:  Visual Auditory Hands on  Handouts given during visit include:  The Plate Method   Meal Plan   Diabetes Instructions   Barriers to learning/adherence to lifestyle change: chemo and cancer  Demonstrated degree of understanding via:  Teach Back    Monitoring/Evaluation:  Dietary intake, exercise, , and body weight in 3 month(s).

## 2020-03-07 ENCOUNTER — Encounter: Payer: Self-pay | Admitting: Nutrition

## 2020-03-07 DIAGNOSIS — C8460 Anaplastic large cell lymphoma, ALK-positive, unspecified site: Secondary | ICD-10-CM | POA: Diagnosis not present

## 2020-03-07 NOTE — Patient Instructions (Signed)
Goals Eat three meals per day Avoid snacks between meals Eat meals on time Drink only water. Don't eat past 7 pm.  Get A1C down to 7% Increase fresh fruits and vegetables.

## 2020-03-08 ENCOUNTER — Ambulatory Visit: Payer: BC Managed Care – PPO | Attending: Internal Medicine

## 2020-03-08 DIAGNOSIS — Z23 Encounter for immunization: Secondary | ICD-10-CM

## 2020-03-08 NOTE — Progress Notes (Signed)
   Covid-19 Vaccination Clinic  Name:  Megan Mcknight    MRN: [id number] DOB: [birth year]  03/08/2020  [name] was observed post Covid-19 immunization for 15 minutes without incident. She was provided with Vaccine Information Sheet and instruction to access the V-Safe system.   [name] was instructed to call 911 with any severe reactions post vaccine: Marland Kitchen Difficulty breathing  . Swelling of face and throat  . A fast heartbeat  . A bad rash all over body  . Dizziness and weakness   Immunizations Administered    Name Date Dose VIS Date Route   Moderna COVID-19 Vaccine 03/08/2020  8:53 AM 0.5 mL 11/15/2019 Intramuscular   Manufacturer: Moderna   Lot: [id number]   [place]: [id number]

## 2020-03-12 NOTE — Progress Notes (Signed)
Pharmacist Chemotherapy Monitoring - Follow Up Assessment    I verify that I have reviewed each item in the below checklist:  . Regimen for the patient is scheduled for the appropriate day and plan matches scheduled date. Marland Kitchen Appropriate non-routine labs are ordered dependent on drug ordered. . If applicable, additional medications reviewed and ordered per protocol based on lifetime cumulative doses and/or treatment regimen.   Plan for follow-up and/or issues identified: Yes . I-vent associated with next due treatment: Yes . MD and/or nursing notified: No   Kennith Center, Pharm.D., CPP 03/12/2020@9 :03 AM

## 2020-03-16 ENCOUNTER — Other Ambulatory Visit: Payer: Self-pay

## 2020-03-16 ENCOUNTER — Inpatient Hospital Stay: Payer: BC Managed Care – PPO | Attending: Oncology

## 2020-03-16 ENCOUNTER — Inpatient Hospital Stay: Payer: BC Managed Care – PPO

## 2020-03-16 ENCOUNTER — Inpatient Hospital Stay (HOSPITAL_BASED_OUTPATIENT_CLINIC_OR_DEPARTMENT_OTHER): Payer: BC Managed Care – PPO | Admitting: Oncology

## 2020-03-16 VITALS — BP 111/52 | HR 77 | Temp 98.2°F | Resp 18 | Ht 68.0 in | Wt 223.1 lb

## 2020-03-16 DIAGNOSIS — Z9221 Personal history of antineoplastic chemotherapy: Secondary | ICD-10-CM | POA: Diagnosis not present

## 2020-03-16 DIAGNOSIS — C8460 Anaplastic large cell lymphoma, ALK-positive, unspecified site: Secondary | ICD-10-CM | POA: Diagnosis present

## 2020-03-16 DIAGNOSIS — C8100 Nodular lymphocyte predominant Hodgkin lymphoma, unspecified site: Secondary | ICD-10-CM

## 2020-03-16 DIAGNOSIS — Z95828 Presence of other vascular implants and grafts: Secondary | ICD-10-CM

## 2020-03-16 DIAGNOSIS — T451X5A Adverse effect of antineoplastic and immunosuppressive drugs, initial encounter: Secondary | ICD-10-CM | POA: Insufficient documentation

## 2020-03-16 DIAGNOSIS — Z5112 Encounter for antineoplastic immunotherapy: Secondary | ICD-10-CM | POA: Diagnosis present

## 2020-03-16 DIAGNOSIS — G620 Drug-induced polyneuropathy: Secondary | ICD-10-CM | POA: Insufficient documentation

## 2020-03-16 DIAGNOSIS — Z9484 Stem cells transplant status: Secondary | ICD-10-CM | POA: Insufficient documentation

## 2020-03-16 DIAGNOSIS — D630 Anemia in neoplastic disease: Secondary | ICD-10-CM | POA: Diagnosis not present

## 2020-03-16 LAB — CMP (CANCER CENTER ONLY)
ALT: 16 U/L (ref 0–44)
AST: 18 U/L (ref 15–41)
Albumin: 3.5 g/dL (ref 3.5–5.0)
Alkaline Phosphatase: 51 U/L (ref 38–126)
Anion gap: 9 (ref 5–15)
BUN: 14 mg/dL (ref 6–20)
CO2: 24 mmol/L (ref 22–32)
Calcium: 9.1 mg/dL (ref 8.9–10.3)
Chloride: 113 mmol/L — ABNORMAL HIGH (ref 98–111)
Creatinine: 0.85 mg/dL (ref 0.44–1.00)
GFR, Est AFR Am: >60 mL/min (ref >60)
GFR, Estimated: >60 mL/min (ref >60)
Glucose, Bld: 113 mg/dL — ABNORMAL HIGH (ref 70–99)
Potassium: 3.9 mmol/L (ref 3.5–5.1)
Sodium: 146 mmol/L — ABNORMAL HIGH (ref 135–145)
Total Bilirubin: 0.2 mg/dL — ABNORMAL LOW (ref 0.3–1.2)
Total Protein: 6.6 g/dL (ref 6.5–8.1)

## 2020-03-16 LAB — CBC WITH DIFFERENTIAL (CANCER CENTER ONLY)
Abs Immature Granulocytes: 0 10*3/uL (ref 0.00–0.07)
Basophils Absolute: 0 10*3/uL (ref 0.0–0.1)
Basophils Relative: 1 %
Eosinophils Absolute: 0.1 10*3/uL (ref 0.0–0.5)
Eosinophils Relative: 1 %
HCT: 35.8 % — ABNORMAL LOW (ref 36.0–46.0)
Hemoglobin: 11.2 g/dL — ABNORMAL LOW (ref 12.0–15.0)
Immature Granulocytes: 0 %
Lymphocytes Relative: 30 %
Lymphs Abs: 1.3 10*3/uL (ref 0.7–4.0)
MCH: 27.7 pg (ref 26.0–34.0)
MCHC: 31.3 g/dL (ref 30.0–36.0)
MCV: 88.4 fL (ref 80.0–100.0)
Monocytes Absolute: 0.5 10*3/uL (ref 0.1–1.0)
Monocytes Relative: 12 %
Neutro Abs: 2.5 10*3/uL (ref 1.7–7.7)
Neutrophils Relative %: 56 %
Platelet Count: 193 10*3/uL (ref 150–400)
RBC: 4.05 MIL/uL (ref 3.87–5.11)
RDW: 16.7 % — ABNORMAL HIGH (ref 11.5–15.5)
WBC Count: 4.4 10*3/uL (ref 4.0–10.5)
nRBC: 0 % (ref 0.0–0.2)

## 2020-03-16 MED ORDER — DEXAMETHASONE SODIUM PHOSPHATE 10 MG/ML IJ SOLN
INTRAMUSCULAR | Status: AC
  Filled 2020-03-16: qty 1

## 2020-03-16 MED ORDER — SODIUM CHLORIDE 0.9% FLUSH
10.0000 mL | INTRAVENOUS | Status: DC | PRN
  Administered 2020-03-16: 13:00:00 10 mL
  Filled 2020-03-16: qty 10

## 2020-03-16 MED ORDER — SODIUM CHLORIDE 0.9 % IV SOLN
1.8000 mg/kg | Freq: Once | INTRAVENOUS | Status: AC
  Administered 2020-03-16: 180 mg via INTRAVENOUS
  Filled 2020-03-16: qty 36

## 2020-03-16 MED ORDER — DEXAMETHASONE SODIUM PHOSPHATE 10 MG/ML IJ SOLN
10.0000 mg | Freq: Once | INTRAMUSCULAR | Status: AC
  Administered 2020-03-16: 10 mg via INTRAVENOUS

## 2020-03-16 MED ORDER — ACETAMINOPHEN 325 MG PO TABS
650.0000 mg | ORAL_TABLET | Freq: Once | ORAL | Status: AC
  Administered 2020-03-16: 650 mg via ORAL

## 2020-03-16 MED ORDER — ACETAMINOPHEN 325 MG PO TABS
ORAL_TABLET | ORAL | Status: AC
  Filled 2020-03-16: qty 2

## 2020-03-16 MED ORDER — DIPHENHYDRAMINE HCL 50 MG/ML IJ SOLN
INTRAMUSCULAR | Status: AC
  Filled 2020-03-16: qty 1

## 2020-03-16 MED ORDER — DIPHENHYDRAMINE HCL 50 MG/ML IJ SOLN
50.0000 mg | Freq: Once | INTRAMUSCULAR | Status: AC
  Administered 2020-03-16: 50 mg via INTRAVENOUS

## 2020-03-16 MED ORDER — SODIUM CHLORIDE 0.9 % IV SOLN
Freq: Once | INTRAVENOUS | Status: AC
  Filled 2020-03-16: qty 250

## 2020-03-16 MED ORDER — HEPARIN SOD (PORK) LOCK FLUSH 100 UNIT/ML IV SOLN
500.0000 [IU] | Freq: Once | INTRAVENOUS | Status: AC | PRN
  Administered 2020-03-16: 500 [IU]
  Filled 2020-03-16: qty 5

## 2020-03-16 MED ORDER — SODIUM CHLORIDE 0.9% FLUSH
10.0000 mL | Freq: Once | INTRAVENOUS | Status: AC
  Administered 2020-03-16: 10 mL
  Filled 2020-03-16: qty 10

## 2020-03-16 NOTE — Progress Notes (Signed)
[facility] ONCOLOGY OFFICE PROGRESS NOTE 03/16/20   Megan Fire, MD [address] [place] [address]  DIAGNOSIS: 59 year old woman with anaplastic large T-cell lymphoma diagnosed in 2014.  She was found to have CD30 positive disease that relapsed in December 2020.  PRIOR THERAPY:  CHOEP q 21 days started on 05/20/2013.  Completed 6 of 6 planned cycles for CHOEP on 09/05/2013.  Cycle #5 was complicated by ICU-level care secondary to severe hyperglycemia (see prior note for details).  Cycle #6, prednisone was excluded.  She received neulasta after each cycle of therapy.   She underwent autologous SCT on February 4th 2015 at [facility] ([place] [place]).  She has not received any additional therapy after that after achieving remission.  CURRENT THERAPY: Adcetris 1.8 mg/kg every 3 weeks started with cycle 1 on 12/01/2019.  He is here for cycle 6.  INTERVAL HISTORY: Ms. Pember presents today for a follow-up visit.  Since the last visit, she reports no major changes in her health.  She continues to tolerate Adcetris without any major complications.  She is experiencing mild sensory neuropathy in her upper and lower extremities although remains at a lower grade.  She denies any recent hospitalization or illnesses.  She denies any fevers chills or sweats.              ALLERGIES:  is allergic to iodine and shellfish-derived products.  MEDICATIONS: Updated on review.   PHYSICAL EXAMINATION:     Blood pressure (!) 111/52, pulse 77, temperature 98.2 F (36.8 C), temperature source Temporal, resp. rate 18, height 5\' 8"  (1.727 m), weight 223 lb 1.6 oz (101.2 kg), SpO2 100 %.       ECOG 1    General appearance: Comfortable appearing without any discomfort Head: Normocephalic without any trauma Oropharynx: Mucous membranes are moist and pink without any thrush or ulcers. Eyes: Pupils are equal and round reactive to light. Lymph nodes: No cervical,  supraclavicular, inguinal or axillary lymphadenopathy.   Heart:regular rate and rhythm.  S1 and S2 without leg edema. Lung: Clear without any rhonchi or wheezes.  No dullness to percussion. Abdomin: Soft, nontender, nondistended with good bowel sounds.  No hepatosplenomegaly. Musculoskeletal: No joint deformity or effusion.  Full range of motion noted. Neurological: No deficits noted on motor, sensory and deep tendon reflex exam. Skin: No petechial rash or dryness.  Appeared moist.              Labs:  Lab Results  Component Value Date   WBC 4.9 02/23/2020   HGB 11.5 (L) 02/23/2020   HCT 36.6 02/23/2020   MCV 88.0 02/23/2020   PLT 189 02/23/2020   NEUTROABS 2.9 02/23/2020      Chemistry      Component Value Date/Time   NA 142 02/23/2020 1127   NA 144 09/04/2017 0916   K 3.6 02/23/2020 1127   K 4.1 09/04/2017 0916   CL 110 02/23/2020 1127   CO2 25 02/23/2020 1127   CO2 26 09/04/2017 0916   BUN 18 02/23/2020 1127   BUN 15.6 09/04/2017 0916   CREATININE 0.88 02/23/2020 1127   CREATININE 1.0 09/04/2017 0916      Component Value Date/Time   CALCIUM 9.2 02/23/2020 1127   CALCIUM 9.5 09/04/2017 0916   ALKPHOS 59 02/23/2020 1127   ALKPHOS 49 09/04/2017 0916   AST 21 02/23/2020 1127   AST 19 09/04/2017 0916   ALT 22 02/23/2020 1127   ALT 18 09/04/2017 0916  BILITOT 0.4 02/23/2020 1127   BILITOT 0.49 09/04/2017 0916      IMPRESSION: 1. Interval, near complete response to therapy. 2. Residual 8 mm left inguinal lymph node has an SUV max of 4.35, Deauville criteria 4. No other sites of residual metabolically active tumor identified.   ASSESSMENT AND PLAN:    59 year old woman with:    1.  Relapsed T-cell lymphoma diagnosed initially in 2014 and subsequently relapsed in 2020.  Her tumor is CD30 positive anaplastic large T cell lymphoma.     She continues to tolerate Adcetris without any major complications.  PET CT scan obtained on February 28, 2020 was  personally reviewed and showed a near complete response to therapy.  She still has a residual 8 mm left inguinal lymph node but otherwise had an excellent response.  The natural course of this disease was reviewed and future treatment options were discussed.  Continuing this current therapy for the time being and potentially consolidative therapy in the future with additional stem cell transplant versus CAR-T therapy.   After discussion today, the plan is to continue with the same dose and schedule and repeat imaging studies in 3 months.  Potentially 18 cycles of therapy will be the goal.  Dose adjustment may be needed if she developing worsening neuropathy in the future.   2.  IV access: Port-A-Cath currently in place without any issues.  3.  Weight loss: Resolved at this time.  Her weight loss was related to relapsed disease.  4.  Anemia: Due to her disease relapse.  Anemia improved at this time..   5.  Follow-up: She will return in 3 weeks for repeat evaluation.   30  minutes were spent on this encounter.  Time was dedicated to reviewing imaging studies, discussing the natural course of this disease, treatment options and future plan of care discussion.   Zola Button MD 03/16/20

## 2020-03-16 NOTE — Patient Instructions (Signed)
[facility] Discharge Instructions for Patients Receiving Chemotherapy  Today you received the following chemotherapy agent: Bentuximab-vedotin (ADCETRIS)  To help prevent nausea and vomiting after your treatment, we encourage you to take your nausea medication as directed by your MD.   If you develop nausea and vomiting that is not controlled by your nausea medication, call the clinic.   BELOW ARE SYMPTOMS THAT SHOULD BE REPORTED IMMEDIATELY:  *FEVER GREATER THAN 100.5 F  *CHILLS WITH OR WITHOUT FEVER  NAUSEA AND VOMITING THAT IS NOT CONTROLLED WITH YOUR NAUSEA MEDICATION  *UNUSUAL SHORTNESS OF BREATH  *UNUSUAL BRUISING OR BLEEDING  TENDERNESS IN MOUTH AND THROAT WITH OR WITHOUT PRESENCE OF ULCERS  *URINARY PROBLEMS  *BOWEL PROBLEMS  UNUSUAL RASH Items with * indicate a potential emergency and should be followed up as soon as possible.  Feel free to call the clinic should you have any questions or concerns. The clinic phone number is [phone].  Please show the [facility] at check-in to the Emergency Department and triage nurse.  Coronavirus (COVID-19) Are you at risk?  Are you at risk for the Coronavirus (COVID-19)?  To be considered HIGH RISK for Coronavirus (COVID-19), you have to meet the following criteria:  . Traveled to [place], [place], [place], [place] or [place]; or in the [place] to [place], [place], [place], or [place]; and have fever, cough, and shortness of breath within the last 2 weeks of travel OR . Been in close contact with a person diagnosed with COVID-19 within the last 2 weeks and have fever, cough, and shortness of breath . IF YOU DO NOT MEET THESE CRITERIA, YOU ARE CONSIDERED LOW RISK FOR COVID-19.  What to do if you are HIGH RISK for COVID-19?  Marland Kitchen If you are having a medical emergency, call 911. . Seek medical care right away. Before you go to a doctor's office, urgent care or emergency department, call ahead  and tell them about your recent travel, contact with someone diagnosed with COVID-19, and your symptoms. You should receive instructions from your physician's office regarding next steps of care.  . When you arrive at healthcare provider, tell the healthcare staff immediately you have returned from visiting [place], [place], [place], [place] or [place]; or traveled in the [place] to [place], [place], [place], or [place]; in the last two weeks or you have been in close contact with a person diagnosed with COVID-19 in the last 2 weeks.   . Tell the health care staff about your symptoms: fever, cough and shortness of breath. . After you have been seen by a medical provider, you will be either: o Tested for (COVID-19) and discharged home on quarantine except to seek medical care if symptoms worsen, and asked to  - Stay home and avoid contact with others until you get your results (4-5 days)  - Avoid travel on public transportation if possible (such as bus, train, or airplane) or o Sent to the Emergency Department by EMS for evaluation, COVID-19 testing, and possible admission depending on your condition and test results.  What to do if you are LOW RISK for COVID-19?  Reduce your risk of any infection by using the same precautions used for avoiding the common cold or flu:  Marland Kitchen Wash your hands often with soap and warm water for at least 20 seconds.  If soap and water are not readily available, use an alcohol-based hand sanitizer with at least 60% alcohol.  Marland Kitchen  If coughing or sneezing, cover your mouth and nose by coughing or sneezing into the elbow areas of your shirt or coat, into a tissue or into your sleeve (not your hands). . Avoid shaking hands with others and consider head nods or verbal greetings only. . Avoid touching your eyes, nose, or mouth with unwashed hands.  . Avoid close contact with people who are sick. . Avoid places or events with large numbers of people in one location, like  concerts or sporting events. . Carefully consider travel plans you have or are making. . If you are planning any travel outside or inside the [place], visit the CDC's Travelers' Health webpage for the latest health notices. . If you have some symptoms but not all symptoms, continue to monitor at home and seek medical attention if your symptoms worsen. . If you are having a medical emergency, call 911.   [place] / e-Visit: [email or web address]         [facility]: [facility]: [phone]                   [facility]: [phone]

## 2020-03-20 ENCOUNTER — Telehealth: Payer: Self-pay | Admitting: Oncology

## 2020-03-20 NOTE — Telephone Encounter (Signed)
Scheduled per 4/2 los. Called and spoke with pt, confirmed 5/13 appt

## 2020-03-30 ENCOUNTER — Telehealth: Payer: Self-pay | Admitting: *Deleted

## 2020-03-30 NOTE — Telephone Encounter (Signed)
"  [employer] just called me asking status of records request mailed to the office February 13, 2020.  Have you received a request from [employer] along with my signed release for them to receive what they need?"  No forms noted from [employer] in [place].  Most current requests per FMLA/Disability Tracking in January 2021.

## 2020-03-30 NOTE — Progress Notes (Signed)
Pharmacist Chemotherapy Monitoring - Follow Up Assessment    I verify that I have reviewed each item in the below checklist:  . Regimen for the patient is scheduled for the appropriate day and plan matches scheduled date. Marland Kitchen Appropriate non-routine labs are ordered dependent on drug ordered. . If applicable, additional medications reviewed and ordered per protocol based on lifetime cumulative doses and/or treatment regimen.   Plan for follow-up and/or issues identified: No . I-vent associated with next due treatment: Yes . MD and/or nursing notified: No   Kennith Center, Pharm.D., CPP 03/30/2020@12 :56 PM

## 2020-04-05 ENCOUNTER — Inpatient Hospital Stay: Payer: BC Managed Care – PPO

## 2020-04-05 ENCOUNTER — Telehealth: Payer: Self-pay | Admitting: Oncology

## 2020-04-05 ENCOUNTER — Other Ambulatory Visit: Payer: Self-pay

## 2020-04-05 ENCOUNTER — Inpatient Hospital Stay (HOSPITAL_BASED_OUTPATIENT_CLINIC_OR_DEPARTMENT_OTHER): Payer: BC Managed Care – PPO | Admitting: Oncology

## 2020-04-05 VITALS — BP 124/88 | HR 80 | Temp 98.7°F | Resp 18 | Ht 68.0 in | Wt 224.1 lb

## 2020-04-05 DIAGNOSIS — C8100 Nodular lymphocyte predominant Hodgkin lymphoma, unspecified site: Secondary | ICD-10-CM

## 2020-04-05 DIAGNOSIS — C8460 Anaplastic large cell lymphoma, ALK-positive, unspecified site: Secondary | ICD-10-CM

## 2020-04-05 DIAGNOSIS — Z95828 Presence of other vascular implants and grafts: Secondary | ICD-10-CM

## 2020-04-05 DIAGNOSIS — Z5112 Encounter for antineoplastic immunotherapy: Secondary | ICD-10-CM | POA: Diagnosis not present

## 2020-04-05 LAB — CBC WITH DIFFERENTIAL (CANCER CENTER ONLY)
Abs Immature Granulocytes: 0.01 10*3/uL (ref 0.00–0.07)
Basophils Absolute: 0.1 10*3/uL (ref 0.0–0.1)
Basophils Relative: 1 %
Eosinophils Absolute: 0.1 10*3/uL (ref 0.0–0.5)
Eosinophils Relative: 1 %
HCT: 34.9 % — ABNORMAL LOW (ref 36.0–46.0)
Hemoglobin: 11.1 g/dL — ABNORMAL LOW (ref 12.0–15.0)
Immature Granulocytes: 0 %
Lymphocytes Relative: 26 %
Lymphs Abs: 1.7 10*3/uL (ref 0.7–4.0)
MCH: 27.7 pg (ref 26.0–34.0)
MCHC: 31.8 g/dL (ref 30.0–36.0)
MCV: 87 fL (ref 80.0–100.0)
Monocytes Absolute: 0.5 10*3/uL (ref 0.1–1.0)
Monocytes Relative: 8 %
Neutro Abs: 4.2 10*3/uL (ref 1.7–7.7)
Neutrophils Relative %: 64 %
Platelet Count: 218 10*3/uL (ref 150–400)
RBC: 4.01 MIL/uL (ref 3.87–5.11)
RDW: 16.7 % — ABNORMAL HIGH (ref 11.5–15.5)
WBC Count: 6.5 10*3/uL (ref 4.0–10.5)
nRBC: 0 % (ref 0.0–0.2)

## 2020-04-05 LAB — CMP (CANCER CENTER ONLY)
ALT: 14 U/L (ref 0–44)
AST: 18 U/L (ref 15–41)
Albumin: 3.5 g/dL (ref 3.5–5.0)
Alkaline Phosphatase: 48 U/L (ref 38–126)
Anion gap: 10 (ref 5–15)
BUN: 18 mg/dL (ref 6–20)
CO2: 24 mmol/L (ref 22–32)
Calcium: 9.4 mg/dL (ref 8.9–10.3)
Chloride: 110 mmol/L (ref 98–111)
Creatinine: 0.84 mg/dL (ref 0.44–1.00)
GFR, Est AFR Am: >60 mL/min (ref >60)
GFR, Estimated: >60 mL/min (ref >60)
Glucose, Bld: 159 mg/dL — ABNORMAL HIGH (ref 70–99)
Potassium: 3.8 mmol/L (ref 3.5–5.1)
Sodium: 144 mmol/L (ref 135–145)
Total Bilirubin: 0.3 mg/dL (ref 0.3–1.2)
Total Protein: 6.9 g/dL (ref 6.5–8.1)

## 2020-04-05 MED ORDER — SODIUM CHLORIDE 0.9% FLUSH
10.0000 mL | Freq: Once | INTRAVENOUS | Status: AC
  Administered 2020-04-05: 10 mL
  Filled 2020-04-05: qty 10

## 2020-04-05 MED ORDER — SODIUM CHLORIDE 0.9 % IV SOLN
10.0000 mg | Freq: Once | INTRAVENOUS | Status: AC
  Administered 2020-04-05: 10 mg via INTRAVENOUS
  Filled 2020-04-05: qty 10

## 2020-04-05 MED ORDER — SODIUM CHLORIDE 0.9 % IV SOLN
Freq: Once | INTRAVENOUS | Status: AC
  Filled 2020-04-05: qty 250

## 2020-04-05 MED ORDER — HEPARIN SOD (PORK) LOCK FLUSH 100 UNIT/ML IV SOLN
500.0000 [IU] | Freq: Once | INTRAVENOUS | Status: AC | PRN
  Administered 2020-04-05: 12:00:00 500 [IU]
  Filled 2020-04-05: qty 5

## 2020-04-05 MED ORDER — ACETAMINOPHEN 325 MG PO TABS
650.0000 mg | ORAL_TABLET | Freq: Once | ORAL | Status: AC
  Administered 2020-04-05: 10:00:00 650 mg via ORAL

## 2020-04-05 MED ORDER — SODIUM CHLORIDE 0.9% FLUSH
10.0000 mL | INTRAVENOUS | Status: DC | PRN
  Administered 2020-04-05: 10 mL
  Filled 2020-04-05: qty 10

## 2020-04-05 MED ORDER — DIPHENHYDRAMINE HCL 50 MG/ML IJ SOLN
INTRAMUSCULAR | Status: AC
  Filled 2020-04-05: qty 1

## 2020-04-05 MED ORDER — ACETAMINOPHEN 325 MG PO TABS
ORAL_TABLET | ORAL | Status: AC
  Filled 2020-04-05: qty 2

## 2020-04-05 MED ORDER — DIPHENHYDRAMINE HCL 50 MG/ML IJ SOLN
50.0000 mg | Freq: Once | INTRAMUSCULAR | Status: AC
  Administered 2020-04-05: 50 mg via INTRAVENOUS

## 2020-04-05 MED ORDER — SODIUM CHLORIDE 0.9 % IV SOLN
1.8000 mg/kg | Freq: Once | INTRAVENOUS | Status: AC
  Administered 2020-04-05: 180 mg via INTRAVENOUS
  Filled 2020-04-05: qty 36

## 2020-04-05 NOTE — Telephone Encounter (Signed)
Scheduled per 04/22 los, patient has been called and notified.  

## 2020-04-05 NOTE — Progress Notes (Signed)
Per Dr. Alen Blew Patient is OK to treat today without waiting for CMP results

## 2020-04-05 NOTE — Patient Instructions (Signed)
[facility] Discharge Instructions for Patients Receiving Chemotherapy  Today you received the following chemotherapy agent: Bentuximab-vedotin (ADCETRIS)  To help prevent nausea and vomiting after your treatment, we encourage you to take your nausea medication as directed by your MD.   If you develop nausea and vomiting that is not controlled by your nausea medication, call the clinic.   BELOW ARE SYMPTOMS THAT SHOULD BE REPORTED IMMEDIATELY:  *FEVER GREATER THAN 100.5 F  *CHILLS WITH OR WITHOUT FEVER  NAUSEA AND VOMITING THAT IS NOT CONTROLLED WITH YOUR NAUSEA MEDICATION  *UNUSUAL SHORTNESS OF BREATH  *UNUSUAL BRUISING OR BLEEDING  TENDERNESS IN MOUTH AND THROAT WITH OR WITHOUT PRESENCE OF ULCERS  *URINARY PROBLEMS  *BOWEL PROBLEMS  UNUSUAL RASH Items with * indicate a potential emergency and should be followed up as soon as possible.  Feel free to call the clinic should you have any questions or concerns. The clinic phone number is [phone].  Please show the [facility] at check-in to the Emergency Department and triage nurse.  Coronavirus (COVID-19) Are you at risk?  Are you at risk for the Coronavirus (COVID-19)?  To be considered HIGH RISK for Coronavirus (COVID-19), you have to meet the following criteria:  . Traveled to [place], [place], [place], [place] or [place]; or in the [place] to [place], [place], [place], or [place]; and have fever, cough, and shortness of breath within the last 2 weeks of travel OR . Been in close contact with a person diagnosed with COVID-19 within the last 2 weeks and have fever, cough, and shortness of breath . IF YOU DO NOT MEET THESE CRITERIA, YOU ARE CONSIDERED LOW RISK FOR COVID-19.  What to do if you are HIGH RISK for COVID-19?  Marland Kitchen If you are having a medical emergency, call 911. . Seek medical care right away. Before you go to a doctor's office, urgent care or emergency department, call ahead  and tell them about your recent travel, contact with someone diagnosed with COVID-19, and your symptoms. You should receive instructions from your physician's office regarding next steps of care.  . When you arrive at healthcare provider, tell the healthcare staff immediately you have returned from visiting [place], [place], [place], [place] or [place]; or traveled in the [place] to [place], [place], [place], or [place]; in the last two weeks or you have been in close contact with a person diagnosed with COVID-19 in the last 2 weeks.   . Tell the health care staff about your symptoms: fever, cough and shortness of breath. . After you have been seen by a medical provider, you will be either: o Tested for (COVID-19) and discharged home on quarantine except to seek medical care if symptoms worsen, and asked to  - Stay home and avoid contact with others until you get your results (4-5 days)  - Avoid travel on public transportation if possible (such as bus, train, or airplane) or o Sent to the Emergency Department by EMS for evaluation, COVID-19 testing, and possible admission depending on your condition and test results.  What to do if you are LOW RISK for COVID-19?  Reduce your risk of any infection by using the same precautions used for avoiding the common cold or flu:  Marland Kitchen Wash your hands often with soap and warm water for at least 20 seconds.  If soap and water are not readily available, use an alcohol-based hand sanitizer with at least 60% alcohol.  Marland Kitchen  If coughing or sneezing, cover your mouth and nose by coughing or sneezing into the elbow areas of your shirt or coat, into a tissue or into your sleeve (not your hands). . Avoid shaking hands with others and consider head nods or verbal greetings only. . Avoid touching your eyes, nose, or mouth with unwashed hands.  . Avoid close contact with people who are sick. . Avoid places or events with large numbers of people in one location, like  concerts or sporting events. . Carefully consider travel plans you have or are making. . If you are planning any travel outside or inside the [place], visit the CDC's Travelers' Health webpage for the latest health notices. . If you have some symptoms but not all symptoms, continue to monitor at home and seek medical attention if your symptoms worsen. . If you are having a medical emergency, call 911.   [place] / e-Visit: [email or web address]         [facility]: [facility]: [phone]                   [facility]: [phone]

## 2020-04-05 NOTE — Progress Notes (Signed)
[facility] ONCOLOGY OFFICE PROGRESS NOTE 04/05/20   Rosita Fire, MD [address] [place] [address]  DIAGNOSIS: 59 year old woman with CD30 positive anaplastic large T-cell lymphoma with relapsed disease in December 2020.  She was initially diagnosed in 2014 and was in remission until her recent relapse.  PRIOR THERAPY:  CHOEP q 21 days started on 05/20/2013.  Completed 6 of 6 planned cycles for CHOEP on 09/05/2013.  Cycle #5 was complicated by ICU-level care secondary to severe hyperglycemia (see prior note for details).  Cycle #6, prednisone was excluded.  She received neulasta after each cycle of therapy.   She underwent autologous SCT on February 4th 2015 at [facility] ([facility] [place]).  She has not received any additional therapy after that after achieving remission.  CURRENT THERAPY: Adcetris 1.8 mg/kg every 3 weeks started with cycle 1 on 12/01/2019.  He is here for cycle 7.  INTERVAL HISTORY: Ms. Borgmeyer returns today for a repeat evaluation.  Since the last visit, she continues to tolerate treatment without any major complications.  She denies any nausea, vomiting or abdominal pain.  She is reporting grade 1 sensory neuropathy in her upper and lower extremities.  She continues to function reasonably well without any decline in ability to do so.  She denies any fevers or lymphadenopathy.  Her appetite is excellent and of has not lost weight.              ALLERGIES:  is allergic to iodine and shellfish-derived products.  MEDICATIONS: reviewed without changes at this time.   PHYSICAL EXAMINATION:    Blood pressure 124/88, pulse 80, temperature 98.7 F (37.1 C), temperature source Oral, resp. rate 18, height 5\' 8"  (1.727 m), weight 224 lb 1.6 oz (101.7 kg), SpO2 99 %.        ECOG 1    General appearance: Alert, awake without any distress. Head: Atraumatic without abnormalities Oropharynx: Without any thrush or ulcers. Eyes: No  scleral icterus. Lymph nodes: No lymphadenopathy noted in the cervical, supraclavicular, or axillary nodes Heart:regular rate and rhythm, without any murmurs or gallops.   Lung: Clear to auscultation without any rhonchi, wheezes or dullness to percussion. Abdomin: Soft, nontender without any shifting dullness or ascites. Musculoskeletal: No clubbing or cyanosis. Neurological: No motor or sensory deficits. Skin: No rashes or lesions.             Labs:  Lab Results  Component Value Date   WBC 6.5 04/05/2020   HGB 11.1 (L) 04/05/2020   HCT 34.9 (L) 04/05/2020   MCV 87.0 04/05/2020   PLT 218 04/05/2020   NEUTROABS 4.2 04/05/2020      Chemistry      Component Value Date/Time   NA 146 (H) 03/16/2020 1003   NA 144 09/04/2017 0916   K 3.9 03/16/2020 1003   K 4.1 09/04/2017 0916   CL 113 (H) 03/16/2020 1003   CO2 24 03/16/2020 1003   CO2 26 09/04/2017 0916   BUN 14 03/16/2020 1003   BUN 15.6 09/04/2017 0916   CREATININE 0.85 03/16/2020 1003   CREATININE 1.0 09/04/2017 0916      Component Value Date/Time   CALCIUM 9.1 03/16/2020 1003   CALCIUM 9.5 09/04/2017 0916   ALKPHOS 51 03/16/2020 1003   ALKPHOS 49 09/04/2017 0916   AST 18 03/16/2020 1003   AST 19 09/04/2017 0916   ALT 16 03/16/2020 1003   ALT 18 09/04/2017 0916   BILITOT 0.2 (L) 03/16/2020 1003   BILITOT  0.49 09/04/2017 0916      IMPRESSION: 1. Interval, near complete response to therapy. 2. Residual 8 mm left inguinal lymph node has an SUV max of 4.35, Deauville criteria 4. No other sites of residual metabolically active tumor identified.   ASSESSMENT AND PLAN:    59 year old woman with:    1.  CD30 positive anaplastic large T-cell lymphoma relapsed in December 2020.    The natural course of this disease was updated at this time.  She continues to have excellent response to salvage therapy utilizing Adcetris.  Risks and benefits of continuing this treatment is to complete 18 cycles of therapy  were reviewed.  Potential complications that include nausea, vomiting as well as neuropathy were reiterated.  Definitive consolidative therapy with stem cell transplant or CAR-T therapy may be needed.  She is agreeable to continue and will adjust the dose if her neuropathy gets worse.  2.  IV access: Port-A-Cath remains in use without any issues or complications.  3.  Weight loss: Related to her relapsed lymphoma and has resolved at this time.  4.  Anemia: Hemoglobin continues to be adequate at this time.  Her anemia was related to relapsed lymphoma and currently does not require any intervention.   5.  Follow-up: In 3 weeks for the next cycle of therapy.   30  minutes were dedicated to this visit.  The time was spent on reviewing laboratory data, discussing disease status update, treatment options for the future.   Zola Button MD 04/05/20

## 2020-04-05 NOTE — Patient Instructions (Signed)

## 2020-04-10 ENCOUNTER — Ambulatory Visit: Payer: BC Managed Care – PPO | Attending: Internal Medicine

## 2020-04-10 DIAGNOSIS — Z23 Encounter for immunization: Secondary | ICD-10-CM

## 2020-04-10 NOTE — Progress Notes (Signed)
   Covid-19 Vaccination Clinic  Name:  Megan Mcknight    MRN: [id number] DOB: [birth year]  04/10/2020  Megan Mcknight was observed post Covid-19 immunization for 15 minutes without incident. She was provided with Vaccine Information Sheet and instruction to access the V-Safe system.   Megan Mcknight was instructed to call 911 with any severe reactions post vaccine: Marland Kitchen Difficulty breathing  . Swelling of face and throat  . A fast heartbeat  . A bad rash all over body  . Dizziness and weakness   Immunizations Administered    Name Date Dose VIS Date Route   Moderna COVID-19 Vaccine 04/10/2020  9:23 AM 0.5 mL 11/2019 Intramuscular   Manufacturer: Moderna   Lot: [id number]   [place]: [id number]

## 2020-04-20 NOTE — Progress Notes (Addendum)
Pharmacist Chemotherapy Monitoring - Follow Up Assessment    I verify that I have reviewed each item in the below checklist:  . Regimen for the patient is scheduled for the appropriate day and plan matches scheduled date. Marland Kitchen Appropriate non-routine labs are ordered dependent on drug ordered. . If applicable, additional medications reviewed and ordered per protocol based on lifetime cumulative doses and/or treatment regimen.   Plan for follow-up and/or issues identified: No . I-vent associated with next due treatment: Yes . MD and/or nursing notified: No  Britt Boozer 04/20/2020 10:19 AM

## 2020-04-21 ENCOUNTER — Inpatient Hospital Stay (HOSPITAL_COMMUNITY)
Admission: EM | Admit: 2020-04-21 | Discharge: 2020-05-04 | DRG: 823 | Disposition: A | Discharge status: Home Health | Payer: BC Managed Care – PPO | Attending: Infectious Disease | Admitting: Infectious Disease

## 2020-04-21 ENCOUNTER — Emergency Department (HOSPITAL_COMMUNITY): Payer: BC Managed Care – PPO

## 2020-04-21 ENCOUNTER — Encounter (HOSPITAL_COMMUNITY): Payer: Self-pay | Admitting: Emergency Medicine

## 2020-04-21 ENCOUNTER — Other Ambulatory Visit: Payer: Self-pay

## 2020-04-21 DIAGNOSIS — C8460 Anaplastic large cell lymphoma, ALK-positive, unspecified site: Secondary | ICD-10-CM

## 2020-04-21 DIAGNOSIS — J440 Chronic obstructive pulmonary disease with acute lower respiratory infection: Secondary | ICD-10-CM | POA: Diagnosis present

## 2020-04-21 DIAGNOSIS — K219 Gastro-esophageal reflux disease without esophagitis: Secondary | ICD-10-CM | POA: Diagnosis present

## 2020-04-21 DIAGNOSIS — Z794 Long term (current) use of insulin: Secondary | ICD-10-CM | POA: Diagnosis not present

## 2020-04-21 DIAGNOSIS — M47816 Spondylosis without myelopathy or radiculopathy, lumbar region: Secondary | ICD-10-CM | POA: Diagnosis present

## 2020-04-21 DIAGNOSIS — Z91013 Allergy to seafood: Secondary | ICD-10-CM

## 2020-04-21 DIAGNOSIS — I480 Paroxysmal atrial fibrillation: Secondary | ICD-10-CM | POA: Diagnosis present

## 2020-04-21 DIAGNOSIS — E782 Mixed hyperlipidemia: Secondary | ICD-10-CM | POA: Diagnosis present

## 2020-04-21 DIAGNOSIS — D630 Anemia in neoplastic disease: Secondary | ICD-10-CM | POA: Diagnosis present

## 2020-04-21 DIAGNOSIS — A419 Sepsis, unspecified organism: Secondary | ICD-10-CM | POA: Diagnosis not present

## 2020-04-21 DIAGNOSIS — Z888 Allergy status to other drugs, medicaments and biological substances status: Secondary | ICD-10-CM

## 2020-04-21 DIAGNOSIS — E1165 Type 2 diabetes mellitus with hyperglycemia: Secondary | ICD-10-CM | POA: Diagnosis present

## 2020-04-21 DIAGNOSIS — Z83438 Family history of other disorder of lipoprotein metabolism and other lipidemia: Secondary | ICD-10-CM | POA: Diagnosis not present

## 2020-04-21 DIAGNOSIS — E1169 Type 2 diabetes mellitus with other specified complication: Secondary | ICD-10-CM | POA: Diagnosis not present

## 2020-04-21 DIAGNOSIS — C8105 Nodular lymphocyte predominant Hodgkin lymphoma, lymph nodes of inguinal region and lower limb: Secondary | ICD-10-CM | POA: Diagnosis not present

## 2020-04-21 DIAGNOSIS — C8330 Diffuse large B-cell lymphoma, unspecified site: Secondary | ICD-10-CM | POA: Diagnosis not present

## 2020-04-21 DIAGNOSIS — C8100 Nodular lymphocyte predominant Hodgkin lymphoma, unspecified site: Secondary | ICD-10-CM | POA: Diagnosis not present

## 2020-04-21 DIAGNOSIS — R6510 Systemic inflammatory response syndrome (SIRS) of non-infectious origin without acute organ dysfunction: Secondary | ICD-10-CM

## 2020-04-21 DIAGNOSIS — M545 Low back pain, unspecified: Secondary | ICD-10-CM

## 2020-04-21 DIAGNOSIS — C8590 Non-Hodgkin lymphoma, unspecified, unspecified site: Secondary | ICD-10-CM | POA: Diagnosis present

## 2020-04-21 DIAGNOSIS — Z8371 Family history of colonic polyps: Secondary | ICD-10-CM

## 2020-04-21 DIAGNOSIS — Z8249 Family history of ischemic heart disease and other diseases of the circulatory system: Secondary | ICD-10-CM

## 2020-04-21 DIAGNOSIS — Z20822 Contact with and (suspected) exposure to covid-19: Secondary | ICD-10-CM | POA: Diagnosis present

## 2020-04-21 DIAGNOSIS — J302 Other seasonal allergic rhinitis: Secondary | ICD-10-CM | POA: Diagnosis present

## 2020-04-21 DIAGNOSIS — Z90710 Acquired absence of both cervix and uterus: Secondary | ICD-10-CM | POA: Diagnosis not present

## 2020-04-21 DIAGNOSIS — R918 Other nonspecific abnormal finding of lung field: Secondary | ICD-10-CM | POA: Diagnosis not present

## 2020-04-21 DIAGNOSIS — D696 Thrombocytopenia, unspecified: Secondary | ICD-10-CM | POA: Diagnosis present

## 2020-04-21 DIAGNOSIS — C8108 Nodular lymphocyte predominant Hodgkin lymphoma, lymph nodes of multiple sites: Secondary | ICD-10-CM | POA: Diagnosis not present

## 2020-04-21 DIAGNOSIS — D849 Immunodeficiency, unspecified: Secondary | ICD-10-CM | POA: Diagnosis present

## 2020-04-21 DIAGNOSIS — Z87891 Personal history of nicotine dependence: Secondary | ICD-10-CM

## 2020-04-21 DIAGNOSIS — R8281 Pyuria: Secondary | ICD-10-CM | POA: Diagnosis present

## 2020-04-21 DIAGNOSIS — Z9889 Other specified postprocedural states: Secondary | ICD-10-CM | POA: Diagnosis not present

## 2020-04-21 DIAGNOSIS — J189 Pneumonia, unspecified organism: Secondary | ICD-10-CM | POA: Diagnosis present

## 2020-04-21 DIAGNOSIS — I10 Essential (primary) hypertension: Secondary | ICD-10-CM | POA: Diagnosis present

## 2020-04-21 DIAGNOSIS — Z85528 Personal history of other malignant neoplasm of kidney: Secondary | ICD-10-CM

## 2020-04-21 DIAGNOSIS — E11649 Type 2 diabetes mellitus with hypoglycemia without coma: Secondary | ICD-10-CM | POA: Diagnosis not present

## 2020-04-21 DIAGNOSIS — E876 Hypokalemia: Secondary | ICD-10-CM | POA: Diagnosis present

## 2020-04-21 DIAGNOSIS — Z8719 Personal history of other diseases of the digestive system: Secondary | ICD-10-CM

## 2020-04-21 DIAGNOSIS — R509 Fever, unspecified: Secondary | ICD-10-CM

## 2020-04-21 DIAGNOSIS — D649 Anemia, unspecified: Secondary | ICD-10-CM | POA: Diagnosis present

## 2020-04-21 DIAGNOSIS — Z85118 Personal history of other malignant neoplasm of bronchus and lung: Secondary | ICD-10-CM

## 2020-04-21 DIAGNOSIS — J181 Lobar pneumonia, unspecified organism: Secondary | ICD-10-CM

## 2020-04-21 DIAGNOSIS — R0600 Dyspnea, unspecified: Secondary | ICD-10-CM | POA: Diagnosis not present

## 2020-04-21 DIAGNOSIS — Z79899 Other long term (current) drug therapy: Secondary | ICD-10-CM

## 2020-04-21 LAB — URINALYSIS, ROUTINE W REFLEX MICROSCOPIC
Bilirubin Urine: NEGATIVE
Glucose, UA: NEGATIVE mg/dL
Ketones, ur: NEGATIVE mg/dL
Nitrite: NEGATIVE
Protein, ur: 100 mg/dL — AB
Specific Gravity, Urine: 1.012 (ref 1.005–1.030)
pH: 6 (ref 5.0–8.0)

## 2020-04-21 LAB — BRAIN NATRIURETIC PEPTIDE: B Natriuretic Peptide: 41 pg/mL (ref 0.0–100.0)

## 2020-04-21 LAB — CBC WITH DIFFERENTIAL/PLATELET
Abs Immature Granulocytes: 0.04 10*3/uL (ref 0.00–0.07)
Basophils Absolute: 0 10*3/uL (ref 0.0–0.1)
Basophils Relative: 0 %
Eosinophils Absolute: 0 10*3/uL (ref 0.0–0.5)
Eosinophils Relative: 0 %
HCT: 32.7 % — ABNORMAL LOW (ref 36.0–46.0)
Hemoglobin: 10.2 g/dL — ABNORMAL LOW (ref 12.0–15.0)
Immature Granulocytes: 0 %
Lymphocytes Relative: 19 %
Lymphs Abs: 1.8 10*3/uL (ref 0.7–4.0)
MCH: 26.7 pg (ref 26.0–34.0)
MCHC: 31.2 g/dL (ref 30.0–36.0)
MCV: 85.6 fL (ref 80.0–100.0)
Monocytes Absolute: 1.3 10*3/uL — ABNORMAL HIGH (ref 0.1–1.0)
Monocytes Relative: 14 %
Neutro Abs: 6.4 10*3/uL (ref 1.7–7.7)
Neutrophils Relative %: 67 %
Platelets: 238 10*3/uL (ref 150–400)
RBC: 3.82 MIL/uL — ABNORMAL LOW (ref 3.87–5.11)
RDW: 17.3 % — ABNORMAL HIGH (ref 11.5–15.5)
WBC: 9.5 10*3/uL (ref 4.0–10.5)
nRBC: 0 % (ref 0.0–0.2)

## 2020-04-21 LAB — RESPIRATORY PANEL BY RT PCR (FLU A&B, COVID)
Influenza A by PCR: NEGATIVE
Influenza B by PCR: NEGATIVE
SARS Coronavirus 2 by RT PCR: NEGATIVE

## 2020-04-21 LAB — COMPREHENSIVE METABOLIC PANEL WITH GFR
ALT: 31 U/L (ref 0–44)
AST: 36 U/L (ref 15–41)
Albumin: 2.9 g/dL — ABNORMAL LOW (ref 3.5–5.0)
Alkaline Phosphatase: 57 U/L (ref 38–126)
Anion gap: 10 (ref 5–15)
BUN: 10 mg/dL (ref 6–20)
CO2: 27 mmol/L (ref 22–32)
Calcium: 8.8 mg/dL — ABNORMAL LOW (ref 8.9–10.3)
Chloride: 100 mmol/L (ref 98–111)
Creatinine, Ser: 0.87 mg/dL (ref 0.44–1.00)
GFR calc Af Amer: >60 mL/min
GFR calc non Af Amer: >60 mL/min
Glucose, Bld: 105 mg/dL — ABNORMAL HIGH (ref 70–99)
Potassium: 2.9 mmol/L — ABNORMAL LOW (ref 3.5–5.1)
Sodium: 137 mmol/L (ref 135–145)
Total Bilirubin: 0.8 mg/dL (ref 0.3–1.2)
Total Protein: 7.5 g/dL (ref 6.5–8.1)

## 2020-04-21 LAB — APTT: aPTT: 34 s (ref 24–36)

## 2020-04-21 LAB — LACTIC ACID, PLASMA
Lactic Acid, Venous: 0.8 mmol/L (ref 0.5–1.9)
Lactic Acid, Venous: 0.8 mmol/L (ref 0.5–1.9)

## 2020-04-21 LAB — PROTIME-INR
INR: 1.3 — ABNORMAL HIGH (ref 0.8–1.2)
Prothrombin Time: 15.6 s — ABNORMAL HIGH (ref 11.4–15.2)

## 2020-04-21 MED ORDER — AZITHROMYCIN 500 MG PO TABS
500.0000 mg | ORAL_TABLET | Freq: Every day | ORAL | Status: AC
  Administered 2020-04-22 – 2020-04-26 (×5): 500 mg via ORAL
  Filled 2020-04-21: qty 1
  Filled 2020-04-21: qty 2
  Filled 2020-04-21 (×2): qty 1
  Filled 2020-04-21: qty 2
  Filled 2020-04-21: qty 1

## 2020-04-21 MED ORDER — SODIUM CHLORIDE 0.9 % IV SOLN
1.0000 g | Freq: Once | INTRAVENOUS | Status: AC
  Administered 2020-04-21: 1 g via INTRAVENOUS
  Filled 2020-04-21: qty 10

## 2020-04-21 MED ORDER — SODIUM CHLORIDE 0.9 % IV BOLUS
1000.0000 mL | Freq: Once | INTRAVENOUS | Status: AC
  Administered 2020-04-21: 20:00:00 1000 mL via INTRAVENOUS

## 2020-04-21 MED ORDER — MORPHINE SULFATE (PF) 2 MG/ML IV SOLN
2.0000 mg | INTRAVENOUS | Status: DC | PRN
  Administered 2020-04-22 – 2020-05-03 (×11): 2 mg via INTRAVENOUS
  Filled 2020-04-21 (×11): qty 1

## 2020-04-21 MED ORDER — SODIUM CHLORIDE 0.9 % IV SOLN: Freq: Once | INTRAVENOUS | Status: AC

## 2020-04-21 MED ORDER — SODIUM CHLORIDE 0.9 % IV SOLN: 1.0000 g | INTRAVENOUS | Status: DC

## 2020-04-21 MED ORDER — ACETAMINOPHEN 325 MG PO TABS
650.0000 mg | ORAL_TABLET | Freq: Four times a day (QID) | ORAL | Status: DC | PRN
  Administered 2020-04-22 – 2020-05-01 (×22): 650 mg via ORAL
  Filled 2020-04-21 (×24): qty 2

## 2020-04-21 MED ORDER — SODIUM CHLORIDE 0.9 % IV SOLN
500.0000 mg | INTRAVENOUS | Status: DC
  Administered 2020-04-21 – 2020-04-22 (×2): 500 mg via INTRAVENOUS
  Filled 2020-04-21 (×2): qty 500

## 2020-04-21 MED ORDER — ACETAMINOPHEN 500 MG PO TABS
1000.0000 mg | ORAL_TABLET | Freq: Once | ORAL | Status: AC
  Administered 2020-04-21: 19:00:00 1000 mg via ORAL
  Filled 2020-04-21: qty 2

## 2020-04-21 MED ORDER — ALBUTEROL SULFATE (2.5 MG/3ML) 0.083% IN NEBU
2.5000 mg | INHALATION_SOLUTION | RESPIRATORY_TRACT | Status: DC | PRN
  Administered 2020-04-24 – 2020-04-29 (×4): 2.5 mg via RESPIRATORY_TRACT
  Filled 2020-04-21 (×5): qty 3

## 2020-04-21 MED ORDER — FENTANYL CITRATE (PF) 100 MCG/2ML IJ SOLN
50.0000 ug | Freq: Once | INTRAMUSCULAR | Status: AC
  Administered 2020-04-21: 20:00:00 50 ug via INTRAVENOUS
  Filled 2020-04-21: qty 2

## 2020-04-21 MED ORDER — ALBUTEROL SULFATE HFA 108 (90 BASE) MCG/ACT IN AERS: 1.0000 | INHALATION_SPRAY | RESPIRATORY_TRACT | Status: DC | PRN

## 2020-04-21 MED ORDER — ENOXAPARIN SODIUM 40 MG/0.4ML ~~LOC~~ SOLN
40.0000 mg | Freq: Every day | SUBCUTANEOUS | Status: DC
  Administered 2020-04-22 – 2020-05-03 (×12): 40 mg via SUBCUTANEOUS
  Filled 2020-04-21 (×12): qty 0.4

## 2020-04-21 MED ORDER — POTASSIUM CHLORIDE CRYS ER 20 MEQ PO TBCR
40.0000 meq | EXTENDED_RELEASE_TABLET | Freq: Once | ORAL | Status: AC
  Administered 2020-04-21: 21:00:00 40 meq via ORAL
  Filled 2020-04-21: qty 2

## 2020-04-21 MED ORDER — POTASSIUM CHLORIDE 10 MEQ/100ML IV SOLN
10.0000 meq | Freq: Once | INTRAVENOUS | Status: AC
  Administered 2020-04-21: 10 meq via INTRAVENOUS
  Filled 2020-04-21: qty 100

## 2020-04-21 NOTE — ED Triage Notes (Signed)
Patient c/o lower back pain x2 weeks. Per patient started after she worked out and then got on [employer]. Patient states pain is in right lower back mostly and radiates into right hip and leg. Denies any complications with BMs or urination. Denies any urinary symptoms.

## 2020-04-21 NOTE — ED Provider Notes (Signed)
[facility] EMERGENCY DEPARTMENT Provider Note   CSN: [id number] Arrival date & time: 04/21/20  1808     History Chief Complaint  Patient presents with  . Back Pain    Megan Mcknight is a 59 y.o. female with a history of non-Hodgkin's lymphoma receiving chemotherapy, prior stem cell transplant, diabetes mellitus, hyperlipidemia, and A. fib with RVR who presents to the emergency department with complaints of back pain x 2 weeks. Patient states pain is located in the right lower back, radiates into the RLE at times, it is constant, worse at night and with certain movements, mildly alleviated by tylenol. Pain began the day after she did a large portion of yard work. Denies numbness, tingling, weakness, saddle anesthesia, incontinence to bowel/bladder, fever, chills, IV drug use, dysuria, frequency, or urgency. 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  She was noted to be febrile on arrival which she denies being aware of until she came into the ED.  She states she has had a cough which she attributed to allergies, denies dyspnea, nausea, vomiting, abdominal pain, diarrhea, or urinary symptoms.  She has had both doses of her COVID-19 vaccine. HPI     Past Medical History:  Diagnosis Date  . Acute bronchitis   . Anaplastic large cell lymphoma 2014   remission since 2015  . Asthma   . Cancer ([place])    lung and kidney  . Colon polyps 02/2013   Per colonoscopy  . Coma ([place])   . Diabetes mellitus without complication ([place])   . Diverticulosis 02/2013   Per colonoscopy  . Dysrhythmia   . Former light tobacco smoker   . GERD (gastroesophageal reflux disease)   . H/O stem cell transplant ([place]) 2015  . Hyperlipidemia   . Nerve pain    Left leg  . Non Hodgkin's lymphoma ([place])   .  Seasonal allergies     Patient Active Problem List   Diagnosis Date Noted  . Type 2 diabetes mellitus with hyperglycemia, without long-term current use of insulin ([place]) 02/20/2020  . Essential hypertension, benign 02/20/2020  . Mixed hyperlipidemia 02/20/2020  . Port-A-Cath in place 01/12/2020  . Trichomonal infection 02/17/2019  . PTTD (posterior tibial tendon dysfunction) 08/21/2015  . Transaminitis 05/31/2014  . Inguinal adenopathy 10/20/2013  . Breast mass, left 10/20/2013  . Neuropathy due to chemotherapeutic drug ([place]) 09/29/2013  . Tumor lysis syndrome 08/21/2013  . Hyperglycemia without ketosis 08/21/2013  . Acute renal failure ([place]) 08/21/2013  . Acute encephalopathy 08/21/2013  . Hyperkalemia 08/21/2013  . Lymphoma ([place]) 05/19/2013  . Shortness of breath 05/06/2013  . Atrial fibrillation with RVR ([place]) 05/06/2013  . Bilateral pneumonia 05/06/2013  . Acute renal insufficiency 05/06/2013  . Normocytic anemia 05/06/2013  . GERD (gastroesophageal reflux disease) 02/02/2012    Past Surgical History:  Procedure Laterality Date  . bones removed from Baby toes    . COLONOSCOPY  02/16/2012   Procedure: COLONOSCOPY;  Surgeon: Daneil Dolin, MD;  Location: [facility];  Service: Endoscopy;  Laterality: N/A;  1:30  . COLONOSCOPY N/A 03/26/2017   Procedure: COLONOSCOPY;  Surgeon: Daneil Dolin, MD;  Location: [facility];  Service: Endoscopy;  Laterality: N/A;  930  . IR IMAGING GUIDED PORT INSERTION  12/19/2019  . MEDIASTINOSCOPY N/A 05/13/2013   Procedure:  MEDIASTINOSCOPY;  Surgeon: Melrose Nakayama, MD;  Location: [facility];  Service: Thoracic;  Laterality: N/A;  . PARTIAL HYSTERECTOMY    . VAGINAL HYSTERECTOMY     partial hyst   . VIDEO BRONCHOSCOPY WITH ENDOBRONCHIAL ULTRASOUND N/A 05/13/2013   Procedure: VIDEO BRONCHOSCOPY WITH ENDOBRONCHIAL ULTRASOUND;  Surgeon: Melrose Nakayama, MD;  Location: [facility] OR;  Service: Thoracic;  Laterality: N/A;     OB History     Gravida  3   Para      Term      Preterm      AB      Living  3     SAB      TAB      Ectopic      Multiple      Live Births  3           Family History  Problem Relation Age of Onset  . Colon polyps Mother   . Hypertension Mother   . Heart disease Mother   . Hyperlipidemia Mother   . Cancer Father        prostate  . Hypertension Father   . Hyperlipidemia Father   . Heart defect Other        family history   . Asthma Other        family history     Social History   Tobacco Use  . Smoking status: Former Smoker    Packs/day: 0.50    Types: Cigarettes    Quit date: 04/19/2013    Years since quitting: 7.0  . Smokeless tobacco: Never Used  Substance Use Topics  . Alcohol use: No  . Drug use: No    Home Medications Prior to Admission medications   Medication Sig Start Date End Date Taking? Authorizing Provider  Accu-Chek Softclix Lancets lancets CHECK BLOOD SUGAR TWICE A DAY 02/23/20   [provider]  albuterol (PROVENTIL HFA;VENTOLIN HFA) 108 (90 Base) MCG/ACT inhaler Inhale 2 puffs into the lungs every 4 (four) hours as needed for wheezing or shortness of breath. 10/27/16   Orpah Greek, MD  amLODipine (NORVASC) 2.5 MG tablet tablet 12/13/19   [provider]  ammonium lactate (AMLACTIN) 12 % cream APPLY TO AFFECTED AREA ONCE A DAY FOR 90 DAYS 03/05/20   [provider]  blood glucose meter kit and supplies KIT 1 each by Does not apply route 4 (four) times daily. Dispense based on patient and insurance preference. Use up to four times daily as directed. (FOR ICD-9 250.00, 250.01). 02/21/20   Cassandria Anger, MD  Blood Glucose Monitoring Suppl (ACCU-CHEK GUIDE) w/Device KIT 1 Piece by Does not apply route as directed. 02/20/20   Cassandria Anger, MD  glucose blood (ACCU-CHEK GUIDE) test strip Use as instructed 02/20/20   Cassandria Anger, MD  Insulin Pen Needle (B-D ULTRAFINE III SHORT PEN) 31G X 8 MM MISC 1  each by Does not apply route as directed. 02/20/20   Cassandria Anger, MD  LANTUS SOLOSTAR 100 UNIT/ML Solostar Pen Inject 70 Units into the skin at bedtime. 03/05/20   Cassandria Anger, MD  latanoprost (XALATAN) 0.005 % ophthalmic solution 1 drop at bedtime. 12/13/19   [provider]  lidocaine-prilocaine (EMLA) cream Apply 1 application topically as needed. 12/22/19   Wyatt Portela, MD  losartan (COZAAR) 25 MG tablet tablet 12/13/19   [provider]  metFORMIN (GLUCOPHAGE) 500 MG tablet Take by mouth 2 (two) times daily with a  meal.    [provider]  metoprolol tartrate (LOPRESSOR) 25 MG tablet Take 25 mg by mouth 2 (two) times daily.     [provider]  metroNIDAZOLE (METROGEL) 0.75 % vaginal gel Place 1 Applicatorful vaginally at bedtime. Apply one applicatorful to vagina at bedtime for 5 days 08/08/19   Jonnie Kind, MD  montelukast (SINGULAIR) 10 MG tablet Take 10 mg by mouth at bedtime.    [provider]  prochlorperazine (COMPAZINE) 10 MG tablet Take 1 tablet (10 mg total) by mouth every 6 (six) hours as needed for nausea or vomiting. 12/01/19   Wyatt Portela, MD  rosuvastatin (CRESTOR) 10 MG tablet Take 10 mg by mouth daily.    [provider]    Allergies    Iodine and Shellfish-derived products  Review of Systems   Review of Systems  Constitutional: Negative for chills and fever (prior to ED arrival).  HENT: Negative for ear pain and sore throat.   Respiratory: Positive for cough. Negative for shortness of breath.   Cardiovascular: Negative for chest pain.  Gastrointestinal: Negative for abdominal pain, diarrhea, nausea and vomiting.  Genitourinary: Negative for dysuria, frequency, hematuria and urgency.  Musculoskeletal: Positive for back pain.  Neurological: Negative for weakness and numbness.       Negative for incontinence or saddle anesthesia.  All other systems reviewed and are negative.   Physical  Exam Updated Vital Signs BP 133/84 (BP Location: Right Arm)   Pulse (!) 115   Temp (!) 102.9 F (39.4 C) (Oral)   Resp 18   Ht 5' 8"  (1.727 m)   Wt 99.8 kg   SpO2 99%   BMI 33.45 kg/m   Physical Exam Vitals and nursing note reviewed.  Constitutional:      General: She is not in acute distress.    Appearance: She is well-developed. She is not toxic-appearing.  HENT:     Head: Normocephalic and atraumatic.     Right Ear: Ear canal normal. Tympanic membrane is not perforated, erythematous, retracted or bulging.     Left Ear: Ear canal normal. Tympanic membrane is not perforated, erythematous, retracted or bulging.     Ears:     Comments: No mastoid erythema/swelling/tenderness.     Nose:     Right Sinus: No maxillary sinus tenderness or frontal sinus tenderness.     Left Sinus: No maxillary sinus tenderness or frontal sinus tenderness.     Mouth/Throat:     Pharynx: Uvula midline. No oropharyngeal exudate or posterior oropharyngeal erythema.     Comments: Posterior oropharynx is symmetric appearing. Patient tolerating own secretions without difficulty. No trismus. No drooling. No hot potato voice. No swelling beneath the tongue, submandibular compartment is soft.  Eyes:     General:        Right eye: No discharge.        Left eye: No discharge.     Conjunctiva/sclera: Conjunctivae normal.     Pupils: Pupils are equal, round, and reactive to light.  Cardiovascular:     Rate and Rhythm: Normal rate and regular rhythm.     Heart sounds: No murmur.  Pulmonary:     Effort: Pulmonary effort is normal. No respiratory distress.     Breath sounds: Normal breath sounds. No wheezing, rhonchi or rales.  Abdominal:     General: There is no distension.     Palpations: Abdomen is soft.     Tenderness: There is no abdominal tenderness. There is no  guarding or rebound.  Musculoskeletal:     Cervical back: Normal range of motion and neck supple. No edema or rigidity. No spinous process  tenderness or muscular tenderness.     Comments: No obvious deformity, appreciable swelling, erythema, ecchymosis, significant open wounds, or increased warmth.  Extremities: Normal ROM. Nontender.  Back: No point/focal vertebral tenderness, no palpable step off or crepitus.  Patient tender to the right lumbar paraspinal muscles.  Lymphadenopathy:     Cervical: No cervical adenopathy.  Skin:    General: Skin is warm and dry.     Findings: No rash.  Neurological:     Mental Status: She is alert.     Deep Tendon Reflexes:     Reflex Scores:      Patellar reflexes are 2+ on the right side and 2+ on the left side.    Comments: Sensation grossly intact to bilateral lower extremities. 5/5 symmetric strength with plantar/dorsiflexion bilaterally. Gait is intact without obvious foot drop.   Psychiatric:        Behavior: Behavior normal.    ED Results / Procedures / Treatments   Labs (all labs ordered are listed, but only abnormal results are displayed) Labs Reviewed  COMPREHENSIVE METABOLIC PANEL - Abnormal; Notable for the following components:      Result Value   Potassium 2.9 (*)    Glucose, Bld 105 (*)    Calcium 8.8 (*)    Albumin 2.9 (*)    All other components within normal limits  CBC WITH DIFFERENTIAL/PLATELET - Abnormal; Notable for the following components:   RBC 3.82 (*)    Hemoglobin 10.2 (*)    HCT 32.7 (*)    RDW 17.3 (*)    Monocytes Absolute 1.3 (*)    All other components within normal limits  PROTIME-INR - Abnormal; Notable for the following components:   Prothrombin Time 15.6 (*)    INR 1.3 (*)    All other components within normal limits  URINALYSIS, ROUTINE W REFLEX MICROSCOPIC - Abnormal; Notable for the following components:   APPearance HAZY (*)    Hgb urine dipstick MODERATE (*)    Protein, ur 100 (*)    Leukocytes,Ua TRACE (*)    Bacteria, UA RARE (*)    All other components within normal limits  CULTURE, BLOOD (ROUTINE X 2)  CULTURE, BLOOD (ROUTINE  X 2)  URINE CULTURE  RESPIRATORY PANEL BY RT PCR (FLU A&B, COVID)  LACTIC ACID, PLASMA  APTT  BRAIN NATRIURETIC PEPTIDE  LACTIC ACID, PLASMA    EKG EKG Interpretation  Date/Time:  Saturday Apr 21 2020 19:17:49 EDT Ventricular Rate:  109 PR Interval:    QRS Duration: 89 QT Interval:  323 QTC Calculation: 435 R Axis:   -27 Text Interpretation: Sinus tachycardia Borderline left axis deviation Low voltage, precordial leads Abnormal R-wave progression, early transition Borderline T abnormalities, anterior leads No significant change since prior 7/19 Confirmed by Aletta Edouard [phone]) on 04/21/2020 7:20:37 PM   Radiology DG Lumbar Spine Complete  Result Date: 04/21/2020 CLINICAL DATA:  Back pain. EXAM: LUMBAR SPINE - COMPLETE 4+ VIEW COMPARISON:  June 16, 2018 FINDINGS: There is no evidence of lumbar spine fracture. Alignment is normal. There mild disc height loss at the L5-S1 level. IMPRESSION: Negative. Electronically Signed   By: Constance Holster M.D.   On: 04/21/2020 20:45   DG Chest Port 1 View  Result Date: 04/21/2020 CLINICAL DATA:  Fever EXAM: PORTABLE CHEST 1 VIEW COMPARISON:  Chest radiograph dated 06/16/2018 FINDINGS:  The heart size is within normal limits. Vascular calcifications are seen in the aortic arch. Mild-to-moderate patchy diffuse bilateral interstitial and airspace opacities are seen. There is no pleural effusion or pneumothorax. The visualized skeletal structures are unremarkable. A left internal jugular central venous port catheter tip overlies the superior vena cava. IMPRESSION: Mild-to-moderate patchy diffuse bilateral interstitial and airspace opacities may reflect pneumonia and/or pulmonary edema. Aortic Atherosclerosis (ICD10-I70.0). Electronically Signed   By: Zerita Boers M.D.   On: 04/21/2020 19:32    Procedures Procedures (including critical care time)  Medications Ordered in ED Medications  azithromycin (ZITHROMAX) 500 mg in sodium chloride 0.9 % 250  mL IVPB (500 mg Intravenous New Bag/Given 04/21/20 2135)  potassium chloride 10 mEq in 100 mL IVPB (10 mEq Intravenous New Bag/Given 04/21/20 2107)  sodium chloride 0.9 % bolus 1,000 mL (1,000 mLs Intravenous New Bag/Given 04/21/20 2006)  cefTRIAXone (ROCEPHIN) 1 g in sodium chloride 0.9 % 100 mL IVPB (0 g Intravenous Stopped 04/21/20 2040)  fentaNYL (SUBLIMAZE) injection 50 mcg (50 mcg Intravenous Given 04/21/20 2001)  acetaminophen (TYLENOL) tablet 1,000 mg (1,000 mg Oral Given 04/21/20 1925)  potassium chloride SA (KLOR-CON) CR tablet 40 mEq (40 mEq Oral Given 04/21/20 2105)    ED Course  I have reviewed the triage vital signs and the nursing notes.  Pertinent labs & imaging results that were available during my care of the patient were reviewed by me and considered in my medical decision making (see chart for details).  Clinical Course as of Apr 21 1921  Sat Apr 21, 4445  [id number] 59 year old female with lymphoma on active chemo here with back pain which sounds very muscular.  Unfortunately she is febrile to 102.9 and tachycardic.  She is getting sepsis labs and likely will need admission.   [MB]    Clinical Course User Index [MB] Hayden Rasmussen, MD   [name] was evaluated in Emergency Department on 04/21/2020 for the symptoms described in the history of present illness. He/she was evaluated in the context of the global COVID-19 pandemic, which necessitated consideration that the patient might be at risk for infection with the SARS-CoV-2 virus that causes COVID-19. Institutional protocols and algorithms that pertain to the evaluation of patients at risk for COVID-19 are in a state of rapid change based on information released by regulatory bodies including the CDC and federal and state organizations. These policies and algorithms were followed during the patient's care in the ED.  MDM Rules/Calculators/A&P                     Patient with history of non-Hodgkin's lymphoma currently receiving  chemotherapy presents to the emergency department with chief complaint of back pain for the past 2 weeks.  On arrival she is noted to be febrile to 102.9 with likely subsequent tachycardia.  She is nontoxic-appearing.  Her back pain seems musculoskeletal given activity day prior to onset and reproducible tenderness with paraspinal muscle palpation, however given she is febrile & tachycardic she will need further assessment. Fluids & abx started with evaluation for sepsis initiated as she meets SIRS criteria. She is not hypotensive therefore 30 cc/kg bolus not initially administered.  Additional history obtained:  Additional history obtained from chart and nursing note review.  Lab Tests:  I Ordered, reviewed, and interpreted labs, which included:  CBC: No leukocytosis.  Anemia mildly worsened from prior. CMP: Hypokalemia, mild hypocalcemia.  Renal function and LFTs within normal limits. Lactic acid: WNL Urinalysis: No  obvious UTI. APTT: WNL PT/INR: Very mildly elevated. Imaging Studies ordered:  I ordered imaging studies which included CXR & L spine xray, I independently visualized and interpreted imaging which showed: CXR: Mild-to-moderate patchy diffuse bilateral interstitial and airspace opacities may reflect pneumonia and/or pulmonary edema. Aortic Atherosclerosis L spine: Negative  ED Course:  18;15: Fentanyl ordered for back pain on initial assessment.  Fluids and antibiotics started due to concern for sepsis of unknown source at this time.  Work-up notable for findings concerning for pneumonia on chest x-ray, feel that pulmonary edema is less likely without peripheral edema and with a BNP which is within normal limits.  At this time pneumonia is suspected source of fever, initially was considering epidural abscess given patient with back pain and a fever-however back pain initially seemed more musculoskeletal, and with her history of cough and chest x-ray findings for pneumonia this seems  somewhat more likely.  She is not in respiratory distress.  She has no neurologic deficits to raise concern for cauda equina or cord compression. Her hypokalemia will be orally and intravenously replaced.   On reassessment following analgesics patient is feeling better. Will consult hospitalist service for admission for pneumonia and concern for sepsis.  Patient updated on results and plan of care, she is in agreement.  21:18: CONSULT: Discussed case with hospitalist Dr. Humphrey Rolls who accepts admission.   This is a shared visit with supervising physician Dr. Melina Copa who has independently evaluated patient & provided guidance in evaluation/management/disposition, in agreement with care   Portions of this note were generated with Dragon dictation software. Dictation errors may occur despite best attempts at proofreading.  Final Clinical Impression(s) / ED Diagnoses Final diagnoses:  SIRS (systemic inflammatory response syndrome) ([place])  Acute right-sided low back pain, unspecified whether sciatica present  Community acquired pneumonia, unspecified laterality    Rx / DC Orders ED Discharge Orders    None       Leafy Kindle 04/21/20 2145    Hayden Rasmussen, MD 04/22/20 [phone]

## 2020-04-21 NOTE — ED Triage Notes (Signed)
Patient does have a temp of 102.9 and HR of 117. Patient has non-hodgkin's and gets chemo.

## 2020-04-22 ENCOUNTER — Inpatient Hospital Stay (HOSPITAL_COMMUNITY): Payer: BC Managed Care – PPO

## 2020-04-22 ENCOUNTER — Inpatient Hospital Stay (HOSPITAL_BASED_OUTPATIENT_CLINIC_OR_DEPARTMENT_OTHER): Payer: BC Managed Care – PPO

## 2020-04-22 DIAGNOSIS — A419 Sepsis, unspecified organism: Secondary | ICD-10-CM

## 2020-04-22 DIAGNOSIS — J181 Lobar pneumonia, unspecified organism: Secondary | ICD-10-CM

## 2020-04-22 DIAGNOSIS — I10 Essential (primary) hypertension: Secondary | ICD-10-CM

## 2020-04-22 DIAGNOSIS — C8460 Anaplastic large cell lymphoma, ALK-positive, unspecified site: Secondary | ICD-10-CM

## 2020-04-22 DIAGNOSIS — E782 Mixed hyperlipidemia: Secondary | ICD-10-CM

## 2020-04-22 LAB — BASIC METABOLIC PANEL
Anion gap: 10 (ref 5–15)
BUN: 10 mg/dL (ref 6–20)
CO2: 25 mmol/L (ref 22–32)
Calcium: 8.6 mg/dL — ABNORMAL LOW (ref 8.9–10.3)
Chloride: 104 mmol/L (ref 98–111)
Creatinine, Ser: 0.67 mg/dL (ref 0.44–1.00)
GFR calc Af Amer: >60 mL/min (ref >60)
GFR calc non Af Amer: >60 mL/min (ref >60)
Glucose, Bld: 117 mg/dL — ABNORMAL HIGH (ref 70–99)
Potassium: 3.3 mmol/L — ABNORMAL LOW (ref 3.5–5.1)
Sodium: 139 mmol/L (ref 135–145)

## 2020-04-22 LAB — PROCALCITONIN: Procalcitonin: 0.69 ng/mL

## 2020-04-22 LAB — CBC
HCT: 30.5 % — ABNORMAL LOW (ref 36.0–46.0)
Hemoglobin: 9.6 g/dL — ABNORMAL LOW (ref 12.0–15.0)
MCH: 26.9 pg (ref 26.0–34.0)
MCHC: 31.5 g/dL (ref 30.0–36.0)
MCV: 85.4 fL (ref 80.0–100.0)
Platelets: 228 10*3/uL (ref 150–400)
RBC: 3.57 MIL/uL — ABNORMAL LOW (ref 3.87–5.11)
RDW: 17.2 % — ABNORMAL HIGH (ref 11.5–15.5)
WBC: 8.5 10*3/uL (ref 4.0–10.5)
nRBC: 0 % (ref 0.0–0.2)

## 2020-04-22 LAB — CBG MONITORING, ED
Glucose-Capillary: 100 mg/dL — ABNORMAL HIGH (ref 70–99)
Glucose-Capillary: 117 mg/dL — ABNORMAL HIGH (ref 70–99)

## 2020-04-22 LAB — GLUCOSE, CAPILLARY
Glucose-Capillary: 92 mg/dL (ref 70–99)
Glucose-Capillary: 78 mg/dL (ref 70–99)

## 2020-04-22 LAB — HEMOGLOBIN A1C
Hgb A1c MFr Bld: 7.4 % — ABNORMAL HIGH (ref 4.8–5.6)
Mean Plasma Glucose: 165.68 mg/dL

## 2020-04-22 LAB — HIV ANTIBODY (ROUTINE TESTING W REFLEX): HIV Screen 4th Generation wRfx: NONREACTIVE

## 2020-04-22 MED ORDER — POTASSIUM CHLORIDE IN NACL 20-0.9 MEQ/L-% IV SOLN
INTRAVENOUS | Status: DC
  Filled 2020-04-22 (×7): qty 1000

## 2020-04-22 MED ORDER — POTASSIUM CHLORIDE CRYS ER 20 MEQ PO TBCR
20.0000 meq | EXTENDED_RELEASE_TABLET | Freq: Once | ORAL | Status: AC
  Administered 2020-04-22: 20 meq via ORAL
  Filled 2020-04-22: qty 1

## 2020-04-22 MED ORDER — VANCOMYCIN HCL 2000 MG/400ML IV SOLN
2000.0000 mg | Freq: Once | INTRAVENOUS | Status: AC
  Administered 2020-04-22: 10:00:00 2000 mg via INTRAVENOUS
  Filled 2020-04-22: qty 400

## 2020-04-22 MED ORDER — MONTELUKAST SODIUM 10 MG PO TABS
10.0000 mg | ORAL_TABLET | Freq: Every day | ORAL | Status: DC
  Administered 2020-04-22 – 2020-05-03 (×12): 10 mg via ORAL
  Filled 2020-04-22 (×12): qty 1

## 2020-04-22 MED ORDER — LOSARTAN POTASSIUM 25 MG PO TABS: 25.0000 mg | ORAL_TABLET | Freq: Every day | ORAL | Status: DC

## 2020-04-22 MED ORDER — METOPROLOL TARTRATE 25 MG PO TABS
25.0000 mg | ORAL_TABLET | Freq: Two times a day (BID) | ORAL | Status: DC
  Administered 2020-04-22 – 2020-05-04 (×25): 25 mg via ORAL
  Filled 2020-04-22 (×25): qty 1

## 2020-04-22 MED ORDER — SODIUM CHLORIDE 0.9 % IV SOLN: INTRAVENOUS | Status: DC | PRN

## 2020-04-22 MED ORDER — INSULIN ASPART 100 UNIT/ML ~~LOC~~ SOLN
0.0000 [IU] | Freq: Three times a day (TID) | SUBCUTANEOUS | Status: DC
  Administered 2020-04-23 – 2020-05-01 (×3): 1 [IU] via SUBCUTANEOUS
  Administered 2020-05-02: 2 [IU] via SUBCUTANEOUS
  Administered 2020-05-02: 9 [IU] via SUBCUTANEOUS
  Administered 2020-05-02: 5 [IU] via SUBCUTANEOUS
  Administered 2020-05-03 – 2020-05-04 (×2): 9 [IU] via SUBCUTANEOUS
  Administered 2020-05-04: 2 [IU] via SUBCUTANEOUS
  Administered 2020-05-04: 3 [IU] via SUBCUTANEOUS

## 2020-04-22 MED ORDER — CHLORHEXIDINE GLUCONATE CLOTH 2 % EX PADS
6.0000 | MEDICATED_PAD | Freq: Every day | CUTANEOUS | Status: DC
  Administered 2020-04-23 – 2020-05-03 (×11): 6 via TOPICAL

## 2020-04-22 MED ORDER — PROCHLORPERAZINE MALEATE 10 MG PO TABS
10.0000 mg | ORAL_TABLET | Freq: Four times a day (QID) | ORAL | Status: DC | PRN
  Administered 2020-04-27: 10 mg via ORAL
  Filled 2020-04-22 (×2): qty 1

## 2020-04-22 MED ORDER — ROSUVASTATIN CALCIUM 5 MG PO TABS
10.0000 mg | ORAL_TABLET | Freq: Every day | ORAL | Status: DC
  Administered 2020-04-22 – 2020-05-04 (×13): 10 mg via ORAL
  Filled 2020-04-22 (×4): qty 2
  Filled 2020-04-22 (×2): qty 1
  Filled 2020-04-22: qty 2
  Filled 2020-04-22: qty 1
  Filled 2020-04-22 (×6): qty 2

## 2020-04-22 MED ORDER — VANCOMYCIN HCL IN DEXTROSE 1-5 GM/200ML-% IV SOLN
1000.0000 mg | Freq: Two times a day (BID) | INTRAVENOUS | Status: DC
  Administered 2020-04-22 – 2020-04-25 (×7): 1000 mg via INTRAVENOUS
  Filled 2020-04-22 (×8): qty 200

## 2020-04-22 MED ORDER — ALBUTEROL SULFATE HFA 108 (90 BASE) MCG/ACT IN AERS: 2.0000 | INHALATION_SPRAY | RESPIRATORY_TRACT | Status: DC | PRN

## 2020-04-22 MED ORDER — INSULIN ASPART 100 UNIT/ML ~~LOC~~ SOLN
0.0000 [IU] | Freq: Every day | SUBCUTANEOUS | Status: DC
  Administered 2020-05-02: 3 [IU] via SUBCUTANEOUS
  Administered 2020-05-03: 4 [IU] via SUBCUTANEOUS

## 2020-04-22 MED ORDER — AMLODIPINE BESYLATE 5 MG PO TABS: 2.5000 mg | ORAL_TABLET | Freq: Every day | ORAL | Status: DC

## 2020-04-22 MED ORDER — SODIUM CHLORIDE 0.9 % IV SOLN
2.0000 g | Freq: Three times a day (TID) | INTRAVENOUS | Status: DC
  Administered 2020-04-22 – 2020-04-26 (×13): 2 g via INTRAVENOUS
  Filled 2020-04-22 (×13): qty 2

## 2020-04-22 NOTE — ED Notes (Addendum)
Pts breakfast has arrived, placed on their table and sat them up to eat breakfast.

## 2020-04-22 NOTE — Plan of Care (Signed)
Pt arrived to floor with low blood sugar, treated with 15 gm CHO with normal blood sugar after treatment. Pt arrived to floor with temp > 101. Medicated per order.  Problem: Education: Goal: Knowledge of General Education information will improve Description: Including pain rating scale, medication(s)/side effects and non-pharmacologic comfort measures Outcome: Progressing   Problem: Health Behavior/Discharge Planning: Goal: Ability to manage health-related needs will improve Outcome: Progressing   Problem: Clinical Measurements: Goal: Ability to maintain clinical measurements within normal limits will improve Outcome: Progressing Goal: Diagnostic test results will improve Outcome: Progressing Goal: Respiratory complications will improve Outcome: Progressing Goal: Cardiovascular complication will be avoided Outcome: Progressing   Problem: Activity: Goal: Risk for activity intolerance will decrease Outcome: Progressing   Problem: Nutrition: Goal: Adequate nutrition will be maintained Outcome: Progressing   Problem: Coping: Goal: Level of anxiety will decrease Outcome: Progressing   Problem: Elimination: Goal: Will not experience complications related to bowel motility Outcome: Progressing Goal: Will not experience complications related to urinary retention Outcome: Progressing   Problem: Pain Managment: Goal: General experience of comfort will improve Outcome: Progressing   Problem: Safety: Goal: Ability to remain free from injury will improve Outcome: Progressing   Problem: Skin Integrity: Goal: Risk for impaired skin integrity will decrease Outcome: Progressing

## 2020-04-22 NOTE — H&P (Signed)
History and Physical    Megan Mcknight YWV:[id number] DOB: 1961-12-12 DOA: 04/21/2020  PCP: Rosita Fire, MD (Confirm with patient/family/NH records and if not entered, this has to be entered at [facility] point of entry) Patient coming from: Home  I have personally briefly reviewed patient's old medical records in [place]  Chief Complaint: Intractable back pain and cough  HPI: Megan Mcknight is a 59 y.o. female with medical history significant of hypertension, hyperlipidemia, diabetes mellitus, non-Hodgkin lymphoma currently on chemotherapy, asthma and atrial fibrillation not on any anticoagulation presented to ED for severe back pain.  Patient states that the pain is located in right lower back region and it radiates to right lower extremity, getting worse with activity and mildly improved with Tylenol.  Patient further mentioned that the back pain started after she was doing yard work about 2 weeks ago.  Patient denies any numbness tingling sensation and weakness in lower extremities and also denies losing control over urination and defecation.  Patient further mentioned that she is also coughing for the last few days and cough has now become productive but she thinks that it is secondary to allergies.  Patient otherwise denies fever, chills, chest pain, shortness of breath, nausea, vomiting, abdominal pain and urinary symptoms.  ED Course: Level the ED patient had fever of 102.9, blood pressure 135/84, heart rate 115, respiratory rate 28 and oxygen saturation 95% on room air.  Showed WBC 9.5, hemoglobin 10.2, sodium 137, potassium 2.9, BUN 10, creatinine 0.8, blood glucose 105.  Chest x-ray showed bilateral interstitial and airspace opacities.  X-ray lumbar spine was negative for acute fracture.  Patient was 1 dose of fentanyl injection, Tylenol and IV fluids with IV ceftriaxone and azithromycin in the ED.  Review of Systems: As per HPI otherwise 10 point review of systems negative.   Unacceptable ROS statements: "10 systems reviewed," "Extensive" (without elaboration).  Acceptable ROS statements: "All others negative," "All others reviewed and are negative," and "All others unremarkable," with at [place] documented Can't double dip - if using for HPI can't use for ROS  Past Medical History:  Diagnosis Date  . Acute bronchitis   . Anaplastic large cell lymphoma 2014   remission since 2015  . Asthma   . Cancer ([place])    lung and kidney  . Colon polyps 02/2013   Per colonoscopy  . Coma ([place])   . Diabetes mellitus without complication ([place])   . Diverticulosis 02/2013   Per colonoscopy  . Dysrhythmia   . Former light tobacco smoker   . GERD (gastroesophageal reflux disease)   . H/O stem cell transplant ([place]) 2015  . Hyperlipidemia   . Nerve pain    Left leg  . Non Hodgkin's lymphoma ([place])   . Seasonal allergies     Past Surgical History:  Procedure Laterality Date  . bones removed from Baby toes    . COLONOSCOPY  02/16/2012   Procedure: COLONOSCOPY;  Surgeon: Daneil Dolin, MD;  [facility];  Service: Endoscopy;  Laterality: N/A;  1:30  . COLONOSCOPY N/A 03/26/2017   Procedure: COLONOSCOPY;  Surgeon: Daneil Dolin, MD;  [facility];  Service: Endoscopy;  Laterality: N/A;  930  . IR IMAGING GUIDED PORT INSERTION  12/19/2019  . MEDIASTINOSCOPY N/A 05/13/2013   Procedure: MEDIASTINOSCOPY;  Surgeon: Melrose Nakayama, MD;  Location: [facility];  Service: Thoracic;  Laterality: N/A;  . PARTIAL HYSTERECTOMY    . VAGINAL HYSTERECTOMY  partial hyst   . VIDEO BRONCHOSCOPY WITH ENDOBRONCHIAL ULTRASOUND N/A 05/13/2013   Procedure: VIDEO BRONCHOSCOPY WITH ENDOBRONCHIAL ULTRASOUND;  Surgeon: Melrose Nakayama, MD;  Location: [facility];  Service: Thoracic;  Laterality: N/A;     reports that she quit smoking about 7 years ago. Her smoking use included cigarettes. She smoked 0.50 packs per day. She has never used smokeless tobacco. She reports  that she does not drink alcohol or use drugs.  Allergies  Allergen Reactions  . Iodine Anaphylaxis    Told to avoid due to shellfish allergy. Told to avoid due to shellfish allergy.  Marland Kitchen Shellfish-Derived Products Anaphylaxis and Swelling    Eyes swelling, scratchy throat, bumps on lips.    Family History  Problem Relation Age of Onset  . Colon polyps Mother   . Hypertension Mother   . Heart disease Mother   . Hyperlipidemia Mother   . Cancer Father        prostate  . Hypertension Father   . Hyperlipidemia Father   . Heart defect Other        family history   . Asthma Other        family history    Unacceptable: Noncontributory, unremarkable, or negative. Acceptable: (example)Family history negative for heart disease  Prior to Admission medications   Medication Sig Start Date End Date Taking? Authorizing Provider  albuterol (PROVENTIL HFA;VENTOLIN HFA) 108 (90 Base) MCG/ACT inhaler Inhale 2 puffs into the lungs every 4 (four) hours as needed for wheezing or shortness of breath. 10/27/16  Yes Pollina, Gwenyth Allegra, MD  amLODipine (NORVASC) 2.5 MG tablet Take 2.5 mg by mouth daily.  12/13/19  Yes [provider]  LANTUS SOLOSTAR 100 UNIT/ML Solostar Pen Inject 70 Units into the skin at bedtime. 03/05/20  Yes Nida, Marella Chimes, MD  latanoprost (XALATAN) 0.005 % ophthalmic solution 1 drop at bedtime. 12/13/19  Yes [provider]  lidocaine-prilocaine (EMLA) cream Apply 1 application topically as needed. 12/22/19  Yes Wyatt Portela, MD  losartan (COZAAR) 25 MG tablet Take 25 mg by mouth daily.  12/13/19  Yes [provider]  metFORMIN (GLUCOPHAGE) 500 MG tablet Take by mouth 2 (two) times daily with a meal.   Yes [provider]  metoprolol tartrate (LOPRESSOR) 25 MG tablet Take 25 mg by mouth 2 (two) times daily.    Yes [provider]  montelukast (SINGULAIR) 10 MG tablet Take 10 mg by mouth at bedtime.   Yes [provider]   prochlorperazine (COMPAZINE) 10 MG tablet Take 1 tablet (10 mg total) by mouth every 6 (six) hours as needed for nausea or vomiting. 12/01/19  Yes Wyatt Portela, MD  rosuvastatin (CRESTOR) 10 MG tablet Take 10 mg by mouth daily.   Yes [provider]  ammonium lactate (AMLACTIN) 12 % cream APPLY TO AFFECTED AREA ONCE A DAY FOR 90 DAYS 03/05/20   [provider]  blood glucose meter kit and supplies KIT 1 each by Does not apply route 4 (four) times daily. Dispense based on patient and insurance preference. Use up to four times daily as directed. (FOR ICD-9 250.00, 250.01). Patient not taking: Reported on 04/21/2020 02/21/20   Cassandria Anger, MD  Blood Glucose Monitoring Suppl (ACCU-CHEK GUIDE) w/Device KIT 1 Piece by Does not apply route as directed. Patient not taking: Reported on 04/21/2020 02/20/20   Cassandria Anger, MD  glucose blood (ACCU-CHEK GUIDE) test strip Use as instructed Patient not taking: Reported on 04/21/2020 02/20/20  Cassandria Anger, MD  Insulin Pen Needle (B-D ULTRAFINE III SHORT PEN) 31G X 8 MM MISC 1 each by Does not apply route as directed. Patient not taking: Reported on 04/21/2020 02/20/20   Cassandria Anger, MD    Physical Exam: Vitals:   04/21/20 2130 04/21/20 2145 04/21/20 2200 04/22/20 0524  BP: 126/84  101/74   Pulse: 94 97 100   Resp: (!) 28 (!) 24 (!) 35   Temp: 99.3 F (37.4 C)   (!) 103.3 F (39.6 C)  TempSrc: Oral   Rectal  SpO2: 94% 96% 94%   Weight:      Height:        Constitutional: NAD, calm, comfortable Vitals:   04/21/20 2130 04/21/20 2145 04/21/20 2200 04/22/20 0524  BP: 126/84  101/74   Pulse: 94 97 100   Resp: (!) 28 (!) 24 (!) 35   Temp: 99.3 F (37.4 C)   (!) 103.3 F (39.6 C)  TempSrc: Oral   Rectal  SpO2: 94% 96% 94%   Weight:      Height:        General: Patient is a 59 year old African-American female who looks sick but not in any acute distress. Eyes: PERRL, lids and conjunctivae normal ENMT:  Mucous membranes are moist. Posterior pharynx clear of any exudate or lesions.Normal dentition.  Neck: normal, supple, no masses, no thyromegaly Respiratory: Patient is not on any oxygen at this time.  Decreased breath sounds in bilateral lower lobes.  Mild bilateral rhonchi but no wheezing present on auscultation. Normal respiratory effort. No accessory muscle use.  Cardiovascular: Regular rate and rhythm, no murmurs / rubs / gallops. No extremity edema. 2+ pedal pulses. No carotid bruits.  Abdomen: no tenderness, no masses palpated. No hepatosplenomegaly. Bowel sounds positive.  Musculoskeletal: no clubbing / cyanosis. No joint deformity upper and lower extremities. Good ROM, no contractures. Normal muscle tone.  Lower back is mildly tender on palpation. Skin: no rashes, lesions, ulcers. No induration Neurologic: CN 2-12 grossly intact. Sensation intact, DTR normal. Strength 5/5 in all 4.  Psychiatric: Normal judgment and insight. Alert and oriented x 3. Normal mood.   (Anything < 9 systems with 2 bullets each down codes to level 1) (If patient refuses exam can't bill higher level) (Make sure to document decubitus ulcers present on admission -- if possible -- and whether patient has chronic indwelling catheter at time of admission)  Labs on Admission: I have personally reviewed following labs and imaging studies  CBC: Recent Labs  Lab 04/21/20 1935  WBC 9.5  NEUTROABS 6.4  HGB 10.2*  HCT 32.7*  MCV 85.6  PLT 005   Basic Metabolic Panel: Recent Labs  Lab 04/21/20 1935  NA 137  K 2.9*  CL 100  CO2 27  GLUCOSE 105*  BUN 10  CREATININE 0.87  CALCIUM 8.8*   GFR: Estimated Creatinine Clearance: 87.1 mL/min (by C-G formula based on SCr of 0.87 mg/dL). Liver Function Tests: Recent Labs  Lab 04/21/20 1935  AST 36  ALT 31  ALKPHOS 57  BILITOT 0.8  PROT 7.5  ALBUMIN 2.9*   No results for input(s): LIPASE, AMYLASE in the last 168 hours. No results for input(s): AMMONIA in  the last 168 hours. Coagulation Profile: Recent Labs  Lab 04/21/20 1935  INR 1.3*   Cardiac Enzymes: No results for input(s): CKTOTAL, CKMB, CKMBINDEX, TROPONINI in the last 168 hours. BNP (last 3 results) No results for input(s): PROBNP in the last 8760 hours. HbA1C:  No results for input(s): HGBA1C in the last 72 hours. CBG: No results for input(s): GLUCAP in the last 168 hours. Lipid Profile: No results for input(s): CHOL, HDL, LDLCALC, TRIG, CHOLHDL, LDLDIRECT in the last 72 hours. Thyroid Function Tests: No results for input(s): TSH, T4TOTAL, FREET4, T3FREE, THYROIDAB in the last 72 hours. Anemia Panel: No results for input(s): VITAMINB12, FOLATE, FERRITIN, TIBC, IRON, RETICCTPCT in the last 72 hours. Urine analysis:    Component Value Date/Time   COLORURINE YELLOW 04/21/2020 1844   APPEARANCEUR HAZY (A) 04/21/2020 1844   LABSPEC 1.012 04/21/2020 1844   PHURINE 6.0 04/21/2020 1844   GLUCOSEU NEGATIVE 04/21/2020 1844   HGBUR MODERATE (A) 04/21/2020 1844   BILIRUBINUR NEGATIVE 04/21/2020 1844   KETONESUR NEGATIVE 04/21/2020 1844   PROTEINUR 100 (A) 04/21/2020 1844   UROBILINOGEN 0.2 08/26/2013 0814   NITRITE NEGATIVE 04/21/2020 1844   LEUKOCYTESUR TRACE (A) 04/21/2020 1844    Radiological Exams on Admission: DG Lumbar Spine Complete  Result Date: 04/21/2020 CLINICAL DATA:  Back pain. EXAM: LUMBAR SPINE - COMPLETE 4+ VIEW COMPARISON:  June 16, 2018 FINDINGS: There is no evidence of lumbar spine fracture. Alignment is normal. There mild disc height loss at the L5-S1 level. IMPRESSION: Negative. Electronically Signed   By: Constance Holster M.D.   On: 04/21/2020 20:45   DG Chest Port 1 View  Result Date: 04/21/2020 CLINICAL DATA:  Fever EXAM: PORTABLE CHEST 1 VIEW COMPARISON:  Chest radiograph dated 06/16/2018 FINDINGS: The heart size is within normal limits. Vascular calcifications are seen in the aortic arch. Mild-to-moderate patchy diffuse bilateral interstitial and  airspace opacities are seen. There is no pleural effusion or pneumothorax. The visualized skeletal structures are unremarkable. A left internal jugular central venous port catheter tip overlies the superior vena cava. IMPRESSION: Mild-to-moderate patchy diffuse bilateral interstitial and airspace opacities may reflect pneumonia and/or pulmonary edema. Aortic Atherosclerosis (ICD10-I70.0). Electronically Signed   By: Zerita Boers M.D.   On: 04/21/2020 19:32      Assessment/Plan Principal Problem:   Sepsis due to pneumonia [facility]) Patient is meeting the sepsis criteria secondary to fever, tachycardia, tachypnea and pneumonia. Patient was given IV fluids according to the sepsis protocol to ED. Continue IV normal saline at the rate of 100 mL/h. Continue IV ceftriaxone and azithromycin. Blood cultures ordered DuoNeb as needed for shortness of breath ordered.  Active Problems: Lower back pain IV morphine 2 mg every 4 hours as needed for severe pain ordered. X-ray lumbar spine was negative for acute fracture or dislocation. Continue to monitor  Hypokalemia Potassium repletion done in the ED.  We will continue to monitor potassium level and replete potassium as needed.    Normocytic anemia Patient has history of normocytic anemia and hemoglobin is at baseline.    Lymphoma ([place]) Continue chemotherapy as recommended by outpatient oncology    Type 2 diabetes mellitus with hyperglycemia, without long-term current use of insulin (HCC) Low-dose sliding scale insulin ordered. Blood glucose monitoring and hypoglycemic protocol in place.    Essential hypertension, benign Blood pressure is stable Home losartan and metoprolol ordered   Atrial fibrillation Patient most probably have paroxysmal atrial fibrillation because EKG showed normal sinus rhythm.  Patient is not on any anticoagulation for unknown reason Continue home metoprolol.     Mixed hyperlipidemia Continue home Crestor 10 mg  daily  (please populate well all problems here in Problem List. (For example, if patient is on BP meds at home and you resume or decide to hold them, it  is a problem that needs to be her. Same for CAD, COPD, HLD and so on)     DVT prophylaxis: Lovenox Code Status: Full code Family Communication: No family member present at bedside Disposition Plan: Patient will be discharged to home Consults called: None Admission status:/Telemetry/inpatient   Edmonia Lynch MD [facility] Pager [phone]   If 7PM-7AM, please contact night-coverage [email or web address] Password   04/22/2020, 6:05 AM

## 2020-04-22 NOTE — Progress Notes (Signed)
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  Patient states that she was doing some "heavy yard work" the day after which she woke up with right lower back pain.  She states that it is worse with certain movements.  She denies any saddle anesthesia, leg weakness, bladder or bowel incontinence.  She denies any recent falls or injuries.  She denies any fevers, chills, chest pain, shortness of breath, abdominal pain, diarrhea, dysuria, hematuria.  She had one episode of nausea and vomiting 2 days prior to admission.  She has had nonproductive cough.  There is no hemoptysis.  She has had some intermittent headaches, but this has not been particularly bothering her.  Because of her back pain, the patient presented to emergency department.  She was found to have a temperature of 102.9 F initially.  As result, sepsis work-up was initiated.   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 In the emergency department, the patient was febrile up to 103.3 F with tachycardia 110-120.  She was hemodynamically stable with oxygen saturation 96-100% on room air.  BMP showed a potassium 3.3, serum creatinine 0.67.  LFTs were unremarkable.  WBC 8.5, hemoglobin 9.6.  Blood cultures and urine cultures were obtained.  Lactic acid 0.8.  UA 11-20 WBC.  Chest x-ray showed bilateral patchy infiltrates.  The patient was initially started on azithromycin and ceftriaxone.  Assessment/Plan: Sepsis -Present on admission -Secondary to pneumonia and possible urinary  source -Broaden antibiotic coverage given the patient's immunocompromise status -Lactic acid picture 0.8 -Check procalcitonin -Follow blood and urine cultures -Start IV fluids -SARS-CoV2-neg  Lobar pneumonia -Given the patient's immunocompromise status, broaden antibiotic coverage -Add vancomycin and cefepime -Continue azithromycin -Urine Legionella antigen -MRSA screen  Right lower back pain -CT lumbar spine -Plan x-ray lumbar spine negative for fractures  Pyuria -Continue antibiotics pending urine culture data  Anaplastic T-cell lymphoma -Last chemotherapy 04/05/2020 -Follow-up Dr. Alen Blew  Essential hypertension -Continue metoprolol titrate -Holding amlodipine and losartan secondary to soft blood pressure  Hyperlipidemia -Continue Crestor  Uncontrolled diabetes mellitus type 2 with hyperglycemia -02/20/2020 hemoglobin A1c 9.9 -NovoLog sliding scale  Hypokalemia -Replete -Check magnesium -Holding metformin         Disposition Plan: Patient From: Home D/C Place: Home- 2-3  Days Barriers: Not Clinically Stable--remains febrile with sepsis physiology  Family Communication:   No Family at bedside  Consultants:  none  Code Status:  FULL   DVT Prophylaxis:   Lovenox   Procedures: As Listed in Progress Note Above  Antibiotics: vanco 5/9>>> Cefepime 5/9>>> azithro 5/8>>>   The patient is critically ill with multiple organ systems failure and requires high complexity decision making for assessment and support, frequent evaluation and titration of therapies, application of advanced monitoring technologies and extensive interpretation of multiple databases.  Critical care time - 35 mins.     Subjective: Patient complains of right-sided low back pain.  She has a nonproductive cough.  She denies any headache, visual disturbance, chest pain, shortness of breath, nausea, vomiting, diarrhea, abdominal pain, dysuria, hematuria.  There is no  leg pain or leg  weakness.  Objective: Vitals:   04/21/20 2130 04/21/20 2145 04/21/20 2200 04/22/20 0524  BP: 126/84  101/74   Pulse: 94 97 100   Resp: (!) 28 (!) 24 (!) 35   Temp: 99.3 F (37.4 C)   (!) 103.3 F (39.6 C)  TempSrc: Oral   Rectal  SpO2: 94% 96% 94%   Weight:      Height:       No intake or output data in the 24 hours ending 04/22/20 0735 Weight change:  Exam:   General:  Pt is alert, follows commands appropriately, not in acute distress  HEENT: No icterus, No thrush, No neck mass, [place]/AT  Cardiovascular: RRR, S1/S2, no rubs, no gallops  Respiratory: Bibasilar rales.  No wheezing.  Good air movement.  Abdomen: Soft/+BS, non tender, non distended, no guarding  Extremities: No edema, No lymphangitis, No petechiae, No rashes, no synovitis  Neuro:  CN II-XII intact, strength 4/5 in RUE, RLE, strength 4/5 LUE, LLE; sensation intact bilateral; no dysmetria; babinski equivocal     Data Reviewed: I have personally reviewed following labs and imaging studies Basic Metabolic Panel: Recent Labs  Lab 04/21/20 1935 04/22/20 0508  NA 137 139  K 2.9* 3.3*  CL 100 104  CO2 27 25  GLUCOSE 105* 117*  BUN 10 10  CREATININE 0.87 0.67  CALCIUM 8.8* 8.6*   Liver Function Tests: Recent Labs  Lab 04/21/20 1935  AST 36  ALT 31  ALKPHOS 57  BILITOT 0.8  PROT 7.5  ALBUMIN 2.9*   No results for input(s): LIPASE, AMYLASE in the last 168 hours. No results for input(s): AMMONIA in the last 168 hours. Coagulation Profile: Recent Labs  Lab 04/21/20 1935  INR 1.3*   CBC: Recent Labs  Lab 04/21/20 1935 04/22/20 0508  WBC 9.5 8.5  NEUTROABS 6.4  --   HGB 10.2* 9.6*  HCT 32.7* 30.5*  MCV 85.6 85.4  PLT 238 228   Cardiac Enzymes: No results for input(s): CKTOTAL, CKMB, CKMBINDEX, TROPONINI in the last 168 hours. BNP: Invalid input(s): POCBNP CBG: No results for input(s): GLUCAP in the last 168 hours. HbA1C: No results for input(s): HGBA1C in the last 72  hours. Urine analysis:    Component Value Date/Time   COLORURINE YELLOW 04/21/2020 1844   APPEARANCEUR HAZY (A) 04/21/2020 1844   LABSPEC 1.012 04/21/2020 1844   PHURINE 6.0 04/21/2020 1844   GLUCOSEU NEGATIVE 04/21/2020 1844   HGBUR MODERATE (A) 04/21/2020 1844   BILIRUBINUR NEGATIVE 04/21/2020 [place] 04/21/2020 1844   PROTEINUR 100 (A) 04/21/2020 1844   UROBILINOGEN 0.2 08/26/2013 0814   NITRITE NEGATIVE 04/21/2020 1844   LEUKOCYTESUR TRACE (A) 04/21/2020 1844   Sepsis Labs: @LABRCNTIP (procalcitonin:4,lacticidven:4) ) Recent Results (from the past 240 hour(s))  Blood Culture (routine x 2)     Status: None (Preliminary result)   Collection Time: 04/21/20  7:35 PM   Specimen: Right Antecubital; Blood  Result Value Ref Range Status   Specimen Description RIGHT ANTECUBITAL  Final   Special Requests   Final    BOTTLES DRAWN AEROBIC AND ANAEROBIC Blood Culture adequate volume   Culture   Final    NO GROWTH < 12 HOURS Performed at [facility], [address], [place], [place] [address]    Report Status PENDING  Incomplete  Blood Culture (routine x 2)     Status: None (Preliminary result)   Collection Time: 04/21/20  8:05 PM   Specimen: Glori Luis  Cath; Blood  Result Value Ref Range Status   Specimen Description PORTA CATH  Final   Special Requests   Final    BOTTLES DRAWN AEROBIC AND ANAEROBIC Blood Culture adequate volume   Culture   Final    NO GROWTH < 12 HOURS Performed at [facility], [address], [place], [place] [address]    Report Status PENDING  Incomplete  Respiratory Panel by RT PCR (Flu A&B, Covid) - Nasopharyngeal Swab     Status: None   Collection Time: 04/21/20  9:18 PM   Specimen: Nasopharyngeal Swab  Result Value Ref Range Status   SARS Coronavirus 2 by RT PCR NEGATIVE NEGATIVE Final    Comment: (NOTE) SARS-CoV-2 target nucleic acids are NOT DETECTED. The SARS-CoV-2 RNA is generally detectable in upper respiratoy specimens during the  acute phase of infection. The lowest concentration of SARS-CoV-2 viral copies this assay can detect is 131 copies/mL. A negative result does not preclude SARS-Cov-2 infection and should not be used as the sole basis for treatment or other patient management decisions. A negative result may occur with  improper specimen collection/handling, submission of specimen other than nasopharyngeal swab, presence of viral mutation(s) within the areas targeted by this assay, and inadequate number of viral copies (<131 copies/mL). A negative result must be combined with clinical observations, patient history, and epidemiological information. The expected result is Negative. Fact Sheet for Patients:  PinkCheek.be Fact Sheet for Healthcare Providers:  GravelBags.it This test is not yet ap proved or cleared by the [place] FDA and  has been authorized for detection and/or diagnosis of SARS-CoV-2 by FDA under an Emergency Use Authorization (EUA). This EUA will remain  in effect (meaning this test can be used) for the duration of the COVID-19 declaration under Section 564(b)(1) of the Act, 21 U.S.C. section 360bbb-3(b)(1), unless the authorization is terminated or revoked sooner.    Influenza A by PCR NEGATIVE NEGATIVE Final   Influenza B by PCR NEGATIVE NEGATIVE Final    Comment: (NOTE) The Xpert Xpress SARS-CoV-2/FLU/RSV assay is intended as an aid in  the diagnosis of influenza from Nasopharyngeal swab specimens and  should not be used as a sole basis for treatment. Nasal washings and  aspirates are unacceptable for Xpert Xpress SARS-CoV-2/FLU/RSV  testing. Fact Sheet for Patients: PinkCheek.be Fact Sheet for Healthcare Providers: GravelBags.it This test is not yet approved or cleared by the [place] FDA and  has been authorized for detection and/or diagnosis of SARS-CoV-2  by  FDA under an Emergency Use Authorization (EUA). This EUA will remain  in effect (meaning this test can be used) for the duration of the  Covid-19 declaration under Section 564(b)(1) of the Act, 21  U.S.C. section 360bbb-3(b)(1), unless the authorization is  terminated or revoked. Performed at [facility], [address], [place], [place] [address]      Scheduled Meds: . azithromycin  500 mg Oral Daily  . enoxaparin (LOVENOX) injection  40 mg Subcutaneous QHS  . insulin aspart  0-5 Units Subcutaneous QHS  . insulin aspart  0-9 Units Subcutaneous TID WC  . metoprolol tartrate  25 mg Oral BID  . montelukast  10 mg Oral QHS  . rosuvastatin  10 mg Oral Daily   Continuous Infusions: . sodium chloride    . azithromycin Stopped (04/21/20 2245)  . ceFEPime (MAXIPIME) IV      Procedures/Studies: DG Lumbar Spine Complete  Result Date: 04/21/2020 CLINICAL DATA:  Back pain. EXAM: LUMBAR SPINE - COMPLETE 4+  VIEW COMPARISON:  June 16, 2018 FINDINGS: There is no evidence of lumbar spine fracture. Alignment is normal. There mild disc height loss at the L5-S1 level. IMPRESSION: Negative. Electronically Signed   By: Constance Holster M.D.   On: 04/21/2020 20:45   DG Chest Port 1 View  Result Date: 04/21/2020 CLINICAL DATA:  Fever EXAM: PORTABLE CHEST 1 VIEW COMPARISON:  Chest radiograph dated 06/16/2018 FINDINGS: The heart size is within normal limits. Vascular calcifications are seen in the aortic arch. Mild-to-moderate patchy diffuse bilateral interstitial and airspace opacities are seen. There is no pleural effusion or pneumothorax. The visualized skeletal structures are unremarkable. A left internal jugular central venous port catheter tip overlies the superior vena cava. IMPRESSION: Mild-to-moderate patchy diffuse bilateral interstitial and airspace opacities may reflect pneumonia and/or pulmonary edema. Aortic Atherosclerosis (ICD10-I70.0). Electronically Signed   By: Zerita Boers M.D.   On:  04/21/2020 19:32    Orson Eva, DO  [facility]  If 7PM-7AM, please contact night-coverage [email or web address] Password [email or web address] 04/22/2020, 7:35 AM   LOS: 1 day

## 2020-04-22 NOTE — Progress Notes (Signed)
Pharmacy Antibiotic Note  Megan Mcknight is a 59 y.o. female admitted on 04/21/2020 with sepsis secondary to pneumonia and possible urinary source.  Pharmacy has been consulted for Vancomycin and Cefepime dosing.  Plan: Vancomycin 2000 mg IV x 1 dose. Vancomycin 1000 mg IV every 12 hours. Expected AUC 505. Cefepime 2000 mg IV every 8 hours. Monitor labs, c/s, and vanco level as indicated.  Height: 5\' 8"  (172.7 cm) Weight: 99.8 kg (220 lb) IBW/kg (Calculated) : 63.9  Temp (24hrs), Avg:101.8 F (38.8 C), Min:99.3 F (37.4 C), Max:103.3 F (39.6 C)  Recent Labs  Lab 04/21/20 1935 04/21/20 2135 04/22/20 0508  WBC 9.5  --  8.5  CREATININE 0.87  --  0.67  LATICACIDVEN 0.8 0.8  --     Estimated Creatinine Clearance: 94.7 mL/min (by C-G formula based on SCr of 0.67 mg/dL).    Allergies  Allergen Reactions  . Iodine Anaphylaxis    Told to avoid due to shellfish allergy. Told to avoid due to shellfish allergy.  Marland Kitchen Shellfish-Derived Products Anaphylaxis and Swelling    Eyes swelling, scratchy throat, bumps on lips.    Antimicrobials this admission: Vanco 5/9 >>  Cefepime 5/9 >>  Azith 5/9 >>  Dose adjustments this admission: N/A  Microbiology results: 5/8 BCx: pending 5/8 UCx: pending  5/8 MRSA PCR: pending  Thank you for allowing pharmacy to be a part of this patient's care.  Margot Ables, PharmD Clinical Pharmacist 04/22/2020 11:16 AM

## 2020-04-22 NOTE — Progress Notes (Signed)
Pt arrived to floor with blood sugar reading of 60mg /dl. Treated with 15 grams carbohydrates, blood sugar up to 90's after treatment. Pt with temp > 101 upon arrival to floor, HR 103-110 ST, resp 24/min, occasional non-productive cough noted. Pt medicated with tylenol for fever per order. Scheduled abx administered. Pt with chills, but states feels better than she did on arrival to ED yesterday. Denies need for pain medication, states right lower back is "sore" but not bad. No distress noted, skin warm and dry. Cap refill brisk. MD notified of elevated MEWS score per protocol. No new orders received at this time.

## 2020-04-23 DIAGNOSIS — R509 Fever, unspecified: Secondary | ICD-10-CM

## 2020-04-23 DIAGNOSIS — R918 Other nonspecific abnormal finding of lung field: Secondary | ICD-10-CM

## 2020-04-23 DIAGNOSIS — C8100 Nodular lymphocyte predominant Hodgkin lymphoma, unspecified site: Secondary | ICD-10-CM

## 2020-04-23 LAB — GLUCOSE, CAPILLARY
Glucose-Capillary: 68 mg/dL — ABNORMAL LOW (ref 70–99)
Glucose-Capillary: 122 mg/dL — ABNORMAL HIGH (ref 70–99)
Glucose-Capillary: 53 mg/dL — ABNORMAL LOW (ref 70–99)
Glucose-Capillary: 85 mg/dL (ref 70–99)
Glucose-Capillary: 106 mg/dL — ABNORMAL HIGH (ref 70–99)
Glucose-Capillary: 98 mg/dL (ref 70–99)

## 2020-04-23 LAB — URINE CULTURE

## 2020-04-23 LAB — COMPREHENSIVE METABOLIC PANEL
ALT: 79 U/L — ABNORMAL HIGH (ref 0–44)
AST: 81 U/L — ABNORMAL HIGH (ref 15–41)
Albumin: 2.4 g/dL — ABNORMAL LOW (ref 3.5–5.0)
Alkaline Phosphatase: 50 U/L (ref 38–126)
Anion gap: 10 (ref 5–15)
BUN: 10 mg/dL (ref 6–20)
CO2: 22 mmol/L (ref 22–32)
Calcium: 8.4 mg/dL — ABNORMAL LOW (ref 8.9–10.3)
Chloride: 105 mmol/L (ref 98–111)
Creatinine, Ser: 0.7 mg/dL (ref 0.44–1.00)
GFR calc Af Amer: >60 mL/min (ref >60)
GFR calc non Af Amer: >60 mL/min (ref >60)
Glucose, Bld: 119 mg/dL — ABNORMAL HIGH (ref 70–99)
Potassium: 3.6 mmol/L (ref 3.5–5.1)
Sodium: 137 mmol/L (ref 135–145)
Total Bilirubin: 0.7 mg/dL (ref 0.3–1.2)
Total Protein: 6.6 g/dL (ref 6.5–8.1)

## 2020-04-23 LAB — CBC
HCT: 29.5 % — ABNORMAL LOW (ref 36.0–46.0)
Hemoglobin: 9.3 g/dL — ABNORMAL LOW (ref 12.0–15.0)
MCH: 26.8 pg (ref 26.0–34.0)
MCHC: 31.5 g/dL (ref 30.0–36.0)
MCV: 85 fL (ref 80.0–100.0)
Platelets: 200 10*3/uL (ref 150–400)
RBC: 3.47 MIL/uL — ABNORMAL LOW (ref 3.87–5.11)
RDW: 17.2 % — ABNORMAL HIGH (ref 11.5–15.5)
WBC: 7.4 10*3/uL (ref 4.0–10.5)
nRBC: 0 % (ref 0.0–0.2)

## 2020-04-23 LAB — PROCALCITONIN: Procalcitonin: 0.38 ng/mL

## 2020-04-23 LAB — MAGNESIUM: Magnesium: 1.6 mg/dL — ABNORMAL LOW (ref 1.7–2.4)

## 2020-04-23 MED ORDER — MAGNESIUM SULFATE 2 GM/50ML IV SOLN
2.0000 g | Freq: Once | INTRAVENOUS | Status: AC
  Administered 2020-04-23: 2 g via INTRAVENOUS
  Filled 2020-04-23: qty 50

## 2020-04-23 NOTE — Progress Notes (Signed)
Notified by nursing staff regarding MEWS 6 due to fever 103.50F. Pt denies CP, SOB. Alert, oriented x4. Provider notified by staff. Tylenol for fever. Recommend following red MEWS guidelines.

## 2020-04-23 NOTE — Progress Notes (Signed)
Hypoglycemic Event  CBG: 53  Treatment: 8 oz juice/soda  Symptoms: None  Follow-up CBG: Time:1207 CBG Result:85  Possible Reasons for Event: Unknown  Comments/MD notified: Dr Carles Collet    Zenaida Deed

## 2020-04-23 NOTE — Progress Notes (Signed)
Inpatient Diabetes Program Recommendations  AACE/ADA: New Consensus Statement on Inpatient Glycemic Control (2015)  Target Ranges:  Prepandial:   less than 140 mg/dL      Peak postprandial:   less than 180 mg/dL (1-2 hours)      Critically ill patients:  140 - 180 mg/dL   Lab Results  Component Value Date   GLUCAP 122 (H) 04/23/2020   HGBA1C 7.4 (H) 04/22/2020    Review of Glycemic Control Results for Megan Mcknight, Megan Mcknight (MRN [id number]) as of 04/23/2020 11:20  Ref. Range 04/22/2020 12:37 04/22/2020 16:41 04/22/2020 17:10 04/22/2020 22:00 04/23/2020 07:47  Glucose-Capillary Latest Ref Range: 70 - 99 mg/dL 117 (H) 68 (L) 92 78 122 (H)   Diabetes history: DM 2 Outpatient Diabetes medications:  Lantus 70 units q HS, Metformin 500 mg bid Current orders for Inpatient glycemic control:  Novolog sensitive tid with meals and HS Inpatient Diabetes Program Recommendations:     Blood sugar low on 5/9 possibly from home basal insulin?? A1C is improved.  Will follow.  May need reduction to very sensitive Novolog correction?    Thanks,  Adah Perl, RN, BC-ADM Inpatient Diabetes Coordinator Pager [phone] (8a-5p)

## 2020-04-23 NOTE — Progress Notes (Addendum)
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  Patient states that she was doing some "heavy yard work" the day after which she woke up with right lower back pain.  She states that it is worse with certain movements.  She denies any saddle anesthesia, leg weakness, bladder or bowel incontinence.  She denies any recent falls or injuries.  She denies any fevers, chills, chest pain, shortness of breath, abdominal pain, diarrhea, dysuria, hematuria.  She had one episode of nausea and vomiting 2 days prior to admission.  She has had nonproductive cough.  There is no hemoptysis.  She has had some intermittent headaches, but this has not been particularly bothering her.  Because of her back pain, the patient presented to emergency department.  She was found to have a temperature of 102.9 F initially.  As result, sepsis work-up was initiated.  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 In the emergency department, the patient was febrile up to 103.3 F with tachycardia 110-120.  She was hemodynamically stable with oxygen saturation 96-100% on room air.  BMP showed a potassium 3.3, serum creatinine 0.67.  LFTs were unremarkable.  WBC 8.5, hemoglobin 9.6.  Blood cultures and urine cultures were obtained.  Lactic acid 0.8.  UA 11-20 WBC.  Chest x-ray showed bilateral patchy infiltrates.  The patient was initially started on azithromycin and ceftriaxone.  She was started on vanco and cefepime with azithromycin.  She continued to have fevers up to 103.   CT chest showed new innumerable pulmonary nodules.  Pulmonary was consulted for bronchoscopy.  Due to logistical reasons, pt had to be transferred to [facility] for bronchoscopy.  Assessment/Plan: Sepsis -Present on admission -Secondary to pneumonia and possible urinary source -Broaden antibiotic coverage given the patient's immunocompromise status -Lactic acid picture 0.8 -Check procalcitonin 0.69>>>0.38 -Follow blood and urine cultures -Continue IV fluids -SARS-CoV2-neg  Lobar pneumonia/pulmonary nodules -Given the patient's immunocompromise status, broaden antibiotic coverage -CT chest--new innumerable bilateral pulmonary nodules since 02/28/20 PET -concerned about possible invasive fungal infection -check fungal antibodies -check galactomannan -quantiferon -pulm consult for possible bronchoscopy -continue vancomycin and cefepime -Continue azithromycin -Urine Legionella antigen -MRSA screen  Right lower back pain -CT lumbar spine--no discitis or osteomyelitis;  L4-5 trace anterolisthesis with mod central canal narrowing -Plan x-ray lumbar spine negative for fractures  Pyuria -urine cultures unrevealing  Anaplastic T-cell lymphoma -Last chemotherapy 04/05/2020 -discussed with Dr. Kristin Bruins infection > uncontrolled neoplasm  Essential hypertension -Continue metoprolol titrate -Holding amlodipine and losartan secondary to soft blood pressure  Hyperlipidemia -Continue Crestor  Uncontrolled diabetes mellitus type 2 with hyperglycemia -02/20/2020 hemoglobin A1c 9.9 -NovoLog sliding scale -holding metformin  Hypokalemia/Hypomagnesemia -Replete          Disposition Plan: Patient From: Home D/C Place: Home- 2-3  Days Barriers: Not Clinically Stable--remains febrile with new lung nodules  Family Communication:   No Family at bedside  Consultants:  none  Code Status:  FULL   DVT Prophylaxis:  [place] Lovenox   Procedures: As Listed in  Progress Note Above  Antibiotics: vanco 5/9>>> Cefepime 5/9>>> azithro 5/8>>>  Subjective: Pt states back pain is slowly improving.  Patient denies fevers, chills, headache, chest pain, dyspnea, nausea, vomiting, diarrhea, abdominal pain, dysuria, hematuria, hematochezia, and melena.   Objective: Vitals:   04/22/20 2053 04/23/20 0600 04/23/20 0852 04/23/20 1043  BP: (!) 153/85 (!) 153/90 119/72 134/81  Pulse:  (!) 107 98 87  Resp: 20 20  19   Temp: 100.2 F (37.9 C) (!) 103 F (39.4 C)  98.9 F (37.2 C)  TempSrc: Oral     SpO2: 97% 94%  97%  Weight:      Height:        Intake/Output Summary (Last 24 hours) at 04/23/2020 1125 Last data filed at 04/23/2020 0900 Gross per 24 hour  Intake 3283.39 ml  Output --  Net 3283.39 ml   Weight change: 0.907 kg Exam:   General:  Pt is alert, follows commands appropriately, not in acute distress  HEENT: No icterus, No thrush, No neck mass, [place]/AT  Cardiovascular: RRR, S1/S2, no rubs, no gallops  Respiratory: CTA bilaterally, no wheezing, no crackles, no rhonchi  Abdomen: Soft/+BS, non tender, non distended, no guarding  Extremities: No edema, No lymphangitis, No petechiae, No rashes, no synovitis   Data Reviewed: I have personally reviewed following labs and imaging studies Basic Metabolic Panel: Recent Labs  Lab 04/21/20 1935 04/22/20 0508 04/23/20 0621  NA 137 139 137  K 2.9* 3.3* 3.6  CL 100 104 105  CO2 27 25 22   GLUCOSE 105* 117* 119*  BUN 10 10 10   CREATININE 0.87 0.67 0.70  CALCIUM 8.8* 8.6* 8.4*  MG  --   --  1.6*   Liver Function Tests: Recent Labs  Lab 04/21/20 1935 04/23/20 0621  AST 36 81*  ALT 31 79*  ALKPHOS 57 50  BILITOT 0.8 0.7  PROT 7.5 6.6  ALBUMIN 2.9* 2.4*   No results for input(s): LIPASE, AMYLASE in the last 168 hours. No results for input(s): AMMONIA in the last 168 hours. Coagulation Profile: Recent Labs  Lab 04/21/20 1935  INR 1.3*   CBC: Recent Labs  Lab  04/21/20 1935 04/22/20 0508 04/23/20 0621  WBC 9.5 8.5 7.4  NEUTROABS 6.4  --   --   HGB 10.2* 9.6* 9.3*  HCT 32.7* 30.5* 29.5*  MCV 85.6 85.4 85.0  PLT 238 228 200   Cardiac Enzymes: No results for input(s): CKTOTAL, CKMB, CKMBINDEX, TROPONINI in the last 168 hours. BNP: Invalid input(s): POCBNP CBG: Recent Labs  Lab 04/22/20 1237 04/22/20 1641 04/22/20 1710 04/22/20 2200 04/23/20 0747  GLUCAP 117* 68* 92 78 122*   HbA1C: Recent Labs    04/22/20 0507  HGBA1C 7.4*   Urine analysis:    Component Value Date/Time   COLORURINE YELLOW 04/21/2020 1844   APPEARANCEUR HAZY (A) 04/21/2020 1844   LABSPEC 1.012 04/21/2020 1844   PHURINE 6.0 04/21/2020 1844   GLUCOSEU NEGATIVE 04/21/2020 1844   HGBUR MODERATE (A) 04/21/2020 1844   BILIRUBINUR NEGATIVE 04/21/2020 [place] 04/21/2020 1844   PROTEINUR 100 (A) 04/21/2020 1844   UROBILINOGEN 0.2 08/26/2013 0814   NITRITE NEGATIVE 04/21/2020 1844   LEUKOCYTESUR TRACE (A) 04/21/2020 1844   Sepsis Labs: @LABRCNTIP (procalcitonin:4,lacticidven:4) ) Recent Results (from the past 240 hour(s))  Urine culture     Status: Abnormal   Collection Time: 04/21/20  6:44 PM   Specimen: In/Out Cath Urine  Result Value Ref Range Status   Specimen Description   Final    IN/OUT CATH URINE Performed at [facility],  [address], [place], [place] [address]    Special Requests   Final    NONE Performed at [facility], [address], [place], [place] [address]    Culture MULTIPLE SPECIES PRESENT, SUGGEST RECOLLECTION (A)  Final   Report Status 04/23/2020 FINAL  Final  Blood Culture (routine x 2)     Status: None (Preliminary result)   Collection Time: 04/21/20  7:35 PM   Specimen: Right Antecubital; Blood  Result Value Ref Range Status   Specimen Description RIGHT ANTECUBITAL  Final   Special Requests   Final    BOTTLES DRAWN AEROBIC AND ANAEROBIC Blood Culture adequate volume   Culture   Final    NO GROWTH 2  DAYS Performed at [facility], [address], [place], [place] [address]    Report Status PENDING  Incomplete  Blood Culture (routine x 2)     Status: None (Preliminary result)   Collection Time: 04/21/20  8:05 PM   Specimen: Porta Cath; Blood  Result Value Ref Range Status   Specimen Description PORTA CATH  Final   Special Requests   Final    BOTTLES DRAWN AEROBIC AND ANAEROBIC Blood Culture adequate volume   Culture   Final    NO GROWTH 2 DAYS Performed at [facility], [address], [place], [place] [address]    Report Status PENDING  Incomplete  Respiratory Panel by RT PCR (Flu A&B, Covid) - Nasopharyngeal Swab     Status: None   Collection Time: 04/21/20  9:18 PM   Specimen: Nasopharyngeal Swab  Result Value Ref Range Status   SARS Coronavirus 2 by RT PCR NEGATIVE NEGATIVE Final    Comment: (NOTE) SARS-CoV-2 target nucleic acids are NOT DETECTED. The SARS-CoV-2 RNA is generally detectable in upper respiratoy specimens during the acute phase of infection. The lowest concentration of SARS-CoV-2 viral copies this assay can detect is 131 copies/mL. A negative result does not preclude SARS-Cov-2 infection and should not be used as the sole basis for treatment or other patient management decisions. A negative result may occur with  improper specimen collection/handling, submission of specimen other than nasopharyngeal swab, presence of viral mutation(s) within the areas targeted by this assay, and inadequate number of viral copies (<131 copies/mL). A negative result must be combined with clinical observations, patient history, and epidemiological information. The expected result is Negative. Fact Sheet for Patients:  PinkCheek.be Fact Sheet for Healthcare Providers:  GravelBags.it This test is not yet ap proved or cleared by the [place] FDA and  has been authorized for detection and/or diagnosis of SARS-CoV-2  by FDA under an Emergency Use Authorization (EUA). This EUA will remain  in effect (meaning this test can be used) for the duration of the COVID-19 declaration under Section 564(b)(1) of the Act, 21 U.S.C. section 360bbb-3(b)(1), unless the authorization is terminated or revoked sooner.    Influenza A by PCR NEGATIVE NEGATIVE Final   Influenza B by PCR NEGATIVE NEGATIVE Final    Comment: (NOTE) The Xpert Xpress SARS-CoV-2/FLU/RSV assay is intended as an aid in  the diagnosis of influenza from Nasopharyngeal swab specimens and  should not be used as a sole basis for treatment. Nasal washings and  aspirates are unacceptable for Xpert Xpress SARS-CoV-2/FLU/RSV  testing. Fact Sheet for Patients: PinkCheek.be Fact Sheet for Healthcare Providers: GravelBags.it This test is not yet approved or cleared by the [place] FDA and  has been authorized for detection and/or diagnosis of SARS-CoV-2 by  FDA under an Emergency  Use Authorization (EUA). This EUA will remain  in effect (meaning this test can be used) for the duration of the  Covid-19 declaration under Section 564(b)(1) of the Act, 21  U.S.C. section 360bbb-3(b)(1), unless the authorization is  terminated or revoked. Performed at [facility], [address] [place], [place] [address]      Scheduled Meds: . azithromycin  500 mg Oral Daily  . Chlorhexidine Gluconate Cloth  6 each Topical Daily  . enoxaparin (LOVENOX) injection  40 mg Subcutaneous QHS  . insulin aspart  0-5 Units Subcutaneous QHS  . insulin aspart  0-9 Units Subcutaneous TID WC  . metoprolol tartrate  25 mg Oral BID  . montelukast  10 mg Oral QHS  . rosuvastatin  10 mg Oral Daily   Continuous Infusions: . sodium chloride    . 0.9 % NaCl with KCl 20 mEq / L 100 mL/hr at 04/23/20 0900  . ceFEPime (MAXIPIME) IV 2 g (04/23/20 0901)  . vancomycin 1,000 mg (04/22/20 2351)    Procedures/Studies: DG Lumbar  Spine Complete  Result Date: 04/21/2020 CLINICAL DATA:  Back pain. EXAM: LUMBAR SPINE - COMPLETE 4+ VIEW COMPARISON:  June 16, 2018 FINDINGS: There is no evidence of lumbar spine fracture. Alignment is normal. There mild disc height loss at the L5-S1 level. IMPRESSION: Negative. Electronically Signed   By: Constance Holster M.D.   On: 04/21/2020 20:45   CT CHEST WO CONTRAST  Result Date: 04/22/2020 CLINICAL DATA:  Back pain.  Anaplastic large T-cell lymphoma. EXAM: CT CHEST WITHOUT CONTRAST TECHNIQUE: Multidetector CT imaging of the chest was performed following the standard protocol without IV contrast. COMPARISON:  PET-CT scan 02/28/2020, CT chest 10/07/2019 FINDINGS: Cardiovascular: No significant vascular findings. Normal heart size. No pericardial effusion. Port in the anterior chest wall with tip in distal SVC. Mediastinum/Nodes: No axillary or supraclavicular adenopathy. No mediastinal hilar adenopathy. Lungs/Pleura: There are new bilateral innumerable pulmonary nodules of varying size. Size range from 2 mm to 10 mm. (Image 86/5 in the LEFT lower lobe for example). Nodules involve all the lobes of the lung. There is a solitary very large nodule in the lingula measuring 2.6 cm. These nodules are new from recent PET-CT Upper Abdomen: Limited view of the liver, kidneys, pancreas are unremarkable. Normal adrenal glands. Musculoskeletal: No aggressive osseous lesion. IMPRESSION: 1. Rapid development bilateral multinodular pulmonary metastasis. Innumerable nodules within LEFT and RIGHT lung which are new from PET-CT scan 02/28/2020. 2. No adenopathy on noncontrast exam 3. No skeletal lesion identified. Electronically Signed   By: Suzy Bouchard M.D.   On: 04/22/2020 09:08   CT LUMBAR SPINE WO CONTRAST  Result Date: 04/22/2020 CLINICAL DATA:  Fever, sepsis, low back pain; low back pain, no red flags. EXAM: CT LUMBAR SPINE WITHOUT CONTRAST TECHNIQUE: Multidetector CT imaging of the lumbar spine was performed  without intravenous contrast administration. Multiplanar CT image reconstructions were also generated. COMPARISON:  Radiographs of the lumbar spine 06/16/2018, CT abdomen/pelvis 06/14/2015 FINDINGS: Segmentation: 5 lumbar vertebrae. Alignment: Straightening of the expected lumbar lordosis. Trace L4-L5 grade 1 anterolisthesis. Vertebrae: Vertebral body height is maintained. No evidence of acute fracture. No aggressive osseous lesion. Paraspinal and other soft tissues: Aortoiliac atherosclerosis. Paraspinal soft tissues within normal limits. Disc levels: No more than mild disc space narrowing at any level. T12-L1: No appreciable significant disc herniation or stenosis. L1-L2: No appreciable significant disc herniation or stenosis. L2-L3: No appreciable significant disc herniation or stenosis. L3-L4: Small disc bulge. Mild facet arthrosis/ligamentum flavum hypertrophy. Apparent mild  spinal canal narrowing. No significant foraminal stenosis. L4-L5: Trace anterolisthesis. Disc bulge. Facet arthrosis/ligamentum flavum hypertrophy. Suspected at least moderate bilateral subarticular and central canal narrowing. Moderate bilateral neural foraminal narrowing. L5-S1: Small disc bulge with endplate spurring. Mild facet arthrosis/ligamentum flavum hypertrophy. Mild bilateral subarticular narrowing. The central canal appears patent. Mild right neural foraminal narrowing. IMPRESSION: No CT evidence of discitis/osteomyelitis within the lumbar spine. Consider contrast-enhanced MRI for further evaluation, as clinically warranted. Lumbar spondylosis as described and most notably as follows. At L4-L5, there is trace anterolisthesis. Disc bulge. Facet arthrosis/ligamentum flavum hypertrophy. Suspected at least moderate bilateral subarticular and central canal narrowing. Moderate bilateral neural foraminal narrowing. Aortoiliac atherosclerosis. Electronically Signed   By: Kellie Simmering DO   On: 04/22/2020 08:32   DG Chest Port 1  View  Result Date: 04/21/2020 CLINICAL DATA:  Fever EXAM: PORTABLE CHEST 1 VIEW COMPARISON:  Chest radiograph dated 06/16/2018 FINDINGS: The heart size is within normal limits. Vascular calcifications are seen in the aortic arch. Mild-to-moderate patchy diffuse bilateral interstitial and airspace opacities are seen. There is no pleural effusion or pneumothorax. The visualized skeletal structures are unremarkable. A left internal jugular central venous port catheter tip overlies the superior vena cava. IMPRESSION: Mild-to-moderate patchy diffuse bilateral interstitial and airspace opacities may reflect pneumonia and/or pulmonary edema. Aortic Atherosclerosis (ICD10-I70.0). Electronically Signed   By: Zerita Boers M.D.   On: 04/21/2020 19:32    Orson Eva, DO  [facility]  If 7PM-7AM, please contact night-coverage [email or web address] Password [email or web address] 04/23/2020, 11:25 AM   LOS: 2 days

## 2020-04-23 NOTE — Consult Note (Signed)
NAME:  Megan Mcknight, MRN:  [id number], DOB:  08-Sep-1961, LOS: 2 ADMISSION DATE:  04/21/2020, CONSULTATION DATE:  04/23/2020 REFERRING MD:  Dr. 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  Found to have new b/l innumerable nodules of lung on CT chest from 04/22/20.  History of present illness   59 yo female was diagnosed with lymphoma in June of 2014.  She had autologous stem cell transplant (SCT) in February 2015.  Completed 6 cycles of CHOEP.  She was found to have relapse in December 2020.  She was started on adcetris in December 2020.  Only apparent side effect was neuropathy.  She had PET scan on 02/28/20 (reviewed by me) that showed near complete response in adenopathy and no lung nodules.   She was working in her yard about 2 weeks prior to admission.  She developed back pain and presented to the ER.  She also developed a cough.  In ER she was found to have fever 102.78F and CXR showed b/l ASD.  She was started on broad spectrum antibiotics.  She continued to have fevers.  She had CT chest on 04/22/20 that showed new b/l innumerable lung nodules.    About one week prior to admission she noticed feeling more fatigued and having sweats.  She wasn't aware of having.  She though her cough was related to allergies.  She had TB testing in 2014 prior to starting therapy for lymphoma and this was negative.  She is from [place].  No recent travel or sick exposures.  Denies any animal/bird exposures.  Past Medical History  HTN, DM, [facility] Events   5/08 Admit  Consults:    Procedures:    Significant Diagnostic Tests:  CT chest 04/22/20 >> new diffuse b/l innumerable pulmonary nodules ranging in size from 2 to 10 mm  Micro Data:  SARS CoV2 PCR 5/08 >>  negative Blood 5/08 >> HIV 5/09 >> nonreactive Legionella 5/09 >>  Histoplasma Ag 5/10 >> Aspergillus Ab 5/10 >>  Quantiferon gold 5/10 >>    Antimicrobials:  Rocephin 5/08 Zithromax 5/08 >> Vancomycin 5/09 >> Cefepime 5/09 >>  Interim history/subjective:    Objective   Blood pressure 134/81, pulse 87, temperature 98.9 F (37.2 C), resp. rate 19, height _0  (1.727 m), weight 100.7 kg, SpO2 97 %.        Intake/Output Summary (Last 24 hours) at 04/23/2020 1309 Last data filed at 04/23/2020 1100 Gross per 24 hour  Intake 3283.39 ml  Output 650 ml  Net 2633.39 ml   Filed Weights   04/21/20 1819 04/22/20 1700  Weight: 99.8 kg 100.7 kg    Examination:  General - alert Eyes - pupils reactive ENT - no sinus tenderness, no stridor Cardiac - regular rate/rhythm, no murmur Chest - equal breath sounds b/l, no wheezing or rales, port in Lt upper chest w/o erythema or tenderness Abdomen - soft, non tender, + bowel sounds Extremities - no cyanosis, clubbing, or edema Skin - no rashes Neuro - normal strength, moves extremities, follows commands Psych - normal mood and behavior Lymphatics - no lymphadenopathy   Discussion:  59 yo female with new fever, cough found to have innumerable pulmonary nodules on CT chest.  She has hx of T cell lymphoma  and has been on therapy for this with adcetris.  Her PET scan from March 2021 did not show these nodules.  Assessment & Plan:   Lung nodules. - will need to arrange for bronchoscopy with BAL and transbronchial biopsy - she is familiar with this procedure - risks detailed as bleeding, infection, pneumothorax, and non diagnosis - will need to determine if procedure can be done in [facility] or if she needs to transfer to [place] for procedure - continue current ABx for now - f/u Quantiferon Gold from 5/10  Best practice:  Diet: Carb modified, heart healthy DVT prophylaxis: Lovenox GI prophylaxis: Not indicated Mobility: As  tolerated Code Status: full code  Labs:  CBC: Recent Labs  Lab 04/21/20 1935 04/22/20 0508 04/23/20 0621  WBC 9.5 8.5 7.4  NEUTROABS 6.4  --   --   HGB 10.2* 9.6* 9.3*  HCT 32.7* 30.5* 29.5*  MCV 85.6 85.4 85.0  PLT 238 228 546    Basic Metabolic Panel: Recent Labs  Lab 04/21/20 1935 04/22/20 0508 04/23/20 0621  NA 137 139 137  K 2.9* 3.3* 3.6  CL 100 104 105  CO2 _0 GLUCOSE 105* 117* 119*  BUN _1 CREATININE 0.87 0.67 0.70  CALCIUM 8.8* 8.6* 8.4*  MG  --   --  1.6*   GFR: Estimated Creatinine Clearance: 95.1 mL/min (by C-G formula based on SCr of 0.7 mg/dL). Recent Labs  Lab 04/21/20 1935 04/21/20 2135 04/22/20 0508 04/23/20 0621  PROCALCITON  --   --  0.69 0.38  WBC 9.5  --  8.5 7.4  LATICACIDVEN 0.8 0.8  --   --     Liver Function Tests: Recent Labs  Lab 04/21/20 1935 04/23/20 0621  AST 36 81*  ALT 31 79*  ALKPHOS 57 50  BILITOT 0.8 0.7  PROT 7.5 6.6  ALBUMIN 2.9* 2.4*   No results for input(s): LIPASE, AMYLASE in the last 168 hours. No results for input(s): AMMONIA in the last 168 hours.  ABG    Component Value Date/Time   PHART 7.222 (L) 08/21/2013 1405   PCO2ART 35.4 08/21/2013 1405   PO2ART 136.0 (H) 08/21/2013 1405   HCO3 14.0 (L) 08/21/2013 1405   TCO2 13.3 08/21/2013 1405   ACIDBASEDEF 12.2 (H) 08/21/2013 1405   O2SAT 98.1 08/21/2013 1405     Coagulation Profile: Recent Labs  Lab 04/21/20 1935  INR 1.3*    Cardiac Enzymes: No results for input(s): CKTOTAL, CKMB, CKMBINDEX, TROPONINI in the last 168 hours.  HbA1C: Hemoglobin A1C  Date/Time Value Ref Range Status  02/20/2020 01:49 PM 9.9 (A) 4.0 - 5.6 % Final   Hgb A1c MFr Bld  Date/Time Value Ref Range Status  04/22/2020 05:07 AM 7.4 (H) 4.8 - 5.6 % Final    Comment:    (NOTE) Pre diabetes:          5.7%-6.4% Diabetes:              >6.4% Glycemic control for   <7.0% adults with diabetes   08/23/2013 08:10 AM 12.4 (H) <5.7 % Final    Comment:     (NOTE)  According to the ADA Clinical Practice Recommendations for 2011, when HbA1c is used as a screening test:  >=6.5%   Diagnostic of Diabetes Mellitus           (if abnormal result is confirmed) 5.7-6.4%   Increased risk of developing Diabetes Mellitus References:Diagnosis and Classification of Diabetes Mellitus,Diabetes ZOXW,9604,54(UJWJX 1):S62-S69 and Standards of Medical Care in         Diabetes - 2011,Diabetes BJYN,8295,62 (Suppl 1):S11-S61.    CBG: Recent Labs  Lab 04/22/20 1710 04/22/20 2200 04/23/20 0747 04/23/20 1142 04/23/20 1206  GLUCAP 92 78 122* 53* 85    Review of Systems:   Reviewed and negative  Past Medical History  She,  has a past medical history of Acute bronchitis, Anaplastic large cell lymphoma (2014), Asthma, Cancer ([place]), Colon polyps (02/2013), Coma ([place]), Diabetes mellitus without complication ([place]), Diverticulosis (02/2013), Dysrhythmia, Former light tobacco smoker, GERD (gastroesophageal reflux disease), H/O stem cell transplant ([place]) (2015), Hyperlipidemia, Nerve pain, Non Hodgkin's lymphoma ([place]), and Seasonal allergies.   Surgical History    Past Surgical History:  Procedure Laterality Date  . bones removed from Baby toes    . COLONOSCOPY  02/16/2012   Procedure: COLONOSCOPY;  Surgeon: Daneil Dolin, MD;  [facility];  Service: Endoscopy;  Laterality: N/A;  1:30  . COLONOSCOPY N/A 03/26/2017   Procedure: COLONOSCOPY;  Surgeon: Daneil Dolin, MD;  [facility];  Service: Endoscopy;  Laterality: N/A;  930  . IR IMAGING GUIDED PORT INSERTION  12/19/2019  . MEDIASTINOSCOPY N/A 05/13/2013   Procedure: MEDIASTINOSCOPY;  Surgeon: Melrose Nakayama, MD;  Location: [place];  Service: Thoracic;  Laterality: N/A;  . PARTIAL HYSTERECTOMY    . VAGINAL HYSTERECTOMY     partial hyst   . VIDEO BRONCHOSCOPY WITH ENDOBRONCHIAL ULTRASOUND N/A 05/13/2013   Procedure: VIDEO  BRONCHOSCOPY WITH ENDOBRONCHIAL ULTRASOUND;  Surgeon: Melrose Nakayama, MD;  Location: [place];  Service: Thoracic;  Laterality: N/A;     Social History   reports that she quit smoking about 7 years ago. Her smoking use included cigarettes. She smoked 0.50 packs per day. She has never used smokeless tobacco. She reports that she does not drink alcohol or use drugs.   Family History   Her family history includes Asthma in an other family member; Cancer in her father; Colon polyps in her mother; Heart defect in an other family member; Heart disease in her mother; Hyperlipidemia in her father and mother; Hypertension in her father and mother.   Allergies Allergies  Allergen Reactions  . Iodine Anaphylaxis    Told to avoid due to shellfish allergy. Told to avoid due to shellfish allergy.  Marland Kitchen Shellfish-Derived Products Anaphylaxis and Swelling    Eyes swelling, scratchy throat, bumps on lips.     Home Medications  Prior to Admission medications   Medication Sig Start Date End Date Taking? Authorizing Provider  albuterol (PROVENTIL HFA;VENTOLIN HFA) 108 (90 Base) MCG/ACT inhaler Inhale 2 puffs into the lungs every 4 (four) hours as needed for wheezing or shortness of breath. 10/27/16  Yes Pollina, Gwenyth Allegra, MD  amLODipine (NORVASC) 2.5 MG tablet Take 2.5 mg by mouth daily.  12/13/19  Yes [provider]  LANTUS SOLOSTAR 100 UNIT/ML Solostar Pen Inject 70 Units into the skin at bedtime. 03/05/20  Yes Nida, Marella Chimes, MD  latanoprost (XALATAN) 0.005 % ophthalmic solution 1 drop at bedtime. 12/13/19  Yes [provider]  lidocaine-prilocaine (EMLA) cream Apply 1 application topically as  needed. 12/22/19  Yes Wyatt Portela, MD  losartan (COZAAR) 25 MG tablet Take 25 mg by mouth daily.  12/13/19  Yes [provider]  metFORMIN (GLUCOPHAGE) 500 MG tablet Take by mouth 2 (two) times daily with a meal.   Yes [provider]  metoprolol tartrate  (LOPRESSOR) 25 MG tablet Take 25 mg by mouth 2 (two) times daily.    Yes [provider]  montelukast (SINGULAIR) 10 MG tablet Take 10 mg by mouth at bedtime.   Yes [provider]  prochlorperazine (COMPAZINE) 10 MG tablet Take 1 tablet (10 mg total) by mouth every 6 (six) hours as needed for nausea or vomiting. 12/01/19  Yes Wyatt Portela, MD  rosuvastatin (CRESTOR) 10 MG tablet Take 10 mg by mouth daily.   Yes [provider]  ammonium lactate (AMLACTIN) 12 % cream APPLY TO AFFECTED AREA ONCE A DAY FOR 90 DAYS 03/05/20   [provider]  blood glucose meter kit and supplies KIT 1 each by Does not apply route 4 (four) times daily. Dispense based on patient and insurance preference. Use up to four times daily as directed. (FOR ICD-9 250.00, 250.01). Patient not taking: Reported on 04/21/2020 02/21/20   Cassandria Anger, MD  Blood Glucose Monitoring Suppl (ACCU-CHEK GUIDE) w/Device KIT 1 Piece by Does not apply route as directed. Patient not taking: Reported on 04/21/2020 02/20/20   Cassandria Anger, MD  glucose blood (ACCU-CHEK GUIDE) test strip Use as instructed Patient not taking: Reported on 04/21/2020 02/20/20   Cassandria Anger, MD  Insulin Pen Needle (B-D ULTRAFINE III SHORT PEN) 31G X 8 MM MISC 1 each by Does not apply route as directed. Patient not taking: Reported on 04/21/2020 02/20/20   Cassandria Anger, MD     Signature:  Chesley Mires, MD [place] Pager - [phone] 04/23/2020, 1:59 PM

## 2020-04-23 NOTE — Plan of Care (Signed)
  Problem: Education: Goal: Knowledge of General Education information will improve Description: Including pain rating scale, medication(s)/side effects and non-pharmacologic comfort measures Outcome: Progressing   Problem: Activity: Goal: Risk for activity intolerance will decrease Outcome: Progressing   

## 2020-04-23 NOTE — Progress Notes (Signed)
D/w [facility] OR staff.  Team still in the process of getting in-serviced on bronchoscopy.  Will need to arrange for patient to be transferred to [facility] for procedure.  D/w Dr. Carles Collet who will arrange for transfer and have asked PCCM team in [facility] to f/u when patient arrives there.  Chesley Mires, MD [place] Pager - [phone] 04/23/2020, 2:42 PM

## 2020-04-23 NOTE — Progress Notes (Deleted)
   04/23/20 2004  Assess: MEWS Score  Temp (!) 103.8 F (39.9 C)  BP (!) 164/96  Pulse Rate (!) 116  Resp (!) 28  SpO2 94 %  O2 Device Room Air  Assess: MEWS Score  MEWS Temp 2  MEWS Systolic 0  MEWS Pulse 2  MEWS RR 2  MEWS LOC 0  MEWS Score 6  MEWS Score Color Red  Assess: if the MEWS score is Yellow or Red  Were vital signs taken at a resting state? Yes  Focused Assessment Documented focused assessment  Early Detection of Sepsis Score *See Row Information* Low  MEWS guidelines implemented *See Row Information* Yes  Treat  MEWS Interventions Administered prn meds/treatments  Take Vital Signs  Increase Vital Sign Frequency  Red: Q 1hr X 4 then Q 4hr X 4, if remains red, continue Q 4hrs  Escalate  MEWS: Escalate Red: discuss with charge nurse/RN and provider, consider discussing with RRT  Notify: Charge Nurse/RN  Name of Charge Nurse/RN Notified Nikki, RN  Date Charge Nurse/RN Notified 04/23/20  Time Charge Nurse/RN Notified 2010  Notify: Provider  Provider Name/Title Tat  Date Provider Notified 04/23/20  Time Provider Notified 2010  Notification Type Page  Notification Reason Change in status (Elevated MEWS)  Notify: Rapid Response  Name of Rapid Response RN Notified Shanon Brow, RN  Date Rapid Response Notified 04/23/20  Time Rapid Response Notified 2015

## 2020-04-23 NOTE — Progress Notes (Signed)
Late Entry: patient transferred to [facility] by [email or web address] receiving nurse report. 2mg  of morphine prn given prior to transport for 10/10 back pain.

## 2020-04-23 NOTE — Progress Notes (Deleted)
   04/23/20 2004  Assess: MEWS Score  Temp (!) 103.8 F (39.9 C)  BP (!) 164/96  Pulse Rate (!) 116  Resp (!) 28  SpO2 94 %  O2 Device Room Air  Assess: MEWS Score  MEWS Temp 2  MEWS Systolic 0  MEWS Pulse 2  MEWS RR 2  MEWS LOC 0  MEWS Score 6  MEWS Score Color Red  Assess: if the MEWS score is Yellow or Red  Were vital signs taken at a resting state? Yes  Focused Assessment Documented focused assessment  Early Detection of Sepsis Score *See Row Information* Low  MEWS guidelines implemented *See Row Information* Yes  Treat  MEWS Interventions Administered prn meds/treatments;Escalated (See documentation below) (Tylenol and ice packs)  Take Vital Signs  Increase Vital Sign Frequency  Red: Q 1hr X 4 then Q 4hr X 4, if remains red, continue Q 4hrs  Escalate  MEWS: Escalate Red: discuss with charge nurse/RN and provider, consider discussing with RRT  Notify: Charge Nurse/RN  Name of Charge Nurse/RN Notified Nikki, RN  Date Charge Nurse/RN Notified 04/23/20  Time Charge Nurse/RN Notified 2010  Notify: Provider  Provider Name/Title Tat  Date Provider Notified 04/23/20  Time Provider Notified 2010  Notification Type Page  Notification Reason Change in status (Elevated MEWS)  Response See new orders  Date of Provider Response 04/23/20  Time of Provider Response 2018  Notify: Rapid Response  Name of Rapid Response RN Notified Shanon Brow, RN  Date Rapid Response Notified 04/23/20  Time Rapid Response Notified 2015

## 2020-04-24 LAB — COMPREHENSIVE METABOLIC PANEL
ALT: 91 U/L — ABNORMAL HIGH (ref 0–44)
AST: 74 U/L — ABNORMAL HIGH (ref 15–41)
Albumin: 2.1 g/dL — ABNORMAL LOW (ref 3.5–5.0)
Alkaline Phosphatase: 46 U/L (ref 38–126)
Anion gap: 9 (ref 5–15)
BUN: 11 mg/dL (ref 6–20)
CO2: 23 mmol/L (ref 22–32)
Calcium: 8.7 mg/dL — ABNORMAL LOW (ref 8.9–10.3)
Chloride: 109 mmol/L (ref 98–111)
Creatinine, Ser: 0.73 mg/dL (ref 0.44–1.00)
GFR calc Af Amer: >60 mL/min (ref >60)
GFR calc non Af Amer: >60 mL/min (ref >60)
Glucose, Bld: 121 mg/dL — ABNORMAL HIGH (ref 70–99)
Potassium: 4.1 mmol/L (ref 3.5–5.1)
Sodium: 141 mmol/L (ref 135–145)
Total Bilirubin: 0.5 mg/dL (ref 0.3–1.2)
Total Protein: 6.3 g/dL — ABNORMAL LOW (ref 6.5–8.1)

## 2020-04-24 LAB — MAGNESIUM: Magnesium: 1.8 mg/dL (ref 1.7–2.4)

## 2020-04-24 LAB — CBC
HCT: 31.9 % — ABNORMAL LOW (ref 36.0–46.0)
Hemoglobin: 9.8 g/dL — ABNORMAL LOW (ref 12.0–15.0)
MCH: 26.2 pg (ref 26.0–34.0)
MCHC: 30.7 g/dL (ref 30.0–36.0)
MCV: 85.3 fL (ref 80.0–100.0)
Platelets: 176 10*3/uL (ref 150–400)
RBC: 3.74 MIL/uL — ABNORMAL LOW (ref 3.87–5.11)
RDW: 17.2 % — ABNORMAL HIGH (ref 11.5–15.5)
WBC: 6.7 10*3/uL (ref 4.0–10.5)
nRBC: 0 % (ref 0.0–0.2)

## 2020-04-24 LAB — GLUCOSE, CAPILLARY
Glucose-Capillary: 94 mg/dL (ref 70–99)
Glucose-Capillary: 70 mg/dL (ref 70–99)
Glucose-Capillary: 67 mg/dL — ABNORMAL LOW (ref 70–99)
Glucose-Capillary: 105 mg/dL — ABNORMAL HIGH (ref 70–99)
Glucose-Capillary: 52 mg/dL — ABNORMAL LOW (ref 70–99)
Glucose-Capillary: 69 mg/dL — ABNORMAL LOW (ref 70–99)
Glucose-Capillary: 79 mg/dL (ref 70–99)

## 2020-04-24 LAB — MRSA PCR SCREENING: MRSA by PCR: NEGATIVE

## 2020-04-24 MED ORDER — IBUPROFEN 600 MG PO TABS
600.0000 mg | ORAL_TABLET | Freq: Four times a day (QID) | ORAL | Status: DC | PRN
  Administered 2020-04-24 – 2020-05-01 (×6): 600 mg via ORAL
  Filled 2020-04-24 (×6): qty 1

## 2020-04-24 MED ORDER — GLUCOSE 40 % PO GEL
2.0000 | ORAL | Status: AC
  Administered 2020-04-24: 75 g via ORAL

## 2020-04-24 MED ORDER — GLUCOSE 40 % PO GEL
ORAL | Status: AC
  Filled 2020-04-24: qty 2

## 2020-04-24 NOTE — Progress Notes (Signed)
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 Patient states that she was doing some "heavy yard work" the day after which she woke up with right lower back pain. She states that it is worse with certain movements. She denies any saddle anesthesia, leg weakness, bladder or bowel incontinence. She denies any recent falls or injuries. She denies any fevers, chills, chest pain, shortness of breath, abdominal pain, diarrhea, dysuria, hematuria. She had one episode of nausea and vomiting 2 days prior to admission. She has had nonproductive cough. There is no hemoptysis. She has had some intermittent headaches, but this has not been particularly bothering her. Because of her back pain, the patient presented to emergency department. She was found to have a temperature of 102.9 F initially. As result, sepsis work-up was initiated. 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 In the emergency department, the patient was febrile up to 103.3 F with tachycardia 110-120. She was hemodynamically stable with oxygen saturation 96-100% on room air. BMP showed a potassium 3.3, serum creatinine 0.67. LFTs were unremarkable. WBC 8.5, hemoglobin 9.6. Blood cultures and urine cultures were obtained. Lactic acid 0.8. UA 11-20 WBC. Chest x-ray showed bilateral patchy infiltrates. The patient was initially started on azithromycin and ceftriaxone.  She was started on vanco and cefepime with azithromycin.  She continued to have fevers up to  103.  CT chest showed new innumerable pulmonary nodules.  Pulmonary was consulted for bronchoscopy.  Due to logistical reasons, pt had to be transferred to [facility] for bronchoscopy.  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 On vancomycin and cefepime and azithromycin. CT of the chest rapid development bilateral multinodular pulmonary meta stasis, innumerable nodules within the left and right lung which are new from the PET scan on 02/28/2020.  No adenopathy on noncontrast exam.   Scheduled for bronchoscopy 04/25/2020 MRSA negative Urine Legionella pending Blood culture pending QuantiFERON pending  #2 type 2 diabetes with hyperglycemia uncontrolled on Metformin at home.  Hemoglobin A1c is 9.9 on 02/20/2020 On SSI CBG (last 3)  Recent Labs    04/23/20 2017 04/24/20 0637 04/24/20 1126  GLUCAP 98 94 70    #3 lower back pain plain x-ray negative for fractures, CT of the lumbar spine no evidence of discitis or osteomyelitis.  #4 history of anaplastic T-cell lymphoma followed by Dr. Alen Blew.  Last chemotherapy 04/05/2020.  #5 history of essential hypertension-blood pressure 156/92 on metoprolol, restart Norvasc.  #6 hyperlipidemia on Crestor  Estimated body mass index is 34.46 kg/m as calculated from the following:   Height as of this encounter: 5\' 8"  (1.727 m).   Weight as of  this encounter: 102.8 kg.  DVT prophylaxis:  Code Status: Family Communication:  Disposition Plan:  Status is: Inpatient patient with multiple  bilateral lung nodules scheduled for bronchoscopy tomorrow.  Dispo: The patient is from: home              Anticipated d/c is to: unknown              Anticipated d/c date is: unknown              Patient currently is not medically stable to d/c.   Consultants: PCCM  Procedures: None Antimicrobials: Vanco cefepime and azithromycin  Subjective: Patient resting in bed eating breakfast feels breathing is okay denies any nausea vomiting  Objective: Vitals:   04/24/20 0418 04/24/20 0515 04/24/20 0516 04/24/20 0941  BP: (!) 135/94 108/66 108/66 122/83  Pulse: 88 73 75 75  Resp: 20 20 20 18   Temp: 98.3 F (36.8 C) 97.8 F (36.6 C) 97.8 F (36.6 C)   TempSrc: Oral Oral Oral   SpO2: 95% 100% 98% 100%  Weight:      Height:        Intake/Output Summary (Last 24 hours) at 04/24/2020 1112 Last data filed at 04/24/2020 0521 Gross per 24 hour  Intake 3079.76 ml  Output 700 ml  Net 2379.76 ml   Filed Weights   04/21/20 1819 04/22/20 1700 04/23/20 2102  Weight: 99.8 kg 100.7 kg 102.8 kg    Examination:  General exam: Appears calm and comfortable  Respiratory system: Clear to auscultation. Respiratory effort normal. Cardiovascular system: S1 & S2 heard, RRR. No JVD, murmurs, rubs, gallops or clicks. No pedal edema. Gastrointestinal system: Abdomen is nondistended, soft and nontender. No organomegaly or masses felt. Normal bowel sounds heard. Central nervous system: Alert and oriented. No focal neurological deficits. Extremities: Symmetric 5 x 5 power. Skin: No rashes, lesions or ulcers Psychiatry: Judgement and insight appear normal. Mood & affect appropriate.     Data Reviewed: I have personally reviewed following labs and imaging studies  CBC: Recent Labs  Lab 04/21/20 1935 04/22/20 0508 04/23/20 0621 04/24/20 0928  WBC 9.5 8.5 7.4 6.7  NEUTROABS 6.4  --   --   --   HGB 10.2* 9.6* 9.3* 9.8*  HCT 32.7* 30.5* 29.5* 31.9*  MCV 85.6 85.4 85.0 85.3  PLT 238 228 200 032    Basic Metabolic Panel: Recent Labs  Lab 04/21/20 1935 04/22/20 0508 04/23/20 0621 04/24/20 0928  NA 137 139 137 141  K 2.9* 3.3* 3.6 4.1  CL 100 104 105 109  CO2 27 25 22 23   GLUCOSE 105* 117* 119* 121*  BUN 10 10 10 11   CREATININE 0.87 0.67 0.70 0.73  CALCIUM 8.8* 8.6* 8.4* 8.7*  MG  --   --  1.6* 1.8   GFR: Estimated Creatinine Clearance: 96.2 mL/min (by C-G formula based on SCr of 0.73 mg/dL). Liver Function Tests: Recent Labs  Lab 04/21/20 1935 04/23/20 0621 04/24/20 0928  AST 36 81* 74*  ALT 31 79* 91*  ALKPHOS 57 50 46  BILITOT 0.8 0.7 0.5  PROT 7.5 6.6 6.3*  ALBUMIN 2.9* 2.4* 2.1*   No results for input(s): LIPASE, AMYLASE in the last 168 hours. No results for input(s): AMMONIA in the last 168 hours. Coagulation Profile: Recent Labs  Lab 04/21/20 1935  INR 1.3*   Cardiac Enzymes: No results for input(s): CKTOTAL, CKMB, CKMBINDEX, TROPONINI in the last 168 hours. BNP (last 3 results) No results for input(s): PROBNP in  the last 8760 hours. HbA1C: Recent Labs    04/22/20 0507  HGBA1C 7.4*   CBG: Recent Labs  Lab 04/23/20 1142 04/23/20 1206 04/23/20 1622 04/23/20 2017 04/24/20 0637  GLUCAP 53* 85 106* 98 94   Lipid Profile: No results for input(s): CHOL, HDL, LDLCALC, TRIG, CHOLHDL, LDLDIRECT in the last 72 hours. Thyroid Function Tests: No results for input(s): TSH, T4TOTAL, FREET4, T3FREE, THYROIDAB in the last 72 hours. Anemia Panel: No results for input(s): VITAMINB12, FOLATE, FERRITIN, TIBC, IRON, RETICCTPCT in the last 72 hours. Sepsis Labs: Recent Labs  Lab 04/21/20 1935 04/21/20 2135 04/22/20 0508 04/23/20 0621  PROCALCITON  --   --  0.69 0.38  LATICACIDVEN 0.8 0.8  --   --     Recent Results (from the past 240 hour(s))  Urine culture     Status: Abnormal   Collection Time: 04/21/20  6:44 PM   Specimen: In/Out Cath Urine  Result Value Ref Range Status   Specimen Description   Final    IN/OUT CATH URINE Performed at  [facility], [address], [place], [place] [address]    Special Requests   Final    NONE Performed at [facility], [address] [place], [place] [address]    Culture MULTIPLE SPECIES PRESENT, SUGGEST RECOLLECTION (A)  Final   Report Status 04/23/2020 FINAL  Final  Blood Culture (routine x 2)     Status: None (Preliminary result)   Collection Time: 04/21/20  7:35 PM   Specimen: Right Antecubital; Blood  Result Value Ref Range Status   Specimen Description RIGHT ANTECUBITAL  Final   Special Requests   Final    BOTTLES DRAWN AEROBIC AND ANAEROBIC Blood Culture adequate volume   Culture   Final    NO GROWTH 3 DAYS Performed at [facility], [address], [place], [place] [address]    Report Status PENDING  Incomplete  Blood Culture (routine x 2)     Status: None (Preliminary result)   Collection Time: 04/21/20  8:05 PM   Specimen: Porta Cath; Blood  Result Value Ref Range Status   Specimen Description PORTA CATH DRAWN BY RN  Final   Special Requests   Final    BOTTLES DRAWN AEROBIC AND ANAEROBIC Blood Culture adequate volume   Culture   Final    NO GROWTH 3 DAYS Performed at [facility], [address], [place], [place] [address]    Report Status PENDING  Incomplete  Respiratory Panel by RT PCR (Flu A&B, Covid) - Nasopharyngeal Swab     Status: None   Collection Time: 04/21/20  9:18 PM   Specimen: Nasopharyngeal Swab  Result Value Ref Range Status   SARS Coronavirus 2 by RT PCR NEGATIVE NEGATIVE Final    Comment: (NOTE) SARS-CoV-2 target nucleic acids are NOT DETECTED. The SARS-CoV-2 RNA is generally detectable in upper respiratoy specimens during the acute phase of infection. The lowest concentration of SARS-CoV-2 viral copies this assay can detect is 131 copies/mL. A negative result does not preclude SARS-Cov-2 infection and should not be used as the sole basis for treatment or other patient management decisions. A negative result may occur with  improper specimen  collection/handling, submission of specimen other than nasopharyngeal swab, presence of viral mutation(s) within the areas targeted by this assay, and inadequate number of viral copies (<131 copies/mL). A negative result must be combined with clinical observations, patient history, and epidemiological information. The expected result is Negative. Fact Sheet for Patients:  PinkCheek.be Fact Sheet  for Healthcare Providers:  GravelBags.it This test is not yet ap proved or cleared by the [place] and  has been authorized for detection and/or diagnosis of SARS-CoV-2 by FDA under an Emergency Use Authorization (EUA). This EUA will remain  in effect (meaning this test can be used) for the duration of the COVID-19 declaration under Section 564(b)(1) of the Act, 21 U.S.C. section 360bbb-3(b)(1), unless the authorization is terminated or revoked sooner.    Influenza A by PCR NEGATIVE NEGATIVE Final   Influenza B by PCR NEGATIVE NEGATIVE Final    Comment: (NOTE) The Xpert Xpress SARS-CoV-2/FLU/RSV assay is intended as an aid in  the diagnosis of influenza from Nasopharyngeal swab specimens and  should not be used as a sole basis for treatment. Nasal washings and  aspirates are unacceptable for Xpert Xpress SARS-CoV-2/FLU/RSV  testing. Fact Sheet for Patients: PinkCheek.be Fact Sheet for Healthcare Providers: GravelBags.it This test is not yet approved or cleared by the [place] FDA and  has been authorized for detection and/or diagnosis of SARS-CoV-2 by  FDA under an Emergency Use Authorization (EUA). This EUA will remain  in effect (meaning this test can be used) for the duration of the  Covid-19 declaration under Section 564(b)(1) of the Act, 21  U.S.C. section 360bbb-3(b)(1), unless the authorization is  terminated or revoked. Performed at [facility],  [address], [place], [place] [address]   MRSA PCR Screening     Status: None   Collection Time: 04/24/20 12:19 AM  Result Value Ref Range Status   MRSA by PCR NEGATIVE NEGATIVE Final    Comment:        The GeneXpert MRSA Assay (FDA approved for NASAL specimens only), is one component of a comprehensive MRSA colonization surveillance program. It is not intended to diagnose MRSA infection nor to guide or monitor treatment for MRSA infections. Performed at [facility], [place] [address] [place], [place] [address]          Radiology Studies: No results found.      Scheduled Meds: . azithromycin  500 mg Oral Daily  . Chlorhexidine Gluconate Cloth  6 each Topical Daily  . enoxaparin (LOVENOX) injection  40 mg Subcutaneous QHS  . insulin aspart  0-5 Units Subcutaneous QHS  . insulin aspart  0-9 Units Subcutaneous TID WC  . metoprolol tartrate  25 mg Oral BID  . montelukast  10 mg Oral QHS  . rosuvastatin  10 mg Oral Daily   Continuous Infusions: . sodium chloride    . 0.9 % NaCl with KCl 20 mEq / L 100 mL/hr at 04/24/20 1100  . ceFEPime (MAXIPIME) IV 2 g (04/24/20 0840)  . vancomycin 1,000 mg (04/24/20 0016)     LOS: 3 days     Georgette Shell, MD  04/24/2020, 11:12 AM

## 2020-04-24 NOTE — Progress Notes (Signed)
NAME:  ADRIONA KANEY, MRN:  [id number], DOB:  1961/10/22, LOS: 3 ADMISSION DATE:  04/21/2020, CONSULTATION DATE:  04/23/2020 REFERRING MD:  Dr. Carles Collet, [facility] CHIEF COMPLAINT: Cough  Brief History   59 yr old female former smoker CD30 positive anaplastic large T cell lymphoma with relapse in December 2020 on salvage therapy with adcetris presented to [facility] on 04/21/20 with back pain, cough, and fever.  Found to have new b/l innumerable nodules of lung on CT chest from 04/22/20.  History of present illness   59 yo female was diagnosed with lymphoma in June of 2014.  She had autologous stem cell transplant (SCT) in February 2015.  Completed 6 cycles of CHOEP.  She was found to have relapse in 11/ 2020 by left supraclavicular lymph node biopsy.  She was started on adcetris in December 2020.  Only apparent side effect was neuropathy.  She had PET scan on 02/28/20 (reviewed by me) that showed near complete response in adenopathy and no lung nodules.   She was working in her yard about 2 weeks prior to admission.  She developed back pain and presented to the ER.  She also developed a cough.  In ER she was found to have fever 102.79F and CXR showed b/l ASD.  She was started on broad spectrum antibiotics.  She continued to have fevers.  She had CT chest on 04/22/20 that showed new b/l innumerable lung nodules.    About one week prior to admission she noticed feeling more fatigued and having sweats.  She wasn't aware of having.  She though her cough was related to allergies.  She had TB testing in 2014 prior to starting therapy for lymphoma and this was negative.  She is from [place].  No recent travel or sick exposures.  Denies any animal/bird exposures.  Past Medical History  HTN, DM, [facility] Events   5/08 Admit  Consults:    Procedures:    Significant Diagnostic Tests:  CT chest 04/22/20 >> new diffuse b/l innumerable pulmonary nodules ranging in size from 2 to 10 mm  Micro  Data:  SARS CoV2 PCR 5/08 >> negative Blood 5/08 >>ng HIV 5/09 >> nonreactive Legionella 5/09 >>  Histoplasma Ag 5/10 >> Aspergillus Ab 5/10 >>  Quantiferon gold 5/10 >>    Antimicrobials:  Rocephin 5/08 Zithromax 5/08 >> Vancomycin 5/09 >> Cefepime 5/09 >>  Interim history/subjective:  Febrile 102 Denies chills or cough Right chest pain is improved  Objective   Blood pressure 122/83, pulse 75, temperature 97.8 F (36.6 C), temperature source Oral, resp. rate 18, height 5\' 8"  (1.727 m), weight 102.8 kg, SpO2 100 %.        Intake/Output Summary (Last 24 hours) at 04/24/2020 1004 Last data filed at 04/24/2020 0521 Gross per 24 hour  Intake 3079.76 ml  Output 1050 ml  Net 2029.76 ml   Filed Weights   04/21/20 1819 04/22/20 1700 04/23/20 2102  Weight: 99.8 kg 100.7 kg 102.8 kg    Examination:  General -middle-aged woman, no distress, able to speak in full sentences HEENT- no pallor, icterus , no lymphadenopathy Cardiac - regular rate/rhythm, no murmur Chest - equal breath sounds b/l, no wheezing or rales, port in Lt upper chest w/o erythema or tenderness Abdomen - soft, non tender, + bowel sounds Extremities - no cyanosis, clubbing, or edema Skin - no rashes Neuro - normal strength, moves extremities, follows commands Psych - normal mood and behavior    Discussion:  59 yo female with new fever, cough found to have innumerable pulmonary nodules on CT chest.  She has hx of T cell lymphoma and has been on therapy for this with adcetris.  Her PET scan from March 2021 did not show these nodules.  Assessment & Plan:   Lung nodules. -Bronchoscopy with transbronchial biopsy scheduled for 5/12 at 3:30 PM for an endoscopy The various options of biopsy including bronchoscopy, CT guided needle aspiration and surgical biopsy were discussed.The risks of each procedure including coughing, bleeding and the  chances of lung puncture requiring chest tube were discussed in great  detail. The benefits & alternatives including serial follow up were also discussed. NPO after 5AM  - continue current ABx for now -Await QuantiFERON and further testing -aspergillus, fungitell  Kara Mead MD. FCCP. [facility]  If no response to pager , please call [phone]   04/24/2020

## 2020-04-24 NOTE — Plan of Care (Signed)
  Problem: Education: Goal: Knowledge of General Education information will improve Description Including pain rating scale, medication(s)/side effects and non-pharmacologic comfort measures Outcome: Progressing   

## 2020-04-24 NOTE — Progress Notes (Addendum)
   04/23/20 2004  Assess: MEWS Score  Temp (!) 103.8 F (39.9 C)  BP (!) 164/96  Pulse Rate (!) 116  Resp (!) 28  Level of Consciousness Alert  SpO2 94 %  O2 Device Room Air  Assess: MEWS Score  MEWS Temp 2  MEWS Systolic 0  MEWS Pulse 2  MEWS RR 2  MEWS LOC 0  MEWS Score 6  MEWS Score Color Red  Assess: if the MEWS score is Yellow or Red  Were vital signs taken at a resting state? Yes  Focused Assessment Documented focused assessment  Early Detection of Sepsis Score *See Row Information* Low  MEWS guidelines implemented *See Row Information* Yes  Treat  MEWS Interventions Administered prn meds/treatments;Escalated (See documentation below) (Tylenol and ice packs)  Take Vital Signs  Increase Vital Sign Frequency  Red: Q 1hr X 4 then Q 4hr X 4, if remains red, continue Q 4hrs  Escalate  MEWS: Escalate Red: discuss with charge nurse/RN and provider, consider discussing with RRT  Notify: Charge Nurse/RN  Name of Charge Nurse/RN Notified Nikki, RN  Date Charge Nurse/RN Notified 04/23/20  Time Charge Nurse/RN Notified 2010  Notify: Provider  Provider Name/Title Sharlet Salina, NP  Date Provider Notified 04/23/20  Time Provider Notified 2010  Notification Type Page  Notification Reason Change in status (Elevated MEWS)  Response See new orders  Date of Provider Response 04/23/20  Time of Provider Response 2018  Notify: Rapid Response  Name of Rapid Response RN Notified Shanon Brow, RN  Date Rapid Response Notified 04/23/20  Time Rapid Response Notified 2015  Document  Patient Outcome Stabilized after interventions  Progress note created (see row info) Yes

## 2020-04-24 NOTE — Progress Notes (Signed)
Inpatient Diabetes Program Recommendations  AACE/ADA: New Consensus Statement on Inpatient Glycemic Control (2015)  Target Ranges:  Prepandial:   less than 140 mg/dL      Peak postprandial:   less than 180 mg/dL (1-2 hours)      Critically ill patients:  140 - 180 mg/dL   Lab Results  Component Value Date   GLUCAP 94 04/24/2020   HGBA1C 7.4 (H) 04/22/2020    Review of Glycemic Control Results for Megan Mcknight, Megan Mcknight (MRN [id number]) as of 04/24/2020 11:09  Ref. Range 04/23/2020 11:42 04/23/2020 12:06 04/23/2020 16:22 04/23/2020 20:17 04/24/2020 06:37  Glucose-Capillary Latest Ref Range: 70 - 99 mg/dL 53 (L) 85 106 (H) 98 94    Inpatient Diabetes Program Recommendations:    Novolog 0-6 TID (very sensitive scale) as patient is running on the low side.  Thank you, Reche Dixon, RN, BSN Diabetes Coordinator Inpatient Diabetes Program [phone] (team pager from 8a-5p)

## 2020-04-24 NOTE — Progress Notes (Signed)
   04/24/20 0115  Assess: MEWS Score  Temp (!) 102.2 F (39 C)  BP (!) 155/84  Pulse Rate (!) 102  Resp (!) 27  Level of Consciousness Alert  SpO2 97 %  O2 Device Nasal Cannula  O2 Flow Rate (L/min) 2 L/min  Assess: MEWS Score  MEWS Temp 2  MEWS Systolic 0  MEWS Pulse 1  MEWS RR 2  MEWS LOC 0  MEWS Score 5  MEWS Score Color Red  Assess: if the MEWS score is Yellow or Red  Were vital signs taken at a resting state? Yes  Focused Assessment Documented focused assessment  Early Detection of Sepsis Score *See Row Information* High  MEWS guidelines implemented *See Row Information* Yes  Treat  MEWS Interventions Administered prn meds/treatments;Escalated (See documentation below) (Advil)  Take Vital Signs  Increase Vital Sign Frequency  Red: Q 1hr X 4 then Q 4hr X 4, if remains red, continue Q 4hrs  Escalate  MEWS: Escalate Red: discuss with charge nurse/RN and provider, consider discussing with RRT  Notify: Charge Nurse/RN  Name of Charge Nurse/RN Notified Nikki, RN  Date Charge Nurse/RN Notified 04/24/20  Time Charge Nurse/RN Notified 0115  Notify: Provider  Provider Name/Title Sharlet Salina, NP  Date Provider Notified 04/24/20  Time Provider Notified 0122  Notification Type Page  Notification Reason Change in status  Response See new orders  Date of Provider Response 04/24/20  Time of Provider Response 0148  Notify: Rapid Response  Name of Rapid Response RN Notified Shanon Brow, RN  Date Rapid Response Notified 04/24/20  Time Rapid Response Notified 0122  Document  Patient Outcome Stabilized after interventions  Progress note created (see row info) Yes

## 2020-04-24 NOTE — Progress Notes (Signed)
I was called to the patient's by her to give a PRN breathing treatment. The patient stated that she did not need a treatment and didn't ask for one. Her BBS are clear throughout. RN came to bedside and she told her RN that she didn't want a treatment if she didn't need. She will call if her WOB increases.

## 2020-04-24 NOTE — Progress Notes (Addendum)
New Admission Note: ? Arrival Method: via stretcher  Mental Orientation: A/O x 4 Telemetry: Box #Box 22 NSR, ST Assessment: Completed Skin: Refer to flowsheet IV: Right AC  Pain: 0/10 Tubes: Safety Measures: Safety Fall Prevention Plan discussed with patient. Admission: Completed 5 [place] Orientation: Patient has been orientated to the room, unit and the staff.  Orders have been reviewed and are being implemented. Will continue to monitor the patient. Call light has been placed within reach and bed alarm has been activated.  ? [employer], [place]

## 2020-04-25 ENCOUNTER — Inpatient Hospital Stay (HOSPITAL_COMMUNITY): Payer: BC Managed Care – PPO | Admitting: Certified Registered"

## 2020-04-25 ENCOUNTER — Encounter (HOSPITAL_COMMUNITY)
Admission: EM | Disposition: A | Discharge status: Home Health | Payer: Self-pay | Source: Home / Self Care | Attending: Internal Medicine

## 2020-04-25 ENCOUNTER — Encounter (HOSPITAL_COMMUNITY): Payer: Self-pay | Admitting: Internal Medicine

## 2020-04-25 ENCOUNTER — Other Ambulatory Visit: Payer: Self-pay

## 2020-04-25 ENCOUNTER — Inpatient Hospital Stay (HOSPITAL_COMMUNITY): Payer: BC Managed Care – PPO

## 2020-04-25 DIAGNOSIS — R918 Other nonspecific abnormal finding of lung field: Secondary | ICD-10-CM

## 2020-04-25 DIAGNOSIS — A419 Sepsis, unspecified organism: Secondary | ICD-10-CM

## 2020-04-25 DIAGNOSIS — J189 Pneumonia, unspecified organism: Secondary | ICD-10-CM

## 2020-04-25 HISTORY — PX: LUNG BIOPSY: SHX5088

## 2020-04-25 HISTORY — PX: VIDEO BRONCHOSCOPY: SHX5072

## 2020-04-25 HISTORY — PX: BRONCHIAL BRUSHINGS: SHX5108

## 2020-04-25 HISTORY — PX: BRONCHIAL WASHINGS: SHX5105

## 2020-04-25 LAB — COMPREHENSIVE METABOLIC PANEL
ALT: 111 U/L — ABNORMAL HIGH (ref 0–44)
AST: 78 U/L — ABNORMAL HIGH (ref 15–41)
Albumin: 2.4 g/dL — ABNORMAL LOW (ref 3.5–5.0)
Alkaline Phosphatase: 62 U/L (ref 38–126)
Anion gap: 14 (ref 5–15)
BUN: 9 mg/dL (ref 6–20)
CO2: 21 mmol/L — ABNORMAL LOW (ref 22–32)
Calcium: 9 mg/dL (ref 8.9–10.3)
Chloride: 105 mmol/L (ref 98–111)
Creatinine, Ser: 0.68 mg/dL (ref 0.44–1.00)
GFR calc Af Amer: >60 mL/min (ref >60)
GFR calc non Af Amer: >60 mL/min (ref >60)
Glucose, Bld: 103 mg/dL — ABNORMAL HIGH (ref 70–99)
Potassium: 4.1 mmol/L (ref 3.5–5.1)
Sodium: 140 mmol/L (ref 135–145)
Total Bilirubin: 0.5 mg/dL (ref 0.3–1.2)
Total Protein: 7.3 g/dL (ref 6.5–8.1)

## 2020-04-25 LAB — CBC
HCT: 32.8 % — ABNORMAL LOW (ref 36.0–46.0)
Hemoglobin: 10.4 g/dL — ABNORMAL LOW (ref 12.0–15.0)
MCH: 26.8 pg (ref 26.0–34.0)
MCHC: 31.7 g/dL (ref 30.0–36.0)
MCV: 84.5 fL (ref 80.0–100.0)
Platelets: 217 10*3/uL (ref 150–400)
RBC: 3.88 MIL/uL (ref 3.87–5.11)
RDW: 17.3 % — ABNORMAL HIGH (ref 11.5–15.5)
WBC: 10.6 10*3/uL — ABNORMAL HIGH (ref 4.0–10.5)
nRBC: 0 % (ref 0.0–0.2)

## 2020-04-25 LAB — QUANTIFERON-TB GOLD PLUS (RQFGPL)
QuantiFERON Mitogen Value: 1.83 IU/mL
QuantiFERON Nil Value: 0.46 IU/mL
QuantiFERON TB1 Ag Value: 0.45 IU/mL
QuantiFERON TB2 Ag Value: 0.44 IU/mL

## 2020-04-25 LAB — GLUCOSE, CAPILLARY
Glucose-Capillary: 79 mg/dL (ref 70–99)
Glucose-Capillary: 101 mg/dL — ABNORMAL HIGH (ref 70–99)
Glucose-Capillary: 101 mg/dL — ABNORMAL HIGH (ref 70–99)
Glucose-Capillary: 88 mg/dL (ref 70–99)
Glucose-Capillary: 92 mg/dL (ref 70–99)

## 2020-04-25 LAB — QUANTIFERON-TB GOLD PLUS: QuantiFERON-TB Gold Plus: NEGATIVE

## 2020-04-25 LAB — FUNGITELL, SERUM: Fungitell Result: <31 pg/mL (ref <80)

## 2020-04-25 SURGERY — BRONCHOSCOPY, WITH FLUOROSCOPY
Anesthesia: Monitor Anesthesia Care

## 2020-04-25 MED ORDER — ACETAMINOPHEN 10 MG/ML IV SOLN
INTRAVENOUS | Status: DC | PRN
Start: 2020-04-25 — End: 2020-04-25
  Administered 2020-04-25: 1000 mg via INTRAVENOUS

## 2020-04-25 MED ORDER — FENTANYL CITRATE (PF) 250 MCG/5ML IJ SOLN
INTRAMUSCULAR | Status: DC | PRN
  Administered 2020-04-25 (×2): 50 ug via INTRAVENOUS

## 2020-04-25 MED ORDER — ALBUTEROL SULFATE (2.5 MG/3ML) 0.083% IN NEBU
2.5000 mg | INHALATION_SOLUTION | RESPIRATORY_TRACT | Status: AC
  Administered 2020-04-25: 2.5 mg via RESPIRATORY_TRACT

## 2020-04-25 MED ORDER — ROCURONIUM BROMIDE 10 MG/ML (PF) SYRINGE
PREFILLED_SYRINGE | INTRAVENOUS | Status: DC | PRN
  Administered 2020-04-25: 50 mg via INTRAVENOUS

## 2020-04-25 MED ORDER — ALBUTEROL SULFATE (2.5 MG/3ML) 0.083% IN NEBU
INHALATION_SOLUTION | RESPIRATORY_TRACT | Status: AC
  Filled 2020-04-25: qty 3

## 2020-04-25 MED ORDER — SODIUM CHLORIDE 0.9% FLUSH
10.0000 mL | Freq: Two times a day (BID) | INTRAVENOUS | Status: DC
  Administered 2020-04-27 – 2020-05-03 (×3): 10 mL

## 2020-04-25 MED ORDER — PHENYLEPHRINE HCL 0.25 % NA SOLN: 1.0000 | Freq: Four times a day (QID) | NASAL | Status: DC | PRN

## 2020-04-25 MED ORDER — PHENYLEPHRINE HCL-NACL 10-0.9 MG/250ML-% IV SOLN
INTRAVENOUS | Status: DC | PRN
  Administered 2020-04-25: 40 ug/min via INTRAVENOUS

## 2020-04-25 MED ORDER — LACTATED RINGERS IV SOLN: INTRAVENOUS | Status: DC | PRN

## 2020-04-25 MED ORDER — FENTANYL CITRATE (PF) 100 MCG/2ML IJ SOLN
INTRAMUSCULAR | Status: AC
  Filled 2020-04-25: qty 2

## 2020-04-25 MED ORDER — PHENYLEPHRINE 40 MCG/ML (10ML) SYRINGE FOR IV PUSH (FOR BLOOD PRESSURE SUPPORT)
PREFILLED_SYRINGE | INTRAVENOUS | Status: DC | PRN
  Administered 2020-04-25: 80 ug via INTRAVENOUS

## 2020-04-25 MED ORDER — PROPOFOL 10 MG/ML IV BOLUS
INTRAVENOUS | Status: DC | PRN
  Administered 2020-04-25: 100 mg via INTRAVENOUS

## 2020-04-25 MED ORDER — SUGAMMADEX SODIUM 200 MG/2ML IV SOLN
INTRAVENOUS | Status: DC | PRN
  Administered 2020-04-25: 210 mg via INTRAVENOUS

## 2020-04-25 MED ORDER — SODIUM CHLORIDE 0.9% FLUSH
10.0000 mL | INTRAVENOUS | Status: DC | PRN
  Administered 2020-04-27 – 2020-05-04 (×2): 10 mL

## 2020-04-25 MED ORDER — ONDANSETRON HCL 4 MG/2ML IJ SOLN
INTRAMUSCULAR | Status: DC | PRN
  Administered 2020-04-25: 4 mg via INTRAVENOUS

## 2020-04-25 MED ORDER — LIDOCAINE 2% (20 MG/ML) 5 ML SYRINGE
INTRAMUSCULAR | Status: DC | PRN
  Administered 2020-04-25: 60 mg via INTRAVENOUS

## 2020-04-25 MED ORDER — LIDOCAINE HCL URETHRAL/MUCOSAL 2 % EX GEL: 1.0000 "application " | Freq: Once | CUTANEOUS | Status: DC

## 2020-04-25 MED FILL — Dexamethasone Sodium Phosphate Inj 100 MG/10ML: INTRAMUSCULAR | Qty: 1 | Status: AC

## 2020-04-25 NOTE — Progress Notes (Signed)
   04/24/20 2242  Assess: MEWS Score  Temp (!) 103.2 F (39.6 C)  BP (!) 143/78  Pulse Rate (!) 108  Resp (!) 30  Level of Consciousness Alert  SpO2 93 %  O2 Device Room Air  Assess: MEWS Score  MEWS Temp 2  MEWS Systolic 0  MEWS Pulse 1  MEWS RR 2  MEWS LOC 0  MEWS Score 5  MEWS Score Color Red  Assess: if the MEWS score is Yellow or Red  Were vital signs taken at a resting state? Yes  Focused Assessment Documented focused assessment  Early Detection of Sepsis Score *See Row Information* High  MEWS guidelines implemented *See Row Information* Yes  Treat  MEWS Interventions Administered prn meds/treatments (Tylenol )  Take Vital Signs  Increase Vital Sign Frequency  Red: Q 1hr X 4 then Q 4hr X 4, if remains red, continue Q 4hrs  Escalate  MEWS: Escalate Red: discuss with charge nurse/RN and provider, consider discussing with RRT  Notify: Charge Nurse/RN  Name of Charge Nurse/RN Notified Cheryl, RN  Date Charge Nurse/RN Notified 04/24/20  Time Charge Nurse/RN Notified 2253  Notify: Provider  Provider Name/Title T. Opyd, MD  Date Provider Notified 04/24/20  Time Provider Notified 2249  Notification Type Page  Notification Reason Change in status  Response No new orders (Give PRN meds)  Date of Provider Response 04/24/20  Time of Provider Response 2255  Notify: Rapid Response  Name of Rapid Response RN Notified Shanon Brow, RN  Date Rapid Response Notified 04/24/20  Time Rapid Response Notified 2252  Document  Patient Outcome Stabilized after interventions (temp trended down )  Progress note created (see row info) Yes

## 2020-04-25 NOTE — Progress Notes (Signed)
PROGRESS NOTE    Megan Mcknight  YHC:[id number] DOB: 1961/04/15 DOA: 04/21/2020 PCP: Rosita Fire, MD    Brief Narrative:  59 year old female with history of anaplastic T-cell lymphoma diagnosed in 2014, status post stem cell transplant in 2015, relapse on 11/2019 and currently on Adcetris followed by Dr. Alen Blew, type 2 diabetes on insulin, hyperlipidemia presented to the emergency room with about 2-week history of right lower back pain.  She was found to have temperature of 102.9.  Sepsis work-up initiated.  CT chest showed new innumerable pulmonary nodules.  For bronchoscopy and further diagnostic, she was transferred to [facility].   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  In the setting of immunocompromised state. Hemodynamically stabilizing. Patient on vancomycin, cefepime and azithromycin.  CT of the chest showed rapid development of bilateral pulmonary nodule pulmonary metastasis lesions, nodules not present on PET scan on 3/16. Cultures are negative so far.  QuantiFERON test pending.  Legionella and fungal panel pending. Scheduled for bronc, lavage and biopsy today.  Continue antibiotics until final culture and improvement.  Type 2 diabetes with hyperglycemia.  Hypoglycemic episodes in the hospital.  Holding all long-acting insulin, Metformin and keeping on sensitive sliding scale.  We will continue low-dose insulin until she has a good appetite.  Lower back pain: Negative CT scan.  Improving.  Anaplastic T-cell lymphoma on  chemotherapy: Last chemo 4/22.  I updated her oncologist about patient's clinical status and current hospitalization.  Hypertension: Stable.  On metoprolol and Norvasc.   DVT prophylaxis: Lovenox subcu Code Status: Full code Family Communication: None Disposition Plan: Status is: Inpatient  Remains inpatient appropriate because:Inpatient level of care appropriate due to severity of illness   Dispo: The patient is from: Home              Anticipated d/c is to: Home              Anticipated d/c date is: 2 days              Patient currently is not medically stable to d/c.          Consultants:   PCCM  Procedures:   None  Antimicrobials:  Antibiotics Given (last 72 hours)    Date/Time Action Medication Dose Rate   04/22/20 1724 New Bag/Given   ceFEPIme (MAXIPIME) 2 g in sodium chloride 0.9 % 100 mL IVPB 2 g 200 mL/hr   04/22/20 2217 New Bag/Given   azithromycin (ZITHROMAX) 500 mg in sodium chloride 0.9 % 250 mL IVPB 500 mg 250 mL/hr   04/22/20 2351 New Bag/Given   vancomycin (VANCOCIN) IVPB 1000 mg/200 mL premix 1,000 mg 200 mL/hr   04/23/20 0217 New Bag/Given   ceFEPIme (MAXIPIME) 2 g in sodium chloride 0.9 % 100 mL IVPB 2 g 200 mL/hr   04/23/20 0901 New Bag/Given   ceFEPIme (MAXIPIME) 2 g in sodium chloride 0.9 % 100 mL IVPB 2 g 200 mL/hr   04/23/20 1146 New Bag/Given   vancomycin (VANCOCIN) IVPB 1000 mg/200 mL premix 1,000 mg  200 mL/hr   04/23/20 1720 New Bag/Given   ceFEPIme (MAXIPIME) 2 g in sodium chloride 0.9 % 100 mL IVPB 2 g 200 mL/hr   04/23/20 2232 Given   azithromycin (ZITHROMAX) tablet 500 mg 500 mg    04/24/20 0016 New Bag/Given   vancomycin (VANCOCIN) IVPB 1000 mg/200 mL premix 1,000 mg 200 mL/hr   04/24/20 0121 New Bag/Given   ceFEPIme (MAXIPIME) 2 g in sodium chloride 0.9 % 100 mL IVPB 2 g 200 mL/hr   04/24/20 0840 New Bag/Given   ceFEPIme (MAXIPIME) 2 g in sodium chloride 0.9 % 100 mL IVPB 2 g 200 mL/hr   04/24/20 1231 New Bag/Given    vancomycin (VANCOCIN) IVPB 1000 mg/200 mL premix 1,000 mg 200 mL/hr   04/24/20 1608 New Bag/Given   ceFEPIme (MAXIPIME) 2 g in sodium chloride 0.9 % 100 mL IVPB 2 g 200 mL/hr   04/24/20 2102 Given   azithromycin (ZITHROMAX) tablet 500 mg 500 mg    04/24/20 2304 New Bag/Given   vancomycin (VANCOCIN) IVPB 1000 mg/200 mL premix 1,000 mg 200 mL/hr   04/25/20 0012 New Bag/Given   ceFEPIme (MAXIPIME) 2 g in sodium chloride 0.9 % 100 mL IVPB 2 g 200 mL/hr   04/25/20 2725 New Bag/Given   ceFEPIme (MAXIPIME) 2 g in sodium chloride 0.9 % 100 mL IVPB 2 g 200 mL/hr   04/25/20 1046 New Bag/Given   vancomycin (VANCOCIN) IVPB 1000 mg/200 mL premix 1,000 mg 200 mL/hr         Subjective: Patient seen and examined.  Temperature maximum 102.7.  She complains of mild global headache associated with temperature.  Back pain has improved.  Denies any nausea vomiting.  Waiting for procedure. Denies any cough or sputum production.  She is well aware about procedure.  Objective: Vitals:   04/25/20 0302 04/25/20 0651 04/25/20 0905 04/25/20 1129  BP: 136/82 (!) 146/88 128/77 115/84  Pulse: 91 (!) 109 (!) 102 85  Resp: 20 20 (!) 24 18  Temp: 99.3 F (37.4 C) (!) 102.7 F (39.3 C) 98.8 F (37.1 C) 98.4 F (36.9 C)  TempSrc: Oral Oral Oral   SpO2: 97% 94% 96% 93%  Weight:      Height:        Intake/Output Summary (Last 24 hours) at 04/25/2020 1137 Last data filed at 04/25/2020 0700 Gross per 24 hour  Intake 3217.4 ml  Output 1550 ml  Net 1667.4 ml   Filed Weights   04/21/20 1819 04/22/20 1700 04/23/20 2102  Weight: 99.8 kg 100.7 kg 102.8 kg    Examination:  General exam: Appears calm and comfortable, on room air. Respiratory system: Clear to auscultation. Respiratory effort normal.  No added sounds. A Port-A-Cath on the left chest. Cardiovascular system: S1 & S2 heard, RRR. Gastrointestinal system: Abdomen is nondistended, soft and nontender. No organomegaly or masses felt. Normal bowel  sounds heard. Central nervous system: Alert and oriented. No focal neurological deficits. Extremities: Symmetric 5 x 5 power. Skin: No rashes, lesions or ulcers Psychiatry: Judgement and insight appear normal. Mood & affect appropriate.     Data Reviewed: I have personally reviewed following labs and imaging studies  CBC: Recent Labs  Lab 04/21/20 1935 04/22/20 0508 04/23/20 0621 04/24/20 0928 04/25/20 0552  WBC 9.5 8.5 7.4 6.7 10.6*  NEUTROABS 6.4  --   --   --   --   HGB 10.2* 9.6* 9.3* 9.8* 10.4*  HCT 32.7* 30.5* 29.5* 31.9* 32.8*  MCV 85.6 85.4 85.0 85.3  84.5  PLT 238 228 200 176 132   Basic Metabolic Panel: Recent Labs  Lab 04/21/20 1935 04/22/20 0508 04/23/20 0621 04/24/20 0928 04/25/20 0552  NA 137 139 137 141 140  K 2.9* 3.3* 3.6 4.1 4.1  CL 100 104 105 109 105  CO2 27 25 22 23  21*  GLUCOSE 105* 117* 119* 121* 103*  BUN 10 10 10 11 9   CREATININE 0.87 0.67 0.70 0.73 0.68  CALCIUM 8.8* 8.6* 8.4* 8.7* 9.0  MG  --   --  1.6* 1.8  --    GFR: Estimated Creatinine Clearance: 96.2 mL/min (by C-G formula based on SCr of 0.68 mg/dL). Liver Function Tests: Recent Labs  Lab 04/21/20 1935 04/23/20 0621 04/24/20 0928 04/25/20 0552  AST 36 81* 74* 78*  ALT 31 79* 91* 111*  ALKPHOS 57 50 46 62  BILITOT 0.8 0.7 0.5 0.5  PROT 7.5 6.6 6.3* 7.3  ALBUMIN 2.9* 2.4* 2.1* 2.4*   No results for input(s): LIPASE, AMYLASE in the last 168 hours. No results for input(s): AMMONIA in the last 168 hours. Coagulation Profile: Recent Labs  Lab 04/21/20 1935  INR 1.3*   Cardiac Enzymes: No results for input(s): CKTOTAL, CKMB, CKMBINDEX, TROPONINI in the last 168 hours. BNP (last 3 results) No results for input(s): PROBNP in the last 8760 hours. HbA1C: No results for input(s): HGBA1C in the last 72 hours. CBG: Recent Labs  Lab 04/24/20 2102 04/24/20 2126 04/25/20 0646 04/25/20 0906 04/25/20 1128  GLUCAP 52* 79 79 101* 101*   Lipid Profile: No results for  input(s): CHOL, HDL, LDLCALC, TRIG, CHOLHDL, LDLDIRECT in the last 72 hours. Thyroid Function Tests: No results for input(s): TSH, T4TOTAL, FREET4, T3FREE, THYROIDAB in the last 72 hours. Anemia Panel: No results for input(s): VITAMINB12, FOLATE, FERRITIN, TIBC, IRON, RETICCTPCT in the last 72 hours. Sepsis Labs: Recent Labs  Lab 04/21/20 1935 04/21/20 2135 04/22/20 0508 04/23/20 0621  PROCALCITON  --   --  0.69 0.38  LATICACIDVEN 0.8 0.8  --   --     Recent Results (from the past 240 hour(s))  Urine culture     Status: Abnormal   Collection Time: 04/21/20  6:44 PM   Specimen: In/Out Cath Urine  Result Value Ref Range Status   Specimen Description   Final    IN/OUT CATH URINE Performed at [facility], [address], [place], [place] [address]    Special Requests   Final    NONE Performed at [facility], [address], [place], [place] [address]    Culture MULTIPLE SPECIES PRESENT, SUGGEST RECOLLECTION (A)  Final   Report Status 04/23/2020 FINAL  Final  Blood Culture (routine x 2)     Status: None (Preliminary result)   Collection Time: 04/21/20  7:35 PM   Specimen: Right Antecubital; Blood  Result Value Ref Range Status   Specimen Description RIGHT ANTECUBITAL  Final   Special Requests   Final    BOTTLES DRAWN AEROBIC AND ANAEROBIC Blood Culture adequate volume   Culture   Final    NO GROWTH 4 DAYS Performed at [facility], [address], [place], [place] [address]    Report Status PENDING  Incomplete  Blood Culture (routine x 2)     Status: None (Preliminary result)   Collection Time: 04/21/20  8:05 PM   Specimen: Porta Cath; Blood  Result Value Ref Range Status   Specimen Description PORTA CATH DRAWN BY RN  Final   Special Requests  Final    BOTTLES DRAWN AEROBIC AND ANAEROBIC Blood Culture adequate volume   Culture   Final    NO GROWTH 4 DAYS Performed at [facility], [address], [place], [place] [address]    Report Status PENDING  Incomplete   Respiratory Panel by RT PCR (Flu A&B, Covid) - Nasopharyngeal Swab     Status: None   Collection Time: 04/21/20  9:18 PM   Specimen: Nasopharyngeal Swab  Result Value Ref Range Status   SARS Coronavirus 2 by RT PCR NEGATIVE NEGATIVE Final    Comment: (NOTE) SARS-CoV-2 target nucleic acids are NOT DETECTED. The SARS-CoV-2 RNA is generally detectable in upper respiratoy specimens during the acute phase of infection. The lowest concentration of SARS-CoV-2 viral copies this assay can detect is 131 copies/mL. A negative result does not preclude SARS-Cov-2 infection and should not be used as the sole basis for treatment or other patient management decisions. A negative result may occur with  improper specimen collection/handling, submission of specimen other than nasopharyngeal swab, presence of viral mutation(s) within the areas targeted by this assay, and inadequate number of viral copies (<131 copies/mL). A negative result must be combined with clinical observations, patient history, and epidemiological information. The expected result is Negative. Fact Sheet for Patients:  PinkCheek.be Fact Sheet for Healthcare Providers:  GravelBags.it This test is not yet ap proved or cleared by the [place] FDA and  has been authorized for detection and/or diagnosis of SARS-CoV-2 by FDA under an Emergency Use Authorization (EUA). This EUA will remain  in effect (meaning this test can be used) for the duration of the COVID-19 declaration under Section 564(b)(1) of the Act, 21 U.S.C. section 360bbb-3(b)(1), unless the authorization is terminated or revoked sooner.    Influenza A by PCR NEGATIVE NEGATIVE Final   Influenza B by PCR NEGATIVE NEGATIVE Final    Comment: (NOTE) The Xpert Xpress SARS-CoV-2/FLU/RSV assay is intended as an aid in  the diagnosis of influenza from Nasopharyngeal swab specimens and  should not be used as a sole  basis for treatment. Nasal washings and  aspirates are unacceptable for Xpert Xpress SARS-CoV-2/FLU/RSV  testing. Fact Sheet for Patients: PinkCheek.be Fact Sheet for Healthcare Providers: GravelBags.it This test is not yet approved or cleared by the [place] FDA and  has been authorized for detection and/or diagnosis of SARS-CoV-2 by  FDA under an Emergency Use Authorization (EUA). This EUA will remain  in effect (meaning this test can be used) for the duration of the  Covid-19 declaration under Section 564(b)(1) of the Act, 21  U.S.C. section 360bbb-3(b)(1), unless the authorization is  terminated or revoked. Performed at [facility], [address] [place], [place] [address]   MRSA PCR Screening     Status: None   Collection Time: 04/24/20 12:19 AM  Result Value Ref Range Status   MRSA by PCR NEGATIVE NEGATIVE Final    Comment:        The GeneXpert MRSA Assay (FDA approved for NASAL specimens only), is one component of a comprehensive MRSA colonization surveillance program. It is not intended to diagnose MRSA infection nor to guide or monitor treatment for MRSA infections. Performed at [facility], [place] [address] [place], [place] [address]          Radiology Studies: No results found.      Scheduled Meds: . azithromycin  500 mg Oral Daily  . Chlorhexidine Gluconate Cloth  6 each Topical Daily  . enoxaparin (LOVENOX) injection  40 mg Subcutaneous QHS  . insulin aspart  0-5 Units Subcutaneous QHS  . insulin aspart  0-9 Units Subcutaneous TID WC  . metoprolol tartrate  25 mg Oral BID  . montelukast  10 mg Oral QHS  . rosuvastatin  10 mg Oral Daily   Continuous Infusions: . sodium chloride    . 0.9 % NaCl with KCl 20 mEq / L 100 mL/hr at 04/25/20 0656  . ceFEPime (MAXIPIME) IV 2 g (04/25/20 0923)  . vancomycin 1,000 mg (04/25/20 1046)     LOS: 4 days    Time spent: 25  minutes    Barb Merino, MD [facility] Pager [phone]

## 2020-04-25 NOTE — Op Note (Signed)
  Name:  [name] MRN:  [id number] DOB:  08/05/1961  PROCEDURE NOTE  Procedure(s): Flexible bronchoscopy [phone]) Brushing [phone]) of the RLL Bronchial alveolar lavage (18299) of the LLL & RLL Transbronchial lung biopsy, single lobe (37169) of the RLL Transbronchial lung biopsy, additional lobe (67893) of the RML   Indications: Multiple tiny pulmonary nodules  Consent:  Written informed consent was obtained prior to the procedure. The risks of the procedure including coughing, bleeding and the small chance of lung puncture requiring chest tube were discussed in great detail. The benefits & alternatives including serial follow up were also discussed.  Anesthesia:  General endotracheal.  Procedure summary:  Appropriate equipment was assembled.  The patient was  identified as Megan Mcknight. Interim history obtained and brought to the endoscopy room. Safety timeout was performed. The patient was placed supine on the operating table, airway established and general anesthesia administered by Anesthesia team.   After the appropriate level of anesthesia was assured, flexible video bronchoscope was lubricated and inserted through the endotracheal tube.    Airway examination was performed bilaterally to subsegmental level.  Minimal clear secretions were noted, mucosa appeared normal except for mild nodularity in the mainstem bronchi and no endobronchial lesions were identified.  BAL was obtained from left lower lobe and right lower lobe.  Brushings were obtained from right lower lobe and [place] radiology of right mainstem.  Transbronchial biopsies were obtained from right lower lobe and right middle lobe x6-8.  After ensuring hemostasis , the bronchoscope was withdrawn.  The patient was extubated in the endoscopy room and transferred to recovery Post-procedure chest x-ray was ordered.  Specimens sent: Bronchial alveolar lavage specimen of the LLL/RLL for  microbiology and  cytology. Brushings from right lower lobe and mainstem for cytology Transbronchial biopsies for pathology  Complications:  No immediate complications were noted.  Hemodynamic parameters and oxygenation remained stable throughout the procedure.  Estimated blood loss:  Less then 5 mL.   Kara Mead MD. Shade Flood. [facility] Pager [phone] If no response call [phone]   04/25/2020 3:09 PM

## 2020-04-25 NOTE — Progress Notes (Signed)
IP PROGRESS NOTE  Subjective:   Ms. Ungar known to me with history of relapsed T-cell lymphoma.  She is currently receiving salvage chemotherapy with Adcetris.  She was hospitalized with back pain and cough.  CT scan on Apr 22, 2020 showed bilateral pulmonary nodules that were not present on her PET scan on March 16.  No adenopathy was noted at that time.  She was treated with broad-spectrum antibiotics and underwent bronchoscopy today.  Clinically, she still have some symptoms of shortness of breath and cough although some of it is related to her recent bronchoscopy.  Objective:  Vital signs in last 24 hours: Temp:  [98.4 F (36.9 C)-103.2 F (39.6 C)] 98.4 F (36.9 C) (05/12 1129) Pulse Rate:  [85-114] 85 (05/12 1129) Resp:  [16-30] 18 (05/12 1129) BP: (113-170)/(75-89) 115/84 (05/12 1129) SpO2:  [93 %-100 %] 93 % (05/12 1129) Weight change:  Last BM Date: 04/24/20  Intake/Output from previous day: 05/11 0701 - 05/12 0700 In: 3982.4 [P.O.:702; I.V.:2580.4; IV Piggyback:700] Out: 1550 [Urine:1550] General: Alert, awake without distress. Head: Normocephalic atraumatic. Mouth: mucous membranes moist, pharynx normal without lesions Eyes: No scleral icterus.  Pupils are equal and round reactive to light. Resp: clear to auscultation bilaterally without rhonchi or wheezes or dullness to percussion. Cardio: regular rate and rhythm, S1, S2 normal, no murmur, click, rub or gallop GI: soft, non-tender; bowel sounds normal; no masses,  no organomegaly Musculoskeletal: No joint deformity or effusion. Neurological: No motor, sensory deficits.  Intact deep tendon reflexes. Skin: No rashes or lesions.  Portacath without erythema  Lab Results: Recent Labs    04/24/20 0928 04/25/20 0552  WBC 6.7 10.6*  HGB 9.8* 10.4*  HCT 31.9* 32.8*  PLT 176 217    BMET Recent Labs    04/24/20 0928 04/25/20 0552  NA 141 140  K 4.1 4.1  CL 109 105  CO2 23 21*  GLUCOSE 121* 103*  BUN 11 9   CREATININE 0.73 0.68  CALCIUM 8.7* 9.0    Studies/Results: No results found.  Medications: I have reviewed the patient's current medications.  Assessment/Plan:  59 year old woman with:  1.  T-cell lymphoma diagnosed in 2014 and subsequently developed relapsed disease in December 2020.  She has CD30 fever and currently receiving Adcetris.  She had an excellent response to therapy based on PET CT scan that showed near complete response.  Chemotherapy is scheduled for Apr 26, 2020 will be on hold for the time being.   2.  Bilateral pulmonary nodules: Unclear etiology.  The differential diagnosis including infectious process versus relapsed disease.  He status post endoscopy and a biopsy today.  We will await the results of the biopsy results before further recommendations at this time.  3.  Presumed sepsis: I agree with the current management with intravenous antibiotics spectrum coverage.  Infectious disease involvement might be needed pending the results of her biopsy.   We will continue to follow her progress while she is hospitalized.  15  minutes were dedicated to this encounter.  More than 50% of the time was spent face-to-face was spent on reviewing , imaging studies, discussing treatment options, discussing differential diagnosis and answering questions regarding future plan.    LOS: 4 days   Zola Button 04/25/2020, 1:51 PM

## 2020-04-25 NOTE — Anesthesia Procedure Notes (Signed)
Procedure Name: Intubation Date/Time: 04/25/2020 2:38 PM Performed by: Amadeo Garnet, CRNA Pre-anesthesia Checklist: Patient identified, Emergency Drugs available, Suction available and Patient being monitored Patient Re-evaluated:Patient Re-evaluated prior to induction Oxygen Delivery Method: Circle system utilized Preoxygenation: Pre-oxygenation with 100% oxygen Induction Type: IV induction Ventilation: Mask ventilation without difficulty Laryngoscope Size: Mac and 4 Grade View: Grade I Tube type: Oral Tube size: 8.5 mm Number of attempts: 1 Airway Equipment and Method: Stylet Placement Confirmation: ETT inserted through vocal cords under direct vision,  positive ETCO2 and breath sounds checked- equal and bilateral Secured at: 21 cm Tube secured with: Tape Dental Injury: Teeth and Oropharynx as per pre-operative assessment

## 2020-04-25 NOTE — Transfer of Care (Signed)
Immediate Anesthesia Transfer of Care Note  Patient: Megan Mcknight  Procedure(s) Performed: VIDEO BRONCHOSCOPY WITH FLUORO (N/A )  Patient Location: Endoscopy Unit  Anesthesia Type:General  Level of Consciousness: awake, alert  and oriented  Airway & Oxygen Therapy: Patient Spontanous Breathing and Patient connected to face mask oxygen  Post-op Assessment: Report given to RN, Post -op Vital signs reviewed and stable and Patient moving all extremities  Post vital signs: Reviewed and stable  Last Vitals:  Vitals Value Taken Time  BP 141/79 04/25/20 1519  Temp    Pulse 110 04/25/20 1523  Resp 20 04/25/20 1523  SpO2 97 % 04/25/20 1523  Vitals shown include unvalidated device data.  Last Pain:  Vitals:   04/25/20 1410  TempSrc: Oral  PainSc: 0-No pain         Complications: No apparent anesthesia complications

## 2020-04-25 NOTE — Plan of Care (Signed)
  Problem: Activity: Goal: Risk for activity intolerance will decrease Outcome: Progressing   Problem: Nutrition: Goal: Adequate nutrition will be maintained Outcome: Progressing   

## 2020-04-25 NOTE — Progress Notes (Addendum)
Hypoglycemic Event  CBG: 69  Treatment: 4 oz juice/soda  Symptoms: None  Follow-up CBG: Time: 2102 CBG Result: 52  Treatment: Gave 2 tubes of glucose gel   CBG follow-up Time: 2126            CBG result: 79  Possible Reasons for Event: Inadequate meal intake       Megan Mcknight Megan Mcknight  [name]

## 2020-04-25 NOTE — Progress Notes (Signed)
Inpatient Diabetes Program Recommendations  AACE/ADA: New Consensus Statement on Inpatient Glycemic Control (2015)  Target Ranges:  Prepandial:   less than 140 mg/dL      Peak postprandial:   less than 180 mg/dL (1-2 hours)      Critically ill patients:  140 - 180 mg/dL   Lab Results  Component Value Date   GLUCAP 101 (H) 04/25/2020   HGBA1C 7.4 (H) 04/22/2020    Review of Glycemic Control Results for Megan Mcknight, Megan Mcknight (MRN [id number]) as of 04/25/2020 10:46  Ref. Range 04/24/2020 21:02 04/24/2020 21:26 04/25/2020 06:46 04/25/2020 09:06  Glucose-Capillary Latest Ref Range: 70 - 99 mg/dL 52 (L) 79 79 101 (H)   Diabetes history: DM 2 Outpatient Diabetes medications:  Lantus 70 units q HS, Metformin 500 mg bid Current orders for Inpatient glycemic control:  Novolog sensitive tid with meals and HS  Inpatient Diabetes Program Recommendations:     Noted continued hypoglycemic events. No insulin administered. However, would recommend decreasing to very sensitive Novolog correction?    Thanks, Bronson Curb, MSN, RNC-OB Diabetes Coordinator [phone] (8a-5p)

## 2020-04-25 NOTE — Progress Notes (Signed)
   04/25/20 0651  Assess: MEWS Score  Temp (!) 102.7 F (39.3 C)  BP (!) 146/88  Pulse Rate (!) 109  Resp 20  Level of Consciousness Alert  SpO2 94 %  O2 Device Room Air  Assess: MEWS Score  MEWS Temp 2  MEWS Systolic 0  MEWS Pulse 1  MEWS RR 0  MEWS LOC 0  MEWS Score 3  MEWS Score Color Yellow  Assess: if the MEWS score is Yellow or Red  Were vital signs taken at a resting state? Yes  Focused Assessment Documented focused assessment  Early Detection of Sepsis Score *See Row Information* Medium  MEWS guidelines implemented *See Row Information* Yes  Treat  MEWS Interventions Administered prn meds/treatments  Take Vital Signs  Increase Vital Sign Frequency  Yellow: Q 2hr X 2 then Q 4hr X 2, if remains yellow, continue Q 4hrs  Escalate  MEWS: Escalate Yellow: discuss with charge nurse/RN and consider discussing with provider and RRT  Notify: Charge Nurse/RN  Name of Charge Nurse/RN Notified Cheryl, RN  Date Charge Nurse/RN Notified 04/25/20  Time Charge Nurse/RN Notified [id number]  Notify: Provider  Provider Name/Title Ghimire, MD   Date Provider Notified 04/25/20  Time Provider Notified [phone]  Notification Type Page  Notification Reason Change in status  Response Other (Comment) (Give PRN tylenol )  Date of Provider Response 04/25/20  Time of Provider Response [phone]

## 2020-04-25 NOTE — Plan of Care (Signed)
  Problem: Education: Goal: Knowledge of General Education information will improve Description Including pain rating scale, medication(s)/side effects and non-pharmacologic comfort measures Outcome: Progressing   

## 2020-04-26 ENCOUNTER — Inpatient Hospital Stay: Payer: BC Managed Care – PPO

## 2020-04-26 ENCOUNTER — Inpatient Hospital Stay: Payer: BC Managed Care – PPO | Admitting: Oncology

## 2020-04-26 ENCOUNTER — Inpatient Hospital Stay (HOSPITAL_COMMUNITY): Payer: BC Managed Care – PPO

## 2020-04-26 ENCOUNTER — Telehealth: Payer: Self-pay

## 2020-04-26 DIAGNOSIS — R509 Fever, unspecified: Secondary | ICD-10-CM

## 2020-04-26 DIAGNOSIS — C8105 Nodular lymphocyte predominant Hodgkin lymphoma, lymph nodes of inguinal region and lower limb: Secondary | ICD-10-CM

## 2020-04-26 LAB — GLUCOSE, CAPILLARY
Glucose-Capillary: 103 mg/dL — ABNORMAL HIGH (ref 70–99)
Glucose-Capillary: 91 mg/dL (ref 70–99)
Glucose-Capillary: 73 mg/dL (ref 70–99)
Glucose-Capillary: 84 mg/dL (ref 70–99)

## 2020-04-26 LAB — ACID FAST SMEAR (AFB, MYCOBACTERIA): Acid Fast Smear: NEGATIVE

## 2020-04-26 LAB — CRYPTOCOCCAL ANTIGEN: Crypto Ag: NEGATIVE

## 2020-04-26 LAB — LACTATE DEHYDROGENASE: LDH: 185 U/L (ref 98–192)

## 2020-04-26 LAB — HEPATITIS C ANTIBODY: HCV Ab: NONREACTIVE

## 2020-04-26 MED ORDER — IOHEXOL 9 MG/ML PO SOLN: 500.0000 mL | ORAL | Status: AC

## 2020-04-26 MED ORDER — IOHEXOL 300 MG/ML  SOLN
100.0000 mL | Freq: Once | INTRAMUSCULAR | Status: AC | PRN
  Administered 2020-04-26: 100 mL via INTRAVENOUS

## 2020-04-26 MED ORDER — LABETALOL HCL 5 MG/ML IV SOLN: 10.0000 mg | INTRAVENOUS | Status: DC | PRN

## 2020-04-26 NOTE — Progress Notes (Addendum)
NAME:  Megan Mcknight, MRN:  [id number], DOB:  Sep 27, 1961, LOS: 5 ADMISSION DATE:  04/21/2020, CONSULTATION DATE:  04/23/2020 REFERRING MD:  Dr. 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  Found to have new b/l innumerable nodules of lung on CT chest from 04/22/20.  History of present illness   59 yo female was diagnosed with lymphoma in June of 2014.  She had autologous stem cell transplant (SCT) in February 2015.  Completed 6 cycles of CHOEP.  She was found to have relapse in 11/ 2020 by left supraclavicular lymph node biopsy.  She was started on adcetris in December 2020.  Only apparent side effect was neuropathy.  She had PET scan on 02/28/20  that showed near complete response in adenopathy and no lung nodules.   She was working in her yard about 2 weeks prior to admission.  She developed back pain and presented to the ER.  She also developed a cough.  In ER she was found to have fever 102.62F and CXR showed b/l ASD.  She was started on broad spectrum antibiotics.  She continued to have fevers.  She had CT chest on 04/22/20 that showed new b/l innumerable lung nodules.    About one week prior to admission she noticed feeling more fatigued and having sweats.  She wasn't aware of having.  She though her cough was related to allergies.  She had TB testing in 2014 prior to starting therapy for lymphoma and this was negative.  She is from [place].  No recent travel or sick exposures.  Denies any animal/bird exposures.  Past Medical History  HTN, DM, [place] [facility] Events   5/08 Admit  Consults:    Procedures:    Significant Diagnostic Tests:  CT chest 04/22/20 >> new diffuse b/l innumerable pulmonary nodules ranging in size from 2 to 10 mm  Micro Data:  SARS CoV2  PCR 5/08 >> negative Blood 5/08 >>ng HIV 5/09 >> nonreactive Legionella 5/09 >>  Histoplasma Ag 5/10 >> Aspergillus Ab 5/10 >>  Quantiferon gold 5/10 >> neg Fungitell >>> neg   Antimicrobials:  Rocephin 5/08 Zithromax 5/08 >> Vancomycin 5/09 >> Cefepime 5/09 >>  Interim history/subjective:  Febrile 103 Denies chills or cough   Objective   Blood pressure 131/81, pulse 83, temperature 98.7 F (37.1 C), temperature source Oral, resp. rate 18, height 5\' 8"  (1.727 m), weight 102.8 kg, SpO2 100 %.        Intake/Output Summary (Last 24 hours) at 04/26/2020 1211 Last data filed at 04/26/2020 0900 Gross per 24 hour  Intake 2642.1 ml  Output 1150 ml  Net 1492.1 ml   Filed Weights   04/21/20 1819 04/22/20 1700 04/23/20 2102  Weight: 99.8 kg 100.7 kg 102.8 kg    Examination:  Gen. Pleasant, obese, in no distress, normal affect ENT - no pallor,icterus, no post nasal drip Neck: No JVD, no thyromegaly, no carotid bruits Lungs: no use of accessory muscles, no dullness to percussion, decreased without rales or rhonchi  Cardiovascular: Rhythm regular, S1S2  normal, no murmurs or gallops, no peripheral edema Abdomen: soft and non-tender, no hepatosplenomegaly, BS normal. Musculoskeletal: No deformities, no cyanosis or clubbing Neuro:  alert, non focal, no tremors     Discussion:  59 yo female  with new fever, cough found to have innumerable pulmonary nodules on CT chest.  She has hx of T cell lymphoma, relapse in 11/2019 on  adcetris.  Her PET scan from March 2021 did not show these nodules.  Assessment & Plan:   FUO Multiple Lung nodules.  - continue current ABx for now -Await transbronchial biopsy results and microbiology data from bronchoscopy , if nondiagnostic would proceed with CT-guided biopsy of lung nodule -Appreciate ID input   Kara Mead MD. FCCP. [facility]  If no response to pager , please call [phone]   04/26/2020

## 2020-04-26 NOTE — Progress Notes (Signed)
PROGRESS NOTE    Megan Mcknight  PZW:[id number] DOB: [birth year] DOA: 04/21/2020 PCP: Rosita Fire, MD    Brief Narrative:  59 year old female with history of anaplastic T-cell lymphoma diagnosed in 2014, status post stem cell transplant in 2015, relapse on 11/2019 and currently on Adcetris followed by Dr. Alen Blew, type 2 diabetes on insulin, hyperlipidemia presented to the emergency room with about 2-week history of right lower back pain.  She was found to have temperature of 102.9.  Sepsis work-up initiated.  CT chest showed new innumerable pulmonary nodules.  For bronchoscopy and further diagnostic, she was transferred to [facility].   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  Present on admission.  In the setting of immunocompromised state. Hemodynamically stabilizing. Patient on vancomycin, cefepime and azithromycin.  CT of the chest showed rapid development of bilateral pulmonary nodule pulmonary metastasis lesions, nodules not present on PET scan on 3/16. Cultures are negative so far.  QuantiFERON test pending.  Legionella and fungal panel pending. Status post bronc biopsy and lavage.  Continue antibiotics until final culture and improvement. Case discussed with infectious disease, seen in consultation.  Ordering CT scan of the abdomen pelvis.  Type 2 diabetes with hyperglycemia.  Hypoglycemic episodes in the hospital.  Holding all long-acting insulin, Metformin and keeping on sensitive sliding scale.  We will  continue low-dose insulin until she has a good appetite.  Lower back pain: Negative CT scan.  Improving.  Urine culture with multiple growth.  Anaplastic T-cell lymphoma on chemotherapy: Last chemo 4/22.  Followed by her oncologist.  Hypertension: Stable.  On metoprolol and Norvasc.   DVT prophylaxis: Lovenox subcu Code Status: Full code Family Communication: None Disposition Plan: Status is: Inpatient  Remains inpatient appropriate because:Inpatient level of care appropriate due to severity of illness   Dispo: The patient is from: Home              Anticipated d/c is to: Home              Anticipated d/c date is: 2 days              Patient currently is not medically stable to d/c.          Consultants:   PCCM  Oncology  Infectious disease  Procedures:   None  Antimicrobials:  Antibiotics Given (last 72 hours)    Date/Time Action Medication Dose Rate   04/23/20 1146 New Bag/Given   vancomycin (VANCOCIN) IVPB 1000 mg/200 mL premix 1,000 mg 200 mL/hr   04/23/20 1720 New Bag/Given   ceFEPIme (MAXIPIME) 2 g in sodium chloride 0.9 % 100 mL IVPB 2 g 200 mL/hr   04/23/20 2232 Given   azithromycin (ZITHROMAX) tablet 500 mg 500 mg    04/24/20 0016 New Bag/Given   vancomycin (VANCOCIN) IVPB 1000 mg/200 mL premix 1,000 mg 200 mL/hr   04/24/20 0121 New Bag/Given   ceFEPIme (MAXIPIME) 2 g in sodium chloride 0.9 % 100 mL IVPB 2 g 200 mL/hr  04/24/20 0840 New Bag/Given   ceFEPIme (MAXIPIME) 2 g in sodium chloride 0.9 % 100 mL IVPB 2 g 200 mL/hr   04/24/20 1231 New Bag/Given   vancomycin (VANCOCIN) IVPB 1000 mg/200 mL premix 1,000 mg 200 mL/hr   04/24/20 1608 New Bag/Given   ceFEPIme (MAXIPIME) 2 g in sodium chloride 0.9 % 100 mL IVPB 2 g 200 mL/hr   04/24/20 2102 Given   azithromycin (ZITHROMAX) tablet 500 mg 500 mg    04/24/20 2304 New Bag/Given   vancomycin (VANCOCIN) IVPB 1000 mg/200 mL premix 1,000 mg 200 mL/hr   04/25/20 0012 New Bag/Given   ceFEPIme  (MAXIPIME) 2 g in sodium chloride 0.9 % 100 mL IVPB 2 g 200 mL/hr   04/25/20 3300 New Bag/Given   ceFEPIme (MAXIPIME) 2 g in sodium chloride 0.9 % 100 mL IVPB 2 g 200 mL/hr   04/25/20 1046 New Bag/Given   vancomycin (VANCOCIN) IVPB 1000 mg/200 mL premix 1,000 mg 200 mL/hr   04/25/20 2234 Given   azithromycin (ZITHROMAX) tablet 500 mg 500 mg    04/25/20 2242 New Bag/Given   vancomycin (VANCOCIN) IVPB 1000 mg/200 mL premix 1,000 mg 200 mL/hr   04/25/20 2352 New Bag/Given   ceFEPIme (MAXIPIME) 2 g in sodium chloride 0.9 % 100 mL IVPB 2 g 200 mL/hr   04/26/20 0801 New Bag/Given   ceFEPIme (MAXIPIME) 2 g in sodium chloride 0.9 % 100 mL IVPB 2 g 200 mL/hr         Subjective: Patient seen and examined.  Has some dry cough after procedure.  Denies any other complaints.  No more headache.  Temperature maximum 103.  Denies any abdominal pain.  Objective: Vitals:   04/25/20 2337 04/26/20 0346 04/26/20 0546 04/26/20 0906  BP: 119/68 (!) 142/76 114/67 131/81  Pulse: 99 (!) 102 93 83  Resp: 20 20 20 18   Temp: (!) 100.6 F (38.1 C) (!) 103 F (39.4 C) 99.1 F (37.3 C) 98.7 F (37.1 C)  TempSrc: Oral Oral Oral Oral  SpO2: 99% 97% 97% 100%  Weight:      Height:        Intake/Output Summary (Last 24 hours) at 04/26/2020 1000 Last data filed at 04/26/2020 0600 Gross per 24 hour  Intake 2422.1 ml  Output 1150 ml  Net 1272.1 ml   Filed Weights   04/21/20 1819 04/22/20 1700 04/23/20 2102  Weight: 99.8 kg 100.7 kg 102.8 kg    Examination:  General exam: Appears calm and comfortable, on room air. Respiratory system: Clear to auscultation. Respiratory effort normal.  No added sounds. A Port-A-Cath on the left chest. Cardiovascular system: S1 & S2 heard, RRR. Gastrointestinal system: Abdomen is nondistended, soft and nontender. No organomegaly or masses felt. Normal bowel sounds heard. Central nervous system: Alert and oriented. No focal neurological deficits. Extremities: Symmetric 5 x  5 power. Skin: No rashes, lesions or ulcers Psychiatry: Judgement and insight appear normal. Mood & affect appropriate.     Data Reviewed: I have personally reviewed following labs and imaging studies  CBC: Recent Labs  Lab 04/21/20 1935 04/22/20 0508 04/23/20 0621 04/24/20 0928 04/25/20 0552  WBC 9.5 8.5 7.4 6.7 10.6*  NEUTROABS 6.4  --   --   --   --   HGB 10.2* 9.6* 9.3* 9.8* 10.4*  HCT 32.7* 30.5* 29.5* 31.9* 32.8*  MCV 85.6 85.4 85.0 85.3 84.5  PLT 238 228 200 176 762   Basic Metabolic Panel: Recent Labs  Lab 04/21/20 1935 04/22/20 2633  04/23/20 0621 04/24/20 0928 04/25/20 0552  NA 137 139 137 141 140  K 2.9* 3.3* 3.6 4.1 4.1  CL 100 104 105 109 105  CO2 27 25 22 23  21*  GLUCOSE 105* 117* 119* 121* 103*  BUN 10 10 10 11 9   CREATININE 0.87 0.67 0.70 0.73 0.68  CALCIUM 8.8* 8.6* 8.4* 8.7* 9.0  MG  --   --  1.6* 1.8  --    GFR: Estimated Creatinine Clearance: 96.2 mL/min (by C-G formula based on SCr of 0.68 mg/dL). Liver Function Tests: Recent Labs  Lab 04/21/20 1935 04/23/20 0621 04/24/20 0928 04/25/20 0552  AST 36 81* 74* 78*  ALT 31 79* 91* 111*  ALKPHOS 57 50 46 62  BILITOT 0.8 0.7 0.5 0.5  PROT 7.5 6.6 6.3* 7.3  ALBUMIN 2.9* 2.4* 2.1* 2.4*   No results for input(s): LIPASE, AMYLASE in the last 168 hours. No results for input(s): AMMONIA in the last 168 hours. Coagulation Profile: Recent Labs  Lab 04/21/20 1935  INR 1.3*   Cardiac Enzymes: No results for input(s): CKTOTAL, CKMB, CKMBINDEX, TROPONINI in the last 168 hours. BNP (last 3 results) No results for input(s): PROBNP in the last 8760 hours. HbA1C: No results for input(s): HGBA1C in the last 72 hours. CBG: Recent Labs  Lab 04/25/20 0906 04/25/20 1128 04/25/20 1651 04/25/20 2217 04/26/20 0646  GLUCAP 101* 101* 88 92 103*   Lipid Profile: No results for input(s): CHOL, HDL, LDLCALC, TRIG, CHOLHDL, LDLDIRECT in the last 72 hours. Thyroid Function Tests: No results for  input(s): TSH, T4TOTAL, FREET4, T3FREE, THYROIDAB in the last 72 hours. Anemia Panel: No results for input(s): VITAMINB12, FOLATE, FERRITIN, TIBC, IRON, RETICCTPCT in the last 72 hours. Sepsis Labs: Recent Labs  Lab 04/21/20 1935 04/21/20 2135 04/22/20 0508 04/23/20 0621  PROCALCITON  --   --  0.69 0.38  LATICACIDVEN 0.8 0.8  --   --     Recent Results (from the past 240 hour(s))  Urine culture     Status: Abnormal   Collection Time: 04/21/20  6:44 PM   Specimen: In/Out Cath Urine  Result Value Ref Range Status   Specimen Description   Final    IN/OUT CATH URINE Performed at [facility], [address], [place], [place] [address]    Special Requests   Final    NONE Performed at [facility], [address] [place], [place] [address]    Culture MULTIPLE SPECIES PRESENT, SUGGEST RECOLLECTION (A)  Final   Report Status 04/23/2020 FINAL  Final  Blood Culture (routine x 2)     Status: None (Preliminary result)   Collection Time: 04/21/20  7:35 PM   Specimen: Right Antecubital; Blood  Result Value Ref Range Status   Specimen Description RIGHT ANTECUBITAL  Final   Special Requests   Final    BOTTLES DRAWN AEROBIC AND ANAEROBIC Blood Culture adequate volume   Culture   Final    NO GROWTH 4 DAYS Performed at [facility], [address], [place], [place] [address]    Report Status PENDING  Incomplete  Blood Culture (routine x 2)     Status: None (Preliminary result)   Collection Time: 04/21/20  8:05 PM   Specimen: Porta Cath; Blood  Result Value Ref Range Status   Specimen Description PORTA CATH DRAWN BY RN  Final   Special Requests   Final    BOTTLES DRAWN AEROBIC AND ANAEROBIC Blood Culture adequate volume   Culture   Final  NO GROWTH 4 DAYS Performed at [facility], [address], [place], [place] [address]    Report Status PENDING  Incomplete  Respiratory Panel by RT PCR (Flu A&B, Covid) - Nasopharyngeal Swab     Status: None   Collection Time: 04/21/20  9:18 PM    Specimen: Nasopharyngeal Swab  Result Value Ref Range Status   SARS Coronavirus 2 by RT PCR NEGATIVE NEGATIVE Final    Comment: (NOTE) SARS-CoV-2 target nucleic acids are NOT DETECTED. The SARS-CoV-2 RNA is generally detectable in upper respiratoy specimens during the acute phase of infection. The lowest concentration of SARS-CoV-2 viral copies this assay can detect is 131 copies/mL. A negative result does not preclude SARS-Cov-2 infection and should not be used as the sole basis for treatment or other patient management decisions. A negative result may occur with  improper specimen collection/handling, submission of specimen other than nasopharyngeal swab, presence of viral mutation(s) within the areas targeted by this assay, and inadequate number of viral copies (<131 copies/mL). A negative result must be combined with clinical observations, patient history, and epidemiological information. The expected result is Negative. Fact Sheet for Patients:  PinkCheek.be Fact Sheet for Healthcare Providers:  GravelBags.it This test is not yet ap proved or cleared by the [place] FDA and  has been authorized for detection and/or diagnosis of SARS-CoV-2 by FDA under an Emergency Use Authorization (EUA). This EUA will remain  in effect (meaning this test can be used) for the duration of the COVID-19 declaration under Section 564(b)(1) of the Act, 21 U.S.C. section 360bbb-3(b)(1), unless the authorization is terminated or revoked sooner.    Influenza A by PCR NEGATIVE NEGATIVE Final   Influenza B by PCR NEGATIVE NEGATIVE Final    Comment: (NOTE) The Xpert Xpress SARS-CoV-2/FLU/RSV assay is intended as an aid in  the diagnosis of influenza from Nasopharyngeal swab specimens and  should not be used as a sole basis for treatment. Nasal washings and  aspirates are unacceptable for Xpert Xpress SARS-CoV-2/FLU/RSV  testing. Fact Sheet  for Patients: PinkCheek.be Fact Sheet for Healthcare Providers: GravelBags.it This test is not yet approved or cleared by the [place] FDA and  has been authorized for detection and/or diagnosis of SARS-CoV-2 by  FDA under an Emergency Use Authorization (EUA). This EUA will remain  in effect (meaning this test can be used) for the duration of the  Covid-19 declaration under Section 564(b)(1) of the Act, 21  U.S.C. section 360bbb-3(b)(1), unless the authorization is  terminated or revoked. Performed at [facility], [address], [place], [place] [address]   MRSA PCR Screening     Status: None   Collection Time: 04/24/20 12:19 AM  Result Value Ref Range Status   MRSA by PCR NEGATIVE NEGATIVE Final    Comment:        The GeneXpert MRSA Assay (FDA approved for NASAL specimens only), is one component of a comprehensive MRSA colonization surveillance program. It is not intended to diagnose MRSA infection nor to guide or monitor treatment for MRSA infections. Performed at [facility], [place] [address], [place], [place] [address]   Culture, respiratory     Status: None (Preliminary result)   Collection Time: 04/25/20  2:46 PM   Specimen: Bronchial Washing, Left; Lung  Result Value Ref Range Status   Specimen Description BRONCHIAL ALVEOLAR LAVAGE LEFT  Final   Special Requests NONE  Final   Gram Stain   Final    FEW WBC PRESENT, PREDOMINANTLY PMN  NO ORGANISMS SEEN    Culture   Final    NO GROWTH < 24 HOURS Performed at [facility], [place] [address], [place], [place] [address]    Report Status PENDING  Incomplete         Radiology Studies: DG CHEST PORT 1 VIEW  Result Date: 04/25/2020 CLINICAL DATA:  Post bronchoscopy EXAM: PORTABLE CHEST 1 VIEW COMPARISON:  CT 04/22/2020, radiograph 04/21/2020 FINDINGS: Redemonstration of the innumerable pulmonary nodules throughout both lungs better seen on  comparison CT from 04/22/2020. There is increasing hazy opacity towards the lung bases and periphery with central vascular cuffing and congestion as well as septal and fissural thickening. No pneumothorax. Small right pleural effusion. No visible left effusion or pneumothorax. Cardiomediastinal contours are stable. Accessed left IJ approach Port-A-Cath tip terminates at the superior cavoatrial junction. Telemetry leads overlie the chest. No acute osseous or soft tissue abnormality. Degenerative changes are present in the imaged spine and shoulders. IMPRESSION: 1. Innumerable pulmonary nodules compatible with metastatic disease throughout both lungs better seen on comparison CT from 04/22/2020. 2. Worsening hazy opacity towards the lung bases and periphery with central vascular cuffing and septal and fissural thickening could suggest worsening edema and some postprocedural atelectasis, now with small right effusion. Electronically Signed   By: Lovena Le M.D.   On: 04/25/2020 18:46   DG C-ARM BRONCHOSCOPY  Result Date: 04/25/2020 C-ARM BRONCHOSCOPY: Fluoroscopy was utilized by the requesting physician.  No radiographic interpretation.        Scheduled Meds: . azithromycin  500 mg Oral Daily  . Chlorhexidine Gluconate Cloth  6 each Topical Daily  . enoxaparin (LOVENOX) injection  40 mg Subcutaneous QHS  . insulin aspart  0-5 Units Subcutaneous QHS  . insulin aspart  0-9 Units Subcutaneous TID WC  . metoprolol tartrate  25 mg Oral BID  . montelukast  10 mg Oral QHS  . rosuvastatin  10 mg Oral Daily  . sodium chloride flush  10-40 mL Intracatheter Q12H   Continuous Infusions: . sodium chloride    . ceFEPime (MAXIPIME) IV 2 g (04/26/20 0801)     LOS: 5 days    Time spent: 25 minutes    Barb Merino, MD [facility] Pager [phone]

## 2020-04-26 NOTE — Progress Notes (Signed)
   04/26/20 2046  Assess: MEWS Score  Temp (!) 102.6 F (39.2 C)  BP (!) 184/100  Pulse Rate (!) 124  Resp (!) 38  Level of Consciousness Alert  SpO2 97 %  O2 Device Nasal Cannula  O2 Flow Rate (L/min) 3 L/min  Assess: MEWS Score  MEWS Temp 2  MEWS Systolic 0  MEWS Pulse 2  MEWS RR 3  MEWS LOC 0  MEWS Score 7  MEWS Score Color Red  Assess: if the MEWS score is Yellow or Red  Were vital signs taken at a resting state? Yes  Focused Assessment Documented focused assessment  Early Detection of Sepsis Score *See Row Information* High  MEWS guidelines implemented *See Row Information* Yes  Treat  MEWS Interventions Administered scheduled meds/treatments;Administered prn meds/treatments;Escalated (See documentation below)  Take Vital Signs  Increase Vital Sign Frequency  Red: Q 1hr X 4 then Q 4hr X 4, if remains red, continue Q 4hrs  Escalate  MEWS: Escalate Red: discuss with charge nurse/RN and provider, consider discussing with RRT  Notify: Charge Nurse/RN  Name of Charge Nurse/RN Notified Rodena Piety, RN  Date Charge Nurse/RN Notified 04/26/20  Notify: Provider  Provider Name/Title Opyd, MD  Date Provider Notified 04/26/20  Time Provider Notified 2100  Notification Type Page  Notification Reason Change in status  Response See new orders  Date of Provider Response 04/26/20  Time of Provider Response 2115  Notify: Rapid Response  Name of Rapid Response RN Notified Mindy, RN  Date Rapid Response Notified 04/26/20  Time Rapid Response Notified 2100

## 2020-04-26 NOTE — Consult Note (Signed)
Date of Admission:  04/21/2020          Reason for Consult: Fever of unknown origin    Referring Provider: Dr Sloan Leiter   Assessment:  1. Fevers 2. Multiple nodular opacities in lungs = lymphoma vs infection (seems less likely) 3. Low back pain that has resolved with negative CT L spine 4. Hx of T cell lymphoma diagnosed in 2014, remission until relapse in December 2020 on salvage therapy with Adcetris 5. Hx of partial hysterectomy  Plan:  1. CT abdomen and pelvis w contrast 2. DC vancomycin 3. If CT abdomen negative then will DC her antimicrobials 4. We will send serum cryptococcal antigen as well as a urine Blastomyces and histoplasma antigen 5. We will do serological work-up for FUO including EBV CMV serologies hepatitis serologies 6. 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 azithromycin  500 mg Oral Daily  . Chlorhexidine Gluconate Cloth  6 each Topical Daily  . enoxaparin (LOVENOX) injection  40 mg Subcutaneous QHS  . insulin aspart  0-5 Units Subcutaneous QHS  . insulin aspart  0-9 Units Subcutaneous TID WC  . metoprolol tartrate  25 mg Oral BID  . montelukast  10 mg Oral QHS  . rosuvastatin  10 mg Oral Daily  . sodium chloride flush  10-40 mL Intracatheter Q12H   Continuous Infusions: . sodium chloride    . ceFEPime (MAXIPIME) IV 2 g (04/26/20 0801)   PRN Meds:.sodium chloride, acetaminophen, albuterol, ibuprofen, morphine  injection, prochlorperazine, sodium chloride flush  HPI: Megan Mcknight is a 59 y.o. female with T-cell lymphoma that was in remission until relapse in 2020 currently on salvage chemotherapy and followed by Dr. Alen Blew.  Apparently been responding well to therapy.  Her she was most recently treated in April and was scheduled for another dose on 13 May.  In the interim she developed severe intractable low back pain after riding her lawnmower.  She came to the ER and also had a cough and was found to be febrile over 103 degrees.  Chest x-ray had shown some interstitial infiltrates and she had blood cultures taken and was started on broad-spectrum antibiotics.  CT scan of the lungs shows multiple nodules throughout the lungs which radiology were concerned could represent malignancy.  She has undergone bronchoscopy with BAL and biopsies.  Bacterial cultures are without growth of any pathogen.  Her serum QuantiFERON gold was negative and her Fungitell test was negative.  I do not think this is at all a bacterial process.  For re-assurances we will get a CT of the abdomen and pelvis to exclude intra-abdominal infection and if there is none seen I will discontinue her antibacterial antimicrobials I am stopping the vancomycin already.  Send a serum cryptococcal antigen along with a urine and blood Blastomyces antigen and histoplasma antigen.  I think if certainty is desired she will need a more invasive biopsy if the biopsies done by Dr.  Elsworth Soho are not conclusive.  Dr. Elsworth Soho thinks that an IR guided biopsy would be the next step that could be taken.     Review of Systems: Review of Systems  Constitutional: Positive for fever. Negative for chills, diaphoresis, malaise/fatigue and weight loss.  HENT: Negative for congestion, hearing loss, sore throat and tinnitus.   Eyes: Negative for blurred vision and double vision.  Respiratory: Negative for cough, sputum production, shortness of breath and  wheezing.   Cardiovascular: Negative for chest pain, palpitations and leg swelling.  Gastrointestinal: Negative for abdominal pain, blood in stool, constipation, diarrhea, heartburn, melena, nausea and vomiting.  Genitourinary: Negative for dysuria, flank pain and hematuria.  Musculoskeletal: Positive for back pain. Negative for falls, joint pain and myalgias.  Skin: Negative for itching and rash.  Neurological: Negative for dizziness, sensory change, focal weakness, loss of consciousness, weakness and headaches.  Endo/Heme/Allergies: Does not bruise/bleed easily.  Psychiatric/Behavioral: Negative for depression, memory loss and suicidal ideas. The patient is not nervous/anxious.     Past Medical History:  Diagnosis Date  . Acute bronchitis   . Anaplastic large cell lymphoma 2014   remission since 2015  . Asthma   . Cancer ([place])    lung and kidney  . Colon polyps 02/2013   Per colonoscopy  . Coma ([place])   . Diabetes mellitus without complication ([place])   . Diverticulosis 02/2013   Per colonoscopy  . Dysrhythmia   . Former light tobacco smoker   . GERD (gastroesophageal reflux disease)   . H/O stem cell transplant ([place]) 2015  . Hyperlipidemia   . Nerve pain    Left leg  . Non Hodgkin's lymphoma ([place])   . Seasonal allergies     Social History   Tobacco Use  . Smoking status: Former Smoker    Packs/day: 0.50    Types: Cigarettes    Quit date: 04/19/2013    Years since quitting: 7.0  . Smokeless tobacco: Never Used  Substance Use Topics  . Alcohol use: No  . Drug use: No    Family History  Problem Relation Age of Onset  . Colon polyps Mother   . Hypertension Mother   . Heart disease Mother   . Hyperlipidemia Mother   . Cancer Father        prostate  . Hypertension Father   . Hyperlipidemia Father   . Heart defect Other        family history   . Asthma Other        family history    Allergies  Allergen Reactions  . Iodine Anaphylaxis    Told to avoid due to  shellfish allergy. Told to avoid due to shellfish allergy.  Marland Kitchen Shellfish-Derived Products Anaphylaxis and Swelling    Eyes swelling, scratchy throat, bumps on lips.    OBJECTIVE: Blood pressure 131/81, pulse 83, temperature 98.7 F (37.1 C), temperature source Oral, resp. rate 18, height _0  (1.727 m), weight 102.8 kg, SpO2 100 %.  Physical Exam Constitutional:      General: She is not in acute distress.    Appearance: She is not diaphoretic.  HENT:     Head: Normocephalic and atraumatic.     Right Ear: External ear normal.     Left Ear: External ear normal.     Nose: Nose normal.     Mouth/Throat:     Pharynx: No oropharyngeal exudate.  Eyes:     General: No scleral icterus.    Conjunctiva/sclera: Conjunctivae normal.  Pupils: Pupils are equal, round, and reactive to light.  Cardiovascular:     Rate and Rhythm: Normal rate and regular rhythm.     Heart sounds: Normal heart sounds. No murmur. No friction rub. No gallop.   Pulmonary:     Effort: Pulmonary effort is normal. No respiratory distress.     Breath sounds: Normal breath sounds. No wheezing or rales.  Abdominal:     General: Bowel sounds are normal. There is no distension.     Palpations: Abdomen is soft.     Tenderness: There is no abdominal tenderness. There is no rebound.  Musculoskeletal:        General: No tenderness. Normal range of motion.     Cervical back: Normal range of motion and neck supple.  Lymphadenopathy:     Cervical: No cervical adenopathy.  Skin:    General: Skin is warm and dry.     Coloration: Skin is not pale.     Findings: No erythema or rash.  Neurological:     General: No focal deficit present.     Mental Status: She is alert and oriented to person, place, and time. Mental status is at baseline.     Coordination: Coordination normal.  Psychiatric:        Mood and Affect: Mood normal.        Behavior: Behavior normal.        Thought Content: Thought content normal.         Judgment: Judgment normal.    Forward is clean and without evidence of infection or tenderness  Lab Results Lab Results  Component Value Date   WBC 10.6 (H) 04/25/2020   HGB 10.4 (L) 04/25/2020   HCT 32.8 (L) 04/25/2020   MCV 84.5 04/25/2020   PLT 217 04/25/2020    Lab Results  Component Value Date   CREATININE 0.68 04/25/2020   BUN 9 04/25/2020   NA 140 04/25/2020   K 4.1 04/25/2020   CL 105 04/25/2020   CO2 21 (L) 04/25/2020    Lab Results  Component Value Date   ALT 111 (H) 04/25/2020   AST 78 (H) 04/25/2020   ALKPHOS 62 04/25/2020   BILITOT 0.5 04/25/2020     Microbiology: Recent Results (from the past 240 hour(s))  Urine culture     Status: Abnormal   Collection Time: 04/21/20  6:44 PM   Specimen: In/Out Cath Urine  Result Value Ref Range Status   Specimen Description   Final    IN/OUT CATH URINE Performed at [facility], [address], [place], [place] [address]    Special Requests   Final    NONE Performed at [facility], [address] [place], [place] [address]    Culture MULTIPLE SPECIES PRESENT, SUGGEST RECOLLECTION (A)  Final   Report Status 04/23/2020 FINAL  Final  Blood Culture (routine x 2)     Status: None (Preliminary result)   Collection Time: 04/21/20  7:35 PM   Specimen: Right Antecubital; Blood  Result Value Ref Range Status   Specimen Description RIGHT ANTECUBITAL  Final   Special Requests   Final    BOTTLES DRAWN AEROBIC AND ANAEROBIC Blood Culture adequate volume   Culture   Final    NO GROWTH 4 DAYS Performed at [facility], [address] [place], [place] [address]    Report Status PENDING  Incomplete  Blood Culture (routine x 2)     Status: None (Preliminary result)   Collection Time: 04/21/20  8:05 PM   Specimen: Porta Cath; Blood  Result Value Ref Range Status   Specimen Description PORTA CATH DRAWN BY RN  Final   Special Requests   Final    BOTTLES DRAWN AEROBIC AND ANAEROBIC Blood Culture adequate volume   Culture    Final    NO GROWTH 4 DAYS Performed at [facility], [address], [place], [place] [address]    Report Status PENDING  Incomplete  Respiratory Panel by RT PCR (Flu A&B, Covid) - Nasopharyngeal Swab     Status: None   Collection Time: 04/21/20  9:18 PM   Specimen: Nasopharyngeal Swab  Result Value Ref Range Status   SARS Coronavirus 2 by RT PCR NEGATIVE NEGATIVE Final    Comment: (NOTE) SARS-CoV-2 target nucleic acids are NOT DETECTED. The SARS-CoV-2 RNA is generally detectable in upper respiratoy specimens during the acute phase of infection. The lowest concentration of SARS-CoV-2 viral copies this assay can detect is 131 copies/mL. A negative result does not preclude SARS-Cov-2 infection and should not be used as the sole basis for treatment or other patient management decisions. A negative result may occur with  improper specimen collection/handling, submission of specimen other than nasopharyngeal swab, presence of viral mutation(s) within the areas targeted by this assay, and inadequate number of viral copies (<131 copies/mL). A negative result must be combined with clinical observations, patient history, and epidemiological information. The expected result is Negative. Fact Sheet for Patients:  PinkCheek.be Fact Sheet for Healthcare Providers:  GravelBags.it This test is not yet ap proved or cleared by the [place] FDA and  has been authorized for detection and/or diagnosis of SARS-CoV-2 by FDA under an Emergency Use Authorization (EUA). This EUA will remain  in effect (meaning this test can be used) for the duration of the COVID-19 declaration under Section 564(b)(1) of the Act, 21 U.S.C. section 360bbb-3(b)(1), unless the authorization is terminated or revoked sooner.    Influenza A by PCR NEGATIVE NEGATIVE Final   Influenza B by PCR NEGATIVE NEGATIVE Final    Comment: (NOTE) The Xpert Xpress  SARS-CoV-2/FLU/RSV assay is intended as an aid in  the diagnosis of influenza from Nasopharyngeal swab specimens and  should not be used as a sole basis for treatment. Nasal washings and  aspirates are unacceptable for Xpert Xpress SARS-CoV-2/FLU/RSV  testing. Fact Sheet for Patients: PinkCheek.be Fact Sheet for Healthcare Providers: GravelBags.it This test is not yet approved or cleared by the [place] FDA and  has been authorized for detection and/or diagnosis of SARS-CoV-2 by  FDA under an Emergency Use Authorization (EUA). This EUA will remain  in effect (meaning this test can be used) for the duration of the  Covid-19 declaration under Section 564(b)(1) of the Act, 21  U.S.C. section 360bbb-3(b)(1), unless the authorization is  terminated or revoked. Performed at [facility], [address] [place], [place] [address]   MRSA PCR Screening     Status: None   Collection Time: 04/24/20 12:19 AM  Result Value Ref Range Status   MRSA by PCR NEGATIVE NEGATIVE Final    Comment:        The GeneXpert MRSA Assay (FDA approved for NASAL specimens only), is one component of a comprehensive MRSA colonization surveillance program. It is not intended to diagnose MRSA infection nor to guide or monitor treatment for MRSA infections. Performed at [facility], [place] [address], [place], [place] [address]   Culture, respiratory     Status: None (Preliminary result)  Collection Time: 04/25/20  2:46 PM   Specimen: Bronchial Washing, Left; Lung  Result Value Ref Range Status   Specimen Description BRONCHIAL ALVEOLAR LAVAGE LEFT  Final   Special Requests NONE  Final   Gram Stain   Final    FEW WBC PRESENT, PREDOMINANTLY PMN NO ORGANISMS SEEN    Culture   Final    NO GROWTH < 24 HOURS Performed at [facility], [place] [address] [place], [place] [address]    Report Status PENDING  Incomplete    Alcide Evener,  [place] [facility] [phone] pager  04/26/2020, 11:05 AM

## 2020-04-26 NOTE — Progress Notes (Signed)
   04/26/20 0346  Assess: MEWS Score  Temp (!) 103 F (39.4 C)  BP (!) 142/76  Pulse Rate (!) 102  Resp 20  Level of Consciousness Alert  SpO2 97 %  O2 Device Room Air  Assess: MEWS Score  MEWS Temp 2  MEWS Systolic 0  MEWS Pulse 1  MEWS RR 0  MEWS LOC 0  MEWS Score 3  MEWS Score Color Yellow  Assess: if the MEWS score is Yellow or Red  Were vital signs taken at a resting state? Yes  Focused Assessment Documented focused assessment  Early Detection of Sepsis Score *See Row Information* Medium  MEWS guidelines implemented *See Row Information* No, previously yellow, continue vital signs every 4 hours  Treat  MEWS Interventions Administered prn meds/treatments  Notify: Charge Nurse/RN  Name of Charge Nurse/RN Notified Cheryl, RN  Date Charge Nurse/RN Notified 04/26/20  Time Charge Nurse/RN Notified 0350  Notify: Provider  Provider Name/Title T. Oypd, MD  Date Provider Notified 04/26/20  Time Provider Notified 0400  Notification Type Page  Notification Reason Change in status  Response No new orders (Give PRN Advil)  Date of Provider Response 04/26/20  Time of Provider Response 0407  Document  Patient Outcome Stabilized after interventions (Temp went back down)  Progress note created (see row info) Yes

## 2020-04-26 NOTE — Plan of Care (Signed)
  Problem: Activity: Goal: Risk for activity intolerance will decrease Outcome: Progressing   Problem: Pain Managment: Goal: General experience of comfort will improve Outcome: Progressing   

## 2020-04-26 NOTE — Telephone Encounter (Signed)
Patient is currently admitted in the hospital. Today's appointments canceled and per Dr. Alen Blew no rescheduling needed. Patient will resume appointments as scheduled on 05/17/20.

## 2020-04-26 NOTE — Progress Notes (Signed)
   04/25/20 2215  Assess: MEWS Score  Temp (!) 103.1 F (39.5 C)  BP (!) 146/81  Pulse Rate (!) 111  Resp 20  Level of Consciousness Alert  SpO2 96 %  O2 Device Room Air  Assess: MEWS Score  MEWS Temp 2  MEWS Systolic 0  MEWS Pulse 2  MEWS RR 0  MEWS LOC 0  MEWS Score 4  MEWS Score Color Red  Assess: if the MEWS score is Yellow or Red  Were vital signs taken at a resting state? Yes  Focused Assessment Documented focused assessment  Early Detection of Sepsis Score *See Row Information* Medium  MEWS guidelines implemented *See Row Information* No, previously red, continue vital signs every 4 hours  Treat  MEWS Interventions Administered prn meds/treatments  Notify: Charge Nurse/RN  Name of Charge Nurse/RN Notified Cheryl, RN  Date Charge Nurse/RN Notified 04/25/20  Time Charge Nurse/RN Notified 2215  Notify: Provider  Provider Name/Title T. Opyd, MD   Date Provider Notified 04/25/20  Time Provider Notified 2255  Notification Type Page  Notification Reason Change in status  Response No new orders (Give PRN Tylenol)  Date of Provider Response 04/25/20  Time of Provider Response 2300  Document  Patient Outcome Stabilized after interventions (Temp and HR trended back down )  Progress note created (see row info) Yes

## 2020-04-27 ENCOUNTER — Inpatient Hospital Stay (HOSPITAL_COMMUNITY): Payer: BC Managed Care – PPO

## 2020-04-27 DIAGNOSIS — C8108 Nodular lymphocyte predominant Hodgkin lymphoma, lymph nodes of multiple sites: Secondary | ICD-10-CM

## 2020-04-27 DIAGNOSIS — Z9889 Other specified postprocedural states: Secondary | ICD-10-CM

## 2020-04-27 DIAGNOSIS — M545 Low back pain, unspecified: Secondary | ICD-10-CM

## 2020-04-27 LAB — CBC WITH DIFFERENTIAL/PLATELET
Abs Immature Granulocytes: 0.1 10*3/uL — ABNORMAL HIGH (ref 0.00–0.07)
Basophils Absolute: 0.1 10*3/uL (ref 0.0–0.1)
Basophils Relative: 1 %
Eosinophils Absolute: 0.1 10*3/uL (ref 0.0–0.5)
Eosinophils Relative: 1 %
HCT: 28.2 % — ABNORMAL LOW (ref 36.0–46.0)
Hemoglobin: 8.7 g/dL — ABNORMAL LOW (ref 12.0–15.0)
Immature Granulocytes: 1 %
Lymphocytes Relative: 14 %
Lymphs Abs: 1.5 10*3/uL (ref 0.7–4.0)
MCH: 26.1 pg (ref 26.0–34.0)
MCHC: 30.9 g/dL (ref 30.0–36.0)
MCV: 84.7 fL (ref 80.0–100.0)
Monocytes Absolute: 1.5 10*3/uL — ABNORMAL HIGH (ref 0.1–1.0)
Monocytes Relative: 14 %
Neutro Abs: 7.5 10*3/uL (ref 1.7–7.7)
Neutrophils Relative %: 69 %
Platelets: 173 10*3/uL (ref 150–400)
RBC: 3.33 MIL/uL — ABNORMAL LOW (ref 3.87–5.11)
RDW: 17.4 % — ABNORMAL HIGH (ref 11.5–15.5)
WBC: 10.6 10*3/uL — ABNORMAL HIGH (ref 4.0–10.5)
nRBC: 0 % (ref 0.0–0.2)

## 2020-04-27 LAB — HEPATITIS B SURFACE ANTIBODY, QUANTITATIVE: Hep B S AB Quant (Post): >1000 m[IU]/mL (ref >9.9)

## 2020-04-27 LAB — CREATININE, SERUM
Creatinine, Ser: 0.65 mg/dL (ref 0.44–1.00)
GFR calc Af Amer: >60 mL/min (ref >60)
GFR calc non Af Amer: >60 mL/min (ref >60)

## 2020-04-27 LAB — CULTURE, BLOOD (ROUTINE X 2)
Culture: NO GROWTH
Culture: NO GROWTH
Special Requests: ADEQUATE
Special Requests: ADEQUATE

## 2020-04-27 LAB — CULTURE, RESPIRATORY W GRAM STAIN: Culture: NO GROWTH

## 2020-04-27 LAB — CMV ANTIBODY, IGG (EIA): CMV Ab - IgG: 3.5 U/mL — ABNORMAL HIGH (ref 0.00–0.59)

## 2020-04-27 LAB — CYTOLOGY - NON PAP

## 2020-04-27 LAB — GLUCOSE, CAPILLARY
Glucose-Capillary: 78 mg/dL (ref 70–99)
Glucose-Capillary: 68 mg/dL — ABNORMAL LOW (ref 70–99)
Glucose-Capillary: 147 mg/dL — ABNORMAL HIGH (ref 70–99)
Glucose-Capillary: 72 mg/dL (ref 70–99)
Glucose-Capillary: 193 mg/dL — ABNORMAL HIGH (ref 70–99)

## 2020-04-27 LAB — CMV IGM: CMV IgM: <30 AU/mL (ref 0.0–29.9)

## 2020-04-27 LAB — EPSTEIN-BARR VIRUS (EBV) ANTIBODY PROFILE
EBV NA IgG: 183 U/mL — ABNORMAL HIGH (ref 0.0–17.9)
EBV VCA IgG: >600 U/mL — ABNORMAL HIGH (ref 0.0–17.9)
EBV VCA IgM: <36 U/mL (ref 0.0–35.9)

## 2020-04-27 LAB — SEDIMENTATION RATE: Sed Rate: 134 mm/hr — ABNORMAL HIGH (ref 0–22)

## 2020-04-27 LAB — HISTOPLASMA ANTIGEN, URINE: Histoplasma Antigen, urine: <0.5 (ref <0.5)

## 2020-04-27 LAB — HEPATITIS B SURFACE ANTIGEN: Hepatitis B Surface Ag: NONREACTIVE

## 2020-04-27 LAB — C-REACTIVE PROTEIN: CRP: 22.6 mg/dL — ABNORMAL HIGH (ref <1.0)

## 2020-04-27 MED ORDER — SODIUM CHLORIDE 0.9 % IV BOLUS
500.0000 mL | Freq: Once | INTRAVENOUS | Status: AC
  Administered 2020-04-27: 500 mL via INTRAVENOUS

## 2020-04-27 MED ORDER — ALTEPLASE 2 MG IJ SOLR: 2.0000 mg | Freq: Once | INTRAMUSCULAR | Status: DC

## 2020-04-27 NOTE — Progress Notes (Signed)
   04/27/20 0624  Assess: MEWS Score  Temp (!) 100.4 F (38 C)  BP (!) 161/89  Pulse Rate (!) 117  Resp 20  Level of Consciousness Alert  SpO2 94 %  O2 Device Room Air  Assess: MEWS Score  MEWS Temp 0  MEWS Systolic 0  MEWS Pulse 2  MEWS RR 0  MEWS LOC 0  MEWS Score 2  MEWS Score Color Yellow  Assess: if the MEWS score is Yellow or Red  Were vital signs taken at a resting state? Yes  Focused Assessment Documented focused assessment  Early Detection of Sepsis Score *See Row Information* Medium  MEWS guidelines implemented *See Row Information* No, previously yellow, continue vital signs every 4 hours  Treat  MEWS Interventions Administered prn meds/treatments

## 2020-04-27 NOTE — Progress Notes (Signed)
Subjective: No new complaints   Antibiotics:  Anti-infectives (From admission, onward)   Start     Dose/Rate Route Frequency Ordered Stop   04/22/20 2300  vancomycin (VANCOCIN) IVPB 1000 mg/200 mL premix  Status:  Discontinued     1,000 mg 200 mL/hr over 60 Minutes Intravenous Every 12 hours 04/22/20 1113 04/26/20 0834   04/22/20 2000  cefTRIAXone (ROCEPHIN) 1 g in sodium chloride 0.9 % 100 mL IVPB  Status:  Discontinued    Note to Pharmacy: Pharmacy to adjust timing for next dose   1 g 200 mL/hr over 30 Minutes Intravenous Every 24 hours 04/21/20 2351 04/22/20 0701   04/22/20 1000  azithromycin (ZITHROMAX) tablet 500 mg    Note to Pharmacy: Pharmacy to adjust timing for next dose   500 mg Oral Daily 04/21/20 2351 04/26/20 2059   04/22/20 0800  ceFEPIme (MAXIPIME) 2 g in sodium chloride 0.9 % 100 mL IVPB  Status:  Discontinued     2 g 200 mL/hr over 30 Minutes Intravenous Every 8 hours 04/22/20 0701 04/26/20 2355   04/22/20 0745  vancomycin (VANCOREADY) IVPB 2000 mg/400 mL     2,000 mg 200 mL/hr over 120 Minutes Intravenous  Once 04/22/20 0735 04/22/20 1218   04/21/20 2100  azithromycin (ZITHROMAX) 500 mg in sodium chloride 0.9 % 250 mL IVPB  Status:  Discontinued     500 mg 250 mL/hr over 60 Minutes Intravenous Every 24 hours 04/21/20 2051 04/23/20 0827   04/21/20 1900  cefTRIAXone (ROCEPHIN) 1 g in sodium chloride 0.9 % 100 mL IVPB     1 g 200 mL/hr over 30 Minutes Intravenous  Once 04/21/20 1851 04/21/20 2040      Medications: Scheduled Meds: . Chlorhexidine Gluconate Cloth  6 each Topical Daily  . enoxaparin (LOVENOX) injection  40 mg Subcutaneous QHS  . insulin aspart  0-5 Units Subcutaneous QHS  . insulin aspart  0-9 Units Subcutaneous TID WC  . metoprolol tartrate  25 mg Oral BID  . montelukast  10 mg Oral QHS  . rosuvastatin  10 mg Oral Daily  . sodium chloride flush  10-40 mL Intracatheter Q12H   Continuous Infusions: . sodium chloride 10 mL/hr at  04/26/20 1900   PRN Meds:.sodium chloride, acetaminophen, albuterol, ibuprofen, labetalol, morphine injection, prochlorperazine, sodium chloride flush    Objective: Weight change:   Intake/Output Summary (Last 24 hours) at 04/27/2020 1143 Last data filed at 04/27/2020 0830 Gross per 24 hour  Intake 620 ml  Output 450 ml  Net 170 ml   Blood pressure 138/90, pulse (!) 101, temperature (!) 100.9 F (38.3 C), temperature source Oral, resp. rate (!) 22, height 5\' 8"  (1.727 m), weight 107.7 kg, SpO2 92 %. Temp:  [98 F (36.7 C)-102.6 F (39.2 C)] 100.9 F (38.3 C) (05/14 1030) Pulse Rate:  [85-124] 101 (05/14 1030) Resp:  [18-38] 22 (05/14 1030) BP: (116-184)/(71-100) 138/90 (05/14 1030) SpO2:  [92 %-100 %] 92 % (05/14 1030) Weight:  [107.7 kg] 107.7 kg (05/13 2046)  Physical Exam: General: Alert and awake, oriented x3, not in any acute distress. HEENT: anicteric sclera, EOMI CVS regular rate, normal  Chest: , no wheezing, no respiratory distress Abdomen: soft non-distended,  Extremities: no edema or deformity noted bilaterally Skin: no rashes Neuro: nonfocal  CBC:    BMET Recent Labs    04/25/20 0552 04/27/20 0500  NA 140  --   K 4.1  --   CL 105  --  CO2 21*  --   GLUCOSE 103*  --   BUN 9  --   CREATININE 0.68 0.65  CALCIUM 9.0  --      Liver Panel  Recent Labs    04/25/20 0552  PROT 7.3  ALBUMIN 2.4*  AST 78*  ALT 111*  ALKPHOS 62  BILITOT 0.5       Sedimentation Rate Recent Labs    04/27/20 0911  ESRSEDRATE 134*   C-Reactive Protein Recent Labs    04/27/20 0500  CRP 22.6*    Micro Results: Recent Results (from the past 720 hour(s))  Urine culture     Status: Abnormal   Collection Time: 04/21/20  6:44 PM   Specimen: In/Out Cath Urine  Result Value Ref Range Status   Specimen Description   Final    IN/OUT CATH URINE Performed at [facility], [address], [place], [place] [address]    Special Requests   Final     NONE Performed at [facility], [address], [place], [place] [address]    Culture MULTIPLE SPECIES PRESENT, SUGGEST RECOLLECTION (A)  Final   Report Status 04/23/2020 FINAL  Final  Blood Culture (routine x 2)     Status: None   Collection Time: 04/21/20  7:35 PM   Specimen: Right Antecubital; Blood  Result Value Ref Range Status   Specimen Description RIGHT ANTECUBITAL  Final   Special Requests   Final    BOTTLES DRAWN AEROBIC AND ANAEROBIC Blood Culture adequate volume   Culture   Final    NO GROWTH 6 DAYS Performed at [facility], [address] [place], [place] [address]    Report Status 04/27/2020 FINAL  Final  Blood Culture (routine x 2)     Status: None   Collection Time: 04/21/20  8:05 PM   Specimen: Porta Cath; Blood  Result Value Ref Range Status   Specimen Description PORTA CATH DRAWN BY RN  Final   Special Requests   Final    BOTTLES DRAWN AEROBIC AND ANAEROBIC Blood Culture adequate volume   Culture   Final    NO GROWTH 6 DAYS Performed at [facility], [address] [place], [place] [address]    Report Status 04/27/2020 FINAL  Final  Respiratory Panel by RT PCR (Flu A&B, Covid) - Nasopharyngeal Swab     Status: None   Collection Time: 04/21/20  9:18 PM   Specimen: Nasopharyngeal Swab  Result Value Ref Range Status   SARS Coronavirus 2 by RT PCR NEGATIVE NEGATIVE Final    Comment: (NOTE) SARS-CoV-2 target nucleic acids are NOT DETECTED. The SARS-CoV-2 RNA is generally detectable in upper respiratoy specimens during the acute phase of infection. The lowest concentration of SARS-CoV-2 viral copies this assay can detect is 131 copies/mL. A negative result does not preclude SARS-Cov-2 infection and should not be used as the sole basis for treatment or other patient management decisions. A negative result may occur with  improper specimen collection/handling, submission of specimen other than nasopharyngeal swab, presence of viral mutation(s) within the areas  targeted by this assay, and inadequate number of viral copies (<131 copies/mL). A negative result must be combined with clinical observations, patient history, and epidemiological information. The expected result is Negative. Fact Sheet for Patients:  PinkCheek.be Fact Sheet for Healthcare Providers:  GravelBags.it This test is not yet ap proved or cleared by the [place] FDA and  has been authorized for detection and/or diagnosis of SARS-CoV-2 by FDA under an Emergency  Use Authorization (EUA). This EUA will remain  in effect (meaning this test can be used) for the duration of the COVID-19 declaration under Section 564(b)(1) of the Act, 21 U.S.C. section 360bbb-3(b)(1), unless the authorization is terminated or revoked sooner.    Influenza A by PCR NEGATIVE NEGATIVE Final   Influenza B by PCR NEGATIVE NEGATIVE Final    Comment: (NOTE) The Xpert Xpress SARS-CoV-2/FLU/RSV assay is intended as an aid in  the diagnosis of influenza from Nasopharyngeal swab specimens and  should not be used as a sole basis for treatment. Nasal washings and  aspirates are unacceptable for Xpert Xpress SARS-CoV-2/FLU/RSV  testing. Fact Sheet for Patients: PinkCheek.be Fact Sheet for Healthcare Providers: GravelBags.it This test is not yet approved or cleared by the [place] FDA and  has been authorized for detection and/or diagnosis of SARS-CoV-2 by  FDA under an Emergency Use Authorization (EUA). This EUA will remain  in effect (meaning this test can be used) for the duration of the  Covid-19 declaration under Section 564(b)(1) of the Act, 21  U.S.C. section 360bbb-3(b)(1), unless the authorization is  terminated or revoked. Performed at [facility], [address], [place], [place] [address]   MRSA PCR Screening     Status: None   Collection Time: 04/24/20 12:19 AM  Result  Value Ref Range Status   MRSA by PCR NEGATIVE NEGATIVE Final    Comment:        The GeneXpert MRSA Assay (FDA approved for NASAL specimens only), is one component of a comprehensive MRSA colonization surveillance program. It is not intended to diagnose MRSA infection nor to guide or monitor treatment for MRSA infections. Performed at [facility], [place] [address] [place], [place] [address]   Culture, respiratory     Status: None   Collection Time: 04/25/20  2:46 PM   Specimen: Bronchial Washing, Left; Lung  Result Value Ref Range Status   Specimen Description BRONCHIAL ALVEOLAR LAVAGE LEFT  Final   Special Requests NONE  Final   Gram Stain   Final    FEW WBC PRESENT, PREDOMINANTLY PMN NO ORGANISMS SEEN    Culture   Final    NO GROWTH 2 DAYS Performed at [facility], [place] [address] [place], [place] [address]    Report Status 04/27/2020 FINAL  Final  Acid Fast Smear (AFB)     Status: None   Collection Time: 04/25/20  2:46 PM   Specimen: Bronchial Washing, Left; Lung  Result Value Ref Range Status   AFB Specimen Processing Concentration  Final   Acid Fast Smear Negative  Final    Comment: (NOTE) Performed At: [facility] [place], [place] [id number] Rush Farmer MD TO:[id number]    Source (AFB) BRONCHIAL ALVEOLAR LAVAGE  Final    Comment: LEFT    Studies/Results: CT ABDOMEN PELVIS W CONTRAST  Result Date: 04/26/2020 CLINICAL DATA:  Fever. EXAM: CT ABDOMEN AND PELVIS WITH CONTRAST TECHNIQUE: Multidetector CT imaging of the abdomen and pelvis was performed using the standard protocol following bolus administration of intravenous contrast. CONTRAST:  119mL OMNIPAQUE IOHEXOL 300 MG/ML  SOLN COMPARISON:  June 14, 2015. Apr 22, 2020. FINDINGS: Lower chest: Multiple pulmonary nodules are noted in the visualized lung bases consistent with metastatic disease. Mild right basilar atelectasis is noted. Small bilateral pleural effusions are noted.  Hepatobiliary: No focal liver abnormality is seen. No gallstones, gallbladder wall thickening, or biliary dilatation. Pancreas: Unremarkable. No pancreatic ductal dilatation or surrounding inflammatory  changes. Spleen: Normal in size without focal abnormality. Adrenals/Urinary Tract: Adrenal glands are unremarkable. Kidneys are normal, without renal calculi, focal lesion, or hydronephrosis. Bladder is unremarkable. Stomach/Bowel: Stomach is within normal limits. Appendix appears normal. No evidence of bowel wall thickening, distention, or inflammatory changes. Vascular/Lymphatic: No significant vascular findings are present. No enlarged abdominal or pelvic lymph nodes. Reproductive: Status post hysterectomy. No adnexal masses. Other: No abdominal wall hernia or abnormality. No abdominopelvic ascites. Musculoskeletal: No acute or significant osseous findings. IMPRESSION: 1. Multiple pulmonary nodules are noted in the visualized lung bases consistent with metastatic disease. 2. Small bilateral pleural effusions are noted. 3. No other abnormality seen in the abdomen or pelvis. Electronically Signed   By: Marijo Conception M.D.   On: 04/26/2020 20:45   DG CHEST PORT 1 VIEW  Result Date: 04/25/2020 CLINICAL DATA:  Post bronchoscopy EXAM: PORTABLE CHEST 1 VIEW COMPARISON:  CT 04/22/2020, radiograph 04/21/2020 FINDINGS: Redemonstration of the innumerable pulmonary nodules throughout both lungs better seen on comparison CT from 04/22/2020. There is increasing hazy opacity towards the lung bases and periphery with central vascular cuffing and congestion as well as septal and fissural thickening. No pneumothorax. Small right pleural effusion. No visible left effusion or pneumothorax. Cardiomediastinal contours are stable. Accessed left IJ approach Port-A-Cath tip terminates at the superior cavoatrial junction. Telemetry leads overlie the chest. No acute osseous or soft tissue abnormality. Degenerative changes are present in  the imaged spine and shoulders. IMPRESSION: 1. Innumerable pulmonary nodules compatible with metastatic disease throughout both lungs better seen on comparison CT from 04/22/2020. 2. Worsening hazy opacity towards the lung bases and periphery with central vascular cuffing and septal and fissural thickening could suggest worsening edema and some postprocedural atelectasis, now with small right effusion. Electronically Signed   By: Lovena Le M.D.   On: 04/25/2020 18:46   DG C-ARM BRONCHOSCOPY  Result Date: 04/25/2020 C-ARM BRONCHOSCOPY: Fluoroscopy was utilized by the requesting physician.  No radiographic interpretation.      Assessment/Plan:  INTERVAL HISTORY: Cultures and serologies are unrevealing so far.   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 female with T-cell lymphoma previously in remission with recurrence in 2020 in December on salvage chemotherapy admitted with low back pain and found to have high fevers to 103 degrees.  CT scan has shown nodules throughout the lungs which could be recurrence of her lymphoma versus an atypical infection.  1.  Lung nodules with fevers: She told me that her initial lymphoma presentation included a fever with lymphadenopathy.  I think if the bronchoscopy does not yield a firm diagnosis she will need to have a more invasive procedure such as IR guided biopsy versus VATS.  Dr. Linus Salmons will follow up on her culture data over the weekend and Dr. Baxter Flattery will take over the service on Monday   LOS: 6 days   Alcide Evener 04/27/2020, 11:43 AM

## 2020-04-27 NOTE — Progress Notes (Addendum)
NAME:  Megan Mcknight, MRN:  [id number], DOB:  1961/04/03, LOS: 6 ADMISSION DATE:  04/21/2020, CONSULTATION DATE:  04/23/2020 REFERRING MD:  Dr. 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  Found to have new b/l innumerable nodules of lung on CT chest from 04/22/20.  History of present illness   59 yo female was diagnosed with lymphoma in June of 2014.  She had autologous stem cell transplant (SCT) in February 2015.  Completed 6 cycles of CHOEP.  She was found to have relapse in 11/ 2020 by left supraclavicular lymph node biopsy.  She was started on adcetris in December 2020.  Only apparent side effect was neuropathy.  She had PET scan on 02/28/20  that showed near complete response in adenopathy and no lung nodules.   She was working in her yard about 2 weeks prior to admission.  She developed back pain and presented to the ER.  She also developed a cough.  In ER she was found to have fever 102.5F and CXR showed b/l ASD.  She was started on broad spectrum antibiotics.  She continued to have fevers.  She had CT chest on 04/22/20 that showed new b/l innumerable lung nodules.    About one week prior to admission she noticed feeling more fatigued and having sweats.  She wasn't aware of having.  She though her cough was related to allergies.  She had TB testing in 2014 prior to starting therapy for lymphoma and this was negative.  She is from [place].  No recent travel or sick exposures.  Denies any animal/bird exposures.  Past Medical History  HTN, DM, [place] Events   5/08 Admit  Consults:    Procedures:  04/25/2020 Bronchoscopy with biopsy  Significant Diagnostic Tests:  CT chest 04/22/20 >> new diffuse b/l innumerable pulmonary nodules ranging in size from 2 to  10 mm  Micro Data:  SARS CoV2 PCR 5/08 >> negative Blood 5/08 >>ng HIV 5/09 >> nonreactive Legionella 5/09 >>  Histoplasma Ag 5/10 >> Aspergillus Ab 5/10 >>  Quantiferon gold 5/10 >> neg Fungitell >>> neg   Antimicrobials:  Rocephin 5/08 Zithromax 5/08 >> Vancomycin 5/09 >> Cefepime 5/09 >>  Interim history/subjective:  Febrile T max 101.7, currently 100.9 WBC 10.6 + cough + 9.6 L On RA, complaining of nausea, sat 92% RR 22 CXR 5/14>> Personally reviewed  There is diffuse nodular interstitial disease throughout the lungs. There is ill-defined airspace opacity in the right base with small right pleural effusion. Heart size and pulmonary vascular normal  Objective   Blood pressure 138/90, pulse (!) 101, temperature (!) 100.9 F (38.3 C), temperature source Oral, resp. rate (!) 22, height 5\' 8"  (1.727 m), weight 107.7 kg, SpO2 92 %.        Intake/Output Summary (Last 24 hours) at 04/27/2020 1120 Last data filed at 04/27/2020 0830 Gross per 24 hour  Intake 620 ml  Output 450 ml  Net 170 ml   Filed Weights   04/22/20 1700 04/23/20 2102 04/26/20 2046  Weight: 100.7 kg 102.8 kg 107.7 kg    Examination:  Gen. Pleasant, obese, complaining of nausea, normal affect, sitting on side of bed ENT - no pallor,icterus, no post nasal drip,  Neck: No JVD, no thyromegaly, no carotid  bruits Lungs: Bilateral chest excursion, no use of accessory muscles, decreased without rales or rhonchi , few crackles noted per bases Cardiovascular:  S1S2, RRR,   normal, no murmurs or gallops, no peripheral edema Abdomen: soft and non-tender, ND,  no hepatosplenomegaly, BS normal. Musculoskeletal: No obvious deformities, no cyanosis or clubbing Neuro:  alert, non focal, no tremors, appropriate     Discussion:  59 yo female with new fever, cough found to have innumerable pulmonary nodules on CT chest.  She has hx of T cell lymphoma, relapse in 11/2019 on  adcetris.  Her PET scan from March  2021 did not show these nodules.  Assessment & Plan:   FUO Multiple Lung nodules. Cough Ill defined airspace opacity in the right base  Plan - continue current ABx regimen - Trend fever and WBC - Culture as is clinically approriate -Await transbronchial biopsy results and microbiology data from bronchoscopy , if nondiagnostic would proceed with CT-guided biopsy of lung nodule>> pending -Appreciate ID input  + 9.6 L per I&O Few Crackles noted per bases Sat 92% on RA Small right pleural effusion  Plan CXR today to eval  For vascular congestion Lasix if needed based on CXR Titrate  oxygen for sats > 92% Consider gentle  diuresis   Magdalen Spatz, AGACNP- BC. [facility] Please refer to Amion for pager number and assignments  Page [phone] after 4 PM  04/27/2020 12:03 PM

## 2020-04-27 NOTE — Progress Notes (Signed)
   04/27/20 0246  Assess: MEWS Score  Temp 98 F (36.7 C)  BP 119/77  Pulse Rate 85  Resp (!) 24  Level of Consciousness Alert  SpO2 100 %  O2 Device Nasal Cannula  O2 Flow Rate (L/min) 3 L/min  Assess: MEWS Score  MEWS Temp 0  MEWS Systolic 0  MEWS Pulse 0  MEWS RR 1  MEWS LOC 0  MEWS Score 1  MEWS Score Color Green  Assess: if the MEWS score is Yellow or Red  Were vital signs taken at a resting state? Yes  Focused Assessment Documented focused assessment  Early Detection of Sepsis Score *See Row Information* Medium  MEWS guidelines implemented *See Row Information* No, previously yellow, continue vital signs every 4 hours  Treat  MEWS Interventions Other (Comment) (Continue to monitor)  Document  Patient Outcome Stabilized after interventions  Progress note created (see row info) Yes

## 2020-04-27 NOTE — Progress Notes (Signed)
Inpatient Diabetes Program Recommendations  AACE/ADA: New Consensus Statement on Inpatient Glycemic Control (2015)  Target Ranges:  Prepandial:   less than 140 mg/dL      Peak postprandial:   less than 180 mg/dL (1-2 hours)      Critically ill patients:  140 - 180 mg/dL   Lab Results  Component Value Date   GLUCAP 147 (H) 04/27/2020   HGBA1C 7.4 (H) 04/22/2020    Review of Glycemic Control  Results for SHAUNTELL, IGLESIA (MRN [id number]) as of 04/27/2020 14:11  Ref. Range 04/26/2020 16:12 04/26/2020 20:50 04/27/2020 06:44 04/27/2020 12:14 04/27/2020 13:02  Glucose-Capillary Latest Ref Range: 70 - 99 mg/dL 73 84 78 68 (L) 147 (H)     Inpatient Diabetes Program Recommendations:    Novolog 0-6 TID (very sensitive scale)  Thank you, Reche Dixon, RN, BSN Diabetes Coordinator Inpatient Diabetes Program [phone] (team pager from 8a-5p)

## 2020-04-27 NOTE — Anesthesia Preprocedure Evaluation (Signed)
Anesthesia Evaluation  Patient identified by MRN, date of birth, ID band Patient awake    Reviewed: Allergy & Precautions, H&P , NPO status , Patient's Chart, lab work & pertinent test results  History of Anesthesia Complications Negative for: history of anesthetic complications  Airway Mallampati: I  TM Distance: >3 FB Neck ROM: Full    Dental  (+) Teeth Intact, Dental Advisory Given, Missing   Pulmonary shortness of breath, asthma , pneumonia, resolved, Not current smoker, former smoker,  Lung lesions    (-) wheezing  rales    Cardiovascular hypertension, + dysrhythmias (5/14 ECHO: EF 75%, normal LVF and valves) Atrial Fibrillation  Rhythm:Regular Rate:Normal     Neuro/Psych    GI/Hepatic GERD  Medicated and Controlled,Liver nodules   Endo/Other  diabetesMorbid obesity  Renal/GU Renal InsufficiencyRenal disease (resolved renal insuff)     Musculoskeletal   Abdominal (+) + obese,   Peds  Hematology  (+) Blood dyscrasia, anemia ,   Anesthesia Other Findings   Reproductive/Obstetrics                             Anesthesia Physical Anesthesia Plan  ASA: III  Anesthesia Plan: General   Post-op Pain Management:    Induction: Intravenous  PONV Risk Score and Plan: 3 and Ondansetron and Dexamethasone  Airway Management Planned: Oral ETT  Additional Equipment: None  Intra-op Plan:   Post-operative Plan: Extubation in OR  Informed Consent: I have reviewed the patients History and Physical, chart, labs and discussed the procedure including the risks, benefits and alternatives for the proposed anesthesia with the patient or authorized representative who has indicated his/her understanding and acceptance.     Dental advisory given  Plan Discussed with: CRNA and Surgeon  Anesthesia Plan Comments:         Anesthesia Quick Evaluation

## 2020-04-27 NOTE — Anesthesia Postprocedure Evaluation (Signed)
Anesthesia Post Note  Patient: Megan Mcknight  Procedure(s) Performed: VIDEO BRONCHOSCOPY WITH FLUORO (N/A ) BRONCHIAL WASHINGS LUNG BIOPSY BRONCHIAL BRUSHINGS     Patient location during evaluation: PACU Anesthesia Type: General Level of consciousness: awake and patient cooperative Pain management: pain level controlled Vital Signs Assessment: post-procedure vital signs reviewed and stable Respiratory status: spontaneous breathing, nonlabored ventilation, respiratory function stable and patient connected to nasal cannula oxygen Cardiovascular status: blood pressure returned to baseline and stable Postop Assessment: no apparent nausea or vomiting Anesthetic complications: no    Last Vitals:  Vitals:   04/27/20 0625 04/27/20 0830  BP: (!) 161/85 136/85  Pulse: (!) 115 (!) 115  Resp: 20 (!) 24  Temp: 37.2 C 37.8 C  SpO2: 98% 94%    Last Pain:  Vitals:   04/27/20 0830  TempSrc: Oral  PainSc:                  Kane Kusek

## 2020-04-27 NOTE — Progress Notes (Signed)
Events noted.  Status post bronchoscopy and biopsy continues to have low grade fever.  CT scan of the abdomen and pelvis did not show any evidence of lymphadenopathy.  The presentation is still puzzling at this time with the differential diagnosis includes cell lymphoma recurrence versus atypical infection.  She is currently on active treatment for her lymphoma and had an excellent response with a PET scan that showed near resolution of her cancer in March 2021.  I recommend continued supportive management at this time we will await the results of her bronchoscopy for further recommendation.

## 2020-04-27 NOTE — Progress Notes (Signed)
PROGRESS NOTE    Megan Mcknight  GGY:[id number] DOB: 06/03/1961 DOA: 04/21/2020 PCP: Rosita Fire, MD    Brief Narrative:  59 year old female with history of anaplastic T-cell lymphoma diagnosed in 2014, status post stem cell transplant in 2015, relapse on 11/2019 and currently on Adcetris followed by Dr. Alen Blew, type 2 diabetes on insulin, hyperlipidemia presented to the emergency room with about 2-week history of right lower back pain.  She was found to have temperature of 102.9.  Sepsis work-up initiated.  CT chest showed new innumerable pulmonary nodules.  For bronchoscopy and further diagnostic, she was transferred to [facility]. 5/12: Bronchoscopy and biopsy, results pending 5/14: All infection work-up negative, still persistent fever.   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  Treated with antibiotics in the setting of immunocompromised state. Patient was on vancomycin, cefepime and azithromycin.  Followed by infectious disease.  Monitoring off antibiotics.  All cultures negative so far.  Bronc biopsy and lavage, cultures pending.  Type 2 diabetes with hyperglycemia.  Blood sugars still low.  All her long-acting insulin has been discontinued.  Remains on sensitive sliding scale.   Lower back pain: Negative CT scan.  Improving.  Urine culture with multiple growth.  Anaplastic T-cell lymphoma on chemotherapy: Last chemo 4/22.  Followed by her oncologist.  Hypertension:  Stable.  On metoprolol and Norvasc.   DVT prophylaxis: Lovenox subcu Code Status: Full code Family Communication: None Disposition Plan: Status is: Inpatient  Remains inpatient appropriate because:Inpatient level of care appropriate due to severity of illness   Dispo: The patient is from: Home              Anticipated d/c is to: Home              Anticipated d/c date is: 2 days              Patient currently is not medically stable to d/c.          Consultants:   PCCM  Oncology  Infectious disease  Procedures:   Bronchoscopy and biopsy  Antimicrobials:  Antibiotics Given (last 72 hours)    Date/Time Action Medication Dose Rate   04/24/20 1231 New Bag/Given   vancomycin (VANCOCIN) IVPB 1000 mg/200 mL premix 1,000 mg 200 mL/hr   04/24/20 1608 New Bag/Given   ceFEPIme (MAXIPIME) 2 g in sodium chloride 0.9 % 100 mL IVPB 2 g 200 mL/hr   04/24/20 2102 Given   azithromycin (ZITHROMAX) tablet 500 mg 500 mg    04/24/20 2304 New Bag/Given   vancomycin (VANCOCIN) IVPB 1000 mg/200 mL premix 1,000 mg 200 mL/hr   04/25/20 0012 New Bag/Given   ceFEPIme (MAXIPIME) 2 g in sodium chloride 0.9 % 100 mL IVPB 2 g 200 mL/hr   04/25/20 0350 New Bag/Given   ceFEPIme (MAXIPIME) 2 g in sodium chloride 0.9 % 100 mL IVPB 2 g 200 mL/hr   04/25/20 1046 New Bag/Given   vancomycin (VANCOCIN) IVPB 1000 mg/200  mL premix 1,000 mg 200 mL/hr   04/25/20 2234 Given   azithromycin (ZITHROMAX) tablet 500 mg 500 mg    04/25/20 2242 New Bag/Given   vancomycin (VANCOCIN) IVPB 1000 mg/200 mL premix 1,000 mg 200 mL/hr   04/25/20 2352 New Bag/Given   ceFEPIme (MAXIPIME) 2 g in sodium chloride 0.9 % 100 mL IVPB 2 g 200 mL/hr   04/26/20 0801 New Bag/Given   ceFEPIme (MAXIPIME) 2 g in sodium chloride 0.9 % 100 mL IVPB 2 g 200 mL/hr   04/26/20 1646 New Bag/Given   ceFEPIme (MAXIPIME) 2 g in sodium chloride 0.9 % 100 mL IVPB 2 g 200 mL/hr   04/26/20 2059 Given   azithromycin (ZITHROMAX) tablet 500 mg 500  mg          Subjective: Seen and examined.  Feels tired.  Continues to have temperature. Objective: Vitals:   04/27/20 0624 04/27/20 0625 04/27/20 0830 04/27/20 1030  BP: (!) 161/89 (!) 161/85 136/85 138/90  Pulse: (!) 117 (!) 115 (!) 115 (!) 101  Resp: 20 20 (!) 24 (!) 22  Temp: (!) 100.4 F (38 C) 99 F (37.2 C) 100.1 F (37.8 C) (!) 100.9 F (38.3 C)  TempSrc: Oral Oral Oral Oral  SpO2: 94% 98% 94% 92%  Weight:      Height:        Intake/Output Summary (Last 24 hours) at 04/27/2020 1115 Last data filed at 04/27/2020 0830 Gross per 24 hour  Intake 620 ml  Output 450 ml  Net 170 ml   Filed Weights   04/22/20 1700 04/23/20 2102 04/26/20 2046  Weight: 100.7 kg 102.8 kg 107.7 kg    Examination:  General exam: Appears calm and comfortable, on room air.  Chronically sick looking. Respiratory system: Clear to auscultation. Respiratory effort normal.  No added sounds. A Port-A-Cath on the left chest. Cardiovascular system: S1 & S2 heard, RRR. Gastrointestinal system: Abdomen is nondistended, soft and nontender. No organomegaly or masses felt. Normal bowel sounds heard. Central nervous system: Alert and oriented. No focal neurological deficits. Extremities: Symmetric 5 x 5 power. Skin: No rashes, lesions or ulcers Psychiatry: Judgement and insight appear normal. Mood & affect appropriate.     Data Reviewed: I have personally reviewed following labs and imaging studies  CBC: Recent Labs  Lab 04/21/20 1935 04/21/20 1935 04/22/20 0508 04/23/20 8502 04/24/20 0928 04/25/20 0552 04/27/20 0500  WBC 9.5   < > 8.5 7.4 6.7 10.6* 10.6*  NEUTROABS 6.4  --   --   --   --   --  7.5  HGB 10.2*   < > 9.6* 9.3* 9.8* 10.4* 8.7*  HCT 32.7*   < > 30.5* 29.5* 31.9* 32.8* 28.2*  MCV 85.6   < > 85.4 85.0 85.3 84.5 84.7  PLT 238   < > 228 200 176 217 173   < > = values in this interval not displayed.   Basic Metabolic Panel: Recent Labs  Lab 04/21/20 1935 04/21/20 1935  04/22/20 0508 04/23/20 0621 04/24/20 0928 04/25/20 0552 04/27/20 0500  NA 137  --  139 137 141 140  --   K 2.9*  --  3.3* 3.6 4.1 4.1  --   CL 100  --  104 105 109 105  --   CO2 27  --  25 22 23  21*  --   GLUCOSE 105*  --  117* 119* 121* 103*  --   BUN 10  --  10 10 11 9   --  CREATININE 0.87   < > 0.67 0.70 0.73 0.68 0.65  CALCIUM 8.8*  --  8.6* 8.4* 8.7* 9.0  --   MG  --   --   --  1.6* 1.8  --   --    < > = values in this interval not displayed.   GFR: Estimated Creatinine Clearance: 98.5 mL/min (by C-G formula based on SCr of 0.65 mg/dL). Liver Function Tests: Recent Labs  Lab 04/21/20 1935 04/23/20 0621 04/24/20 0928 04/25/20 0552  AST 36 81* 74* 78*  ALT 31 79* 91* 111*  ALKPHOS 57 50 46 62  BILITOT 0.8 0.7 0.5 0.5  PROT 7.5 6.6 6.3* 7.3  ALBUMIN 2.9* 2.4* 2.1* 2.4*   No results for input(s): LIPASE, AMYLASE in the last 168 hours. No results for input(s): AMMONIA in the last 168 hours. Coagulation Profile: Recent Labs  Lab 04/21/20 1935  INR 1.3*   Cardiac Enzymes: No results for input(s): CKTOTAL, CKMB, CKMBINDEX, TROPONINI in the last 168 hours. BNP (last 3 results) No results for input(s): PROBNP in the last 8760 hours. HbA1C: No results for input(s): HGBA1C in the last 72 hours. CBG: Recent Labs  Lab 04/26/20 0646 04/26/20 1129 04/26/20 1612 04/26/20 2050 04/27/20 0644  GLUCAP 103* 91 73 84 78   Lipid Profile: No results for input(s): CHOL, HDL, LDLCALC, TRIG, CHOLHDL, LDLDIRECT in the last 72 hours. Thyroid Function Tests: No results for input(s): TSH, T4TOTAL, FREET4, T3FREE, THYROIDAB in the last 72 hours. Anemia Panel: No results for input(s): VITAMINB12, FOLATE, FERRITIN, TIBC, IRON, RETICCTPCT in the last 72 hours. Sepsis Labs: Recent Labs  Lab 04/21/20 1935 04/21/20 2135 04/22/20 0508 04/23/20 0621  PROCALCITON  --   --  0.69 0.38  LATICACIDVEN 0.8 0.8  --   --     Recent Results (from the past 240 hour(s))  Urine culture      Status: Abnormal   Collection Time: 04/21/20  6:44 PM   Specimen: In/Out Cath Urine  Result Value Ref Range Status   Specimen Description   Final    IN/OUT CATH URINE Performed at [facility], [address] [place], [place] [address]    Special Requests   Final    NONE Performed at [facility], [address] [place], [place] [address]    Culture MULTIPLE SPECIES PRESENT, SUGGEST RECOLLECTION (A)  Final   Report Status 04/23/2020 FINAL  Final  Blood Culture (routine x 2)     Status: None   Collection Time: 04/21/20  7:35 PM   Specimen: Right Antecubital; Blood  Result Value Ref Range Status   Specimen Description RIGHT ANTECUBITAL  Final   Special Requests   Final    BOTTLES DRAWN AEROBIC AND ANAEROBIC Blood Culture adequate volume   Culture   Final    NO GROWTH 6 DAYS Performed at [facility], [address], [place], [place] [address]    Report Status 04/27/2020 FINAL  Final  Blood Culture (routine x 2)     Status: None   Collection Time: 04/21/20  8:05 PM   Specimen: Porta Cath; Blood  Result Value Ref Range Status   Specimen Description PORTA CATH DRAWN BY RN  Final   Special Requests   Final    BOTTLES DRAWN AEROBIC AND ANAEROBIC Blood Culture adequate volume   Culture   Final    NO GROWTH 6 DAYS Performed at [facility], [address] [place], [place] [address]    Report Status 04/27/2020 FINAL  Final  Respiratory Panel by RT PCR (Flu A&B, Covid) - Nasopharyngeal Swab     Status: None   Collection Time: 04/21/20  9:18 PM   Specimen: Nasopharyngeal Swab  Result Value Ref Range Status   SARS Coronavirus 2 by RT PCR NEGATIVE NEGATIVE Final    Comment: (NOTE) SARS-CoV-2 target nucleic acids are NOT DETECTED. The SARS-CoV-2 RNA is generally detectable in upper respiratoy specimens during the acute phase of infection. The lowest concentration of SARS-CoV-2 viral copies this assay can detect is 131 copies/mL. A negative result does not preclude  SARS-Cov-2 infection and should not be used as the sole basis for treatment or other patient management decisions. A negative result may occur with  improper specimen collection/handling, submission of specimen other than nasopharyngeal swab, presence of viral mutation(s) within the areas targeted by this assay, and inadequate number of viral copies (<131 copies/mL). A negative result must be combined with clinical observations, patient history, and epidemiological information. The expected result is Negative. Fact Sheet for Patients:  PinkCheek.be Fact Sheet for Healthcare Providers:  GravelBags.it This test is not yet ap proved or cleared by the [place] FDA and  has been authorized for detection and/or diagnosis of SARS-CoV-2 by FDA under an Emergency Use Authorization (EUA). This EUA will remain  in effect (meaning this test can be used) for the duration of the COVID-19 declaration under Section 564(b)(1) of the Act, 21 U.S.C. section 360bbb-3(b)(1), unless the authorization is terminated or revoked sooner.    Influenza A by PCR NEGATIVE NEGATIVE Final   Influenza B by PCR NEGATIVE NEGATIVE Final    Comment: (NOTE) The Xpert Xpress SARS-CoV-2/FLU/RSV assay is intended as an aid in  the diagnosis of influenza from Nasopharyngeal swab specimens and  should not be used as a sole basis for treatment. Nasal washings and  aspirates are unacceptable for Xpert Xpress SARS-CoV-2/FLU/RSV  testing. Fact Sheet for Patients: PinkCheek.be Fact Sheet for Healthcare Providers: GravelBags.it This test is not yet approved or cleared by the [place] FDA and  has been authorized for detection and/or diagnosis of SARS-CoV-2 by  FDA under an Emergency Use Authorization (EUA). This EUA will remain  in effect (meaning this test can be used) for the duration of the  Covid-19  declaration under Section 564(b)(1) of the Act, 21  U.S.C. section 360bbb-3(b)(1), unless the authorization is  terminated or revoked. Performed at [facility], [address] [place], [place] [address]   MRSA PCR Screening     Status: None   Collection Time: 04/24/20 12:19 AM  Result Value Ref Range Status   MRSA by PCR NEGATIVE NEGATIVE Final    Comment:        The GeneXpert MRSA Assay (FDA approved for NASAL specimens only), is one component of a comprehensive MRSA colonization surveillance program. It is not intended to diagnose MRSA infection nor to guide or monitor treatment for MRSA infections. Performed at [facility], [place] [address] [place],  [address]   Culture, respiratory     Status: None   Collection Time: 04/25/20  2:46 PM   Specimen: Bronchial Washing, Left; Lung  Result Value Ref Range Status   Specimen Description BRONCHIAL ALVEOLAR LAVAGE LEFT  Final   Special Requests NONE  Final   Gram Stain   Final    FEW WBC PRESENT, PREDOMINANTLY PMN NO ORGANISMS SEEN    Culture   Final    NO GROWTH 2 DAYS Performed at [facility], [address] [place], [place] [address]    Report Status 04/27/2020 FINAL  Final  Acid Fast Smear (AFB)     Status: None   Collection Time: 04/25/20  2:46 PM   Specimen: Bronchial Washing, Left; Lung  Result Value Ref Range Status   AFB Specimen Processing Concentration  Final   Acid Fast Smear Negative  Final    Comment: (NOTE) Performed At: [facility] [place], [place] [id number] Rush Farmer MD KZ:[id number]    Source (AFB) BRONCHIAL ALVEOLAR LAVAGE  Final    Comment: LEFT         Radiology Studies: CT ABDOMEN PELVIS W CONTRAST  Result Date: 04/26/2020 CLINICAL DATA:  Fever. EXAM: CT ABDOMEN AND PELVIS WITH CONTRAST TECHNIQUE: Multidetector CT imaging of the abdomen and pelvis was performed using the standard protocol following bolus administration of intravenous  contrast. CONTRAST:  190mL OMNIPAQUE IOHEXOL 300 MG/ML  SOLN COMPARISON:  June 14, 2015. Apr 22, 2020. FINDINGS: Lower chest: Multiple pulmonary nodules are noted in the visualized lung bases consistent with metastatic disease. Mild right basilar atelectasis is noted. Small bilateral pleural effusions are noted. Hepatobiliary: No focal liver abnormality is seen. No gallstones, gallbladder wall thickening, or biliary dilatation. Pancreas: Unremarkable. No pancreatic ductal dilatation or surrounding inflammatory changes. Spleen: Normal in size without focal abnormality. Adrenals/Urinary Tract: Adrenal glands are unremarkable. Kidneys are normal, without renal calculi, focal lesion, or hydronephrosis. Bladder is unremarkable. Stomach/Bowel: Stomach is within normal limits. Appendix appears normal. No evidence of bowel wall thickening, distention, or inflammatory changes. Vascular/Lymphatic: No significant vascular findings are present. No enlarged abdominal or pelvic lymph nodes. Reproductive: Status post hysterectomy. No adnexal masses. Other: No abdominal wall hernia or abnormality. No abdominopelvic ascites. Musculoskeletal: No acute or significant osseous findings. IMPRESSION: 1. Multiple pulmonary nodules are noted in the visualized lung bases consistent with metastatic disease. 2. Small bilateral pleural effusions are noted. 3. No other abnormality seen in the abdomen or pelvis. Electronically Signed   By: Marijo Conception M.D.   On: 04/26/2020 20:45   DG CHEST PORT 1 VIEW  Result Date: 04/25/2020 CLINICAL DATA:  Post bronchoscopy EXAM: PORTABLE CHEST 1 VIEW COMPARISON:  CT 04/22/2020, radiograph 04/21/2020 FINDINGS: Redemonstration of the innumerable pulmonary nodules throughout both lungs better seen on comparison CT from 04/22/2020. There is increasing hazy opacity towards the lung bases and periphery with central vascular cuffing and congestion as well as septal and fissural thickening. No pneumothorax.  Small right pleural effusion. No visible left effusion or pneumothorax. Cardiomediastinal contours are stable. Accessed left IJ approach Port-A-Cath tip terminates at the superior cavoatrial junction. Telemetry leads overlie the chest. No acute osseous or soft tissue abnormality. Degenerative changes are present in the imaged spine and shoulders. IMPRESSION: 1. Innumerable pulmonary nodules compatible with metastatic disease throughout both lungs better seen on comparison CT from 04/22/2020. 2. Worsening hazy opacity towards the lung bases and periphery with central vascular cuffing and septal and fissural thickening could suggest worsening edema and some postprocedural atelectasis, now with small right effusion. Electronically Signed   By: Lovena Le M.D.   On: 04/25/2020 18:46   DG C-ARM BRONCHOSCOPY  Result Date: 04/25/2020 C-ARM BRONCHOSCOPY: Fluoroscopy was utilized by the requesting physician.  No radiographic interpretation.        Scheduled Meds:  Chlorhexidine Gluconate Cloth  6 each Topical Daily   enoxaparin (LOVENOX) injection  40 mg Subcutaneous QHS   insulin aspart  0-5 Units Subcutaneous QHS   insulin aspart  0-9 Units Subcutaneous TID WC   metoprolol tartrate  25 mg Oral BID   montelukast  10 mg Oral QHS   rosuvastatin  10 mg Oral Daily   sodium chloride flush  10-40 mL Intracatheter Q12H   Continuous Infusions:  sodium chloride 10 mL/hr at 04/26/20 1900     LOS: 6 days    Time spent: 25 minutes    Barb Merino, MD [facility] Pager [phone]

## 2020-04-27 NOTE — Progress Notes (Signed)
  CBG: 68. Hypoglycemia protocol initiated. Recheck CBG was 147.

## 2020-04-28 LAB — CBC WITH DIFFERENTIAL/PLATELET
Abs Immature Granulocytes: 0.1 10*3/uL — ABNORMAL HIGH (ref 0.00–0.07)
Basophils Absolute: 0 10*3/uL (ref 0.0–0.1)
Basophils Relative: 0 %
Eosinophils Absolute: 0 10*3/uL (ref 0.0–0.5)
Eosinophils Relative: 0 %
HCT: 26.1 % — ABNORMAL LOW (ref 36.0–46.0)
Hemoglobin: 8.4 g/dL — ABNORMAL LOW (ref 12.0–15.0)
Immature Granulocytes: 1 %
Lymphocytes Relative: 18 %
Lymphs Abs: 2 10*3/uL (ref 0.7–4.0)
MCH: 26.5 pg (ref 26.0–34.0)
MCHC: 32.2 g/dL (ref 30.0–36.0)
MCV: 82.3 fL (ref 80.0–100.0)
Monocytes Absolute: 1.7 10*3/uL — ABNORMAL HIGH (ref 0.1–1.0)
Monocytes Relative: 16 %
Neutro Abs: 6.9 10*3/uL (ref 1.7–7.7)
Neutrophils Relative %: 65 %
Platelets: 189 10*3/uL (ref 150–400)
RBC: 3.17 MIL/uL — ABNORMAL LOW (ref 3.87–5.11)
RDW: 17.1 % — ABNORMAL HIGH (ref 11.5–15.5)
WBC: 10.6 10*3/uL — ABNORMAL HIGH (ref 4.0–10.5)
nRBC: 0 % (ref 0.0–0.2)

## 2020-04-28 LAB — ASPERGILLUS ANTIBODY BY IMMUNODIFF
Aspergillus flavus: NEGATIVE
Aspergillus fumigatus, IgG: NEGATIVE
Aspergillus niger: NEGATIVE

## 2020-04-28 LAB — PHOSPHORUS: Phosphorus: 3.7 mg/dL (ref 2.5–4.6)

## 2020-04-28 LAB — GLUCOSE, CAPILLARY
Glucose-Capillary: 99 mg/dL (ref 70–99)
Glucose-Capillary: 95 mg/dL (ref 70–99)
Glucose-Capillary: 69 mg/dL — ABNORMAL LOW (ref 70–99)
Glucose-Capillary: 109 mg/dL — ABNORMAL HIGH (ref 70–99)
Glucose-Capillary: 84 mg/dL (ref 70–99)

## 2020-04-28 LAB — CREATININE, SERUM
Creatinine, Ser: 0.66 mg/dL (ref 0.44–1.00)
GFR calc Af Amer: >60 mL/min (ref >60)
GFR calc non Af Amer: >60 mL/min (ref >60)

## 2020-04-28 LAB — MAGNESIUM: Magnesium: 1.6 mg/dL — ABNORMAL LOW (ref 1.7–2.4)

## 2020-04-28 NOTE — Plan of Care (Signed)
  Problem: Health Behavior/Discharge Planning: Goal: Ability to manage health-related needs will improve Outcome: Progressing   

## 2020-04-28 NOTE — Progress Notes (Signed)
PROGRESS NOTE    Megan Mcknight  AYT:[id number] DOB: [birth year] DOA: 04/21/2020 PCP: Rosita Fire, MD    Brief Narrative:  59 year old female with history of anaplastic T-cell lymphoma diagnosed in 2014, status post stem cell transplant in 2015, relapse on 11/2019 and currently on Adcetris followed by Dr. Alen Blew, type 2 diabetes on insulin, hyperlipidemia presented to the emergency room with about 2-week history of right lower back pain.  She was found to have temperature of 102.9.  Sepsis work-up initiated.  CT chest showed new innumerable pulmonary nodules.  For bronchoscopy and further diagnostic, she was transferred to [facility]. 5/12: Bronchoscopy and biopsy, results pending 5/14: All infection work-up negative, still persistent fever.   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  Treated with antibiotics in the setting of immunocompromised state. Patient was on vancomycin, cefepime and azithromycin.  Discontinued. Bronc biopsy and lavage, cultures pending. Bronc biopsy pending.  Cytology with atypical cells. Hepatitis negative.  Cryptococcal antigen negative.  Type 2 diabetes with hyperglycemia.  Blood sugars still low.  All her long-acting insulin has been discontinued.  Remains on sensitive sliding scale.  Better today.  Lower back pain: Negative CT scan.  Improving.  Urine culture with  multiple growth.  Anaplastic T-cell lymphoma on chemotherapy: Last chemo 4/22.  Followed by her oncologist.  Her fever is likely from her lymphoma.  Oncology on board.  Hypertension: Stable.  On metoprolol and Norvasc.  Sinus tachycardia: Patient developed burst of sinus tachycardia with underlying sinus arrhythmia.  Probably due to acute stress, fever.  Symptomatic treatment with metoprolol.  Will check electrolytes including magnesium and phosphorus.   DVT prophylaxis: Lovenox subcu Code Status: Full code Family Communication: None.  Patient talking to her sons. Disposition Plan: Status is: Inpatient  Remains inpatient appropriate because:Inpatient level of care appropriate due to severity of illness   Dispo: The patient is from: Home              Anticipated d/c is to: Home              Anticipated d/c date is: 2 days              Patient currently is not medically stable to d/c. Continues to have high-grade fever with unknown source of fever.         Consultants:   PCCM  Oncology  Infectious disease  Procedures:   Bronchoscopy and biopsy  Antimicrobials:  Antibiotics Given (last 72 hours)    Date/Time Action Medication Dose Rate   04/25/20 1046 New Bag/Given   vancomycin (VANCOCIN) IVPB 1000 mg/200 mL premix 1,000 mg 200 mL/hr   04/25/20 2234 Given   azithromycin (ZITHROMAX) tablet 500 mg 500 mg    04/25/20 2242 New Bag/Given   vancomycin (VANCOCIN) IVPB 1000 mg/200 mL premix 1,000 mg 200 mL/hr   04/25/20 2352 New Bag/Given  ceFEPIme (MAXIPIME) 2 g in sodium chloride 0.9 % 100 mL IVPB 2 g 200 mL/hr   04/26/20 0801 New Bag/Given   ceFEPIme (MAXIPIME) 2 g in sodium chloride 0.9 % 100 mL IVPB 2 g 200 mL/hr   04/26/20 1646 New Bag/Given   ceFEPIme (MAXIPIME) 2 g in sodium chloride 0.9 % 100 mL IVPB 2 g 200 mL/hr   04/26/20 2059 Given   azithromycin (ZITHROMAX) tablet 500 mg 500 mg          Subjective: Seen and examined.  No overnight events.  Now  coughing with some greenish sputum.  Denies any chest pain or palpitations.  Temperature 103 last 24 hours.  On room air.  Just feels tired. Objective: Vitals:   04/27/20 2208 04/27/20 2307 04/28/20 0251 04/28/20 0649  BP: 137/73 123/87 (!) 153/80 124/84  Pulse: (!) 103 97 (!) 115 100  Resp: (!) 24 (!) 26 (!) 24 20  Temp: 99.9 F (37.7 C) 100.2 F (37.9 C) 100.3 F (37.9 C) 98.9 F (37.2 C)  TempSrc: Oral Oral Oral Oral  SpO2: 100% 95% 93% 95%  Weight:      Height:        Intake/Output Summary (Last 24 hours) at 04/28/2020 1036 Last data filed at 04/28/2020 0900 Gross per 24 hour  Intake 370 ml  Output 0 ml  Net 370 ml   Filed Weights   04/22/20 1700 04/23/20 2102 04/26/20 2046  Weight: 100.7 kg 102.8 kg 107.7 kg    Examination:  General exam: Appears calm and comfortable, on room air.  Chronically sick looking. Respiratory system: Clear to auscultation. Respiratory effort normal.  No added sounds. A Port-A-Cath on the left chest. Cardiovascular system: S1 & S2 heard, RRR.  Tachycardic. Gastrointestinal system: Abdomen is nondistended, soft and nontender. No organomegaly or masses felt. Normal bowel sounds heard. Central nervous system: Alert and oriented. No focal neurological deficits. Extremities: Symmetric 5 x 5 power. Skin: No rashes, lesions or ulcers Psychiatry: Judgement and insight appear normal. Mood & affect appropriate.     Data Reviewed: I have personally reviewed following labs and imaging studies  CBC: Recent Labs  Lab 04/21/20 1935 04/22/20 0508 04/23/20 0621 04/24/20 0928 04/25/20 0552 04/27/20 0500 04/28/20 0310  WBC 9.5   < > 7.4 6.7 10.6* 10.6* 10.6*  NEUTROABS 6.4  --   --   --   --  7.5 6.9  HGB 10.2*   < > 9.3* 9.8* 10.4* 8.7* 8.4*  HCT 32.7*   < > 29.5* 31.9* 32.8* 28.2* 26.1*  MCV 85.6   < > 85.0 85.3 84.5 84.7 82.3  PLT 238   < > 200 176 217 173 189   < > = values in this interval not displayed.   Basic Metabolic Panel: Recent  Labs  Lab 04/21/20 1935 04/21/20 1935 04/22/20 0508 04/22/20 0508 04/23/20 0621 04/24/20 0928 04/25/20 0552 04/27/20 0500 04/28/20 0310  NA 137  --  139  --  137 141 140  --   --   K 2.9*  --  3.3*  --  3.6 4.1 4.1  --   --   CL 100  --  104  --  105 109 105  --   --   CO2 27  --  25  --  22 23 21*  --   --   GLUCOSE 105*  --  117*  --  119* 121* 103*  --   --   BUN 10  --  10  --  10 11 9   --   --   CREATININE 0.87   < > 0.67   < > 0.70 0.73 0.68 0.65 0.66  CALCIUM 8.8*  --  8.6*  --  8.4* 8.7* 9.0  --   --   MG  --   --   --   --  1.6* 1.8  --   --   --    < > = values in this interval not displayed.   GFR: Estimated Creatinine Clearance: 98.5 mL/min (by C-G formula based on SCr of 0.66 mg/dL). Liver Function Tests: Recent Labs  Lab 04/21/20 1935 04/23/20 0621 04/24/20 0928 04/25/20 0552  AST 36 81* 74* 78*  ALT 31 79* 91* 111*  ALKPHOS 57 50 46 62  BILITOT 0.8 0.7 0.5 0.5  PROT 7.5 6.6 6.3* 7.3  ALBUMIN 2.9* 2.4* 2.1* 2.4*   No results for input(s): LIPASE, AMYLASE in the last 168 hours. No results for input(s): AMMONIA in the last 168 hours. Coagulation Profile: Recent Labs  Lab 04/21/20 1935  INR 1.3*   Cardiac Enzymes: No results for input(s): CKTOTAL, CKMB, CKMBINDEX, TROPONINI in the last 168 hours. BNP (last 3 results) No results for input(s): PROBNP in the last 8760 hours. HbA1C: No results for input(s): HGBA1C in the last 72 hours. CBG: Recent Labs  Lab 04/27/20 1214 04/27/20 1302 04/27/20 1646 04/27/20 2055 04/28/20 0653  GLUCAP 68* 147* 72 193* 99   Lipid Profile: No results for input(s): CHOL, HDL, LDLCALC, TRIG, CHOLHDL, LDLDIRECT in the last 72 hours. Thyroid Function Tests: No results for input(s): TSH, T4TOTAL, FREET4, T3FREE, THYROIDAB in the last 72 hours. Anemia Panel: No results for input(s): VITAMINB12, FOLATE, FERRITIN, TIBC, IRON, RETICCTPCT in the last 72 hours. Sepsis Labs: Recent Labs  Lab 04/21/20 1935 04/21/20 2135  04/22/20 0508 04/23/20 0621  PROCALCITON  --   --  0.69 0.38  LATICACIDVEN 0.8 0.8  --   --     Recent Results (from the past 240 hour(s))  Urine culture     Status: Abnormal   Collection Time: 04/21/20  6:44 PM   Specimen: In/Out Cath Urine  Result Value Ref Range Status   Specimen Description   Final    IN/OUT CATH URINE Performed at [facility], [address], [place], [place] [address]    Special Requests   Final    NONE Performed at [facility], [address] [place], [place] [address]    Culture MULTIPLE SPECIES PRESENT, SUGGEST RECOLLECTION (A)  Final   Report Status 04/23/2020 FINAL  Final  Blood Culture (routine x 2)     Status: None   Collection Time: 04/21/20  7:35 PM   Specimen: Right Antecubital; Blood  Result Value Ref Range Status   Specimen Description RIGHT ANTECUBITAL  Final   Special Requests   Final    BOTTLES DRAWN AEROBIC AND ANAEROBIC Blood Culture adequate volume   Culture   Final    NO GROWTH 6 DAYS Performed at [facility], [address] [place], [place] [address]    Report Status 04/27/2020 FINAL  Final  Blood Culture (routine x 2)     Status: None   Collection Time: 04/21/20  8:05 PM   Specimen: Porta Cath; Blood  Result Value Ref Range Status   Specimen Description PORTA CATH DRAWN BY RN  Final   Special Requests   Final    BOTTLES DRAWN AEROBIC AND ANAEROBIC Blood Culture adequate volume  Culture   Final    NO GROWTH 6 DAYS Performed at [facility], [address], [place], [place] [address]    Report Status 04/27/2020 FINAL  Final  Respiratory Panel by RT PCR (Flu A&B, Covid) - Nasopharyngeal Swab     Status: None   Collection Time: 04/21/20  9:18 PM   Specimen: Nasopharyngeal Swab  Result Value Ref Range Status   SARS Coronavirus 2 by RT PCR NEGATIVE NEGATIVE Final    Comment: (NOTE) SARS-CoV-2 target nucleic acids are NOT DETECTED. The SARS-CoV-2 RNA is generally detectable in upper respiratoy specimens during the acute  phase of infection. The lowest concentration of SARS-CoV-2 viral copies this assay can detect is 131 copies/mL. A negative result does not preclude SARS-Cov-2 infection and should not be used as the sole basis for treatment or other patient management decisions. A negative result may occur with  improper specimen collection/handling, submission of specimen other than nasopharyngeal swab, presence of viral mutation(s) within the areas targeted by this assay, and inadequate number of viral copies (<131 copies/mL). A negative result must be combined with clinical observations, patient history, and epidemiological information. The expected result is Negative. Fact Sheet for Patients:  PinkCheek.be Fact Sheet for Healthcare Providers:  GravelBags.it This test is not yet ap proved or cleared by the [place] FDA and  has been authorized for detection and/or diagnosis of SARS-CoV-2 by FDA under an Emergency Use Authorization (EUA). This EUA will remain  in effect (meaning this test can be used) for the duration of the COVID-19 declaration under Section 564(b)(1) of the Act, 21 U.S.C. section 360bbb-3(b)(1), unless the authorization is terminated or revoked sooner.    Influenza A by PCR NEGATIVE NEGATIVE Final   Influenza B by PCR NEGATIVE NEGATIVE Final    Comment: (NOTE) The Xpert Xpress SARS-CoV-2/FLU/RSV assay is intended as an aid in  the diagnosis of influenza from Nasopharyngeal swab specimens and  should not be used as a sole basis for treatment. Nasal washings and  aspirates are unacceptable for Xpert Xpress SARS-CoV-2/FLU/RSV  testing. Fact Sheet for Patients: PinkCheek.be Fact Sheet for Healthcare Providers: GravelBags.it This test is not yet approved or cleared by the [place] FDA and  has been authorized for detection and/or diagnosis of SARS-CoV-2 by   FDA under an Emergency Use Authorization (EUA). This EUA will remain  in effect (meaning this test can be used) for the duration of the  Covid-19 declaration under Section 564(b)(1) of the Act, 21  U.S.C. section 360bbb-3(b)(1), unless the authorization is  terminated or revoked. Performed at [facility], [address] [place], [place] [address]   MRSA PCR Screening     Status: None   Collection Time: 04/24/20 12:19 AM  Result Value Ref Range Status   MRSA by PCR NEGATIVE NEGATIVE Final    Comment:        The GeneXpert MRSA Assay (FDA approved for NASAL specimens only), is one component of a comprehensive MRSA colonization surveillance program. It is not intended to diagnose MRSA infection nor to guide or monitor treatment for MRSA infections. Performed at [facility], [place] [address] [place], [place] [address]   Culture, respiratory     Status: None   Collection Time: 04/25/20  2:46 PM   Specimen: Bronchial Washing, Left; Lung  Result Value Ref Range Status   Specimen Description BRONCHIAL ALVEOLAR LAVAGE LEFT  Final   Special Requests NONE  Final   Gram Stain   Final  FEW WBC PRESENT, PREDOMINANTLY PMN NO ORGANISMS SEEN    Culture   Final    NO GROWTH 2 DAYS Performed at [facility] [address], [place], [place] [address]    Report Status 04/27/2020 FINAL  Final  Acid Fast Smear (AFB)     Status: None   Collection Time: 04/25/20  2:46 PM   Specimen: Bronchial Washing, Left; Lung  Result Value Ref Range Status   AFB Specimen Processing Concentration  Final   Acid Fast Smear Negative  Final    Comment: (NOTE) Performed At: [facility] [place], [place] [id number] Rush Farmer MD WV:[id number]    Source (AFB) BRONCHIAL ALVEOLAR LAVAGE  Final    Comment: LEFT         Radiology Studies: CT ABDOMEN PELVIS W CONTRAST  Result Date: 04/26/2020 CLINICAL DATA:  Fever. EXAM: CT ABDOMEN AND PELVIS WITH CONTRAST  TECHNIQUE: Multidetector CT imaging of the abdomen and pelvis was performed using the standard protocol following bolus administration of intravenous contrast. CONTRAST:  17mL OMNIPAQUE IOHEXOL 300 MG/ML  SOLN COMPARISON:  June 14, 2015. Apr 22, 2020. FINDINGS: Lower chest: Multiple pulmonary nodules are noted in the visualized lung bases consistent with metastatic disease. Mild right basilar atelectasis is noted. Small bilateral pleural effusions are noted. Hepatobiliary: No focal liver abnormality is seen. No gallstones, gallbladder wall thickening, or biliary dilatation. Pancreas: Unremarkable. No pancreatic ductal dilatation or surrounding inflammatory changes. Spleen: Normal in size without focal abnormality. Adrenals/Urinary Tract: Adrenal glands are unremarkable. Kidneys are normal, without renal calculi, focal lesion, or hydronephrosis. Bladder is unremarkable. Stomach/Bowel: Stomach is within normal limits. Appendix appears normal. No evidence of bowel wall thickening, distention, or inflammatory changes. Vascular/Lymphatic: No significant vascular findings are present. No enlarged abdominal or pelvic lymph nodes. Reproductive: Status post hysterectomy. No adnexal masses. Other: No abdominal wall hernia or abnormality. No abdominopelvic ascites. Musculoskeletal: No acute or significant osseous findings. IMPRESSION: 1. Multiple pulmonary nodules are noted in the visualized lung bases consistent with metastatic disease. 2. Small bilateral pleural effusions are noted. 3. No other abnormality seen in the abdomen or pelvis. Electronically Signed   By: Marijo Conception M.D.   On: 04/26/2020 20:45   DG CHEST PORT 1 VIEW  Result Date: 04/27/2020 CLINICAL DATA:  Fever and short breath. History of lung and renal cell carcinoma EXAM: PORTABLE CHEST 1 VIEW COMPARISON:  Apr 25, 2020 FINDINGS: There is diffuse nodular interstitial disease throughout the lungs. There is ill-defined airspace opacity in the right base  with small right pleural effusion. Heart size and pulmonary vascular normal. No adenopathy. There is aortic atherosclerosis. Port-A-Cath tip is in the superior vena cava near the cavoatrial junction. No bone lesions. IMPRESSION: Diffuse nodular interstitial disease with questionable lymphangitic spread of tumor given the clinical history in the numeral small nodular opacities. Consolidation with pleural effusion right base. No new opacity evident. Stable cardiac silhouette. No adenopathy. Stable Port-A-Cath position. Aortic Atherosclerosis (ICD10-I70.0). Electronically Signed   By: Lowella Grip III M.D.   On: 04/27/2020 13:50        Scheduled Meds: . Chlorhexidine Gluconate Cloth  6 each Topical Daily  . enoxaparin (LOVENOX) injection  40 mg Subcutaneous QHS  . insulin aspart  0-5 Units Subcutaneous QHS  . insulin aspart  0-9 Units Subcutaneous TID WC  . metoprolol tartrate  25 mg Oral BID  . montelukast  10 mg Oral QHS  . rosuvastatin  10 mg Oral Daily  . sodium  chloride flush  10-40 mL Intracatheter Q12H   Continuous Infusions:    LOS: 7 days    Time spent: 25 minutes    Barb Merino, MD [facility] Pager [phone]

## 2020-04-28 NOTE — Plan of Care (Signed)
°  Problem: Coping: °Goal: Level of anxiety will decrease °Outcome: Progressing °  °

## 2020-04-29 LAB — CBC WITH DIFFERENTIAL/PLATELET
Abs Immature Granulocytes: 0.12 10*3/uL — ABNORMAL HIGH (ref 0.00–0.07)
Basophils Absolute: 0 10*3/uL (ref 0.0–0.1)
Basophils Relative: 0 %
Eosinophils Absolute: 0 10*3/uL (ref 0.0–0.5)
Eosinophils Relative: 0 %
HCT: 27.3 % — ABNORMAL LOW (ref 36.0–46.0)
Hemoglobin: 8.6 g/dL — ABNORMAL LOW (ref 12.0–15.0)
Immature Granulocytes: 1 %
Lymphocytes Relative: 19 %
Lymphs Abs: 2.1 10*3/uL (ref 0.7–4.0)
MCH: 26.3 pg (ref 26.0–34.0)
MCHC: 31.5 g/dL (ref 30.0–36.0)
MCV: 83.5 fL (ref 80.0–100.0)
Monocytes Absolute: 1.8 10*3/uL — ABNORMAL HIGH (ref 0.1–1.0)
Monocytes Relative: 16 %
Neutro Abs: 7.1 10*3/uL (ref 1.7–7.7)
Neutrophils Relative %: 64 %
Platelets: 188 10*3/uL (ref 150–400)
RBC: 3.27 MIL/uL — ABNORMAL LOW (ref 3.87–5.11)
RDW: 17.1 % — ABNORMAL HIGH (ref 11.5–15.5)
WBC: 11.2 10*3/uL — ABNORMAL HIGH (ref 4.0–10.5)
nRBC: 0 % (ref 0.0–0.2)

## 2020-04-29 LAB — COMPREHENSIVE METABOLIC PANEL
ALT: 77 U/L — ABNORMAL HIGH (ref 0–44)
AST: 53 U/L — ABNORMAL HIGH (ref 15–41)
Albumin: 2 g/dL — ABNORMAL LOW (ref 3.5–5.0)
Alkaline Phosphatase: 65 U/L (ref 38–126)
Anion gap: 9 (ref 5–15)
BUN: 10 mg/dL (ref 6–20)
CO2: 29 mmol/L (ref 22–32)
Calcium: 9 mg/dL (ref 8.9–10.3)
Chloride: 101 mmol/L (ref 98–111)
Creatinine, Ser: 0.68 mg/dL (ref 0.44–1.00)
GFR calc Af Amer: >60 mL/min (ref >60)
GFR calc non Af Amer: >60 mL/min (ref >60)
Glucose, Bld: 110 mg/dL — ABNORMAL HIGH (ref 70–99)
Potassium: 3.5 mmol/L (ref 3.5–5.1)
Sodium: 139 mmol/L (ref 135–145)
Total Bilirubin: 0.6 mg/dL (ref 0.3–1.2)
Total Protein: 6.5 g/dL (ref 6.5–8.1)

## 2020-04-29 LAB — GLUCOSE, CAPILLARY
Glucose-Capillary: 106 mg/dL — ABNORMAL HIGH (ref 70–99)
Glucose-Capillary: 98 mg/dL (ref 70–99)
Glucose-Capillary: 105 mg/dL — ABNORMAL HIGH (ref 70–99)
Glucose-Capillary: 139 mg/dL — ABNORMAL HIGH (ref 70–99)

## 2020-04-29 MED ORDER — POTASSIUM CHLORIDE CRYS ER 20 MEQ PO TBCR
20.0000 meq | EXTENDED_RELEASE_TABLET | Freq: Two times a day (BID) | ORAL | Status: AC
  Administered 2020-04-29 – 2020-04-30 (×4): 20 meq via ORAL
  Filled 2020-04-29 (×4): qty 1

## 2020-04-29 MED ORDER — MAGNESIUM SULFATE 2 GM/50ML IV SOLN
2.0000 g | Freq: Once | INTRAVENOUS | Status: AC
  Administered 2020-04-29: 2 g via INTRAVENOUS
  Filled 2020-04-29: qty 50

## 2020-04-29 NOTE — Progress Notes (Signed)
PROGRESS NOTE    Megan Mcknight  EQA:[id number] DOB: 1961-05-28 DOA: 04/21/2020 PCP: Rosita Fire, MD    Brief Narrative:  59 year old female with history of anaplastic T-cell lymphoma diagnosed in 2014, status post stem cell transplant in 2015, relapse on 11/2019 and currently on Adcetris followed by Dr. Alen Blew, type 2 diabetes on insulin, hyperlipidemia presented to the emergency room with about 2-week history of right lower back pain.  She was found to have temperature of 102.9.  Sepsis work-up initiated.  CT chest showed new innumerable pulmonary nodules.  For bronchoscopy and further diagnostic, she was transferred to [facility] from [facility]. 5/12: Bronchoscopy and biopsy, results pending 5/14: All infection work-up negative, still persistent fever.   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  Treated with antibiotics in the setting of immunocompromised state. Patient was on vancomycin, cefepime and azithromycin.  Discontinued. Bronc biopsy and lavage, cultures pending. Bronc biopsy pending.  Cytology with atypical cells. Hepatitis negative.  Cryptococcal antigen negative.  Type 2 diabetes with hyperglycemia.  Blood sugars still low.  All her long-acting insulin has been discontinued.  Remains on sensitive sliding scale.  Better today.  Lower back pain: Negative CT scan.  Improving.  Urine culture with  multiple growth.  Anaplastic T-cell lymphoma on chemotherapy: Last chemo 4/22.  Followed by her oncologist.  Her fever is likely from her lymphoma.  Oncology on board.  Hypertension: Stable.  On metoprolol and Norvasc.  Sinus tachycardia: Patient developed burst of sinus tachycardia with underlying sinus arrhythmia.  Probably due to acute stress, fever.  Symptomatic treatment with metoprolol.  Replace magnesium.   DVT prophylaxis: Lovenox subcu Code Status: Full code Family Communication: None.  Patient talking to her sons. Disposition Plan: Status is: Inpatient  Remains inpatient appropriate because:Inpatient level of care appropriate due to severity of illness   Dispo: The patient is from: Home              Anticipated d/c is to: Home              Anticipated d/c date is: 2 days              Patient currently is not medically stable to d/c. Continues to have high-grade fever with unknown source of fever.         Consultants:   PCCM  Oncology  Infectious disease  Procedures:   Bronchoscopy and biopsy  Antimicrobials:  Antibiotics Given (last 72 hours)    Date/Time Action Medication Dose Rate   04/26/20 1646 New Bag/Given   ceFEPIme (MAXIPIME) 2 g in sodium chloride 0.9 % 100 mL IVPB 2 g 200 mL/hr   04/26/20 2059 Given   azithromycin (ZITHROMAX) tablet 500 mg 500 mg          Subjective: Patient seen and examined.  No overnight events.  Continues to have temperature.  Tired.  She was  totally exhausted on getting out of bed and sitting in chair.  Objective: Vitals:   04/28/20 2109 04/29/20 0029 04/29/20 0520 04/29/20 0957  BP: (!) 149/88 103/72 126/82 116/75  Pulse: (!) 117 94 (!) 110 (!) 112  Resp: (!) 25 20 (!) 26 18  Temp: (!) 100.9 F (38.3 C) 98.6 F (37 C) (!) 100.8 F (38.2 C) 99.5 F (37.5 C)  TempSrc: Oral Oral Oral Oral  SpO2: 99% 97% 97% 93%  Weight:      Height:        Intake/Output Summary (Last 24 hours) at 04/29/2020 1148 Last data  filed at 04/29/2020 0854 Gross per 24 hour  Intake 680 ml  Output 350 ml  Net 330 ml   Filed Weights   04/22/20 1700 04/23/20 2102 04/26/20 2046  Weight: 100.7 kg 102.8 kg 107.7 kg    Examination:  General exam: Appears calm and comfortable, on room air.  Chronically sick looking.  Anxious. Respiratory system: Clear to auscultation. Respiratory effort normal.  No added sounds. A Port-A-Cath on the left chest. Cardiovascular system: S1 & S2 heard, RRR.  Tachycardic. Gastrointestinal system: Abdomen is nondistended, soft and nontender. No organomegaly or masses felt. Normal bowel sounds heard. Central nervous system: Alert and oriented. No focal neurological deficits. Extremities: Symmetric 5 x 5 power. Skin: No rashes, lesions or ulcers     Data Reviewed: I have personally reviewed following labs and imaging studies  CBC: Recent Labs  Lab 04/24/20 0928 04/25/20 0552 04/27/20 0500 04/28/20 0310 04/29/20 0413  WBC 6.7 10.6* 10.6* 10.6* 11.2*  NEUTROABS  --   --  7.5 6.9 7.1  HGB 9.8* 10.4* 8.7* 8.4* 8.6*  HCT 31.9* 32.8* 28.2* 26.1* 27.3*  MCV 85.3 84.5 84.7 82.3 83.5  PLT 176 217 173 189 096   Basic Metabolic Panel: Recent Labs  Lab 04/23/20 0621 04/23/20 0621 04/24/20 0928 04/25/20 0552 04/27/20 0500 04/28/20 0310 04/29/20 0413  NA 137  --  141 140  --   --  139  K 3.6  --  4.1 4.1  --   --  3.5  CL 105  --  109 105  --   --  101  CO2 22  --  23 21*  --   --  29  GLUCOSE 119*  --  121* 103*  --   --  110*  BUN 10  --  11 9  --   --  10  CREATININE 0.70   < > 0.73 0.68 0.65 0.66 0.68  CALCIUM 8.4*  --  8.7* 9.0  --   --  9.0  MG 1.6*  --  1.8  --   --  1.6*  --   PHOS  --   --   --   --   --  3.7  --    < > = values in this interval not displayed.   GFR: Estimated Creatinine Clearance: 98.5 mL/min (by C-G formula based on SCr of 0.68 mg/dL). Liver Function Tests: Recent Labs  Lab 04/23/20 0621 04/24/20 0928 04/25/20 0552 04/29/20 0413  AST 81* 74*  78* 53*  ALT 79* 91* 111* 77*  ALKPHOS 50 46 62 65  BILITOT 0.7 0.5 0.5 0.6  PROT 6.6 6.3* 7.3 6.5  ALBUMIN 2.4* 2.1* 2.4* 2.0*   No results for input(s): LIPASE, AMYLASE in the last 168 hours. No results for input(s): AMMONIA in the last 168 hours. Coagulation Profile: No results for input(s): INR, PROTIME in the  last 168 hours. Cardiac Enzymes: No results for input(s): CKTOTAL, CKMB, CKMBINDEX, TROPONINI in the last 168 hours. BNP (last 3 results) No results for input(s): PROBNP in the last 8760 hours. HbA1C: No results for input(s): HGBA1C in the last 72 hours. CBG: Recent Labs  Lab 04/28/20 1637 04/28/20 1800 04/28/20 2101 04/29/20 0700 04/29/20 1129  GLUCAP 69* 109* 84 106* 98   Lipid Profile: No results for input(s): CHOL, HDL, LDLCALC, TRIG, CHOLHDL, LDLDIRECT in the last 72 hours. Thyroid Function Tests: No results for input(s): TSH, T4TOTAL, FREET4, T3FREE, THYROIDAB in the last 72 hours. Anemia Panel: No results for input(s): VITAMINB12, FOLATE, FERRITIN, TIBC, IRON, RETICCTPCT in the last 72 hours. Sepsis Labs: Recent Labs  Lab 04/23/20 0621  PROCALCITON 0.38    Recent Results (from the past 240 hour(s))  Urine culture     Status: Abnormal   Collection Time: 04/21/20  6:44 PM   Specimen: In/Out Cath Urine  Result Value Ref Range Status   Specimen Description   Final    IN/OUT CATH URINE Performed at [facility], [address], [place], [place] [address]    Special Requests   Final    NONE Performed at [facility], [address], [place], [place] [address]    Culture MULTIPLE SPECIES PRESENT, SUGGEST RECOLLECTION (A)  Final   Report Status 04/23/2020 FINAL  Final  Blood Culture (routine x 2)     Status: None   Collection Time: 04/21/20  7:35 PM   Specimen: Right Antecubital; Blood  Result Value Ref Range Status   Specimen Description RIGHT ANTECUBITAL  Final   Special Requests   Final    BOTTLES DRAWN AEROBIC AND ANAEROBIC Blood Culture  adequate volume   Culture   Final    NO GROWTH 6 DAYS Performed at [facility], [address] [place], [place] [address]    Report Status 04/27/2020 FINAL  Final  Blood Culture (routine x 2)     Status: None   Collection Time: 04/21/20  8:05 PM   Specimen: Porta Cath; Blood  Result Value Ref Range Status   Specimen Description PORTA CATH DRAWN BY RN  Final   Special Requests   Final    BOTTLES DRAWN AEROBIC AND ANAEROBIC Blood Culture adequate volume   Culture   Final    NO GROWTH 6 DAYS Performed at [facility], [address] [place], [place] [address]    Report Status 04/27/2020 FINAL  Final  Respiratory Panel by RT PCR (Flu A&B, Covid) - Nasopharyngeal Swab     Status: None   Collection Time: 04/21/20  9:18 PM   Specimen: Nasopharyngeal Swab  Result Value Ref Range Status   SARS Coronavirus 2 by RT PCR NEGATIVE NEGATIVE Final    Comment: (NOTE) SARS-CoV-2 target nucleic acids are NOT DETECTED. The SARS-CoV-2 RNA is generally detectable in upper respiratoy specimens during the acute phase of infection. The lowest concentration of SARS-CoV-2 viral copies this assay can detect is 131 copies/mL. A negative result does not preclude SARS-Cov-2 infection and should not be used as the sole basis for treatment or other patient management decisions. A negative result may occur with  improper specimen collection/handling, submission of specimen other than nasopharyngeal swab, presence of viral mutation(s) within the areas targeted by this assay, and inadequate number of viral copies (<131 copies/mL). A negative result must be combined with clinical observations, patient history, and epidemiological information. The expected result is Negative. Fact Sheet for Patients:  PinkCheek.be Fact Sheet  for Healthcare Providers:  GravelBags.it This test is not yet ap proved or cleared by the [place] and  has been authorized  for detection and/or diagnosis of SARS-CoV-2 by FDA under an Emergency Use Authorization (EUA). This EUA will remain  in effect (meaning this test can be used) for the duration of the COVID-19 declaration under Section 564(b)(1) of the Act, 21 U.S.C. section 360bbb-3(b)(1), unless the authorization is terminated or revoked sooner.    Influenza A by PCR NEGATIVE NEGATIVE Final   Influenza B by PCR NEGATIVE NEGATIVE Final    Comment: (NOTE) The Xpert Xpress SARS-CoV-2/FLU/RSV assay is intended as an aid in  the diagnosis of influenza from Nasopharyngeal swab specimens and  should not be used as a sole basis for treatment. Nasal washings and  aspirates are unacceptable for Xpert Xpress SARS-CoV-2/FLU/RSV  testing. Fact Sheet for Patients: PinkCheek.be Fact Sheet for Healthcare Providers: GravelBags.it This test is not yet approved or cleared by the [place] FDA and  has been authorized for detection and/or diagnosis of SARS-CoV-2 by  FDA under an Emergency Use Authorization (EUA). This EUA will remain  in effect (meaning this test can be used) for the duration of the  Covid-19 declaration under Section 564(b)(1) of the Act, 21  U.S.C. section 360bbb-3(b)(1), unless the authorization is  terminated or revoked. Performed at [facility], [address], [place], [place] [address]   MRSA PCR Screening     Status: None   Collection Time: 04/24/20 12:19 AM  Result Value Ref Range Status   MRSA by PCR NEGATIVE NEGATIVE Final    Comment:        The GeneXpert MRSA Assay (FDA approved for NASAL specimens only), is one component of a comprehensive MRSA colonization surveillance program. It is not intended to diagnose MRSA infection nor to guide or monitor treatment for MRSA infections. Performed at [facility], [place] [address] [place], [place] [address]   Fungus Culture With Stain     Status: None (Preliminary result)    Collection Time: 04/25/20  2:46 PM   Specimen: Bronchial Washing, Left; Lung  Result Value Ref Range Status   Fungus Stain Final report  Final    Comment: (NOTE) Performed At: [facility] [place], [place] [id number] Rush Farmer MD [id number]    Fungus (Mycology) Culture PENDING  Incomplete   Fungal Source BRONCHIAL ALVEOLAR LAVAGE  Final    Comment: LEFT  Culture, respiratory     Status: None   Collection Time: 04/25/20  2:46 PM   Specimen: Bronchial Washing, Left; Lung  Result Value Ref Range Status   Specimen Description BRONCHIAL ALVEOLAR LAVAGE LEFT  Final   Special Requests NONE  Final   Gram Stain   Final    FEW WBC PRESENT, PREDOMINANTLY PMN NO ORGANISMS SEEN    Culture   Final    NO GROWTH 2 DAYS Performed at [facility], [place] [address], [place], [place] [address]    Report Status 04/27/2020 FINAL  Final  Acid Fast Smear (AFB)     Status: None   Collection Time: 04/25/20  2:46 PM   Specimen: Bronchial Washing, Left; Lung  Result Value Ref Range Status   AFB Specimen Processing Concentration  Final   Acid Fast Smear Negative  Final    Comment: (NOTE) Performed At: [facility] [address] [place], [place] [id number] Rush Farmer MD [id number]    Source (AFB) BRONCHIAL ALVEOLAR LAVAGE  Final  Comment: LEFT  Fungus Culture Result     Status: None   Collection Time: 04/25/20  2:46 PM  Result Value Ref Range Status   Result 1 Comment  Final    Comment: (NOTE) KOH/Calcofluor preparation:  no fungus observed. Performed At: [facility] [place], [place] [id number] Rush Farmer MD VX:[id number]          Radiology Studies: DG CHEST PORT 1 VIEW  Result Date: 04/27/2020 CLINICAL DATA:  Fever and short breath. History of lung and renal cell carcinoma EXAM: PORTABLE CHEST 1 VIEW COMPARISON:  Apr 25, 2020 FINDINGS: There is diffuse nodular interstitial disease throughout the lungs.  There is ill-defined airspace opacity in the right base with small right pleural effusion. Heart size and pulmonary vascular normal. No adenopathy. There is aortic atherosclerosis. Port-A-Cath tip is in the superior vena cava near the cavoatrial junction. No bone lesions. IMPRESSION: Diffuse nodular interstitial disease with questionable lymphangitic spread of tumor given the clinical history in the numeral small nodular opacities. Consolidation with pleural effusion right base. No new opacity evident. Stable cardiac silhouette. No adenopathy. Stable Port-A-Cath position. Aortic Atherosclerosis (ICD10-I70.0). Electronically Signed   By: Lowella Grip III M.D.   On: 04/27/2020 13:50        Scheduled Meds: . Chlorhexidine Gluconate Cloth  6 each Topical Daily  . enoxaparin (LOVENOX) injection  40 mg Subcutaneous QHS  . insulin aspart  0-5 Units Subcutaneous QHS  . insulin aspart  0-9 Units Subcutaneous TID WC  . metoprolol tartrate  25 mg Oral BID  . montelukast  10 mg Oral QHS  . rosuvastatin  10 mg Oral Daily  . sodium chloride flush  10-40 mL Intracatheter Q12H   Continuous Infusions:    LOS: 8 days    Time spent: 25 minutes    Barb Merino, MD [facility] Pager [phone]

## 2020-04-29 NOTE — Plan of Care (Signed)
  Problem: Activity: Goal: Risk for activity intolerance will decrease Outcome: Progressing   Problem: Coping: Goal: Level of anxiety will decrease Outcome: Progressing   Problem: Pain Managment: Goal: General experience of comfort will improve Outcome: Progressing   

## 2020-04-29 NOTE — Plan of Care (Signed)
  Problem: Pain Managment: Goal: General experience of comfort will improve Outcome: Progressing   

## 2020-04-29 NOTE — Plan of Care (Signed)
  Problem: Activity: Goal: Risk for activity intolerance will decrease Outcome: Progressing   

## 2020-04-30 LAB — COMPREHENSIVE METABOLIC PANEL
ALT: 86 U/L — ABNORMAL HIGH (ref 0–44)
AST: 62 U/L — ABNORMAL HIGH (ref 15–41)
Albumin: 1.9 g/dL — ABNORMAL LOW (ref 3.5–5.0)
Alkaline Phosphatase: 70 U/L (ref 38–126)
Anion gap: 8 (ref 5–15)
BUN: 11 mg/dL (ref 6–20)
CO2: 28 mmol/L (ref 22–32)
Calcium: 8.8 mg/dL — ABNORMAL LOW (ref 8.9–10.3)
Chloride: 101 mmol/L (ref 98–111)
Creatinine, Ser: 0.7 mg/dL (ref 0.44–1.00)
GFR calc Af Amer: >60 mL/min (ref >60)
GFR calc non Af Amer: >60 mL/min (ref >60)
Glucose, Bld: 117 mg/dL — ABNORMAL HIGH (ref 70–99)
Potassium: 4 mmol/L (ref 3.5–5.1)
Sodium: 137 mmol/L (ref 135–145)
Total Bilirubin: 0.6 mg/dL (ref 0.3–1.2)
Total Protein: 6.4 g/dL — ABNORMAL LOW (ref 6.5–8.1)

## 2020-04-30 LAB — CBC WITH DIFFERENTIAL/PLATELET
Abs Immature Granulocytes: 0.1 10*3/uL — ABNORMAL HIGH (ref 0.00–0.07)
Basophils Absolute: 0 10*3/uL (ref 0.0–0.1)
Basophils Relative: 0 %
Eosinophils Absolute: 0 10*3/uL (ref 0.0–0.5)
Eosinophils Relative: 0 %
HCT: 26.3 % — ABNORMAL LOW (ref 36.0–46.0)
Hemoglobin: 8.2 g/dL — ABNORMAL LOW (ref 12.0–15.0)
Immature Granulocytes: 1 %
Lymphocytes Relative: 20 %
Lymphs Abs: 2.2 10*3/uL (ref 0.7–4.0)
MCH: 26.2 pg (ref 26.0–34.0)
MCHC: 31.2 g/dL (ref 30.0–36.0)
MCV: 84 fL (ref 80.0–100.0)
Monocytes Absolute: 1.6 10*3/uL — ABNORMAL HIGH (ref 0.1–1.0)
Monocytes Relative: 15 %
Neutro Abs: 6.9 10*3/uL (ref 1.7–7.7)
Neutrophils Relative %: 64 %
Platelets: 155 10*3/uL (ref 150–400)
RBC: 3.13 MIL/uL — ABNORMAL LOW (ref 3.87–5.11)
RDW: 17.1 % — ABNORMAL HIGH (ref 11.5–15.5)
WBC: 10.9 10*3/uL — ABNORMAL HIGH (ref 4.0–10.5)
nRBC: 0 % (ref 0.0–0.2)

## 2020-04-30 LAB — GLUCOSE, CAPILLARY
Glucose-Capillary: 140 mg/dL — ABNORMAL HIGH (ref 70–99)
Glucose-Capillary: 98 mg/dL (ref 70–99)
Glucose-Capillary: 75 mg/dL (ref 70–99)
Glucose-Capillary: 91 mg/dL (ref 70–99)

## 2020-04-30 NOTE — Progress Notes (Signed)
PROGRESS NOTE    Megan Mcknight  HGD:[id number] DOB: 10-26-1961 DOA: 04/21/2020 PCP: Rosita Fire, MD    Brief Narrative:  59 year old female with history of anaplastic T-cell lymphoma diagnosed in 2014, status post stem cell transplant in 2015, relapse on 11/2019 and currently on Adcetris followed by Dr. Alen Blew, type 2 diabetes on insulin, hyperlipidemia presented to the emergency room with about 2-week history of right lower back pain.  She was found to have temperature of 102.9.  Sepsis work-up initiated.  CT chest showed new innumerable pulmonary nodules.  For bronchoscopy and further diagnostic, she was transferred to [facility]. 5/12: Bronchoscopy and biopsy, results pending 5/14: All infection work-up negative, still persistent fever.   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  Treated with antibiotics in the setting of immunocompromised state. Patient was on vancomycin, cefepime and azithromycin.  Discontinued. Bronc biopsy and lavage, cultures pending.  Negative so far. Bronc biopsy pending.  Cytology with atypical cells. Hepatitis negative.  Cryptococcal antigen negative. Waiting for oncology input.  Type 2 diabetes with hyperglycemia.  Blood sugars still low normal.  All her long-acting insulin has been discontinued.  Remains on sensitive sliding scale.  Better today.  Lower back pain:  Negative CT scan.  Improving.  Urine culture with multiple growth.  Anaplastic T-cell lymphoma on chemotherapy: Last chemo 4/22.  Followed by her oncologist.  Her fever is likely from her lymphoma.  Oncology on board.  Hypertension: Stable.  On metoprolol and Norvasc.  Sinus tachycardia: Patient developed burst of sinus tachycardia with underlying sinus arrhythmia.  Probably due to acute stress, fever.  Symptomatic treatment with metoprolol.  Electrolytes were replaced.   DVT prophylaxis: Lovenox subcu Code Status: Full code Family Communication: None.  Patient talking to her sons. Disposition Plan: Status is: Inpatient  Remains inpatient appropriate because:Inpatient level of care appropriate due to severity of illness  Continues to have fever.  Waiting oncology input.   Dispo: The patient is from: Home              Anticipated d/c is to: Home              Anticipated d/c date is: 2 days              Patient currently is not medically stable to d/c. Continues to have high-grade fever with unknown source of fever.  Waiting oncology input.         Consultants:   PCCM  Oncology  Infectious disease  Procedures:   Bronchoscopy and biopsy  Antimicrobials:  Antibiotics Given (last 72 hours)    None         Subjective: Seen and examined.  No overnight events other than ongoing temperature, 103 max.  Tired.  Objective: Vitals:   04/29/20 2336 04/30/20 0231 04/30/20 0600 04/30/20 0940  BP: (!) 137/93 112/74 131/79 102/71  Pulse: Marland Kitchen)  113 (!) 101 (!) 111 (!) 101  Resp: 20 (!) 24 (!) 24 (!) 21  Temp: (!) 103.1 F (39.5 C) 98.9 F (37.2 C) (!) 100.5 F (38.1 C) 98.8 F (37.1 C)  TempSrc: Oral Oral Oral Oral  SpO2:  97% 97% 95%  Weight:      Height:        Intake/Output Summary (Last 24 hours) at 04/30/2020 1239 Last data filed at 04/30/2020 0900 Gross per 24 hour  Intake 710 ml  Output 550 ml  Net 160 ml   Filed Weights   04/23/20 2102 04/26/20 2046  04/29/20 2111  Weight: 102.8 kg 107.7 kg 94 kg    Examination:  General exam: Appears calm and comfortable, tired and anxious.  Respiratory system: Clear to auscultation. Respiratory effort normal.  No added sounds. A Port-A-Cath on the left chest. Cardiovascular system: S1 & S2 heard, RRR.  Tachycardic. Gastrointestinal system: Abdomen is nondistended, soft and nontender. No organomegaly or masses felt. Normal bowel sounds heard. Central nervous system: Alert and oriented. No focal neurological deficits. Extremities: Symmetric 5 x 5 power. Skin: No rashes, lesions or ulcers     Data Reviewed: I have personally reviewed following labs and imaging studies  CBC: Recent Labs  Lab 04/25/20 0552 04/27/20 0500 04/28/20 0310 04/29/20 0413 04/30/20 0412  WBC 10.6* 10.6* 10.6* 11.2* 10.9*  NEUTROABS  --  7.5 6.9 7.1 6.9  HGB 10.4* 8.7* 8.4* 8.6* 8.2*  HCT 32.8* 28.2* 26.1* 27.3* 26.3*  MCV 84.5 84.7 82.3 83.5 84.0  PLT 217 173 189 188 250   Basic Metabolic Panel: Recent Labs  Lab 04/24/20 0928 04/24/20 0928 04/25/20 0552 04/27/20 0500 04/28/20 0310 04/29/20 0413 04/30/20 0412  NA 141  --  140  --   --  139 137  K 4.1  --  4.1  --   --  3.5 4.0  CL 109  --  105  --   --  101 101  CO2 23  --  21*  --   --  29 28  GLUCOSE 121*  --  103*  --   --  110* 117*  BUN 11  --  9  --   --  10 11  CREATININE 0.73   < > 0.68 0.65 0.66 0.68 0.70  CALCIUM 8.7*  --  9.0  --   --  9.0 8.8*  MG 1.8  --   --   --  1.6*  --   --   PHOS  --   --   --   --  3.7  --   --    < > = values in this interval not displayed.   GFR: Estimated Creatinine Clearance: 91.8 mL/min (by C-G formula based on SCr of 0.7 mg/dL). Liver Function Tests: Recent Labs  Lab 04/24/20 0928 04/25/20 0552 04/29/20 0413 04/30/20 0412  AST 74* 78* 53* 62*  ALT 91* 111* 77* 86*  ALKPHOS 46 62 65 70  BILITOT 0.5 0.5 0.6 0.6  PROT 6.3* 7.3 6.5 6.4*  ALBUMIN 2.1* 2.4* 2.0* 1.9*   No results for input(s):  LIPASE, AMYLASE in the last 168 hours. No results for input(s): AMMONIA in the last 168 hours. Coagulation Profile: No results for input(s): INR, PROTIME in the last 168 hours. Cardiac Enzymes: No results for input(s): CKTOTAL, CKMB, CKMBINDEX, TROPONINI in the last 168 hours. BNP (last 3 results) No results for input(s): PROBNP in the last 8760 hours. HbA1C: No results for input(s):  HGBA1C in the last 72 hours. CBG: Recent Labs  Lab 04/29/20 1129 04/29/20 1626 04/29/20 2031 04/30/20 0723 04/30/20 1103  GLUCAP 98 105* 139* 140* 98   Lipid Profile: No results for input(s): CHOL, HDL, LDLCALC, TRIG, CHOLHDL, LDLDIRECT in the last 72 hours. Thyroid Function Tests: No results for input(s): TSH, T4TOTAL, FREET4, T3FREE, THYROIDAB in the last 72 hours. Anemia Panel: No results for input(s): VITAMINB12, FOLATE, FERRITIN, TIBC, IRON, RETICCTPCT in the last 72 hours. Sepsis Labs: No results for input(s): PROCALCITON, LATICACIDVEN in the last 168 hours.  Recent Results (from the past 240 hour(s))  Urine culture     Status: Abnormal   Collection Time: 04/21/20  6:44 PM   Specimen: In/Out Cath Urine  Result Value Ref Range Status   Specimen Description   Final    IN/OUT CATH URINE Performed at [facility], [address] [place], [place] [address]    Special Requests   Final    NONE Performed at [facility], [address], [place], [place] [address]    Culture MULTIPLE SPECIES PRESENT, SUGGEST RECOLLECTION (A)  Final   Report Status 04/23/2020 FINAL  Final  Blood Culture (routine x 2)     Status: None   Collection Time: 04/21/20  7:35 PM   Specimen: Right Antecubital; Blood  Result Value Ref Range Status   Specimen Description RIGHT ANTECUBITAL  Final   Special Requests   Final    BOTTLES DRAWN AEROBIC AND ANAEROBIC Blood Culture adequate volume   Culture   Final    NO GROWTH 6 DAYS Performed at [facility], [address], [place], [place] [address]    Report Status  04/27/2020 FINAL  Final  Blood Culture (routine x 2)     Status: None   Collection Time: 04/21/20  8:05 PM   Specimen: Porta Cath; Blood  Result Value Ref Range Status   Specimen Description PORTA CATH DRAWN BY RN  Final   Special Requests   Final    BOTTLES DRAWN AEROBIC AND ANAEROBIC Blood Culture adequate volume   Culture   Final    NO GROWTH 6 DAYS Performed at [facility], [address] [place], [place] [address]    Report Status 04/27/2020 FINAL  Final  Respiratory Panel by RT PCR (Flu A&B, Covid) - Nasopharyngeal Swab     Status: None   Collection Time: 04/21/20  9:18 PM   Specimen: Nasopharyngeal Swab  Result Value Ref Range Status   SARS Coronavirus 2 by RT PCR NEGATIVE NEGATIVE Final    Comment: (NOTE) SARS-CoV-2 target nucleic acids are NOT DETECTED. The SARS-CoV-2 RNA is generally detectable in upper respiratoy specimens during the acute phase of infection. The lowest concentration of SARS-CoV-2 viral copies this assay can detect is 131 copies/mL. A negative result does not preclude SARS-Cov-2 infection and should not be used as the sole basis for treatment or other patient management decisions. A negative result may occur with  improper specimen collection/handling, submission of specimen other than nasopharyngeal swab, presence of viral mutation(s) within the areas targeted by this assay, and inadequate number of viral copies (<131 copies/mL). A negative result must be combined with clinical observations, patient history, and epidemiological information. The expected result is Negative. Fact Sheet for Patients:  PinkCheek.be Fact Sheet for Healthcare Providers:  GravelBags.it This test is not yet ap proved or cleared by the [place] FDA and  has been authorized for detection and/or diagnosis of SARS-CoV-2 by FDA under an Emergency Use Authorization (  EUA). This EUA will remain  in effect (meaning this  test can be used) for the duration of the COVID-19 declaration under Section 564(b)(1) of the Act, 21 U.S.C. section 360bbb-3(b)(1), unless the authorization is terminated or revoked sooner.    Influenza A by PCR NEGATIVE NEGATIVE Final   Influenza B by PCR NEGATIVE NEGATIVE Final    Comment: (NOTE) The Xpert Xpress SARS-CoV-2/FLU/RSV assay is intended as an aid in  the diagnosis of influenza from Nasopharyngeal swab specimens and  should not be used as a sole basis for treatment. Nasal washings and  aspirates are unacceptable for Xpert Xpress SARS-CoV-2/FLU/RSV  testing. Fact Sheet for Patients: PinkCheek.be Fact Sheet for Healthcare Providers: GravelBags.it This test is not yet approved or cleared by the [place] FDA and  has been authorized for detection and/or diagnosis of SARS-CoV-2 by  FDA under an Emergency Use Authorization (EUA). This EUA will remain  in effect (meaning this test can be used) for the duration of the  Covid-19 declaration under Section 564(b)(1) of the Act, 21  U.S.C. section 360bbb-3(b)(1), unless the authorization is  terminated or revoked. Performed at [facility], [address], [place], [place] [address]   MRSA PCR Screening     Status: None   Collection Time: 04/24/20 12:19 AM  Result Value Ref Range Status   MRSA by PCR NEGATIVE NEGATIVE Final    Comment:        The GeneXpert MRSA Assay (FDA approved for NASAL specimens only), is one component of a comprehensive MRSA colonization surveillance program. It is not intended to diagnose MRSA infection nor to guide or monitor treatment for MRSA infections. Performed at [facility], [place] [address] [place], [place] [address]   Fungus Culture With Stain     Status: None (Preliminary result)   Collection Time: 04/25/20  2:46 PM   Specimen: Bronchial Washing, Left; Lung  Result Value Ref Range Status   Fungus Stain Final report   Final    Comment: (NOTE) Performed At: [facility] [place], [place] [id number] Rush Farmer MD [id number]    Fungus (Mycology) Culture PENDING  Incomplete   Fungal Source BRONCHIAL ALVEOLAR LAVAGE  Final    Comment: LEFT  Culture, respiratory     Status: None   Collection Time: 04/25/20  2:46 PM   Specimen: Bronchial Washing, Left; Lung  Result Value Ref Range Status   Specimen Description BRONCHIAL ALVEOLAR LAVAGE LEFT  Final   Special Requests NONE  Final   Gram Stain   Final    FEW WBC PRESENT, PREDOMINANTLY PMN NO ORGANISMS SEEN    Culture   Final    NO GROWTH 2 DAYS Performed at [facility], [place] [address] [place], [place] [address]    Report Status 04/27/2020 FINAL  Final  Acid Fast Smear (AFB)     Status: None   Collection Time: 04/25/20  2:46 PM   Specimen: Bronchial Washing, Left; Lung  Result Value Ref Range Status   AFB Specimen Processing Concentration  Final   Acid Fast Smear Negative  Final    Comment: (NOTE) Performed At: [facility] [place], [place] [id number] Rush Farmer MD TZ:[id number]    Source (AFB) BRONCHIAL ALVEOLAR LAVAGE  Final    Comment: LEFT  Fungus Culture Result     Status: None   Collection Time: 04/25/20  2:46 PM  Result Value Ref Range Status   Result 1 Comment  Final  Comment: (NOTE) KOH/Calcofluor preparation:  no fungus observed. Performed At: [facility] [place], [place] [id number] Rush Farmer MD PZ:[id number]          Radiology Studies: No results found.      Scheduled Meds: . Chlorhexidine Gluconate Cloth  6 each Topical Daily  . enoxaparin (LOVENOX) injection  40 mg Subcutaneous QHS  . insulin aspart  0-5 Units Subcutaneous QHS  . insulin aspart  0-9 Units Subcutaneous TID WC  . metoprolol tartrate  25 mg Oral BID  . montelukast  10 mg Oral QHS  . potassium chloride  20 mEq Oral BID  . rosuvastatin  10 mg Oral Daily   . sodium chloride flush  10-40 mL Intracatheter Q12H   Continuous Infusions:    LOS: 9 days    Time spent: 25 minutes    Barb Merino, MD [facility] Pager [phone]

## 2020-04-30 NOTE — Progress Notes (Signed)
[facility]  Date of Admission:  04/21/2020      Total days of antibiotics 6  Stopped Azithromycin + Cefepime + Vancomycin 5/13           ASSESSMENT: Megan Mcknight is a 59 y.o. female with a history of anaplastic T-cell lymphoma hospitalized with persistent high fevers. Chest CT reveal rapid development of b/l multinodular pulmonary metastasis and innumerable nodules within left and right lung new from PET scan 02-2020. She is now s/p bronchoscopy from 5/12 - "atypical cells" mentioned on pathology. AFB smear negative and fungal stain negative. All serology for fungal work up has since been negative as well. Strong suspicion her fevers are related to malignancy.    PLAN: 1. No indication for antibiotic therapy  2. Will follow peripherally for oncology notes and AFB/Fungal culture maturity.    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 Chlorhexidine Gluconate Cloth  6 each Topical Daily  . enoxaparin (LOVENOX) injection  40 mg Subcutaneous QHS  . insulin aspart  0-5 Units Subcutaneous QHS  . insulin aspart  0-9 Units Subcutaneous TID WC  . metoprolol tartrate  25 mg Oral BID  . montelukast  10 mg Oral QHS  . potassium chloride  20 mEq Oral BID  . rosuvastatin  10 mg Oral Daily  . sodium chloride flush  10-40 mL Intracatheter Q12H    SUBJECTIVE: She states she is short winded today. No more than yesterday necessarily.  She feels that this is exactly how she presented when her cancer  came the first time with fever pattern.    Review of Systems  Constitutional: Positive for chills, diaphoresis, fever and malaise/fatigue.  HENT: Negative for sore throat.   Respiratory: Positive for shortness of breath. Negative for cough.   Cardiovascular: Negative for chest pain and leg swelling.  Gastrointestinal: Negative for abdominal pain, nausea and vomiting.  Genitourinary: Negative for dysuria.  Musculoskeletal: Negative for back pain, joint pain and myalgias.  Skin: Negative for rash.  Neurological: Negative for dizziness and weakness.    Allergies  Allergen Reactions  . Iodine Anaphylaxis    Told to avoid due to shellfish allergy. Told to avoid due to shellfish allergy.  Marland Kitchen Shellfish-Derived Products Anaphylaxis and Swelling    Eyes swelling, scratchy throat, bumps on lips.    OBJECTIVE: Vitals:   04/29/20 2336 04/30/20 0231 04/30/20 0600 04/30/20 0940  BP: (!) 137/93 112/74 131/79 102/71  Pulse: (!) 113 (!) 101 (!) 111 (!) 101  Resp: 20 (!) 24 (!) 24 (!) 21  Temp: (!) 103.1 F (39.5 C) 98.9 F (37.2 C) (!) 100.5 F (38.1 C) 98.8 F (37.1 C)  TempSrc: Oral Oral Oral Oral  SpO2:  97% 97% 95%  Weight:      Height:       Body mass index is 31.52 kg/m.  Physical Exam Vitals reviewed.  Constitutional:      Comments: Resting in bed. Short of  breath with conversation.   HENT:     Mouth/Throat:     Mouth: Mucous membranes are moist.     Pharynx: Oropharynx is clear.  Eyes:     General: No scleral icterus.    Pupils: Pupils are equal, round, and reactive to light.  Cardiovascular:     Rate and Rhythm: Regular rhythm. Tachycardia present.     Pulses: Normal pulses.     Heart sounds: No murmur.  Pulmonary:     Effort: Pulmonary effort is normal.     Breath sounds: Normal breath sounds.  Skin:    General: Skin is warm and dry.     Capillary Refill: Capillary refill takes less than 2 seconds.  Neurological:     Mental Status: She is alert and oriented to  person, place, and time.     Lab Results Lab Results  Component Value Date   WBC 10.9 (H) 04/30/2020   HGB 8.2 (L) 04/30/2020   HCT 26.3 (L) 04/30/2020   MCV 84.0 04/30/2020   PLT 155 04/30/2020    Lab Results  Component Value Date   CREATININE 0.70 04/30/2020   BUN 11 04/30/2020   NA 137 04/30/2020   K 4.0 04/30/2020   CL 101 04/30/2020   CO2 28 04/30/2020    Lab Results  Component Value Date   ALT 86 (H) 04/30/2020   AST 62 (H) 04/30/2020   ALKPHOS 70 04/30/2020   BILITOT 0.6 04/30/2020     Microbiology: Recent Results (from the past 240 hour(s))  Urine culture     Status: Abnormal   Collection Time: 04/21/20  6:44 PM   Specimen: In/Out Cath Urine  Result Value Ref Range Status   Specimen Description   Final    IN/OUT CATH URINE Performed at [facility], [address], [place], [place] [address]    Special Requests   Final    NONE Performed at [facility], [address] [place], [place] [address]    Culture MULTIPLE SPECIES PRESENT, SUGGEST RECOLLECTION (A)  Final   Report Status 04/23/2020 FINAL  Final  Blood Culture (routine x 2)     Status: None   Collection Time: 04/21/20  7:35 PM   Specimen: Right Antecubital; Blood  Result Value Ref Range Status   Specimen Description RIGHT ANTECUBITAL  Final   Special Requests   Final    BOTTLES DRAWN AEROBIC AND ANAEROBIC Blood Culture adequate volume   Culture   Final    NO GROWTH 6 DAYS Performed at [facility], [address], [place], [place] [address]    Report Status 04/27/2020 FINAL  Final  Blood Culture (routine x 2)     Status: None   Collection Time: 04/21/20  8:05 PM   Specimen: Porta Cath; Blood  Result Value Ref Range Status   Specimen Description PORTA CATH DRAWN BY RN  Final   Special Requests   Final    BOTTLES DRAWN AEROBIC AND ANAEROBIC Blood Culture adequate volume   Culture   Final    NO GROWTH 6 DAYS Performed at [facility], [address] [place],  [address]    Report  Status 04/27/2020 FINAL  Final  Respiratory Panel by RT PCR (Flu A&B, Covid) - Nasopharyngeal Swab     Status: None   Collection Time: 04/21/20  9:18 PM   Specimen: Nasopharyngeal Swab  Result Value Ref Range Status   SARS Coronavirus 2 by RT PCR NEGATIVE NEGATIVE Final    Comment: (NOTE)  SARS-CoV-2 target nucleic acids are NOT DETECTED. The SARS-CoV-2 RNA is generally detectable in upper respiratoy specimens during the acute phase of infection. The lowest concentration of SARS-CoV-2 viral copies this assay can detect is 131 copies/mL. A negative result does not preclude SARS-Cov-2 infection and should not be used as the sole basis for treatment or other patient management decisions. A negative result may occur with  improper specimen collection/handling, submission of specimen other than nasopharyngeal swab, presence of viral mutation(s) within the areas targeted by this assay, and inadequate number of viral copies (<131 copies/mL). A negative result must be combined with clinical observations, patient history, and epidemiological information. The expected result is Negative. Fact Sheet for Patients:  PinkCheek.be Fact Sheet for Healthcare Providers:  GravelBags.it This test is not yet ap proved or cleared by the [place] FDA and  has been authorized for detection and/or diagnosis of SARS-CoV-2 by FDA under an Emergency Use Authorization (EUA). This EUA will remain  in effect (meaning this test can be used) for the duration of the COVID-19 declaration under Section 564(b)(1) of the Act, 21 U.S.C. section 360bbb-3(b)(1), unless the authorization is terminated or revoked sooner.    Influenza A by PCR NEGATIVE NEGATIVE Final   Influenza B by PCR NEGATIVE NEGATIVE Final    Comment: (NOTE) The Xpert Xpress SARS-CoV-2/FLU/RSV assay is intended as an aid in  the diagnosis of influenza from Nasopharyngeal swab specimens and   should not be used as a sole basis for treatment. Nasal washings and  aspirates are unacceptable for Xpert Xpress SARS-CoV-2/FLU/RSV  testing. Fact Sheet for Patients: PinkCheek.be Fact Sheet for Healthcare Providers: GravelBags.it This test is not yet approved or cleared by the [place] FDA and  has been authorized for detection and/or diagnosis of SARS-CoV-2 by  FDA under an Emergency Use Authorization (EUA). This EUA will remain  in effect (meaning this test can be used) for the duration of the  Covid-19 declaration under Section 564(b)(1) of the Act, 21  U.S.C. section 360bbb-3(b)(1), unless the authorization is  terminated or revoked. Performed at [facility], [address] [place], [place] [address]   MRSA PCR Screening     Status: None   Collection Time: 04/24/20 12:19 AM  Result Value Ref Range Status   MRSA by PCR NEGATIVE NEGATIVE Final    Comment:        The GeneXpert MRSA Assay (FDA approved for NASAL specimens only), is one component of a comprehensive MRSA colonization surveillance program. It is not intended to diagnose MRSA infection nor to guide or monitor treatment for MRSA infections. Performed at [facility], [place] [address] [place], [place] [address]   Fungus Culture With Stain     Status: None (Preliminary result)   Collection Time: 04/25/20  2:46 PM   Specimen: Bronchial Washing, Left; Lung  Result Value Ref Range Status   Fungus Stain Final report  Final    Comment: (NOTE) Performed At: [facility] [place], [place] [id number] Rush Farmer MD IR:[id number]    Fungus (Mycology) Culture PENDING  Incomplete   Fungal Source BRONCHIAL ALVEOLAR LAVAGE  Final    Comment: LEFT  Culture, respiratory     Status: None   Collection Time: 04/25/20  2:46 PM   Specimen: Bronchial Washing, Left; Lung  Result Value Ref Range Status   Specimen Description BRONCHIAL  ALVEOLAR LAVAGE LEFT  Final   Special Requests NONE  Final   Gram Stain   Final  FEW WBC PRESENT, PREDOMINANTLY PMN NO ORGANISMS SEEN    Culture   Final    NO GROWTH 2 DAYS Performed at [facility] [address], [place], [place] [address]    Report Status 04/27/2020 FINAL  Final  Acid Fast Smear (AFB)     Status: None   Collection Time: 04/25/20  2:46 PM   Specimen: Bronchial Washing, Left; Lung  Result Value Ref Range Status   AFB Specimen Processing Concentration  Final   Acid Fast Smear Negative  Final    Comment: (NOTE) Performed At: [facility] [place], [place] [id number] Rush Farmer MD [id number]    Source (AFB) BRONCHIAL ALVEOLAR LAVAGE  Final    Comment: LEFT  Fungus Culture Result     Status: None   Collection Time: 04/25/20  2:46 PM  Result Value Ref Range Status   Result 1 Comment  Final    Comment: (NOTE) KOH/Calcofluor preparation:  no fungus observed. Performed At: [facility] [address] [place], [place] [id number] Rush Farmer MD DJ:[id number]     Janene Madeira, MSN, NP-C [facility] [email or web address] Pager: [phone] Office: [place]: [phone]

## 2020-05-01 DIAGNOSIS — C8460 Anaplastic large cell lymphoma, ALK-positive, unspecified site: Secondary | ICD-10-CM

## 2020-05-01 DIAGNOSIS — R0600 Dyspnea, unspecified: Secondary | ICD-10-CM

## 2020-05-01 LAB — COMPREHENSIVE METABOLIC PANEL
ALT: 88 U/L — ABNORMAL HIGH (ref 0–44)
AST: 62 U/L — ABNORMAL HIGH (ref 15–41)
Albumin: 1.9 g/dL — ABNORMAL LOW (ref 3.5–5.0)
Alkaline Phosphatase: 68 U/L (ref 38–126)
Anion gap: 9 (ref 5–15)
BUN: 12 mg/dL (ref 6–20)
CO2: 26 mmol/L (ref 22–32)
Calcium: 9 mg/dL (ref 8.9–10.3)
Chloride: 103 mmol/L (ref 98–111)
Creatinine, Ser: 0.65 mg/dL (ref 0.44–1.00)
GFR calc Af Amer: >60 mL/min (ref >60)
GFR calc non Af Amer: >60 mL/min (ref >60)
Glucose, Bld: 105 mg/dL — ABNORMAL HIGH (ref 70–99)
Potassium: 4.3 mmol/L (ref 3.5–5.1)
Sodium: 138 mmol/L (ref 135–145)
Total Bilirubin: 0.5 mg/dL (ref 0.3–1.2)
Total Protein: 6.5 g/dL (ref 6.5–8.1)

## 2020-05-01 LAB — CBC WITH DIFFERENTIAL/PLATELET
Abs Immature Granulocytes: 0.11 10*3/uL — ABNORMAL HIGH (ref 0.00–0.07)
Basophils Absolute: 0 10*3/uL (ref 0.0–0.1)
Basophils Relative: 1 %
Eosinophils Absolute: 0.1 10*3/uL (ref 0.0–0.5)
Eosinophils Relative: 1 %
HCT: 25.8 % — ABNORMAL LOW (ref 36.0–46.0)
Hemoglobin: 7.9 g/dL — ABNORMAL LOW (ref 12.0–15.0)
Immature Granulocytes: 1 %
Lymphocytes Relative: 20 %
Lymphs Abs: 1.7 10*3/uL (ref 0.7–4.0)
MCH: 25.8 pg — ABNORMAL LOW (ref 26.0–34.0)
MCHC: 30.6 g/dL (ref 30.0–36.0)
MCV: 84.3 fL (ref 80.0–100.0)
Monocytes Absolute: 1.4 10*3/uL — ABNORMAL HIGH (ref 0.1–1.0)
Monocytes Relative: 16 %
Neutro Abs: 5.3 10*3/uL (ref 1.7–7.7)
Neutrophils Relative %: 61 %
Platelets: 132 10*3/uL — ABNORMAL LOW (ref 150–400)
RBC: 3.06 MIL/uL — ABNORMAL LOW (ref 3.87–5.11)
RDW: 17.2 % — ABNORMAL HIGH (ref 11.5–15.5)
WBC: 8.6 10*3/uL (ref 4.0–10.5)
nRBC: 0 % (ref 0.0–0.2)

## 2020-05-01 LAB — GLUCOSE, CAPILLARY
Glucose-Capillary: 82 mg/dL (ref 70–99)
Glucose-Capillary: 117 mg/dL — ABNORMAL HIGH (ref 70–99)
Glucose-Capillary: 124 mg/dL — ABNORMAL HIGH (ref 70–99)
Glucose-Capillary: 229 mg/dL — ABNORMAL HIGH (ref 70–99)

## 2020-05-01 LAB — CYTOLOGY - NON PAP

## 2020-05-01 LAB — SURGICAL PATHOLOGY

## 2020-05-01 MED ORDER — BOOST / RESOURCE BREEZE PO LIQD CUSTOM
1.0000 | Freq: Three times a day (TID) | ORAL | Status: DC
  Administered 2020-05-01 – 2020-05-04 (×10): 1 via ORAL

## 2020-05-01 MED ORDER — PRO-STAT SUGAR FREE PO LIQD
30.0000 mL | Freq: Three times a day (TID) | ORAL | Status: DC
  Administered 2020-05-01 – 2020-05-04 (×11): 30 mL via ORAL
  Filled 2020-05-01 (×11): qty 30

## 2020-05-01 MED ORDER — PREDNISONE 50 MG PO TABS
60.0000 mg | ORAL_TABLET | Freq: Every day | ORAL | Status: DC
  Administered 2020-05-01 – 2020-05-02 (×2): 60 mg via ORAL
  Filled 2020-05-01 (×2): qty 1

## 2020-05-01 NOTE — Progress Notes (Signed)
IP PROGRESS NOTE  Subjective:   Megan Mcknight reports no major changes and her health.  She continues to have intermittent fevers and mild dyspnea on exertion.  She is ambulating short distances with assistance although she has not been able to ambulate outside.  She denies any nausea, vomiting or abdominal pain.  He denies any lymphadenopathy.  Objective:  Vital signs in last 24 hours: Temp:  [97.8 F (36.6 C)-101.9 F (38.8 C)] 101.9 F (38.8 C) (05/18 0850) Pulse Rate:  [88-124] 105 (05/18 1200) Resp:  [20-24] 20 (05/18 0850) BP: (103-145)/(65-93) 136/93 (05/18 0850) SpO2:  [93 %-100 %] 97 % (05/18 0850) Weight:  [211 lb 10.3 oz (96 kg)] 211 lb 10.3 oz (96 kg) (05/17 2100) Weight change: 4 lb 5.5 oz (1.969 kg) Last BM Date: 04/30/20  Intake/Output from previous day: 05/17 0701 - 05/18 0700 In: 880 [P.O.:880] Out: 0  General: Alert, awake without distress. Head: Normocephalic atraumatic. Mouth: mucous membranes moist, pharynx normal without lesions Eyes: No scleral icterus.  Pupils are equal and round reactive to light. Resp: Expiratory wheezes noted. Cardio: regular rate and rhythm, S1, S2 normal, no murmur, click, rub or gallop GI: soft, non-tender; bowel sounds normal; no masses,  no organomegaly Musculoskeletal: No joint deformity or effusion. Neurological: No motor, sensory deficits.  Intact deep tendon reflexes. Skin: No rashes or lesions.  Portacath remains accessed without difficulties.  Lab Results: Recent Labs    04/30/20 0412 05/01/20 0426  WBC 10.9* 8.6  HGB 8.2* 7.9*  HCT 26.3* 25.8*  PLT 155 132*    BMET Recent Labs    04/30/20 0412 05/01/20 0426  NA 137 138  K 4.0 4.3  CL 101 103  CO2 28 26  GLUCOSE 117* 105*  BUN 11 12  CREATININE 0.70 0.65  CALCIUM 8.8* 9.0    Studies/Results: No results found.  Medications: I have reviewed the patient's current medications.  Assessment/Plan:  59 year old woman with:  1.  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  She has been receiving salvage therapy with Adcetris and achieved excellent response based on imaging studies in March 2021.  She was hospitalized with fever and pulmonary nodules suggestive of relapsed disease.  Disease status was updated again and discussed today.  Different salvage therapy options were reiterated.  We will await the results of her pulmonary biopsy before we make any definitive recommendations at this time.  Any treatment would likely be an outpatient.   2.  Bilateral pulmonary nodules: Infectious etiologies have been evaluated and at this time appears to be ruled out.  Biopsy results are only pending.   3.  Fevers and dyspnea: Likely related to her pulmonary process and highly suspicious for lymphoma relapse.  I recommended trial of prednisone at 60 mg daily for 5 days with close monitoring of her blood sugar in the next 24 hours   4.  Disposition: I have no objection to discharge in the next 24 to 48 hours if no issues with prednisone is noted.  25  minutes were dedicated to this visit.  More than 50% time was dedicated to updating her disease status, reviewing treatment options and future plan of care.     LOS: 10 days   Zola Button 05/01/2020, 12:35 PM

## 2020-05-01 NOTE — Progress Notes (Signed)
PROGRESS NOTE    Megan Mcknight  FXT:[id number] DOB: November 05, 1961 DOA: 04/21/2020 PCP: Rosita Fire, MD    Brief Narrative:  59 year old female with history of anaplastic T-cell lymphoma diagnosed in 2014, status post stem cell transplant in 2015, relapse on 11/2019 and currently on Adcetris followed by Dr. Alen Blew, type 2 diabetes on insulin, hyperlipidemia presented to the emergency room with about 2-week history of right lower back pain.  She was found to have temperature of 102.9.  Sepsis work-up initiated.  CT chest showed new innumerable pulmonary nodules.  For bronchoscopy and further diagnostic, she was transferred to [facility]. 5/12: Bronchoscopy and biopsy, results pending 5/14-5/18: All infection work-up negative, still persistent fever.   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 probably due to T-cell lymphoma.  Treated with antibiotics in the setting of immunocompromised state. Patient was on vancomycin, cefepime and azithromycin.  Discontinued. Bronc biopsy and lavage, cultures negative so far.   Bronc biopsy pending.  Cytology with atypical cells. Hepatitis negative.  Cryptococcal antigen negative. Waiting for oncology input.  Type 2 diabetes with hyperglycemia.  Blood sugars still low normal.  All her long-acting insulin has been discontinued.  Remains on sensitive sliding scale.  Better today.  Lower back pain:  Negative CT scan.  Improving.  Urine culture with multiple growth.  Anaplastic T-cell lymphoma on chemotherapy: Last chemo 4/22.  Followed by her oncologist.  Her fever is likely from her lymphoma.  Oncology on board.  Hypertension: Stable.  On metoprolol and Norvasc.  Sinus tachycardia: Patient developed burst of sinus tachycardia with underlying sinus arrhythmia.  Probably due to acute stress, fever.  Symptomatic treatment with metoprolol.  Electrolytes were replaced.  Communicated with Dr. Alen Blew regarding results.  DVT prophylaxis: Lovenox subcu Code Status: Full code Family Communication: None.  Patient talking to her sons. Disposition Plan: Status is: Inpatient  Remains inpatient appropriate because:Inpatient level of care appropriate due to severity of illness  Continues to have fever.  Waiting oncology input.   Dispo: The patient is from: Home              Anticipated d/c is to: Home              Anticipated d/c date is: 2 days              Patient currently is not medically stable to d/c. Continues to have high-grade fever with unknown source of fever.  Waiting oncology input.         Consultants:   PCCM  Oncology  Infectious disease  Procedures:   Bronchoscopy and biopsy  Antimicrobials:  Antibiotics Given (last 72 hours)    None         Subjective: Seen and examined.  No overnight events.  Continues to have fever as high as 103.  Feels tired on minimal mobility, otherwise no definitive symptoms.  Objective: Vitals:   04/30/20  2100 05/01/20 0148 05/01/20 0501 05/01/20 0850  BP: (!) 145/84 103/68 126/83 (!) 136/93  Pulse: (!) 116 95 88 (!) 124  Resp: (!) 24 20 20 20   Temp: 98.3 F (36.8 C) 98.1 F (36.7 C) 97.8 F (36.6 C) (!) 101.9 F (38.8 C)  TempSrc: Oral Oral Oral Oral  SpO2: 96% 93% 100% 97%  Weight: 96 kg     Height:        Intake/Output Summary (Last 24 hours) at 05/01/2020 1057 Last data filed at 05/01/2020 0505 Gross per 24  hour  Intake 760 ml  Output 0 ml  Net 760 ml   Filed Weights   04/26/20 2046 04/29/20 2111 04/30/20 2100  Weight: 107.7 kg 94 kg 96 kg    Examination:  General exam: Appears calm and comfortable, chronically sick looking.  Not in any distress. Respiratory system: Clear to auscultation. Respiratory effort normal.  No added sounds. A Port-A-Cath on the left chest. Cardiovascular system: S1 & S2 heard, RRR.  Tachycardic. Gastrointestinal system: Abdomen is nondistended, soft and nontender. No organomegaly or masses felt. Normal bowel sounds heard. Central nervous system: Alert and oriented. No focal neurological deficits. Extremities: Symmetric 5 x 5 power. Skin: No rashes, lesions or ulcers     Data Reviewed: I have personally reviewed following labs and imaging studies  CBC: Recent Labs  Lab 04/27/20 0500 04/28/20 0310 04/29/20 0413 04/30/20 0412 05/01/20 0426  WBC 10.6* 10.6* 11.2* 10.9* 8.6  NEUTROABS 7.5 6.9 7.1 6.9 5.3  HGB 8.7* 8.4* 8.6* 8.2* 7.9*  HCT 28.2* 26.1* 27.3* 26.3* 25.8*  MCV 84.7 82.3 83.5 84.0 84.3  PLT 173 189 188 155 329*   Basic Metabolic Panel: Recent Labs  Lab 04/25/20 0552 04/25/20 0552 04/27/20 0500 04/28/20 0310 04/29/20 0413 04/30/20 0412 05/01/20 0426  NA 140  --   --   --  139 137 138  K 4.1  --   --   --  3.5 4.0 4.3  CL 105  --   --   --  101 101 103  CO2 21*  --   --   --  29 28 26   GLUCOSE 103*  --   --   --  110* 117* 105*  BUN 9  --   --   --  10 11 12   CREATININE 0.68   < > 0.65 0.66 0.68 0.70 0.65  CALCIUM 9.0  --   --   --  9.0 8.8* 9.0  MG  --   --   --  1.6*  --   --   --   PHOS  --   --   --  3.7  --   --   --    < > = values in this interval not displayed.   GFR: Estimated Creatinine Clearance: 92.8 mL/min (by C-G formula based on SCr of 0.65 mg/dL). Liver Function Tests: Recent Labs  Lab 04/25/20 0552 04/29/20 0413 04/30/20 0412 05/01/20 0426  AST 78* 53* 62* 62*  ALT 111* 77* 86* 88*  ALKPHOS 62 65 70  68  BILITOT 0.5 0.6 0.6 0.5  PROT 7.3 6.5 6.4* 6.5  ALBUMIN 2.4* 2.0* 1.9* 1.9*   No results for input(s): LIPASE, AMYLASE in the last 168 hours. No results for input(s): AMMONIA in the last 168 hours. Coagulation Profile: No results for input(s): INR, PROTIME in the last 168 hours. Cardiac Enzymes: No results for input(s): CKTOTAL, CKMB, CKMBINDEX, TROPONINI in the last 168 hours. BNP (  last 3 results) No results for input(s): PROBNP in the last 8760 hours. HbA1C: No results for input(s): HGBA1C in the last 72 hours. CBG: Recent Labs  Lab 04/30/20 0723 04/30/20 1103 04/30/20 1630 04/30/20 2134 05/01/20 0723  GLUCAP 140* 98 75 91 82   Lipid Profile: No results for input(s): CHOL, HDL, LDLCALC, TRIG, CHOLHDL, LDLDIRECT in the last 72 hours. Thyroid Function Tests: No results for input(s): TSH, T4TOTAL, FREET4, T3FREE, THYROIDAB in the last 72 hours. Anemia Panel: No results for input(s): VITAMINB12, FOLATE, FERRITIN, TIBC, IRON, RETICCTPCT in the last 72 hours. Sepsis Labs: No results for input(s): PROCALCITON, LATICACIDVEN in the last 168 hours.  Recent Results (from the past 240 hour(s))  Urine culture     Status: Abnormal   Collection Time: 04/21/20  6:44 PM   Specimen: In/Out Cath Urine  Result Value Ref Range Status   Specimen Description   Final    IN/OUT CATH URINE Performed at [facility], [address] [place], [place] [address]    Special Requests   Final    NONE Performed at [facility], [address], [place], [place] [address]    Culture MULTIPLE SPECIES PRESENT, SUGGEST RECOLLECTION (A)  Final   Report Status 04/23/2020 FINAL  Final  Blood Culture (routine x 2)     Status: None   Collection Time: 04/21/20  7:35 PM   Specimen: Right Antecubital; Blood  Result Value Ref Range Status   Specimen Description RIGHT ANTECUBITAL  Final   Special Requests   Final    BOTTLES DRAWN AEROBIC AND ANAEROBIC Blood Culture adequate volume   Culture   Final    NO  GROWTH 6 DAYS Performed at [facility], [address] [place], [place] [address]    Report Status 04/27/2020 FINAL  Final  Blood Culture (routine x 2)     Status: None   Collection Time: 04/21/20  8:05 PM   Specimen: Porta Cath; Blood  Result Value Ref Range Status   Specimen Description PORTA CATH DRAWN BY RN  Final   Special Requests   Final    BOTTLES DRAWN AEROBIC AND ANAEROBIC Blood Culture adequate volume   Culture   Final    NO GROWTH 6 DAYS Performed at [facility], [address] [place], [place] [address]    Report Status 04/27/2020 FINAL  Final  Respiratory Panel by RT PCR (Flu A&B, Covid) - Nasopharyngeal Swab     Status: None   Collection Time: 04/21/20  9:18 PM   Specimen: Nasopharyngeal Swab  Result Value Ref Range Status   SARS Coronavirus 2 by RT PCR NEGATIVE NEGATIVE Final    Comment: (NOTE) SARS-CoV-2 target nucleic acids are NOT DETECTED. The SARS-CoV-2 RNA is generally detectable in upper respiratoy specimens during the acute phase of infection. The lowest concentration of SARS-CoV-2 viral copies this assay can detect is 131 copies/mL. A negative result does not preclude SARS-Cov-2 infection and should not be used as the sole basis for treatment or other patient management decisions. A negative result may occur with  improper specimen collection/handling, submission of specimen other than nasopharyngeal swab, presence of viral mutation(s) within the areas targeted by this assay, and inadequate number of viral copies (<131 copies/mL). A negative result must be combined with clinical observations, patient history, and epidemiological information. The expected result is Negative. Fact Sheet for Patients:  PinkCheek.be Fact Sheet for Healthcare Providers:  GravelBags.it This test is not yet ap proved or cleared by the [place] FDA  and  has been authorized for detection and/or diagnosis of  SARS-CoV-2 by FDA under an Emergency Use Authorization (EUA). This EUA will remain  in effect (meaning this test can be used) for the duration of the COVID-19 declaration under Section 564(b)(1) of the Act, 21 U.S.C. section 360bbb-3(b)(1), unless the authorization is terminated or revoked sooner.    Influenza A by PCR NEGATIVE NEGATIVE Final   Influenza B by PCR NEGATIVE NEGATIVE Final    Comment: (NOTE) The Xpert Xpress SARS-CoV-2/FLU/RSV assay is intended as an aid in  the diagnosis of influenza from Nasopharyngeal swab specimens and  should not be used as a sole basis for treatment. Nasal washings and  aspirates are unacceptable for Xpert Xpress SARS-CoV-2/FLU/RSV  testing. Fact Sheet for Patients: PinkCheek.be Fact Sheet for Healthcare Providers: GravelBags.it This test is not yet approved or cleared by the [place] FDA and  has been authorized for detection and/or diagnosis of SARS-CoV-2 by  FDA under an Emergency Use Authorization (EUA). This EUA will remain  in effect (meaning this test can be used) for the duration of the  Covid-19 declaration under Section 564(b)(1) of the Act, 21  U.S.C. section 360bbb-3(b)(1), unless the authorization is  terminated or revoked. Performed at [facility], [address] [place], [place] [address]   MRSA PCR Screening     Status: None   Collection Time: 04/24/20 12:19 AM  Result Value Ref Range Status   MRSA by PCR NEGATIVE NEGATIVE Final    Comment:        The GeneXpert MRSA Assay (FDA approved for NASAL specimens only), is one component of a comprehensive MRSA colonization surveillance program. It is not intended to diagnose MRSA infection nor to guide or monitor treatment for MRSA infections. Performed at [facility], [place] [address] [place] [address], [place], [place] [address]   Fungus Culture With Stain     Status: None (Preliminary result)   Collection Time: 04/25/20  2:46  PM   Specimen: Bronchial Washing, Left; Lung  Result Value Ref Range Status   Fungus Stain Final report  Final    Comment: (NOTE) Performed At: [facility] [place], [place] [id number] Rush Farmer MD [id number]    Fungus (Mycology) Culture PENDING  Incomplete   Fungal Source BRONCHIAL ALVEOLAR LAVAGE  Final    Comment: LEFT  Culture, respiratory     Status: None   Collection Time: 04/25/20  2:46 PM   Specimen: Bronchial Washing, Left; Lung  Result Value Ref Range Status   Specimen Description BRONCHIAL ALVEOLAR LAVAGE LEFT  Final   Special Requests NONE  Final   Gram Stain   Final    FEW WBC PRESENT, PREDOMINANTLY PMN NO ORGANISMS SEEN    Culture   Final    NO GROWTH 2 DAYS Performed at [facility], [place] [address] [place], [place] [address]    Report Status 04/27/2020 FINAL  Final  Acid Fast Smear (AFB)     Status: None   Collection Time: 04/25/20  2:46 PM   Specimen: Bronchial Washing, Left; Lung  Result Value Ref Range Status   AFB Specimen Processing Concentration  Final   Acid Fast Smear Negative  Final    Comment: (NOTE) Performed At: [facility] [place], [place] [id number] Rush Farmer MD [id number]    Source (AFB) BRONCHIAL ALVEOLAR LAVAGE  Final    Comment: LEFT  Fungus Culture Result     Status: None   Collection Time:  04/25/20  2:46 PM  Result Value Ref Range Status   Result 1 Comment  Final    Comment: (NOTE) KOH/Calcofluor preparation:  no fungus observed. Performed At: [facility] [place], [place] [id number] Rush Farmer MD [id number]          Radiology Studies: No results found.      Scheduled Meds: . Chlorhexidine Gluconate Cloth  6 each Topical Daily  . enoxaparin (LOVENOX) injection  40 mg Subcutaneous QHS  . insulin aspart  0-5 Units Subcutaneous QHS  . insulin aspart  0-9 Units Subcutaneous TID WC  . metoprolol tartrate  25 mg Oral BID   . montelukast  10 mg Oral QHS  . rosuvastatin  10 mg Oral Daily  . sodium chloride flush  10-40 mL Intracatheter Q12H   Continuous Infusions:    LOS: 10 days    Time spent: 25 minutes    Barb Merino, MD [facility] Pager [phone]

## 2020-05-01 NOTE — Progress Notes (Addendum)
Initial Nutrition Assessment  DOCUMENTATION CODES:   Obesity unspecified  INTERVENTION:  -62ml Pro-stat TID, each supplement provides 100 kcals and 15 grams protein -Boost Breeze po TID, each supplement provides 250 kcal and 9 grams of protein -Recommend liberalizing diet, secure chat sent to MD   NUTRITION DIAGNOSIS:   Increased nutrient needs related to catabolic illness as evidenced by estimated needs.    GOAL:   Patient will meet greater than or equal to 90% of their needs    MONITOR:   PO intake, Supplement acceptance, Weight trends, Labs  REASON FOR ASSESSMENT:   LOS    ASSESSMENT:   Pt with a PMH significant for anaplastic T-cell lymphoma s/p stem cell transplant and relapse in 11/2019, T2DM, and HLD admitted with FUO.  5/12 bronchoscopy and biopsy 5/14 all infection work-up negative, still persistent fever   Last chemo 04/05/20.   Fever noted to likely be from lymphoma. Oncology is on board.   Pt unavailable at time of RD visit.   Per wt readings, pt with a potential 5.4% wt loss x 1 month, which is significant for time frame. Malnutrition is suspected; however, cannot diagnose at this time without detailed diet history and/or nutrition-focused physical exam.  PO Intake: 0-100% x last 8 recorded meals (62.5% average meal intake)  Labs: CBGs 75-91 Medications reviewed and include: Novolog   NUTRITION - FOCUSED PHYSICAL EXAM:  Unable to perform at this time, will attempt at follow-up.  Diet Order:   Diet Order            Diet heart healthy/carb modified Room service appropriate? Yes; Fluid consistency: Thin  Diet effective now              EDUCATION NEEDS:   No education needs have been identified at this time  Skin:  Skin Assessment: Reviewed RN Assessment  Last BM:  5/17 type 4  Height:   Ht Readings from Last 1 Encounters:  04/22/20 5\' 8"  (1.727 m)    Weight:   Wt Readings from Last 4 Encounters:  04/30/20 96 kg  04/05/20  101.7 kg  03/16/20 101.2 kg  03/05/20 100.7 kg    BMI:  Body mass index is 32.18 kg/m.  Estimated Nutritional Needs:   Kcal:  2100-2300  Protein:  105-120 grams  Fluid:  >2L/d    Larkin Ina, MS, RD, LDN RD pager number and weekend/on-call pager number located in [place].

## 2020-05-02 DIAGNOSIS — E1165 Type 2 diabetes mellitus with hyperglycemia: Secondary | ICD-10-CM

## 2020-05-02 LAB — CBC WITH DIFFERENTIAL/PLATELET
Abs Immature Granulocytes: 0.08 10*3/uL — ABNORMAL HIGH (ref 0.00–0.07)
Basophils Absolute: 0 10*3/uL (ref 0.0–0.1)
Basophils Relative: 0 %
Eosinophils Absolute: 0 10*3/uL (ref 0.0–0.5)
Eosinophils Relative: 0 %
HCT: 27.2 % — ABNORMAL LOW (ref 36.0–46.0)
Hemoglobin: 8 g/dL — ABNORMAL LOW (ref 12.0–15.0)
Immature Granulocytes: 1 %
Lymphocytes Relative: 19 %
Lymphs Abs: 1.4 10*3/uL (ref 0.7–4.0)
MCH: 25.4 pg — ABNORMAL LOW (ref 26.0–34.0)
MCHC: 29.4 g/dL — ABNORMAL LOW (ref 30.0–36.0)
MCV: 86.3 fL (ref 80.0–100.0)
Monocytes Absolute: 1 10*3/uL (ref 0.1–1.0)
Monocytes Relative: 13 %
Neutro Abs: 5.3 10*3/uL (ref 1.7–7.7)
Neutrophils Relative %: 67 %
Platelets: 106 10*3/uL — ABNORMAL LOW (ref 150–400)
RBC: 3.15 MIL/uL — ABNORMAL LOW (ref 3.87–5.11)
RDW: 17.2 % — ABNORMAL HIGH (ref 11.5–15.5)
WBC: 7.8 10*3/uL (ref 4.0–10.5)
nRBC: 0 % (ref 0.0–0.2)

## 2020-05-02 LAB — GLUCOSE, CAPILLARY
Glucose-Capillary: 180 mg/dL — ABNORMAL HIGH (ref 70–99)
Glucose-Capillary: 355 mg/dL — ABNORMAL HIGH (ref 70–99)
Glucose-Capillary: 285 mg/dL — ABNORMAL HIGH (ref 70–99)
Glucose-Capillary: 288 mg/dL — ABNORMAL HIGH (ref 70–99)

## 2020-05-02 MED ORDER — SODIUM CHLORIDE 0.9 % IV SOLN
INTRAVENOUS | Status: DC | PRN
  Administered 2020-05-02: 250 mL via INTRAVENOUS

## 2020-05-02 MED ORDER — PREDNISONE 50 MG PO TABS
100.0000 mg | ORAL_TABLET | Freq: Every day | ORAL | Status: DC
  Administered 2020-05-03 – 2020-05-04 (×2): 100 mg via ORAL
  Filled 2020-05-02 (×2): qty 2

## 2020-05-02 NOTE — Progress Notes (Signed)
Her case was discussed with Dr. Cassell Clement at [facility] where she has been treated for stem cell transplant previously.  She is recommended a 5-day course of prednisone at a higher dose of 100 mg in attempt to control her symptoms prior to additional salvage therapy.  We will arrange follow-up upon her discharge at [facility] if her symptoms improve on prednisone.  If her symptoms do not improve, and she might need inpatient salvage chemotherapy.

## 2020-05-02 NOTE — Progress Notes (Addendum)
PROGRESS NOTE    Megan Mcknight  ZOX:[id number] DOB: 28-Nov-1961 DOA: 04/21/2020 PCP: Rosita Fire, MD    Brief Narrative:  59 year old female with history of anaplastic T-cell lymphoma diagnosed in 2014, status post stem cell transplant in 2015, relapse on 11/2019 and currently on Adcetris followed by Dr. Alen Blew, type 2 diabetes on insulin, hyperlipidemia presented to the emergency room with about 2-week history of right lower back pain.  She was found to have temperature of 102.9.  Sepsis work-up initiated.  CT chest showed new innumerable pulmonary nodules.  For bronchoscopy and further diagnostic, she was transferred to [facility] from [facility]. 5/12: Bronchoscopy and biopsy: Immunohistochemistry for ALK protein and CD30 highlights large atypical  cells consistent with involvement by the patient's known anaplastic  large cell lymphoma. 5/14-5/18: All infection work-up negative, still persistent fever. 5/19: Afebrile today.   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  However, all infectious work-up has been negative.  Fever probably due to T-cell lymphoma.  Treated with antibiotics in the setting of immunocompromised state. Patient was on vancomycin, cefepime and azithromycin.  Discontinued. Bronc biopsy and lavage, cultures negative so far.   Bronc biopsy report: Immunohistochemistry for ALK protein and CD30 highlights large atypical  cells  consistent with involvement by the patient's known anaplastic large cell lymphoma. Hepatitis negative.  Cryptococcal antigen negative. -Seen by oncology.  Appreciate help.  Per oncology patient to be on 5-day course of prednisone 100 mg/day to control her symptoms prior to additional salvage therapy.  If symptoms do not improve on prednisone patient might possibly need inpatient salvage chemotherapy.  Type 2 diabetes with hyperglycemia.  On insulin sliding scale.  Since she is started on high-dose prednisone expect blood glucose to be high.  Continue to monitor closely and adjust medications as needed.  Lower back pain: Negative CT scan.  Improving.  Urine culture with multiple species.  Denies having any urinary symptoms at this time.  Anaplastic T-cell lymphoma on chemotherapy: Last chemo 4/22.  Followed by her oncologist.  Her fever is likely from her lymphoma.  Oncology on board.  Hypertension: Stable.  On metoprolol and Norvasc.  Sinus tachycardia: Patient developed burst of sinus tachycardia with underlying sinus arrhythmia.  Probably due to acute stress, fever.  Symptomatic treatment with metoprolol.  Electrolytes were replaced.  Thrombocytopenia: Monitor platelet counts.  A.m. labs ordered.  DVT prophylaxis: Lovenox subcu Code Status: Full code Family Communication: None.  Patient talking to her sons. Disposition Plan: Status is: Inpatient  Remains inpatient appropriate because:Inpatient level of care appropriate due to severity of illness   Dispo: The patient is from: Home              Anticipated d/c is to: Home              Anticipated d/c date is: 2 days              Patient  currently is not medically stable to d/c. Consultants:   PCCM  Oncology  Infectious disease  Procedures:   Bronchoscopy and biopsy  Antimicrobials:  Antibiotics Given (last 72 hours)    None         Subjective: Seen and examined.  No overnight events. Says that she feels tired on  minimal mobility.  Also complaining of shortness of breath, nonproductive cough.  On oxygen by nasal cannula.  Denies having any chest pain.  Objective: Vitals:   05/01/20 2026 05/01/20 2331 05/02/20 0353 05/02/20 0758  BP: 134/84 104/75 113/78 126/84  Pulse: (!) 112 82 77 79  Resp: 20 (!) 22 (!) 22 18  Temp: 98.6 F (37 C) 98.4 F (36.9 C) 97.7 F (36.5 C) 97.6 F (36.4 C)  TempSrc: Oral Oral Oral Oral  SpO2: 94% 96% 100% 99%  Weight:      Height:        Intake/Output Summary (Last 24 hours) at 05/02/2020 1513 Last data filed at 05/02/2020 1150 Gross per 24 hour  Intake 740 ml  Output 1200 ml  Net -460 ml   Filed Weights   04/26/20 2046 04/29/20 2111 04/30/20 2100  Weight: 107.7 kg 94 kg 96 kg    Examination:  General exam: Appears calm and comfortable, chronically ill-appearing.  Not in any distress. Respiratory system: Clear to auscultation. Respiratory effort normal.  No rhonchi or wheezing. Port-A-Cath on the left chest. Cardiovascular system: S1 & S2, tachycardic.  No murmur Gastrointestinal system: Abdomen is nondistended, soft and nontender. No organomegaly. Normal bowel sounds heard. Central nervous system: Alert and oriented. No focal neurological deficits. Extremities: Symmetric 5 x 5 power. Skin: No rashes, lesions or ulcers     Data Reviewed: I have personally reviewed following labs and imaging studies  CBC: Recent Labs  Lab 04/28/20 0310 04/29/20 0413 04/30/20 0412 05/01/20 0426 05/02/20 0400  WBC 10.6* 11.2* 10.9* 8.6 7.8  NEUTROABS 6.9 7.1 6.9 5.3 5.3  HGB 8.4* 8.6* 8.2* 7.9* 8.0*  HCT 26.1* 27.3* 26.3* 25.8* 27.2*  MCV 82.3 83.5 84.0 84.3 86.3  PLT 189 188 155 132* 638*   Basic Metabolic Panel: Recent Labs  Lab 04/27/20 0500 04/28/20 0310 04/29/20 0413 04/30/20 0412 05/01/20 0426  NA  --   --  139 137 138  K  --   --  3.5 4.0 4.3  CL  --   --  101 101 103  CO2  --   --  29 28 26   GLUCOSE  --   --  110* 117* 105*  BUN  --   --   10 11 12   CREATININE 0.65 0.66 0.68 0.70 0.65  CALCIUM  --   --  9.0 8.8* 9.0  MG  --  1.6*  --   --   --   PHOS  --  3.7  --   --   --    GFR: Estimated Creatinine Clearance: 92.8 mL/min (by C-G formula based on SCr of 0.65 mg/dL). Liver Function Tests: Recent Labs  Lab 04/29/20 0413 04/30/20 0412 05/01/20 0426  AST 53* 62* 62*  ALT 77* 86* 88*  ALKPHOS 65 70 68  BILITOT 0.6 0.6 0.5  PROT 6.5 6.4* 6.5  ALBUMIN 2.0* 1.9* 1.9*   No results for input(s): LIPASE, AMYLASE in the last 168 hours. No results for input(s): AMMONIA in the last 168 hours. Coagulation Profile: No results for input(s): INR, PROTIME in the last 168 hours. Cardiac Enzymes: No results for input(s):  CKTOTAL, CKMB, CKMBINDEX, TROPONINI in the last 168 hours. BNP (last 3 results) No results for input(s): PROBNP in the last 8760 hours. HbA1C: No results for input(s): HGBA1C in the last 72 hours. CBG: Recent Labs  Lab 05/01/20 1129 05/01/20 1641 05/01/20 2206 05/02/20 0641 05/02/20 1112  GLUCAP 117* 124* 229* 180* 355*   Lipid Profile: No results for input(s): CHOL, HDL, LDLCALC, TRIG, CHOLHDL, LDLDIRECT in the last 72 hours. Thyroid Function Tests: No results for input(s): TSH, T4TOTAL, FREET4, T3FREE, THYROIDAB in the last 72 hours. Anemia Panel: No results for input(s): VITAMINB12, FOLATE, FERRITIN, TIBC, IRON, RETICCTPCT in the last 72 hours. Sepsis Labs: No results for input(s): PROCALCITON, LATICACIDVEN in the last 168 hours.  Recent Results (from the past 240 hour(s))  MRSA PCR Screening     Status: None   Collection Time: 04/24/20 12:19 AM  Result Value Ref Range Status   MRSA by PCR NEGATIVE NEGATIVE Final    Comment:        The GeneXpert MRSA Assay (FDA approved for NASAL specimens only), is one component of a comprehensive MRSA colonization surveillance program. It is not intended to diagnose MRSA infection nor to guide or monitor treatment for MRSA infections. Performed at  [facility], [place] [address], [place], [place] [address]   Fungus Culture With Stain     Status: None (Preliminary result)   Collection Time: 04/25/20  2:46 PM   Specimen: Bronchial Washing, Left; Lung  Result Value Ref Range Status   Fungus Stain Final report  Final    Comment: (NOTE) Performed At: [facility] [place], [place] [id number] Rush Farmer MD UD:[id number]    Fungus (Mycology) Culture PENDING  Incomplete   Fungal Source BRONCHIAL ALVEOLAR LAVAGE  Final    Comment: LEFT  Culture, respiratory     Status: None   Collection Time: 04/25/20  2:46 PM   Specimen: Bronchial Washing, Left; Lung  Result Value Ref Range Status   Specimen Description BRONCHIAL ALVEOLAR LAVAGE LEFT  Final   Special Requests NONE  Final   Gram Stain   Final    FEW WBC PRESENT, PREDOMINANTLY PMN NO ORGANISMS SEEN    Culture   Final    NO GROWTH 2 DAYS Performed at [facility], [place] [address] [place], [place] [address]    Report Status 04/27/2020 FINAL  Final  Acid Fast Smear (AFB)     Status: None   Collection Time: 04/25/20  2:46 PM   Specimen: Bronchial Washing, Left; Lung  Result Value Ref Range Status   AFB Specimen Processing Concentration  Final   Acid Fast Smear Negative  Final    Comment: (NOTE) Performed At: [facility] [place], [place] [id number] Rush Farmer MD MV:[id number]    Source (AFB) BRONCHIAL ALVEOLAR LAVAGE  Final    Comment: LEFT  Fungus Culture Result     Status: None   Collection Time: 04/25/20  2:46 PM  Result Value Ref Range Status   Result 1 Comment  Final    Comment: (NOTE) KOH/Calcofluor preparation:  no fungus observed. Performed At: [facility] [place], [place] [id number] Rush Farmer MD ML:[id number]          Radiology Studies: No results found.      Scheduled Meds: . Chlorhexidine Gluconate Cloth  6 each Topical Daily  . enoxaparin (LOVENOX)  injection  40 mg Subcutaneous QHS  . feeding supplement  1 Container Oral TID  BM  . feeding supplement (PRO-STAT SUGAR FREE 64)  30 mL Oral TID  . insulin aspart  0-5 Units Subcutaneous QHS  . insulin aspart  0-9 Units Subcutaneous TID WC  . metoprolol tartrate  25 mg Oral BID  . montelukast  10 mg Oral QHS  . [START ON 05/03/2020] predniSONE  100 mg Oral Q breakfast  . rosuvastatin  10 mg Oral Daily  . sodium chloride flush  10-40 mL Intracatheter Q12H   Continuous Infusions:    LOS: 11 days    Time spent: 25 minutes    Yaakov Guthrie, MD [facility] Pager on [employer]

## 2020-05-02 NOTE — Progress Notes (Signed)
IP PROGRESS NOTE  Subjective:   No major complaints noted overnight.  She was started on prednisone tolerated first dose without any complications.  Feels better with no fevers reported time.  She does report dyspnea on exertion but is able to ambulate short distances without any recent falls or syncope.  He denies any cough or wheezing.    Objective:  Vital signs in last 24 hours: Temp:  [97.6 F (36.4 C)-98.6 F (37 C)] 97.6 F (36.4 C) (05/19 0758) Pulse Rate:  [77-112] 79 (05/19 0758) Resp:  [18-22] 18 (05/19 0758) BP: (104-134)/(75-84) 126/84 (05/19 0758) SpO2:  [94 %-100 %] 99 % (05/19 0758) Weight change:  Last BM Date: 04/30/20  Intake/Output from previous day: 05/18 0701 - 05/19 0700 In: 800 [P.O.:800] Out: 200 [Urine:200]  General appearance: Comfortable appearing without any discomfort Head: Normocephalic without any trauma Oropharynx: Mucous membranes are moist and pink without any thrush or ulcers. Eyes: Pupils are equal and round reactive to light. Lymph nodes:  Inguinal lymph node palpated on the left side. Heart:regular rate and rhythm.  S1 and S2 without leg edema. Lung: Clear without any rhonchi or wheezes.  No dullness to percussion. Abdomin: Soft, nontender, nondistended with good bowel sounds.  No hepatosplenomegaly. Musculoskeletal: No joint deformity or effusion.  Full range of motion noted. Neurological: No deficits noted on motor, sensory and deep tendon reflex exam. Skin: No petechial rash or dryness.  Appeared moist.     Portacath in place without any erythema or induration.  Lab Results: Recent Labs    05/01/20 0426 05/02/20 0400  WBC 8.6 7.8  HGB 7.9* 8.0*  HCT 25.8* 27.2*  PLT 132* 106*    BMET Recent Labs    04/30/20 0412 05/01/20 0426  NA 137 138  K 4.0 4.3  CL 101 103  CO2 28 26  GLUCOSE 117* 105*  BUN 11 12  CREATININE 0.70 0.65  CALCIUM 8.8* 9.0     Medications: I have reviewed the patient's current  medications.  Assessment/Plan:  59 year old woman with:  1.  Relapsed CD30 positive anaplastic T-cell lymphoma with initial diagnosis in 2014.  He presented with bilateral pulmonary involvement and fevers with infectious work-up is unrevealing.  Biopsy confirmed the presence of relapsed lymphoma.  The natural course of this disease was reviewed today with the patient and her case was discussed with Dr.Vaidya at [facility].  For the time being it was recommended that a short trial of steroids to relieve her symptoms of fevers and decrease her bulk of disease until salvage therapy is initiated.  If she is unresponsive to steroids, chemotherapy in the form of ICE regimen will be given at that time.  We will set up a follow-up at [facility] for evaluation regarding potential experimental treatment options.  2.  Bilateral pulmonary nodules: Biopsy-proven to be relapsed lymphoma without any infectious etiology.  3.  Fevers and dyspnea: She is afebrile on steroids with the respiratory status improving.  4.  Disposition: It would be reasonable to discharge on prednisone 100 mg daily for total of 6 doses.  25  minutes were spent on this encounter.  More than 50% of the time was dedicated to reviewing pathology results, treatment options and future plan of care.     LOS: 11 days   Zola Button 05/02/2020, 3:36 PM

## 2020-05-02 NOTE — Plan of Care (Signed)
  Problem: Activity: Goal: Risk for activity intolerance will decrease Outcome: Progressing   

## 2020-05-03 DIAGNOSIS — M545 Low back pain: Secondary | ICD-10-CM

## 2020-05-03 LAB — CBC WITH DIFFERENTIAL/PLATELET
Abs Immature Granulocytes: 0.27 10*3/uL — ABNORMAL HIGH (ref 0.00–0.07)
Basophils Absolute: 0 10*3/uL (ref 0.0–0.1)
Basophils Relative: 0 %
Eosinophils Absolute: 0 10*3/uL (ref 0.0–0.5)
Eosinophils Relative: 0 %
HCT: 27.1 % — ABNORMAL LOW (ref 36.0–46.0)
Hemoglobin: 8.6 g/dL — ABNORMAL LOW (ref 12.0–15.0)
Immature Granulocytes: 2 %
Lymphocytes Relative: 17 %
Lymphs Abs: 2.4 10*3/uL (ref 0.7–4.0)
MCH: 26.8 pg (ref 26.0–34.0)
MCHC: 31.7 g/dL (ref 30.0–36.0)
MCV: 84.4 fL (ref 80.0–100.0)
Monocytes Absolute: 1.6 10*3/uL — ABNORMAL HIGH (ref 0.1–1.0)
Monocytes Relative: 12 %
Neutro Abs: 9.8 10*3/uL — ABNORMAL HIGH (ref 1.7–7.7)
Neutrophils Relative %: 69 %
Platelets: 180 10*3/uL (ref 150–400)
RBC: 3.21 MIL/uL — ABNORMAL LOW (ref 3.87–5.11)
RDW: 16.9 % — ABNORMAL HIGH (ref 11.5–15.5)
WBC: 14.1 10*3/uL — ABNORMAL HIGH (ref 4.0–10.5)
nRBC: 0 % (ref 0.0–0.2)

## 2020-05-03 LAB — GLUCOSE, CAPILLARY
Glucose-Capillary: 115 mg/dL — ABNORMAL HIGH (ref 70–99)
Glucose-Capillary: 103 mg/dL — ABNORMAL HIGH (ref 70–99)
Glucose-Capillary: 351 mg/dL — ABNORMAL HIGH (ref 70–99)
Glucose-Capillary: 308 mg/dL — ABNORMAL HIGH (ref 70–99)

## 2020-05-03 NOTE — Progress Notes (Signed)
PROGRESS NOTE    Megan Mcknight  SMO:[id number] DOB: [birth year] DOA: 04/21/2020 PCP: Rosita Fire, MD    Brief Narrative:  59 year old female with history of anaplastic T-cell lymphoma diagnosed in 2014, status post stem cell transplant in 2015, relapse on 11/2019 and currently on Adcetris followed by Dr. Alen Blew, type 2 diabetes on insulin, hyperlipidemia presented to the emergency room with about 2-week history of right lower back pain.  She was found to have temperature of 102.9.  Sepsis work-up initiated.  CT chest showed new innumerable pulmonary nodules.  For bronchoscopy and further diagnostic, she was transferred to [facility]. 5/12: Bronchoscopy and biopsy: Immunohistochemistry for ALK protein and CD30 highlights large atypical  cells consistent with involvement by the patient's known anaplastic  large cell lymphoma. 5/14-5/18: All infection work-up negative, still persistent fever. 5/20: Afebrile today.   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  However, all infectious work-up has been negative.  Fever probably due to T-cell lymphoma.  Treated with antibiotics in the setting of immunocompromised state. Patient was on vancomycin, cefepime and azithromycin.  Discontinued. Bronc biopsy and lavage, cultures negative so far.   Bronc biopsy report: Immunohistochemistry for ALK protein and CD30 highlights large atypical  cells  consistent with involvement by the patient's known anaplastic large cell lymphoma. Hepatitis negative.  Cryptococcal antigen negative. -Seen by oncology.  Appreciate help.  Per oncology patient to be on 5-day course of prednisone 100 mg/day to control her symptoms prior to additional salvage therapy.  If symptoms do not improve on prednisone patient might possibly need inpatient salvage chemotherapy.  Type 2 diabetes with hyperglycemia.  On insulin sliding scale.  Since she is started on high-dose prednisone expect blood glucose to be high.  Continue to monitor closely and adjust medications as needed.  Lower back pain: Negative CT scan. Urine culture with multiple species.  Denies having any urinary symptoms at this time.  However, this morning she is saying that she is having back pain.  Patient says that she has on and off back pain which lasts for a few minutes and then eases up.  Denies having any weakness, bowel or bladder incontinence at this time.  Requested PT and OT evaluation.  Anaplastic T-cell lymphoma on chemotherapy: Last chemo 4/22.  Followed by her oncologist.  Her fever is likely from her lymphoma.  Oncology on board.  Hypertension: Stable.  On metoprolol and Norvasc.  Sinus tachycardia: Patient developed burst of sinus tachycardia with underlying sinus arrhythmia.  Probably due to acute stress, fever.  Symptomatic treatment with metoprolol.  Electrolytes were replaced.  Thrombocytopenia: Platelet count improved.  DVT prophylaxis: Lovenox subcu Code Status: Full code Family Communication: None.  Patient talking to her sons. Disposition Plan: Status is: Inpatient  Remains inpatient appropriate because:Inpatient level of care appropriate due to severity of illness   Dispo: The patient is from: Home  Anticipated d/c is to: Home              Anticipated d/c date is: 1 day              Patient currently is not medically stable to d/c. Consultants:    PCCM  Oncology  Infectious disease  Procedures:   Bronchoscopy and biopsy  Antimicrobials:  Antibiotics Given (last 72 hours)    None         Subjective: This morning she is complaining of back pain.  Denies having any dysuria, bowel or bladder incontinence or any new weakness.  Afebrile.  Remains on oxygen by nasal cannula.  Objective: Vitals:   05/03/20 0016 05/03/20 0501 05/03/20 1115 05/03/20 1233  BP: 128/81 140/75 123/82   Pulse: 93 (!) 115 97   Resp: 18 18 18    Temp: 98.4 F (36.9 C) 100.2 F (37.9 C) 98.3 F (36.8 C)   TempSrc:  Oral    SpO2: 100% 97% 93% 94%  Weight:      Height:        Intake/Output Summary (Last 24 hours) at 05/03/2020 1604 Last data filed at 05/03/2020 1400 Gross per 24 hour  Intake 700.46 ml  Output 1700 ml  Net -999.54 ml   Filed Weights   04/26/20 2046 04/29/20 2111 04/30/20 2100  Weight: 107.7 kg 94 kg 96 kg    Examination:  General exam: Appears calm and comfortable.  Not in any distress. Respiratory system: Clear to auscultation. Respiratory effort normal.  No rhonchi or wheezing. Port-A-Cath on the left chest. Cardiovascular system: S1 & S2, tachycardic.  No murmur Gastrointestinal system: Abdomen is nondistended, soft and nontender. No organomegaly. Normal bowel sounds heard. Central nervous system: Alert and oriented. No focal neurological deficits. Extremities: Symmetric 5 x 5 power. Skin: No rashes, lesions or ulcers     Data Reviewed: I have personally reviewed following labs and imaging studies  CBC: Recent Labs  Lab 04/29/20 0413 04/30/20 0412 05/01/20 0426 05/02/20 0400 05/03/20 0417  WBC 11.2* 10.9* 8.6 7.8 14.1*  NEUTROABS 7.1 6.9 5.3 5.3 9.8*  HGB 8.6* 8.2* 7.9* 8.0* 8.6*  HCT 27.3* 26.3* 25.8* 27.2* 27.1*  MCV 83.5 84.0 84.3 86.3 84.4  PLT 188 155 132* 106* 254   Basic Metabolic Panel: Recent Labs  Lab 04/27/20 0500 04/28/20 0310 04/29/20 0413 04/30/20 0412 05/01/20 0426  NA  --    --  139 137 138  K  --   --  3.5 4.0 4.3  CL  --   --  101 101 103  CO2  --   --  29 28 26   GLUCOSE  --   --  110* 117* 105*  BUN  --   --  10 11 12   CREATININE 0.65 0.66 0.68 0.70 0.65  CALCIUM  --   --  9.0 8.8* 9.0  MG  --  1.6*  --   --   --   PHOS  --  3.7  --   --   --    GFR: Estimated Creatinine Clearance: 92.8 mL/min (by C-G formula based on SCr of 0.65 mg/dL). Liver Function Tests: Recent Labs  Lab 04/29/20 0413 04/30/20 0412 05/01/20 0426  AST 53* 62* 62*  ALT 77* 86* 88*  ALKPHOS 65 70 68  BILITOT 0.6 0.6 0.5  PROT 6.5 6.4* 6.5  ALBUMIN 2.0* 1.9* 1.9*   No results for input(s): LIPASE, AMYLASE in the last 168 hours. No results for input(s):  AMMONIA in the last 168 hours. Coagulation Profile: No results for input(s): INR, PROTIME in the last 168 hours. Cardiac Enzymes: No results for input(s): CKTOTAL, CKMB, CKMBINDEX, TROPONINI in the last 168 hours. BNP (last 3 results) No results for input(s): PROBNP in the last 8760 hours. HbA1C: No results for input(s): HGBA1C in the last 72 hours. CBG: Recent Labs  Lab 05/02/20 1112 05/02/20 1624 05/02/20 2045 05/03/20 0642 05/03/20 1113  GLUCAP 355* 285* 288* 115* 103*   Lipid Profile: No results for input(s): CHOL, HDL, LDLCALC, TRIG, CHOLHDL, LDLDIRECT in the last 72 hours. Thyroid Function Tests: No results for input(s): TSH, T4TOTAL, FREET4, T3FREE, THYROIDAB in the last 72 hours. Anemia Panel: No results for input(s): VITAMINB12, FOLATE, FERRITIN, TIBC, IRON, RETICCTPCT in the last 72 hours. Sepsis Labs: No results for input(s): PROCALCITON, LATICACIDVEN in the last 168 hours.  Recent Results (from the past 240 hour(s))  MRSA PCR Screening     Status: None   Collection Time: 04/24/20 12:19 AM  Result Value Ref Range Status   MRSA by PCR NEGATIVE NEGATIVE Final    Comment:        The GeneXpert MRSA Assay (FDA approved for NASAL specimens only), is one component of a comprehensive MRSA  colonization surveillance program. It is not intended to diagnose MRSA infection nor to guide or monitor treatment for MRSA infections. Performed at [facility], [place] [address] [place], [place] [address]   Fungus Culture With Stain     Status: None (Preliminary result)   Collection Time: 04/25/20  2:46 PM   Specimen: Bronchial Washing, Left; Lung  Result Value Ref Range Status   Fungus Stain Final report  Final    Comment: (NOTE) Performed At: [facility] [place], [place] [id number] Rush Farmer MD OE:[id number]    Fungus (Mycology) Culture PENDING  Incomplete   Fungal Source BRONCHIAL ALVEOLAR LAVAGE  Final    Comment: LEFT  Culture, respiratory     Status: None   Collection Time: 04/25/20  2:46 PM   Specimen: Bronchial Washing, Left; Lung  Result Value Ref Range Status   Specimen Description BRONCHIAL ALVEOLAR LAVAGE LEFT  Final   Special Requests NONE  Final   Gram Stain   Final    FEW WBC PRESENT, PREDOMINANTLY PMN NO ORGANISMS SEEN    Culture   Final    NO GROWTH 2 DAYS Performed at [facility], [place] [address] [place], [place] [address]    Report Status 04/27/2020 FINAL  Final  Acid Fast Smear (AFB)     Status: None   Collection Time: 04/25/20  2:46 PM   Specimen: Bronchial Washing, Left; Lung  Result Value Ref Range Status   AFB Specimen Processing Concentration  Final   Acid Fast Smear Negative  Final    Comment: (NOTE) Performed At: [facility] [place], [place] [id number] Rush Farmer MD TO:[id number]    Source (AFB) BRONCHIAL ALVEOLAR LAVAGE  Final    Comment: LEFT  Fungus Culture Result     Status: None   Collection Time: 04/25/20  2:46 PM  Result Value Ref Range Status   Result 1 Comment  Final    Comment: (NOTE) KOH/Calcofluor preparation:  no fungus observed. Performed At: [facility] [place], [place] [id number] Rush Farmer MD ZJ:[id number]           Radiology Studies: No results found.  Scheduled Meds: . Chlorhexidine Gluconate Cloth  6 each  Topical Daily  . enoxaparin (LOVENOX) injection  40 mg Subcutaneous QHS  . feeding supplement  1 Container Oral TID BM  . feeding supplement (PRO-STAT SUGAR FREE 64)  30 mL Oral TID  . insulin aspart  0-5 Units Subcutaneous QHS  . insulin aspart  0-9 Units Subcutaneous TID WC  . metoprolol tartrate  25 mg Oral BID  . montelukast  10 mg Oral QHS  . predniSONE  100 mg Oral Q breakfast  . rosuvastatin  10 mg Oral Daily  . sodium chloride flush  10-40 mL Intracatheter Q12H   Continuous Infusions: . sodium chloride 250 mL (05/02/20 2326)     LOS: 12 days    Time spent: 25 minutes    Yaakov Guthrie, MD [facility] Pager on [employer]

## 2020-05-03 NOTE — Evaluation (Signed)
Physical Therapy Evaluation Patient Details Name: Megan Mcknight MRN: [id number] DOB: [birth year] Today's Date: 05/03/2020   History of Present Illness  59 yo admitted 5/8 with back pain radiating to RLE (xray negative for fx), pt with fever and bil pulmonary nodules. Biopsy revealed relapsed lymphoma. PMHx: HTN, HLD, DM, asthma, Afib, non Hodgkin lymphoma  Clinical Impression  Pt presents with an overall decrease in functional mobility, cardiopulmonary endurance and balance secondary to above. PTA, pt lives in two story home alone, with 3 son's who live locally if needed.Today, pt able to complete functional mobility mod independent and amb min guard with RW and o2 sats 88-93% RA. Discussed discharge recommendations for home with home health physical therapy, which patient was agreeable.  Pt would benefit from continued acute PT services to maximize functional mobility and independence prior to d/c to next venue of care.    Spo2 amb >90% RA Spo2 with stair negotiation 88% RA with recovery to >90% RA with amb  Spo2 after amb 92-93% RA   Follow Up Recommendations Home health PT    Equipment Recommendations  Rolling walker with 5" wheels    Recommendations for Other Services OT consult     Precautions / Restrictions Precautions Precautions: None      Mobility  Bed Mobility Overal bed mobility: Modified Independent             General bed mobility comments: with rail and HOB 60 degrees  Transfers Overall transfer level: Modified independent                  Ambulation/Gait Ambulation/Gait assistance: Min guard Gait Distance (Feet): 150 Feet Assistive device: None;Rolling walker (2 wheeled) Gait Pattern/deviations: Step-through pattern;Decreased stride length;Trunk flexed   Gait velocity interpretation: 1.31 - 2.62 ft/sec, indicative of limited community ambulator General Gait Details: cues for posture and proximity to [employer] Stairs: Yes Stairs assistance:  Min guard Stair Management: Step to pattern;Forwards;One rail Left Number of Stairs: 3    Wheelchair Mobility    Modified Rankin (Stroke Patients Only)       Balance Overall balance assessment: Needs assistance   Sitting balance-Leahy Scale: Good       Standing balance-Leahy Scale: Fair Standing balance comment: able to stand without AD but feeling unsteady and RW for gait                             Pertinent Vitals/Pain Pain Assessment: No/denies pain    Home Living Family/patient expects to be discharged to:: Private residence Living Arrangements: Alone Available Help at Discharge: Family;Available 24 hours/day Type of Home: House Home Access: Stairs to enter Entrance Stairs-Rails: Left Entrance Stairs-Number of Steps: 3 Home Layout: Two level;Able to live on main level with bedroom/bathroom Home Equipment: None      Prior Function Level of Independence: Independent               Hand Dominance        Extremity/Trunk Assessment                Communication   Communication: No difficulties  Cognition Arousal/Alertness: Awake/alert Behavior During Therapy: WFL for tasks assessed/performed Overall Cognitive Status: Within Functional Limits for tasks assessed  General Comments      Exercises     Assessment/Plan    PT Assessment Patient needs continued PT services  PT Problem List Decreased mobility;Decreased activity tolerance;Decreased balance;Decreased knowledge of use of DME       PT Treatment Interventions DME instruction;Therapeutic exercise;Gait training;Stair training;Functional mobility training;Therapeutic activities;Patient/family education;Balance training    PT Goals (Current goals can be found in the Care Plan section)  Acute Rehab PT Goals Patient Stated Goal: return home and take care of myself PT Goal Formulation: With patient Time For Goal  Achievement: 05/17/20 Potential to Achieve Goals: Good    Frequency Min 3X/week   Barriers to discharge Decreased caregiver support      Co-evaluation               AM-PAC PT "6 Clicks" Mobility  Outcome Measure Help needed turning from your back to your side while in a flat bed without using bedrails?: A Little Help needed moving from lying on your back to sitting on the side of a flat bed without using bedrails?: A Little Help needed moving to and from a bed to a chair (including a wheelchair)?: A Little Help needed standing up from a chair using your arms (e.g., wheelchair or bedside chair)?: A Little Help needed to walk in hospital room?: A Little Help needed climbing 3-5 steps with a railing? : A Little 6 Click Score: 18    End of Session Equipment Utilized During Treatment: Gait belt Activity Tolerance: Patient tolerated treatment well Patient left: in chair;with call bell/phone within reach Nurse Communication: Mobility status PT Visit Diagnosis: Other abnormalities of gait and mobility (R26.89);Difficulty in walking, not elsewhere classified (R26.2)    Time: 1117-3567 PT Time Calculation (min) (ACUTE ONLY): 21 min   Charges:   PT Evaluation $PT Eval Moderate Complexity: 1 Mod          Denica Web SPT 05/03/2020   Rolland Porter 05/03/2020, 2:01 PM

## 2020-05-04 DIAGNOSIS — E1169 Type 2 diabetes mellitus with other specified complication: Secondary | ICD-10-CM

## 2020-05-04 DIAGNOSIS — Z794 Long term (current) use of insulin: Secondary | ICD-10-CM

## 2020-05-04 LAB — CBC WITH DIFFERENTIAL/PLATELET
Abs Immature Granulocytes: 0.12 10*3/uL — ABNORMAL HIGH (ref 0.00–0.07)
Basophils Absolute: 0 10*3/uL (ref 0.0–0.1)
Basophils Relative: 0 %
Eosinophils Absolute: 0 10*3/uL (ref 0.0–0.5)
Eosinophils Relative: 0 %
HCT: 24.5 % — ABNORMAL LOW (ref 36.0–46.0)
Hemoglobin: 7.7 g/dL — ABNORMAL LOW (ref 12.0–15.0)
Immature Granulocytes: 1 %
Lymphocytes Relative: 21 %
Lymphs Abs: 2.4 10*3/uL (ref 0.7–4.0)
MCH: 26.5 pg (ref 26.0–34.0)
MCHC: 31.4 g/dL (ref 30.0–36.0)
MCV: 84.2 fL (ref 80.0–100.0)
Monocytes Absolute: 1.5 10*3/uL — ABNORMAL HIGH (ref 0.1–1.0)
Monocytes Relative: 13 %
Neutro Abs: 7.4 10*3/uL (ref 1.7–7.7)
Neutrophils Relative %: 65 %
Platelets: 153 10*3/uL (ref 150–400)
RBC: 2.91 MIL/uL — ABNORMAL LOW (ref 3.87–5.11)
RDW: 16.9 % — ABNORMAL HIGH (ref 11.5–15.5)
WBC: 11.5 10*3/uL — ABNORMAL HIGH (ref 4.0–10.5)
nRBC: 0 % (ref 0.0–0.2)

## 2020-05-04 LAB — BASIC METABOLIC PANEL
Anion gap: 11 (ref 5–15)
BUN: 20 mg/dL (ref 6–20)
CO2: 26 mmol/L (ref 22–32)
Calcium: 8.8 mg/dL — ABNORMAL LOW (ref 8.9–10.3)
Chloride: 104 mmol/L (ref 98–111)
Creatinine, Ser: 0.61 mg/dL (ref 0.44–1.00)
GFR calc Af Amer: >60 mL/min (ref >60)
GFR calc non Af Amer: >60 mL/min (ref >60)
Glucose, Bld: 280 mg/dL — ABNORMAL HIGH (ref 70–99)
Potassium: 3.6 mmol/L (ref 3.5–5.1)
Sodium: 141 mmol/L (ref 135–145)

## 2020-05-04 LAB — GLUCOSE, CAPILLARY
Glucose-Capillary: 174 mg/dL — ABNORMAL HIGH (ref 70–99)
Glucose-Capillary: 296 mg/dL — ABNORMAL HIGH (ref 70–99)
Glucose-Capillary: 432 mg/dL — ABNORMAL HIGH (ref 70–99)

## 2020-05-04 LAB — MAGNESIUM: Magnesium: 2 mg/dL (ref 1.7–2.4)

## 2020-05-04 MED ORDER — INSULIN ASPART 100 UNIT/ML ~~LOC~~ SOLN: 0.0000 [IU] | Freq: Three times a day (TID) | SUBCUTANEOUS | 0 refills | Status: DC

## 2020-05-04 MED ORDER — PREDNISONE 50 MG PO TABS: ORAL_TABLET | ORAL | 0 refills | Status: AC

## 2020-05-04 MED ORDER — POTASSIUM CHLORIDE CRYS ER 20 MEQ PO TBCR
40.0000 meq | EXTENDED_RELEASE_TABLET | Freq: Once | ORAL | Status: AC
  Administered 2020-05-04: 40 meq via ORAL
  Filled 2020-05-04: qty 2

## 2020-05-04 MED ORDER — HEPARIN SOD (PORK) LOCK FLUSH 100 UNIT/ML IV SOLN
500.0000 [IU] | INTRAVENOUS | Status: AC | PRN
  Administered 2020-05-04: 500 [IU]
  Filled 2020-05-04: qty 5

## 2020-05-04 NOTE — Progress Notes (Addendum)
Inpatient Diabetes Program Recommendations  AACE/ADA: New Consensus Statement on Inpatient Glycemic Control (2015)  Target Ranges:  Prepandial:   less than 140 mg/dL      Peak postprandial:   less than 180 mg/dL (1-2 hours)      Critically ill patients:  140 - 180 mg/dL   Lab Results  Component Value Date   GLUCAP 174 (H) 05/04/2020   HGBA1C 7.4 (H) 04/22/2020    Review of Glycemic Control Results for Megan Mcknight, Megan Mcknight (MRN [id number]) as of 05/04/2020 09:21  Ref. Range 05/03/2020 11:13 05/03/2020 16:45 05/03/2020 20:47 05/04/2020 06:43  Glucose-Capillary Latest Ref Range: 70 - 99 mg/dL 103 (H) 351 (H) 308 (H) 174 (H)   Diabetes history: Type 2 DM Outpatient Diabetes medications: Lantus 70 units QHS, Metformin 500 mg BID Current orders for Inpatient glycemic control: Novolog 0-9 units TID, Novolog 0-5 units QHS,  Prednisone 100 mg QAM  Inpatient Diabetes Program Recommendations:    In the setting of steroids, consider adding Novolog 4 units TID (assuming patient is consuming >50% of meals).  If appropriate, may want to switch to carb modified diet.   Thanks, Bronson Curb, MSN, RNC-OB Diabetes Coordinator [phone] (8a-5p)

## 2020-05-04 NOTE — Discharge Summary (Signed)
Physician Discharge Summary  Megan Mcknight QMV:[id number] DOB: 1961/10/27 DOA: 04/21/2020  PCP: Rosita Fire, MD  Admit date: 04/21/2020 Discharge date: 05/04/2020  Admitted From: Home Disposition: Home with PT  Recommendations for Outpatient Follow-up:  1. Follow up with PCP in 1-2 weeks 2. Please obtain BMP/CBC in one week   Home Health: PT Equipment/Devices: No  Discharge Condition: Stable CODE STATUS: Full  Siet recommendation: Carb Modified   Brief/Interim Summary: Patient is a 59 year old female with history of anaplastic T-cell lymphoma diagnosed in 2014, status post stem cell transplant in 2015, relapse on 11/2019 and currently on Adcetris followed by Dr. Alen Blew, type 2 diabetes on insulin, hyperlipidemia presented to the emergency room with about 2-week history of right lower back pain.  She was found to have temperature of 102.9. CT chest showed new innumerable pulmonary nodules. For bronchoscopy and further diagnostic, she was transferred to [facility]. 5/12: Bronchoscopy and biopsy: Immunohistochemistry for ALK protein and CD30 highlights large atypical  cells consistent with involvement by the patient's known anaplastic  large cell lymphoma. 5/14-5/18: All infection work-up negative, still persistent fever. She has remained afebrile since 05/03/2020.  Following is the hospital course in the problem list format:  Fever of unknown origin:  She was thought to have sepsis at the time of admission.  However, all infectious work-up has been negative.  Fever probably due to T-cell lymphoma.    She was initially started on IV vancomycin, cefepime and azithromycin which have been discontinued.   Bronc biopsy report: Immunohistochemistry for ALK protein and CD30 highlights large atypical  cells consistent with involvement by the patient's known anaplastic large cell lymphoma. Hepatitis negative.  Cryptococcal antigen negative. -Seen by oncology. Per  oncology patient to be on 5-day course of prednisone 100 mg/day to control her symptoms prior to additional salvage therapy.  She will complete this on 05/07/2020.  She has an appointment at [facility] oncology on 05/09/2020.  Type 2 diabetes with hyperglycemia.  Since she is started on high-dose prednisone expect blood glucose to be high.  Patient wishes to continue her home dose of Lantus and Metformin.  Also provided prescription for mealtime coverage.  She was advised to be on diabetic diet.  She is also advised to continue to monitor blood glucose closely.  Advised to follow-up with her primary care physician regarding her diabetes.  Lower back pain: Negative CT scan. Urine culture with multiple species.  Denies having any urinary symptoms at this time. Patient says that she has on and off back pain which lasts for a few minutes and then eases up.  Denies having any weakness, bowel or bladder incontinence at this time.  Requested PT and OT evaluation.  PT recommended home PT.  No OT follow-up.  Case management helping to set up home PT.  This morning she says she does not have back pain.  Anaplastic T-cell lymphoma on chemotherapy: Last chemo 4/22.  Followed by her oncologist.  Her fever is likely from her lymphoma.  Steroids as mentioned above.  She will follow up with outpatient oncology.  Hypertension: She will continue her home medications.  She will follow-up with her primary care physician to have a blood pressure monitored closely and medications adjusted accordingly.  Sinus tachycardia: Patient developed burst of sinus tachycardia with underlying sinus arrhythmia.  Probably due to acute stress, fever. Electrolytes were replaced.  Improved.  Thrombocytopenia: Platelet count improved.   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  Discharge plan of care discussed with the patient.  Answered questions appropriately to the best of my knowledge.  She is being discharged home in a stable condition.  Allergies as of 05/04/2020      Reactions   Iodine Anaphylaxis   Told to avoid due to shellfish allergy. Told to avoid due to shellfish allergy.   Shellfish-derived Products Anaphylaxis, Swelling   Eyes swelling, scratchy throat, bumps on lips.      Medication List    TAKE these medications   Accu-Chek Guide test strip Generic drug: glucose blood Use as instructed   Accu-Chek Guide w/Device Kit 1 Piece by Does not apply route as directed.   albuterol 108 (90 Base) MCG/ACT inhaler Commonly known as: VENTOLIN HFA Inhale 2 puffs into the lungs every 4 (four) hours as needed for wheezing or shortness of breath.   amLODipine 2.5 MG tablet Commonly known as: NORVASC Take 2.5 mg by mouth daily.   ammonium lactate 12 % cream Commonly known as: AMLACTIN APPLY TO AFFECTED AREA ONCE A DAY FOR 90 DAYS   B-D ULTRAFINE III SHORT PEN 31G X 8 MM Misc Generic drug: Insulin Pen Needle 1 each by Does not apply route as directed.   blood glucose meter kit and supplies Kit 1 each by Does not apply route 4 (four) times daily. Dispense based on patient and insurance preference. Use up to four times daily as directed. (FOR ICD-9 250.00, 250.01).   insulin aspart 100 UNIT/ML injection Commonly known as: novoLOG Inject 0-5 Units into the skin 3 (three) times daily with meals. CBG 70 - 120: 0  units CBG 121 - 150: 1 unit CBG 151 - 200: 2 units CBG 201 - 250: 3 units CBG 251 - 300: 5 units CBG 301 - 350: 7 units CBG 351 - 400: 9 units   Lantus SoloStar 100 UNIT/ML Solostar Pen Generic drug: insulin glargine Inject 70 Units into the skin at bedtime.   latanoprost 0.005 % ophthalmic solution Commonly known as: XALATAN 1 drop at bedtime.   lidocaine-prilocaine cream Commonly known as: EMLA Apply 1 application topically as needed.   losartan 25 MG tablet Commonly known as: COZAAR Take 25 mg by mouth daily.   metFORMIN 500 MG tablet Commonly known as: GLUCOPHAGE Take by mouth 2 (two) times daily with a meal.   metoprolol tartrate 25 MG tablet Commonly known as: LOPRESSOR Take 25 mg by mouth 2 (two) times daily.   montelukast 10 MG tablet Commonly known as: SINGULAIR Take 10 mg by mouth at bedtime.   predniSONE 50 MG tablet Commonly known as: DELTASONE She has 3 more days of 100 mg prednisone after which she will need to follow-up with her outpatient oncologist. Start taking on: May 05, 2020   prochlorperazine 10 MG tablet Commonly known as: COMPAZINE Take 1 tablet (10 mg total) by mouth every 6 (six) hours  as needed for nausea or vomiting.   rosuvastatin 10 MG tablet Commonly known as: CRESTOR Take 10 mg by mouth daily.       Allergies  Allergen Reactions  . Iodine Anaphylaxis    Told to avoid due to shellfish allergy. Told to avoid due to shellfish allergy.  Marland Kitchen Shellfish-Derived Products Anaphylaxis and Swelling    Eyes swelling, scratchy throat, bumps on lips.    Consultations:  Oncology   Procedures/Studies: DG Lumbar Spine Complete  Result Date: 04/21/2020 CLINICAL DATA:  Back pain. EXAM: LUMBAR SPINE - COMPLETE 4+ VIEW COMPARISON:  June 16, 2018 FINDINGS: There is no evidence of lumbar spine fracture. Alignment is normal. There mild disc height loss at the L5-S1 level. IMPRESSION: Negative. Electronically Signed   By: Constance Holster M.D.    On: 04/21/2020 20:45   CT CHEST WO CONTRAST  Result Date: 04/22/2020 CLINICAL DATA:  Back pain.  Anaplastic large T-cell lymphoma. EXAM: CT CHEST WITHOUT CONTRAST TECHNIQUE: Multidetector CT imaging of the chest was performed following the standard protocol without IV contrast. COMPARISON:  PET-CT scan 02/28/2020, CT chest 10/07/2019 FINDINGS: Cardiovascular: No significant vascular findings. Normal heart size. No pericardial effusion. Port in the anterior chest wall with tip in distal SVC. Mediastinum/Nodes: No axillary or supraclavicular adenopathy. No mediastinal hilar adenopathy. Lungs/Pleura: There are new bilateral innumerable pulmonary nodules of varying size. Size range from 2 mm to 10 mm. (Image 86/5 in the LEFT lower lobe for example). Nodules involve all the lobes of the lung. There is a solitary very large nodule in the lingula measuring 2.6 cm. These nodules are new from recent PET-CT Upper Abdomen: Limited view of the liver, kidneys, pancreas are unremarkable. Normal adrenal glands. Musculoskeletal: No aggressive osseous lesion. IMPRESSION: 1. Rapid development bilateral multinodular pulmonary metastasis. Innumerable nodules within LEFT and RIGHT lung which are new from PET-CT scan 02/28/2020. 2. No adenopathy on noncontrast exam 3. No skeletal lesion identified. Electronically Signed   By: Suzy Bouchard M.D.   On: 04/22/2020 09:08   CT LUMBAR SPINE WO CONTRAST  Result Date: 04/22/2020 CLINICAL DATA:  Fever, sepsis, low back pain; low back pain, no red flags. EXAM: CT LUMBAR SPINE WITHOUT CONTRAST TECHNIQUE: Multidetector CT imaging of the lumbar spine was performed without intravenous contrast administration. Multiplanar CT image reconstructions were also generated. COMPARISON:  Radiographs of the lumbar spine 06/16/2018, CT abdomen/pelvis 06/14/2015 FINDINGS: Segmentation: 5 lumbar vertebrae. Alignment: Straightening of the expected lumbar lordosis. Trace L4-L5 grade 1 anterolisthesis.  Vertebrae: Vertebral body height is maintained. No evidence of acute fracture. No aggressive osseous lesion. Paraspinal and other soft tissues: Aortoiliac atherosclerosis. Paraspinal soft tissues within normal limits. Disc levels: No more than mild disc space narrowing at any level. T12-L1: No appreciable significant disc herniation or stenosis. L1-L2: No appreciable significant disc herniation or stenosis. L2-L3: No appreciable significant disc herniation or stenosis. L3-L4: Small disc bulge. Mild facet arthrosis/ligamentum flavum hypertrophy. Apparent mild spinal canal narrowing. No significant foraminal stenosis. L4-L5: Trace anterolisthesis. Disc bulge. Facet arthrosis/ligamentum flavum hypertrophy. Suspected at least moderate bilateral subarticular and central canal narrowing. Moderate bilateral neural foraminal narrowing. L5-S1: Small disc bulge with endplate spurring. Mild facet arthrosis/ligamentum flavum hypertrophy. Mild bilateral subarticular narrowing. The central canal appears patent. Mild right neural foraminal narrowing. IMPRESSION: No CT evidence of discitis/osteomyelitis within the lumbar spine. Consider contrast-enhanced MRI for further evaluation, as clinically warranted. Lumbar spondylosis as described and most notably as follows. At L4-L5, there is trace anterolisthesis. Disc bulge. Facet arthrosis/ligamentum flavum hypertrophy. Suspected  at least moderate bilateral subarticular and central canal narrowing. Moderate bilateral neural foraminal narrowing. Aortoiliac atherosclerosis. Electronically Signed   By: Kellie Simmering DO   On: 04/22/2020 08:32   CT ABDOMEN PELVIS W CONTRAST  Result Date: 04/26/2020 CLINICAL DATA:  Fever. EXAM: CT ABDOMEN AND PELVIS WITH CONTRAST TECHNIQUE: Multidetector CT imaging of the abdomen and pelvis was performed using the standard protocol following bolus administration of intravenous contrast. CONTRAST:  134m OMNIPAQUE IOHEXOL 300 MG/ML  SOLN COMPARISON:  June 14, 2015. Apr 22, 2020. FINDINGS: Lower chest: Multiple pulmonary nodules are noted in the visualized lung bases consistent with metastatic disease. Mild right basilar atelectasis is noted. Small bilateral pleural effusions are noted. Hepatobiliary: No focal liver abnormality is seen. No gallstones, gallbladder wall thickening, or biliary dilatation. Pancreas: Unremarkable. No pancreatic ductal dilatation or surrounding inflammatory changes. Spleen: Normal in size without focal abnormality. Adrenals/Urinary Tract: Adrenal glands are unremarkable. Kidneys are normal, without renal calculi, focal lesion, or hydronephrosis. Bladder is unremarkable. Stomach/Bowel: Stomach is within normal limits. Appendix appears normal. No evidence of bowel wall thickening, distention, or inflammatory changes. Vascular/Lymphatic: No significant vascular findings are present. No enlarged abdominal or pelvic lymph nodes. Reproductive: Status post hysterectomy. No adnexal masses. Other: No abdominal wall hernia or abnormality. No abdominopelvic ascites. Musculoskeletal: No acute or significant osseous findings. IMPRESSION: 1. Multiple pulmonary nodules are noted in the visualized lung bases consistent with metastatic disease. 2. Small bilateral pleural effusions are noted. 3. No other abnormality seen in the abdomen or pelvis. Electronically Signed   By: JMarijo ConceptionM.D.   On: 04/26/2020 20:45   DG CHEST PORT 1 VIEW  Result Date: 04/27/2020 CLINICAL DATA:  Fever and short breath. History of lung and renal cell carcinoma EXAM: PORTABLE CHEST 1 VIEW COMPARISON:  Apr 25, 2020 FINDINGS: There is diffuse nodular interstitial disease throughout the lungs. There is ill-defined airspace opacity in the right base with small right pleural effusion. Heart size and pulmonary vascular normal. No adenopathy. There is aortic atherosclerosis. Port-A-Cath tip is in the superior vena cava near the cavoatrial junction. No bone lesions. IMPRESSION:  Diffuse nodular interstitial disease with questionable lymphangitic spread of tumor given the clinical history in the numeral small nodular opacities. Consolidation with pleural effusion right base. No new opacity evident. Stable cardiac silhouette. No adenopathy. Stable Port-A-Cath position. Aortic Atherosclerosis (ICD10-I70.0). Electronically Signed   By: WLowella GripIII M.D.   On: 04/27/2020 13:50   DG CHEST PORT 1 VIEW  Result Date: 04/25/2020 CLINICAL DATA:  Post bronchoscopy EXAM: PORTABLE CHEST 1 VIEW COMPARISON:  CT 04/22/2020, radiograph 04/21/2020 FINDINGS: Redemonstration of the innumerable pulmonary nodules throughout both lungs better seen on comparison CT from 04/22/2020. There is increasing hazy opacity towards the lung bases and periphery with central vascular cuffing and congestion as well as septal and fissural thickening. No pneumothorax. Small right pleural effusion. No visible left effusion or pneumothorax. Cardiomediastinal contours are stable. Accessed left IJ approach Port-A-Cath tip terminates at the superior cavoatrial junction. Telemetry leads overlie the chest. No acute osseous or soft tissue abnormality. Degenerative changes are present in the imaged spine and shoulders. IMPRESSION: 1. Innumerable pulmonary nodules compatible with metastatic disease throughout both lungs better seen on comparison CT from 04/22/2020. 2. Worsening hazy opacity towards the lung bases and periphery with central vascular cuffing and septal and fissural thickening could suggest worsening edema and some postprocedural atelectasis, now with small right effusion. Electronically Signed   By: PElwin SleightD.  On: 04/25/2020 18:46   DG Chest Port 1 View  Result Date: 04/21/2020 CLINICAL DATA:  Fever EXAM: PORTABLE CHEST 1 VIEW COMPARISON:  Chest radiograph dated 06/16/2018 FINDINGS: The heart size is within normal limits. Vascular calcifications are seen in the aortic arch. Mild-to-moderate patchy  diffuse bilateral interstitial and airspace opacities are seen. There is no pleural effusion or pneumothorax. The visualized skeletal structures are unremarkable. A left internal jugular central venous port catheter tip overlies the superior vena cava. IMPRESSION: Mild-to-moderate patchy diffuse bilateral interstitial and airspace opacities may reflect pneumonia and/or pulmonary edema. Aortic Atherosclerosis (ICD10-I70.0). Electronically Signed   By: Zerita Boers M.D.   On: 04/21/2020 19:32   DG C-ARM BRONCHOSCOPY  Result Date: 04/25/2020 C-ARM BRONCHOSCOPY: Fluoroscopy was utilized by the requesting physician.  No radiographic interpretation.     Subjective: She denies having any major complaints at this time.  Discharge Exam: Vitals:   05/04/20 0526 05/04/20 0934  BP: 127/81 124/80  Pulse: 80 92  Resp: 18 18  Temp: 98 F (36.7 C) 98.1 F (36.7 C)  SpO2: 97% 97%   Vitals:   05/03/20 1644 05/03/20 2050 05/04/20 0526 05/04/20 0934  BP: 128/80 136/81 127/81 124/80  Pulse: 92 88 80 92  Resp: 15 18 18 18   Temp: 98.1 F (36.7 C) 98.3 F (36.8 C) 98 F (36.7 C) 98.1 F (36.7 C)  TempSrc:    Oral  SpO2: 96% 97% 97% 97%  Weight:      Height:        General: Pt is alert, awake, not in acute distress Cardiovascular: RRR, S1/S2 +, no rubs, no gallops Respiratory: CTA bilaterally, no wheezing, no rhonchi Abdominal: Soft, NT, ND, bowel sounds + Extremities: no edema, no cyanosis    The results of significant diagnostics from this hospitalization (including imaging, microbiology, ancillary and laboratory) are listed below for reference.     Microbiology: Recent Results (from the past 240 hour(s))  Fungus Culture With Stain     Status: None (Preliminary result)   Collection Time: 04/25/20  2:46 PM   Specimen: Bronchial Washing, Left; Lung  Result Value Ref Range Status   Fungus Stain Final report  Final    Comment: (NOTE) Performed At: [facility] [place], [place] [id number] Rush Farmer MD PE:[id number]    Fungus (Mycology) Culture PENDING  Incomplete   Fungal Source BRONCHIAL ALVEOLAR LAVAGE  Final    Comment: LEFT  Culture, respiratory     Status: None   Collection Time: 04/25/20  2:46 PM   Specimen: Bronchial Washing, Left; Lung  Result Value Ref Range Status   Specimen Description BRONCHIAL ALVEOLAR LAVAGE LEFT  Final   Special Requests NONE  Final   Gram Stain   Final    FEW WBC PRESENT, PREDOMINANTLY PMN NO ORGANISMS SEEN    Culture   Final    NO GROWTH 2 DAYS Performed at [facility], [place] [address], [place], [place] [address]    Report Status 04/27/2020 FINAL  Final  Acid Fast Smear (AFB)     Status: None   Collection Time: 04/25/20  2:46 PM   Specimen: Bronchial Washing, Left; Lung  Result Value Ref Range Status   AFB Specimen Processing Concentration  Final   Acid Fast Smear Negative  Final    Comment: (NOTE) Performed At: [facility] [address] [place], [place] [id number] Rush Farmer MD PP:[id number]    Source (AFB) BRONCHIAL ALVEOLAR LAVAGE  Final  Comment: LEFT  Fungus Culture Result     Status: None   Collection Time: 04/25/20  2:46 PM  Result Value Ref Range Status   Result 1 Comment  Final    Comment: (NOTE) KOH/Calcofluor preparation:  no fungus observed. Performed At: [facility] [place], [place] [id number] Rush Farmer MD VV:[id number]      Labs: BNP (last 3 results) Recent Labs    04/21/20 1935  BNP 26.9   Basic Metabolic Panel: Recent Labs  Lab 04/28/20 0310 04/29/20 0413 04/30/20 0412 05/01/20 0426 05/04/20 0414  NA  --  139 137 138 141  K  --  3.5 4.0 4.3 3.6  CL  --  101 101 103 104  CO2  --  29 28 26 26   GLUCOSE  --  110* 117* 105* 280*  BUN  --  10 11 12 20   CREATININE 0.66 0.68 0.70 0.65 0.61  CALCIUM  --  9.0 8.8* 9.0 8.8*  MG 1.6*  --   --   --  2.0  PHOS 3.7  --   --   --   --    Liver Function  Tests: Recent Labs  Lab 04/29/20 0413 04/30/20 0412 05/01/20 0426  AST 53* 62* 62*  ALT 77* 86* 88*  ALKPHOS 65 70 68  BILITOT 0.6 0.6 0.5  PROT 6.5 6.4* 6.5  ALBUMIN 2.0* 1.9* 1.9*   No results for input(s): LIPASE, AMYLASE in the last 168 hours. No results for input(s): AMMONIA in the last 168 hours. CBC: Recent Labs  Lab 04/30/20 0412 05/01/20 0426 05/02/20 0400 05/03/20 0417 05/04/20 0414  WBC 10.9* 8.6 7.8 14.1* 11.5*  NEUTROABS 6.9 5.3 5.3 9.8* 7.4  HGB 8.2* 7.9* 8.0* 8.6* 7.7*  HCT 26.3* 25.8* 27.2* 27.1* 24.5*  MCV 84.0 84.3 86.3 84.4 84.2  PLT 155 132* 106* 180 153   Cardiac Enzymes: No results for input(s): CKTOTAL, CKMB, CKMBINDEX, TROPONINI in the last 168 hours. BNP: Invalid input(s): POCBNP CBG: Recent Labs  Lab 05/03/20 1113 05/03/20 1645 05/03/20 2047 05/04/20 0643 05/04/20 1101  GLUCAP 103* 351* 308* 174* 296*   D-Dimer No results for input(s): DDIMER in the last 72 hours. Hgb A1c No results for input(s): HGBA1C in the last 72 hours. Lipid Profile No results for input(s): CHOL, HDL, LDLCALC, TRIG, CHOLHDL, LDLDIRECT in the last 72 hours. Thyroid function studies No results for input(s): TSH, T4TOTAL, T3FREE, THYROIDAB in the last 72 hours.  Invalid input(s): FREET3 Anemia work up No results for input(s): VITAMINB12, FOLATE, FERRITIN, TIBC, IRON, RETICCTPCT in the last 72 hours. Urinalysis    Component Value Date/Time   COLORURINE YELLOW 04/21/2020 1844   APPEARANCEUR HAZY (A) 04/21/2020 1844   LABSPEC 1.012 04/21/2020 1844   PHURINE 6.0 04/21/2020 1844   GLUCOSEU NEGATIVE 04/21/2020 1844   HGBUR MODERATE (A) 04/21/2020 1844   BILIRUBINUR NEGATIVE 04/21/2020 1844   KETONESUR NEGATIVE 04/21/2020 1844   PROTEINUR 100 (A) 04/21/2020 1844   UROBILINOGEN 0.2 08/26/2013 0814   NITRITE NEGATIVE 04/21/2020 1844   LEUKOCYTESUR TRACE (A) 04/21/2020 1844   Sepsis Labs Invalid input(s): PROCALCITONIN,  WBC,   LACTICIDVEN Microbiology Recent Results (from the past 240 hour(s))  Fungus Culture With Stain     Status: None (Preliminary result)   Collection Time: 04/25/20  2:46 PM   Specimen: Bronchial Washing, Left; Lung  Result Value Ref Range Status   Fungus Stain Final report  Final    Comment: (NOTE) Performed At: [place]  [place], [place] [id number] Rush Farmer MD [id number]    Fungus (Mycology) Culture PENDING  Incomplete   Fungal Source BRONCHIAL ALVEOLAR LAVAGE  Final    Comment: LEFT  Culture, respiratory     Status: None   Collection Time: 04/25/20  2:46 PM   Specimen: Bronchial Washing, Left; Lung  Result Value Ref Range Status   Specimen Description BRONCHIAL ALVEOLAR LAVAGE LEFT  Final   Special Requests NONE  Final   Gram Stain   Final    FEW WBC PRESENT, PREDOMINANTLY PMN NO ORGANISMS SEEN    Culture   Final    NO GROWTH 2 DAYS Performed at [facility], [place] [address] [place], [place] [address]    Report Status 04/27/2020 FINAL  Final  Acid Fast Smear (AFB)     Status: None   Collection Time: 04/25/20  2:46 PM   Specimen: Bronchial Washing, Left; Lung  Result Value Ref Range Status   AFB Specimen Processing Concentration  Final   Acid Fast Smear Negative  Final    Comment: (NOTE) Performed At: [facility] [place], [place] [id number] Rush Farmer MD [id number]    Source (AFB) BRONCHIAL ALVEOLAR LAVAGE  Final    Comment: LEFT  Fungus Culture Result     Status: None   Collection Time: 04/25/20  2:46 PM  Result Value Ref Range Status   Result 1 Comment  Final    Comment: (NOTE) KOH/Calcofluor preparation:  no fungus observed. Performed At: [facility] [place], [place] [id number] Rush Farmer MD [id number]      Time coordinating discharge: Over 30 minutes  SIGNED:   Yaakov Guthrie, MD  [facility] 05/04/2020, 3:35 PM Pager on [email or web address]  If 7PM-7AM,  please contact night-coverage [email or web address] Password [email or web address]

## 2020-05-04 NOTE — TOC Initial Note (Signed)
Transition of Care [facility]) - Initial/Assessment Note    Patient Details  Name: Megan Mcknight MRN: [id number] Date of Birth: 05/19/1961  Transition of Care [facility]) CM/SW Contact:    Bartholomew Crews, RN Phone Number: [phone] 05/04/2020, 10:56 AM  Clinical Narrative:                  Spoke with patient at the bedside. PTA home alone, but one of her sons is able to stay with her as needed. Has a walker at home that belonged to her mom that is in good working order. She is agreeable to [facility] PT per recommendations, and remembers that [facility] had provided services to her mom. Referral pending with [facility]. Patient will need HH orders for PT with Face to Face. Patient reports no problems with medications or transportation. TOC following for transition needs.   Expected Discharge Plan: [place] Barriers to Discharge: Continued Medical Work up   Patient Goals and CMS Choice   CMS Medicare.gov Compare Post Acute Care list provided to:: Patient Choice offered to / list presented to : Patient  Expected Discharge Plan and Services Expected Discharge Plan: [place] In-house Referral: NA Discharge Planning Services: CM Consult Post Acute Care Choice: [place] arrangements for the past 2 months: Single Family Home                 DME Arranged: N/A DME Agency: NA       HH Arranged: PT          Prior Living Arrangements/Services Living arrangements for the past 2 months: Single Family Home Lives with:: Self Patient language and need for interpreter reviewed:: Yes Do you feel safe going back to the place where you live?: Yes      Need for Family Participation in Patient Care: Yes (Comment) Care giver support system in place?: Yes (comment) Current home services: DME(has a walker that was her mother's) Criminal Activity/Legal Involvement Pertinent to Current Situation/Hospitalization: No - Comment as needed  Activities of Daily Living Home  Assistive Devices/Equipment: Eyeglasses ADL Screening (condition at time of admission) Patient's cognitive ability adequate to safely complete daily activities?: Yes Is the patient deaf or have difficulty hearing?: No Does the patient have difficulty seeing, even when wearing glasses/contacts?: No Does the patient have difficulty concentrating, remembering, or making decisions?: No Patient able to express need for assistance with ADLs?: Yes Does the patient have difficulty dressing or bathing?: No Independently performs ADLs?: Yes (appropriate for developmental age) Does the patient have difficulty walking or climbing stairs?: No Weakness of Legs: None Weakness of Arms/Hands: None  Permission Sought/Granted                  Emotional Assessment Appearance:: Appears stated age Attitude/Demeanor/Rapport: Engaged Affect (typically observed): Accepting Orientation: : Oriented to Self, Oriented to  Time, Oriented to Place, Oriented to Situation Alcohol / Substance Use: Not Applicable Psych Involvement: No (comment)  Admission diagnosis:  SIRS (systemic inflammatory response syndrome) (HCC) [R65.10] Sepsis due to pneumonia ([place]) [J18.9, A41.9] Community acquired pneumonia, unspecified laterality [J18.9] Acute right-sided low back pain, unspecified whether sciatica present [M54.5] Patient Active Problem List   Diagnosis Date Noted  . Acute right-sided low back pain   . S/P bronchoscopy   . FUO (fever of unknown origin) 04/26/2020  . Lung nodules   . Sepsis due to undetermined organism ([place]) 04/22/2020  . Lobar pneumonia ([place]) 04/22/2020  . 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 Sepsis due to pneumonia ([place]) 04/21/2020  . Type 2 diabetes mellitus with hyperglycemia, without long-term current use of insulin ([place]) 02/20/2020  . Essential hypertension, benign 02/20/2020  . Mixed hyperlipidemia 02/20/2020  . Port-A-Cath in place  01/12/2020  . Trichomonal infection 02/17/2019  . PTTD (posterior tibial tendon dysfunction) 08/21/2015  . Transaminitis 05/31/2014  . Inguinal adenopathy 10/20/2013  . Breast mass, left 10/20/2013  . Neuropathy due to chemotherapeutic drug ([place]) 09/29/2013  . Tumor lysis syndrome 08/21/2013  . Hyperglycemia without ketosis 08/21/2013  . Acute renal failure ([place]) 08/21/2013  . Acute encephalopathy 08/21/2013  . Hyperkalemia 08/21/2013  . Lymphoma ([place]) 05/19/2013  . Shortness of breath 05/06/2013  . Atrial fibrillation with RVR ([place]) 05/06/2013  . Bilateral pneumonia 05/06/2013  . Acute renal insufficiency 05/06/2013  . Normocytic anemia 05/06/2013  . GERD (gastroesophageal reflux disease) 02/02/2012   PCP:  Rosita Fire, MD Pharmacy:   [id number] - [place], [address] [place] [address] Phone: [phone] Fax: [phone]     Social Determinants of Health (SDOH) Interventions    Readmission Risk Interventions No flowsheet data found.

## 2020-05-04 NOTE — TOC Transition Note (Signed)
Transition of Care [facility]) - CM/SW Discharge Note   Patient Details  Name: Megan Mcknight MRN: [id number] Date of Birth: 10-26-1961  Transition of Care [facility]) CM/SW Contact:  Bartholomew Crews, RN Phone Number: [phone] 05/04/2020, 4:20 PM   Clinical Narrative:     [facility] unable to accept referral d/t staffing. Referral accepted for PT by [facility]. No further TOC needs identified.    Final next level of care: [place] Barriers to Discharge: No Barriers Identified   Patient Goals and CMS Choice Patient states their goals for this hospitalization and ongoing recovery are:: return home CMS Medicare.gov Compare Post Acute Care list provided to:: Patient Choice offered to / list presented to : Patient  Discharge Placement                       Discharge Plan and Services In-house Referral: NA Discharge Planning Services: CM Consult Post Acute Care Choice: Home Health          DME Arranged: N/A DME Agency: NA       HH Arranged: PT HH Agency: [facility] Date [place]: 05/04/20 Time [place]: [id number] Representative spoke with at [place]: Cassie  Social Determinants of Health ([place]) Interventions     Readmission Risk Interventions No flowsheet data found.

## 2020-05-04 NOTE — Progress Notes (Signed)
Megan Mcknight to be discharged Home per MD order. Discussed prescriptions and follow up appointments with the patient. Prescriptions given to patient; medication list explained in detail. Patient verbalized understanding.  Skin clean, dry and intact without evidence of skin break down, no evidence of skin tears noted. Porta cath deaccessed.  No complaints noted.  Reminded to take Lantus at bedtime.  Patient free of lines, drains, and wounds.   An After Visit Summary (AVS) was printed and given to the patient. Patient escorted via wheelchair, and discharged home via private auto.  [name], RN

## 2020-05-04 NOTE — Progress Notes (Signed)
Events noted. Megan Mcknight feels better at this time without any recent complaints.  She denies any fevers and her dyspnea appears to have improved.  She is anticipating discharge today once home health has been arranged.  I have no objections to discharge today and she will continue prednisone 100 mg daily till Monday, May 24 which will be her last dose.  She will have follow-up at [facility] regarding her lymphoma scheduled on May 09, 2020.  She is aware of this appointment and she will be contacted specifically with the exact time.

## 2020-05-04 NOTE — Progress Notes (Signed)
Occupational Therapy Evaluation Patient Details Name: Megan Mcknight MRN: [id number] DOB: January 03, 1961 Today's Date: 05/04/2020    History of Present Illness 59 yo admitted 5/8 with back pain radiating to RLE (xray negative for fx), pt with fever and bil pulmonary nodules. Biopsy revealed relapsed lymphoma. PMHx: HTN, HLD, DM, asthma, Afib, non Hodgkin lymphoma   Clinical Impression   Patient lives at home and is independent.  Has a lot of help from family if needed.  Patient feeling good today and wanting to complete full body wash up at sink and grooming.  Patient did experience some urine incontinence during session, though she said that is normal for her.  Patient performing ADLs independently/ with modified independence (increased time).  OT will sign off, not recommendations for OT after discharge at this time.    Follow Up Recommendations  No OT follow up    Equipment Recommendations  None recommended by OT    Recommendations for Other Services       Precautions / Restrictions Precautions Precautions: None      Mobility Bed Mobility Overal bed mobility: Modified Independent             General bed mobility comments: With rail  Transfers Overall transfer level: Modified independent Equipment used: None                  Balance Overall balance assessment: No apparent balance deficits (not formally assessed)   Sitting balance-Leahy Scale: Good                                     ADL either performed or assessed with clinical judgement   ADL Overall ADL's : Independent                                       General ADL Comments: Patient completed full body bath, grooming, and dressing independently     Vision         Perception     Praxis      Pertinent Vitals/Pain Pain Assessment: No/denies pain     Hand Dominance Right   Extremity/Trunk Assessment Upper Extremity Assessment Upper Extremity Assessment:  Overall WFL for tasks assessed           Communication Communication Communication: No difficulties   Cognition Arousal/Alertness: Awake/alert Behavior During Therapy: WFL for tasks assessed/performed Overall Cognitive Status: Within Functional Limits for tasks assessed                                     General Comments       Exercises Exercises: Other exercises(Standing ADLs for 30+ min)   Shoulder Instructions      Home Living Family/patient expects to be discharged to:: Private residence Living Arrangements: Alone Available Help at Discharge: Family;Available 24 hours/day Type of Home: House Home Access: Stairs to enter [employer] of Steps: 3 Entrance Stairs-Rails: Left Home Layout: Two level;Able to live on main level with bedroom/bathroom     Bathroom Shower/Tub: Teacher, early years/pre: Standard     Home Equipment: Grab bars - tub/shower          Prior Functioning/Environment Level of Independence: Independent  OT Problem List: Decreased activity tolerance      OT Treatment/Interventions:      OT Goals(Current goals can be found in the care plan section) Acute Rehab OT Goals Patient Stated Goal: return home and take care of myself OT Goal Formulation: With patient Time For Goal Achievement: 05/18/20 Potential to Achieve Goals: Good  OT Frequency:     Barriers to D/C:            Co-evaluation              AM-PAC OT "6 Clicks" Daily Activity     Outcome Measure Help from another person eating meals?: None Help from another person taking care of personal grooming?: None Help from another person toileting, which includes using toliet, bedpan, or urinal?: None Help from another person bathing (including washing, rinsing, drying)?: None Help from another person to put on and taking off regular upper body clothing?: None Help from another person to put on and taking off regular  lower body clothing?: None 6 Click Score: 24   End of Session Nurse Communication: Mobility status  Activity Tolerance: Patient tolerated treatment well Patient left: in chair;with call bell/phone within reach  OT Visit Diagnosis: Muscle weakness (generalized) (M62.81)                Time: 7989-2119 OT Time Calculation (min): 53 min Charges:  OT General Charges $OT Visit: 1 Visit OT Evaluation $OT Eval Moderate Complexity: 1 Mod OT Treatments $Self Care/Home Management : 38-52 mins  August Luz, OTR/L   Phylliss Bob 05/04/2020, 2:18 PM

## 2020-05-10 MED ORDER — ROSUVASTATIN CALCIUM 5 MG PO TABS
10.00 | ORAL_TABLET | ORAL | Status: DC
Start: 2020-05-10 — End: 2020-05-10

## 2020-05-10 MED ORDER — MONTELUKAST SODIUM 10 MG PO TABS
10.00 | ORAL_TABLET | ORAL | Status: DC
Start: 2020-05-13 — End: 2020-05-10

## 2020-05-10 MED ORDER — SENNOSIDES-DOCUSATE SODIUM 8.6-50 MG PO TABS
1.00 | ORAL_TABLET | ORAL | Status: DC
Start: 2020-05-13 — End: 2020-05-10

## 2020-05-10 MED ORDER — INSULIN LISPRO 100 UNIT/ML ~~LOC~~ SOLN
2.00 | SUBCUTANEOUS | Status: DC
Start: 2020-05-10 — End: 2020-05-10

## 2020-05-10 MED ORDER — SODIUM CHLORIDE FLUSH 0.9 % IV SOLN
20.00 | INTRAVENOUS | Status: DC
Start: ? — End: 2020-05-10

## 2020-05-10 MED ORDER — ALBUTEROL SULFATE (2.5 MG/3ML) 0.083% IN NEBU
2.50 | INHALATION_SOLUTION | RESPIRATORY_TRACT | Status: DC
Start: ? — End: 2020-05-10

## 2020-05-10 MED ORDER — SODIUM CHLORIDE FLUSH 0.9 % IV SOLN
20.00 | INTRAVENOUS | Status: DC
Start: 2020-05-13 — End: 2020-05-10

## 2020-05-10 MED ORDER — AMLODIPINE BESYLATE 5 MG PO TABS
5.00 | ORAL_TABLET | ORAL | Status: DC
Start: 2020-05-14 — End: 2020-05-10

## 2020-05-10 MED ORDER — PANTOPRAZOLE SODIUM 40 MG PO TBEC
40.00 | DELAYED_RELEASE_TABLET | ORAL | Status: DC
Start: 2020-05-14 — End: 2020-05-10

## 2020-05-10 MED ORDER — GLUCOSE 40 % PO GEL
15.00 | ORAL | Status: DC
Start: ? — End: 2020-05-10

## 2020-05-10 MED ORDER — METOPROLOL TARTRATE 25 MG PO TABS
25.00 | ORAL_TABLET | ORAL | Status: DC
Start: 2020-05-10 — End: 2020-05-10

## 2020-05-10 MED ORDER — DEXTROSE 10 % IV SOLN
125.00 | INTRAVENOUS | Status: DC
Start: ? — End: 2020-05-10

## 2020-05-10 MED ORDER — LOSARTAN POTASSIUM 25 MG PO TABS
25.00 | ORAL_TABLET | ORAL | Status: DC
Start: 2020-05-14 — End: 2020-05-10

## 2020-05-11 MED ORDER — LORAZEPAM 1 MG PO TABS
1.00 | ORAL_TABLET | ORAL | Status: DC
Start: ? — End: 2020-05-11

## 2020-05-11 MED ORDER — SODIUM CHLORIDE 0.9 % IV SOLN
100.00 | INTRAVENOUS | Status: DC
Start: ? — End: 2020-05-11

## 2020-05-11 MED ORDER — EPINEPHRINE PF 1 MG/ML IJ SOLN
0.30 | INTRAMUSCULAR | Status: DC
Start: ? — End: 2020-05-11

## 2020-05-11 MED ORDER — OLANZAPINE 10 MG PO TABS
10.00 | ORAL_TABLET | ORAL | Status: DC
Start: 2020-05-11 — End: 2020-05-11

## 2020-05-11 MED ORDER — GENERIC EXTERNAL MEDICATION
1.00 | Status: DC
Start: 2020-05-11 — End: 2020-05-11

## 2020-05-11 MED ORDER — METOPROLOL TARTRATE 25 MG PO TABS
25.00 | ORAL_TABLET | ORAL | Status: DC
Start: 2020-05-13 — End: 2020-05-11

## 2020-05-11 MED ORDER — BENZONATATE 100 MG PO CAPS
100.00 | ORAL_CAPSULE | ORAL | Status: DC
Start: ? — End: 2020-05-11

## 2020-05-11 MED ORDER — GENERIC EXTERNAL MEDICATION
750.00 | Status: DC
Start: 2020-05-12 — End: 2020-05-11

## 2020-05-11 MED ORDER — SODIUM CHLORIDE 0.9 % IV SOLN
INTRAVENOUS | Status: DC
Start: ? — End: 2020-05-11

## 2020-05-11 MED ORDER — DIPHENHYDRAMINE HCL 50 MG/ML IJ SOLN
25.00 | INTRAMUSCULAR | Status: DC
Start: ? — End: 2020-05-11

## 2020-05-11 MED ORDER — METHYLPREDNISOLONE SODIUM SUCC 125 MG IJ SOLR
125.00 | INTRAMUSCULAR | Status: DC
Start: ? — End: 2020-05-11

## 2020-05-11 MED ORDER — GENERIC EXTERNAL MEDICATION
100.00 | Status: DC
Start: 2020-05-12 — End: 2020-05-11

## 2020-05-11 MED ORDER — PROCHLORPERAZINE MALEATE 5 MG PO TABS
10.00 | ORAL_TABLET | ORAL | Status: DC
Start: ? — End: 2020-05-11

## 2020-05-11 MED ORDER — ONDANSETRON HCL 8 MG PO TABS
8.00 | ORAL_TABLET | ORAL | Status: DC
Start: 2020-05-13 — End: 2020-05-11

## 2020-05-11 MED ORDER — GENERIC EXTERNAL MEDICATION
2500.00 | Status: DC
Start: 2020-05-12 — End: 2020-05-11

## 2020-05-11 MED ORDER — AZITHROMYCIN 250 MG PO TABS
500.00 | ORAL_TABLET | ORAL | Status: DC
Start: 2020-05-12 — End: 2020-05-11

## 2020-05-11 MED ORDER — DEXAMETHASONE 4 MG PO TABS
12.00 | ORAL_TABLET | ORAL | Status: DC
Start: 2020-05-14 — End: 2020-05-11

## 2020-05-11 NOTE — Progress Notes (Signed)
Pharmacist Chemotherapy Monitoring - Follow Up Assessment    I verify that I have reviewed each item in the below checklist:  . Regimen for the patient is scheduled for the appropriate day and plan matches scheduled date. Marland Kitchen Appropriate non-routine labs are ordered dependent on drug ordered. . If applicable, additional medications reviewed and ordered per protocol based on lifetime cumulative doses and/or treatment regimen.   Plan for follow-up and/or issues identified: No . I-vent associated with next due treatment: No . MD and/or nursing notified: No  Raianna Slight D 05/11/2020 4:22 PM

## 2020-05-13 DIAGNOSIS — I444 Left anterior fascicular block: Secondary | ICD-10-CM | POA: Diagnosis not present

## 2020-05-13 DIAGNOSIS — R000 Tachycardia, unspecified: Secondary | ICD-10-CM | POA: Diagnosis not present

## 2020-05-15 ENCOUNTER — Ambulatory Visit: Payer: BC Managed Care – PPO | Admitting: Nutrition

## 2020-05-15 ENCOUNTER — Ambulatory Visit: Payer: BC Managed Care – PPO | Admitting: "Endocrinology

## 2020-05-15 ENCOUNTER — Telehealth: Payer: Self-pay

## 2020-05-15 DIAGNOSIS — Z7689 Persons encountering health services in other specified circumstances: Secondary | ICD-10-CM | POA: Diagnosis not present

## 2020-05-15 DIAGNOSIS — C8460 Anaplastic large cell lymphoma, ALK-positive, unspecified site: Secondary | ICD-10-CM | POA: Diagnosis not present

## 2020-05-15 MED ORDER — GUAIFENESIN-CODEINE 100-10 MG/5ML PO SYRP
10.00 | ORAL_SOLUTION | ORAL | Status: DC
Start: ? — End: 2020-05-15

## 2020-05-15 MED ORDER — CIPROFLOXACIN HCL 500 MG PO TABS
500.00 | ORAL_TABLET | ORAL | Status: DC
Start: 2020-05-13 — End: 2020-05-15

## 2020-05-15 NOTE — Telephone Encounter (Signed)
TC from Pt stating that she had treatment a [facility] this past Fri, Sat, and Sun Pt wanted to know if she should still come to her appointments scheduled with Dr. Alen Blew, Per Dr. Alen Blew appointments canceled. Informed Pt. She will have her treatments at [place]. Verbalized understanding.

## 2020-05-17 ENCOUNTER — Other Ambulatory Visit: Payer: BC Managed Care – PPO

## 2020-05-17 ENCOUNTER — Ambulatory Visit: Payer: BC Managed Care – PPO

## 2020-05-17 ENCOUNTER — Ambulatory Visit: Payer: BC Managed Care – PPO | Admitting: Oncology

## 2020-05-24 LAB — FUNGUS CULTURE RESULT

## 2020-05-24 LAB — FUNGAL ORGANISM REFLEX

## 2020-05-24 LAB — FUNGUS CULTURE WITH STAIN

## 2020-05-27 ENCOUNTER — Other Ambulatory Visit: Payer: Self-pay | Admitting: "Endocrinology

## 2020-05-28 DIAGNOSIS — C787 Secondary malignant neoplasm of liver and intrahepatic bile duct: Secondary | ICD-10-CM | POA: Diagnosis not present

## 2020-05-28 DIAGNOSIS — C7951 Secondary malignant neoplasm of bone: Secondary | ICD-10-CM | POA: Diagnosis not present

## 2020-05-28 DIAGNOSIS — C7801 Secondary malignant neoplasm of right lung: Secondary | ICD-10-CM | POA: Diagnosis not present

## 2020-05-28 DIAGNOSIS — C8460 Anaplastic large cell lymphoma, ALK-positive, unspecified site: Secondary | ICD-10-CM | POA: Diagnosis not present

## 2020-05-29 ENCOUNTER — Ambulatory Visit (INDEPENDENT_AMBULATORY_CARE_PROVIDER_SITE_OTHER): Payer: BC Managed Care – PPO | Admitting: "Endocrinology

## 2020-05-29 ENCOUNTER — Other Ambulatory Visit: Payer: Self-pay

## 2020-05-29 ENCOUNTER — Encounter: Payer: Self-pay | Admitting: "Endocrinology

## 2020-05-29 VITALS — BP 111/74 | HR 91 | Ht 68.0 in | Wt 198.4 lb

## 2020-05-29 DIAGNOSIS — E1165 Type 2 diabetes mellitus with hyperglycemia: Secondary | ICD-10-CM | POA: Diagnosis not present

## 2020-05-29 DIAGNOSIS — I10 Essential (primary) hypertension: Secondary | ICD-10-CM

## 2020-05-29 DIAGNOSIS — E782 Mixed hyperlipidemia: Secondary | ICD-10-CM

## 2020-05-29 MED ORDER — LANTUS SOLOSTAR 100 UNIT/ML ~~LOC~~ SOPN: 60.0000 [IU] | PEN_INJECTOR | Freq: Every day | SUBCUTANEOUS | 2 refills | Status: DC

## 2020-05-29 NOTE — Patient Instructions (Signed)

## 2020-05-29 NOTE — Progress Notes (Signed)
05/29/2020, 6:53 PM  Endocrinology follow-up note   Subjective:    Patient ID: Megan Mcknight, female    DOB: Mar 17, 1961.  Megan Mcknight is being seen in follow-up after she was seen in consultation for management of currently uncontrolled symptomatic diabetes requested by  Rosita Fire, MD.   Past Medical History:  Diagnosis Date  . Acute bronchitis   . Anaplastic large cell lymphoma 2014   remission since 2015  . Asthma   . Cancer ([place] [facility])    lung and kidney  . Colon polyps 02/2013   Per colonoscopy  . Coma ([place])   . Diabetes mellitus without complication ([place])   . Diverticulosis 02/2013   Per colonoscopy  . Dysrhythmia   . Former light tobacco smoker   . GERD (gastroesophageal reflux disease)   . H/O stem cell transplant ([place]) 2015  . Hyperlipidemia   . Nerve pain    Left leg  . Non Hodgkin's lymphoma ([place])   . Seasonal allergies     Past Surgical History:  Procedure Laterality Date  . bones removed from Baby toes    . BRONCHIAL BRUSHINGS  04/25/2020   Procedure: BRONCHIAL BRUSHINGS;  Surgeon: Rigoberto Noel, MD;  Location: [place];  Service: Cardiopulmonary;;  . BRONCHIAL WASHINGS  04/25/2020   Procedure: BRONCHIAL WASHINGS;  Surgeon: Rigoberto Noel, MD;  Location: [place];  Service: Cardiopulmonary;;  . COLONOSCOPY  02/16/2012   Procedure: COLONOSCOPY;  Surgeon: Daneil Dolin, MD;  [facility];  Service: Endoscopy;  Laterality: N/A;  1:30  . COLONOSCOPY N/A 03/26/2017   Procedure: COLONOSCOPY;  Surgeon: Daneil Dolin, MD;  [facility];  Service: Endoscopy;  Laterality: N/A;  930  . IR IMAGING GUIDED PORT INSERTION  12/19/2019  . LUNG BIOPSY  04/25/2020   Procedure: LUNG BIOPSY;  Surgeon: Rigoberto Noel, MD;  Location: [facility];  Service: Cardiopulmonary;;  . MEDIASTINOSCOPY N/A 05/13/2013   Procedure: MEDIASTINOSCOPY;  Surgeon: Melrose Nakayama, MD;  Location: [place];   Service: Thoracic;  Laterality: N/A;  . PARTIAL HYSTERECTOMY    . VAGINAL HYSTERECTOMY     partial hyst   . VIDEO BRONCHOSCOPY N/A 04/25/2020   Procedure: VIDEO BRONCHOSCOPY WITH FLUORO;  Surgeon: Rigoberto Noel, MD;  Location: [place];  Service: Cardiopulmonary;  Laterality: N/A;  . VIDEO BRONCHOSCOPY WITH ENDOBRONCHIAL ULTRASOUND N/A 05/13/2013   Procedure: VIDEO BRONCHOSCOPY WITH ENDOBRONCHIAL ULTRASOUND;  Surgeon: Melrose Nakayama, MD;  Location: [place];  Service: Thoracic;  Laterality: N/A;    Social History   Socioeconomic History  . Marital status: Legally Separated    Spouse name: Not on file  . Number of children: 3  . Years of education: Not on file  . Highest education level: Not on file  Occupational History  . Occupation: Lobbyist: Physicist, medical  Tobacco Use  . Smoking status: Former Smoker    Packs/day: 0.50    Types: Cigarettes    Quit date: 04/19/2013    Years since quitting: 7.1  . Smokeless tobacco: Never Used  Vaping Use  . Vaping Use: Never used  Substance and Sexual Activity  . Alcohol use: No  . Drug use: No  . Sexual  activity: Yes    Birth control/protection: Surgical  Other Topics Concern  . Not on file  Social History Narrative  . Not on file   Social Determinants of Health   Financial Resource Strain:   . Difficulty of Paying Living Expenses:   Food Insecurity:   . Worried About Charity fundraiser in the Last Year:   . Arboriculturist in the Last Year:   Transportation Needs:   . Film/video editor (Medical):   Marland Kitchen Lack of Transportation (Non-Medical):   Physical Activity:   . Days of Exercise per Week:   . Minutes of Exercise per Session:   Stress:   . Feeling of Stress :   Social Connections:   . Frequency of Communication with Friends and Family:   . Frequency of Social Gatherings with Friends and Family:   . Attends Religious Services:   . Active Member of Clubs or Organizations:   . Attends Theatre manager Meetings:   Marland Kitchen Marital Status:     Family History  Problem Relation Age of Onset  . Colon polyps Mother   . Hypertension Mother   . Heart disease Mother   . Hyperlipidemia Mother   . Cancer Father        prostate  . Hypertension Father   . Hyperlipidemia Father   . Heart defect Other        family history   . Asthma Other        family history     Outpatient Encounter Medications as of 05/29/2020  Medication Sig  . ACCU-CHEK GUIDE test strip USE AS INSTRUCTED  . albuterol (PROVENTIL HFA;VENTOLIN HFA) 108 (90 Base) MCG/ACT inhaler Inhale 2 puffs into the lungs every 4 (four) hours as needed for wheezing or shortness of breath.  Marland Kitchen amLODipine (NORVASC) 2.5 MG tablet Take 2.5 mg by mouth daily.   Marland Kitchen ammonium lactate (AMLACTIN) 12 % cream APPLY TO AFFECTED AREA ONCE A DAY FOR 90 DAYS  . blood glucose meter kit and supplies KIT 1 each by Does not apply route 4 (four) times daily. Dispense based on patient and insurance preference. Use up to four times daily as directed. (FOR ICD-9 250.00, 250.01). (Patient not taking: Reported on 04/21/2020)  . Blood Glucose Monitoring Suppl (ACCU-CHEK GUIDE) w/Device KIT 1 Piece by Does not apply route as directed. (Patient not taking: Reported on 04/21/2020)  . Insulin Pen Needle (B-D ULTRAFINE III SHORT PEN) 31G X 8 MM MISC 1 each by Does not apply route as directed. (Patient not taking: Reported on 04/21/2020)  . LANTUS SOLOSTAR 100 UNIT/ML Solostar Pen Inject 60 Units into the skin at bedtime.  Marland Kitchen latanoprost (XALATAN) 0.005 % ophthalmic solution 1 drop at bedtime.  . lidocaine-prilocaine (EMLA) cream Apply 1 application topically as needed.  Marland Kitchen losartan (COZAAR) 25 MG tablet Take 25 mg by mouth daily.   . metFORMIN (GLUCOPHAGE) 500 MG tablet Take by mouth 2 (two) times daily with a meal.  . metoprolol tartrate (LOPRESSOR) 25 MG tablet Take 25 mg by mouth 2 (two) times daily.   . montelukast (SINGULAIR) 10 MG tablet Take 10 mg by mouth at bedtime.   . prochlorperazine (COMPAZINE) 10 MG tablet Take 1 tablet (10 mg total) by mouth every 6 (six) hours as needed for nausea or vomiting.  . rosuvastatin (CRESTOR) 10 MG tablet Take 10 mg by mouth daily.  . [DISCONTINUED] insulin aspart (NOVOLOG) 100 UNIT/ML injection Inject 0-5 Units into the skin 3 (three)  times daily with meals. CBG 70 - 120: 0 units CBG 121 - 150: 1 unit CBG 151 - 200: 2 units CBG 201 - 250: 3 units CBG 251 - 300: 5 units CBG 301 - 350: 7 units CBG 351 - 400: 9 units  . [DISCONTINUED] LANTUS SOLOSTAR 100 UNIT/ML Solostar Pen Inject 70 Units into the skin at bedtime.   No facility-administered encounter medications on file as of 05/29/2020.    ALLERGIES: Allergies  Allergen Reactions  . Iodine Anaphylaxis    Told to avoid due to shellfish allergy. Told to avoid due to shellfish allergy.  Marland Kitchen Shellfish-Derived Products Anaphylaxis and Swelling    Eyes swelling, scratchy throat, bumps on lips.    VACCINATION STATUS: Immunization History  Administered Date(s) Administered  . DTaP / Hep B / IPV 10/04/2014  . HiB (PRP-T) 10/04/2014  . Influenza,inj,Quad PF,6+ Mos 11/04/2013, 10/04/2014  . Influenza-Unspecified 09/14/2018  . Moderna SARS-COVID-2 Vaccination 03/08/2020, 04/10/2020  . Pneumococcal Conjugate-13 10/04/2014    Diabetes She presents for her follow-up diabetic visit. She has type 2 diabetes mellitus. Onset time: She was diagnosed at approximate age of 53 years. Her disease course has been improving. There are no hypoglycemic associated symptoms. Pertinent negatives for hypoglycemia include no confusion, headaches, pallor or seizures. Associated symptoms include fatigue. Pertinent negatives for diabetes include no chest pain, no polydipsia, no polyphagia and no polyuria. There are no hypoglycemic complications. Symptoms are improving. There are no diabetic complications. Risk factors for coronary artery disease include diabetes mellitus, dyslipidemia, family  history, obesity, hypertension, tobacco exposure, sedentary lifestyle and post-menopausal. Current diabetic treatment includes insulin injections (She is currently on Lantus 60 units nightly, Metformin 1000 mg p.o. twice daily.). Her weight is fluctuating minimally. She is following a generally unhealthy diet. When asked about meal planning, she reported none. She has not had a previous visit with a dietitian. She rarely participates in exercise. Her home blood glucose trend is decreasing steadily. Her breakfast blood glucose range is generally 90-110 mg/dl. Her bedtime blood glucose range is generally 130-140 mg/dl. Her overall blood glucose range is 130-140 mg/dl. (She presents with tightly controlled fasting glycemic profile, near target postprandial readings.  Her point-of-care A1c is 8.2%, improving from 14%.  She has no major hypoglycemia documented or reported.  ) An ACE inhibitor/angiotensin II receptor blocker is being taken. Eye exam is current.  Hyperlipidemia This is a chronic problem. The current episode started more than 1 year ago. Exacerbating diseases include diabetes. Factors aggravating her hyperlipidemia include smoking. Pertinent negatives include no chest pain, myalgias or shortness of breath. Current antihyperlipidemic treatment includes statins. Risk factors for coronary artery disease include dyslipidemia, diabetes mellitus, hypertension, obesity, a sedentary lifestyle, post-menopausal and family history.  Hypertension This is a chronic problem. The current episode started more than 1 year ago. Pertinent negatives include no chest pain, headaches, palpitations or shortness of breath. Risk factors for coronary artery disease include diabetes mellitus, dyslipidemia, family history, obesity, post-menopausal state, smoking/tobacco exposure and sedentary lifestyle. Past treatments include angiotensin blockers.    Review of Systems  Constitutional: Positive for fatigue. Negative for  chills, fever and unexpected weight change.  HENT: Negative for trouble swallowing and voice change.   Eyes: Negative for visual disturbance.  Respiratory: Negative for cough, shortness of breath and wheezing.   Cardiovascular: Negative for chest pain, palpitations and leg swelling.  Gastrointestinal: Negative for diarrhea, nausea and vomiting.  Endocrine: Negative for cold intolerance, heat intolerance, polydipsia, polyphagia and polyuria.  Musculoskeletal: Negative  for arthralgias and myalgias.  Skin: Negative for color change, pallor, rash and wound.  Neurological: Negative for seizures and headaches.  Psychiatric/Behavioral: Negative for confusion and suicidal ideas.    Objective:    Vitals with BMI 05/29/2020 05/04/2020 05/04/2020  Height 5' 8"  - -  Weight 198 lbs 6 oz - -  BMI 22.33 - -  Systolic 612 244 975  Diastolic 74 80 81  Pulse 91 92 80    BP 111/74   Pulse 91   Ht 5' 8"  (1.727 m)   Wt 198 lb 6.4 oz (90 kg)   BMI 30.17 kg/m   Wt Readings from Last 3 Encounters:  05/29/20 198 lb 6.4 oz (90 kg)  04/30/20 211 lb 10.3 oz (96 kg)  04/05/20 224 lb 1.6 oz (101.7 kg)     Physical Exam Constitutional:      Appearance: She is well-developed.  HENT:     Head: Normocephalic and atraumatic.  Neck:     Thyroid: No thyromegaly.     Trachea: No tracheal deviation.  Cardiovascular:     Rate and Rhythm: Normal rate and regular rhythm.  Pulmonary:     Effort: Pulmonary effort is normal.  Abdominal:     Palpations: Abdomen is soft.     Tenderness: There is no abdominal tenderness. There is no guarding.  Musculoskeletal:        General: Normal range of motion.     Cervical back: Normal range of motion and neck supple.  Skin:    General: Skin is warm and dry.     Coloration: Skin is not pale.     Findings: No erythema or rash.  Neurological:     Mental Status: She is alert and oriented to person, place, and time.     Cranial Nerves: No cranial nerve deficit.      Coordination: Coordination normal.     Deep Tendon Reflexes: Reflexes are normal and symmetric.  Psychiatric:        Judgment: Judgment normal.       CMP ( most recent) CMP     Component Value Date/Time   NA 141 05/04/2020 0414   NA 144 09/04/2017 0916   K 3.6 05/04/2020 0414   K 4.1 09/04/2017 0916   CL 104 05/04/2020 0414   CO2 26 05/04/2020 0414   CO2 26 09/04/2017 0916   GLUCOSE 280 (H) 05/04/2020 0414   GLUCOSE 197 (H) 09/04/2017 0916   BUN 20 05/04/2020 0414   BUN 15.6 09/04/2017 0916   CREATININE 0.61 05/04/2020 0414   CREATININE 0.84 04/05/2020 0900   CREATININE 1.0 09/04/2017 0916   CALCIUM 8.8 (L) 05/04/2020 0414   CALCIUM 9.5 09/04/2017 0916   PROT 6.5 05/01/2020 0426   PROT 6.8 09/04/2017 0916   ALBUMIN 1.9 (L) 05/01/2020 0426   ALBUMIN 3.8 09/04/2017 0916   AST 62 (H) 05/01/2020 0426   AST 18 04/05/2020 0900   AST 19 09/04/2017 0916   ALT 88 (H) 05/01/2020 0426   ALT 14 04/05/2020 0900   ALT 18 09/04/2017 0916   ALKPHOS 68 05/01/2020 0426   ALKPHOS 49 09/04/2017 0916   BILITOT 0.5 05/01/2020 0426   BILITOT 0.3 04/05/2020 0900   BILITOT 0.49 09/04/2017 0916   GFRNONAA >60 05/04/2020 0414   GFRNONAA >60 04/05/2020 0900   GFRAA >60 05/04/2020 0414   GFRAA >60 04/05/2020 0900     Diabetic Labs (most recent): Lab Results  Component Value Date   HGBA1C 7.4 (H) 04/22/2020  HGBA1C 9.9 (A) 02/20/2020   HGBA1C 12.4 (H) 08/23/2013    Lab Results  Component Value Date   TSH 2.859 05/06/2013   FREET4 0.93 05/06/2013     Assessment & Plan:   1. Type 2 diabetes mellitus with hyperglycemia, without long-term current use of insulin (HCC)  - Raylynne L Markert has currently uncontrolled symptomatic type 2 DM since  59 years of age. She presents with tightly controlled fasting glycemic profile, near target postprandial readings.  Her point-of-care A1c is 8.2%, improving from 14%.  She has no major hypoglycemia documented or reported.   -- Recent labs  reviewed.  Her diabetes is recently exacerbated by concurrent use of steroids during treatment with chemo for lymphoma. - I had a long discussion with her about the progressive nature of diabetes and the pathology behind its complications. -her diabetes is complicated by obesity/sedentary life and she remains at a high risk for more acute and chronic complications which include CAD, CVA, CKD, retinopathy, and neuropathy. These are all discussed in detail with her.  - I have counseled her on diet  and weight management  by adopting a carbohydrate restricted/protein rich diet. Patient is encouraged to switch to  unprocessed or minimally processed     complex starch and increased protein intake (animal or plant source), fruits, and vegetables. -  she is advised to stick to a routine mealtimes to eat 3 meals  a day and avoid unnecessary snacks ( to snack only to correct hypoglycemia).   - she  admits there is a room for improvement in her diet and drink choices. -  Suggestion is made for her to avoid simple carbohydrates  from her diet including Cakes, Sweet Desserts / Pastries, Ice Cream, Soda (diet and regular), Sweet Tea, Candies, Chips, Cookies, Sweet Pastries,  Store Bought Juices, Alcohol in Excess of  1-2 drinks a day, Artificial Sweeteners, Coffee Creamer, and "Sugar-free" Products. This will help patient to have stable blood glucose profile and potentially avoid unintended weight gain.  - she will be scheduled with Jearld Fenton, RDN, CDE for diabetes education.  - I have approached her with the following individualized plan to manage  her diabetes and patient agrees:   -Based on her presentation with near target glycemic profile, she will not need prandial insulin for now.   -She is advised to lower her Lantus to 60 units nightly,  continue monitoring blood glucose twice a day-daily before breakfast and at bedtime.   - she is warned not to take insulin without proper monitoring per  orders.  - she is encouraged to call clinic for blood glucose levels less than 70 or above 200 mg /dl. - she is advised to continue continue Metformin 1000 mg p.o. twice daily, therapeutically suitable for patient .  - she will be considered for incretin therapy as appropriate next visit.  - Specific targets for  A1c;  LDL, HDL,  and Triglycerides were discussed with the patient.  2) Blood Pressure /Hypertension:  Her blood pressure is controlled to target. she is advised to continue her current medications including losartan 25 mg p.o. daily with breakfast . 3) Lipids/Hyperlipidemia: She does not have recent lipid panel to review.  She is advised to continue Crestor 10 mg daily at bedtime.    Side effects and precautions discussed with her.  4)  Weight/Diet:  Body mass index is 30.17 kg/m.  -   clearly complicating her diabetes care.   she is  a candidate for weight  loss. I discussed with her the fact that loss of 5 - 10% of her  current body weight will have the most impact on her diabetes management.  Exercise, and detailed carbohydrates information provided  -  detailed on discharge instructions.  5) Chronic Care/Health Maintenance:  -she  is on ACEI/ARB and Statin medications and  is encouraged to initiate and continue to follow up with Ophthalmology, Dentist,  Podiatrist at least yearly or according to recommendations, and advised to   stay away from smoking. I have recommended yearly flu vaccine and pneumonia vaccine at least every 5 years; moderate intensity exercise for up to 150 minutes weekly; and  sleep for at least 7 hours a day.  - she is  advised to maintain close follow up with Rosita Fire, MD for primary care needs, as well as her other providers for optimal and coordinated care.   - Time spent on this patient care encounter:  35 min, of which > 50% was spent in  counseling and the rest reviewing her blood glucose logs , discussing her hypoglycemia and hyperglycemia  episodes, reviewing her current and  previous labs / studies  ( including abstraction from other facilities) and medications  doses and developing a  long term treatment plan and documenting her care.   Please refer to Patient Instructions for Blood Glucose Monitoring and Insulin/Medications Dosing Guide"  in media tab for additional information. Please  also refer to " Patient Self Inventory" in the Media  tab for reviewed elements of pertinent patient history.  Megan Mcknight participated in the discussions, expressed understanding, and voiced agreement with the above plans.  All questions were answered to her satisfaction. she is encouraged to contact clinic should she have any questions or concerns prior to her return visit.  Follow up plan: - Return in about 3 months (around 08/29/2020) for Bring Meter and Logs- A1c in Office.  Glade Lloyd, MD [facility] [address] [place], [place] [address] Phone: [phone]  Fax: [phone]    05/29/2020, 6:53 PM  This note was partially dictated with voice recognition software. Similar sounding words can be transcribed inadequately or may not  be corrected upon review.

## 2020-06-08 LAB — ACID FAST CULTURE WITH REFLEXED SENSITIVITIES (MYCOBACTERIA): Acid Fast Culture: NEGATIVE

## 2020-06-14 DIAGNOSIS — C8461 Anaplastic large cell lymphoma, ALK-positive, lymph nodes of head, face, and neck: Secondary | ICD-10-CM | POA: Diagnosis not present

## 2020-06-19 DIAGNOSIS — C8460 Anaplastic large cell lymphoma, ALK-positive, unspecified site: Secondary | ICD-10-CM | POA: Diagnosis not present

## 2020-06-21 DIAGNOSIS — R590 Localized enlarged lymph nodes: Secondary | ICD-10-CM | POA: Diagnosis not present

## 2020-06-21 DIAGNOSIS — C8468 Anaplastic large cell lymphoma, ALK-positive, lymph nodes of multiple sites: Secondary | ICD-10-CM | POA: Diagnosis not present

## 2020-06-21 DIAGNOSIS — C8460 Anaplastic large cell lymphoma, ALK-positive, unspecified site: Secondary | ICD-10-CM | POA: Diagnosis not present

## 2020-06-25 DIAGNOSIS — C8460 Anaplastic large cell lymphoma, ALK-positive, unspecified site: Secondary | ICD-10-CM | POA: Diagnosis not present

## 2020-06-25 DIAGNOSIS — C8461 Anaplastic large cell lymphoma, ALK-positive, lymph nodes of head, face, and neck: Secondary | ICD-10-CM | POA: Diagnosis not present

## 2020-07-05 DIAGNOSIS — C8460 Anaplastic large cell lymphoma, ALK-positive, unspecified site: Secondary | ICD-10-CM | POA: Diagnosis not present

## 2020-07-05 DIAGNOSIS — Z6831 Body mass index (BMI) 31.0-31.9, adult: Secondary | ICD-10-CM | POA: Diagnosis not present

## 2020-07-05 DIAGNOSIS — I10 Essential (primary) hypertension: Secondary | ICD-10-CM | POA: Diagnosis not present

## 2020-07-05 DIAGNOSIS — E119 Type 2 diabetes mellitus without complications: Secondary | ICD-10-CM | POA: Diagnosis not present

## 2020-07-05 DIAGNOSIS — Z006 Encounter for examination for normal comparison and control in clinical research program: Secondary | ICD-10-CM | POA: Diagnosis not present

## 2020-07-05 DIAGNOSIS — Z7984 Long term (current) use of oral hypoglycemic drugs: Secondary | ICD-10-CM | POA: Diagnosis not present

## 2020-07-05 DIAGNOSIS — Z79899 Other long term (current) drug therapy: Secondary | ICD-10-CM | POA: Diagnosis not present

## 2020-07-05 DIAGNOSIS — C8590 Non-Hodgkin lymphoma, unspecified, unspecified site: Secondary | ICD-10-CM | POA: Diagnosis not present

## 2020-07-06 DIAGNOSIS — C8460 Anaplastic large cell lymphoma, ALK-positive, unspecified site: Secondary | ICD-10-CM | POA: Diagnosis not present

## 2020-07-06 DIAGNOSIS — C8590 Non-Hodgkin lymphoma, unspecified, unspecified site: Secondary | ICD-10-CM | POA: Diagnosis not present

## 2020-07-09 DIAGNOSIS — Z95828 Presence of other vascular implants and grafts: Secondary | ICD-10-CM | POA: Diagnosis not present

## 2020-07-09 DIAGNOSIS — E785 Hyperlipidemia, unspecified: Secondary | ICD-10-CM | POA: Diagnosis present

## 2020-07-09 DIAGNOSIS — E1165 Type 2 diabetes mellitus with hyperglycemia: Secondary | ICD-10-CM | POA: Diagnosis present

## 2020-07-09 DIAGNOSIS — Z9484 Stem cells transplant status: Secondary | ICD-10-CM | POA: Diagnosis not present

## 2020-07-09 DIAGNOSIS — Z5111 Encounter for antineoplastic chemotherapy: Secondary | ICD-10-CM | POA: Diagnosis not present

## 2020-07-09 DIAGNOSIS — D6481 Anemia due to antineoplastic chemotherapy: Secondary | ICD-10-CM | POA: Diagnosis not present

## 2020-07-09 DIAGNOSIS — I10 Essential (primary) hypertension: Secondary | ICD-10-CM | POA: Diagnosis present

## 2020-07-09 DIAGNOSIS — K219 Gastro-esophageal reflux disease without esophagitis: Secondary | ICD-10-CM | POA: Diagnosis present

## 2020-07-09 DIAGNOSIS — C8460 Anaplastic large cell lymphoma, ALK-positive, unspecified site: Secondary | ICD-10-CM | POA: Diagnosis present

## 2020-07-09 DIAGNOSIS — L89152 Pressure ulcer of sacral region, stage 2: Secondary | ICD-10-CM | POA: Diagnosis not present

## 2020-07-09 DIAGNOSIS — N179 Acute kidney failure, unspecified: Secondary | ICD-10-CM | POA: Diagnosis present

## 2020-07-09 DIAGNOSIS — D630 Anemia in neoplastic disease: Secondary | ICD-10-CM | POA: Diagnosis not present

## 2020-07-09 DIAGNOSIS — E119 Type 2 diabetes mellitus without complications: Secondary | ICD-10-CM | POA: Diagnosis not present

## 2020-07-09 DIAGNOSIS — I4891 Unspecified atrial fibrillation: Secondary | ICD-10-CM | POA: Diagnosis present

## 2020-07-09 DIAGNOSIS — R160 Hepatomegaly, not elsewhere classified: Secondary | ICD-10-CM | POA: Diagnosis present

## 2020-07-12 DIAGNOSIS — Z7689 Persons encountering health services in other specified circumstances: Secondary | ICD-10-CM | POA: Diagnosis not present

## 2020-07-12 DIAGNOSIS — C8468 Anaplastic large cell lymphoma, ALK-positive, lymph nodes of multiple sites: Secondary | ICD-10-CM | POA: Diagnosis not present

## 2020-07-13 IMAGING — CT CT ABD-PELV W/ CM
2 of 5 series · 16 of 46 positions shown, 18 images · IV contrast (APPLIED)
Comparison: [DATE] [DATE], [DATE]. [DATE] [DATE], [DATE].

CLINICAL DATA: Fever.

EXAM:
CT ABDOMEN AND PELVIS WITH CONTRAST
TECHNIQUE: Multidetector CT imaging of the abdomen and pelvis was performed
using the standard protocol following bolus administration of
intravenous contrast.
CONTRAST:  100mL OMNIPAQUE IOHEXOL 300 MG/ML  SOLN

[Series 3: abdomen 5.0 · axial · 0.86mm/px · z∈[-676,-271]mm · 13 of 95 slices shown, 15 images]
[im 7/95  soft-tissue]
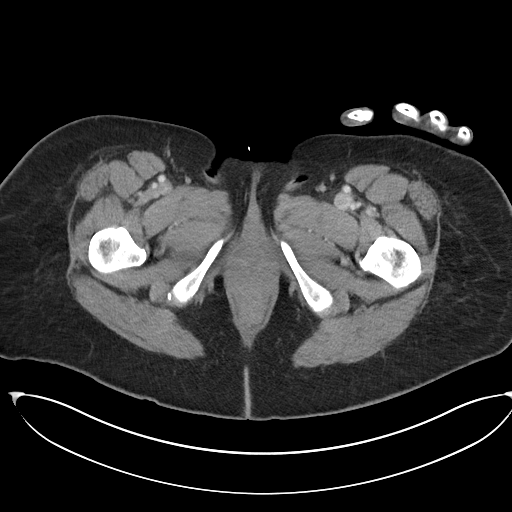
[im 7/95  bone]
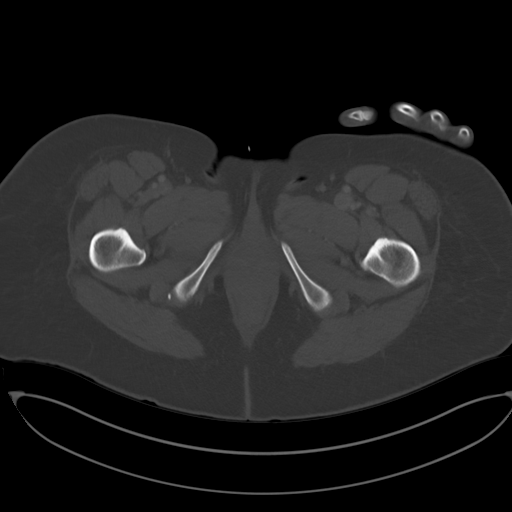
[im 14/95  soft-tissue]
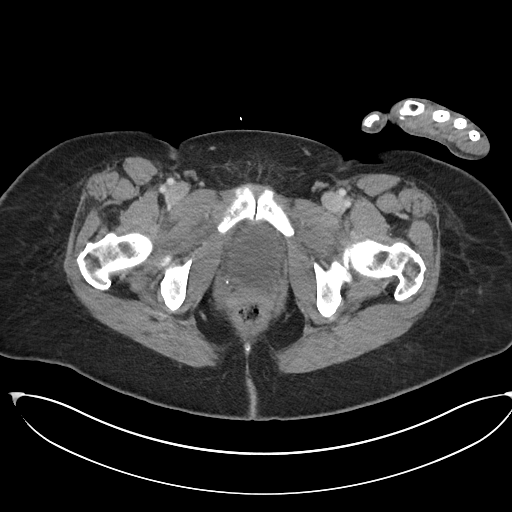
[im 21/95  soft-tissue]
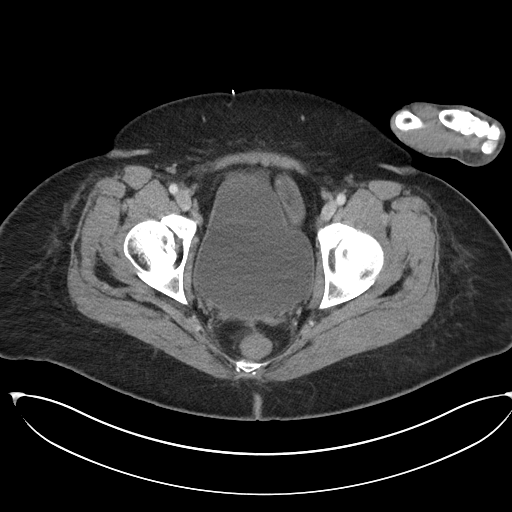
[im 27/95  soft-tissue]
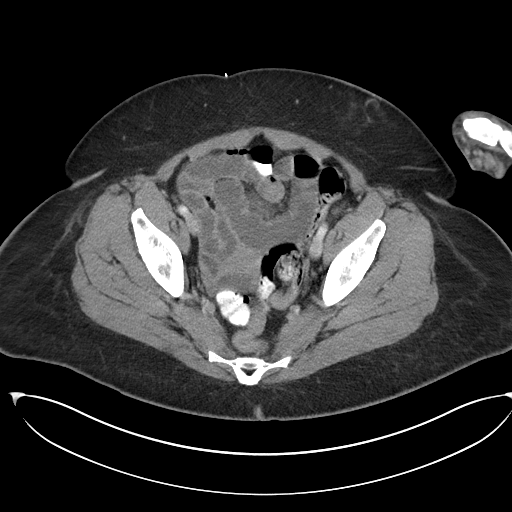
[im 34/95  soft-tissue]
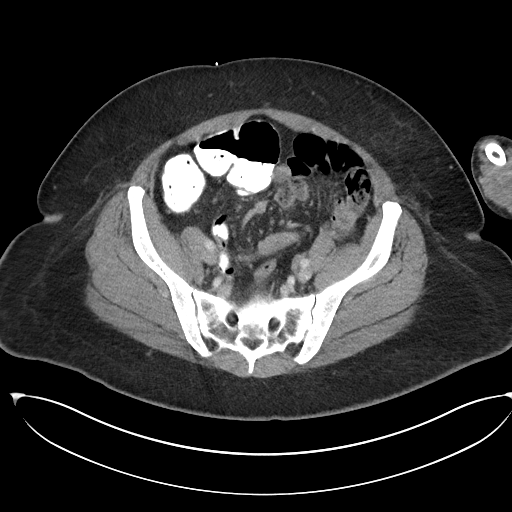
[im 41/95  soft-tissue]
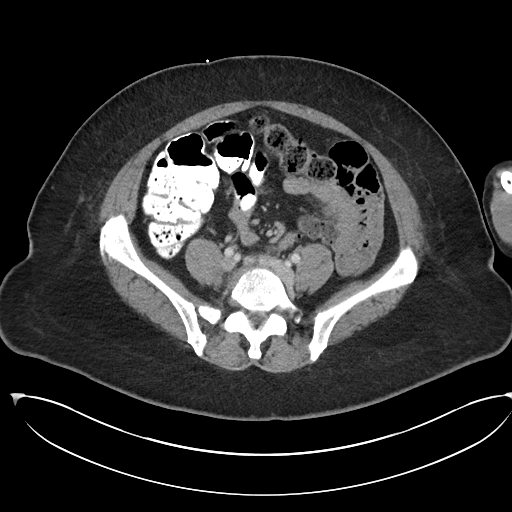
[im 48/95  soft-tissue]
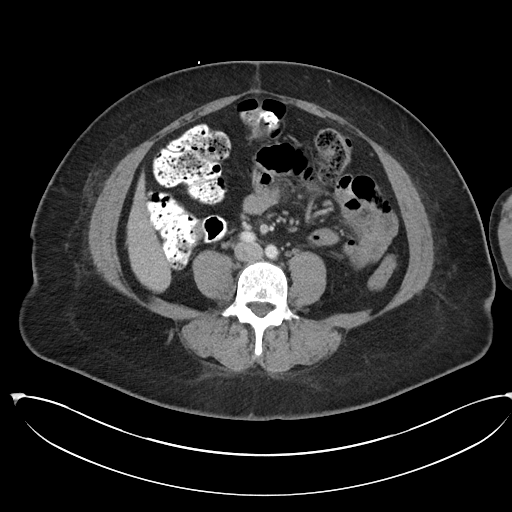
[im 54/95  soft-tissue]
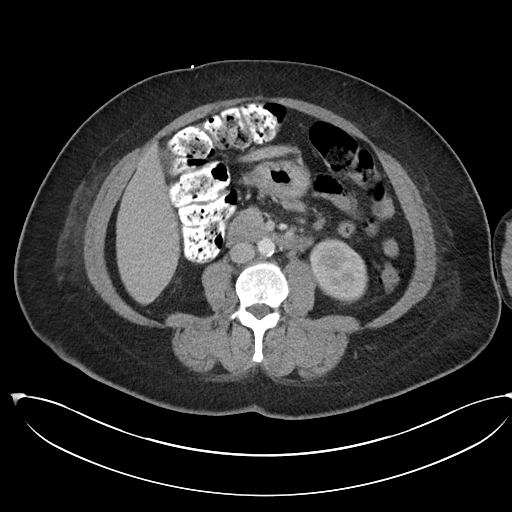
[im 61/95  soft-tissue]
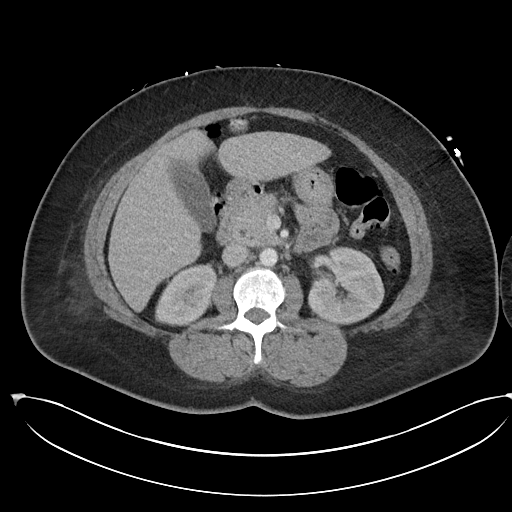
[im 61/95  bone]
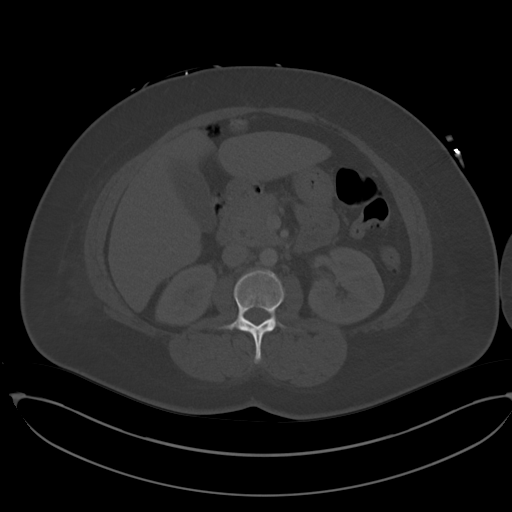
[im 68/95  soft-tissue]
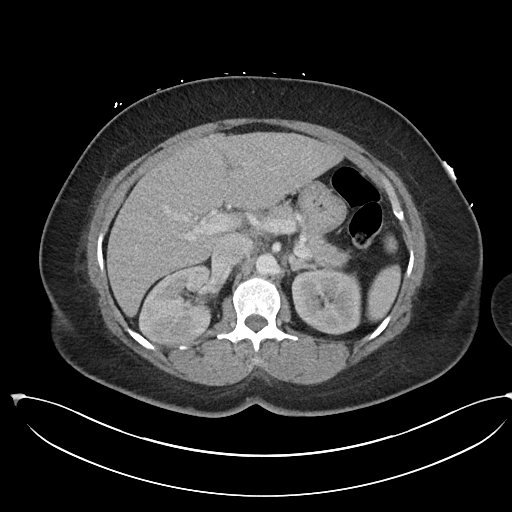
[im 74/95  soft-tissue]
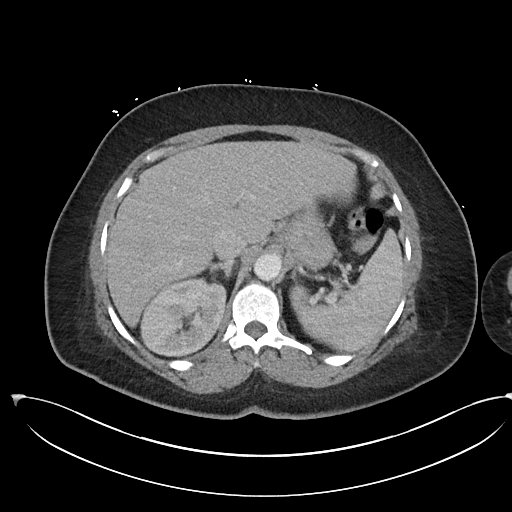
[im 81/95  soft-tissue]
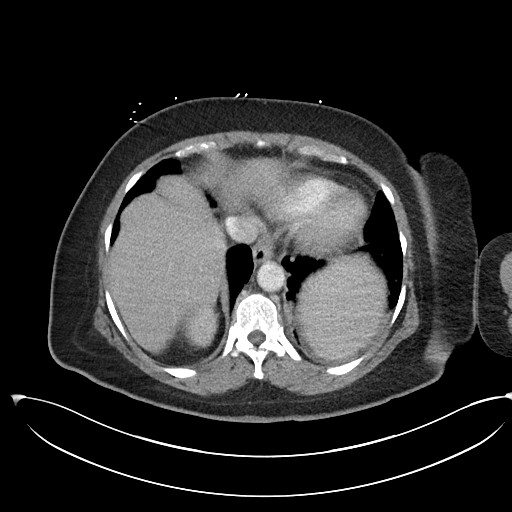
[im 88/95  soft-tissue]
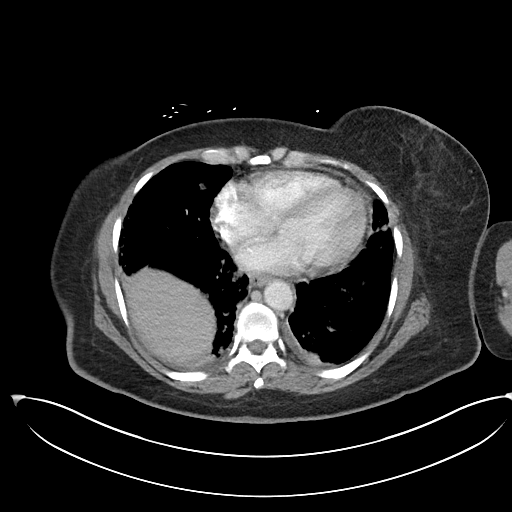

[Series 6: abdomen 3.0 mpr cor · coronal · 0.94mm/px · 3 of 107 slices shown]
[im 36/107  soft-tissue]
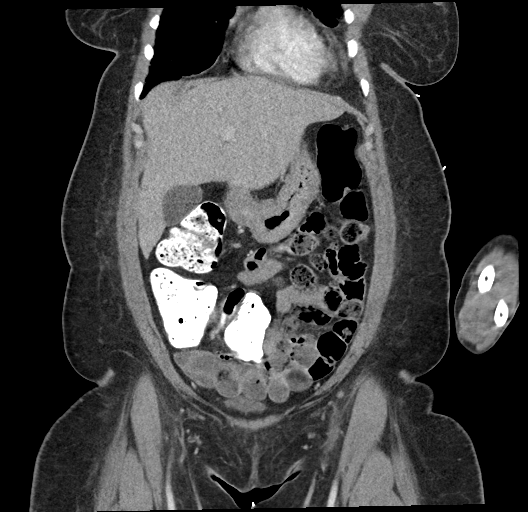
[im 48/107  soft-tissue]
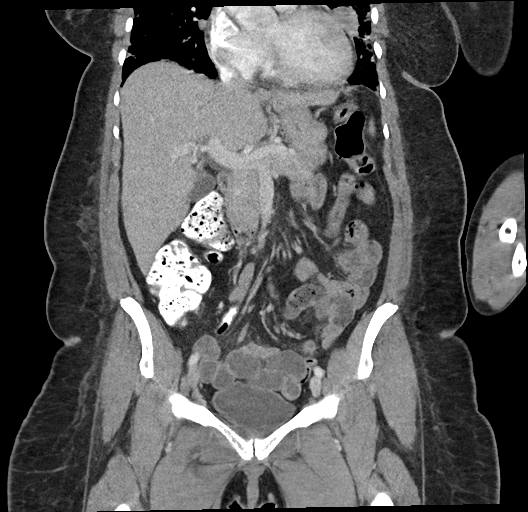
[im 59/107  soft-tissue]
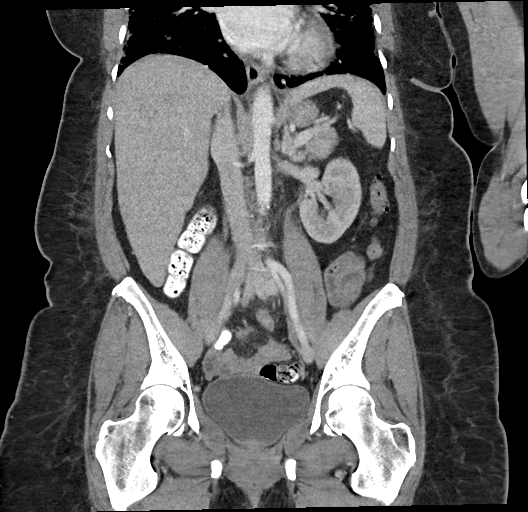

[16 of 46 positions shown; findings below may reference images not displayed]

FINDINGS: Lower chest: Multiple pulmonary nodules are noted in the visualized
lung bases consistent with metastatic disease. Mild right basilar
atelectasis is noted. Small bilateral pleural effusions are noted.

Hepatobiliary: No focal liver abnormality is seen. No gallstones,
gallbladder wall thickening, or biliary dilatation.

Pancreas: Unremarkable. No pancreatic ductal dilatation or
surrounding inflammatory changes.

Spleen: Normal in size without focal abnormality.

Adrenals/Urinary Tract: Adrenal glands are unremarkable. Kidneys are
normal, without renal calculi, focal lesion, or hydronephrosis.
Bladder is unremarkable.

Stomach/Bowel: Stomach is within normal limits. Appendix appears
normal. No evidence of bowel wall thickening, distention, or
inflammatory changes.

Vascular/Lymphatic: No significant vascular findings are present. No
enlarged abdominal or pelvic lymph nodes.

Reproductive: Status post hysterectomy. No adnexal masses.

Other: No abdominal wall hernia or abnormality. No abdominopelvic
ascites.

Musculoskeletal: No acute or significant osseous findings.
IMPRESSION: 1. Multiple pulmonary nodules are noted in the visualized lung bases
consistent with metastatic disease.
2. Small bilateral pleural effusions are noted.
3. No other abnormality seen in the abdomen or pelvis.

## 2020-07-16 DIAGNOSIS — C8460 Anaplastic large cell lymphoma, ALK-positive, unspecified site: Secondary | ICD-10-CM | POA: Diagnosis not present

## 2020-07-19 DIAGNOSIS — C8460 Anaplastic large cell lymphoma, ALK-positive, unspecified site: Secondary | ICD-10-CM | POA: Diagnosis not present

## 2020-07-23 DIAGNOSIS — C8460 Anaplastic large cell lymphoma, ALK-positive, unspecified site: Secondary | ICD-10-CM | POA: Diagnosis not present

## 2020-07-26 DIAGNOSIS — C8460 Anaplastic large cell lymphoma, ALK-positive, unspecified site: Secondary | ICD-10-CM | POA: Diagnosis not present

## 2020-07-27 ENCOUNTER — Telehealth: Payer: Self-pay | Admitting: Oncology

## 2020-07-27 NOTE — Telephone Encounter (Signed)
Cancelled 09/17 per provider request, patient has been called and notified.

## 2020-07-30 DIAGNOSIS — I10 Essential (primary) hypertension: Secondary | ICD-10-CM | POA: Diagnosis not present

## 2020-07-30 DIAGNOSIS — E785 Hyperlipidemia, unspecified: Secondary | ICD-10-CM | POA: Diagnosis not present

## 2020-07-30 DIAGNOSIS — Z5111 Encounter for antineoplastic chemotherapy: Secondary | ICD-10-CM | POA: Diagnosis not present

## 2020-07-30 DIAGNOSIS — Z95828 Presence of other vascular implants and grafts: Secondary | ICD-10-CM | POA: Diagnosis not present

## 2020-07-30 DIAGNOSIS — D6481 Anemia due to antineoplastic chemotherapy: Secondary | ICD-10-CM | POA: Diagnosis not present

## 2020-07-30 DIAGNOSIS — Z79899 Other long term (current) drug therapy: Secondary | ICD-10-CM | POA: Diagnosis not present

## 2020-07-30 DIAGNOSIS — Z794 Long term (current) use of insulin: Secondary | ICD-10-CM | POA: Diagnosis not present

## 2020-07-30 DIAGNOSIS — C8460 Anaplastic large cell lymphoma, ALK-positive, unspecified site: Secondary | ICD-10-CM | POA: Diagnosis not present

## 2020-07-30 DIAGNOSIS — T451X5A Adverse effect of antineoplastic and immunosuppressive drugs, initial encounter: Secondary | ICD-10-CM | POA: Diagnosis not present

## 2020-07-30 DIAGNOSIS — E119 Type 2 diabetes mellitus without complications: Secondary | ICD-10-CM | POA: Diagnosis not present

## 2020-07-30 DIAGNOSIS — Z87891 Personal history of nicotine dependence: Secondary | ICD-10-CM | POA: Diagnosis not present

## 2020-07-30 DIAGNOSIS — L89152 Pressure ulcer of sacral region, stage 2: Secondary | ICD-10-CM | POA: Diagnosis not present

## 2020-07-30 DIAGNOSIS — M8448XA Pathological fracture, other site, initial encounter for fracture: Secondary | ICD-10-CM | POA: Diagnosis not present

## 2020-07-30 DIAGNOSIS — D630 Anemia in neoplastic disease: Secondary | ICD-10-CM | POA: Diagnosis not present

## 2020-07-31 DIAGNOSIS — D6481 Anemia due to antineoplastic chemotherapy: Secondary | ICD-10-CM | POA: Diagnosis not present

## 2020-07-31 DIAGNOSIS — E785 Hyperlipidemia, unspecified: Secondary | ICD-10-CM | POA: Diagnosis not present

## 2020-07-31 DIAGNOSIS — Z79899 Other long term (current) drug therapy: Secondary | ICD-10-CM | POA: Diagnosis not present

## 2020-07-31 DIAGNOSIS — E119 Type 2 diabetes mellitus without complications: Secondary | ICD-10-CM | POA: Diagnosis not present

## 2020-07-31 DIAGNOSIS — I10 Essential (primary) hypertension: Secondary | ICD-10-CM | POA: Diagnosis not present

## 2020-07-31 DIAGNOSIS — Z5111 Encounter for antineoplastic chemotherapy: Secondary | ICD-10-CM | POA: Diagnosis not present

## 2020-07-31 DIAGNOSIS — C8460 Anaplastic large cell lymphoma, ALK-positive, unspecified site: Secondary | ICD-10-CM | POA: Diagnosis not present

## 2020-07-31 DIAGNOSIS — D630 Anemia in neoplastic disease: Secondary | ICD-10-CM | POA: Diagnosis not present

## 2020-07-31 DIAGNOSIS — Z95828 Presence of other vascular implants and grafts: Secondary | ICD-10-CM | POA: Diagnosis not present

## 2020-07-31 DIAGNOSIS — L89152 Pressure ulcer of sacral region, stage 2: Secondary | ICD-10-CM | POA: Diagnosis not present

## 2020-08-02 DIAGNOSIS — C8468 Anaplastic large cell lymphoma, ALK-positive, lymph nodes of multiple sites: Secondary | ICD-10-CM | POA: Diagnosis not present

## 2020-08-02 DIAGNOSIS — Z79899 Other long term (current) drug therapy: Secondary | ICD-10-CM | POA: Diagnosis not present

## 2020-08-02 DIAGNOSIS — Z7689 Persons encountering health services in other specified circumstances: Secondary | ICD-10-CM | POA: Diagnosis not present

## 2020-08-06 DIAGNOSIS — C8460 Anaplastic large cell lymphoma, ALK-positive, unspecified site: Secondary | ICD-10-CM | POA: Diagnosis not present

## 2020-08-09 DIAGNOSIS — C8460 Anaplastic large cell lymphoma, ALK-positive, unspecified site: Secondary | ICD-10-CM | POA: Diagnosis not present

## 2020-08-13 DIAGNOSIS — E1140 Type 2 diabetes mellitus with diabetic neuropathy, unspecified: Secondary | ICD-10-CM | POA: Diagnosis not present

## 2020-08-13 DIAGNOSIS — C8460 Anaplastic large cell lymphoma, ALK-positive, unspecified site: Secondary | ICD-10-CM | POA: Diagnosis not present

## 2020-08-13 DIAGNOSIS — B351 Tinea unguium: Secondary | ICD-10-CM | POA: Diagnosis not present

## 2020-08-13 DIAGNOSIS — M79674 Pain in right toe(s): Secondary | ICD-10-CM | POA: Diagnosis not present

## 2020-08-13 DIAGNOSIS — M79675 Pain in left toe(s): Secondary | ICD-10-CM | POA: Diagnosis not present

## 2020-08-16 DIAGNOSIS — C8460 Anaplastic large cell lymphoma, ALK-positive, unspecified site: Secondary | ICD-10-CM | POA: Diagnosis not present

## 2020-08-21 DIAGNOSIS — C8468 Anaplastic large cell lymphoma, ALK-positive, lymph nodes of multiple sites: Secondary | ICD-10-CM | POA: Diagnosis not present

## 2020-08-22 DIAGNOSIS — C8190 Hodgkin lymphoma, unspecified, unspecified site: Secondary | ICD-10-CM | POA: Diagnosis not present

## 2020-08-27 DIAGNOSIS — I10 Essential (primary) hypertension: Secondary | ICD-10-CM | POA: Diagnosis present

## 2020-08-27 DIAGNOSIS — Z794 Long term (current) use of insulin: Secondary | ICD-10-CM | POA: Diagnosis not present

## 2020-08-27 DIAGNOSIS — J302 Other seasonal allergic rhinitis: Secondary | ICD-10-CM | POA: Diagnosis present

## 2020-08-27 DIAGNOSIS — Z90710 Acquired absence of both cervix and uterus: Secondary | ICD-10-CM | POA: Diagnosis not present

## 2020-08-27 DIAGNOSIS — Z9484 Stem cells transplant status: Secondary | ICD-10-CM | POA: Diagnosis not present

## 2020-08-27 DIAGNOSIS — Z888 Allergy status to other drugs, medicaments and biological substances status: Secondary | ICD-10-CM | POA: Diagnosis not present

## 2020-08-27 DIAGNOSIS — E119 Type 2 diabetes mellitus without complications: Secondary | ICD-10-CM | POA: Diagnosis present

## 2020-08-27 DIAGNOSIS — E785 Hyperlipidemia, unspecified: Secondary | ICD-10-CM | POA: Diagnosis present

## 2020-08-27 DIAGNOSIS — C8460 Anaplastic large cell lymphoma, ALK-positive, unspecified site: Secondary | ICD-10-CM | POA: Diagnosis present

## 2020-08-27 DIAGNOSIS — Z79899 Other long term (current) drug therapy: Secondary | ICD-10-CM | POA: Diagnosis not present

## 2020-08-27 DIAGNOSIS — Z91013 Allergy to seafood: Secondary | ICD-10-CM | POA: Diagnosis not present

## 2020-08-27 DIAGNOSIS — Z959 Presence of cardiac and vascular implant and graft, unspecified: Secondary | ICD-10-CM | POA: Diagnosis not present

## 2020-08-27 DIAGNOSIS — Z5111 Encounter for antineoplastic chemotherapy: Secondary | ICD-10-CM | POA: Diagnosis not present

## 2020-08-27 DIAGNOSIS — D630 Anemia in neoplastic disease: Secondary | ICD-10-CM | POA: Diagnosis present

## 2020-08-27 DIAGNOSIS — Z8042 Family history of malignant neoplasm of prostate: Secondary | ICD-10-CM | POA: Diagnosis not present

## 2020-08-27 DIAGNOSIS — Z87891 Personal history of nicotine dependence: Secondary | ICD-10-CM | POA: Diagnosis not present

## 2020-08-27 DIAGNOSIS — L89152 Pressure ulcer of sacral region, stage 2: Secondary | ICD-10-CM | POA: Diagnosis present

## 2020-08-29 ENCOUNTER — Other Ambulatory Visit: Payer: Self-pay

## 2020-08-29 ENCOUNTER — Ambulatory Visit (INDEPENDENT_AMBULATORY_CARE_PROVIDER_SITE_OTHER): Payer: Medicare Other | Admitting: "Endocrinology

## 2020-08-29 ENCOUNTER — Encounter: Payer: Self-pay | Admitting: "Endocrinology

## 2020-08-29 ENCOUNTER — Other Ambulatory Visit (HOSPITAL_COMMUNITY): Payer: Self-pay | Admitting: Internal Medicine

## 2020-08-29 VITALS — BP 120/74 | HR 72 | Ht 68.0 in | Wt 229.8 lb

## 2020-08-29 DIAGNOSIS — E782 Mixed hyperlipidemia: Secondary | ICD-10-CM

## 2020-08-29 DIAGNOSIS — I10 Essential (primary) hypertension: Secondary | ICD-10-CM | POA: Diagnosis not present

## 2020-08-29 DIAGNOSIS — E1165 Type 2 diabetes mellitus with hyperglycemia: Secondary | ICD-10-CM | POA: Diagnosis not present

## 2020-08-29 DIAGNOSIS — Z1231 Encounter for screening mammogram for malignant neoplasm of breast: Secondary | ICD-10-CM

## 2020-08-29 NOTE — Progress Notes (Signed)
08/29/2020, 1:12 PM  Endocrinology follow-up note   Subjective:    Patient ID: Megan Mcknight, female    DOB: 1961/09/20.  Megan Mcknight is being seen in follow-up after she was seen in consultation for management of currently uncontrolled symptomatic diabetes requested by  Rosita Fire, MD.   Past Medical History:  Diagnosis Date  . Acute bronchitis   . Anaplastic large cell lymphoma 2014   remission since 2015  . Asthma   . Cancer ([place])    lung and kidney  . Colon polyps 02/2013   Per colonoscopy  . Coma ([place])   . Diabetes mellitus without complication ([place])   . Diverticulosis 02/2013   Per colonoscopy  . Dysrhythmia   . Former light tobacco smoker   . GERD (gastroesophageal reflux disease)   . H/O stem cell transplant ([place]) 2015  . Hyperlipidemia   . Nerve pain    Left leg  . Non Hodgkin's lymphoma ([place])   . Seasonal allergies     Past Surgical History:  Procedure Laterality Date  . bones removed from Baby toes    . BRONCHIAL BRUSHINGS  04/25/2020   Procedure: BRONCHIAL BRUSHINGS;  Surgeon: Rigoberto Noel, MD;  Location: [facility];  Service: Cardiopulmonary;;  . BRONCHIAL WASHINGS  04/25/2020   Procedure: BRONCHIAL WASHINGS;  Surgeon: Rigoberto Noel, MD;  Location: [facility];  Service: Cardiopulmonary;;  . COLONOSCOPY  02/16/2012   Procedure: COLONOSCOPY;  Surgeon: Daneil Dolin, MD;  [facility];  Service: Endoscopy;  Laterality: N/A;  1:30  . COLONOSCOPY N/A 03/26/2017   Procedure: COLONOSCOPY;  Surgeon: Daneil Dolin, MD;  [facility];  Service: Endoscopy;  Laterality: N/A;  930  . IR IMAGING GUIDED PORT INSERTION  12/19/2019  . LUNG BIOPSY  04/25/2020   Procedure: LUNG BIOPSY;  Surgeon: Rigoberto Noel, MD;  Location: [facility];  Service: Cardiopulmonary;;  . MEDIASTINOSCOPY N/A 05/13/2013   Procedure: MEDIASTINOSCOPY;  Surgeon: Melrose Nakayama, MD;  Location: [facility];   Service: Thoracic;  Laterality: N/A;  . PARTIAL HYSTERECTOMY    . VAGINAL HYSTERECTOMY     partial hyst   . VIDEO BRONCHOSCOPY N/A 04/25/2020   Procedure: VIDEO BRONCHOSCOPY WITH FLUORO;  Surgeon: Rigoberto Noel, MD;  Location: [place];  Service: Cardiopulmonary;  Laterality: N/A;  . VIDEO BRONCHOSCOPY WITH ENDOBRONCHIAL ULTRASOUND N/A 05/13/2013   Procedure: VIDEO BRONCHOSCOPY WITH ENDOBRONCHIAL ULTRASOUND;  Surgeon: Melrose Nakayama, MD;  Location: [facility];  Service: Thoracic;  Laterality: N/A;    Social History   Socioeconomic History  . Marital status: Divorced    Spouse name: Not on file  . Number of children: 3  . Years of education: Not on file  . Highest education level: Not on file  Occupational History  . Occupation: Lobbyist: Physicist, medical  Tobacco Use  . Smoking status: Former Smoker    Packs/day: 0.50    Types: Cigarettes    Quit date: 04/19/2013    Years since quitting: 7.3  . Smokeless tobacco: Never Used  Vaping Use  . Vaping Use: Never used  Substance and Sexual Activity  . Alcohol use: No  . Drug use: No  . Sexual activity:  Yes    Birth control/protection: Surgical  Other Topics Concern  . Not on file  Social History Narrative  . Not on file   Social Determinants of Health   Financial Resource Strain:   . Difficulty of Paying Living Expenses: Not on file  Food Insecurity:   . Worried About Charity fundraiser in the Last Year: Not on file  . Ran Out of Food in the Last Year: Not on file  Transportation Needs:   . Lack of Transportation (Medical): Not on file  . Lack of Transportation (Non-Medical): Not on file  Physical Activity:   . Days of Exercise per Week: Not on file  . Minutes of Exercise per Session: Not on file  Stress:   . Feeling of Stress : Not on file  Social Connections:   . Frequency of Communication with Friends and Family: Not on file  . Frequency of Social Gatherings with Friends and Family: Not on file   . Attends Religious Services: Not on file  . Active Member of Clubs or Organizations: Not on file  . Attends Archivist Meetings: Not on file  . Marital Status: Not on file    Family History  Problem Relation Age of Onset  . Colon polyps Mother   . Hypertension Mother   . Heart disease Mother   . Hyperlipidemia Mother   . Cancer Father        prostate  . Hypertension Father   . Hyperlipidemia Father   . Heart defect Other        family history   . Asthma Other        family history     Outpatient Encounter Medications as of 08/29/2020  Medication Sig  . ACCU-CHEK GUIDE test strip USE AS INSTRUCTED  . albuterol (PROVENTIL HFA;VENTOLIN HFA) 108 (90 Base) MCG/ACT inhaler Inhale 2 puffs into the lungs every 4 (four) hours as needed for wheezing or shortness of breath.  . blood glucose meter kit and supplies KIT 1 each by Does not apply route 4 (four) times daily. Dispense based on patient and insurance preference. Use up to four times daily as directed. (FOR ICD-9 250.00, 250.01). (Patient not taking: Reported on 04/21/2020)  . Blood Glucose Monitoring Suppl (ACCU-CHEK GUIDE) w/Device KIT 1 Piece by Does not apply route as directed. (Patient not taking: Reported on 04/21/2020)  . Insulin Pen Needle (B-D ULTRAFINE III SHORT PEN) 31G X 8 MM MISC 1 each by Does not apply route as directed. (Patient not taking: Reported on 04/21/2020)  . latanoprost (XALATAN) 0.005 % ophthalmic solution 1 drop at bedtime.  . metFORMIN (GLUCOPHAGE) 500 MG tablet Take by mouth 2 (two) times daily with a meal.  . metoprolol tartrate (LOPRESSOR) 25 MG tablet Take 25 mg by mouth 2 (two) times daily.   . montelukast (SINGULAIR) 10 MG tablet Take 10 mg by mouth at bedtime.  . rosuvastatin (CRESTOR) 10 MG tablet Take 10 mg by mouth daily.  . [DISCONTINUED] amLODipine (NORVASC) 2.5 MG tablet Take 2.5 mg by mouth daily.   . [DISCONTINUED] ammonium lactate (AMLACTIN) 12 % cream APPLY TO AFFECTED AREA ONCE A DAY  FOR 90 DAYS  . [DISCONTINUED] LANTUS SOLOSTAR 100 UNIT/ML Solostar Pen Inject 60 Units into the skin at bedtime.  . [DISCONTINUED] lidocaine-prilocaine (EMLA) cream Apply 1 application topically as needed.  . [DISCONTINUED] losartan (COZAAR) 25 MG tablet Take 25 mg by mouth daily.   . [DISCONTINUED] prochlorperazine (COMPAZINE) 10 MG tablet Take 1  tablet (10 mg total) by mouth every 6 (six) hours as needed for nausea or vomiting.   No facility-administered encounter medications on file as of 08/29/2020.    ALLERGIES: Allergies  Allergen Reactions  . Iodine Anaphylaxis    Told to avoid due to shellfish allergy. Told to avoid due to shellfish allergy.  Marland Kitchen Shellfish-Derived Products Anaphylaxis and Swelling    Eyes swelling, scratchy throat, bumps on lips.    VACCINATION STATUS: Immunization History  Administered Date(s) Administered  . DTaP / Hep B / IPV 10/04/2014  . HiB (PRP-T) 10/04/2014  . Influenza,inj,Quad PF,6+ Mos 11/04/2013, 10/04/2014  . Influenza-Unspecified 09/14/2018  . Moderna SARS-COVID-2 Vaccination 03/08/2020, 04/10/2020  . Pneumococcal Conjugate-13 10/04/2014    Diabetes She presents for her follow-up diabetic visit. She has type 2 diabetes mellitus. Onset time: She was diagnosed at approximate age of 39 years. Her disease course has been improving. There are no hypoglycemic associated symptoms. Pertinent negatives for hypoglycemia include no confusion, headaches, pallor or seizures. Pertinent negatives for diabetes include no chest pain, no fatigue, no polydipsia, no polyphagia and no polyuria. There are no hypoglycemic complications. Symptoms are improving. There are no diabetic complications. Risk factors for coronary artery disease include diabetes mellitus, dyslipidemia, family history, obesity, hypertension, tobacco exposure, sedentary lifestyle and post-menopausal. Current diabetic treatment includes insulin injections (She is currently on Lantus 60 units nightly,  Metformin 1000 mg p.o. twice daily.). Her weight is increasing steadily. She is following a generally unhealthy diet. When asked about meal planning, she reported none. She has not had a previous visit with a dietitian. She rarely participates in exercise. Her home blood glucose trend is decreasing steadily. Her breakfast blood glucose range is generally 90-110 mg/dl. Her bedtime blood glucose range is generally 110-130 mg/dl. (She has not used her Lantus for most of the days, only used it a handful of times.  Despite this, she presents with tightly controlled glycemic profile both fasting and postprandially.  Her point-of-care A1c is 5.4%, overall improving from 14%.  She has no major hypoglycemia reported or documented.     ) An ACE inhibitor/angiotensin II receptor blocker is being taken. Eye exam is current.  Hyperlipidemia This is a chronic problem. The current episode started more than 1 year ago. Exacerbating diseases include diabetes. Factors aggravating her hyperlipidemia include smoking. Pertinent negatives include no chest pain, myalgias or shortness of breath. Current antihyperlipidemic treatment includes statins. Risk factors for coronary artery disease include dyslipidemia, diabetes mellitus, hypertension, obesity, a sedentary lifestyle, post-menopausal and family history.  Hypertension This is a chronic problem. The current episode started more than 1 year ago. Pertinent negatives include no chest pain, headaches, palpitations or shortness of breath. Risk factors for coronary artery disease include diabetes mellitus, dyslipidemia, family history, obesity, post-menopausal state, smoking/tobacco exposure and sedentary lifestyle. Past treatments include angiotensin blockers.    Review of Systems  Constitutional: Negative for chills, fatigue, fever and unexpected weight change.  HENT: Negative for trouble swallowing and voice change.   Eyes: Negative for visual disturbance.  Respiratory:  Negative for cough, shortness of breath and wheezing.   Cardiovascular: Negative for chest pain, palpitations and leg swelling.  Gastrointestinal: Negative for diarrhea, nausea and vomiting.  Endocrine: Negative for cold intolerance, heat intolerance, polydipsia, polyphagia and polyuria.  Musculoskeletal: Negative for arthralgias and myalgias.  Skin: Negative for color change, pallor, rash and wound.  Neurological: Negative for seizures and headaches.  Psychiatric/Behavioral: Negative for confusion and suicidal ideas.    Objective:  Vitals with BMI 08/29/2020 05/29/2020 05/04/2020  Height 5' 8"  5' 8"  -  Weight 229 lbs 13 oz 198 lbs 6 oz -  BMI 04.59 97.74 -  Systolic 142 395 320  Diastolic 74 74 80  Pulse 72 91 92    BP 120/74   Pulse 72   Ht 5' 8"  (1.727 m)   Wt 229 lb 12.8 oz (104.2 kg)   BMI 34.94 kg/m   Wt Readings from Last 3 Encounters:  08/29/20 229 lb 12.8 oz (104.2 kg)  05/29/20 198 lb 6.4 oz (90 kg)  04/30/20 211 lb 10.3 oz (96 kg)     Physical Exam Constitutional:      Appearance: She is well-developed.  HENT:     Head: Normocephalic and atraumatic.  Neck:     Thyroid: No thyromegaly.     Trachea: No tracheal deviation.  Cardiovascular:     Rate and Rhythm: Normal rate and regular rhythm.  Pulmonary:     Effort: Pulmonary effort is normal.  Abdominal:     Palpations: Abdomen is soft.     Tenderness: There is no abdominal tenderness. There is no guarding.  Musculoskeletal:        General: Normal range of motion.     Cervical back: Normal range of motion and neck supple.  Skin:    General: Skin is warm and dry.     Coloration: Skin is not pale.     Findings: No erythema or rash.  Neurological:     Mental Status: She is alert and oriented to person, place, and time.     Cranial Nerves: No cranial nerve deficit.     Coordination: Coordination normal.     Deep Tendon Reflexes: Reflexes are normal and symmetric.  Psychiatric:        Judgment: Judgment  normal.       CMP ( most recent) CMP     Component Value Date/Time   NA 141 05/04/2020 0414   NA 144 09/04/2017 0916   K 3.6 05/04/2020 0414   K 4.1 09/04/2017 0916   CL 104 05/04/2020 0414   CO2 26 05/04/2020 0414   CO2 26 09/04/2017 0916   GLUCOSE 280 (H) 05/04/2020 0414   GLUCOSE 197 (H) 09/04/2017 0916   BUN 20 05/04/2020 0414   BUN 15.6 09/04/2017 0916   CREATININE 0.61 05/04/2020 0414   CREATININE 0.84 04/05/2020 0900   CREATININE 1.0 09/04/2017 0916   CALCIUM 8.8 (L) 05/04/2020 0414   CALCIUM 9.5 09/04/2017 0916   PROT 6.5 05/01/2020 0426   PROT 6.8 09/04/2017 0916   ALBUMIN 1.9 (L) 05/01/2020 0426   ALBUMIN 3.8 09/04/2017 0916   AST 62 (H) 05/01/2020 0426   AST 18 04/05/2020 0900   AST 19 09/04/2017 0916   ALT 88 (H) 05/01/2020 0426   ALT 14 04/05/2020 0900   ALT 18 09/04/2017 0916   ALKPHOS 68 05/01/2020 0426   ALKPHOS 49 09/04/2017 0916   BILITOT 0.5 05/01/2020 0426   BILITOT 0.3 04/05/2020 0900   BILITOT 0.49 09/04/2017 0916   GFRNONAA >60 05/04/2020 0414   GFRNONAA >60 04/05/2020 0900   GFRAA >60 05/04/2020 0414   GFRAA >60 04/05/2020 0900     Diabetic Labs (most recent): Lab Results  Component Value Date   HGBA1C 7.4 (H) 04/22/2020   HGBA1C 9.9 (A) 02/20/2020   HGBA1C 12.4 (H) 08/23/2013    Lab Results  Component Value Date   TSH 2.859 05/06/2013   FREET4 0.93 05/06/2013  Assessment & Plan:   1. Type 2 diabetes mellitus with hyperglycemia, without long-term current use of insulin (HCC)  - Megan Mcknight has currently uncontrolled symptomatic type 2 DM since  59 years of age. She has not used her Lantus for most of the days, only used it a handful of times.  Despite this, she presents with tightly controlled glycemic profile both fasting and postprandially.  Her point-of-care A1c is 5.4%, overall improving from 14%.  She has no major hypoglycemia reported or documented.    -- Recent labs reviewed.  Her diabetes is recently exacerbated  by concurrent use of steroids during treatment with chemo for lymphoma. - I had a long discussion with her about the progressive nature of diabetes and the pathology behind its complications. -her diabetes is complicated by obesity/sedentary life and she remains at a high risk for more acute and chronic complications which include CAD, CVA, CKD, retinopathy, and neuropathy. These are all discussed in detail with her.  - I have counseled her on diet  and weight management  by adopting a carbohydrate restricted/protein rich diet. Patient is encouraged to switch to  unprocessed or minimally processed     complex starch and increased protein intake (animal or plant source), fruits, and vegetables. -  she is advised to stick to a routine mealtimes to eat 3 meals  a day and avoid unnecessary snacks ( to snack only to correct hypoglycemia).   - she  admits there is a room for improvement in her diet and drink choices. -  Suggestion is made for her to avoid simple carbohydrates  from her diet including Cakes, Sweet Desserts / Pastries, Ice Cream, Soda (diet and regular), Sweet Tea, Candies, Chips, Cookies, Sweet Pastries,  Store Bought Juices, Alcohol in Excess of  1-2 drinks a day, Artificial Sweeteners, Coffee Creamer, and "Sugar-free" Products. This will help patient to have stable blood glucose profile and potentially avoid unintended weight gain.   - she will be scheduled with Jearld Fenton, RDN, CDE for diabetes education.  - I have approached her with the following individualized plan to manage  her diabetes and patient agrees:   -Based on her presentation with tightly controlled glycemic profile, should be taken off of insulin for now.    -She will be continued only on Metformin 500 mg p.o. twice daily, with monitoring of blood glucose as needed.  She is advised to monitor blood glucose more frequently if she is given steroid based chemotherapy.   - she is encouraged to call clinic for blood  glucose levels less than 70 or above 200 mg /dl. - she will be considered for incretin therapy as appropriate next visit.  - Specific targets for  A1c;  LDL, HDL,  and Triglycerides were discussed with the patient.  2) Blood Pressure /Hypertension:  Her blood pressure is controlled to target. she is advised to continue her current medications including losartan 25 mg p.o. daily with breakfast . 3) Lipids/Hyperlipidemia: She does not have recent lipid panel to review.  She is advised to continue Crestor 10 mg p.o. daily at bedtime.    Side effects and precautions discussed with her.  4)  Weight/Diet:  Body mass index is 34.94 kg/m.  -   clearly complicating her diabetes care.   she is  a candidate for weight loss. I discussed with her the fact that loss of 5 - 10% of her  current body weight will have the most impact on her diabetes management.  Exercise, and detailed carbohydrates information provided  -  detailed on discharge instructions.  5) Chronic Care/Health Maintenance:  -she  is on ACEI/ARB and Statin medications and  is encouraged to initiate and continue to follow up with Ophthalmology, Dentist,  Podiatrist at least yearly or according to recommendations, and advised to   stay away from smoking. I have recommended yearly flu vaccine and pneumonia vaccine at least every 5 years; moderate intensity exercise for up to 150 minutes weekly; and  sleep for at least 7 hours a day.  - she is  advised to maintain close follow up with Rosita Fire, MD for primary care needs, as well as her other providers for optimal and coordinated care.   - Time spent on this patient care encounter:  35 min, of which > 50% was spent in  counseling and the rest reviewing her blood glucose logs , discussing her hypoglycemia and hyperglycemia episodes, reviewing her current and  previous labs / studies  ( including abstraction from other facilities) and medications  doses and developing a  long term treatment plan  and documenting her care.   Please refer to Patient Instructions for Blood Glucose Monitoring and Insulin/Medications Dosing Guide"  in media tab for additional information. Please  also refer to " Patient Self Inventory" in the Media  tab for reviewed elements of pertinent patient history.  Megan Mcknight participated in the discussions, expressed understanding, and voiced agreement with the above plans.  All questions were answered to her satisfaction. she is encouraged to contact clinic should she have any questions or concerns prior to her return visit.   Follow up plan: - Return in about 3 months (around 11/28/2020) for F/U with Pre-visit Labs, Meter, Logs, A1c here.Glade Lloyd, MD [facility] [address] [place], [place] [address] Phone: [phone]  Fax: [phone]    08/29/2020, 1:11 PM  This note was partially dictated with voice recognition software. Similar sounding words can be transcribed inadequately or may not  be corrected upon review.

## 2020-08-29 NOTE — Patient Instructions (Signed)

## 2020-08-30 DIAGNOSIS — Z7689 Persons encountering health services in other specified circumstances: Secondary | ICD-10-CM | POA: Diagnosis not present

## 2020-08-30 DIAGNOSIS — C8460 Anaplastic large cell lymphoma, ALK-positive, unspecified site: Secondary | ICD-10-CM | POA: Diagnosis not present

## 2020-08-31 ENCOUNTER — Other Ambulatory Visit: Payer: BC Managed Care – PPO

## 2020-08-31 ENCOUNTER — Ambulatory Visit: Payer: BC Managed Care – PPO | Admitting: Oncology

## 2020-09-03 DIAGNOSIS — C8460 Anaplastic large cell lymphoma, ALK-positive, unspecified site: Secondary | ICD-10-CM | POA: Diagnosis not present

## 2020-09-06 DIAGNOSIS — C8460 Anaplastic large cell lymphoma, ALK-positive, unspecified site: Secondary | ICD-10-CM | POA: Diagnosis not present

## 2020-09-10 DIAGNOSIS — C8460 Anaplastic large cell lymphoma, ALK-positive, unspecified site: Secondary | ICD-10-CM | POA: Diagnosis not present

## 2020-09-13 DIAGNOSIS — C8460 Anaplastic large cell lymphoma, ALK-positive, unspecified site: Secondary | ICD-10-CM | POA: Diagnosis not present

## 2020-09-17 DIAGNOSIS — Z9484 Stem cells transplant status: Secondary | ICD-10-CM | POA: Diagnosis not present

## 2020-09-17 DIAGNOSIS — M8448XA Pathological fracture, other site, initial encounter for fracture: Secondary | ICD-10-CM | POA: Diagnosis not present

## 2020-09-17 DIAGNOSIS — M545 Low back pain, unspecified: Secondary | ICD-10-CM | POA: Diagnosis not present

## 2020-09-17 DIAGNOSIS — C8468 Anaplastic large cell lymphoma, ALK-positive, lymph nodes of multiple sites: Secondary | ICD-10-CM | POA: Diagnosis not present

## 2020-09-17 DIAGNOSIS — Z9221 Personal history of antineoplastic chemotherapy: Secondary | ICD-10-CM | POA: Diagnosis not present

## 2020-09-17 DIAGNOSIS — C8460 Anaplastic large cell lymphoma, ALK-positive, unspecified site: Secondary | ICD-10-CM | POA: Diagnosis not present

## 2020-10-09 ENCOUNTER — Ambulatory Visit (HOSPITAL_COMMUNITY): Payer: Medicare Other | Attending: Physician Assistant | Admitting: Physical Therapy

## 2020-10-09 ENCOUNTER — Encounter (HOSPITAL_COMMUNITY): Payer: Self-pay | Admitting: Physical Therapy

## 2020-10-09 ENCOUNTER — Other Ambulatory Visit: Payer: Self-pay

## 2020-10-09 DIAGNOSIS — G8929 Other chronic pain: Secondary | ICD-10-CM | POA: Insufficient documentation

## 2020-10-09 DIAGNOSIS — M5441 Lumbago with sciatica, right side: Secondary | ICD-10-CM | POA: Insufficient documentation

## 2020-10-09 DIAGNOSIS — M6281 Muscle weakness (generalized): Secondary | ICD-10-CM

## 2020-10-09 DIAGNOSIS — R262 Difficulty in walking, not elsewhere classified: Secondary | ICD-10-CM | POA: Diagnosis not present

## 2020-10-09 NOTE — Therapy (Addendum)
[place], [place], [address] Phone: [phone]   Fax:  [phone]  Physical Therapy Evaluation  Patient Details  Name: Megan Mcknight MRN: [id number] Date of Birth: 1961/04/26 Referring Provider (PT):  Felisa Bonier   Encounter Date: 10/09/2020   PT End of Session - 10/09/20 0940    Visit Number 1    Number of Visits 12    Date for PT Re-Evaluation 12/04/20    Authorization Type Medicare    Progress Note Due on Visit 10    PT Start Time 0831    PT Stop Time 0911    PT Time Calculation (min) 40 min    Activity Tolerance Patient tolerated treatment well    Behavior During Therapy [facility] for tasks assessed/performed           Past Medical History:  Diagnosis Date  . Acute bronchitis   . Anaplastic large cell lymphoma 2014   remission since 2015  . Asthma   . Cancer ([place])    lung and kidney  . Colon polyps 02/2013   Per colonoscopy  . Coma ([place])   . Diabetes mellitus without complication ([place])   . Diverticulosis 02/2013   Per colonoscopy  . Dysrhythmia   . Former light tobacco smoker   . GERD (gastroesophageal reflux disease)   . H/O stem cell transplant ([place]) 2015  . Hyperlipidemia   . Nerve pain    Left leg  . Non Hodgkin's lymphoma ([place])   . Seasonal allergies     Past Surgical History:  Procedure Laterality Date  . bones removed from Baby toes    . BRONCHIAL BRUSHINGS  04/25/2020   Procedure: BRONCHIAL BRUSHINGS;  Surgeon: Rigoberto Noel, MD;  Location: [place];  Service: Cardiopulmonary;;  . BRONCHIAL WASHINGS  04/25/2020   Procedure: BRONCHIAL WASHINGS;  Surgeon: Rigoberto Noel, MD;  Location: [place];  Service: Cardiopulmonary;;  . COLONOSCOPY  02/16/2012   Procedure: COLONOSCOPY;  Surgeon: Daneil Dolin, MD;  [facility];  Service: Endoscopy;  Laterality: N/A;  1:30  . COLONOSCOPY N/A 03/26/2017   Procedure: COLONOSCOPY;  Surgeon: Daneil Dolin, MD;  [facility];  Service: Endoscopy;  Laterality: N/A;  930  . IR IMAGING GUIDED PORT INSERTION  12/19/2019  . LUNG BIOPSY  04/25/2020   Procedure: LUNG BIOPSY;  Surgeon: Rigoberto Noel, MD;  [facility];  Service: Cardiopulmonary;;  . MEDIASTINOSCOPY N/A 05/13/2013   Procedure: MEDIASTINOSCOPY;  Surgeon: Melrose Nakayama, MD;  Location: [place];  Service: Thoracic;  Laterality: N/A;  . PARTIAL HYSTERECTOMY    . VAGINAL HYSTERECTOMY     partial hyst   . VIDEO BRONCHOSCOPY N/A 04/25/2020   Procedure: VIDEO BRONCHOSCOPY WITH FLUORO;  Surgeon: Rigoberto Noel, MD;  Location: [place];  Service: Cardiopulmonary;  Laterality: N/A;  . VIDEO BRONCHOSCOPY WITH ENDOBRONCHIAL ULTRASOUND N/A 05/13/2013   Procedure: VIDEO BRONCHOSCOPY WITH ENDOBRONCHIAL ULTRASOUND;  Surgeon: Melrose Nakayama, MD;  Location: [place];  Service: Thoracic;  Laterality: N/A;    There were no vitals filed for this visit.    Subjective Assessment - 10/09/20 0923    Subjective States that she had lymphoma and it cracked her L5 and she started having pain in her back November of last year. States pain was so bad she could hardly walk. States she had just gone back to work from the previous cancer. At the time she was still on  chemo. Currently no longer taking chemo and not currently on any cancer treatments. Current pain level is 0/10 at rest but with movement 5/10 and reported as stiff with transitional movements (bed mobility and sit to stand). Pain occasionally goes down leg on right side just below buttocks. This is worse in the morning when she first gets up in the morning .  No restrictions for fracture and is not currently working. States that she stays busy with cleaning her house.    Patient Stated Goals to be able to return to her exercise program and to be stronger    Currently in Pain? Yes    Pain Score 5     Pain Location Back    Pain Orientation Right    Pain Descriptors / Indicators Tightness    Pain  Type Chronic pain    Pain Radiating Towards towards right buttocks    Pain Onset More than a month ago    Aggravating Factors  transitional movements and walking    Pain Relieving Factors rest              [facility] PT Assessment - 10/09/20 0001      Assessment   Medical Diagnosis LBP secondary to pathological fracture at L5     Referring Provider (PT)  Felisa Bonier    Prior Therapy none       Precautions   Precautions None      Balance Screen   Has the patient fallen in the past 6 months No      [place] residence      Prior Function   Level of Independence Independent      Cognition   Overall Cognitive Status Within Functional Limits for tasks assessed      Observation/Other Assessments   Focus on Therapeutic Outcomes (FOTO)  45.64% function      ROM / Strength   AROM / PROM / Strength AROM;Strength      AROM   AROM Assessment Site Lumbar    Lumbar Flexion 50% limited   catches in low back with return upright    Lumbar Extension 50% limited   pain in bilateral buttocks.    Lumbar - Left Side Bend 50% limited    Lumbar - Right Rotation 75% limited   pain on the right side     Strength   Strength Assessment Site Hip;Knee;Ankle    Right/Left Hip Right;Left    Right Hip Flexion 3+/5   minor pain    Right Hip Extension 3+/5    Right Hip ABduction 4/5    Left Hip Flexion 4-/5    Left Hip Extension 4-/5    Left Hip ABduction 4+/5    Right/Left Knee Right;Left    Right Knee Flexion 4+/5    Right Knee Extension 4+/5    Left Knee Flexion 4+/5    Left Knee Extension 4+/5    Right/Left Ankle Right;Left    Right Ankle Dorsiflexion 5/5    Left Ankle Dorsiflexion 5/5      Flexibility   Soft Tissue Assessment /Muscle Length yes    Quadriceps limited bilaterally - prone ely test able to bend knee to 90 degrees on either side      Bed Mobility   Bed Mobility Rolling Right;Rolling Left;Supine to Sit    Rolling Right Other  (comment)   modified independence- pain and difficulties   Rolling Left Other (comment)   modified independence- pain and difficulties  Supine to Sit Other (comment)   modified independence- pain and difficulties     Transfers   Transfers Sit to Stand;Stand to Sit    Sit to Stand 6: Modified independent (Device/Increase time);With upper extremity assist    Stand to Sit 6: Modified independent (Device/Increase time);With upper extremity assist                      Objective measurements completed on examination: See above findings.       [facility] Adult PT Treatment/Exercise - 10/09/20 0001      Exercises   Exercises Lumbar      Lumbar Exercises: Prone   Other Prone Lumbar Exercises hamstring curl 4x5 5" holds B                   PT Education - 10/09/20 1005    Education Details on HEP, current presentation, on bed mobilities as exercise    Person(s) Educated Patient    Methods Explanation    Comprehension Verbalized understanding            PT Short Term Goals - 10/09/20 0925      PT SHORT TERM GOAL #1   Title Patient will be able to roll over in bed from supine to prone without pain or difficulties to demonstrate improved transitional mobility    Time 4    Period Weeks    Status New    Target Date 11/06/20      PT SHORT TERM GOAL #2   Title Patient will report at least 25% improvement in overall symptoms and/or function to demonstrate improved functional mobility    Time 4    Period Weeks    Status New    Target Date 11/06/20      PT SHORT TERM GOAL #3   Title Patient will be independent in self management strategies to improve quality of life and functional outcomes.    Time 4    Period Weeks    Status New    Target Date 11/06/20             PT Long Term Goals - 10/09/20 0929      PT LONG TERM GOAL #1   Title Patient will be able to transition from sit to stand without use of upper extremities and without difficulty to demonstrate  improved transitional mobility    Time 8    Period Weeks    Status New    Target Date 12/04/20      PT LONG TERM GOAL #2   Title Patient will improve on FOTO score to meet predicted outcomes to demonstrate improved functional mobility.    Time 8    Period Weeks    Status New    Target Date 12/04/20      PT LONG TERM GOAL #3   Title Patient will report at least 50% improvement in overall symptoms and/or function to demonstrate improved functional mobility    Time 8    Period Weeks    Status New    Target Date 12/04/20                  Plan - 10/09/20 0940    Clinical Impression Statement Patient presents with low back pain secondary to pathological fracture at L5 due to mets to her lumbar spine. Patient not currently getting active cancer treatments but was earlier this year. Per patient fracture unlikely to heal without surgery but main goal  at this time is to improve strength and overall function. Educated patient in current presentation and function and how therapy will help with strength. Patient would benefit from skilled physical therapy to improve overall function and quality of life.    Personal Factors and Comorbidities Comorbidity 1;Comorbidity 2;Comorbidity 3+    Comorbidities lymphoma, hx of chemo, DB    Examination-Activity Limitations Bed Mobility;Bend;Squat;Stairs;Stand;Transfers;Locomotion Level;Lift;Sit    Examination-Participation Restrictions Cleaning;Community Activity;Meal Prep;Laundry;Shop    Stability/Clinical Decision Making Evolving/Moderate complexity    Clinical Decision Making Moderate    Rehab Potential Good    PT Frequency Other (comment)   1-2x/week for total of 12 visits over 8 week certification   PT Duration 8 weeks    PT Treatment/Interventions ADLs/Self Care Home Management;Aquatic Therapy;Cryotherapy;Moist Heat;Traction;Balance training;Therapeutic exercise;Therapeutic activities;Functional mobility training;Stair training;Gait  training;Neuromuscular re-education;Patient/family education;Manual techniques;Joint Manipulations    PT Next Visit Plan LE strengthening and lumbar ROM - start with table exercises - add basice HEP for upper extremity for patient to work on independently at home - focus on unweight shoulder movements/exercises    PT Home Exercise Plan prone knee bends, bed rolling    Consulted and Agree with Plan of Care Patient           Patient will benefit from skilled therapeutic intervention in order to improve the following deficits and impairments:  Decreased endurance, Pain, Decreased strength, Decreased activity tolerance, Decreased balance, Decreased mobility, Difficulty walking, Decreased range of motion  Visit Diagnosis: Chronic midline low back pain with right-sided sciatica - Plan: PT plan of care cert/re-cert  Difficulty in walking, not elsewhere classified - Plan: PT plan of care cert/re-cert  Muscle weakness (generalized) - Plan: PT plan of care cert/re-cert     Problem List Patient Active Problem List   Diagnosis Date Noted  . Acute right-sided low back pain   . S/P bronchoscopy   . FUO (fever of unknown origin) 04/26/2020  . Lung nodules   . Sepsis due to undetermined organism ([place]) 04/22/2020  . Lobar pneumonia ([place]) 04/22/2020  . 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 Sepsis due to pneumonia ([place]) 04/21/2020  . Type 2 diabetes mellitus with hyperglycemia, without long-term current use of insulin ([place]) 02/20/2020  . Essential hypertension, benign 02/20/2020  . Mixed hyperlipidemia 02/20/2020  . Port-A-Cath in place 01/12/2020  . Trichomonal infection 02/17/2019  . PTTD (posterior tibial tendon dysfunction) 08/21/2015  . Transaminitis 05/31/2014  . Inguinal adenopathy 10/20/2013  . Breast mass, left 10/20/2013  . Neuropathy due to chemotherapeutic drug ([place]) 09/29/2013  . Tumor lysis syndrome 08/21/2013  . Hyperglycemia  without ketosis 08/21/2013  . Acute renal failure ([place]) 08/21/2013  . Acute encephalopathy 08/21/2013  . Hyperkalemia 08/21/2013  . Lymphoma ([place]) 05/19/2013  . Shortness of breath 05/06/2013  . Atrial fibrillation with RVR ([place]) 05/06/2013  . Bilateral pneumonia 05/06/2013  . Acute renal insufficiency 05/06/2013  . Normocytic anemia 05/06/2013  . GERD (gastroesophageal reflux disease) 02/02/2012    10:05 AM, 10/09/20 Jerene Pitch, DPT Physical Therapy with [facility]  [phone] office  [place] [address] [place], [place], [address] Phone: [phone]   Fax:  [phone]  Name: Megan Mcknight MRN: [id number] Date of Birth: 1961-10-29

## 2020-10-11 ENCOUNTER — Encounter (HOSPITAL_COMMUNITY): Payer: Self-pay | Admitting: Physical Therapy

## 2020-10-11 ENCOUNTER — Other Ambulatory Visit: Payer: Self-pay

## 2020-10-11 ENCOUNTER — Ambulatory Visit (HOSPITAL_COMMUNITY): Payer: Medicare Other | Admitting: Physical Therapy

## 2020-10-11 DIAGNOSIS — M6281 Muscle weakness (generalized): Secondary | ICD-10-CM

## 2020-10-11 DIAGNOSIS — G8929 Other chronic pain: Secondary | ICD-10-CM | POA: Diagnosis not present

## 2020-10-11 DIAGNOSIS — M5441 Lumbago with sciatica, right side: Secondary | ICD-10-CM | POA: Diagnosis not present

## 2020-10-11 DIAGNOSIS — R262 Difficulty in walking, not elsewhere classified: Secondary | ICD-10-CM

## 2020-10-11 NOTE — Therapy (Signed)
[place], [place], [address] Phone: [phone]   Fax:  [phone]  Physical Therapy Treatment  Patient Details  Name: Megan Mcknight MRN: [id number] Date of Birth: [birth year] Referring Provider (PT):  Felisa Bonier   Encounter Date: 10/11/2020   PT End of Session - 10/11/20 1317    Visit Number 2    Number of Visits 12    Date for PT Re-Evaluation 12/04/20    Authorization Type Medicare    Progress Note Due on Visit 10    PT Start Time 3267    PT Stop Time 1357    PT Time Calculation (min) 40 min    Activity Tolerance Patient tolerated treatment well    Behavior During Therapy [facility] for tasks assessed/performed           Past Medical History:  Diagnosis Date  . Acute bronchitis   . Anaplastic large cell lymphoma 2014   remission since 2015  . Asthma   . Cancer ([place])    lung and kidney  . Colon polyps 02/2013   Per colonoscopy  . Coma ([place])   . Diabetes mellitus without complication ([place])   . Diverticulosis 02/2013   Per colonoscopy  . Dysrhythmia   . Former light tobacco smoker   . GERD (gastroesophageal reflux disease)   . H/O stem cell transplant ([place]) 2015  . Hyperlipidemia   . Nerve pain    Left leg  . Non Hodgkin's lymphoma ([place])   . Seasonal allergies     Past Surgical History:  Procedure Laterality Date  . bones removed from Baby toes    . BRONCHIAL BRUSHINGS  04/25/2020   Procedure: BRONCHIAL BRUSHINGS;  Surgeon: Rigoberto Noel, MD;  Location: [place];  Service: Cardiopulmonary;;  . BRONCHIAL WASHINGS  04/25/2020   Procedure: BRONCHIAL WASHINGS;  Surgeon: Rigoberto Noel, MD;  [place]  Service: Cardiopulmonary;;  . COLONOSCOPY  02/16/2012   Procedure: COLONOSCOPY;  Surgeon: Daneil Dolin, MD;  [facility];  Service: Endoscopy;  Laterality: N/A;  1:30  . COLONOSCOPY N/A 03/26/2017   Procedure: COLONOSCOPY;  Surgeon: Daneil Dolin, MD;  [facility];  Service: Endoscopy;  Laterality: N/A;  930  . IR IMAGING GUIDED PORT INSERTION  12/19/2019  . LUNG BIOPSY  04/25/2020   Procedure: LUNG BIOPSY;  Surgeon: Rigoberto Noel, MD;  Location: [facility] ENDOSCOPY;  Service: Cardiopulmonary;;  . MEDIASTINOSCOPY N/A 05/13/2013   Procedure: MEDIASTINOSCOPY;  Surgeon: Melrose Nakayama, MD;  Location: [place];  Service: Thoracic;  Laterality: N/A;  . PARTIAL HYSTERECTOMY    . VAGINAL HYSTERECTOMY     partial hyst   . VIDEO BRONCHOSCOPY N/A 04/25/2020   Procedure: VIDEO BRONCHOSCOPY WITH FLUORO;  Surgeon: Rigoberto Noel, MD;  Location: [place];  Service: Cardiopulmonary;  Laterality: N/A;  . VIDEO BRONCHOSCOPY WITH ENDOBRONCHIAL ULTRASOUND N/A 05/13/2013   Procedure: VIDEO BRONCHOSCOPY WITH ENDOBRONCHIAL ULTRASOUND;  Surgeon: Melrose Nakayama, MD;  Location: [place];  Service: Thoracic;  Laterality: N/A;    There were no vitals filed for this visit.   Subjective Assessment - 10/11/20 1321    Subjective States she is feeling good today with 4/10 pain in the low back described as tightness.    Patient Stated Goals to be able to return to her exercise program and to be stronger    Pain Onset More than a month ago  [facility] PT Assessment - 10/11/20 0001      Assessment   Medical Diagnosis LBP secondary to pathological fracture at L5     Referring Provider (PT)  Felisa Bonier      Balance Screen   Has the patient fallen in the past 6 months No                         [facility] Adult PT Treatment/Exercise - 10/11/20 0001      Lumbar Exercises: Standing   Other Standing Lumbar Exercises hamstring curls - x25 5" holds B       Lumbar Exercises: Seated   Other Seated Lumbar Exercises sit to stand no UE assist x25    Other Seated Lumbar Exercises shoulder flexion with dowel 3x10 - pain free range of motion       Lumbar Exercises: Supine   Ab Set 20 reps;5 seconds   cue - bear down as if you are going to the  bathroom"    Bridge 3 seconds;10 reps   3 sets   Straight Leg Raise 2 seconds   30 reps - each leg                 PT Education - 10/11/20 1341    Education Details HEP, on anatomy of abdominal muscles.    Person(s) Educated Patient    Methods Explanation    Comprehension Verbalized understanding            PT Short Term Goals - 10/09/20 0925      PT SHORT TERM GOAL #1   Title Patient will be able to roll over in bed from supine to prone without pain or difficulties to demonstrate improved transitional mobility    Time 4    Period Weeks    Status New    Target Date 11/06/20      PT SHORT TERM GOAL #2   Title Patient will report at least 25% improvement in overall symptoms and/or function to demonstrate improved functional mobility    Time 4    Period Weeks    Status New    Target Date 11/06/20      PT SHORT TERM GOAL #3   Title Patient will be independent in self management strategies to improve quality of life and functional outcomes.    Time 4    Period Weeks    Status New    Target Date 11/06/20             PT Long Term Goals - 10/11/20 1344      PT LONG TERM GOAL #1   Title Patient will be able to transition from sit to stand without use of upper extremities and without difficulty to demonstrate improved transitional mobility    Time 8    Period Weeks    Status Achieved      PT LONG TERM GOAL #2   Title Patient will improve on FOTO score to meet predicted outcomes to demonstrate improved functional mobility.    Time 8    Period Weeks    Status On-going      PT LONG TERM GOAL #3   Title Patient will report at least 50% improvement in overall symptoms and/or function to demonstrate improved functional mobility    Time 8    Period Weeks    Status On-going                 Plan -  10/11/20 1350    Clinical Impression Statement Tolerated today's session well. Added lower extremity exercises to HEP with no reports of increased pain. Added  one shoulder exercise which was initially painful towards available end range of motion, instructed patient to stay within a pain free range of motion and with each set, pain free range of motion of shoulder flexion improved. Will continue to progress strengthening exercises as tolerated.    Personal Factors and Comorbidities Comorbidity 1;Comorbidity 2;Comorbidity 3+    Comorbidities lymphoma, hx of chemo, DB    Examination-Activity Limitations Bed Mobility;Bend;Squat;Stairs;Stand;Transfers;Locomotion Level;Lift;Sit    Examination-Participation Restrictions Cleaning;Community Activity;Meal Prep;Laundry;Shop    Stability/Clinical Decision Making Evolving/Moderate complexity    Rehab Potential Good    PT Frequency Other (comment)   1-2x/week for total of 12 visits over 8 week certification   PT Duration 8 weeks    PT Treatment/Interventions ADLs/Self Care Home Management;Aquatic Therapy;Cryotherapy;Moist Heat;Traction;Balance training;Therapeutic exercise;Therapeutic activities;Functional mobility training;Stair training;Gait training;Neuromuscular re-education;Patient/family education;Manual techniques;Joint Manipulations    PT Next Visit Plan LE strengthening and lumbar ROM - start with table exercises - add basic HEP for upper extremity for patient to work on independently at home - focus on unweight shoulder movements/exercises    PT Home Exercise Plan prone knee bends, bed rolling; 10/28 SLR, bridges, TRA activation, hamstring curls, shoulder flexion with dowel    Consulted and Agree with Plan of Care Patient           Patient will benefit from skilled therapeutic intervention in order to improve the following deficits and impairments:  Decreased endurance, Pain, Decreased strength, Decreased activity tolerance, Decreased balance, Decreased mobility, Difficulty walking, Decreased range of motion  Visit Diagnosis: Chronic midline low back pain with right-sided sciatica  Difficulty in  walking, not elsewhere classified  Muscle weakness (generalized)     Problem List Patient Active Problem List   Diagnosis Date Noted  . Acute right-sided low back pain   . S/P bronchoscopy   . FUO (fever of unknown origin) 04/26/2020  . Lung nodules   . Sepsis due to undetermined organism ([place]) 04/22/2020  . Lobar pneumonia ([place]) 04/22/2020  . 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 Sepsis due to pneumonia ([place]) 04/21/2020  . Type 2 diabetes mellitus with hyperglycemia, without long-term current use of insulin ([place]) 02/20/2020  . Essential hypertension, benign 02/20/2020  . Mixed hyperlipidemia 02/20/2020  . Port-A-Cath in place 01/12/2020  . Trichomonal infection 02/17/2019  . PTTD (posterior tibial tendon dysfunction) 08/21/2015  . Transaminitis 05/31/2014  . Inguinal adenopathy 10/20/2013  . Breast mass, left 10/20/2013  . Neuropathy due to chemotherapeutic drug ([employer]) 09/29/2013  . Tumor lysis syndrome 08/21/2013  . Hyperglycemia without ketosis 08/21/2013  . Acute renal failure ([place]) 08/21/2013  . Acute encephalopathy 08/21/2013  . Hyperkalemia 08/21/2013  . Lymphoma ([place]) 05/19/2013  . Shortness of breath 05/06/2013  . Atrial fibrillation with RVR ([place]) 05/06/2013  . Bilateral pneumonia 05/06/2013  . Acute renal insufficiency 05/06/2013  . Normocytic anemia 05/06/2013  . GERD (gastroesophageal reflux disease) 02/02/2012   4:04 PM, 10/11/20 Jerene Pitch, DPT Physical Therapy with [facility]  [phone] office  [place] [address] [place], [place], [address] Phone: [phone]   Fax:  [phone]  Name: ELIANI LECLERE MRN: [id number] Date of Birth: Jun 24, 1961

## 2020-10-17 ENCOUNTER — Other Ambulatory Visit: Payer: Self-pay

## 2020-10-17 ENCOUNTER — Ambulatory Visit (HOSPITAL_COMMUNITY)
Admission: RE | Admit: 2020-10-17 | Discharge: 2020-10-17 | Disposition: A | Discharge status: Home / Self Care | Payer: Medicare Other | Source: Ambulatory Visit | Attending: Internal Medicine | Admitting: Internal Medicine

## 2020-10-17 DIAGNOSIS — Z1231 Encounter for screening mammogram for malignant neoplasm of breast: Secondary | ICD-10-CM

## 2020-10-18 ENCOUNTER — Ambulatory Visit (HOSPITAL_COMMUNITY): Payer: Medicare Other | Attending: Physician Assistant | Admitting: Physical Therapy

## 2020-10-18 DIAGNOSIS — M5441 Lumbago with sciatica, right side: Secondary | ICD-10-CM | POA: Insufficient documentation

## 2020-10-18 DIAGNOSIS — M6281 Muscle weakness (generalized): Secondary | ICD-10-CM | POA: Diagnosis not present

## 2020-10-18 DIAGNOSIS — G8929 Other chronic pain: Secondary | ICD-10-CM

## 2020-10-18 DIAGNOSIS — R262 Difficulty in walking, not elsewhere classified: Secondary | ICD-10-CM

## 2020-10-18 NOTE — Therapy (Signed)
[place], [place], [address] Phone: [phone]   Fax:  [phone]  Physical Therapy Treatment  Patient Details  Name: Megan Mcknight MRN: [id number] Date of Birth: Mar 07, 1961 Referring Provider (PT):  Felisa Bonier   Encounter Date: 10/18/2020   PT End of Session - 10/18/20 1354    Visit Number 3    Number of Visits 12    Date for PT Re-Evaluation 12/04/20    Authorization Type Medicare    Progress Note Due on Visit 10    PT Start Time 1133    PT Stop Time 1214    PT Time Calculation (min) 41 min    Activity Tolerance Patient tolerated treatment well    Behavior During Therapy [facility] for tasks assessed/performed           Past Medical History:  Diagnosis Date  . Acute bronchitis   . Anaplastic large cell lymphoma 2014   remission since 2015  . Asthma   . Cancer ([place])    lung and kidney  . Colon polyps 02/2013   Per colonoscopy  . Coma ([place])   . Diabetes mellitus without complication ([place])   . Diverticulosis 02/2013   Per colonoscopy  . Dysrhythmia   . Former light tobacco smoker   . GERD (gastroesophageal reflux disease)   . H/O stem cell transplant ([place]) 2015  . Hyperlipidemia   . Nerve pain    Left leg  . Non Hodgkin's lymphoma ([place])   . Seasonal allergies     Past Surgical History:  Procedure Laterality Date  . bones removed from Baby toes    . BRONCHIAL BRUSHINGS  04/25/2020   Procedure: BRONCHIAL BRUSHINGS;  Surgeon: Rigoberto Noel, MD;  Location: [place];  Service: Cardiopulmonary;;  . BRONCHIAL WASHINGS  04/25/2020   Procedure: BRONCHIAL WASHINGS;  Surgeon: Rigoberto Noel, MD;  Location: [place];  Service: Cardiopulmonary;;  . COLONOSCOPY  02/16/2012   Procedure: COLONOSCOPY;  Surgeon: Daneil Dolin, MD;  [facility];  Service: Endoscopy;  Laterality: N/A;  1:30  . COLONOSCOPY N/A 03/26/2017   Procedure: COLONOSCOPY;  Surgeon: Daneil Dolin, MD;  [facility];  Service: Endoscopy;  Laterality: N/A;  930  . IR IMAGING GUIDED PORT INSERTION  12/19/2019  . LUNG BIOPSY  04/25/2020   Procedure: LUNG BIOPSY;  Surgeon: Rigoberto Noel, MD;  Location: [facility];  Service: Cardiopulmonary;;  . MEDIASTINOSCOPY N/A 05/13/2013   Procedure: MEDIASTINOSCOPY;  Surgeon: [place] Nakayama, MD;  Location: [place];  Service: Thoracic;  Laterality: N/A;  . PARTIAL HYSTERECTOMY    . VAGINAL HYSTERECTOMY     partial hyst   . VIDEO BRONCHOSCOPY N/A 04/25/2020   Procedure: VIDEO BRONCHOSCOPY WITH FLUORO;  Surgeon: Rigoberto Noel, MD;  Location: [place];  Service: Cardiopulmonary;  Laterality: N/A;  . VIDEO BRONCHOSCOPY WITH ENDOBRONCHIAL ULTRASOUND N/A 05/13/2013   Procedure: VIDEO BRONCHOSCOPY WITH ENDOBRONCHIAL ULTRASOUND;  Surgeon: [place] Nakayama, MD;  Location: [place];  Service: Thoracic;  Laterality: N/A;    There were no vitals filed for this visit.   Subjective Assessment - 10/18/20 1148    Subjective pt states she is not hurting currently.  Reports compliance with HEP.    Currently in Pain? No/denies                             [facility] Adult PT Treatment/Exercise -  10/18/20 0001      Lumbar Exercises: Seated   Other Seated Lumbar Exercises sit to stand no UE assist 2X10 (P)     Other Seated Lumbar Exercises shoulder flexion with 1# dowel 2x10  - pain free range of motion , scapular retractions 10X (P)       Lumbar Exercises: Supine   Ab Set 5 seconds;10 reps;Limitations (P)     AB Set Limitations 3 sets (P)     Bridge 10 reps;Limitations (P)     Bridge Limitations 3 sets  (P)     Straight Leg Raise 10 reps;Limitations (P)     Straight Leg Raises Limitations 3 sets with ab stab (P)       Lumbar Exercises: Sidelying   Hip Abduction Both;10 reps;Limitations (P)     Hip Abduction Weights (lbs) 3 sets (P)                     PT Short Term Goals - 10/09/20 2694      PT SHORT TERM GOAL #1   Title  Patient will be able to roll over in bed from supine to prone without pain or difficulties to demonstrate improved transitional mobility    Time 4    Period Weeks    Status New    Target Date 11/06/20      PT SHORT TERM GOAL #2   Title Patient will report at least 25% improvement in overall symptoms and/or function to demonstrate improved functional mobility    Time 4    Period Weeks    Status New    Target Date 11/06/20      PT SHORT TERM GOAL #3   Title Patient will be independent in self management strategies to improve quality of life and functional outcomes.    Time 4    Period Weeks    Status New    Target Date 11/06/20             PT Long Term Goals - 10/11/20 1344      PT LONG TERM GOAL #1   Title Patient will be able to transition from sit to stand without use of upper extremities and without difficulty to demonstrate improved transitional mobility    Time 8    Period Weeks    Status Achieved      PT LONG TERM GOAL #2   Title Patient will improve on FOTO score to meet predicted outcomes to demonstrate improved functional mobility.    Time 8    Period Weeks    Status On-going      PT LONG TERM GOAL #3   Title Patient will report at least 50% improvement in overall symptoms and/or function to demonstrate improved functional mobility    Time 8    Period Weeks    Status On-going                 Plan - 10/18/20 1350    Clinical Impression Statement Continued focus on improving LE strength and core stability.  Able to complete 3 sets of all exercises today with short rest break between each.  Noted fatigue at EOS.  No change in pain or symptoms. Encouraged to continue HEP    Personal Factors and Comorbidities Comorbidity 1;Comorbidity 2;Comorbidity 3+    Comorbidities lymphoma, hx of chemo, DB    Examination-Activity Limitations Bed Mobility;Bend;Squat;Stairs;Stand;Transfers;Locomotion Level;Lift;Sit    Examination-Participation Restrictions  Cleaning;Community Activity;Meal Prep;Laundry;Shop    Stability/Clinical Decision Making Evolving/Moderate  complexity    Rehab Potential Good    PT Frequency Other (comment)   1-2x/week for total of 12 visits over 8 week certification   PT Duration 8 weeks    PT Treatment/Interventions ADLs/Self Care Home Management;Aquatic Therapy;Cryotherapy;Moist Heat;Traction;Balance training;Therapeutic exercise;Therapeutic activities;Functional mobility training;Stair training;Gait training;Neuromuscular re-education;Patient/family education;Manual techniques;Joint Manipulations    PT Next Visit Plan LE strengthening and lumbar ROM - start with table exercises - add basic HEP for upper extremity for patient to work on independently at home - focus on unweight shoulder movements/exercises    PT Home Exercise Plan prone knee bends, bed rolling; 10/28 SLR, bridges, TRA activation, hamstring curls, shoulder flexion with dowel    Consulted and Agree with Plan of Care Patient           Patient will benefit from skilled therapeutic intervention in order to improve the following deficits and impairments:  Decreased endurance, Pain, Decreased strength, Decreased activity tolerance, Decreased balance, Decreased mobility, Difficulty walking, Decreased range of motion  Visit Diagnosis: Chronic midline low back pain with right-sided sciatica  Difficulty in walking, not elsewhere classified  Muscle weakness (generalized)     Problem List Patient Active Problem List   Diagnosis Date Noted  . Acute right-sided low back pain   . S/P bronchoscopy   . FUO (fever of unknown origin) 04/26/2020  . Lung nodules   . Sepsis due to undetermined organism ([place]) 04/22/2020  . Lobar pneumonia ([place]) 04/22/2020  . 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 Sepsis due to pneumonia ([place]) 04/21/2020  . Type 2 diabetes mellitus with hyperglycemia, without long-term current use of  insulin ([place]) 02/20/2020  . Essential hypertension, benign 02/20/2020  . Mixed hyperlipidemia 02/20/2020  . Port-A-Cath in place 01/12/2020  . Trichomonal infection 02/17/2019  . PTTD (posterior tibial tendon dysfunction) 08/21/2015  . Transaminitis 05/31/2014  . Inguinal adenopathy 10/20/2013  . Breast mass, left 10/20/2013  . Neuropathy due to chemotherapeutic drug ([place]) 09/29/2013  . Tumor lysis syndrome 08/21/2013  . Hyperglycemia without ketosis 08/21/2013  . Acute renal failure ([place]) 08/21/2013  . Acute encephalopathy 08/21/2013  . Hyperkalemia 08/21/2013  . Lymphoma ([place]) 05/19/2013  . Shortness of breath 05/06/2013  . Atrial fibrillation with RVR ([place]) 05/06/2013  . Bilateral pneumonia 05/06/2013  . Acute renal insufficiency 05/06/2013  . Normocytic anemia 05/06/2013  . GERD (gastroesophageal reflux disease) 02/02/2012   Teena Irani, PTA/CLT [phone]  Teena Irani 10/18/2020, 1:55 PM  [place], [place], [address] Phone: [phone]   Fax:  [phone]  Name: Megan Mcknight MRN: [id number] Date of Birth: [birth year]

## 2020-10-23 ENCOUNTER — Ambulatory Visit (HOSPITAL_COMMUNITY): Payer: Medicare Other | Admitting: Physical Therapy

## 2020-10-23 ENCOUNTER — Other Ambulatory Visit: Payer: Self-pay

## 2020-10-23 DIAGNOSIS — M6281 Muscle weakness (generalized): Secondary | ICD-10-CM | POA: Diagnosis not present

## 2020-10-23 DIAGNOSIS — R262 Difficulty in walking, not elsewhere classified: Secondary | ICD-10-CM

## 2020-10-23 DIAGNOSIS — G8929 Other chronic pain: Secondary | ICD-10-CM | POA: Diagnosis not present

## 2020-10-23 DIAGNOSIS — M5441 Lumbago with sciatica, right side: Secondary | ICD-10-CM | POA: Diagnosis not present

## 2020-10-23 NOTE — Therapy (Signed)
[place], [place], [address] Phone: [phone]   Fax:  [phone]  Physical Therapy Treatment  Patient Details  Name: Megan Mcknight MRN: [id number] Date of Birth: [birth year] Referring Provider (PT):  Felisa Bonier   Encounter Date: 10/23/2020   PT End of Session - 10/23/20 1442    Visit Number 4    Number of Visits 12    Date for PT Re-Evaluation 12/04/20    Authorization Type Medicare    Progress Note Due on Visit 10    PT Start Time 7425    PT Stop Time 1445    PT Time Calculation (min) 40 min    Activity Tolerance Patient tolerated treatment well    Behavior During Therapy [facility] for tasks assessed/performed           Past Medical History:  Diagnosis Date  . Acute bronchitis   . Anaplastic large cell lymphoma 2014   remission since 2015  . Asthma   . Cancer ([place])    lung and kidney  . Colon polyps 02/2013   Per colonoscopy  . Coma ([place])   . Diabetes mellitus without complication ([place])   . Diverticulosis 02/2013   Per colonoscopy  . Dysrhythmia   . Former light tobacco smoker   . GERD (gastroesophageal reflux disease)   . H/O stem cell transplant ([place]) 2015  . Hyperlipidemia   . Nerve pain    Left leg  . Non Hodgkin's lymphoma ([place])   . Seasonal allergies     Past Surgical History:  Procedure Laterality Date  . bones removed from Baby toes    . BRONCHIAL BRUSHINGS  04/25/2020   Procedure: BRONCHIAL BRUSHINGS;  Surgeon: Rigoberto Noel, MD;  Location: [place];  Service: Cardiopulmonary;;  . BRONCHIAL WASHINGS  04/25/2020   Procedure: BRONCHIAL WASHINGS;  Surgeon: Rigoberto Noel, MD;  Location: [place];  Service: Cardiopulmonary;;  . COLONOSCOPY  02/16/2012   Procedure: COLONOSCOPY;  Surgeon: Daneil Dolin, MD;  [facility];  Service: Endoscopy;  Laterality: N/A;  1:30  . COLONOSCOPY N/A 03/26/2017   Procedure: COLONOSCOPY;  Surgeon: Daneil Dolin, MD;  [facility];  Service: Endoscopy;  Laterality: N/A;  930  . IR IMAGING GUIDED PORT INSERTION  12/19/2019  . LUNG BIOPSY  04/25/2020   Procedure: LUNG BIOPSY;  Surgeon: Rigoberto Noel, MD;  Location: [facility] ENDOSCOPY;  Service: Cardiopulmonary;;  . MEDIASTINOSCOPY N/A 05/13/2013   Procedure: MEDIASTINOSCOPY;  Surgeon: [place] Nakayama, MD;  Location: [place];  Service: Thoracic;  Laterality: N/A;  . PARTIAL HYSTERECTOMY    . VAGINAL HYSTERECTOMY     partial hyst   . VIDEO BRONCHOSCOPY N/A 04/25/2020   Procedure: VIDEO BRONCHOSCOPY WITH FLUORO;  Surgeon: Rigoberto Noel, MD;  Location: [place];  Service: Cardiopulmonary;  Laterality: N/A;  . VIDEO BRONCHOSCOPY WITH ENDOBRONCHIAL ULTRASOUND N/A 05/13/2013   Procedure: VIDEO BRONCHOSCOPY WITH ENDOBRONCHIAL ULTRASOUND;  Surgeon: [place] Nakayama, MD;  Location: [place];  Service: Thoracic;  Laterality: N/A;    There were no vitals filed for this visit.   Subjective Assessment - 10/23/20 1411    Subjective Pt reports she drove to and from [place] over the weekend and had no issues.  STates she did have pain the other day due to the way she slept.   Currently without pain.    Currently in Pain? No/denies  [facility] Adult PT Treatment/Exercise - 10/23/20 0001      Lumbar Exercises: Stretches   Active Hamstring Stretch Right;Left;2 reps;30 seconds    Active Hamstring Stretch Limitations longsitting      Lumbar Exercises: Seated   Other Seated Lumbar Exercises sit to stand no UE assist 2X10    Other Seated Lumbar Exercises shoulder flexion with 2# dowel 2x10  - pain free range of motion , scapular retractions 10X      Lumbar Exercises: Supine   Bridge 10 reps;Limitations    Bridge Limitations 3 sets     Straight Leg Raise 10 reps;Limitations    Straight Leg Raises Limitations 3 sets with ab stab    Large Ball Abdominal Isometric 10 reps    Large Ball Abdominal Isometric Limitations green ball 3  sets    Large Ball Oblique Isometric 10 reps    Large Ball Oblique Isometric Limitations green ball 3 sets each dfirection.      Lumbar Exercises: Sidelying   Hip Abduction Both;10 reps;Limitations    Hip Abduction Weights (lbs) 3 sets                    PT Short Term Goals - 10/09/20 0925      PT SHORT TERM GOAL #1   Title Patient will be able to roll over in bed from supine to prone without pain or difficulties to demonstrate improved transitional mobility    Time 4    Period Weeks    Status New    Target Date 11/06/20      PT SHORT TERM GOAL #2   Title Patient will report at least 25% improvement in overall symptoms and/or function to demonstrate improved functional mobility    Time 4    Period Weeks    Status New    Target Date 11/06/20      PT SHORT TERM GOAL #3   Title Patient will be independent in self management strategies to improve quality of life and functional outcomes.    Time 4    Period Weeks    Status New    Target Date 11/06/20             PT Long Term Goals - 10/11/20 1344      PT LONG TERM GOAL #1   Title Patient will be able to transition from sit to stand without use of upper extremities and without difficulty to demonstrate improved transitional mobility    Time 8    Period Weeks    Status Achieved      PT LONG TERM GOAL #2   Title Patient will improve on FOTO score to meet predicted outcomes to demonstrate improved functional mobility.    Time 8    Period Weeks    Status On-going      PT LONG TERM GOAL #3   Title Patient will report at least 50% improvement in overall symptoms and/or function to demonstrate improved functional mobility    Time 8    Period Weeks    Status On-going                 Plan - 10/23/20 1441    Clinical Impression Statement Continued with established POC completing 3 sets of all exercises.  Able to increase to 2# cane today for seated UE flexion with good form and control. PT instructed with  long sitting hamstring stretch with noted tightness bilaterally.   Progressed to abdominal exercises using physioball for  both rectus abdominus and oblique musculature.  Noted exertion following session.  No return of pain at EOS and no complaints during session.    Personal Factors and Comorbidities Comorbidity 1;Comorbidity 2;Comorbidity 3+    Comorbidities lymphoma, hx of chemo, DB    Examination-Activity Limitations Bed Mobility;Bend;Squat;Stairs;Stand;Transfers;Locomotion Level;Lift;Sit    Examination-Participation Restrictions Cleaning;Community Activity;Meal Prep;Laundry;Shop    Stability/Clinical Decision Making Evolving/Moderate complexity    Rehab Potential Good    PT Frequency Other (comment)   1-2x/week for total of 12 visits over 8 week certification   PT Duration 8 weeks    PT Treatment/Interventions ADLs/Self Care Home Management;Aquatic Therapy;Cryotherapy;Moist Heat;Traction;Balance training;Therapeutic exercise;Therapeutic activities;Functional mobility training;Stair training;Gait training;Neuromuscular re-education;Patient/family education;Manual techniques;Joint Manipulations    PT Next Visit Plan LE strengthening and lumbar ROM - Progress to standing and functional strengthening.    PT Home Exercise Plan prone knee bends, bed rolling; 10/28 SLR, bridges, TRA activation, hamstring curls, shoulder flexion with dowel    Consulted and Agree with Plan of Care Patient           Patient will benefit from skilled therapeutic intervention in order to improve the following deficits and impairments:  Decreased endurance, Pain, Decreased strength, Decreased activity tolerance, Decreased balance, Decreased mobility, Difficulty walking, Decreased range of motion  Visit Diagnosis: Chronic midline low back pain with right-sided sciatica  Difficulty in walking, not elsewhere classified  Muscle weakness (generalized)     Problem List Patient Active Problem List   Diagnosis Date  Noted  . Acute right-sided low back pain   . S/P bronchoscopy   . FUO (fever of unknown origin) 04/26/2020  . Lung nodules   . Sepsis due to undetermined organism ([place]) 04/22/2020  . Lobar pneumonia ([place]) 04/22/2020  . 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 Sepsis due to pneumonia ([place]) 04/21/2020  . Type 2 diabetes mellitus with hyperglycemia, without long-term current use of insulin ([place]) 02/20/2020  . Essential hypertension, benign 02/20/2020  . Mixed hyperlipidemia 02/20/2020  . Port-A-Cath in place 01/12/2020  . Trichomonal infection 02/17/2019  . PTTD (posterior tibial tendon dysfunction) 08/21/2015  . Transaminitis 05/31/2014  . Inguinal adenopathy 10/20/2013  . Breast mass, left 10/20/2013  . Neuropathy due to chemotherapeutic drug ([place]) 09/29/2013  . Tumor lysis syndrome 08/21/2013  . Hyperglycemia without ketosis 08/21/2013  . Acute renal failure ([place]) 08/21/2013  . Acute encephalopathy 08/21/2013  . Hyperkalemia 08/21/2013  . Lymphoma ([place]) 05/19/2013  . Shortness of breath 05/06/2013  . Atrial fibrillation with RVR ([place]) 05/06/2013  . Bilateral pneumonia 05/06/2013  . Acute renal insufficiency 05/06/2013  . Normocytic anemia 05/06/2013  . GERD (gastroesophageal reflux disease) 02/02/2012   Teena Irani, PTA/CLT [phone]  Teena Irani 10/23/2020, 2:43 PM  [place] [address] [place], [place], [address] Phone: [phone]   Fax:  [phone]  Name: CHAISE PASSARELLA MRN: [id number] Date of Birth: [birth year]

## 2020-10-24 ENCOUNTER — Ambulatory Visit (HOSPITAL_COMMUNITY): Payer: Medicare Other | Admitting: Physical Therapy

## 2020-10-24 DIAGNOSIS — R262 Difficulty in walking, not elsewhere classified: Secondary | ICD-10-CM | POA: Diagnosis not present

## 2020-10-24 DIAGNOSIS — M6281 Muscle weakness (generalized): Secondary | ICD-10-CM

## 2020-10-24 DIAGNOSIS — M5441 Lumbago with sciatica, right side: Secondary | ICD-10-CM | POA: Diagnosis not present

## 2020-10-24 DIAGNOSIS — G8929 Other chronic pain: Secondary | ICD-10-CM

## 2020-10-24 NOTE — Therapy (Signed)
[place], [place], [address] Phone: [phone]   Fax:  [phone]  Physical Therapy Treatment  Patient Details  Name: Megan Mcknight MRN: [id number] Date of Birth: 19-Jun-1961 Referring Provider (PT):  Felisa Bonier   Encounter Date: 10/24/2020   PT End of Session - 10/24/20 1112    Visit Number 5    Number of Visits 12    Date for PT Re-Evaluation 12/04/20    Authorization Type Medicare    Progress Note Due on Visit 10    PT Start Time 0835    PT Stop Time 0915    PT Time Calculation (min) 40 min    Activity Tolerance Patient tolerated treatment well    Behavior During Therapy [facility] for tasks assessed/performed           Past Medical History:  Diagnosis Date  . Acute bronchitis   . Anaplastic large cell lymphoma 2014   remission since 2015  . Asthma   . Cancer ([place])    lung and kidney  . Colon polyps 02/2013   Per colonoscopy  . Coma ([place])   . Diabetes mellitus without complication ([place])   . Diverticulosis 02/2013   Per colonoscopy  . Dysrhythmia   . Former light tobacco smoker   . GERD (gastroesophageal reflux disease)   . H/O stem cell transplant ([place]) 2015  . Hyperlipidemia   . Nerve pain    Left leg  . Non Hodgkin's lymphoma ([place])   . Seasonal allergies     Past Surgical History:  Procedure Laterality Date  . bones removed from Baby toes    . BRONCHIAL BRUSHINGS  04/25/2020   Procedure: BRONCHIAL BRUSHINGS;  Surgeon: Rigoberto Noel, MD;  Location: [place];  Service: Cardiopulmonary;;  . BRONCHIAL WASHINGS  04/25/2020   Procedure: BRONCHIAL WASHINGS;  Surgeon: Rigoberto Noel, MD;  Location: [place];  Service: Cardiopulmonary;;  . COLONOSCOPY  02/16/2012   Procedure: COLONOSCOPY;  Surgeon: Daneil Dolin, MD;  [facility];  Service: Endoscopy;  Laterality: N/A;  1:30  . COLONOSCOPY N/A 03/26/2017   Procedure: COLONOSCOPY;  Surgeon: Daneil Dolin, MD;  [facility];  Service: Endoscopy;  Laterality: N/A;  930  . IR IMAGING GUIDED PORT INSERTION  12/19/2019  . LUNG BIOPSY  04/25/2020   Procedure: LUNG BIOPSY;  Surgeon: Rigoberto Noel, MD;  Location: [facility] ENDOSCOPY;  Service: Cardiopulmonary;;  . MEDIASTINOSCOPY N/A 05/13/2013   Procedure: MEDIASTINOSCOPY;  Surgeon: Melrose Nakayama, MD;  Location: [place];  Service: Thoracic;  Laterality: N/A;  . PARTIAL HYSTERECTOMY    . VAGINAL HYSTERECTOMY     partial hyst   . VIDEO BRONCHOSCOPY N/A 04/25/2020   Procedure: VIDEO BRONCHOSCOPY WITH FLUORO;  Surgeon: Rigoberto Noel, MD;  Location: [place];  Service: Cardiopulmonary;  Laterality: N/A;  . VIDEO BRONCHOSCOPY WITH ENDOBRONCHIAL ULTRASOUND N/A 05/13/2013   Procedure: VIDEO BRONCHOSCOPY WITH ENDOBRONCHIAL ULTRASOUND;  Surgeon: Melrose Nakayama, MD;  Location: [place];  Service: Thoracic;  Laterality: N/A;    There were no vitals filed for this visit.   Subjective Assessment - 10/24/20 0845    Subjective Pt statess she still has the pain in the morning, howwever it is much better than it was.  Currently without pain and no soreness from yesterdays session.    Currently in Pain? No/denies  [place] Adult PT Treatment/Exercise - 10/24/20 0001      Lumbar Exercises: Stretches   Active Hamstring Stretch Right;Left;2 reps;30 seconds    Active Hamstring Stretch Limitations longsitting      Lumbar Exercises: Standing   Heel Raises 20 reps      Lumbar Exercises: Seated   Other Seated Lumbar Exercises sit to stand no UE assist 2X10    Other Seated Lumbar Exercises shoulder flexion with 2# dowel 3x10  - pain free range of motion , scapular retractions 10X      Lumbar Exercises: Supine   Bridge 10 reps;Limitations    Bridge Limitations 3 sets     Straight Leg Raise 10 reps;Limitations    Straight Leg Raises Limitations 3 sets with ab stab    Large Ball Abdominal Isometric 10 reps    Large Ball  Abdominal Isometric Limitations green ball 3 sets    Large Ball Oblique Isometric 10 reps    Large Ball Oblique Isometric Limitations green ball 3 sets each dfirection.      Lumbar Exercises: Sidelying   Hip Abduction Both;10 reps;Limitations    Hip Abduction Weights (lbs) 3 sets                    PT Short Term Goals - 10/09/20 0925      PT SHORT TERM GOAL #1   Title Patient will be able to roll over in bed from supine to prone without pain or difficulties to demonstrate improved transitional mobility    Time 4    Period Weeks    Status New    Target Date 11/06/20      PT SHORT TERM GOAL #2   Title Patient will report at least 25% improvement in overall symptoms and/or function to demonstrate improved functional mobility    Time 4    Period Weeks    Status New    Target Date 11/06/20      PT SHORT TERM GOAL #3   Title Patient will be independent in self management strategies to improve quality of life and functional outcomes.    Time 4    Period Weeks    Status New    Target Date 11/06/20             PT Long Term Goals - 10/11/20 1344      PT LONG TERM GOAL #1   Title Patient will be able to transition from sit to stand without use of upper extremities and without difficulty to demonstrate improved transitional mobility    Time 8    Period Weeks    Status Achieved      PT LONG TERM GOAL #2   Title Patient will improve on FOTO score to meet predicted outcomes to demonstrate improved functional mobility.    Time 8    Period Weeks    Status On-going      PT LONG TERM GOAL #3   Title Patient will report at least 50% improvement in overall symptoms and/or function to demonstrate improved functional mobility    Time 8    Period Weeks    Status On-going                 Plan - 10/24/20 1114    Clinical Impression Statement Pt returns today feeling stronger and improving overall.  Continued with established therex with cues for general form needed  for ball abdominal exercises.  Added standing heelraises this session.  Pt without any  complaints or issues during or at conclusion of session.    Personal Factors and Comorbidities Comorbidity 1;Comorbidity 2;Comorbidity 3+    Comorbidities lymphoma, hx of chemo, DB    Examination-Activity Limitations Bed Mobility;Bend;Squat;Stairs;Stand;Transfers;Locomotion Level;Lift;Sit    Examination-Participation Restrictions Cleaning;Community Activity;Meal Prep;Laundry;Shop    Stability/Clinical Decision Making Evolving/Moderate complexity    Rehab Potential Good    PT Frequency Other (comment)   1-2x/week for total of 12 visits over 8 week certification   PT Duration 8 weeks    PT Treatment/Interventions ADLs/Self Care Home Management;Aquatic Therapy;Cryotherapy;Moist Heat;Traction;Balance training;Therapeutic exercise;Therapeutic activities;Functional mobility training;Stair training;Gait training;Neuromuscular re-education;Patient/family education;Manual techniques;Joint Manipulations    PT Next Visit Plan LE strengthening and lumbar ROM - Progress to standing and functional strengthening.    PT Home Exercise Plan prone knee bends, bed rolling; 10/28 SLR, bridges, TRA activation, hamstring curls, shoulder flexion with dowel    Consulted and Agree with Plan of Care Patient           Patient will benefit from skilled therapeutic intervention in order to improve the following deficits and impairments:  Decreased endurance, Pain, Decreased strength, Decreased activity tolerance, Decreased balance, Decreased mobility, Difficulty walking, Decreased range of motion  Visit Diagnosis: Muscle weakness (generalized)  Difficulty in walking, not elsewhere classified  Chronic midline low back pain with right-sided sciatica     Problem List Patient Active Problem List   Diagnosis Date Noted  . Acute right-sided low back pain   . S/P bronchoscopy   . FUO (fever of unknown origin) 04/26/2020  . Lung  nodules   . Sepsis due to undetermined organism ([place]) 04/22/2020  . Lobar pneumonia ([place]) 04/22/2020  . 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 Sepsis due to pneumonia ([place]) 04/21/2020  . Type 2 diabetes mellitus with hyperglycemia, without long-term current use of insulin ([place]) 02/20/2020  . Essential hypertension, benign 02/20/2020  . Mixed hyperlipidemia 02/20/2020  . Port-A-Cath in place 01/12/2020  . Trichomonal infection 02/17/2019  . PTTD (posterior tibial tendon dysfunction) 08/21/2015  . Transaminitis 05/31/2014  . Inguinal adenopathy 10/20/2013  . Breast mass, left 10/20/2013  . Neuropathy due to chemotherapeutic drug ([place]) 09/29/2013  . Tumor lysis syndrome 08/21/2013  . Hyperglycemia without ketosis 08/21/2013  . Acute renal failure ([place]) 08/21/2013  . Acute encephalopathy 08/21/2013  . Hyperkalemia 08/21/2013  . Lymphoma ([place]) 05/19/2013  . Shortness of breath 05/06/2013  . Atrial fibrillation with RVR ([place]) 05/06/2013  . Bilateral pneumonia 05/06/2013  . Acute renal insufficiency 05/06/2013  . Normocytic anemia 05/06/2013  . GERD (gastroesophageal reflux disease) 02/02/2012   Teena Irani, PTA/CLT [phone]  Teena Irani 10/24/2020, 11:16 AM  [place], [place], [address] Phone: [phone]   Fax:  [phone]  Name: Megan Mcknight MRN: [id number] Date of Birth: 1961/09/26

## 2020-10-30 ENCOUNTER — Encounter (HOSPITAL_COMMUNITY): Payer: Self-pay | Admitting: Physical Therapy

## 2020-10-30 ENCOUNTER — Other Ambulatory Visit: Payer: Self-pay

## 2020-10-30 ENCOUNTER — Ambulatory Visit (HOSPITAL_COMMUNITY): Payer: Medicare Other | Admitting: Physical Therapy

## 2020-10-30 DIAGNOSIS — M5441 Lumbago with sciatica, right side: Secondary | ICD-10-CM

## 2020-10-30 DIAGNOSIS — G8929 Other chronic pain: Secondary | ICD-10-CM | POA: Diagnosis not present

## 2020-10-30 DIAGNOSIS — R262 Difficulty in walking, not elsewhere classified: Secondary | ICD-10-CM | POA: Diagnosis not present

## 2020-10-30 DIAGNOSIS — M6281 Muscle weakness (generalized): Secondary | ICD-10-CM

## 2020-10-30 NOTE — Therapy (Addendum)
[place] [address] [place], [place], [address] Phone: [phone]   Fax:  [phone]  Physical Therapy Treatment and Progress Note  Patient Details  Name: Megan Mcknight MRN: [id number] Date of Birth: September 21, 1961 Referring Provider (PT):  Felisa Bonier   Progress Note Reporting Period 10/09/20 to 10/30/20  See note below for Objective Data and Assessment of Progress/Goals.        Encounter Date: 10/30/2020   PT End of Session - 10/30/20 0926    Visit Number 6    Number of Visits 12    Date for PT Re-Evaluation 12/04/20    Authorization Type Medicare    Progress Note Due on Visit 16    PT Start Time 0920    PT Stop Time 0958    PT Time Calculation (min) 38 min    Activity Tolerance Patient tolerated treatment well    Behavior During Therapy [facility] for tasks assessed/performed           Past Medical History:  Diagnosis Date  . Acute bronchitis   . Anaplastic large cell lymphoma 2014   remission since 2015  . Asthma   . Cancer ([place])    lung and kidney  . Colon polyps 02/2013   Per colonoscopy  . Coma ([place])   . Diabetes mellitus without complication ([place])   . Diverticulosis 02/2013   Per colonoscopy  . Dysrhythmia   . Former light tobacco smoker   . GERD (gastroesophageal reflux disease)   . H/O stem cell transplant ([place]) 2015  . Hyperlipidemia   . Nerve pain    Left leg  . Non Hodgkin's lymphoma ([place])   . Seasonal allergies     Past Surgical History:  Procedure Laterality Date  . bones removed from Baby toes    . BRONCHIAL BRUSHINGS  04/25/2020   Procedure: BRONCHIAL BRUSHINGS;  Surgeon: Rigoberto Noel, MD;  Location: [place];  Service: Cardiopulmonary;;  . BRONCHIAL WASHINGS  04/25/2020   Procedure: BRONCHIAL WASHINGS;  Surgeon: Rigoberto Noel, MD;  Location: [place];  Service: Cardiopulmonary;;  . COLONOSCOPY  02/16/2012   Procedure: COLONOSCOPY;  Surgeon: Daneil Dolin, MD;  [facility];  Service: Endoscopy;  Laterality: N/A;  1:30  . COLONOSCOPY N/A 03/26/2017   Procedure: COLONOSCOPY;  Surgeon: Daneil Dolin, MD;  [facility];  Service: Endoscopy;  Laterality: N/A;  930  . IR IMAGING GUIDED PORT INSERTION  12/19/2019  . LUNG BIOPSY  04/25/2020   Procedure: LUNG BIOPSY;  Surgeon: Rigoberto Noel, MD;  Location: [facility] ENDOSCOPY;  Service: Cardiopulmonary;;  . MEDIASTINOSCOPY N/A 05/13/2013   Procedure: MEDIASTINOSCOPY;  Surgeon: Melrose Nakayama, MD;  Location: [place];  Service: Thoracic;  Laterality: N/A;  . PARTIAL HYSTERECTOMY    . VAGINAL HYSTERECTOMY     partial hyst   . VIDEO BRONCHOSCOPY N/A 04/25/2020   Procedure: VIDEO BRONCHOSCOPY WITH FLUORO;  Surgeon: Rigoberto Noel, MD;  Location: [place];  Service: Cardiopulmonary;  Laterality: N/A;  . VIDEO BRONCHOSCOPY WITH ENDOBRONCHIAL ULTRASOUND N/A 05/13/2013   Procedure: VIDEO BRONCHOSCOPY WITH ENDOBRONCHIAL ULTRASOUND;  Surgeon: Melrose Nakayama, MD;  Location: [place];  Service: Thoracic;  Laterality: N/A;    There were no vitals filed for this visit.   Subjective Assessment - 10/30/20 0926    Subjective States she is not in pain currently however she washed her trunk on Thursday and she felt like she suffered the whole weekend.  States that she did not do her exercises because she was in pain. But overall feels better. Over the weekend she didn't want to get up because when she got up she could really feel it.    Currently in Pain? No/denies    Pain Score 0-No pain              [facility] PT Assessment - 10/30/20 0001      Assessment   Medical Diagnosis LBP secondary to pathological fracture at L5     Referring Provider (PT)  Felisa Bonier      Observation/Other Assessments   Focus on Therapeutic Outcomes (FOTO)  63% function    was 46% function      Strength   Right Hip Flexion 4/5   sore   Left Hip Flexion 4/5   sore    Right Knee Flexion 5/5    Right Knee Extension 5/5     Left Knee Flexion 5/5    Left Knee Extension 5/5                         [facility] Adult PT Treatment/Exercise - 10/30/20 0001      Lumbar Exercises: Standing   Other Standing Lumbar Exercises shoulder extension wtih dowel 3x10 5" holds      Lumbar Exercises: Sidelying   Clam Both;10 reps;2 seconds   3 sets each                 PT Education - 10/30/20 0952    Education Details on FOTO score, MMT results, overall benefits of new exercises. HEP.    Person(s) Educated Patient    Methods Explanation    Comprehension Verbalized understanding            PT Short Term Goals - 10/30/20 0936      PT SHORT TERM GOAL #1   Title Patient will be able to roll over in bed from supine to prone without pain or difficulties to demonstrate improved transitional mobility    Baseline able to perform with no pain or difficulty    Time 4    Period Weeks    Status Achieved    Target Date 11/06/20      PT SHORT TERM GOAL #2   Title Patient will report at least 25% improvement in overall symptoms and/or function to demonstrate improved functional mobility    Baseline 55-60% better    Time 4    Period Weeks    Status Achieved    Target Date 11/06/20      PT SHORT TERM GOAL #3   Title Patient will be independent in self management strategies to improve quality of life and functional outcomes.    Time 4    Period Weeks    Status On-going    Target Date 11/06/20             PT Long Term Goals - 10/30/20 0937      PT LONG TERM GOAL #1   Title Patient will be able to transition from sit to stand without use of upper extremities and without difficulty to demonstrate improved transitional mobility    Time 8    Period Weeks    Status Achieved      PT LONG TERM GOAL #2   Title Patient will improve on FOTO score to meet predicted outcomes to demonstrate improved functional mobility.    Time 8    Period Weeks  Status Achieved      PT LONG TERM GOAL #3   Title  Patient will report at least 50% improvement in overall symptoms and/or function to demonstrate improved functional mobility    Baseline 55-60% better    Time 8    Period Weeks    Status Achieved      PT LONG TERM GOAL #4   Title Patient will report at least 75% improvement in overall symptoms and/or function to demonstrate improved functional mobility    Time 5    Period Weeks    Status New    Target Date 12/04/20                 Plan - 10/30/20 2831    Clinical Impression Statement Performed FOTO and patient is making excellent progress. Patient has met 2/3 short term goals and all long term goals at this time. Added one additional long term goal to POC and sent certification off to MD for re-certification of new goal with POC. Patient still weak in legs and having some functional difficulties at home and in the community and would continue to benefit from skilled physical therapy at this time.    Personal Factors and Comorbidities Comorbidity 1;Comorbidity 2;Comorbidity 3+    Comorbidities lymphoma, hx of chemo, DB    Examination-Activity Limitations Bed Mobility;Bend;Squat;Stairs;Stand;Transfers;Locomotion Level;Lift;Sit    Examination-Participation Restrictions Cleaning;Community Activity;Meal Prep;Laundry;Shop    Stability/Clinical Decision Making Evolving/Moderate complexity    Rehab Potential Good    PT Frequency Other (comment)   1-2x/week for total of 12 visits over 8 week certification   PT Duration 8 weeks    PT Treatment/Interventions ADLs/Self Care Home Management;Aquatic Therapy;Cryotherapy;Moist Heat;Traction;Balance training;Therapeutic exercise;Therapeutic activities;Functional mobility training;Stair training;Gait training;Neuromuscular re-education;Patient/family education;Manual techniques;Joint Manipulations    PT Next Visit Plan LE strengthening and lumbar ROM - Progress to standing and functional strengthening.    PT Home Exercise Plan prone knee bends, bed  rolling; 10/28 SLR, bridges, TRA activation, hamstring curls, shoulder flexion with dowel; 11/6 shoulder extension with dowel, clamshell    Consulted and Agree with Plan of Care Patient           Patient will benefit from skilled therapeutic intervention in order to improve the following deficits and impairments:  Decreased endurance, Pain, Decreased strength, Decreased activity tolerance, Decreased balance, Decreased mobility, Difficulty walking, Decreased range of motion  Visit Diagnosis: Muscle weakness (generalized)  Difficulty in walking, not elsewhere classified  Chronic midline low back pain with right-sided sciatica     Problem List Patient Active Problem List   Diagnosis Date Noted  . Acute right-sided low back pain   . S/P bronchoscopy   . FUO (fever of unknown origin) 04/26/2020  . Lung nodules   . Sepsis due to undetermined organism ([place]) 04/22/2020  . Lobar pneumonia ([place]) 04/22/2020  . 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 Sepsis due to pneumonia ([place]) 04/21/2020  . Type 2 diabetes mellitus with hyperglycemia, without long-term current use of insulin ([place]) 02/20/2020  . Essential hypertension, benign 02/20/2020  . Mixed hyperlipidemia 02/20/2020  . Port-A-Cath in place 01/12/2020  . Trichomonal infection 02/17/2019  . PTTD (posterior tibial tendon dysfunction) 08/21/2015  . Transaminitis 05/31/2014  . Inguinal adenopathy 10/20/2013  . Breast mass, left 10/20/2013  . Neuropathy due to chemotherapeutic drug ([place]) 09/29/2013  . Tumor lysis syndrome 08/21/2013  . Hyperglycemia without ketosis 08/21/2013  . Acute renal failure ([place]) 08/21/2013  . Acute encephalopathy 08/21/2013  .  Hyperkalemia 08/21/2013  . Lymphoma ([place]) 05/19/2013  . Shortness of breath 05/06/2013  . Atrial fibrillation with RVR ([place]) 05/06/2013  . Bilateral pneumonia 05/06/2013  . Acute renal insufficiency 05/06/2013  . Normocytic anemia  05/06/2013  . GERD (gastroesophageal reflux disease) 02/02/2012   9:59 AM, 10/30/20 Jerene Pitch, DPT Physical Therapy with [facility]  [phone] office  [place] [address] [place], [place], [address] Phone: [phone]   Fax:  [phone]  Name: KAMELA BLANSETT MRN: [id number] Date of Birth: [birth year]

## 2020-11-01 ENCOUNTER — Other Ambulatory Visit: Payer: Self-pay

## 2020-11-01 ENCOUNTER — Ambulatory Visit (HOSPITAL_COMMUNITY): Payer: Medicare Other | Admitting: Physical Therapy

## 2020-11-01 DIAGNOSIS — R262 Difficulty in walking, not elsewhere classified: Secondary | ICD-10-CM

## 2020-11-01 DIAGNOSIS — G8929 Other chronic pain: Secondary | ICD-10-CM

## 2020-11-01 DIAGNOSIS — M5441 Lumbago with sciatica, right side: Secondary | ICD-10-CM | POA: Diagnosis not present

## 2020-11-01 DIAGNOSIS — M6281 Muscle weakness (generalized): Secondary | ICD-10-CM

## 2020-11-01 NOTE — Therapy (Signed)
[place], [place], [address] Phone: [phone]   Fax:  [phone]  Physical Therapy Treatment  Patient Details  Name: Megan Mcknight MRN: [id number] Date of Birth: [birth year] Referring Provider (PT):  Felisa Bonier   Encounter Date: 11/01/2020   PT End of Session - 11/01/20 1159    Visit Number 7    Number of Visits 12    Date for PT Re-Evaluation 12/04/20    Authorization Type Medicare    Progress Note Due on Visit 10    PT Start Time 1055    PT Stop Time 1137    PT Time Calculation (min) 42 min    Activity Tolerance Patient tolerated treatment well    Behavior During Therapy [facility] for tasks assessed/performed           Past Medical History:  Diagnosis Date  . Acute bronchitis   . Anaplastic large cell lymphoma 2014   remission since 2015  . Asthma   . Cancer ([place])    lung and kidney  . Colon polyps 02/2013   Per colonoscopy  . Coma ([place])   . Diabetes mellitus without complication ([place])   . Diverticulosis 02/2013   Per colonoscopy  . Dysrhythmia   . Former light tobacco smoker   . GERD (gastroesophageal reflux disease)   . H/O stem cell transplant ([place]) 2015  . Hyperlipidemia   . Nerve pain    Left leg  . Non Hodgkin's lymphoma ([place])   . Seasonal allergies     Past Surgical History:  Procedure Laterality Date  . bones removed from Baby toes    . BRONCHIAL BRUSHINGS  04/25/2020   Procedure: BRONCHIAL BRUSHINGS;  Surgeon: Rigoberto Noel, MD;  Location: [place];  Service: Cardiopulmonary;;  . BRONCHIAL WASHINGS  04/25/2020   Procedure: BRONCHIAL WASHINGS;  Surgeon: Rigoberto Noel, MD;  Location: [place];  Service: Cardiopulmonary;;  . COLONOSCOPY  02/16/2012   Procedure: COLONOSCOPY;  Surgeon: Daneil Dolin, MD;  [facility];  Service: Endoscopy;  Laterality: N/A;  1:30  . COLONOSCOPY N/A 03/26/2017   Procedure: COLONOSCOPY;  Surgeon: Daneil Dolin, MD;  [facility];  Service: Endoscopy;  Laterality: N/A;  930  . IR IMAGING GUIDED PORT INSERTION  12/19/2019  . LUNG BIOPSY  04/25/2020   Procedure: LUNG BIOPSY;  Surgeon: Rigoberto Noel, MD;  Location: [facility] ENDOSCOPY;  Service: Cardiopulmonary;;  . MEDIASTINOSCOPY N/A 05/13/2013   Procedure: MEDIASTINOSCOPY;  Surgeon: Melrose Nakayama, MD;  Location: [place];  Service: Thoracic;  Laterality: N/A;  . PARTIAL HYSTERECTOMY    . VAGINAL HYSTERECTOMY     partial hyst   . VIDEO BRONCHOSCOPY N/A 04/25/2020   Procedure: VIDEO BRONCHOSCOPY WITH FLUORO;  Surgeon: Rigoberto Noel, MD;  Location: [place];  Service: Cardiopulmonary;  Laterality: N/A;  . VIDEO BRONCHOSCOPY WITH ENDOBRONCHIAL ULTRASOUND N/A 05/13/2013   Procedure: VIDEO BRONCHOSCOPY WITH ENDOBRONCHIAL ULTRASOUND;  Surgeon: Melrose Nakayama, MD;  Location: [place];  Service: Thoracic;  Laterality: N/A;    There were no vitals filed for this visit.   Subjective Assessment - 11/01/20 1110    Subjective Pt reports no pain or issues today.    Currently in Pain? No/denies                             [facility] Adult PT Treatment/Exercise - 11/01/20 0001  Lumbar Exercises: Stretches   Active Hamstring Stretch Right;Left;2 reps;30 seconds    Active Hamstring Stretch Limitations standing    Other Lumbar Stretch Exercise Wall slides facing wall to stretch shoulder 10X, UE flexion back against wall 10X      Lumbar Exercises: Standing   Heel Raises 20 reps    Functional Squats 10 reps    Functional Squats Limitations in front of chair    Forward Lunge 10 reps;Limitations    Forward Lunge Limitations onto 4" box without UE    Side Lunge 10 reps;Limitations    Side Lunge Limitations onto 4" box wihtout UE    Scapular Retraction Both;10 reps;Theraband    Theraband Level (Scapular Retraction) Level 2 (Red)    Row Both;10 reps;Theraband    Theraband Level (Row) Level 2 (Red)    Shoulder Extension 10 reps;Theraband     Theraband Level (Shoulder Extension) Level 2 (Red)    Other Standing Lumbar Exercises hip abduction and extension 10X    Other Standing Lumbar Exercises tandem stance 30" each, palloff RTB 10X each, vectors 10X5" each with 1 HHA                    PT Short Term Goals - 10/30/20 0936      PT SHORT TERM GOAL #1   Title Patient will be able to roll over in bed from supine to prone without pain or difficulties to demonstrate improved transitional mobility    Baseline able to perform with no pain or difficulty    Time 4    Period Weeks    Status Achieved    Target Date 11/06/20      PT SHORT TERM GOAL #2   Title Patient will report at least 25% improvement in overall symptoms and/or function to demonstrate improved functional mobility    Baseline 55-60% better    Time 4    Period Weeks    Status Achieved    Target Date 11/06/20      PT SHORT TERM GOAL #3   Title Patient will be independent in self management strategies to improve quality of life and functional outcomes.    Time 4    Period Weeks    Status On-going    Target Date 11/06/20             PT Long Term Goals - 10/30/20 0937      PT LONG TERM GOAL #1   Title Patient will be able to transition from sit to stand without use of upper extremities and without difficulty to demonstrate improved transitional mobility    Time 8    Period Weeks    Status Achieved      PT LONG TERM GOAL #2   Title Patient will improve on FOTO score to meet predicted outcomes to demonstrate improved functional mobility.    Time 8    Period Weeks    Status Achieved      PT LONG TERM GOAL #3   Title Patient will report at least 50% improvement in overall symptoms and/or function to demonstrate improved functional mobility    Baseline 55-60% better    Time 8    Period Weeks    Status Achieved      PT LONG TERM GOAL #4   Title Patient will report at least 75% improvement in overall symptoms and/or function to demonstrate  improved functional mobility    Time 5    Period Weeks    Status  New    Target Date 12/04/20                 Plan - 11/01/20 1158    Clinical Impression Statement Progressed to standing LE functional and postural strengthening exercises today.  Added UE flexion stretch against wall to further improve Lt shoulder AROM.  Pt pleased with results from stretches.  Overall with good form with minimal cues needed.  Pt without any increased pain or limitations with any new exercises added today.  Able to maintain tandem stance of 30", however unstable.  Added vectors to help improve LE stability.    Personal Factors and Comorbidities Comorbidity 1;Comorbidity 2;Comorbidity 3+    Comorbidities lymphoma, hx of chemo, DB    Examination-Activity Limitations Bed Mobility;Bend;Squat;Stairs;Stand;Transfers;Locomotion Level;Lift;Sit    Examination-Participation Restrictions Cleaning;Community Activity;Meal Prep;Laundry;Shop    Stability/Clinical Decision Making Evolving/Moderate complexity    Rehab Potential Good    PT Frequency Other (comment)   1-2x/week for total of 12 visits over 8 week certification   PT Duration 8 weeks    PT Treatment/Interventions ADLs/Self Care Home Management;Aquatic Therapy;Cryotherapy;Moist Heat;Traction;Balance training;Therapeutic exercise;Therapeutic activities;Functional mobility training;Stair training;Gait training;Neuromuscular re-education;Patient/family education;Manual techniques;Joint Manipulations    PT Next Visit Plan LE strengthening and lumbar ROM .  Continue to progress standing and functional strengthening.    PT Home Exercise Plan prone knee bends, bed rolling; 10/28 SLR, bridges, TRA activation, hamstring curls, shoulder flexion with dowel; 11/6 shoulder extension with dowel, clamshell    Consulted and Agree with Plan of Care Patient           Patient will benefit from skilled therapeutic intervention in order to improve the following deficits and  impairments:  Decreased endurance, Pain, Decreased strength, Decreased activity tolerance, Decreased balance, Decreased mobility, Difficulty walking, Decreased range of motion  Visit Diagnosis: Muscle weakness (generalized)  Difficulty in walking, not elsewhere classified  Chronic midline low back pain with right-sided sciatica     Problem List Patient Active Problem List   Diagnosis Date Noted  . Acute right-sided low back pain   . S/P bronchoscopy   . FUO (fever of unknown origin) 04/26/2020  . Lung nodules   . Sepsis due to undetermined organism ([place]) 04/22/2020  . Lobar pneumonia ([place]) 04/22/2020  . 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 Sepsis due to pneumonia ([place]) 04/21/2020  . Type 2 diabetes mellitus with hyperglycemia, without long-term current use of insulin ([place]) 02/20/2020  . Essential hypertension, benign 02/20/2020  . Mixed hyperlipidemia 02/20/2020  . Port-A-Cath in place 01/12/2020  . Trichomonal infection 02/17/2019  . PTTD (posterior tibial tendon dysfunction) 08/21/2015  . Transaminitis 05/31/2014  . Inguinal adenopathy 10/20/2013  . Breast mass, left 10/20/2013  . Neuropathy due to chemotherapeutic drug ([place]) 09/29/2013  . Tumor lysis syndrome 08/21/2013  . Hyperglycemia without ketosis 08/21/2013  . Acute renal failure ([place]) 08/21/2013  . Acute encephalopathy 08/21/2013  . Hyperkalemia 08/21/2013  . Lymphoma ([place]) 05/19/2013  . Shortness of breath 05/06/2013  . Atrial fibrillation with RVR ([place]) 05/06/2013  . Bilateral pneumonia 05/06/2013  . Acute renal insufficiency 05/06/2013  . Normocytic anemia 05/06/2013  . GERD (gastroesophageal reflux disease) 02/02/2012   Teena Irani, PTA/CLT [phone]  Teena Irani 11/01/2020, 12:02 PM  [place], [place], [address] Phone: [phone]   Fax:  [phone]  Name: Megan Mcknight MRN: [id number] Date of Birth: 26-Aug-1961

## 2020-11-05 ENCOUNTER — Encounter (HOSPITAL_COMMUNITY): Payer: Self-pay | Admitting: Physical Therapy

## 2020-11-05 ENCOUNTER — Ambulatory Visit (HOSPITAL_COMMUNITY): Payer: Medicare Other | Admitting: Physical Therapy

## 2020-11-05 ENCOUNTER — Other Ambulatory Visit: Payer: Self-pay

## 2020-11-05 DIAGNOSIS — M5441 Lumbago with sciatica, right side: Secondary | ICD-10-CM

## 2020-11-05 DIAGNOSIS — G8929 Other chronic pain: Secondary | ICD-10-CM

## 2020-11-05 DIAGNOSIS — M6281 Muscle weakness (generalized): Secondary | ICD-10-CM

## 2020-11-05 DIAGNOSIS — R262 Difficulty in walking, not elsewhere classified: Secondary | ICD-10-CM | POA: Diagnosis not present

## 2020-11-05 DIAGNOSIS — E1165 Type 2 diabetes mellitus with hyperglycemia: Secondary | ICD-10-CM | POA: Diagnosis not present

## 2020-11-05 NOTE — Therapy (Signed)
[place], [place], [address] Phone: [phone]   Fax:  [phone]  Physical Therapy Treatment  Mcknight Details  Name: Megan Mcknight MRN: [id number] Date of Birth: [birth year] Referring Provider (PT):  Felisa Bonier   Encounter Date: 11/05/2020   PT End of Session - 11/05/20 0836    Visit Number 8    Number of Visits 12    Date for PT Re-Evaluation 12/04/20    Authorization Type Medicare    Progress Note Due on Visit 10    PT Start Time 0833    PT Stop Time 0912    PT Time Calculation (min) 39 min    Activity Tolerance Mcknight tolerated treatment well    Behavior During Therapy [facility] for tasks assessed/performed           Past Medical History:  Diagnosis Date  . Acute bronchitis   . Anaplastic large cell lymphoma 2014   remission since 2015  . Asthma   . Cancer ([place])    lung and kidney  . Colon polyps 02/2013   Per colonoscopy  . Coma ([place])   . Diabetes mellitus without complication ([place])   . Diverticulosis 02/2013   Per colonoscopy  . Dysrhythmia   . Former light tobacco smoker   . GERD (gastroesophageal reflux disease)   . H/O stem cell transplant ([place]) 2015  . Hyperlipidemia   . Nerve pain    Left leg  . Non Hodgkin's lymphoma ([place])   . Seasonal allergies     Past Surgical History:  Procedure Laterality Date  . bones removed from Baby toes    . BRONCHIAL BRUSHINGS  04/25/2020   Procedure: BRONCHIAL BRUSHINGS;  Surgeon: Rigoberto Noel, MD;  Location: [place];  Service: Cardiopulmonary;;  . BRONCHIAL WASHINGS  04/25/2020   Procedure: BRONCHIAL WASHINGS;  Surgeon: Rigoberto Noel, MD;  Location: [place];  Service: Cardiopulmonary;;  . COLONOSCOPY  02/16/2012   Procedure: COLONOSCOPY;  Surgeon: Daneil Dolin, MD;  [facility];  Service: Endoscopy;  Laterality: N/A;  1:30  . COLONOSCOPY N/A 03/26/2017   Procedure: COLONOSCOPY;  Surgeon: Daneil Dolin, MD;  [facility];  Service: Endoscopy;  Laterality: N/A;  930  . IR IMAGING GUIDED PORT INSERTION  12/19/2019  . LUNG BIOPSY  04/25/2020   Procedure: LUNG BIOPSY;  Surgeon: Rigoberto Noel, MD;  Location: [facility] ENDOSCOPY;  Service: Cardiopulmonary;;  . MEDIASTINOSCOPY N/A 05/13/2013   Procedure: MEDIASTINOSCOPY;  Surgeon: Melrose Nakayama, MD;  Location: [place];  Service: Thoracic;  Laterality: N/A;  . PARTIAL HYSTERECTOMY    . VAGINAL HYSTERECTOMY     partial hyst   . VIDEO BRONCHOSCOPY N/A 04/25/2020   Procedure: VIDEO BRONCHOSCOPY WITH FLUORO;  Surgeon: Rigoberto Noel, MD;  Location: [place];  Service: Cardiopulmonary;  Laterality: N/A;  . VIDEO BRONCHOSCOPY WITH ENDOBRONCHIAL ULTRASOUND N/A 05/13/2013   Procedure: VIDEO BRONCHOSCOPY WITH ENDOBRONCHIAL ULTRASOUND;  Surgeon: Melrose Nakayama, MD;  Location: [place];  Service: Thoracic;  Laterality: N/A;    There were no vitals filed for this visit.   Subjective Assessment - 11/05/20 0842    Subjective Mcknight reports she is feeling good. States her exercises are helping. Reports not complaints on this date.    Currently in Pain? No/denies              [facility] PT Assessment - 11/05/20 0001      Assessment  Medical Diagnosis LBP secondary to pathological fracture at L5     Referring Provider (PT)  Felisa Bonier                         [facility] Adult PT Treatment/Exercise - 11/05/20 0001      Ambulation/Gait   Stairs Yes    Stair Management Technique One rail Right    Number of Stairs 4    Height of Stairs 7    Gait Comments 3x5 with 8# dB in hand       Lumbar Exercises: Standing   Other Standing Lumbar Exercises lat pull downs red Theraband 3x12; lateral steps in mini squat x8 B     Other Standing Lumbar Exercises lateral steps with RTB anchored - 2x5 B; chair pose 3x5 5" holds                     PT Short Term Goals - 10/30/20 0936      PT SHORT TERM GOAL #1   Title Mcknight will be able to  roll over in bed from supine to prone without pain or difficulties to demonstrate improved transitional mobility    Baseline able to perform with no pain or difficulty    Time 4    Period Weeks    Status Achieved    Target Date 11/06/20      PT SHORT TERM GOAL #2   Title Mcknight will report at least 25% improvement in overall symptoms and/or function to demonstrate improved functional mobility    Baseline 55-60% better    Time 4    Period Weeks    Status Achieved    Target Date 11/06/20      PT SHORT TERM GOAL #3   Title Mcknight will be independent in self management strategies to improve quality of life and functional outcomes.    Time 4    Period Weeks    Status On-going    Target Date 11/06/20             PT Long Term Goals - 10/30/20 0937      PT LONG TERM GOAL #1   Title Mcknight will be able to transition from sit to stand without use of upper extremities and without difficulty to demonstrate improved transitional mobility    Time 8    Period Weeks    Status Achieved      PT LONG TERM GOAL #2   Title Mcknight will improve on FOTO score to meet predicted outcomes to demonstrate improved functional mobility.    Time 8    Period Weeks    Status Achieved      PT LONG TERM GOAL #3   Title Mcknight will report at least 50% improvement in overall symptoms and/or function to demonstrate improved functional mobility    Baseline 55-60% better    Time 8    Period Weeks    Status Achieved      PT LONG TERM GOAL #4   Title Mcknight will report at least 75% improvement in overall symptoms and/or function to demonstrate improved functional mobility    Time 5    Period Weeks    Status New    Target Date 12/04/20                 Plan - 11/05/20 7741    Clinical Impression Statement Continued focus on upper and lower body strengthening. Tolerated all exercises well.  Fatigue noted with exercises but no complaints of pain. Added chair pose and lateral stepping to HEP.  Mcknight is progressing very well and will continue to benefit from skilled physical therapy at this time.    Personal Factors and Comorbidities Comorbidity 1;Comorbidity 2;Comorbidity 3+    Comorbidities lymphoma, hx of chemo, DB    Examination-Activity Limitations Bed Mobility;Bend;Squat;Stairs;Stand;Transfers;Locomotion Level;Lift;Sit    Examination-Participation Restrictions Cleaning;Community Activity;Meal Prep;Laundry;Shop    Stability/Clinical Decision Making Evolving/Moderate complexity    Rehab Potential Good    PT Frequency Other (comment)   1-2x/week for total of 12 visits over 8 week certification   PT Duration 8 weeks    PT Treatment/Interventions ADLs/Self Care Home Management;Aquatic Therapy;Cryotherapy;Moist Heat;Traction;Balance training;Therapeutic exercise;Therapeutic activities;Functional mobility training;Stair training;Gait training;Neuromuscular re-education;Mcknight/family education;Manual techniques;Joint Manipulations    PT Next Visit Plan LE strengthening and lumbar ROM .  Continue to progress standing and functional strengthening.    PT Home Exercise Plan prone knee bends, bed rolling; 10/28 SLR, bridges, TRA activation, hamstring curls, shoulder flexion with dowel; 11/6 shoulder extension with dowel, clamshell; 11/22 chair pose, lateral stepping    Consulted and Agree with Plan of Care Mcknight           Mcknight will benefit from skilled therapeutic intervention in order to improve the following deficits and impairments:  Decreased endurance, Pain, Decreased strength, Decreased activity tolerance, Decreased balance, Decreased mobility, Difficulty walking, Decreased range of motion  Visit Diagnosis: Muscle weakness (generalized)  Difficulty in walking, not elsewhere classified  Chronic midline low back pain with right-sided sciatica     Problem List Mcknight Active Problem List   Diagnosis Date Noted  . Acute right-sided low back pain   . S/P bronchoscopy     . FUO (fever of unknown origin) 04/26/2020  . Lung nodules   . Sepsis due to undetermined organism ([place]) 04/22/2020  . Lobar pneumonia ([place]) 04/22/2020  . 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 Sepsis due to pneumonia ([place]) 04/21/2020  . Type 2 diabetes mellitus with hyperglycemia, without long-term current use of insulin ([place]) 02/20/2020  . Essential hypertension, benign 02/20/2020  . Mixed hyperlipidemia 02/20/2020  . Port-A-Cath in place 01/12/2020  . Trichomonal infection 02/17/2019  . PTTD (posterior tibial tendon dysfunction) 08/21/2015  . Transaminitis 05/31/2014  . Inguinal adenopathy 10/20/2013  . Breast mass, left 10/20/2013  . Neuropathy due to chemotherapeutic drug ([place]) 09/29/2013  . Tumor lysis syndrome 08/21/2013  . Hyperglycemia without ketosis 08/21/2013  . Acute renal failure ([place]) 08/21/2013  . Acute encephalopathy 08/21/2013  . Hyperkalemia 08/21/2013  . Lymphoma ([place]) 05/19/2013  . Shortness of breath 05/06/2013  . Atrial fibrillation with RVR ([place]) 05/06/2013  . Bilateral pneumonia 05/06/2013  . Acute renal insufficiency 05/06/2013  . Normocytic anemia 05/06/2013  . GERD (gastroesophageal reflux disease) 02/02/2012   9:13 AM, 11/05/20 Jerene Pitch, DPT Physical Therapy with [facility]  [phone] office  [place] [address] [place], [place], [address] Phone: [phone]   Fax:  [phone]  Name: Megan Mcknight MRN: [id number] Date of Birth: February 18, 1961

## 2020-11-06 ENCOUNTER — Ambulatory Visit (HOSPITAL_COMMUNITY): Payer: Medicare Other | Admitting: Physical Therapy

## 2020-11-06 ENCOUNTER — Encounter (HOSPITAL_COMMUNITY): Payer: Medicare Other | Admitting: Physical Therapy

## 2020-11-06 DIAGNOSIS — M6281 Muscle weakness (generalized): Secondary | ICD-10-CM | POA: Diagnosis not present

## 2020-11-06 DIAGNOSIS — G8929 Other chronic pain: Secondary | ICD-10-CM | POA: Diagnosis not present

## 2020-11-06 DIAGNOSIS — M5441 Lumbago with sciatica, right side: Secondary | ICD-10-CM | POA: Diagnosis not present

## 2020-11-06 DIAGNOSIS — R262 Difficulty in walking, not elsewhere classified: Secondary | ICD-10-CM | POA: Diagnosis not present

## 2020-11-06 LAB — COMPREHENSIVE METABOLIC PANEL
ALT: 17 IU/L (ref 0–32)
AST: 19 IU/L (ref 0–40)
Albumin/Globulin Ratio: 1.7 (ref 1.2–2.2)
Albumin: 4.5 g/dL (ref 3.8–4.9)
Alkaline Phosphatase: 86 IU/L (ref 44–121)
BUN/Creatinine Ratio: 13 (ref 9–23)
BUN: 14 mg/dL (ref 6–24)
Bilirubin Total: 0.4 mg/dL (ref 0.0–1.2)
CO2: 23 mmol/L (ref 20–29)
Calcium: 10.1 mg/dL (ref 8.7–10.2)
Chloride: 105 mmol/L (ref 96–106)
Creatinine, Ser: 1.07 mg/dL — ABNORMAL HIGH (ref 0.57–1.00)
GFR calc Af Amer: 66 mL/min/{1.73_m2} (ref >59)
GFR calc non Af Amer: 57 mL/min/{1.73_m2} — ABNORMAL LOW (ref >59)
Globulin, Total: 2.7 g/dL (ref 1.5–4.5)
Glucose: 102 mg/dL — ABNORMAL HIGH (ref 65–99)
Potassium: 4.3 mmol/L (ref 3.5–5.2)
Sodium: 144 mmol/L (ref 134–144)
Total Protein: 7.2 g/dL (ref 6.0–8.5)

## 2020-11-06 LAB — LIPID PANEL
Chol/HDL Ratio: 3.7 ratio (ref 0.0–4.4)
Cholesterol, Total: 188 mg/dL (ref 100–199)
HDL: 51 mg/dL (ref >39)
LDL Chol Calc (NIH): 90 mg/dL (ref 0–99)
Triglycerides: 281 mg/dL — ABNORMAL HIGH (ref 0–149)
VLDL Cholesterol Cal: 47 mg/dL — ABNORMAL HIGH (ref 5–40)

## 2020-11-06 LAB — TSH: TSH: 3.77 u[IU]/mL (ref 0.450–4.500)

## 2020-11-06 LAB — T4, FREE: Free T4: 0.79 ng/dL — ABNORMAL LOW (ref 0.82–1.77)

## 2020-11-06 NOTE — Therapy (Signed)
[place], [place], [address] Phone: [phone]   Fax:  [phone]  Physical Therapy Treatment  Patient Details  Name: Megan Mcknight MRN: [id number] Date of Birth: 1961-11-13 Referring Provider (PT):  Felisa Bonier   Encounter Date: 11/06/2020   PT End of Session - 11/06/20 1509    Visit Number 9    Number of Visits 12    Date for PT Re-Evaluation 12/04/20    Authorization Type Medicare    Progress Note Due on Visit 10    PT Start Time 1450    PT Stop Time 1530    PT Time Calculation (min) 40 min    Activity Tolerance Patient tolerated treatment well    Behavior During Therapy [facility] for tasks assessed/performed           Past Medical History:  Diagnosis Date  . Acute bronchitis   . Anaplastic large cell lymphoma 2014   remission since 2015  . Asthma   . Cancer ([place])    lung and kidney  . Colon polyps 02/2013   Per colonoscopy  . Coma ([place])   . Diabetes mellitus without complication ([place])   . Diverticulosis 02/2013   Per colonoscopy  . Dysrhythmia   . Former light tobacco smoker   . GERD (gastroesophageal reflux disease)   . H/O stem cell transplant ([place]) 2015  . Hyperlipidemia   . Nerve pain    Left leg  . Non Hodgkin's lymphoma ([place])   . Seasonal allergies     Past Surgical History:  Procedure Laterality Date  . bones removed from Baby toes    . BRONCHIAL BRUSHINGS  04/25/2020   Procedure: BRONCHIAL BRUSHINGS;  Surgeon: Rigoberto Noel, MD;  Location: [facility];  Service: Cardiopulmonary;;  . BRONCHIAL WASHINGS  04/25/2020   Procedure: BRONCHIAL WASHINGS;  Surgeon: Rigoberto Noel, MD;  Location: [facility];  Service: Cardiopulmonary;;  . COLONOSCOPY  02/16/2012   Procedure: COLONOSCOPY;  Surgeon: Daneil Dolin, MD;  [facility];  Service: Endoscopy;  Laterality: N/A;  1:30  . COLONOSCOPY N/A 03/26/2017   Procedure: COLONOSCOPY;  Surgeon: Daneil Dolin, MD;  [facility];  Service: Endoscopy;  Laterality: N/A;  930  . IR IMAGING GUIDED PORT INSERTION  12/19/2019  . LUNG BIOPSY  04/25/2020   Procedure: LUNG BIOPSY;  Surgeon: Rigoberto Noel, MD;  Location: [facility] END[facility]COPY;  Service: Cardiopulmonary;;  . MEDIASTINOSCOPY N/A 05/13/2013   Procedure: MEDIASTINOSCOPY;  Surgeon: Melrose Nakayama, MD;  Location: [facility];  Service: Thoracic;  Laterality: N/A;  . PARTIAL HYSTERECTOMY    . VAGINAL HYSTERECTOMY     partial hyst   . VIDEO BRONCHOSCOPY N/A 04/25/2020   Procedure: VIDEO BRONCHOSCOPY WITH FLUORO;  Surgeon: Rigoberto Noel, MD;  Location: [place];  Service: Cardiopulmonary;  Laterality: N/A;  . VIDEO BRONCHOSCOPY WITH ENDOBRONCHIAL ULTRASOUND N/A 05/13/2013   Procedure: VIDEO BRONCHOSCOPY WITH ENDOBRONCHIAL ULTRASOUND;  Surgeon: Melrose Nakayama, MD;  Location: [facility];  Service: Thoracic;  Laterality: N/A;    There were no vitals filed for this visit.   Subjective Assessment - 11/06/20 1451    Subjective Pt states that her back is painfree today.    Patient Stated Goals to be able to return to her exercise program and to be stronger    Currently in Pain? No/denies    Pain Onset More than a month ago  [place] Adult PT Treatment/Exercise - 11/06/20 0001      Lumbar Exercises: Standing   Forward Lunge 15 reps    Forward Lunge Limitations onto 4" box without UE    Side Lunge 15 reps    Side Lunge Limitations 2# wt in hands w/elbow flexion     Scapular Retraction Strengthening;Both;15 reps;Theraband    Theraband Level (Scapular Retraction) Level 3 (Green)    Row Both;15 reps;Theraband    Theraband Level (Row) Level 3 (Green)    Shoulder Extension Both;15 reps;Theraband    Other Standing Lumbar Exercises Single leg stance x 3 each, hip excursion x 3, lat pull downs greenTheraband 20;     Other Standing Lumbar Exercises Paloff x 15; side step x 2 RT                     PT Short Term  Goals - 10/30/20 0936      PT SHORT TERM GOAL #1   Title Patient will be able to roll over in bed from supine to prone without pain or difficulties to demonstrate improved transitional mobility    Baseline able to perform with no pain or difficulty    Time 4    Period Weeks    Status Achieved    Target Date 11/06/20      PT SHORT TERM GOAL #2   Title Patient will report at least 25% improvement in overall symptoms and/or function to demonstrate improved functional mobility    Baseline 55-60% better    Time 4    Period Weeks    Status Achieved    Target Date 11/06/20      PT SHORT TERM GOAL #3   Title Patient will be independent in self management strategies to improve quality of life and functional outcomes.    Time 4    Period Weeks    Status On-going    Target Date 11/06/20             PT Long Term Goals - 10/30/20 0937      PT LONG TERM GOAL #1   Title Patient will be able to transition from sit to stand without use of upper extremities and without difficulty to demonstrate improved transitional mobility    Time 8    Period Weeks    Status Achieved      PT LONG TERM GOAL #2   Title Patient will improve on FOTO score to meet predicted outcomes to demonstrate improved functional mobility.    Time 8    Period Weeks    Status Achieved      PT LONG TERM GOAL #3   Title Patient will report at least 50% improvement in overall symptoms and/or function to demonstrate improved functional mobility    Baseline 55-60% better    Time 8    Period Weeks    Status Achieved      PT LONG TERM GOAL #4   Title Patient will report at least 75% improvement in overall symptoms and/or function to demonstrate improved functional mobility    Time 5    Period Weeks    Status New    Target Date 12/04/20                 Plan - 11/06/20 1509    Clinical Impression Statement Added Paloff, single leg stance,  sidestepping in functional squat and elbow flexion with sidelunging to  focus on core and LE strength.  PT able to  complete all exercises in good formation with verbal cuing    Personal Factors and Comorbidities Comorbidity 1;Comorbidity 2;Comorbidity 3+    Comorbidities lymphoma, hx of chemo, DB    Examination-Activity Limitations Bed Mobility;Bend;Squat;Stairs;Stand;Transfers;Locomotion Level;Lift;Sit    Examination-Participation Restrictions Cleaning;Community Activity;Meal Prep;Laundry;Shop    Stability/Clinical Decision Making Evolving/Moderate complexity    Rehab Potential Good    PT Frequency Other (comment)   1-2x/week for total of 12 visits over 8 week certification   PT Duration 8 weeks    PT Treatment/Interventions ADLs/Self Care Home Management;Aquatic Therapy;Cryotherapy;Moist Heat;Traction;Balance training;Therapeutic exercise;Therapeutic activities;Functional mobility training;Stair training;Gait training;Neuromuscular re-education;Patient/family education;Manual techniques;Joint Manipulations    PT Next Visit Plan LE strengthening and lumbar ROM .  Continue to progress standing and functional strengthening.  Give Tband for home use    PT Home Exercise Plan prone knee bends, bed rolling; 10/28 SLR, bridges, TRA activation, hamstring curls, shoulder flexion with dowel; 11/6 shoulder extension with dowel, clamshell; 11/22 chair pose, lateral stepping    Consulted and Agree with Plan of Care Patient           Patient will benefit from skilled therapeutic intervention in order to improve the following deficits and impairments:  Decreased endurance, Pain, Decreased strength, Decreased activity tolerance, Decreased balance, Decreased mobility, Difficulty walking, Decreased range of motion  Visit Diagnosis: Muscle weakness (generalized)  Difficulty in walking, not elsewhere classified  Chronic midline low back pain with right-sided sciatica     Problem List Patient Active Problem List   Diagnosis Date Noted  . Acute right-sided low back pain   .  S/P bronchoscopy   . FUO (fever of unknown origin) 04/26/2020  . Lung nodules   . Sepsis due to undetermined organism ([place]) 04/22/2020  . Lobar pneumonia ([place]) 04/22/2020  . 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[place]) 04/22/2020  . Sepsis due to pneumonia ([place]) 04/21/2020  . Type 2 diabetes mellitus with hyperglycemia, without long-term current use of insulin ([place]) 02/20/2020  . Essential hypertension, benign 02/20/2020  . Mixed hyperlipidemia 02/20/2020  . Port-A-Cath in place 01/12/2020  . Trichomonal infection 02/17/2019  . PTTD (posterior tibial tendon dysfunction) 08/21/2015  . Transaminitis 05/31/2014  . Inguinal adenopathy 10/20/2013  . Breast mass, left 10/20/2013  . Neuropathy due to chemotherapeutic drug ([place]) 09/29/2013  . Tumor lysis syndrome 08/21/2013  . Hyperglycemia without ketosis 08/21/2013  . Acute renal failure ([place]) 08/21/2013  . Acute encephalopathy 08/21/2013  . Hyperkalemia 08/21/2013  . Lymphoma ([place]) 05/19/2013  . Shortness of breath 05/06/2013  . Atrial fibrillation with RVR ([place]) 05/06/2013  . Bilateral pneumonia 05/06/2013  . Acute renal insufficiency 05/06/2013  . Normocytic anemia 05/06/2013  . GERD (gastroesophageal reflux disease) 02/02/2012   Rayetta Humphrey, PT CLT [phone] 11/06/2020, 3:36 PM  [place] [address] [place], [place], [address] Phone: [phone]   Fax:  [phone]  Name: Megan Mcknight MRN: [id number] Date of Birth: June 02, 1961

## 2020-11-07 ENCOUNTER — Telehealth (HOSPITAL_COMMUNITY): Payer: Self-pay | Admitting: Physical Therapy

## 2020-11-07 NOTE — Telephone Encounter (Signed)
Please ask pt to r/s 12/9 - AF will be out of the office.

## 2020-11-12 ENCOUNTER — Other Ambulatory Visit: Payer: Self-pay

## 2020-11-12 ENCOUNTER — Ambulatory Visit (HOSPITAL_COMMUNITY): Payer: Medicare Other | Admitting: Physical Therapy

## 2020-11-12 DIAGNOSIS — R262 Difficulty in walking, not elsewhere classified: Secondary | ICD-10-CM | POA: Diagnosis not present

## 2020-11-12 DIAGNOSIS — G8929 Other chronic pain: Secondary | ICD-10-CM | POA: Diagnosis not present

## 2020-11-12 DIAGNOSIS — M5441 Lumbago with sciatica, right side: Secondary | ICD-10-CM | POA: Diagnosis not present

## 2020-11-12 DIAGNOSIS — M6281 Muscle weakness (generalized): Secondary | ICD-10-CM

## 2020-11-12 NOTE — Therapy (Addendum)
[place], [place], [address] Phone: [phone]   Fax:  [phone]  Physical Therapy Treatment  Patient Details  Name: Megan Mcknight MRN: [id number] Date of Birth: [birth year] Referring Provider (PT):  Felisa Bonier   Encounter Date: 11/12/2020   PT End of Session - 11/12/20 0919    Visit Number 10   PN completed visit #6   Number of Visits 12    Date for PT Re-Evaluation 12/04/20    Authorization Type Medicare    Progress Note Due on Visit 16    PT Start Time 0845    PT Stop Time 0916    PT Time Calculation (min) 31 min    Activity Tolerance Patient tolerated treatment well    Behavior During Therapy [facility] for tasks assessed/performed           Past Medical History:  Diagnosis Date  . Acute bronchitis   . Anaplastic large cell lymphoma 2014   remission since 2015  . Asthma   . Cancer ([place])    lung and kidney  . Colon polyps 02/2013   Per colonoscopy  . Coma ([place])   . Diabetes mellitus without complication ([place])   . Diverticulosis 02/2013   Per colonoscopy  . Dysrhythmia   . Former light tobacco smoker   . GERD (gastroesophageal reflux disease)   . H/O stem cell transplant ([place]) 2015  . Hyperlipidemia   . Nerve pain    Left leg  . Non Hodgkin's lymphoma ([place])   . Seasonal allergies     Past Surgical History:  Procedure Laterality Date  . bones removed from Baby toes    . BRONCHIAL BRUSHINGS  04/25/2020   Procedure: BRONCHIAL BRUSHINGS;  Surgeon: Rigoberto Noel, MD;  Location: [place];  Service: Cardiopulmonary;;  . BRONCHIAL WASHINGS  04/25/2020   Procedure: BRONCHIAL WASHINGS;  Surgeon: Rigoberto Noel, MD;  Location: [place];  Service: Cardiopulmonary;;  . COLONOSCOPY  02/16/2012   Procedure: COLONOSCOPY;  Surgeon: Daneil Dolin, MD;  [facility];  Service: Endoscopy;  Laterality: N/A;  1:30  . COLONOSCOPY N/A 03/26/2017   Procedure: COLONOSCOPY;  Surgeon: Daneil Dolin, MD;  [facility];  Service: Endoscopy;  Laterality: N/A;  930  . IR IMAGING GUIDED PORT INSERTION  12/19/2019  . LUNG BIOPSY  04/25/2020   Procedure: LUNG BIOPSY;  Surgeon: Rigoberto Noel, MD;  Location: [facility] ENDOSCOPY;  Service: Cardiopulmonary;;  . MEDIASTINOSCOPY N/A 05/13/2013   Procedure: MEDIASTINOSCOPY;  Surgeon: Melrose Nakayama, MD;  Location: [place];  Service: Thoracic;  Laterality: N/A;  . PARTIAL HYSTERECTOMY    . VAGINAL HYSTERECTOMY     partial hyst   . VIDEO BRONCHOSCOPY N/A 04/25/2020   Procedure: VIDEO BRONCHOSCOPY WITH FLUORO;  Surgeon: Rigoberto Noel, MD;  Location: [place];  Service: Cardiopulmonary;  Laterality: N/A;  . VIDEO BRONCHOSCOPY WITH ENDOBRONCHIAL ULTRASOUND N/A 05/13/2013   Procedure: VIDEO BRONCHOSCOPY WITH ENDOBRONCHIAL ULTRASOUND;  Surgeon: Melrose Nakayama, MD;  Location: [place];  Service: Thoracic;  Laterality: N/A;    There were no vitals filed for this visit.   Subjective Assessment - 11/12/20 0845    Subjective Pt was 10 minutes late for appt then was out with scheduing; 15 minutes taken from treatment.  States she is doing well today overall.    Pain Score 0-No pain  [place] Adult PT Treatment/Exercise - 11/12/20 0001      Lumbar Exercises: Standing   Forward Lunge 15 reps    Forward Lunge Limitations onto 4" box with 2# DB bicep curls UE; 2 sets    Side Lunge 15 reps    Side Lunge Limitations onto 4" box with 2# DB UE lateral flexion; 2 sets    Scapular Retraction Strengthening;Both;15 reps;Theraband    Theraband Level (Scapular Retraction) Level 3 (Green)    Scapular Retraction Limitations 2 sets    Row Both;15 reps;Theraband    Theraband Level (Row) Level 3 (Green)    Row Limitations 2 sets    Shoulder Extension Both;15 reps;Theraband    Theraband Level (Shoulder Extension) Level 3 (Green)    Shoulder Extension Limitations 2 sets    Other Standing Lumbar Exercises  Paloff x 15 GTB; side step x 2 RT with GTB      Lumbar Exercises: Seated   Sit to Stand 15 reps;Limitations    Sit to Stand Limitations 2 sets no UE's                    PT Short Term Goals - 10/30/20 0936      PT SHORT TERM GOAL #1   Title Patient will be able to roll over in bed from supine to prone without pain or difficulties to demonstrate improved transitional mobility    Baseline able to perform with no pain or difficulty    Time 4    Period Weeks    Status Achieved    Target Date 11/06/20      PT SHORT TERM GOAL #2   Title Patient will report at least 25% improvement in overall symptoms and/or function to demonstrate improved functional mobility    Baseline 55-60% better    Time 4    Period Weeks    Status Achieved    Target Date 11/06/20      PT SHORT TERM GOAL #3   Title Patient will be independent in self management strategies to improve quality of life and functional outcomes.    Time 4    Period Weeks    Status On-going    Target Date 11/06/20             PT Long Term Goals - 10/30/20 0937      PT LONG TERM GOAL #1   Title Patient will be able to transition from sit to stand without use of upper extremities and without difficulty to demonstrate improved transitional mobility    Time 8    Period Weeks    Status Achieved      PT LONG TERM GOAL #2   Title Patient will improve on FOTO score to meet predicted outcomes to demonstrate improved functional mobility.    Time 8    Period Weeks    Status Achieved      PT LONG TERM GOAL #3   Title Patient will report at least 50% improvement in overall symptoms and/or function to demonstrate improved functional mobility    Baseline 55-60% better    Time 8    Period Weeks    Status Achieved      PT LONG TERM GOAL #4   Title Patient will report at least 75% improvement in overall symptoms and/or function to demonstrate improved functional mobility    Time 5    Period Weeks    Status New     Target Date 12/04/20  Plan - 11/12/20 0925    Clinical Impression Statement Continued with strengthening and stability exercises.  Incorporated UE flexion with forward lunges and lateral raises with lateral lunges.  2 sets of 15 reps completed with all exercises this session.  No reports of pain or discomfort with any activities.  Noted fatigue at end of session.    Personal Factors and Comorbidities Comorbidity 1;Comorbidity 2;Comorbidity 3+    Comorbidities lymphoma, hx of chemo, DB    Examination-Activity Limitations Bed Mobility;Bend;Squat;Stairs;Stand;Transfers;Locomotion Level;Lift;Sit    Examination-Participation Restrictions Cleaning;Community Activity;Meal Prep;Laundry;Shop    Stability/Clinical Decision Making Evolving/Moderate complexity    Rehab Potential Good    PT Frequency Other (comment)   1-2x/week for total of 12 visits over 8 week certification   PT Duration 8 weeks    PT Treatment/Interventions ADLs/Self Care Home Management;Aquatic Therapy;Cryotherapy;Moist Heat;Traction;Balance training;Therapeutic exercise;Therapeutic activities;Functional mobility training;Stair training;Gait training;Neuromuscular re-education;Patient/family education;Manual techniques;Joint Manipulations    PT Next Visit Plan LE strengthening and lumbar ROM .  Continue to progress standing and functional strengthening.    PT Home Exercise Plan prone knee bends, bed rolling; 10/28 SLR, bridges, TRA activation, hamstring curls, shoulder flexion with dowel; 11/6 shoulder extension with dowel, clamshell; 11/22 chair pose, lateral stepping    Consulted and Agree with Plan of Care Patient           Patient will benefit from skilled therapeutic intervention in order to improve the following deficits and impairments:  Decreased endurance, Pain, Decreased strength, Decreased activity tolerance, Decreased balance, Decreased mobility, Difficulty walking, Decreased range of motion  Visit  Diagnosis: Muscle weakness (generalized)  Difficulty in walking, not elsewhere classified  Chronic midline low back pain with right-sided sciatica     Problem List Patient Active Problem List   Diagnosis Date Noted  . Acute right-sided low back pain   . S/P bronchoscopy   . FUO (fever of unknown origin) 04/26/2020  . Lung nodules   . Sepsis due to undetermined organism ([place]) 04/22/2020  . Lobar pneumonia ([place]) 04/22/2020  . 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 Sepsis due to pneumonia ([place]) 04/21/2020  . Type 2 diabetes mellitus with hyperglycemia, without long-term current use of insulin ([place]) 02/20/2020  . Essential hypertension, benign 02/20/2020  . Mixed hyperlipidemia 02/20/2020  . Port-A-Cath in place 01/12/2020  . Trichomonal infection 02/17/2019  . PTTD (posterior tibial tendon dysfunction) 08/21/2015  . Transaminitis 05/31/2014  . Inguinal adenopathy 10/20/2013  . Breast mass, left 10/20/2013  . Neuropathy due to chemotherapeutic drug ([place]) 09/29/2013  . Tumor lysis syndrome 08/21/2013  . Hyperglycemia without ketosis 08/21/2013  . Acute renal failure ([place]) 08/21/2013  . Acute encephalopathy 08/21/2013  . Hyperkalemia 08/21/2013  . Lymphoma ([place]) 05/19/2013  . Shortness of breath 05/06/2013  . Atrial fibrillation with RVR ([place]) 05/06/2013  . Bilateral pneumonia 05/06/2013  . Acute renal insufficiency 05/06/2013  . Normocytic anemia 05/06/2013  . GERD (gastroesophageal reflux disease) 02/02/2012   Teena Irani, PTA/CLT [phone]  Teena Irani 11/12/2020, 9:25 AM  [place], [place], [address] Phone: [phone]   Fax:  [phone]  Name: Megan Mcknight MRN: [id number] Date of Birth: November 09, 1961

## 2020-11-13 DIAGNOSIS — C8460 Anaplastic large cell lymphoma, ALK-positive, unspecified site: Secondary | ICD-10-CM | POA: Diagnosis not present

## 2020-11-13 DIAGNOSIS — Z5189 Encounter for other specified aftercare: Secondary | ICD-10-CM | POA: Diagnosis not present

## 2020-11-13 DIAGNOSIS — C8468 Anaplastic large cell lymphoma, ALK-positive, lymph nodes of multiple sites: Secondary | ICD-10-CM | POA: Diagnosis not present

## 2020-11-14 ENCOUNTER — Ambulatory Visit (HOSPITAL_COMMUNITY): Payer: Medicare Other | Attending: Physician Assistant | Admitting: Physical Therapy

## 2020-11-14 ENCOUNTER — Other Ambulatory Visit: Payer: Self-pay

## 2020-11-14 DIAGNOSIS — M25612 Stiffness of left shoulder, not elsewhere classified: Secondary | ICD-10-CM | POA: Diagnosis not present

## 2020-11-14 DIAGNOSIS — M6281 Muscle weakness (generalized): Secondary | ICD-10-CM

## 2020-11-14 DIAGNOSIS — M25512 Pain in left shoulder: Secondary | ICD-10-CM | POA: Diagnosis not present

## 2020-11-14 DIAGNOSIS — M5441 Lumbago with sciatica, right side: Secondary | ICD-10-CM | POA: Diagnosis not present

## 2020-11-14 DIAGNOSIS — G8929 Other chronic pain: Secondary | ICD-10-CM | POA: Diagnosis not present

## 2020-11-14 DIAGNOSIS — R262 Difficulty in walking, not elsewhere classified: Secondary | ICD-10-CM | POA: Insufficient documentation

## 2020-11-14 NOTE — Therapy (Addendum)
[place], [place], [address] Phone: [phone]   Fax:  [phone]  Physical Therapy Treatment  Patient Details  Name: Megan Mcknight MRN: [id number] Date of Birth: 1961-08-17 Referring Provider (PT):  Felisa Bonier   Encounter Date: 11/14/2020   PT End of Session - 11/14/20 1107    Visit Number 11   Number of Visits 16    Date for PT Re-Evaluation 12/04/20    Authorization Type Medicare    Progress Note Due on Visit 16    PT Start Time 1004    PT Stop Time 1046    PT Time Calculation (min) 42 min    Activity Tolerance Patient tolerated treatment well    Behavior During Therapy [facility] for tasks assessed/performed           Past Medical History:  Diagnosis Date  . Acute bronchitis   . Anaplastic large cell lymphoma 2014   remission since 2015  . Asthma   . Cancer ([place])    lung and kidney  . Colon polyps 02/2013   Per colonoscopy  . Coma ([place])   . Diabetes mellitus without complication ([place])   . Diverticulosis 02/2013   Per colonoscopy  . Dysrhythmia   . Former light tobacco smoker   . GERD (gastroesophageal reflux disease)   . H/O stem cell transplant ([place]) 2015  . Hyperlipidemia   . Nerve pain    Left leg  . Non Hodgkin's lymphoma ([place])   . Seasonal allergies     Past Surgical History:  Procedure Laterality Date  . bones removed from Baby toes    . BRONCHIAL BRUSHINGS  04/25/2020   Procedure: BRONCHIAL BRUSHINGS;  Surgeon: Rigoberto Noel, MD;  Location: [place];  Service: Cardiopulmonary;;  . BRONCHIAL WASHINGS  04/25/2020   Procedure: BRONCHIAL WASHINGS;  Surgeon: Rigoberto Noel, MD;  Location: [place];  Service: Cardiopulmonary;;  . COLONOSCOPY  02/16/2012   Procedure: COLONOSCOPY;  Surgeon: Daneil Dolin, MD;  [facility];  Service: Endoscopy;  Laterality: N/A;  1:30  . COLONOSCOPY N/A 03/26/2017   Procedure: COLONOSCOPY;  Surgeon: Daneil Dolin, MD;  [facility];  Service: Endoscopy;  Laterality: N/A;  930  . IR IMAGING GUIDED PORT INSERTION  12/19/2019  . LUNG BIOPSY  04/25/2020   Procedure: LUNG BIOPSY;  Surgeon: Rigoberto Noel, MD;  Location: [facility];  Service: Cardiopulmonary;;  . MEDIASTINOSCOPY N/A 05/13/2013   Procedure: MEDIASTINOSCOPY;  Surgeon: Melrose Nakayama, MD;  Location: [place];  Service: Thoracic;  Laterality: N/A;  . PARTIAL HYSTERECTOMY    . VAGINAL HYSTERECTOMY     partial hyst   . VIDEO BRONCHOSCOPY N/A 04/25/2020   Procedure: VIDEO BRONCHOSCOPY WITH FLUORO;  Surgeon: Rigoberto Noel, MD;  Location: [place];  Service: Cardiopulmonary;  Laterality: N/A;  . VIDEO BRONCHOSCOPY WITH ENDOBRONCHIAL ULTRASOUND N/A 05/13/2013   Procedure: VIDEO BRONCHOSCOPY WITH ENDOBRONCHIAL ULTRASOUND;  Surgeon: Melrose Nakayama, MD;  Location: [place];  Service: Thoracic;  Laterality: N/A;    There were no vitals filed for this visit.   Subjective Assessment - 11/14/20 1110    Subjective Pt is doing well without any complaints or pain.  States her PET scan shows no cancer.    Currently in Pain? No/denies  [facility] Adult PT Treatment/Exercise - 11/14/20 0001      Lumbar Exercises: Standing   Forward Lunge 20 reps    Forward Lunge Limitations onto 4" box with 2# DB bicep curls UE; 2 sets    Side Lunge 20 reps    Side Lunge Limitations onto 4" box with 2# DB UE lateral flexion; 2 sets    Scapular Retraction Strengthening;Both;15 reps;Theraband    Theraband Level (Scapular Retraction) Level 3 (Green)    Scapular Retraction Limitations 2 sets    Row Both;15 reps;Theraband    Theraband Level (Row) Level 3 (Green)    Row Limitations 2 sets    Shoulder Extension Both;15 reps;Theraband    Theraband Level (Shoulder Extension) Level 3 (Green)    Shoulder Extension Limitations 2 sets    Other Standing Lumbar Exercises Paloff x 15 GTB; side step x 2 RT with GTB      Lumbar Exercises:  Seated   Sit to Stand 15 reps;Limitations    Sit to Stand Limitations 2 sets no UE's                    PT Short Term Goals - 10/30/20 0936      PT SHORT TERM GOAL #1   Title Patient will be able to roll over in bed from supine to prone without pain or difficulties to demonstrate improved transitional mobility    Baseline able to perform with no pain or difficulty    Time 4    Period Weeks    Status Achieved    Target Date 11/06/20      PT SHORT TERM GOAL #2   Title Patient will report at least 25% improvement in overall symptoms and/or function to demonstrate improved functional mobility    Baseline 55-60% better    Time 4    Period Weeks    Status Achieved    Target Date 11/06/20      PT SHORT TERM GOAL #3   Title Patient will be independent in self management strategies to improve quality of life and functional outcomes.    Time 4    Period Weeks    Status On-going    Target Date 11/06/20             PT Long Term Goals - 10/30/20 0937      PT LONG TERM GOAL #1   Title Patient will be able to transition from sit to stand without use of upper extremities and without difficulty to demonstrate improved transitional mobility    Time 8    Period Weeks    Status Achieved      PT LONG TERM GOAL #2   Title Patient will improve on FOTO score to meet predicted outcomes to demonstrate improved functional mobility.    Time 8    Period Weeks    Status Achieved      PT LONG TERM GOAL #3   Title Patient will report at least 50% improvement in overall symptoms and/or function to demonstrate improved functional mobility    Baseline 55-60% better    Time 8    Period Weeks    Status Achieved      PT LONG TERM GOAL #4   Title Patient will report at least 75% improvement in overall symptoms and/or function to demonstrate improved functional mobility    Time 5    Period Weeks    Status New    Target Date 12/04/20  Plan - 11/14/20 1107     Clinical Impression Statement Continued with LE functional strengthening and stability.   Increased most exercises to 20 reps today and continued with 2 sets of theraband exercises.  Pt required minimal cues with activities today.  Pt given theraband and written instructions for home use.  Pt is overall improving with less fatigue noted.    Personal Factors and Comorbidities Comorbidity 1;Comorbidity 2;Comorbidity 3+    Comorbidities lymphoma, hx of chemo, DB    Examination-Activity Limitations Bed Mobility;Bend;Squat;Stairs;Stand;Transfers;Locomotion Level;Lift;Sit    Examination-Participation Restrictions Cleaning;Community Activity;Meal Prep;Laundry;Shop    Stability/Clinical Decision Making Evolving/Moderate complexity    Rehab Potential Good    PT Frequency Other (comment)   1-2x/week for total of 12 visits over 8 week certification   PT Duration 8 weeks    PT Treatment/Interventions ADLs/Self Care Home Management;Aquatic Therapy;Cryotherapy;Moist Heat;Traction;Balance training;Therapeutic exercise;Therapeutic activities;Functional mobility training;Stair training;Gait training;Neuromuscular re-education;Patient/family education;Manual techniques;Joint Manipulations    PT Next Visit Plan continue to progress functional strength.  Complete stairs next session.  Discuss contuation (has no further appt scheduled)    PT Home Exercise Plan prone knee bends, bed rolling; 10/28 SLR, bridges, TRA activation, hamstring curls, shoulder flexion with dowel; 11/6 shoulder extension with dowel, clamshell; 11/22 chair pose, lateral stepping 12/l1:  GTB and postural 3/palloff    Consulted and Agree with Plan of Care Patient           Patient will benefit from skilled therapeutic intervention in order to improve the following deficits and impairments:  Decreased endurance, Pain, Decreased strength, Decreased activity tolerance, Decreased balance, Decreased mobility, Difficulty walking, Decreased range of  motion  Visit Diagnosis: Chronic midline low back pain with right-sided sciatica  Difficulty in walking, not elsewhere classified  Muscle weakness (generalized)     Problem List Patient Active Problem List   Diagnosis Date Noted  . Acute right-sided low back pain   . S/P bronchoscopy   . FUO (fever of unknown origin) 04/26/2020  . Lung nodules   . Sepsis due to undetermined organism ([place]) 04/22/2020  . Lobar pneumonia ([place]) 04/22/2020  . 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 Sepsis due to pneumonia ([place]) 04/21/2020  . Type 2 diabetes mellitus with hyperglycemia, without long-term current use of insulin ([place]) 02/20/2020  . Essential hypertension, benign 02/20/2020  . Mixed hyperlipidemia 02/20/2020  . Port-A-Cath in place 01/12/2020  . Trichomonal infection 02/17/2019  . PTTD (posterior tibial tendon dysfunction) 08/21/2015  . Transaminitis 05/31/2014  . Inguinal adenopathy 10/20/2013  . Breast mass, left 10/20/2013  . Neuropathy due to chemotherapeutic drug ([place]) 09/29/2013  . Tumor lysis syndrome 08/21/2013  . Hyperglycemia without ketosis 08/21/2013  . Acute renal failure ([place]) 08/21/2013  . Acute encephalopathy 08/21/2013  . Hyperkalemia 08/21/2013  . Lymphoma ([place]) 05/19/2013  . Shortness of breath 05/06/2013  . Atrial fibrillation with RVR ([place]) 05/06/2013  . Bilateral pneumonia 05/06/2013  . Acute renal insufficiency 05/06/2013  . Normocytic anemia 05/06/2013  . GERD (gastroesophageal reflux disease) 02/02/2012   Teena Irani, PTA/CLT [phone]  Teena Irani 11/14/2020, 11:10 AM  [place], [place], [address] Phone: [phone]   Fax:  [phone]  Name: Megan Mcknight MRN: [id number] Date of Birth: [birth year]

## 2020-11-16 DIAGNOSIS — H2513 Age-related nuclear cataract, bilateral: Secondary | ICD-10-CM | POA: Diagnosis not present

## 2020-11-16 DIAGNOSIS — H40013 Open angle with borderline findings, low risk, bilateral: Secondary | ICD-10-CM | POA: Diagnosis not present

## 2020-11-19 DIAGNOSIS — Z9484 Stem cells transplant status: Secondary | ICD-10-CM | POA: Diagnosis not present

## 2020-11-19 DIAGNOSIS — C8462 Anaplastic large cell lymphoma, ALK-positive, intrathoracic lymph nodes: Secondary | ICD-10-CM | POA: Diagnosis not present

## 2020-11-19 DIAGNOSIS — C8460 Anaplastic large cell lymphoma, ALK-positive, unspecified site: Secondary | ICD-10-CM | POA: Diagnosis not present

## 2020-11-19 DIAGNOSIS — Z9221 Personal history of antineoplastic chemotherapy: Secondary | ICD-10-CM | POA: Diagnosis not present

## 2020-11-20 ENCOUNTER — Ambulatory Visit (HOSPITAL_COMMUNITY): Payer: Medicare Other | Admitting: Physical Therapy

## 2020-11-20 ENCOUNTER — Encounter (HOSPITAL_COMMUNITY): Payer: Medicare Other | Admitting: Physical Therapy

## 2020-11-20 ENCOUNTER — Encounter (HOSPITAL_COMMUNITY): Payer: Self-pay | Admitting: Physical Therapy

## 2020-11-20 ENCOUNTER — Other Ambulatory Visit: Payer: Self-pay

## 2020-11-20 DIAGNOSIS — G8929 Other chronic pain: Secondary | ICD-10-CM | POA: Diagnosis not present

## 2020-11-20 DIAGNOSIS — M6281 Muscle weakness (generalized): Secondary | ICD-10-CM | POA: Diagnosis not present

## 2020-11-20 DIAGNOSIS — R262 Difficulty in walking, not elsewhere classified: Secondary | ICD-10-CM

## 2020-11-20 DIAGNOSIS — M25512 Pain in left shoulder: Secondary | ICD-10-CM | POA: Diagnosis not present

## 2020-11-20 DIAGNOSIS — M5441 Lumbago with sciatica, right side: Secondary | ICD-10-CM | POA: Diagnosis not present

## 2020-11-20 DIAGNOSIS — M25612 Stiffness of left shoulder, not elsewhere classified: Secondary | ICD-10-CM

## 2020-11-20 NOTE — Therapy (Signed)
[place], [place], [address] Phone: [phone]   Fax:  [phone]  Physical Therapy Treatment, Progress Note and RECERT  Patient Details  Name: Megan Mcknight MRN: [id number] Date of Birth: [birth year] Referring Provider (PT):  Felisa Bonier  Progress Note Reporting Period 10/30/20 to 11/20/20  See note below for Objective Data and Assessment of Progress/Goals.       Encounter Date: 11/20/2020   PT End of Session - 11/20/20 1006    Visit Number 12    Number of Visits 20    Date for PT Re-Evaluation 01/01/21    Authorization Type Medicare    Progress Note Due on Visit 80    PT Start Time 1006    PT Stop Time 1045    PT Time Calculation (min) 39 min    Activity Tolerance Patient tolerated treatment well    Behavior During Therapy WFL for tasks assessed/performed           Past Medical History:  Diagnosis Date  . Acute bronchitis   . Anaplastic large cell lymphoma 2014   remission since 2015  . Asthma   . Cancer ([place])    lung and kidney  . Colon polyps 02/2013   Per colonoscopy  . Coma ([place])   . Diabetes mellitus without complication ([place])   . Diverticulosis 02/2013   Per colonoscopy  . Dysrhythmia   . Former light tobacco smoker   . GERD (gastroesophageal reflux disease)   . H/O stem cell transplant ([place]) 2015  . Hyperlipidemia   . Nerve pain    Left leg  . Non Hodgkin's lymphoma ([place])   . Seasonal allergies     Past Surgical History:  Procedure Laterality Date  . bones removed from Baby toes    . BRONCHIAL BRUSHINGS  04/25/2020   Procedure: BRONCHIAL BRUSHINGS;  Surgeon: Rigoberto Noel, MD;  Location: [place];  Service: Cardiopulmonary;;  . BRONCHIAL WASHINGS  04/25/2020   Procedure: BRONCHIAL WASHINGS;  Surgeon: Rigoberto Noel, MD;  Location: [place];  Service: Cardiopulmonary;;  . COLONOSCOPY  02/16/2012   Procedure: COLONOSCOPY;  Surgeon: Daneil Dolin, MD;  [facility];  Service: Endoscopy;  Laterality: N/A;  1:30  . COLONOSCOPY N/A 03/26/2017   Procedure: COLONOSCOPY;  Surgeon: Daneil Dolin, MD;  [facility];  Service: Endoscopy;  Laterality: N/A;  930  . IR IMAGING GUIDED PORT INSERTION  12/19/2019  . LUNG BIOPSY  04/25/2020   Procedure: LUNG BIOPSY;  Surgeon: Rigoberto Noel, MD;  Location: [facility] ENDOSCOPY;  Service: Cardiopulmonary;;  . MEDIASTINOSCOPY N/A 05/13/2013   Procedure: MEDIASTINOSCOPY;  Surgeon: Melrose Nakayama, MD;  Location: [place];  Service: Thoracic;  Laterality: N/A;  . PARTIAL HYSTERECTOMY    . VAGINAL HYSTERECTOMY     partial hyst   . VIDEO BRONCHOSCOPY N/A 04/25/2020   Procedure: VIDEO BRONCHOSCOPY WITH FLUORO;  Surgeon: Rigoberto Noel, MD;  Location: [place];  Service: Cardiopulmonary;  Laterality: N/A;  . VIDEO BRONCHOSCOPY WITH ENDOBRONCHIAL ULTRASOUND N/A 05/13/2013   Procedure: VIDEO BRONCHOSCOPY WITH ENDOBRONCHIAL ULTRASOUND;  Surgeon: Melrose Nakayama, MD;  Location: [place];  Service: Thoracic;  Laterality: N/A;    There were no vitals filed for this visit.   Subjective Assessment - 11/20/20 1102    Subjective States that she feels 75% or better overall since the start of PT but her left shoulder continues to bother her. States that her  left arm is still challenging to move and she still has pain with movements. States it feels weak. States her back feels 95% better since the start of PT. States her exercises are going well.  Reports she would like to work on her shoulder as it limits her to cook, do laundry and other overhead tasks.    Currently in Pain? Yes              [facility] PT Assessment - 11/20/20 0001      Assessment   Medical Diagnosis LBP secondary to pathological fracture at L5     Referring Provider (PT)  Felisa Bonier      Observation/Other Assessments   Focus on Therapeutic Outcomes (FOTO)  83% function    initial 46% --> predicted 63% function     ROM / Strength    AROM / PROM / Strength AROM;Strength;PROM      AROM   AROM Assessment Site Shoulder    Right/Left Shoulder Left;Right    Right Shoulder Flexion 165 Degrees    Right Shoulder ABduction 155 Degrees    Right Shoulder Internal Rotation --   reaches to T12 SP    Right Shoulder External Rotation --   reaches to T3 SP    Left Shoulder Flexion 95 Degrees   painful in left shoulder   Left Shoulder ABduction 85 Degrees   pain on the top of shoulder   Left Shoulder Internal Rotation --   reaches to left greater trochanter- pain    Left Shoulder External Rotation --   reaches to left superior border scapula - pain      PROM   PROM Assessment Site Shoulder    Right/Left Shoulder Left    Left Shoulder Internal Rotation 30 Degrees   45 degrees abd, pain in shoulder   Left Shoulder External Rotation 40 Degrees   45 degrees abd, pain in shoulder     Strength   Strength Assessment Site Shoulder    Right/Left Shoulder Right;Left    Right Shoulder Flexion 4+/5    Right Shoulder ABduction 5/5    Right Shoulder Internal Rotation 5/5    Right Shoulder External Rotation 4-/5   pain in shoulder/arm    Left Shoulder Flexion 3+/5   pain in shoulder/arm    Left Shoulder ABduction 4+/5    Left Shoulder Internal Rotation 4+/5    Left Shoulder External Rotation 4/5   pain in shoulder/arm.    Right Hip Flexion 5/5    Left Hip Flexion 5/5    Right Knee Flexion 5/5    Right Knee Extension 5/5    Left Knee Flexion 5/5    Left Knee Extension 5/5      Palpation   Palpation comment triggerpoints adn tenderness noted throughout left biceps and triceps                         [facility] Adult PT Treatment/Exercise - 11/20/20 0001      Exercises   Exercises Shoulder      Shoulder Exercises: Supine   Protraction AROM;Strengthening;Both;10 reps   5" holds, 3 sets      Manual Therapy   Manual Therapy Joint mobilization;Soft tissue mobilization    Manual therapy comments performed independently of  other interventions    Joint Mobilization left shoulder long axis traction  - tolerated well    Soft tissue mobilization STM to left deltoid, biceps and triceps  PT Education - 11/20/20 1103    Education Details on FOTO score, HEP, progress and POC moving forward    Person(s) Educated Patient    Methods Explanation    Comprehension Verbalized understanding            PT Short Term Goals - 11/20/20 1036      PT SHORT TERM GOAL #1   Title Patient will be able to roll over in bed from supine to prone without pain or difficulties to demonstrate improved transitional mobility    Baseline able to perform with no pain or difficulty    Time 4    Period Weeks    Status Achieved    Target Date 11/06/20      PT SHORT TERM GOAL #2   Title Patient will report at least 25% improvement in overall symptoms and/or function to demonstrate improved functional mobility    Baseline 55-60% better    Time 4    Period Weeks    Status Achieved    Target Date 11/06/20      PT SHORT TERM GOAL #3   Title Patient will be independent in self management strategies to improve quality of life and functional outcomes.    Time 4    Period Weeks    Status Achieved    Target Date 11/06/20             PT Long Term Goals - 11/20/20 1037      PT LONG TERM GOAL #1   Title Patient will be able to transition from sit to stand without use of upper extremities and without difficulty to demonstrate improved transitional mobility    Time 8    Period Weeks    Status Achieved      PT LONG TERM GOAL #2   Title Patient will improve on FOTO score to meet predicted outcomes to demonstrate improved functional mobility.    Time 8    Period Weeks    Status Achieved      PT LONG TERM GOAL #3   Title Patient will report at least 50% improvement in overall symptoms and/or function to demonstrate improved functional mobility    Baseline 55-60% better    Time 8    Period Weeks    Status  Achieved      PT LONG TERM GOAL #4   Title Patient will report at least 75% improvement in overall symptoms and/or function to demonstrate improved functional mobility    Baseline in regards to her back    Time 5    Period Weeks    Status Achieved      PT LONG TERM GOAL #5   Title Patient will be able hold laundry basket with both hands and ascend stairs to improve ability to go up stairs at home from laundry area    Baseline currently places basket on stairs and takes stairs one at a time    Time 6    Period Weeks    Status New    Target Date 01/01/21      Additional Long Term Goals   Additional Long Term Goals Yes      PT LONG TERM GOAL #6   Title Patient will be able to grab soup can (light cane) from shelf overhead with left arm at least 5x time to demonstrate improved function in the kitchen.    Time 6    Period Weeks    Status New    Target Date 01/01/21  PT LONG TERM GOAL #7   Title Patient will be able reach to L4 SP with left arm to improve ability to wash back and pull up pants.    Time 6    Period Weeks    Status New    Target Date 01/01/21                 Plan - 11/20/20 1104    Clinical Impression Statement Patient present for a progress note on this date in regards to her low back pain. Overall patient has met all short and long term goals for her back and has exceeded predicted FOTO score. Discussed current functional status and patient still limited in left shoulder ROM. Discussed options and patient would like tot continue PT for left shoulder. Sending MD order to determine if MD approves left shoulder PT. Extending PT in anticipation that MD agrees with PT focus on left shoulder. Will wait on treatment until order received.    Personal Factors and Comorbidities Comorbidity 1;Comorbidity 2;Comorbidity 3+    Comorbidities lymphoma, hx of chemo, DB    Examination-Activity Limitations Bed Mobility;Bend;Squat;Stairs;Stand;Transfers;Locomotion  Level;Lift;Sit    Examination-Participation Restrictions Cleaning;Community Activity;Meal Prep;Laundry;Shop    Stability/Clinical Decision Making Evolving/Moderate complexity    Rehab Potential Good    PT Frequency Other (comment)   1-2x/week for total of 8 visits over 6 week certification   PT Duration 6 weeks    PT Treatment/Interventions ADLs/Self Care Home Management;Aquatic Therapy;Cryotherapy;Moist Heat;Traction;Balance training;Therapeutic exercise;Therapeutic activities;Functional mobility training;Stair training;Gait training;Neuromuscular re-education;Patient/family education;Manual techniques;Joint Manipulations    PT Next Visit Plan RECERT TODAY to focus on left shoulder - waiting MD ORDER to start - focus on arthrokinematics adn scapular protraction, isometrics of shoulder    PT Home Exercise Plan prone knee bends, bed rolling; 10/28 SLR, bridges, TRA activation, hamstring curls, shoulder flexion with dowel; 11/6 shoulder extension with dowel, clamshell; 11/22 chair pose, lateral stepping 12/l1:  GTB and postural 3/palloff    Consulted and Agree with Plan of Care Patient           Patient will benefit from skilled therapeutic intervention in order to improve the following deficits and impairments:  Decreased endurance, Pain, Decreased strength, Decreased activity tolerance, Decreased balance, Decreased mobility, Difficulty walking, Decreased range of motion  Visit Diagnosis: Chronic midline low back pain with right-sided sciatica  Difficulty in walking, not elsewhere classified  Muscle weakness (generalized)  Chronic left shoulder pain  Decreased range of motion of left shoulder     Problem List Patient Active Problem List   Diagnosis Date Noted  . Acute right-sided low back pain   . S/P bronchoscopy   . FUO (fever of unknown origin) 04/26/2020  . Lung nodules   . Sepsis due to undetermined organism ([place]) 04/22/2020  . Lobar pneumonia ([place]) 04/22/2020  . Primary  systemic anaplastic large T-cell and null cell lymphoma ([place]) 04/22/2020  . Sepsis due to pneumonia ([place]) 04/21/2020  . Type 2 diabetes mellitus with hyperglycemia, without long-term current use of insulin ([place]) 02/20/2020  . Essential hypertension, benign 02/20/2020  . Mixed hyperlipidemia 02/20/2020  . Port-A-Cath in place 01/12/2020  . Trichomonal infection 02/17/2019  . PTTD (posterior tibial tendon dysfunction) 08/21/2015  . Transaminitis 05/31/2014  . Inguinal adenopathy 10/20/2013  . Breast mass, left 10/20/2013  . Neuropathy due to chemotherapeutic drug ([place]) 09/29/2013  . Tumor lysis syndrome 08/21/2013  . Hyperglycemia without ketosis 08/21/2013  . Acute renal failure ([place]) 08/21/2013  . Acute encephalopathy 08/21/2013  . Hyperkalemia  08/21/2013  . Lymphoma ([place]) 05/19/2013  . Shortness of breath 05/06/2013  . Atrial fibrillation with RVR ([place]) 05/06/2013  . Bilateral pneumonia 05/06/2013  . Acute renal insufficiency 05/06/2013  . Normocytic anemia 05/06/2013  . GERD (gastroesophageal reflux disease) 02/02/2012    11:13 AM, 11/20/20 Jerene Pitch, DPT Physical Therapy with [facility]  [phone] office  [place] [address] [place], [place], [address] Phone: [phone]   Fax:  [phone]  Name: Megan Mcknight MRN: [id number] Date of Birth: 08/07/1961

## 2020-11-22 ENCOUNTER — Encounter (HOSPITAL_COMMUNITY): Payer: Medicare Other | Admitting: Physical Therapy

## 2020-11-23 DIAGNOSIS — C8589 Other specified types of non-Hodgkin lymphoma, extranodal and solid organ sites: Secondary | ICD-10-CM | POA: Diagnosis not present

## 2020-11-23 DIAGNOSIS — I10 Essential (primary) hypertension: Secondary | ICD-10-CM | POA: Diagnosis not present

## 2020-11-23 DIAGNOSIS — Z1331 Encounter for screening for depression: Secondary | ICD-10-CM | POA: Diagnosis not present

## 2020-11-23 DIAGNOSIS — Z1389 Encounter for screening for other disorder: Secondary | ICD-10-CM | POA: Diagnosis not present

## 2020-11-23 DIAGNOSIS — E119 Type 2 diabetes mellitus without complications: Secondary | ICD-10-CM | POA: Diagnosis not present

## 2020-11-23 DIAGNOSIS — E785 Hyperlipidemia, unspecified: Secondary | ICD-10-CM | POA: Diagnosis not present

## 2020-11-29 ENCOUNTER — Ambulatory Visit: Payer: Medicare Other | Admitting: "Endocrinology

## 2020-11-30 ENCOUNTER — Encounter: Payer: Self-pay | Admitting: "Endocrinology

## 2020-11-30 ENCOUNTER — Other Ambulatory Visit: Payer: Self-pay

## 2020-11-30 ENCOUNTER — Ambulatory Visit (INDEPENDENT_AMBULATORY_CARE_PROVIDER_SITE_OTHER): Payer: Medicare Other | Admitting: "Endocrinology

## 2020-11-30 VITALS — BP 126/72 | HR 68 | Ht 68.0 in | Wt 242.0 lb

## 2020-11-30 DIAGNOSIS — E782 Mixed hyperlipidemia: Secondary | ICD-10-CM | POA: Diagnosis not present

## 2020-11-30 DIAGNOSIS — E119 Type 2 diabetes mellitus without complications: Secondary | ICD-10-CM | POA: Diagnosis not present

## 2020-11-30 DIAGNOSIS — Z0001 Encounter for general adult medical examination with abnormal findings: Secondary | ICD-10-CM | POA: Diagnosis not present

## 2020-11-30 DIAGNOSIS — E785 Hyperlipidemia, unspecified: Secondary | ICD-10-CM | POA: Diagnosis not present

## 2020-11-30 DIAGNOSIS — E1165 Type 2 diabetes mellitus with hyperglycemia: Secondary | ICD-10-CM

## 2020-11-30 DIAGNOSIS — I10 Essential (primary) hypertension: Secondary | ICD-10-CM | POA: Diagnosis not present

## 2020-11-30 LAB — POCT GLYCOSYLATED HEMOGLOBIN (HGB A1C): HbA1c, POC (controlled diabetic range): 5.8 % (ref 0.0–7.0)

## 2020-11-30 NOTE — Patient Instructions (Signed)
                                     Advice for Weight Management  -For most of us the best way to lose weight is by diet management. Generally speaking, diet management means consuming less calories intentionally which over time brings about progressive weight loss.  This can be achieved more effectively by restricting carbohydrate consumption to the minimum possible.  So, it is critically important to know your numbers: how much calorie you are consuming and how much calorie you need. More importantly, our carbohydrates sources should be unprocessed or minimally processed complex starch food items.   Sometimes, it is important to balance nutrition by increasing protein intake (animal or plant source), fruits, and vegetables.  -Sticking to a routine mealtime to eat 3 meals a day and avoiding unnecessary snacks is shown to have a big role in weight control. Under normal circumstances, the only time we lose real weight is when we are hungry, so allow hunger to take place- hunger means no food between meal times, only water.  It is not advisable to starve.   -It is better to avoid simple carbohydrates including: Cakes, Sweet Desserts, Ice Cream, Soda (diet and regular), Sweet Tea, Candies, Chips, Cookies, Store Bought Juices, Alcohol in Excess of  1-2 drinks a day, Lemonade,  Artificial Sweeteners, Doughnuts, Coffee Creamers, "Sugar-free" Products, etc, etc.  This is not a complete list.....    -Consulting with certified diabetes educators is proven to provide you with the most accurate and current information on diet.  Also, you may be  interested in discussing diet options/exchanges , we can schedule a visit with Megan Mcknight, RDN, CDE for individualized nutrition education.  -Exercise: If you are able: 30 -60 minutes a day ,4 days a week, or 150 minutes a week.  The longer the better.  Combine stretch, strength, and aerobic activities.  If you were told in the  past that you have high risk for cardiovascular diseases, you may seek evaluation by your heart doctor prior to initiating moderate to intense exercise programs.                                  Additional Care Considerations for Diabetes   -Diabetes  is a chronic disease.  The most important care consideration is regular follow-up with your diabetes care provider with the goal being avoiding or delaying its complications and to take advantage of advances in medications and technology.    -Type 2 diabetes is known to coexist with other important comorbidities such as high blood pressure and high cholesterol.  It is critical to control not only the diabetes but also the high blood pressure and high cholesterol to minimize and delay the risk of complications including coronary artery disease, stroke, amputations, blindness, etc.    - Studies showed that people with diabetes will benefit from a class of medications known as ACE inhibitors and statins.  Unless there are specific reasons not to be on these medications, the standard of care is to consider getting one from these groups of medications at an optimal doses.  These medications are generally considered safe and proven to help protect the heart and the kidneys.    - People with diabetes are encouraged to initiate and maintain regular follow-up with eye doctors, foot   doctors, dentists , and if necessary heart and kidney doctors.     - It is highly recommended that people with diabetes quit smoking or stay away from smoking, and get yearly  flu vaccine and pneumonia vaccine at least every 5 years.  One other important lifestyle recommendation is to ensure adequate sleep - at least 6-7 hours of uninterrupted sleep at night.  -Exercise: If you are able: 30 -60 minutes a day, 4 days a week, or 150 minutes a week.  The longer the better.  Combine stretch, strength, and aerobic activities.  If you were told in the past that you have high risk for  cardiovascular diseases, you may seek evaluation by your heart doctor prior to initiating moderate to intense exercise programs.          

## 2020-11-30 NOTE — Progress Notes (Signed)
11/30/2020, 11:20 AM  Endocrinology follow-up note   Subjective:    Patient ID: Megan Mcknight, female    DOB: 1961-12-14.  [name] is being seen in follow-up after she was seen in consultation for management of currently uncontrolled symptomatic diabetes requested by  Rosita Fire, MD.   Past Medical History:  Diagnosis Date  . Acute bronchitis   . Anaplastic large cell lymphoma 2014   remission since 2015  . Asthma   . Cancer ([place])    lung and kidney  . Colon polyps 02/2013   Per colonoscopy  . Coma ([place])   . Diabetes mellitus without complication ([place])   . Diverticulosis 02/2013   Per colonoscopy  . Dysrhythmia   . Former light tobacco smoker   . GERD (gastroesophageal reflux disease)   . H/O stem cell transplant ([place]) 2015  . Hyperlipidemia   . Nerve pain    Left leg  . Non Hodgkin's lymphoma ([place])   . Seasonal allergies     Past Surgical History:  Procedure Laterality Date  . bones removed from Baby toes    . BRONCHIAL BRUSHINGS  04/25/2020   Procedure: BRONCHIAL BRUSHINGS;  Surgeon: Rigoberto Noel, MD;  Location: [facility];  Service: Cardiopulmonary;;  . BRONCHIAL WASHINGS  04/25/2020   Procedure: BRONCHIAL WASHINGS;  Surgeon: Rigoberto Noel, MD;  Location: [facility];  Service: Cardiopulmonary;;  . COLONOSCOPY  02/16/2012   Procedure: COLONOSCOPY;  Surgeon: Daneil Dolin, MD;  [facility];  Service: Endoscopy;  Laterality: N/A;  1:30  . COLONOSCOPY N/A 03/26/2017   Procedure: COLONOSCOPY;  Surgeon: Daneil Dolin, MD;  [facility];  Service: Endoscopy;  Laterality: N/A;  930  . IR IMAGING GUIDED PORT INSERTION  12/19/2019  . LUNG BIOPSY  04/25/2020   Procedure: LUNG BIOPSY;  Surgeon: Rigoberto Noel, MD;  [facility];  Service: Cardiopulmonary;;  . MEDIASTINOSCOPY N/A 05/13/2013   Procedure: MEDIASTINOSCOPY;  Surgeon: Melrose Nakayama, MD;  Location: [facility];  Service: Thoracic;  Laterality: N/A;  . PARTIAL HYSTERECTOMY    . VAGINAL HYSTERECTOMY     partial hyst   . VIDEO BRONCHOSCOPY N/A 04/25/2020   Procedure: VIDEO BRONCHOSCOPY WITH FLUORO;  Surgeon: Rigoberto Noel, MD;  Location: [facility];  Service: Cardiopulmonary;  Laterality: N/A;  . VIDEO BRONCHOSCOPY WITH ENDOBRONCHIAL ULTRASOUND N/A 05/13/2013   Procedure: VIDEO BRONCHOSCOPY WITH ENDOBRONCHIAL ULTRASOUND;  Surgeon: Melrose Nakayama, MD;  Location: [facility];  Service: Thoracic;  Laterality: N/A;    Social History   Socioeconomic History  . Marital status: Divorced    Spouse name: Not on file  . Number of children: 3  . Years of education: Not on file  . Highest education level: Not on file  Occupational History  . Occupation: Lobbyist: Physicist, medical  Tobacco Use  . Smoking status: Former Smoker    Packs/day: 0.50    Types: Cigarettes    Quit date: 04/19/2013    Years since quitting: 7.6  . Smokeless tobacco: Never Used  Vaping Use  . Vaping Use: Never used  Substance and Sexual Activity  . Alcohol use: No  . Drug use: No  . Sexual activity:  Yes    Birth control/protection: Surgical  Other Topics Concern  . Not on file  Social History Narrative  . Not on file   Social Determinants of Health   Financial Resource Strain: Not on file  Food Insecurity: Not on file  Transportation Needs: Not on file  Physical Activity: Not on file  Stress: Not on file  Social Connections: Not on file    Family History  Problem Relation Age of Onset  . Colon polyps Mother   . Hypertension Mother   . Heart disease Mother   . Hyperlipidemia Mother   . Cancer Father        prostate  . Hypertension Father   . Hyperlipidemia Father   . Heart defect Other        family history   . Asthma Other        family history     Outpatient Encounter Medications as of 11/30/2020  Medication Sig  . ACCU-CHEK GUIDE test strip USE AS INSTRUCTED  . albuterol  (PROVENTIL HFA;VENTOLIN HFA) 108 (90 Base) MCG/ACT inhaler Inhale 2 puffs into the lungs every 4 (four) hours as needed for wheezing or shortness of breath.  . blood glucose meter kit and supplies KIT 1 each by Does not apply route 4 (four) times daily. Dispense based on patient and insurance preference. Use up to four times daily as directed. (FOR ICD-9 250.00, 250.01). (Patient not taking: Reported on 04/21/2020)  . Blood Glucose Monitoring Suppl (ACCU-CHEK GUIDE) w/Device KIT 1 Piece by Does not apply route as directed. (Patient not taking: Reported on 04/21/2020)  . latanoprost (XALATAN) 0.005 % ophthalmic solution 1 drop at bedtime.  . metFORMIN (GLUCOPHAGE) 500 MG tablet Take 500 mg by mouth daily with breakfast.  . metoprolol tartrate (LOPRESSOR) 25 MG tablet Take 25 mg by mouth 2 (two) times daily.   . montelukast (SINGULAIR) 10 MG tablet Take 10 mg by mouth at bedtime.  . rosuvastatin (CRESTOR) 10 MG tablet Take 10 mg by mouth daily.  . [DISCONTINUED] Insulin Pen Needle (B-D ULTRAFINE III SHORT PEN) 31G X 8 MM MISC 1 each by Does not apply route as directed. (Patient not taking: Reported on 04/21/2020)   No facility-administered encounter medications on file as of 11/30/2020.    ALLERGIES: Allergies  Allergen Reactions  . Iodine Anaphylaxis    Told to avoid due to shellfish allergy. Told to avoid due to shellfish allergy.  Marland Kitchen Shellfish-Derived Products Anaphylaxis and Swelling    Eyes swelling, scratchy throat, bumps on lips.    VACCINATION STATUS: Immunization History  Administered Date(s) Administered  . DTaP / Hep B / IPV 10/04/2014  . HiB (PRP-T) 10/04/2014  . Influenza,inj,Quad PF,6+ Mos 11/04/2013, 10/04/2014  . Influenza-Unspecified 09/14/2018  . Moderna Sars-Covid-2 Vaccination 03/08/2020, 04/10/2020  . Pneumococcal Conjugate-13 10/04/2014    Diabetes She presents for her follow-up diabetic visit. She has type 2 diabetes mellitus. Onset time: She was diagnosed at  approximate age of 59 years. Her disease course has been improving. There are no hypoglycemic associated symptoms. Pertinent negatives for hypoglycemia include no confusion, headaches, pallor or seizures. Pertinent negatives for diabetes include no chest pain, no fatigue, no polydipsia, no polyphagia and no polyuria. There are no hypoglycemic complications. Symptoms are improving. There are no diabetic complications. Risk factors for coronary artery disease include diabetes mellitus, dyslipidemia, family history, obesity, hypertension, tobacco exposure, sedentary lifestyle and post-menopausal. Current diabetic treatment includes insulin injections (She is currently on Lantus 60 units nightly, Metformin 1000 mg  p.o. twice daily.). Her weight is increasing steadily. She is following a generally unhealthy diet. When asked about meal planning, she reported none. She has not had a previous visit with a dietitian. She rarely participates in exercise. Her home blood glucose trend is decreasing steadily. (She presents with continued improvement in her glycemic profile and point-of-care A1c of 5.8%, overall improving from 14%. She is only on Metformin 500 mg p.o. twice daily.     ) An ACE inhibitor/angiotensin II receptor blocker is being taken. Eye exam is current.  Hyperlipidemia This is a chronic problem. The current episode started more than 1 year ago. Exacerbating diseases include diabetes. Factors aggravating her hyperlipidemia include smoking. Pertinent negatives include no chest pain, myalgias or shortness of breath. Current antihyperlipidemic treatment includes statins. Risk factors for coronary artery disease include dyslipidemia, diabetes mellitus, hypertension, obesity, a sedentary lifestyle, post-menopausal and family history.  Hypertension This is a chronic problem. The current episode started more than 1 year ago. Pertinent negatives include no chest pain, headaches, palpitations or shortness of  breath. Risk factors for coronary artery disease include diabetes mellitus, dyslipidemia, family history, obesity, post-menopausal state, smoking/tobacco exposure and sedentary lifestyle. Past treatments include angiotensin blockers.    Review of Systems  Constitutional: Negative for chills, fatigue, fever and unexpected weight change.  HENT: Negative for trouble swallowing and voice change.   Eyes: Negative for visual disturbance.  Respiratory: Negative for cough, shortness of breath and wheezing.   Cardiovascular: Negative for chest pain, palpitations and leg swelling.  Gastrointestinal: Negative for diarrhea, nausea and vomiting.  Endocrine: Negative for cold intolerance, heat intolerance, polydipsia, polyphagia and polyuria.  Musculoskeletal: Negative for arthralgias and myalgias.  Skin: Negative for color change, pallor, rash and wound.  Neurological: Negative for seizures and headaches.  Psychiatric/Behavioral: Negative for confusion and suicidal ideas.    Objective:    Vitals with BMI 11/30/2020 08/29/2020 05/29/2020  Height 5' 8"  5' 8"  5' 8"   Weight 242 lbs 229 lbs 13 oz 198 lbs 6 oz  BMI 36.8 70.01 74.94  Systolic 496 759 163  Diastolic 72 74 74  Pulse 68 72 91    BP 126/72   Pulse 68   Ht 5' 8"  (1.727 m)   Wt 242 lb (109.8 kg)   BMI 36.80 kg/m   Wt Readings from Last 3 Encounters:  11/30/20 242 lb (109.8 kg)  08/29/20 229 lb 12.8 oz (104.2 kg)  05/29/20 198 lb 6.4 oz (90 kg)     Physical Exam Constitutional:      Appearance: She is well-developed.  HENT:     Head: Normocephalic and atraumatic.  Neck:     Thyroid: No thyromegaly.     Trachea: No tracheal deviation.  Cardiovascular:     Rate and Rhythm: Normal rate and regular rhythm.  Pulmonary:     Effort: Pulmonary effort is normal.  Abdominal:     Palpations: Abdomen is soft.     Tenderness: There is no abdominal tenderness. There is no guarding.  Musculoskeletal:        General: Normal range of  motion.     Cervical back: Normal range of motion and neck supple.  Skin:    General: Skin is warm and dry.     Coloration: Skin is not pale.     Findings: No erythema or rash.  Neurological:     Mental Status: She is alert and oriented to person, place, and time.     Cranial Nerves: No cranial nerve  deficit.     Coordination: Coordination normal.     Deep Tendon Reflexes: Reflexes are normal and symmetric.  Psychiatric:        Judgment: Judgment normal.      CMP ( most recent) CMP     Component Value Date/Time   NA 144 11/05/2020 0927   NA 144 09/04/2017 0916   K 4.3 11/05/2020 0927   K 4.1 09/04/2017 0916   CL 105 11/05/2020 0927   CO2 23 11/05/2020 0927   CO2 26 09/04/2017 0916   GLUCOSE 102 (H) 11/05/2020 0927   GLUCOSE 280 (H) 05/04/2020 0414   GLUCOSE 197 (H) 09/04/2017 0916   BUN 14 11/05/2020 0927   BUN 15.6 09/04/2017 0916   CREATININE 1.07 (H) 11/05/2020 0927   CREATININE 0.84 04/05/2020 0900   CREATININE 1.0 09/04/2017 0916   CALCIUM 10.1 11/05/2020 0927   CALCIUM 9.5 09/04/2017 0916   PROT 7.2 11/05/2020 0927   PROT 6.8 09/04/2017 0916   ALBUMIN 4.5 11/05/2020 0927   ALBUMIN 3.8 09/04/2017 0916   AST 19 11/05/2020 0927   AST 18 04/05/2020 0900   AST 19 09/04/2017 0916   ALT 17 11/05/2020 0927   ALT 14 04/05/2020 0900   ALT 18 09/04/2017 0916   ALKPHOS 86 11/05/2020 0927   ALKPHOS 49 09/04/2017 0916   BILITOT 0.4 11/05/2020 0927   BILITOT 0.3 04/05/2020 0900   BILITOT 0.49 09/04/2017 0916   GFRNONAA 57 (L) 11/05/2020 0927   GFRNONAA >60 04/05/2020 0900   GFRAA 66 11/05/2020 0927   GFRAA >60 04/05/2020 0900     Diabetic Labs (most recent): Lab Results  Component Value Date   HGBA1C 5.8 11/30/2020   HGBA1C 7.4 (H) 04/22/2020   HGBA1C 9.9 (A) 02/20/2020    Lab Results  Component Value Date   TSH 3.770 11/05/2020   TSH 2.859 05/06/2013   FREET4 0.79 (L) 11/05/2020   FREET4 0.93 05/06/2013     Assessment & Plan:   1. Type 2 diabetes  mellitus with hyperglycemia, without long-term current use of insulin (HCC)  - Khadijah L Iwasaki has currently uncontrolled symptomatic type 2 DM since  59 years of age.  She presents with continued improvement in her glycemic profile and point-of-care A1c of 5.8%, overall improving from 14%. She is only on Metformin 500 mg p.o. twice daily.   -- Recent labs reviewed.  Her diabetes is recently exacerbated by concurrent use of steroids during treatment with chemo for lymphoma. - I had a long discussion with her about the progressive nature of diabetes and the pathology behind its complications. -her diabetes is complicated by obesity/sedentary life and she remains at a high risk for more acute and chronic complications which include CAD, CVA, CKD, retinopathy, and neuropathy. These are all discussed in detail with her.  - I have counseled her on diet  and weight management  by adopting a carbohydrate restricted/protein rich diet. Patient is encouraged to switch to  unprocessed or minimally processed     complex starch and increased protein intake (animal or plant source), fruits, and vegetables. -  she is advised to stick to a routine mealtimes to eat 3 meals  a day and avoid unnecessary snacks ( to snack only to correct hypoglycemia).   - she acknowledges that there is a room for improvement in her food and drink choices. - Suggestion is made for her to avoid simple carbohydrates  from her diet including Cakes, Sweet Desserts, Ice Cream, Soda (diet and regular),  Sweet Tea, Candies, Chips, Cookies, Store Bought Juices, Alcohol in Excess of  1-2 drinks a day, Artificial Sweeteners,  Coffee Creamer, and "Sugar-free" Products, Lemonade. This will help patient to have more stable blood glucose profile and potentially avoid unintended weight gain.   - she will be scheduled with Jearld Fenton, RDN, CDE for diabetes education.  - I have approached her with the following individualized plan to manage  her  diabetes and patient agrees:   -Based on her presentation with controlled glycemic profile and point-of-care A1c of 5.8%, she will not need insulin treatment for now.  -She will be continued only on Metformin 500 mg p.o. daily after breakfast. She does not have to monitor blood glucose. She will have point-of-care A1c during her next visit in 6 months.  - she will be considered for incretin therapy as appropriate next visit.  - Specific targets for  A1c;  LDL, HDL,  and Triglycerides were discussed with the patient.  2) Blood Pressure /Hypertension:  -Her blood pressure is controlled to target. she is advised to continue her current medications including losartan 25 mg p.o. daily with breakfast . 3) Lipids/Hyperlipidemia: She does not have recent lipid panel to review.  She is advised to continue Crestor 10 mg p.o. daily at bedtime.  Side effects and precautions discussed with her.  4)  Weight/Diet:  Body mass index is 36.8 kg/m.  -   clearly complicating her diabetes care.   she is  a candidate for weight loss. I discussed with her the fact that loss of 5 - 10% of her  current body weight will have the most impact on her diabetes management.  Exercise, and detailed carbohydrates information provided  -  detailed on discharge instructions.  5) Chronic Care/Health Maintenance:  -she  is on ACEI/ARB and Statin medications and  is encouraged to initiate and continue to follow up with Ophthalmology, Dentist,  Podiatrist at least yearly or according to recommendations, and advised to   stay away from smoking. I have recommended yearly flu vaccine and pneumonia vaccine at least every 5 years; moderate intensity exercise for up to 150 minutes weekly; and  sleep for at least 7 hours a day.  - she is  advised to maintain close follow up with Rosita Fire, MD for primary care needs, as well as her other providers for optimal and coordinated care.   - Time spent on this patient care encounter:  35 min,  of which > 50% was spent in  counseling and the rest reviewing her blood glucose logs , discussing her hypoglycemia and hyperglycemia episodes, reviewing her current and  previous labs / studies  ( including abstraction from other facilities) and medications  doses and developing a  long term treatment plan and documenting her care.   Please refer to Patient Instructions for Blood Glucose Monitoring and Insulin/Medications Dosing Guide"  in media tab for additional information. Please  also refer to " Patient Self Inventory" in the Media  tab for reviewed elements of pertinent patient history.  [name] participated in the discussions, expressed understanding, and voiced agreement with the above plans.  All questions were answered to her satisfaction. she is encouraged to contact clinic should she have any questions or concerns prior to her return visit.   Follow up plan: - Return in about 6 months (around 05/31/2021) for Urine MA - NV, A1c -NV, F/U with Pre-visit Labs, Meter, Logs, A1c here.Glade Lloyd, MD [place]  [facility] [address] [place], [place] [address] Phone: [phone]  Fax: [phone]    11/30/2020, 11:20 AM  This note was partially dictated with voice recognition software. Similar sounding words can be transcribed inadequately or may not  be corrected upon review.

## 2020-12-04 ENCOUNTER — Other Ambulatory Visit: Payer: Self-pay

## 2020-12-04 ENCOUNTER — Ambulatory Visit (HOSPITAL_COMMUNITY): Payer: Medicare Other | Admitting: Physical Therapy

## 2020-12-04 ENCOUNTER — Encounter (HOSPITAL_COMMUNITY): Payer: Self-pay | Admitting: Physical Therapy

## 2020-12-04 DIAGNOSIS — M25512 Pain in left shoulder: Secondary | ICD-10-CM | POA: Diagnosis not present

## 2020-12-04 DIAGNOSIS — R262 Difficulty in walking, not elsewhere classified: Secondary | ICD-10-CM | POA: Diagnosis not present

## 2020-12-04 DIAGNOSIS — M25612 Stiffness of left shoulder, not elsewhere classified: Secondary | ICD-10-CM

## 2020-12-04 DIAGNOSIS — G8929 Other chronic pain: Secondary | ICD-10-CM | POA: Diagnosis not present

## 2020-12-04 DIAGNOSIS — M6281 Muscle weakness (generalized): Secondary | ICD-10-CM

## 2020-12-04 DIAGNOSIS — M5441 Lumbago with sciatica, right side: Secondary | ICD-10-CM | POA: Diagnosis not present

## 2020-12-04 NOTE — Therapy (Signed)
[place], [place], [address] Phone: [phone]   Fax:  [phone]  Physical Therapy Treatment  Patient Details  Name: Megan Mcknight MRN: [id number] Date of Birth: [birth year] Referring Provider (PT):  Felisa Bonier   Encounter Date: 12/04/2020   PT End of Session - 12/04/20 1413    Visit Number 13    Number of Visits 20    Date for PT Re-Evaluation 01/01/21    Authorization Type Medicare    Progress Note Due on Visit 22    PT Start Time 1414    PT Stop Time 1442    PT Time Calculation (min) 28 min    Activity Tolerance Patient tolerated treatment well    Behavior During Therapy [facility] for tasks assessed/performed           Past Medical History:  Diagnosis Date  . Acute bronchitis   . Anaplastic large cell lymphoma 2014   remission since 2015  . Asthma   . Cancer ([place])    lung and kidney  . Colon polyps 02/2013   Per colonoscopy  . Coma ([place])   . Diabetes mellitus without complication ([place])   . Diverticulosis 02/2013   Per colonoscopy  . Dysrhythmia   . Former light tobacco smoker   . GERD (gastroesophageal reflux disease)   . H/O stem cell transplant ([place]) 2015  . Hyperlipidemia   . Nerve pain    Left leg  . Non Hodgkin's lymphoma ([place])   . Seasonal allergies     Past Surgical History:  Procedure Laterality Date  . bones removed from Baby toes    . BRONCHIAL BRUSHINGS  04/25/2020   Procedure: BRONCHIAL BRUSHINGS;  Surgeon: Rigoberto Noel, MD;  Location: [place];  Service: Cardiopulmonary;;  . BRONCHIAL WASHINGS  04/25/2020   Procedure: BRONCHIAL WASHINGS;  Surgeon: Rigoberto Noel, MD;  Location: [place];  Service: Cardiopulmonary;;  . COLONOSCOPY  02/16/2012   Procedure: COLONOSCOPY;  Surgeon: Daneil Dolin, MD;  [facility];  Service: Endoscopy;  Laterality: N/A;  1:30  . COLONOSCOPY N/A 03/26/2017   Procedure: COLONOSCOPY;  Surgeon: Daneil Dolin, MD;  [facility];  Service: Endoscopy;  Laterality: N/A;  930  . IR IMAGING GUIDED PORT INSERTION  12/19/2019  . LUNG BIOPSY  04/25/2020   Procedure: LUNG BIOPSY;  Surgeon: Rigoberto Noel, MD;  Location: [facility] ENDOSCOPY;  Service: Cardiopulmonary;;  . MEDIASTINOSCOPY N/A 05/13/2013   Procedure: MEDIASTINOSCOPY;  Surgeon: Melrose Nakayama, MD;  Location: [place];  Service: Thoracic;  Laterality: N/A;  . PARTIAL HYSTERECTOMY    . VAGINAL HYSTERECTOMY     partial hyst   . VIDEO BRONCHOSCOPY N/A 04/25/2020   Procedure: VIDEO BRONCHOSCOPY WITH FLUORO;  Surgeon: Rigoberto Noel, MD;  Location: [place];  Service: Cardiopulmonary;  Laterality: N/A;  . VIDEO BRONCHOSCOPY WITH ENDOBRONCHIAL ULTRASOUND N/A 05/13/2013   Procedure: VIDEO BRONCHOSCOPY WITH ENDOBRONCHIAL ULTRASOUND;  Surgeon: Melrose Nakayama, MD;  Location: [place];  Service: Thoracic;  Laterality: N/A;    There were no vitals filed for this visit.   Subjective Assessment - 12/04/20 1417    Subjective Patient reports her back is doing well and that she has been able to walk for increased time without issue.  Left shoulder continues to give her some issues and awakens her if sidelying left at night.    Patient Stated Goals to be able to return to her exercise  program and to be stronger    Pain Onset More than a month ago              [facility] PT Assessment - 12/04/20 0001      Assessment   Medical Diagnosis LBP secondary to pathological fracture at L5     Referring Provider (PT)  Clint Bolder Adult PT Treatment/Exercise - 12/04/20 0001      Exercises   Exercises Shoulder      Lumbar Exercises: Standing   Scapular Retraction Strengthening;Both;15 reps;Theraband    Theraband Level (Scapular Retraction) Level 4 (Blue)    Scapular Retraction Limitations 2 sets    Row Both;15 reps;Theraband    Theraband Level (Row) Level 4 (Blue)    Row Limitations --   2 sets   Shoulder  Extension Both;15 reps;Theraband    Theraband Level (Shoulder Extension) Level 4 (Blue)    Shoulder Extension Limitations 2 sets    Other Standing Lumbar Exercises Paloff x 20 BTB; side step x 2 RT with GTB      Shoulder Exercises: Standing   Other Standing Exercises scaption 2 lbs 2x10   back against the wall for posture     Shoulder Exercises: Stretch   Corner Stretch 3 reps;60 seconds      Shoulder Exercises: Power Warehouse manager Other (comment)   3x10 plate #5   Other Power Copy with pulling   5 plates                   PT Short Term Goals - 11/20/20 1036      PT SHORT TERM GOAL #1   Title Patient will be able to roll over in bed from supine to prone without pain or difficulties to demonstrate improved transitional mobility    Baseline able to perform with no pain or difficulty    Time 4    Period Weeks    Status Achieved    Target Date 11/06/20      PT SHORT TERM GOAL #2   Title Patient will report at least 25% improvement in overall symptoms and/or function to demonstrate improved functional mobility    Baseline 55-60% better    Time 4    Period Weeks    Status Achieved    Target Date 11/06/20      PT SHORT TERM GOAL #3   Title Patient will be independent in self management strategies to improve quality of life and functional outcomes.    Time 4    Period Weeks    Status Achieved    Target Date 11/06/20             PT Long Term Goals - 11/20/20 1037      PT LONG TERM GOAL #1   Title Patient will be able to transition from sit to stand without use of upper extremities and without difficulty to demonstrate improved transitional mobility    Time 8    Period Weeks    Status Achieved      PT LONG TERM GOAL #2   Title Patient will improve on FOTO score to meet predicted outcomes to demonstrate improved functional mobility.    Time 8    Period Weeks    Status Achieved      PT LONG TERM GOAL #3   Title  Patient will report at  least 50% improvement in overall symptoms and/or function to demonstrate improved functional mobility    Baseline 55-60% better    Time 8    Period Weeks    Status Achieved      PT LONG TERM GOAL #4   Title Patient will report at least 75% improvement in overall symptoms and/or function to demonstrate improved functional mobility    Baseline in regards to her back    Time 5    Period Weeks    Status Achieved      PT LONG TERM GOAL #5   Title Patient will be able hold laundry basket with both hands and ascend stairs to improve ability to go up stairs at home from laundry area    Baseline currently places basket on stairs and takes stairs one at a time    Time 6    Period Weeks    Status New    Target Date 01/01/21      Additional Long Term Goals   Additional Long Term Goals Yes      PT LONG TERM GOAL #6   Title Patient will be able to grab soup can (light cane) from shelf overhead with left arm at least 5x time to demonstrate improved function in the kitchen.    Time 6    Period Weeks    Status New    Target Date 01/01/21      PT LONG TERM GOAL #7   Title Patient will be able reach to L4 SP with left arm to improve ability to wash back and pull up pants.    Time 6    Period Weeks    Status New    Target Date 01/01/21                 Plan - 12/04/20 1423    Clinical Impression Statement Patient demonstrates improved activity tolerance and reports left shoulder pain has decreased and localized to the front of the shoulder.  Pain awakens her at night if left sidelying.  Patient tolerated increased resistance and additional exercises without adverse effect.  Patient would benefit from continued PT services to increase left shoulder pain-free movement and increase strength to facilitate improved scapular kinematics for overhead movements    Personal Factors and Comorbidities Comorbidity 1;Comorbidity 2;Comorbidity 3+    Comorbidities lymphoma, hx of chemo, DB     Examination-Activity Limitations Bed Mobility;Bend;Squat;Stairs;Stand;Transfers;Locomotion Level;Lift;Sit    Examination-Participation Restrictions Cleaning;Community Activity;Meal Prep;Laundry;Shop    Stability/Clinical Decision Making Evolving/Moderate complexity    Rehab Potential Good    PT Frequency Other (comment)   1-2x/week for total of 8 visits over 6 week certification   PT Duration 6 weeks    PT Treatment/Interventions ADLs/Self Care Home Management;Aquatic Therapy;Cryotherapy;Moist Heat;Traction;Balance training;Therapeutic exercise;Therapeutic activities;Functional mobility training;Stair training;Gait training;Neuromuscular re-education;Patient/family education;Manual techniques;Joint Manipulations    PT Next Visit Plan Focus on arthrokinematics and scapular protraction, isometrics of shoulder, rows and scaption   PT Home Exercise Plan prone knee bends, bed rolling; 10/28 SLR, bridges, TRA activation, hamstring curls, shoulder flexion with dowel; 11/6 shoulder extension with dowel, clamshell; 11/22 chair pose, lateral stepping 12/l1:  GTB and postural 3/palloff . 12/21: scaption, corner stretch   Consulted and Agree with Plan of Care Patient           Patient will benefit from skilled therapeutic intervention in order to improve the following deficits and impairments:  Decreased endurance,Pain,Decreased strength,Decreased activity tolerance,Decreased balance,Decreased mobility,Difficulty walking,Decreased range of motion  Visit Diagnosis: Chronic left shoulder pain  Decreased range of motion of left shoulder  Muscle weakness (generalized)     Problem List Patient Active Problem List   Diagnosis Date Noted  . Acute right-sided low back pain   . S/P bronchoscopy   . FUO (fever of unknown origin) 04/26/2020  . Lung nodules   . Sepsis due to undetermined organism ([place]) 04/22/2020  . Lobar pneumonia ([place]) 04/22/2020  . 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 Sepsis due to pneumonia ([place]) 04/21/2020  . Type 2 diabetes mellitus with hyperglycemia, without long-term current use of insulin ([place]) 02/20/2020  . Essential hypertension, benign 02/20/2020  . Mixed hyperlipidemia 02/20/2020  . Port-A-Cath in place 01/12/2020  . Trichomonal infection 02/17/2019  . PTTD (posterior tibial tendon dysfunction) 08/21/2015  . Transaminitis 05/31/2014  . Inguinal adenopathy 10/20/2013  . Breast mass, left 10/20/2013  . Neuropathy due to chemotherapeutic drug ([place]) 09/29/2013  . Tumor lysis syndrome 08/21/2013  . Hyperglycemia without ketosis 08/21/2013  . Acute renal failure ([place]) 08/21/2013  . Acute encephalopathy 08/21/2013  . Hyperkalemia 08/21/2013  . Lymphoma ([place]) 05/19/2013  . Shortness of breath 05/06/2013  . Atrial fibrillation with RVR ([place]) 05/06/2013  . Bilateral pneumonia 05/06/2013  . Acute renal insufficiency 05/06/2013  . Normocytic anemia 05/06/2013  . GERD (gastroesophageal reflux disease) 02/02/2012    2:46 PM, 12/04/20 M. Sherlyn Lees, PT, DPT Physical Therapist- [place] Office Number: [phone]  [place] [address] [place], [place], [address] Phone: [phone]   Fax:  [phone]  Name: Megan Mcknight MRN: [id number] Date of Birth: [birth year]

## 2020-12-11 ENCOUNTER — Encounter (HOSPITAL_COMMUNITY): Payer: Self-pay

## 2020-12-11 ENCOUNTER — Other Ambulatory Visit: Payer: Self-pay

## 2020-12-11 ENCOUNTER — Ambulatory Visit (HOSPITAL_COMMUNITY): Payer: Medicare Other

## 2020-12-11 DIAGNOSIS — M25612 Stiffness of left shoulder, not elsewhere classified: Secondary | ICD-10-CM | POA: Diagnosis not present

## 2020-12-11 DIAGNOSIS — G8929 Other chronic pain: Secondary | ICD-10-CM | POA: Diagnosis not present

## 2020-12-11 DIAGNOSIS — M5441 Lumbago with sciatica, right side: Secondary | ICD-10-CM | POA: Diagnosis not present

## 2020-12-11 DIAGNOSIS — M6281 Muscle weakness (generalized): Secondary | ICD-10-CM

## 2020-12-11 DIAGNOSIS — Z23 Encounter for immunization: Secondary | ICD-10-CM | POA: Diagnosis not present

## 2020-12-11 DIAGNOSIS — R262 Difficulty in walking, not elsewhere classified: Secondary | ICD-10-CM | POA: Diagnosis not present

## 2020-12-11 DIAGNOSIS — M25512 Pain in left shoulder: Secondary | ICD-10-CM | POA: Diagnosis not present

## 2020-12-11 NOTE — Therapy (Signed)
[place], [place], [address] Phone: [phone]   Fax:  [phone]  Physical Therapy Treatment  Patient Details  Name: Megan Mcknight MRN: [id number] Date of Birth: [birth year] Referring Provider (PT):  Felisa Bonier   Encounter Date: 12/11/2020   PT End of Session - 12/11/20 0818    Visit Number 14    Number of Visits 20    Date for PT Re-Evaluation 01/01/21    Authorization Type Medicare    Progress Note Due on Visit 22    PT Start Time 0817    PT Stop Time 0857    PT Time Calculation (min) 40 min    Activity Tolerance Patient tolerated treatment well    Behavior During Therapy [facility] for tasks assessed/performed           Past Medical History:  Diagnosis Date  . Acute bronchitis   . Anaplastic large cell lymphoma 2014   remission since 2015  . Asthma   . Cancer ([place])    lung and kidney  . Colon polyps 02/2013   Per colonoscopy  . Coma ([place])   . Diabetes mellitus without complication ([place])   . Diverticulosis 02/2013   Per colonoscopy  . Dysrhythmia   . Former light tobacco smoker   . GERD (gastroesophageal reflux disease)   . H/O stem cell transplant ([place]) 2015  . Hyperlipidemia   . Nerve pain    Left leg  . Non Hodgkin's lymphoma ([place])   . Seasonal allergies     Past Surgical History:  Procedure Laterality Date  . bones removed from Baby toes    . BRONCHIAL BRUSHINGS  04/25/2020   Procedure: BRONCHIAL BRUSHINGS;  Surgeon: Rigoberto Noel, MD;  Location: [place];  Service: Cardiopulmonary;;  . BRONCHIAL WASHINGS  04/25/2020   Procedure: BRONCHIAL WASHINGS;  Surgeon: Rigoberto Noel, MD;  Location: [place];  Service: Cardiopulmonary;;  . COLONOSCOPY  02/16/2012   Procedure: COLONOSCOPY;  Surgeon: Daneil Dolin, MD;  [facility];  Service: Endoscopy;  Laterality: N/A;  1:30  . COLONOSCOPY N/A 03/26/2017   Procedure: COLONOSCOPY;  Surgeon: Daneil Dolin, MD;  [facility];  Service: Endoscopy;  Laterality: N/A;  930  . IR IMAGING GUIDED PORT INSERTION  12/19/2019  . LUNG BIOPSY  04/25/2020   Procedure: LUNG BIOPSY;  Surgeon: Rigoberto Noel, MD;  Location[facility] ENDOSCOPY;  Service: Cardiopulmonary;;  . MEDIASTINOSCOPY N/A 05/13/2013   Procedure: MEDIASTINOSCOPY;  Surgeon: Melrose Nakayama, MD;  Location: [place];  Service: Thoracic;  Laterality: N/A;  . PARTIAL HYSTERECTOMY    . VAGINAL HYSTERECTOMY     partial hyst   . VIDEO BRONCHOSCOPY N/A 04/25/2020   Procedure: VIDEO BRONCHOSCOPY WITH FLUORO;  Surgeon: Rigoberto Noel, MD;  Location: [place];  Service: Cardiopulmonary;  Laterality: N/A;  . VIDEO BRONCHOSCOPY WITH ENDOBRONCHIAL ULTRASOUND N/A 05/13/2013   Procedure: VIDEO BRONCHOSCOPY WITH ENDOBRONCHIAL ULTRASOUND;  Surgeon: Melrose Nakayama, MD;  Location: [place];  Service: Thoracic;  Laterality: N/A;    There were no vitals filed for this visit.   Subjective Assessment - 12/11/20 0820    Subjective Reports one day where her back bothered her, but was able to recover with active rest.  Left shoulder still causing discomfort and waking her in the night if left sidelying.    Patient Stated Goals to be able to return to her exercise program and to be stronger  Currently in Pain? No/denies    Pain Score 0-No pain    Pain Location Shoulder    Pain Orientation Left    Pain Onset More than a month ago              [facility] PT Assessment - 12/11/20 0001      Assessment   Referring Provider (PT)  Felisa Bonier                         [facility] Adult PT Treatment/Exercise - 12/11/20 0001      Lumbar Exercises: Quadruped   Other Quadruped Lumbar Exercises weight shift with neutral pelvis 2x5 reps BUE, BLE      Shoulder Exercises: Seated   Row Weight (lbs) 10 plates with bar 1L24 reps    Other Seated Exercises UBE x 3 min for UE warmup      Shoulder Exercises: Standing   Theraband Level (Shoulder External Rotation)  Level 3 (Green)   2x10 high-row to external rotation   Row Weight (lbs) single arm high row with 7 plates 3x10    Other Standing Exercises scaption 2 lbs 2x10                  PT Education - 12/11/20 0837    Education Details pt educated on beneficial strengthening machines at [employer] for shoulder strengthening    Person(s) Educated Patient    Methods Explanation    Comprehension Verbalized understanding;Returned demonstration            PT Short Term Goals - 11/20/20 1036      PT SHORT TERM GOAL #1   Title Patient will be able to roll over in bed from supine to prone without pain or difficulties to demonstrate improved transitional mobility    Baseline able to perform with no pain or difficulty    Time 4    Period Weeks    Status Achieved    Target Date 11/06/20      PT SHORT TERM GOAL #2   Title Patient will report at least 25% improvement in overall symptoms and/or function to demonstrate improved functional mobility    Baseline 55-60% better    Time 4    Period Weeks    Status Achieved    Target Date 11/06/20      PT SHORT TERM GOAL #3   Title Patient will be independent in self management strategies to improve quality of life and functional outcomes.    Time 4    Period Weeks    Status Achieved    Target Date 11/06/20             PT Long Term Goals - 11/20/20 1037      PT LONG TERM GOAL #1   Title Patient will be able to transition from sit to stand without use of upper extremities and without difficulty to demonstrate improved transitional mobility    Time 8    Period Weeks    Status Achieved      PT LONG TERM GOAL #2   Title Patient will improve on FOTO score to meet predicted outcomes to demonstrate improved functional mobility.    Time 8    Period Weeks    Status Achieved      PT LONG TERM GOAL #3   Title Patient will report at least 50% improvement in overall symptoms and/or function to demonstrate improved functional mobility     Baseline  55-60% better    Time 8    Period Weeks    Status Achieved      PT LONG TERM GOAL #4   Title Patient will report at least 75% improvement in overall symptoms and/or function to demonstrate improved functional mobility    Baseline in regards to her back    Time 5    Period Weeks    Status Achieved      PT LONG TERM GOAL #5   Title Patient will be able hold laundry basket with both hands and ascend stairs to improve ability to go up stairs at home from laundry area    Baseline currently places basket on stairs and takes stairs one at a time    Time 6    Period Weeks    Status New    Target Date 01/01/21      Additional Long Term Goals   Additional Long Term Goals Yes      PT LONG TERM GOAL #6   Title Patient will be able to grab soup can (light cane) from shelf overhead with left arm at least 5x time to demonstrate improved function in the kitchen.    Time 6    Period Weeks    Status New    Target Date 01/01/21      PT LONG TERM GOAL #7   Title Patient will be able reach to L4 SP with left arm to improve ability to wash back and pull up pants.    Time 6    Period Weeks    Status New    Target Date 01/01/21                 Plan - 12/11/20 0906    Clinical Impression Statement Patient tolerating treatment sessions with increased resistance for left shoulder without adverse effects.  Requires consistent cues/instruction/demonstration for proper scapular setting and form when attempting more compound and dynamic movements/lifts to facilitate scapulo-humeral rythm and timing and also cues for form with onset of fatigue. Continue with UE PRE and add more for UE WBing and closed chain movements    Personal Factors and Comorbidities Comorbidity 1;Comorbidity 2;Comorbidity 3+    Comorbidities lymphoma, hx of chemo, DB    Examination-Activity Limitations Bed Mobility;Bend;Squat;Stairs;Stand;Transfers;Locomotion Level;Lift;Sit    Examination-Participation Restrictions  Cleaning;Community Activity;Meal Prep;Laundry;Shop    Stability/Clinical Decision Making Evolving/Moderate complexity    Rehab Potential Good    PT Frequency Other (comment)   1-2x/week for total of 8 visits over 6 week certification   PT Duration 6 weeks    PT Treatment/Interventions ADLs/Self Care Home Management;Aquatic Therapy;Cryotherapy;Moist Heat;Traction;Balance training;Therapeutic exercise;Therapeutic activities;Functional mobility training;Stair training;Gait training;Neuromuscular re-education;Patient/family education;Manual techniques;Joint Manipulations    PT Next Visit Plan RECERT TODAY to focus on left shoulder - waiting MD ORDER to start - focus on arthrokinematics adn scapular protraction, isometrics of shoulder    PT Home Exercise Plan prone knee bends, bed rolling; 10/28 SLR, bridges, TRA activation, hamstring curls, shoulder flexion with dowel; 11/6 shoulder extension with dowel, clamshell; 11/22 chair pose, lateral stepping 12/l1:  GTB and postural 3/palloff    Consulted and Agree with Plan of Care Patient           Patient will benefit from skilled therapeutic intervention in order to improve the following deficits and impairments:  Decreased endurance,Pain,Decreased strength,Decreased activity tolerance,Decreased balance,Decreased mobility,Difficulty walking,Decreased range of motion  Visit Diagnosis: Chronic left shoulder pain  Decreased range of motion of left shoulder  Muscle weakness (generalized)  Problem List Patient Active Problem List   Diagnosis Date Noted  . Acute right-sided low back pain   . S/P bronchoscopy   . FUO (fever of unknown origin) 04/26/2020  . Lung nodules   . Sepsis due to undetermined organism ([place]) 04/22/2020  . Lobar pneumonia ([place]) 04/22/2020  . 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 Sepsis due to pneumonia ([place]) 04/21/2020  . Type 2 diabetes mellitus with hyperglycemia, without  long-term current use of insulin ([place]) 02/20/2020  . Essential hypertension, benign 02/20/2020  . Mixed hyperlipidemia 02/20/2020  . Port-A-Cath in place 01/12/2020  . Trichomonal infection 02/17/2019  . PTTD (posterior tibial tendon dysfunction) 08/21/2015  . Transaminitis 05/31/2014  . Inguinal adenopathy 10/20/2013  . Breast mass, left 10/20/2013  . Neuropathy due to chemotherapeutic drug ([place]) 09/29/2013  . Tumor lysis syndrome 08/21/2013  . Hyperglycemia without ketosis 08/21/2013  . Acute renal failure ([place]) 08/21/2013  . Acute encephalopathy 08/21/2013  . Hyperkalemia 08/21/2013  . Lymphoma ([place]) 05/19/2013  . Shortness of breath 05/06/2013  . Atrial fibrillation with RVR ([place]) 05/06/2013  . Bilateral pneumonia 05/06/2013  . Acute renal insufficiency 05/06/2013  . Normocytic anemia 05/06/2013  . GERD (gastroesophageal reflux disease) 02/02/2012     9:11 AM, 12/11/20 M. Sherlyn Lees, PT, DPT Physical Therapist- [place] Office Number: [phone]  [facility] [address] [place], [place], [address] Phone: [phone]   Fax:  [phone]  Name: RAEDYN KLINCK MRN: [id number] Date of Birth: 02-17-1961

## 2020-12-11 NOTE — Patient Instructions (Signed)
Access Code: [email or web address] URL: [email or web address] Date: 12/11/2020 Prepared by: Sherlyn Lees  Exercises Staggered Stance Single Arm High Row with Trunk Rotation and Anchored Resistance - 1 x daily - 7 x weekly - 3 sets - 10 reps - 2 hold Seated High Shoulder Row with Anchored Resistance - 1 x daily - 7 x weekly - 3 sets - 10 reps - 2 hold Quadruped Alternating Arm Lift - 1 x daily - 7 x weekly - 3 sets - 5 reps

## 2020-12-13 ENCOUNTER — Telehealth (HOSPITAL_COMMUNITY): Payer: Self-pay

## 2020-12-13 ENCOUNTER — Ambulatory Visit (HOSPITAL_COMMUNITY): Payer: Medicare Other

## 2020-12-13 NOTE — Telephone Encounter (Signed)
pt cancelled appt because she took her COVID vaccine and is not feeling well

## 2020-12-18 ENCOUNTER — Other Ambulatory Visit: Payer: Self-pay

## 2020-12-18 ENCOUNTER — Encounter (HOSPITAL_COMMUNITY): Payer: Self-pay | Admitting: Physical Therapy

## 2020-12-18 ENCOUNTER — Ambulatory Visit (HOSPITAL_COMMUNITY): Payer: Medicare Other | Attending: Physician Assistant | Admitting: Physical Therapy

## 2020-12-18 DIAGNOSIS — M25612 Stiffness of left shoulder, not elsewhere classified: Secondary | ICD-10-CM | POA: Diagnosis not present

## 2020-12-18 DIAGNOSIS — M5441 Lumbago with sciatica, right side: Secondary | ICD-10-CM | POA: Insufficient documentation

## 2020-12-18 DIAGNOSIS — R262 Difficulty in walking, not elsewhere classified: Secondary | ICD-10-CM | POA: Diagnosis not present

## 2020-12-18 DIAGNOSIS — G8929 Other chronic pain: Secondary | ICD-10-CM | POA: Insufficient documentation

## 2020-12-18 DIAGNOSIS — M25512 Pain in left shoulder: Secondary | ICD-10-CM | POA: Diagnosis not present

## 2020-12-18 DIAGNOSIS — M6281 Muscle weakness (generalized): Secondary | ICD-10-CM | POA: Diagnosis not present

## 2020-12-18 NOTE — Therapy (Signed)
[place], [place], [address] Phone: [phone]   Fax:  [phone]  Physical Therapy Treatment  Patient Details  Name: Megan Mcknight MRN: [id number] Date of Birth: 1961-06-21 Referring Provider (PT):  Felisa Bonier   Encounter Date: 12/18/2020   PT End of Session - 12/18/20 1029    Visit Number 15    Number of Visits 20    Date for PT Re-Evaluation 01/01/21    Authorization Type Medicare    Progress Note Due on Visit 1    PT Start Time 1038    PT Stop Time 1118    PT Time Calculation (min) 40 min    Activity Tolerance Patient tolerated treatment well    Behavior During Therapy [facility] for tasks assessed/performed           Past Medical History:  Diagnosis Date  . Acute bronchitis   . Anaplastic large cell lymphoma 2014   remission since 2015  . Asthma   . Cancer ([place])    lung and kidney  . Colon polyps 02/2013   Per colonoscopy  . Coma ([place])   . Diabetes mellitus without complication ([place])   . Diverticulosis 02/2013   Per colonoscopy  . Dysrhythmia   . Former light tobacco smoker   . GERD (gastroesophageal reflux disease)   . H/O stem cell transplant ([place]) 2015  . Hyperlipidemia   . Nerve pain    Left leg  . Non Hodgkin's lymphoma ([place])   . Seasonal allergies     Past Surgical History:  Procedure Laterality Date  . bones removed from Baby toes    . BRONCHIAL BRUSHINGS  04/25/2020   Procedure: BRONCHIAL BRUSHINGS;  Surgeon: Rigoberto Noel, MD;  Location: [place];  Service: Cardiopulmonary;;  . BRONCHIAL WASHINGS  04/25/2020   Procedure: BRONCHIAL WASHINGS;  Surgeon: Rigoberto Noel, MD;  Location: [place];  Service: Cardiopulmonary;;  . COLONOSCOPY  02/16/2012   Procedure: COLONOSCOPY;  Surgeon: Daneil Dolin, MD;  [facility];  Service: Endoscopy;  Laterality: N/A;  1:30  . COLONOSCOPY N/A 03/26/2017   Procedure: COLONOSCOPY;  Surgeon: Daneil Dolin, MD;  [facility];  Service: Endoscopy;  Laterality: N/A;  930  . IR IMAGING GUIDED PORT INSERTION  12/19/2019  . LUNG BIOPSY  04/25/2020   Procedure: LUNG BIOPSY;  Surgeon: Rigoberto Noel, MD;  Location: [facility] ENDOSCOPY;  Service: Cardiopulmonary;;  . MEDIASTINOSCOPY N/A 05/13/2013   Procedure: MEDIASTINOSCOPY;  Surgeon: Melrose Nakayama, MD;  Location: [place];  Service: Thoracic;  Laterality: N/A;  . PARTIAL HYSTERECTOMY    . VAGINAL HYSTERECTOMY     partial hyst   . VIDEO BRONCHOSCOPY N/A 04/25/2020   Procedure: VIDEO BRONCHOSCOPY WITH FLUORO;  Surgeon: Rigoberto Noel, MD;  Location: [place];  Service: Cardiopulmonary;  Laterality: N/A;  . VIDEO BRONCHOSCOPY WITH ENDOBRONCHIAL ULTRASOUND N/A 05/13/2013   Procedure: VIDEO BRONCHOSCOPY WITH ENDOBRONCHIAL ULTRASOUND;  Surgeon: Melrose Nakayama, MD;  Location: [place];  Service: Thoracic;  Laterality: N/A;    There were no vitals filed for this visit.   Subjective Assessment - 12/18/20 1042    Subjective States that her arm has been feeling pretty good. Reports no pain today.    Patient Stated Goals to be able to return to her exercise program and to be stronger    Currently in Pain? No/denies    Pain Onset More than a month ago  [facility] PT Assessment - 12/18/20 0001      Assessment   Medical Diagnosis L shoulder pain    Referring Provider (PT)  Felisa Bonier                         [facility] Adult PT Treatment/Exercise - 12/18/20 0001      Shoulder Exercises: Supine   Theraband Level (Shoulder External Rotation) Level 2 (Red)   3x3 10" B     Shoulder Exercises: Standing   Internal Rotation Theraband;AROM   holds 10", 3x3 B   Extension AROM;Strengthening;Both;10 reps;Theraband   red, contiunous hold, 3 sets   Diagonals AROM;Strengthening;Both;15 reps   chops 3 sets, Red theraband   Other Standing Exercises diagonal lift red theraband, 3x10 B      Shoulder Exercises: Pulleys   Flexion 2 minutes                     PT Short Term Goals - 11/20/20 1036      PT SHORT TERM GOAL #1   Title Patient will be able to roll over in bed from supine to prone without pain or difficulties to demonstrate improved transitional mobility    Baseline able to perform with no pain or difficulty    Time 4    Period Weeks    Status Achieved    Target Date 11/06/20      PT SHORT TERM GOAL #2   Title Patient will report at least 25% improvement in overall symptoms and/or function to demonstrate improved functional mobility    Baseline 55-60% better    Time 4    Period Weeks    Status Achieved    Target Date 11/06/20      PT SHORT TERM GOAL #3   Title Patient will be independent in self management strategies to improve quality of life and functional outcomes.    Time 4    Period Weeks    Status Achieved    Target Date 11/06/20             PT Long Term Goals - 11/20/20 1037      PT LONG TERM GOAL #1   Title Patient will be able to transition from sit to stand without use of upper extremities and without difficulty to demonstrate improved transitional mobility    Time 8    Period Weeks    Status Achieved      PT LONG TERM GOAL #2   Title Patient will improve on FOTO score to meet predicted outcomes to demonstrate improved functional mobility.    Time 8    Period Weeks    Status Achieved      PT LONG TERM GOAL #3   Title Patient will report at least 50% improvement in overall symptoms and/or function to demonstrate improved functional mobility    Baseline 55-60% better    Time 8    Period Weeks    Status Achieved      PT LONG TERM GOAL #4   Title Patient will report at least 75% improvement in overall symptoms and/or function to demonstrate improved functional mobility    Baseline in regards to her back    Time 5    Period Weeks    Status Achieved      PT LONG TERM GOAL #5   Title Patient will be able hold laundry basket with both hands and ascend stairs to improve  ability to  go up stairs at home from laundry area    Baseline currently places basket on stairs and takes stairs one at a time    Time 6    Period Weeks    Status New    Target Date 01/01/21      Additional Long Term Goals   Additional Long Term Goals Yes      PT LONG TERM GOAL #6   Title Patient will be able to grab soup can (light cane) from shelf overhead with left arm at least 5x time to demonstrate improved function in the kitchen.    Time 6    Period Weeks    Status New    Target Date 01/01/21      PT LONG TERM GOAL #7   Title Patient will be able reach to L4 SP with left arm to improve ability to wash back and pull up pants.    Time 6    Period Weeks    Status New    Target Date 01/01/21                 Plan - 12/18/20 1115    Clinical Impression Statement Overall patient improving in ROM and strength, tolerated longer holds with strengthening exercises. Fatigue noted but no pain. Added chops and lifts to home program as these were tolerated well. Will continue to progress upper extremity ROM and strengthening as tolerated.    Personal Factors and Comorbidities Comorbidity 1;Comorbidity 2;Comorbidity 3+    Comorbidities lymphoma, hx of chemo, DB    Examination-Activity Limitations Bed Mobility;Bend;Squat;Stairs;Stand;Transfers;Locomotion Level;Lift;Sit    Examination-Participation Restrictions Cleaning;Community Activity;Meal Prep;Laundry;Shop    Stability/Clinical Decision Making Evolving/Moderate complexity    Rehab Potential Good    PT Frequency Other (comment)   1-2x/week for total of 8 visits over 6 week certification   PT Duration 6 weeks    PT Treatment/Interventions ADLs/Self Care Home Management;Aquatic Therapy;Cryotherapy;Moist Heat;Traction;Balance training;Therapeutic exercise;Therapeutic activities;Functional mobility training;Stair training;Gait training;Neuromuscular re-education;Patient/family education;Manual techniques;Joint Manipulations    PT  Next Visit Plan RECERT TODAY to focus on left shoulder - waiting MD ORDER to start - focus on arthrokinematics adn scapular protraction, isometrics of shoulder    PT Home Exercise Plan prone knee bends, bed rolling; 10/28 SLR, bridges, TRA activation, hamstring curls, shoulder flexion with dowel; 11/6 shoulder extension with dowel, clamshell; 11/22 chair pose, lateral stepping 12/l1:  GTB and postural 3/palloff; 1/4 chops/lifts with theraband    Consulted and Agree with Plan of Care Patient           Patient will benefit from skilled therapeutic intervention in order to improve the following deficits and impairments:  Decreased endurance,Pain,Decreased strength,Decreased activity tolerance,Decreased balance,Decreased mobility,Difficulty walking,Decreased range of motion  Visit Diagnosis: Chronic left shoulder pain  Decreased range of motion of left shoulder  Muscle weakness (generalized)  Chronic midline low back pain with right-sided sciatica  Difficulty in walking, not elsewhere classified     Problem List Patient Active Problem List   Diagnosis Date Noted  . Acute right-sided low back pain   . S/P bronchoscopy   . FUO (fever of unknown origin) 04/26/2020  . Lung nodules   . Sepsis due to undetermined organism ([place]) 04/22/2020  . Lobar pneumonia ([place]) 04/22/2020  . 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 Sepsis due to pneumonia ([place]) 04/21/2020  . Type 2 diabetes mellitus with hyperglycemia, without long-term current use of insulin ([place]) 02/20/2020  . Essential hypertension, benign 02/20/2020  .  Mixed hyperlipidemia 02/20/2020  . Port-A-Cath in place 01/12/2020  . Trichomonal infection 02/17/2019  . PTTD (posterior tibial tendon dysfunction) 08/21/2015  . Transaminitis 05/31/2014  . Inguinal adenopathy 10/20/2013  . Breast mass, left 10/20/2013  . Neuropathy due to chemotherapeutic drug ([place]) 09/29/2013  . Tumor lysis syndrome  08/21/2013  . Hyperglycemia without ketosis 08/21/2013  . Acute renal failure ([place]) 08/21/2013  . Acute encephalopathy 08/21/2013  . Hyperkalemia 08/21/2013  . Lymphoma ([place]) 05/19/2013  . Shortness of breath 05/06/2013  . Atrial fibrillation with RVR ([place]) 05/06/2013  . Bilateral pneumonia 05/06/2013  . Acute renal insufficiency 05/06/2013  . Normocytic anemia 05/06/2013  . GERD (gastroesophageal reflux disease) 02/02/2012   11:19 AM, 12/18/20 Jerene Pitch, DPT Physical Therapy with [facility]  [phone] office  [place] [address] [place], [place], [address] Phone: [phone]   Fax:  [phone]  Name: Megan Mcknight MRN: [id number] Date of Birth: April 12, 1961

## 2020-12-21 ENCOUNTER — Encounter (HOSPITAL_COMMUNITY): Payer: Self-pay

## 2020-12-21 ENCOUNTER — Other Ambulatory Visit: Payer: Self-pay

## 2020-12-21 ENCOUNTER — Ambulatory Visit (HOSPITAL_COMMUNITY): Payer: Medicare Other

## 2020-12-21 DIAGNOSIS — G8929 Other chronic pain: Secondary | ICD-10-CM | POA: Diagnosis not present

## 2020-12-21 DIAGNOSIS — M5441 Lumbago with sciatica, right side: Secondary | ICD-10-CM | POA: Diagnosis not present

## 2020-12-21 DIAGNOSIS — M25512 Pain in left shoulder: Secondary | ICD-10-CM

## 2020-12-21 DIAGNOSIS — M25612 Stiffness of left shoulder, not elsewhere classified: Secondary | ICD-10-CM | POA: Diagnosis not present

## 2020-12-21 DIAGNOSIS — R262 Difficulty in walking, not elsewhere classified: Secondary | ICD-10-CM

## 2020-12-21 DIAGNOSIS — M6281 Muscle weakness (generalized): Secondary | ICD-10-CM

## 2020-12-21 NOTE — Therapy (Signed)
[place], [place], [address] Phone: [phone]   Fax:  [phone]  Physical Therapy Treatment  Patient Details  Name: Megan Mcknight MRN: [id number] Date of Birth: Jun 05, 1961 Referring Provider (PT):  Felisa Bonier   Encounter Date: 12/21/2020   PT End of Session - 12/21/20 0930    Visit Number 16    Number of Visits 20    Date for PT Re-Evaluation 01/01/21    Authorization Type Medicare    Progress Note Due on Visit 22    PT Start Time 0920    PT Stop Time 0958    PT Time Calculation (min) 38 min    Activity Tolerance Patient tolerated treatment well    Behavior During Therapy [facility] for tasks assessed/performed           Past Medical History:  Diagnosis Date  . Acute bronchitis   . Anaplastic large cell lymphoma 2014   remission since 2015  . Asthma   . Cancer ([place])    lung and kidney  . Colon polyps 02/2013   Per colonoscopy  . Coma ([place])   . Diabetes mellitus without complication ([place])   . Diverticulosis 02/2013   Per colonoscopy  . Dysrhythmia   . Former light tobacco smoker   . GERD (gastroesophageal reflux disease)   . H/O stem cell transplant ([place]) 2015  . Hyperlipidemia   . Nerve pain    Left leg  . Non Hodgkin's lymphoma ([place])   . Seasonal allergies     Past Surgical History:  Procedure Laterality Date  . bones removed from Baby toes    . BRONCHIAL BRUSHINGS  04/25/2020   Procedure: BRONCHIAL BRUSHINGS;  Surgeon: Rigoberto Noel, MD;  Location: [place];  Service: Cardiopulmonary;;  . BRONCHIAL WASHINGS  04/25/2020   Procedure: BRONCHIAL WASHINGS;  Surgeon: Rigoberto Noel, MD;  Location: [place];  Service: Cardiopulmonary;;  . COLONOSCOPY  02/16/2012   Procedure: COLONOSCOPY;  Surgeon: Daneil Dolin, MD;  [facility];  Service: Endoscopy;  Laterality: N/A;  1:30  . COLONOSCOPY N/A 03/26/2017   Procedure: COLONOSCOPY;  Surgeon: Daneil Dolin, MD;  [facility];  Service: Endoscopy;  Laterality: N/A;  930  . IR IMAGING GUIDED PORT INSERTION  12/19/2019  . LUNG BIOPSY  04/25/2020   Procedure: LUNG BIOPSY;  Surgeon: Rigoberto Noel, MD;  Location: [facility] ENDOSCOPY;  Service: Cardiopulmonary;;  . MEDIASTINOSCOPY N/A 05/13/2013   Procedure: MEDIASTINOSCOPY;  Surgeon: Melrose Nakayama, MD;  Location: [place];  Service: Thoracic;  Laterality: N/A;  . PARTIAL HYSTERECTOMY    . VAGINAL HYSTERECTOMY     partial hyst   . VIDEO BRONCHOSCOPY N/A 04/25/2020   Procedure: VIDEO BRONCHOSCOPY WITH FLUORO;  Surgeon: Rigoberto Noel, MD;  Location: [place];  Service: Cardiopulmonary;  Laterality: N/A;  . VIDEO BRONCHOSCOPY WITH ENDOBRONCHIAL ULTRASOUND N/A 05/13/2013   Procedure: VIDEO BRONCHOSCOPY WITH ENDOBRONCHIAL ULTRASOUND;  Surgeon: Melrose Nakayama, MD;  Location: [place];  Service: Thoracic;  Laterality: N/A;    There were no vitals filed for this visit.   Subjective Assessment - 12/21/20 0929    Subjective Pt stated she is feeling good today, no reports of pain.  Reports improved tolerance wiht laying on Lt side at night.    Currently in Pain? No/denies  [facility] Adult PT Treatment/Exercise - 12/21/20 0001      Exercises   Exercises Shoulder      Shoulder Exercises: Standing   External Rotation 15 reps    Theraband Level (Shoulder External Rotation) Level 3 (Green)    External Rotation Limitations 5" holds    Extension 15 reps;Theraband    Theraband Level (Shoulder Extension) Level 3 (Green)    Row 15 reps;Theraband    Theraband Level (Shoulder Row) Level 3 (Green)    Diagonals AROM;Strengthening;Both;15 reps    Theraband Level (Shoulder Diagonals) Level 3 (Green)    Diagonals Limitations lift and chop    Other Standing Exercises Wall arch 15x with chin tuck    Other Standing Exercises UE flexionfront of wall 15x (chin tuck and ab set)      Shoulder Exercises: ROM/Strengthening   UBE  (Upper Arm Bike) 2' forward/2' backwards    Prot/Ret//Elev/Dep 10 protract/neutral/retract                    PT Short Term Goals - 11/20/20 1036      PT SHORT TERM GOAL #1   Title Patient will be able to roll over in bed from supine to prone without pain or difficulties to demonstrate improved transitional mobility    Baseline able to perform with no pain or difficulty    Time 4    Period Weeks    Status Achieved    Target Date 11/06/20      PT SHORT TERM GOAL #2   Title Patient will report at least 25% improvement in overall symptoms and/or function to demonstrate improved functional mobility    Baseline 55-60% better    Time 4    Period Weeks    Status Achieved    Target Date 11/06/20      PT SHORT TERM GOAL #3   Title Patient will be independent in self management strategies to improve quality of life and functional outcomes.    Time 4    Period Weeks    Status Achieved    Target Date 11/06/20             PT Long Term Goals - 11/20/20 1037      PT LONG TERM GOAL #1   Title Patient will be able to transition from sit to stand without use of upper extremities and without difficulty to demonstrate improved transitional mobility    Time 8    Period Weeks    Status Achieved      PT LONG TERM GOAL #2   Title Patient will improve on FOTO score to meet predicted outcomes to demonstrate improved functional mobility.    Time 8    Period Weeks    Status Achieved      PT LONG TERM GOAL #3   Title Patient will report at least 50% improvement in overall symptoms and/or function to demonstrate improved functional mobility    Baseline 55-60% better    Time 8    Period Weeks    Status Achieved      PT LONG TERM GOAL #4   Title Patient will report at least 75% improvement in overall symptoms and/or function to demonstrate improved functional mobility    Baseline in regards to her back    Time 5    Period Weeks    Status Achieved      PT LONG TERM GOAL #5    Title Patient will be able hold laundry basket with both  hands and ascend stairs to improve ability to go up stairs at home from laundry area    Baseline currently places basket on stairs and takes stairs one at a time    Time 6    Period Weeks    Status New    Target Date 01/01/21      Additional Long Term Goals   Additional Long Term Goals Yes      PT LONG TERM GOAL #6   Title Patient will be able to grab soup can (light cane) from shelf overhead with left arm at least 5x time to demonstrate improved function in the kitchen.    Time 6    Period Weeks    Status New    Target Date 01/01/21      PT LONG TERM GOAL #7   Title Patient will be able reach to L4 SP with left arm to improve ability to wash back and pull up pants.    Time 6    Period Weeks    Status New    Target Date 01/01/21                 Plan - 12/21/20 0955    Clinical Impression Statement Educated anatomy and importance of scapular mobility paired with UE movements.  Added mobility protract/retract to improve coordination with scapula, verbal and tactile cueing to reduce UT contraction and improve scapular retraction.  Continued wiht UE ROM and strenghtening exercises, able to increase reps wiht theraband exercises with good tolerance.  No reports of pain, did notice UE fatigue with activity.    Personal Factors and Comorbidities Comorbidity 1;Comorbidity 2;Comorbidity 3+    Comorbidities lymphoma, hx of chemo, DB    Examination-Activity Limitations Bed Mobility;Bend;Squat;Stairs;Stand;Transfers;Locomotion Level;Lift;Sit    Examination-Participation Restrictions Cleaning;Community Activity;Meal Prep;Laundry;Shop    Stability/Clinical Decision Making Evolving/Moderate complexity    Clinical Decision Making Moderate    Rehab Potential Good    PT Frequency --   1-2x/week for total of 8 visits over 6 week certification   PT Duration 6 weeks    PT Treatment/Interventions ADLs/Self Care Home Management;Aquatic  Therapy;Cryotherapy;Moist Heat;Traction;Balance training;Therapeutic exercise;Therapeutic activities;Functional mobility training;Stair training;Gait training;Neuromuscular re-education;Patient/family education;Manual techniques;Joint Manipulations    PT Next Visit Plan Continue wiht Lt shoulder mobility and strengthening.    PT Home Exercise Plan prone knee bends, bed rolling; 10/28 SLR, bridges, TRA activation, hamstring curls, shoulder flexion with dowel; 11/6 shoulder extension with dowel, clamshell; 11/22 chair pose, lateral stepping 12/l1:  GTB and postural 3/palloff; 1/4 chops/lifts with theraband           Patient will benefit from skilled therapeutic intervention in order to improve the following deficits and impairments:  Decreased endurance,Pain,Decreased strength,Decreased activity tolerance,Decreased balance,Decreased mobility,Difficulty walking,Decreased range of motion  Visit Diagnosis: Chronic left shoulder pain  Decreased range of motion of left shoulder  Muscle weakness (generalized)  Chronic midline low back pain with right-sided sciatica  Difficulty in walking, not elsewhere classified     Problem List Patient Active Problem List   Diagnosis Date Noted  . Acute right-sided low back pain   . S/P bronchoscopy   . FUO (fever of unknown origin) 04/26/2020  . Lung nodules   . Sepsis due to undetermined organism ([place]) 04/22/2020  . Lobar pneumonia ([place]) 04/22/2020  . 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 Sepsis due to pneumonia ([place]) 04/21/2020  . Type 2 diabetes mellitus with hyperglycemia, without long-term current use of insulin ([place]) 02/20/2020  .  Essential hypertension, benign 02/20/2020  . Mixed hyperlipidemia 02/20/2020  . Port-A-Cath in place 01/12/2020  . Trichomonal infection 02/17/2019  . PTTD (posterior tibial tendon dysfunction) 08/21/2015  . Transaminitis 05/31/2014  . Inguinal adenopathy 10/20/2013   . Breast mass, left 10/20/2013  . Neuropathy due to chemotherapeutic drug ([place]) 09/29/2013  . Tumor lysis syndrome 08/21/2013  . Hyperglycemia without ketosis 08/21/2013  . Acute renal failure ([place]) 08/21/2013  . Acute encephalopathy 08/21/2013  . Hyperkalemia 08/21/2013  . Lymphoma ([place]) 05/19/2013  . Shortness of breath 05/06/2013  . Atrial fibrillation with RVR ([place]) 05/06/2013  . Bilateral pneumonia 05/06/2013  . Acute renal insufficiency 05/06/2013  . Normocytic anemia 05/06/2013  . GERD (gastroesophageal reflux disease) 02/02/2012   Ihor Austin, LPTA/CLT; CBIS [phone]  Aldona Lento 12/21/2020, 10:04 AM  [place], [place], [address] Phone: [phone]   Fax:  [phone]  Name: IOMA CHISMAR MRN: [id number] Date of Birth: December 11, 1961

## 2020-12-24 DIAGNOSIS — E119 Type 2 diabetes mellitus without complications: Secondary | ICD-10-CM | POA: Diagnosis not present

## 2020-12-24 DIAGNOSIS — I10 Essential (primary) hypertension: Secondary | ICD-10-CM | POA: Diagnosis not present

## 2020-12-25 ENCOUNTER — Telehealth (HOSPITAL_COMMUNITY): Payer: Self-pay | Admitting: Physical Therapy

## 2020-12-25 ENCOUNTER — Ambulatory Visit (HOSPITAL_COMMUNITY): Payer: Medicare Other | Admitting: Physical Therapy

## 2020-12-25 ENCOUNTER — Other Ambulatory Visit: Payer: Self-pay

## 2020-12-25 ENCOUNTER — Encounter (HOSPITAL_COMMUNITY): Payer: Self-pay | Admitting: Physical Therapy

## 2020-12-25 DIAGNOSIS — G8929 Other chronic pain: Secondary | ICD-10-CM

## 2020-12-25 DIAGNOSIS — M6281 Muscle weakness (generalized): Secondary | ICD-10-CM | POA: Diagnosis not present

## 2020-12-25 DIAGNOSIS — M5441 Lumbago with sciatica, right side: Secondary | ICD-10-CM | POA: Diagnosis not present

## 2020-12-25 DIAGNOSIS — M25612 Stiffness of left shoulder, not elsewhere classified: Secondary | ICD-10-CM

## 2020-12-25 DIAGNOSIS — M25512 Pain in left shoulder: Secondary | ICD-10-CM

## 2020-12-25 DIAGNOSIS — R262 Difficulty in walking, not elsewhere classified: Secondary | ICD-10-CM | POA: Diagnosis not present

## 2020-12-25 NOTE — Therapy (Signed)
[place], [place], [address] Phone: [phone]   Fax:  [phone]  Physical Therapy Treatment  Patient Details  Name: Megan Mcknight MRN: [id number] Date of Birth: 29-May-1961 Referring Provider (PT):  Felisa Bonier PHYSICAL THERAPY DISCHARGE SUMMARY  Visits from Start of Care: 17  Current functional level related to goals / functional outcomes: PT has full use of shoulder    Remaining deficits: None    Education / Equipment: HEP  Plan: Patient agrees to discharge.  Patient goals were met. Patient is being discharged due to meeting the stated rehab goals.  ?????       Encounter Date: 12/25/2020   PT End of Session - 12/25/20 1115    Visit Number 17    Number of Visits 17    Authorization Type Medicare    PT Start Time 1100    PT Stop Time 1115    PT Time Calculation (min) 15 min    Activity Tolerance Patient tolerated treatment well    Behavior During Therapy WFL for tasks assessed/performed           Past Medical History:  Diagnosis Date  . Acute bronchitis   . Anaplastic large cell lymphoma 2014   remission since 2015  . Asthma   . Cancer ([place])    lung and kidney  . Colon polyps 02/2013   Per colonoscopy  . Coma ([place])   . Diabetes mellitus without complication ([place])   . Diverticulosis 02/2013   Per colonoscopy  . Dysrhythmia   . Former light tobacco smoker   . GERD (gastroesophageal reflux disease)   . H/O stem cell transplant ([place]) 2015  . Hyperlipidemia   . Nerve pain    Left leg  . Non Hodgkin's lymphoma ([place])   . Seasonal allergies     Past Surgical History:  Procedure Laterality Date  . bones removed from Baby toes    . BRONCHIAL BRUSHINGS  04/25/2020   Procedure: BRONCHIAL BRUSHINGS;  Surgeon: Rigoberto Noel, MD;  Location: [place];  Service: Cardiopulmonary;;  . BRONCHIAL WASHINGS  04/25/2020   Procedure: BRONCHIAL WASHINGS;  Surgeon: Rigoberto Noel, MD;   Location: [facility];  Service: Cardiopulmonary;;  . COLONOSCOPY  02/16/2012   Procedure: COLONOSCOPY;  Surgeon: Daneil Dolin, MD;  [facility];  Service: Endoscopy;  Laterality: N/A;  1:30  . COLONOSCOPY N/A 03/26/2017   Procedure: COLONOSCOPY;  Surgeon: Daneil Dolin, MD;  [facility];  Service: Endoscopy;  Laterality: N/A;  930  . IR IMAGING GUIDED PORT INSERTION  12/19/2019  . LUNG BIOPSY  04/25/2020   Procedure: LUNG BIOPSY;  Surgeon: Rigoberto Noel, MD;  Location[facility] ENDOSCOPY;  Service: Cardiopulmonary;;  . MEDIASTINOSCOPY N/A 05/13/2013   Procedure: MEDIASTINOSCOPY;  Surgeon: Melrose Nakayama, MD;  Location: [facility];  Service: Thoracic;  Laterality: N/A;  . PARTIAL HYSTERECTOMY    . VAGINAL HYSTERECTOMY     partial hyst   . VIDEO BRONCHOSCOPY N/A 04/25/2020   Procedure: VIDEO BRONCHOSCOPY WITH FLUORO;  Surgeon: Rigoberto Noel, MD;  Location: [place];  Service: Cardiopulmonary;  Laterality: N/A;  . VIDEO BRONCHOSCOPY WITH ENDOBRONCHIAL ULTRASOUND N/A 05/13/2013   Procedure: VIDEO BRONCHOSCOPY WITH ENDOBRONCHIAL ULTRASOUND;  Surgeon: Melrose Nakayama, MD;  Location: [facility];  Service: Thoracic;  Laterality: N/A;    There were no vitals filed for this visit.   Subjective Assessment - 12/25/20 1100    Subjective  PT states that her shoulder is good and she is able to lie on it now.    Currently in Pain? No/denies              [facility] PT Assessment - 12/25/20 0001      Assessment   Medical Diagnosis LBP secondary to pathological fracture at L5     Referring Provider (PT)  Felisa Bonier      Observation/Other Assessments   Focus on Therapeutic Outcomes (FOTO)  foto would not pull up for last visit; prior capture = 83% function    initial 46% --> predicted 63% function     AROM   Right Shoulder Flexion 170 Degrees   was 165   Right Shoulder ABduction 170 Degrees   was 155   Right Shoulder Internal Rotation 80 Degrees   reaches to T12 SP     Right Shoulder External Rotation 80 Degrees   reaches to T3 SP    Left Shoulder Flexion 170 Degrees   95   Left Shoulder ABduction 170 Degrees   was 85   Left Shoulder Internal Rotation 80 Degrees   reaches to left greater trochanter- pain    Left Shoulder External Rotation 80 Degrees   reaches to left superior border scapula - pain      PROM   Left Shoulder Internal Rotation --   Left Shoulder External Rotation --     Strength   Right Shoulder Flexion 4+/5    Right Shoulder ABduction 5/5    Right Shoulder Internal Rotation 5/5    Right Shoulder External Rotation 4+/5   was 4-/5   Left Shoulder Flexion 4+/5   was 3+/5   Left Shoulder ABduction 4+/5    Left Shoulder Internal Rotation 4+/5    Left Shoulder External Rotation 4+/5   was 4/5   Right Hip Flexion 5/5    Left Hip Flexion 5/5    Right Knee Flexion 5/5    Right Knee Extension 5/5    Left Knee Flexion 5/5    Left Knee Extension 5/5      Palpation   Palpation comment none                  PT Short Term Goals - 12/25/20 1110      PT SHORT TERM GOAL #1   Title Patient will be able to roll over in bed from supine to prone without pain or difficulties to demonstrate improved transitional mobility    Baseline able to perform with no pain or difficulty    Time 4    Period Weeks    Status Achieved    Target Date 11/06/20      PT SHORT TERM GOAL #2   Title Patient will report at least 25% improvement in overall symptoms and/or function to demonstrate improved functional mobility    Baseline 55-60% better    Time 4    Period Weeks    Status Achieved    Target Date 11/06/20      PT SHORT TERM GOAL #3   Title Patient will be independent in self management strategies to improve quality of life and functional outcomes.    Time 4    Period Weeks    Status Achieved    Target Date 11/06/20             PT Long Term Goals - 12/25/20 1110      PT LONG TERM GOAL #1   Title Patient will  be able to transition  from sit to stand without use of upper extremities and without difficulty to demonstrate improved transitional mobility    Time 8    Period Weeks    Status Achieved      PT LONG TERM GOAL #2   Title Patient will improve on FOTO score to meet predicted outcomes to demonstrate improved functional mobility.    Time 8    Period Weeks    Status Achieved      PT LONG TERM GOAL #3   Title Patient will report at least 50% improvement in overall symptoms and/or function to demonstrate improved functional mobility    Baseline 55-60% better    Time 8    Period Weeks    Status Achieved      PT LONG TERM GOAL #4   Title Patient will report at least 75% improvement in overall symptoms and/or function to demonstrate improved functional mobility    Baseline in regards to her back    Time 5    Period Weeks    Status Achieved      PT LONG TERM GOAL #5   Title Patient will be able hold laundry basket with both hands and ascend stairs to improve ability to go up stairs at home from laundry area    Baseline currently places basket on stairs and takes stairs one at a time    Time 6    Period Weeks    Status Achieved      PT LONG TERM GOAL #6   Title Patient will be able to grab soup can (light cane) from shelf overhead with left arm at least 5x time to demonstrate improved function in the kitchen.    Time 6    Period Weeks    Status Achieved      PT LONG TERM GOAL #7   Title Patient will be able reach to L4 SP with left arm to improve ability to wash back and pull up pants.    Time 6    Period Weeks    Status Achieved                 Plan - 12/25/20 1116    Clinical Impression Statement Ms. Sieg was reassessed as she states that she has no more pain and is able to complete all of her activites of daily living.  All goals have been met, pt has normal ROM and strength in B shoulders and is no longer in need of skilled PT at this time.    Personal Factors and Comorbidities Comorbidity  1;Comorbidity 2;Comorbidity 3+    Comorbidities lymphoma, hx of chemo, DB    Examination-Activity Limitations Bed Mobility;Bend;Squat;Stairs;Stand;Transfers;Locomotion Level;Lift;Sit    Examination-Participation Restrictions Cleaning;Community Activity;Meal Prep;Laundry;Shop    Stability/Clinical Decision Making Evolving/Moderate complexity    Rehab Potential Good    PT Frequency --   1-2x/week for total of 8 visits over 6 week certification   PT Duration 6 weeks    PT Treatment/Interventions ADLs/Self Care Home Management;Aquatic Therapy;Cryotherapy;Moist Heat;Traction;Balance training;Therapeutic exercise;Therapeutic activities;Functional mobility training;Stair training;Gait training;Neuromuscular re-education;Patient/family education;Manual techniques;Joint Manipulations    PT Next Visit Plan Discharge.    PT Home Exercise Plan prone knee bends, bed rolling; 10/28 SLR, bridges, TRA activation, hamstring curls, shoulder flexion with dowel; 11/6 shoulder extension with dowel, clamshell; 11/22 chair pose, lateral stepping 12/l1:  GTB and postural 3/palloff; 1/4 chops/lifts with theraband           Patient will benefit from skilled  therapeutic intervention in order to improve the following deficits and impairments:  Decreased endurance,Pain,Decreased strength,Decreased activity tolerance,Decreased balance,Decreased mobility,Difficulty walking,Decreased range of motion  Visit Diagnosis: Chronic left shoulder pain  Decreased range of motion of left shoulder  Muscle weakness (generalized)     Problem List Patient Active Problem List   Diagnosis Date Noted  . Acute right-sided low back pain   . S/P bronchoscopy   . FUO (fever of unknown origin) 04/26/2020  . Lung nodules   . Sepsis due to undetermined organism ([place]) 04/22/2020  . Lobar pneumonia ([place]) 04/22/2020  . 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 Sepsis due to pneumonia ([place])  04/21/2020  . Type 2 diabetes mellitus with hyperglycemia, without long-term current use of insulin ([place]) 02/20/2020  . Essential hypertension, benign 02/20/2020  . Mixed hyperlipidemia 02/20/2020  . Port-A-Cath in place 01/12/2020  . Trichomonal infection 02/17/2019  . PTTD (posterior tibial tendon dysfunction) 08/21/2015  . Transaminitis 05/31/2014  . Inguinal adenopathy 10/20/2013  . Breast mass, left 10/20/2013  . Neuropathy due to chemotherapeutic drug ([place]) 09/29/2013  . Tumor lysis syndrome 08/21/2013  . Hyperglycemia without ketosis 08/21/2013  . Acute renal failure ([place]) 08/21/2013  . Acute encephalopathy 08/21/2013  . Hyperkalemia 08/21/2013  . Lymphoma ([place]) 05/19/2013  . Shortness of breath 05/06/2013  . Atrial fibrillation with RVR ([place]) 05/06/2013  . Bilateral pneumonia 05/06/2013  . Acute renal insufficiency 05/06/2013  . Normocytic anemia 05/06/2013  . GERD (gastroesophageal reflux disease) 02/02/2012    Rayetta Humphrey, PT CLT [phone] 12/25/2020, 11:18 AM  [place], [place], [address] Phone: [phone]   Fax:  [phone]  Name: TREVIA NOP MRN: [id number] Date of Birth: 1961/03/26

## 2020-12-26 NOTE — Telephone Encounter (Signed)
Called re missed appointment.  Pt running late and had just arrived.  Rayetta Humphrey, [place] CLT [phone]

## 2020-12-28 ENCOUNTER — Encounter (HOSPITAL_COMMUNITY): Payer: Self-pay

## 2021-01-24 DIAGNOSIS — E119 Type 2 diabetes mellitus without complications: Secondary | ICD-10-CM | POA: Diagnosis not present

## 2021-01-24 DIAGNOSIS — I10 Essential (primary) hypertension: Secondary | ICD-10-CM | POA: Diagnosis not present

## 2021-02-18 DIAGNOSIS — C8460 Anaplastic large cell lymphoma, ALK-positive, unspecified site: Secondary | ICD-10-CM | POA: Diagnosis not present

## 2021-02-21 DIAGNOSIS — I10 Essential (primary) hypertension: Secondary | ICD-10-CM | POA: Diagnosis not present

## 2021-02-21 DIAGNOSIS — E119 Type 2 diabetes mellitus without complications: Secondary | ICD-10-CM | POA: Diagnosis not present

## 2021-03-24 DIAGNOSIS — E119 Type 2 diabetes mellitus without complications: Secondary | ICD-10-CM | POA: Diagnosis not present

## 2021-03-24 DIAGNOSIS — I10 Essential (primary) hypertension: Secondary | ICD-10-CM | POA: Diagnosis not present

## 2021-04-16 DIAGNOSIS — U071 COVID-19: Secondary | ICD-10-CM | POA: Diagnosis not present

## 2021-04-23 DIAGNOSIS — I10 Essential (primary) hypertension: Secondary | ICD-10-CM | POA: Diagnosis not present

## 2021-04-23 DIAGNOSIS — E119 Type 2 diabetes mellitus without complications: Secondary | ICD-10-CM | POA: Diagnosis not present

## 2021-05-15 DIAGNOSIS — H33011 Retinal detachment with single break, right eye: Secondary | ICD-10-CM | POA: Diagnosis not present

## 2021-05-15 DIAGNOSIS — H40013 Open angle with borderline findings, low risk, bilateral: Secondary | ICD-10-CM | POA: Diagnosis not present

## 2021-05-15 DIAGNOSIS — E119 Type 2 diabetes mellitus without complications: Secondary | ICD-10-CM | POA: Diagnosis not present

## 2021-05-15 DIAGNOSIS — H2513 Age-related nuclear cataract, bilateral: Secondary | ICD-10-CM | POA: Diagnosis not present

## 2021-05-15 LAB — HM DIABETES EYE EXAM

## 2021-05-20 DIAGNOSIS — C8460 Anaplastic large cell lymphoma, ALK-positive, unspecified site: Secondary | ICD-10-CM | POA: Diagnosis not present

## 2021-05-20 DIAGNOSIS — C8468 Anaplastic large cell lymphoma, ALK-positive, lymph nodes of multiple sites: Secondary | ICD-10-CM | POA: Diagnosis not present

## 2021-05-24 DIAGNOSIS — I10 Essential (primary) hypertension: Secondary | ICD-10-CM | POA: Diagnosis not present

## 2021-05-24 DIAGNOSIS — E119 Type 2 diabetes mellitus without complications: Secondary | ICD-10-CM | POA: Diagnosis not present

## 2021-05-27 DIAGNOSIS — C8460 Anaplastic large cell lymphoma, ALK-positive, unspecified site: Secondary | ICD-10-CM | POA: Diagnosis not present

## 2021-05-31 ENCOUNTER — Ambulatory Visit (INDEPENDENT_AMBULATORY_CARE_PROVIDER_SITE_OTHER): Payer: Medicare Other | Admitting: "Endocrinology

## 2021-05-31 ENCOUNTER — Other Ambulatory Visit: Payer: Self-pay

## 2021-05-31 ENCOUNTER — Encounter: Payer: Self-pay | Admitting: "Endocrinology

## 2021-05-31 VITALS — BP 113/76 | HR 101 | Ht 68.0 in | Wt 246.4 lb

## 2021-05-31 DIAGNOSIS — E1165 Type 2 diabetes mellitus with hyperglycemia: Secondary | ICD-10-CM | POA: Diagnosis not present

## 2021-05-31 DIAGNOSIS — I10 Essential (primary) hypertension: Secondary | ICD-10-CM

## 2021-05-31 DIAGNOSIS — E782 Mixed hyperlipidemia: Secondary | ICD-10-CM | POA: Diagnosis not present

## 2021-05-31 LAB — POCT GLYCOSYLATED HEMOGLOBIN (HGB A1C): HbA1c, POC (controlled diabetic range): 6.1 % (ref 0.0–7.0)

## 2021-05-31 NOTE — Progress Notes (Signed)
05/31/2021, 3:40 PM  Endocrinology follow-up note   Subjective:    Patient ID: Megan Mcknight, female    DOB: 11-20-1961.  Megan Mcknight is being seen in follow-up after she was seen in consultation for management of currently uncontrolled symptomatic diabetes requested by  Rosita Fire, MD.   Past Medical History:  Diagnosis Date   Acute bronchitis    Anaplastic large cell lymphoma 2014   remission since 2015   Asthma    Cancer ([place])    lung and kidney   Colon polyps 02/2013   Per colonoscopy   Coma ([place])    Diabetes mellitus without complication ([place])    Diverticulosis 02/2013   Per colonoscopy   Dysrhythmia    Former light tobacco smoker    GERD (gastroesophageal reflux disease)    H/O stem cell transplant ([place]) 2015   Hyperlipidemia    Nerve pain    Left leg   Non Hodgkin's lymphoma ([place])    Seasonal allergies     Past Surgical History:  Procedure Laterality Date   bones removed from Baby toes     BRONCHIAL BRUSHINGS  04/25/2020   Procedure: BRONCHIAL BRUSHINGS;  Surgeon: Rigoberto Noel, MD;  Location: [facility];  Service: Cardiopulmonary;;   BRONCHIAL WASHINGS  04/25/2020   Procedure: BRONCHIAL WASHINGS;  Surgeon: Rigoberto Noel, MD;  Location: [facility];  Service: Cardiopulmonary;;   COLONOSCOPY  02/16/2012   Procedure: COLONOSCOPY;  Surgeon: Daneil Dolin, MD;  [facility];  Service: Endoscopy;  Laterality: N/A;  1:30   COLONOSCOPY N/A 03/26/2017   Procedure: COLONOSCOPY;  Surgeon: Daneil Dolin, MD;  [facility];  Service: Endoscopy;  Laterality: N/A;  930   IR IMAGING GUIDED PORT INSERTION  12/19/2019   LUNG BIOPSY  04/25/2020   Procedure: LUNG BIOPSY;  Surgeon: Rigoberto Noel, MD;  Location: [facility] END[facility]COPY;  Service: Cardiopulmonary;;   MEDIASTINOSCOPY N/A 05/13/2013   Procedure: MEDIASTINOSCOPY;  Surgeon: Melrose Nakayama, MD;  Location: [facility];  Service: Thoracic;   Laterality: N/A;   PARTIAL HYSTERECTOMY     VAGINAL HYSTERECTOMY     partial hyst    VIDEO BRONCHOSCOPY N/A 04/25/2020   Procedure: VIDEO BRONCHOSCOPY WITH FLUORO;  Surgeon: Rigoberto Noel, MD;  Location: [facility];  Service: Cardiopulmonary;  Laterality: N/A;   VIDEO BRONCHOSCOPY WITH ENDOBRONCHIAL ULTRASOUND N/A 05/13/2013   Procedure: VIDEO BRONCHOSCOPY WITH ENDOBRONCHIAL ULTRASOUND;  Surgeon: Melrose Nakayama, MD;  Location: [facility];  Service: Thoracic;  Laterality: N/A;    Social History   Socioeconomic History   Marital status: Divorced    Spouse name: Not on file   Number of children: 3   Years of education: Not on file   Highest education level: Not on file  Occupational History   Occupation: Merchant navy officer     Employer: [employer]  Tobacco Use   Smoking status: Former    Packs/day: 0.50    Pack years: 0.00    Types: Cigarettes    Quit date: 04/19/2013    Years since quitting: 8.1   Smokeless tobacco: Never  Vaping Use   Vaping Use: Never used  Substance and Sexual Activity   Alcohol use: No   Drug use: No  Sexual activity: Yes    Birth control/protection: Surgical  Other Topics Concern   Not on file  Social History Narrative   Not on file   Social Determinants of Health   Financial Resource Strain: Not on file  Food Insecurity: Not on file  Transportation Needs: Not on file  Physical Activity: Not on file  Stress: Not on file  Social Connections: Not on file    Family History  Problem Relation Age of Onset   Colon polyps Mother    Hypertension Mother    Heart disease Mother    Hyperlipidemia Mother    Cancer Father        prostate   Hypertension Father    Hyperlipidemia Father    Heart defect Other        family history    Asthma Other        family history     Outpatient Encounter Medications as of 05/31/2021  Medication Sig   ACCU-CHEK GUIDE test strip USE AS INSTRUCTED   albuterol (PROVENTIL HFA;VENTOLIN HFA) 108 (90 Base) MCG/ACT  inhaler Inhale 2 puffs into the lungs every 4 (four) hours as needed for wheezing or shortness of breath.   blood glucose meter kit and supplies KIT 1 each by Does not apply route 4 (four) times daily. Dispense based on patient and insurance preference. Use up to four times daily as directed. (FOR ICD-9 250.00, 250.01). (Patient not taking: Reported on 04/21/2020)   Blood Glucose Monitoring Suppl (ACCU-CHEK GUIDE) w/Device KIT 1 Piece by Does not apply route as directed. (Patient not taking: Reported on 04/21/2020)   latanoprost (XALATAN) 0.005 % ophthalmic solution 1 drop at bedtime.   metFORMIN (GLUCOPHAGE) 500 MG tablet Take 500 mg by mouth daily with breakfast.   metoprolol tartrate (LOPRESSOR) 25 MG tablet Take 25 mg by mouth 2 (two) times daily.    montelukast (SINGULAIR) 10 MG tablet Take 10 mg by mouth at bedtime.   rosuvastatin (CRESTOR) 10 MG tablet Take 10 mg by mouth daily.   No facility-administered encounter medications on file as of 05/31/2021.    ALLERGIES: Allergies  Allergen Reactions   Iodine Anaphylaxis    Told to avoid due to shellfish allergy. Told to avoid due to shellfish allergy.   Shellfish-Derived Products Anaphylaxis and Swelling    Eyes swelling, scratchy throat, bumps on lips.    VACCINATION STATUS: Immunization History  Administered Date(s) Administered   DTaP / Hep B / IPV 10/04/2014   HiB (PRP-T) 10/04/2014   Influenza,inj,Quad PF,6+ Mos 11/04/2013, 10/04/2014   Influenza-Unspecified 09/14/2018   Moderna Sars-Covid-2 Vaccination 03/08/2020, 04/10/2020   Pneumococcal Conjugate-13 10/04/2014    Diabetes She presents for her follow-up diabetic visit. She has type 2 diabetes mellitus. Onset time: She was diagnosed at approximate age of 20 years. Her disease course has been stable. There are no hypoglycemic associated symptoms. Pertinent negatives for hypoglycemia include no confusion, headaches, pallor or seizures. Pertinent negatives for diabetes include no  chest pain, no fatigue, no polydipsia, no polyphagia and no polyuria. There are no hypoglycemic complications. Symptoms are stable. There are no diabetic complications. Risk factors for coronary artery disease include diabetes mellitus, dyslipidemia, family history, obesity, hypertension, tobacco exposure, sedentary lifestyle and post-menopausal. Current diabetic treatment includes insulin injections (She is currently on Lantus 60 units nightly, Metformin 1000 mg p.o. twice daily.). Her weight is increasing steadily. She is following a generally unhealthy diet. When asked about meal planning, she reported none. She has not had a previous  visit with a dietitian. She rarely participates in exercise. Her home blood glucose trend is decreasing steadily. (She presents with controlled diabetes with point-of-care A1c was 6.1%, overall improving from 14%.  She is only on metformin 500 mg p.o. once a day.     ) An ACE inhibitor/angiotensin II receptor blocker is being taken. Eye exam is current.  Hyperlipidemia This is a chronic problem. The current episode started more than 1 year ago. Exacerbating diseases include diabetes. Pertinent negatives include no chest pain, myalgias or shortness of breath. Current antihyperlipidemic treatment includes statins. Risk factors for coronary artery disease include dyslipidemia, diabetes mellitus, hypertension, obesity, a sedentary lifestyle, post-menopausal and family history.  Hypertension This is a chronic problem. The current episode started more than 1 year ago. Pertinent negatives include no chest pain, headaches, palpitations or shortness of breath. Risk factors for coronary artery disease include diabetes mellitus, dyslipidemia, family history, obesity, post-menopausal state, smoking/tobacco exposure and sedentary lifestyle. Past treatments include angiotensin blockers.   Review of Systems  Constitutional:  Negative for chills, fatigue, fever and unexpected weight  change.  HENT:  Negative for trouble swallowing and voice change.   Eyes:  Negative for visual disturbance.  Respiratory:  Negative for cough, shortness of breath and wheezing.   Cardiovascular:  Negative for chest pain, palpitations and leg swelling.  Gastrointestinal:  Negative for diarrhea, nausea and vomiting.  Endocrine: Negative for cold intolerance, heat intolerance, polydipsia, polyphagia and polyuria.  Musculoskeletal:  Negative for arthralgias and myalgias.  Skin:  Negative for color change, pallor, rash and wound.  Neurological:  Negative for seizures and headaches.  Psychiatric/Behavioral:  Negative for confusion and suicidal ideas.    Objective:    Vitals with BMI 05/31/2021 11/30/2020 08/29/2020  Height 5' 8"  5' 8"  5' 8"   Weight 246 lbs 6 oz 242 lbs 229 lbs 13 oz  BMI 37.47 78.6 75.44  Systolic 920 100 712  Diastolic 76 72 74  Pulse 197 68 72    BP 113/76   Pulse (!) 101   Ht 5' 8"  (1.727 m)   Wt 246 lb 6.4 oz (111.8 kg)   BMI 37.46 kg/m   Wt Readings from Last 3 Encounters:  05/31/21 246 lb 6.4 oz (111.8 kg)  11/30/20 242 lb (109.8 kg)  08/29/20 229 lb 12.8 oz (104.2 kg)     Physical Exam Constitutional:      Appearance: She is well-developed.  HENT:     Head: Normocephalic and atraumatic.  Neck:     Thyroid: No thyromegaly.     Trachea: No tracheal deviation.  Cardiovascular:     Rate and Rhythm: Normal rate and regular rhythm.  Pulmonary:     Effort: Pulmonary effort is normal.  Abdominal:     Palpations: Abdomen is soft.     Tenderness: There is no abdominal tenderness. There is no guarding.  Musculoskeletal:        General: Normal range of motion.     Cervical back: Normal range of motion and neck supple.  Skin:    General: Skin is warm and dry.     Coloration: Skin is not pale.     Findings: No erythema or rash.  Neurological:     Mental Status: She is alert and oriented to person, place, and time.     Cranial Nerves: No cranial nerve  deficit.     Coordination: Coordination normal.     Deep Tendon Reflexes: Reflexes are normal and symmetric.  Psychiatric:  Judgment: Judgment normal.     CMP ( most recent) CMP     Component Value Date/Time   NA 144 11/05/2020 0927   NA 144 09/04/2017 0916   K 4.3 11/05/2020 0927   K 4.1 09/04/2017 0916   CL 105 11/05/2020 0927   CO2 23 11/05/2020 0927   CO2 26 09/04/2017 0916   GLUCOSE 102 (H) 11/05/2020 0927   GLUCOSE 280 (H) 05/04/2020 0414   GLUCOSE 197 (H) 09/04/2017 0916   BUN 14 11/05/2020 0927   BUN 15.6 09/04/2017 0916   CREATININE 1.07 (H) 11/05/2020 0927   CREATININE 0.84 04/05/2020 0900   CREATININE 1.0 09/04/2017 0916   CALCIUM 10.1 11/05/2020 0927   CALCIUM 9.5 09/04/2017 0916   PROT 7.2 11/05/2020 0927   PROT 6.8 09/04/2017 0916   ALBUMIN 4.5 11/05/2020 0927   ALBUMIN 3.8 09/04/2017 0916   AST 19 11/05/2020 0927   AST 18 04/05/2020 0900   AST 19 09/04/2017 0916   ALT 17 11/05/2020 0927   ALT 14 04/05/2020 0900   ALT 18 09/04/2017 0916   ALKPHOS 86 11/05/2020 0927   ALKPHOS 49 09/04/2017 0916   BILITOT 0.4 11/05/2020 0927   BILITOT 0.3 04/05/2020 0900   BILITOT 0.49 09/04/2017 0916   GFRNONAA 57 (L) 11/05/2020 0927   GFRNONAA >60 04/05/2020 0900   GFRAA 66 11/05/2020 0927   GFRAA >60 04/05/2020 0900    Diabetic Labs (most recent): Lab Results  Component Value Date   HGBA1C 6.1 05/31/2021   HGBA1C 5.8 11/30/2020   HGBA1C 7.4 (H) 04/22/2020    Lab Results  Component Value Date   TSH 3.770 11/05/2020   TSH 2.859 05/06/2013   FREET4 0.79 (L) 11/05/2020   FREET4 0.93 05/06/2013     Assessment & Plan:   1. Type 2 diabetes mellitus with hyperglycemia, without long-term current use of insulin (HCC)  - Ilamae L Lile has currently uncontrolled symptomatic type 2 DM since  61 years of age.  She presents with controlled diabetes with point-of-care A1c was 6.1%, overall improving from 14%.  She is only on metformin 500 mg p.o. once a  day.    -- Recent labs reviewed.  Her diabetes is recently exacerbated by concurrent use of steroids during treatment with chemo for lymphoma. - I had a long discussion with her about the progressive nature of diabetes and the pathology behind its complications. -her diabetes is complicated by obesity/sedentary life and she remains at a high risk for more acute and chronic complications which include CAD, CVA, CKD, retinopathy, and neuropathy. These are all discussed in detail with her.  - I have counseled her on diet  and weight management  by adopting a carbohydrate restricted/protein rich diet. Patient is encouraged to switch to  unprocessed or minimally processed     complex starch and increased protein intake (animal or plant source), fruits, and vegetables. -  she is advised to stick to a routine mealtimes to eat 3 meals  a day and avoid unnecessary snacks ( to snack only to correct hypoglycemia).   - she acknowledges that there is a room for improvement in her food and drink choices. - Suggestion is made for her to avoid simple carbohydrates  from her diet including Cakes, Sweet Desserts, Ice Cream, Soda (diet and regular), Sweet Tea, Candies, Chips, Cookies, Store Bought Juices, Alcohol in Excess of  1-2 drinks a day, Artificial Sweeteners,  Coffee Creamer, and "Sugar-free" Products, Lemonade. This will help patient to have more  stable blood glucose profile and potentially avoid unintended weight gain.   - she will be scheduled with Jearld Fenton, RDN, CDE for diabetes education.  - I have approached her with the following individualized plan to manage  her diabetes and patient agrees:   -Based on her presentation with controlled glycemic profile and point-of-care A1c of 6.1%, she will not need any additional treatment.  However, she is advised to continue metformin 500 mg p.o. daily after breakfast.  She does not have to monitor blood glucose.    - she will be considered for incretin  therapy as appropriate next visit.  - Specific targets for  A1c;  LDL, HDL,  and Triglycerides were discussed with the patient.  2) Blood Pressure /Hypertension:  Her blood pressure is controlled to target. -  she is advised to continue her current medications including losartan 25 mg p.o. daily with breakfast . 3) Lipids/Hyperlipidemia: She does not have recent lipid panel to review.  She is advised to continue Crestor 10 mg p.o. daily at bedtime.     Side effects and precautions discussed with her.  4)  Weight/Diet:  Body mass index is 37.46 kg/m.  -   clearly complicating her diabetes care.   she is  a candidate for weight loss. I discussed with her the fact that loss of 5 - 10% of her  current body weight will have the most impact on her diabetes management.  Exercise, and detailed carbohydrates information provided  -  detailed on discharge instructions.  5) Chronic Care/Health Maintenance:  -she  is on ACEI/ARB and Statin medications and  is encouraged to initiate and continue to follow up with Ophthalmology, Dentist,  Podiatrist at least yearly or according to recommendations, and advised to   stay away from smoking. I have recommended yearly flu vaccine and pneumonia vaccine at least every 5 years; moderate intensity exercise for up to 150 minutes weekly; and  sleep for at least 7 hours a day.  - she is  advised to maintain close follow up with Rosita Fire, MD for primary care needs, as well as her other providers for optimal and coordinated care.  I spent 30 minutes in the care of the patient today including review of labs from [facility], Lipids, Thyroid Function, Hematology (current and previous including abstractions from other facilities); face-to-face time discussing  her blood glucose readings/logs, discussing hypoglycemia and hyperglycemia episodes and symptoms, medications doses, her options of short and long term treatment based on the latest standards of care / guidelines;   discussion about incorporating lifestyle medicine;  and documenting the encounter.    Please refer to Patient Instructions for Blood Glucose Monitoring and Insulin/Medications Dosing Guide"  in media tab for additional information. Please  also refer to " Patient Self Inventory" in the Media  tab for reviewed elements of pertinent patient history.  Megan Mcknight participated in the discussions, expressed understanding, and voiced agreement with the above plans.  All questions were answered to her satisfaction. she is encouraged to contact clinic should she have any questions or concerns prior to her return visit.   Follow up plan: - Return in about 6 months (around 11/30/2021) for F/U with Pre-visit Labs, A1c -NV.  Glade Lloyd, MD [facility] [address] [place], [place] [address] Phone: [phone]  Fax: [phone]    05/31/2021, 3:40 PM  This note was partially dictated with voice recognition software. Similar sounding words can be transcribed inadequately or  may not  be corrected upon review.

## 2021-05-31 NOTE — Patient Instructions (Signed)

## 2021-06-18 DIAGNOSIS — I10 Essential (primary) hypertension: Secondary | ICD-10-CM | POA: Diagnosis not present

## 2021-06-18 DIAGNOSIS — C8589 Other specified types of non-Hodgkin lymphoma, extranodal and solid organ sites: Secondary | ICD-10-CM | POA: Diagnosis not present

## 2021-06-18 DIAGNOSIS — G629 Polyneuropathy, unspecified: Secondary | ICD-10-CM | POA: Diagnosis not present

## 2021-06-18 DIAGNOSIS — E1165 Type 2 diabetes mellitus with hyperglycemia: Secondary | ICD-10-CM | POA: Diagnosis not present

## 2021-08-26 DIAGNOSIS — Z8572 Personal history of non-Hodgkin lymphomas: Secondary | ICD-10-CM | POA: Diagnosis not present

## 2021-08-26 DIAGNOSIS — Z9221 Personal history of antineoplastic chemotherapy: Secondary | ICD-10-CM | POA: Diagnosis not present

## 2021-08-26 DIAGNOSIS — Z08 Encounter for follow-up examination after completed treatment for malignant neoplasm: Secondary | ICD-10-CM | POA: Diagnosis not present

## 2021-08-26 DIAGNOSIS — D61810 Antineoplastic chemotherapy induced pancytopenia: Secondary | ICD-10-CM | POA: Diagnosis not present

## 2021-08-26 DIAGNOSIS — C8460 Anaplastic large cell lymphoma, ALK-positive, unspecified site: Secondary | ICD-10-CM | POA: Diagnosis not present

## 2021-08-26 DIAGNOSIS — Z9484 Stem cells transplant status: Secondary | ICD-10-CM | POA: Diagnosis not present

## 2021-08-29 ENCOUNTER — Emergency Department (HOSPITAL_COMMUNITY): Payer: Managed Care, Other (non HMO)

## 2021-08-29 ENCOUNTER — Encounter: Payer: Self-pay | Admitting: Oncology

## 2021-08-29 ENCOUNTER — Other Ambulatory Visit: Payer: Self-pay

## 2021-08-29 ENCOUNTER — Emergency Department (HOSPITAL_COMMUNITY)
Admission: EM | Admit: 2021-08-29 | Discharge: 2021-08-29 | Disposition: A | Discharge status: Home / Self Care | Payer: Managed Care, Other (non HMO) | Attending: Emergency Medicine | Admitting: Emergency Medicine

## 2021-08-29 ENCOUNTER — Encounter (HOSPITAL_COMMUNITY): Payer: Self-pay | Admitting: Emergency Medicine

## 2021-08-29 DIAGNOSIS — R059 Cough, unspecified: Secondary | ICD-10-CM | POA: Diagnosis not present

## 2021-08-29 DIAGNOSIS — I10 Essential (primary) hypertension: Secondary | ICD-10-CM | POA: Insufficient documentation

## 2021-08-29 DIAGNOSIS — Z87891 Personal history of nicotine dependence: Secondary | ICD-10-CM | POA: Insufficient documentation

## 2021-08-29 DIAGNOSIS — Z20822 Contact with and (suspected) exposure to covid-19: Secondary | ICD-10-CM | POA: Insufficient documentation

## 2021-08-29 DIAGNOSIS — E1169 Type 2 diabetes mellitus with other specified complication: Secondary | ICD-10-CM | POA: Diagnosis not present

## 2021-08-29 DIAGNOSIS — Z85118 Personal history of other malignant neoplasm of bronchus and lung: Secondary | ICD-10-CM | POA: Insufficient documentation

## 2021-08-29 DIAGNOSIS — J4541 Moderate persistent asthma with (acute) exacerbation: Secondary | ICD-10-CM | POA: Diagnosis not present

## 2021-08-29 DIAGNOSIS — J45909 Unspecified asthma, uncomplicated: Secondary | ICD-10-CM | POA: Diagnosis not present

## 2021-08-29 DIAGNOSIS — R0602 Shortness of breath: Secondary | ICD-10-CM | POA: Diagnosis not present

## 2021-08-29 DIAGNOSIS — Z79899 Other long term (current) drug therapy: Secondary | ICD-10-CM | POA: Diagnosis not present

## 2021-08-29 DIAGNOSIS — Z7984 Long term (current) use of oral hypoglycemic drugs: Secondary | ICD-10-CM | POA: Diagnosis not present

## 2021-08-29 LAB — CBC WITH DIFFERENTIAL/PLATELET
Abs Immature Granulocytes: 0.01 10*3/uL (ref 0.00–0.07)
Basophils Absolute: 0 10*3/uL (ref 0.0–0.1)
Basophils Relative: 0 %
Eosinophils Absolute: 0 10*3/uL (ref 0.0–0.5)
Eosinophils Relative: 1 %
HCT: 39.7 % (ref 36.0–46.0)
Hemoglobin: 12.1 g/dL (ref 12.0–15.0)
Immature Granulocytes: 0 %
Lymphocytes Relative: 15 %
Lymphs Abs: 0.8 10*3/uL (ref 0.7–4.0)
MCH: 27.6 pg (ref 26.0–34.0)
MCHC: 30.5 g/dL (ref 30.0–36.0)
MCV: 90.4 fL (ref 80.0–100.0)
Monocytes Absolute: 0.3 10*3/uL (ref 0.1–1.0)
Monocytes Relative: 6 %
Neutro Abs: 4 10*3/uL (ref 1.7–7.7)
Neutrophils Relative %: 78 %
Platelets: 141 10*3/uL — ABNORMAL LOW (ref 150–400)
RBC: 4.39 MIL/uL (ref 3.87–5.11)
RDW: 14.1 % (ref 11.5–15.5)
WBC: 5.1 10*3/uL (ref 4.0–10.5)
nRBC: 0 % (ref 0.0–0.2)

## 2021-08-29 LAB — COMPREHENSIVE METABOLIC PANEL
ALT: 22 U/L (ref 0–44)
AST: 24 U/L (ref 15–41)
Albumin: 4.1 g/dL (ref 3.5–5.0)
Alkaline Phosphatase: 66 U/L (ref 38–126)
Anion gap: 6 (ref 5–15)
BUN: 14 mg/dL (ref 6–20)
CO2: 29 mmol/L (ref 22–32)
Calcium: 9.4 mg/dL (ref 8.9–10.3)
Chloride: 108 mmol/L (ref 98–111)
Creatinine, Ser: 0.86 mg/dL (ref 0.44–1.00)
GFR, Estimated: >60 mL/min (ref >60)
Glucose, Bld: 111 mg/dL — ABNORMAL HIGH (ref 70–99)
Potassium: 4.1 mmol/L (ref 3.5–5.1)
Sodium: 143 mmol/L (ref 135–145)
Total Bilirubin: 0.5 mg/dL (ref 0.3–1.2)
Total Protein: 7.1 g/dL (ref 6.5–8.1)

## 2021-08-29 LAB — RESP PANEL BY RT-PCR (FLU A&B, COVID) ARPGX2
Influenza A by PCR: NEGATIVE
Influenza B by PCR: NEGATIVE
SARS Coronavirus 2 by RT PCR: NEGATIVE

## 2021-08-29 LAB — BRAIN NATRIURETIC PEPTIDE: B Natriuretic Peptide: 51 pg/mL (ref 0.0–100.0)

## 2021-08-29 LAB — TROPONIN I (HIGH SENSITIVITY): Troponin I (High Sensitivity): 4 ng/L (ref <18)

## 2021-08-29 MED ORDER — IPRATROPIUM-ALBUTEROL 0.5-2.5 (3) MG/3ML IN SOLN
3.0000 mL | Freq: Once | RESPIRATORY_TRACT | Status: AC
  Administered 2021-08-29: 3 mL via RESPIRATORY_TRACT

## 2021-08-29 MED ORDER — PREDNISONE 10 MG PO TABS: 20.0000 mg | ORAL_TABLET | Freq: Two times a day (BID) | ORAL | 0 refills | Status: DC

## 2021-08-29 MED ORDER — METHYLPREDNISOLONE SODIUM SUCC 125 MG IJ SOLR
125.0000 mg | Freq: Once | INTRAMUSCULAR | Status: AC
  Administered 2021-08-29: 125 mg via INTRAVENOUS
  Filled 2021-08-29: qty 2

## 2021-08-29 NOTE — ED Notes (Signed)
Pt ready for discharge; waiting on RT for breathing treatment

## 2021-08-29 NOTE — Discharge Instructions (Addendum)
Begin taking prednisone as prescribed.  Continue your breathing treatments every 4 hours as needed for wheezing.  Return to the emergency department if you develop severe chest pain, worsening breathing, or other new and concerning symptoms.

## 2021-08-29 NOTE — ED Triage Notes (Signed)
Pt states she has had 3 asthma attacks with am with cough.

## 2021-08-29 NOTE — ED Provider Notes (Signed)
[facility] EMERGENCY DEPARTMENT Provider Note   CSN: [id number] Arrival date & time: 08/29/21  0505     History Chief Complaint  Patient presents with   Shortness of Megan Mcknight is a 60 y.o. female.  Patient is a 60 year old female with past medical history of diabetes, asthma, non-Hodgkin's lymphoma in remission.  Patient presenting today with complaints of shortness of breath.  She has been having what she describes as "asthma attacks" since yesterday.  She has episodes of wheezing which seemed to improve somewhat when she uses her inhaler.  She does report some cough that is occasionally productive of green sputum.  Patient denies fevers at home, but is febrile with temp of 100.1 here.  She denies sick contacts or exposures.  The history is provided by the patient.  Shortness of Breath Duration:  24 hours Timing:  Intermittent Progression:  Worsening Chronicity:  New Relieved by:  Inhaler Worsened by:  Nothing     Past Medical History:  Diagnosis Date   Acute bronchitis    Anaplastic large cell lymphoma 2014   remission since 2015   Asthma    Cancer ([place])    lung and kidney   Colon polyps 02/2013   Per colonoscopy   Coma ([place])    Diabetes mellitus without complication ([place])    Diverticulosis 02/2013   Per colonoscopy   Dysrhythmia    Former light tobacco smoker    GERD (gastroesophageal reflux disease)    H/O stem cell transplant ([place]) 2015   Hyperlipidemia    Nerve pain    Left leg   Non Hodgkin's lymphoma ([place])    Seasonal allergies     Patient Active Problem List   Diagnosis Date Noted   Acute right-sided low back pain    S/P bronchoscopy    FUO (fever of unknown origin) 04/26/2020   Lung nodules    Sepsis due to undetermined organism ([place]) 04/22/2020   Lobar pneumonia ([place]) 04/22/2020   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[place]) 04/22/2020   Sepsis due to pneumonia ([place]) 04/21/2020   Type 2 diabetes mellitus  with hyperglycemia, without long-term current use of insulin ([place]) 02/20/2020   Essential hypertension, benign 02/20/2020   Mixed hyperlipidemia 02/20/2020   Port-A-Cath in place 01/12/2020   Trichomonal infection 02/17/2019   PTTD (posterior tibial tendon dysfunction) 08/21/2015   Transaminitis 05/31/2014   Inguinal adenopathy 10/20/2013   Breast mass, left 10/20/2013   Neuropathy due to chemotherapeutic drug ([place]) 09/29/2013   Tumor lysis syndrome 08/21/2013   Hyperglycemia without ketosis 08/21/2013   Acute renal failure ([place]) 08/21/2013   Acute encephalopathy 08/21/2013   Hyperkalemia 08/21/2013   Lymphoma ([place]) 05/19/2013   Shortness of breath 05/06/2013   Atrial fibrillation with RVR ([place]) 05/06/2013   Bilateral pneumonia 05/06/2013   Acute renal insufficiency 05/06/2013   Normocytic anemia 05/06/2013   GERD (gastroesophageal reflux disease) 02/02/2012    Past Surgical History:  Procedure Laterality Date   bones removed from Baby toes     BRONCHIAL BRUSHINGS  04/25/2020   Procedure: BRONCHIAL BRUSHINGS;  Surgeon: Rigoberto Noel, MD;  Location: [place];  Service: Cardiopulmonary;;   BRONCHIAL WASHINGS  04/25/2020   Procedure: BRONCHIAL WASHINGS;  Surgeon: Rigoberto Noel, MD;  Location: [place];  Service: Cardiopulmonary;;   COLONOSCOPY  02/16/2012   Procedure: COLONOSCOPY;  Surgeon: Daneil Dolin, MD;  [facility];  Service: Endoscopy;  Laterality: N/A;  1:30  COLONOSCOPY N/A 03/26/2017   Procedure: COLONOSCOPY;  Surgeon: Daneil Dolin, MD;  [facility];  Service: Endoscopy;  Laterality: N/A;  930   IR IMAGING GUIDED PORT INSERTION  12/19/2019   LUNG BIOPSY  04/25/2020   Procedure: LUNG BIOPSY;  Surgeon: Rigoberto Noel, MD;  Location: [facility] ENDOSCOPY;  Service: Cardiopulmonary;;   MEDIASTINOSCOPY N/A 05/13/2013   Procedure: MEDIASTINOSCOPY;  Surgeon: Melrose Nakayama, MD;  Location: [place];  Service: Thoracic;  Laterality: N/A;   PARTIAL  HYSTERECTOMY     VAGINAL HYSTERECTOMY     partial hyst    VIDEO BRONCHOSCOPY N/A 04/25/2020   Procedure: VIDEO BRONCHOSCOPY WITH FLUORO;  Surgeon: Rigoberto Noel, MD;  Location: [place];  Service: Cardiopulmonary;  Laterality: N/A;   VIDEO BRONCHOSCOPY WITH ENDOBRONCHIAL ULTRASOUND N/A 05/13/2013   Procedure: VIDEO BRONCHOSCOPY WITH ENDOBRONCHIAL ULTRASOUND;  Surgeon: Melrose Nakayama, MD;  Location: [facility] OR;  Service: Thoracic;  Laterality: N/A;     OB History     Gravida  3   Para      Term      Preterm      AB      Living  3      SAB      IAB      Ectopic      Multiple      Live Births  3           Family History  Problem Relation Age of Onset   Colon polyps Mother    Hypertension Mother    Heart disease Mother    Hyperlipidemia Mother    Cancer Father        prostate   Hypertension Father    Hyperlipidemia Father    Heart defect Other        family history    Asthma Other        family history     Social History   Tobacco Use   Smoking status: Former    Packs/day: 0.50    Types: Cigarettes    Quit date: 04/19/2013    Years since quitting: 8.3   Smokeless tobacco: Never  Vaping Use   Vaping Use: Never used  Substance Use Topics   Alcohol use: No   Drug use: No    Home Medications Prior to Admission medications   Medication Sig Start Date End Date Taking? Authorizing Provider  ACCU-CHEK GUIDE test strip USE AS INSTRUCTED 05/28/20   Cassandria Anger, MD  albuterol (PROVENTIL HFA;VENTOLIN HFA) 108 (90 Base) MCG/ACT inhaler Inhale 2 puffs into the lungs every 4 (four) hours as needed for wheezing or shortness of breath. 10/27/16   Orpah Greek, MD  blood glucose meter kit and supplies KIT 1 each by Does not apply route 4 (four) times daily. Dispense based on patient and insurance preference. Use up to four times daily as directed. (FOR ICD-9 250.00, 250.01). Patient not taking: Reported on 04/21/2020 02/21/20   Cassandria Anger, MD  Blood Glucose Monitoring Suppl (ACCU-CHEK GUIDE) w/Device KIT 1 Piece by Does not apply route as directed. Patient not taking: Reported on 04/21/2020 02/20/20   Cassandria Anger, MD  latanoprost (XALATAN) 0.005 % ophthalmic solution 1 drop at bedtime. 12/13/19   [provider]  metFORMIN (GLUCOPHAGE) 500 MG tablet Take 500 mg by mouth daily with breakfast.    [provider]  metoprolol tartrate (LOPRESSOR) 25 MG tablet Take 25 mg by mouth 2 (two) times  daily.     [provider]  montelukast (SINGULAIR) 10 MG tablet Take 10 mg by mouth at bedtime.    [provider]  rosuvastatin (CRESTOR) 10 MG tablet Take 10 mg by mouth daily.    [provider]    Allergies    Iodine and Shellfish-derived products  Review of Systems   Review of Systems  Respiratory:  Positive for shortness of breath.   All other systems reviewed and are negative.  Physical Exam Updated Vital Signs BP (!) 149/89   Pulse 89   Temp 100.1 F (37.8 C) (Oral)   Resp 18   Ht 5' 8"  (1.727 m)   Wt 113.9 kg   SpO2 97%   BMI 38.16 kg/m   Physical Exam Vitals and nursing note reviewed.  Constitutional:      General: She is not in acute distress.    Appearance: She is well-developed. She is not diaphoretic.  HENT:     Head: Normocephalic and atraumatic.  Cardiovascular:     Rate and Rhythm: Normal rate and regular rhythm.     Heart sounds: No murmur heard.   No friction rub. No gallop.  Pulmonary:     Effort: Pulmonary effort is normal. No respiratory distress.     Breath sounds: Normal breath sounds. No wheezing.  Abdominal:     General: Bowel sounds are normal. There is no distension.     Palpations: Abdomen is soft.     Tenderness: There is no abdominal tenderness.  Musculoskeletal:        General: Normal range of motion.     Cervical back: Normal range of motion and neck supple.     Right lower leg: No tenderness. No edema.     Left  lower leg: No tenderness. No edema.  Skin:    General: Skin is warm and dry.  Neurological:     General: No focal deficit present.     Mental Status: She is alert and oriented to person, place, and time.    ED Results / Procedures / Treatments   Labs (all labs ordered are listed, but only abnormal results are displayed) Labs Reviewed  RESP PANEL BY RT-PCR (FLU A&B, COVID) [id number]  COMPREHENSIVE METABOLIC PANEL  CBC WITH DIFFERENTIAL/PLATELET  BRAIN NATRIURETIC PEPTIDE  TROPONIN I (HIGH SENSITIVITY)    EKG EKG Interpretation  Date/Time:  Thursday August 29 2021 05:28:46 EDT Ventricular Rate:  93 PR Interval:  165 QRS Duration: 90 QT Interval:  357 QTC Calculation: 444 R Axis:   -38 Text Interpretation: Sinus rhythm Left axis deviation Abnormal R-wave progression, early transition Confirmed by Veryl Speak [id number]) on 08/29/2021 5:53:30 AM  Radiology No results found.  Procedures Procedures   Medications Ordered in ED Medications - No data to display  ED Course  I have reviewed the triage vital signs and the nursing notes.  Pertinent labs & imaging results that were available during my care of the patient were reviewed by me and considered in my medical decision making (see chart for details).    MDM Rules/Calculators/A&P  Patient presenting here with complaints of shortness of breath and wheezing.  She does describe some cough and is borderline febrile upon arrival.  COVID test is negative and remainder of her work-up is unremarkable.  Patient does seem to be feeling better after receiving steroids and a DuoNeb.  Patient to be discharged with prednisone and continued use of her inhaler.  She is to return as needed if she  worsens.  Final Clinical Impression(s) / ED Diagnoses Final diagnoses:  None    Rx / DC Orders ED Discharge Orders     None        Veryl Speak, MD 08/30/21 0005

## 2021-09-17 DIAGNOSIS — J4540 Moderate persistent asthma, uncomplicated: Secondary | ICD-10-CM | POA: Diagnosis not present

## 2021-09-17 DIAGNOSIS — E1142 Type 2 diabetes mellitus with diabetic polyneuropathy: Secondary | ICD-10-CM | POA: Diagnosis not present

## 2021-09-17 DIAGNOSIS — Z23 Encounter for immunization: Secondary | ICD-10-CM | POA: Diagnosis not present

## 2021-09-17 DIAGNOSIS — I10 Essential (primary) hypertension: Secondary | ICD-10-CM | POA: Diagnosis not present

## 2021-09-17 DIAGNOSIS — E785 Hyperlipidemia, unspecified: Secondary | ICD-10-CM | POA: Diagnosis not present

## 2021-09-19 ENCOUNTER — Other Ambulatory Visit (HOSPITAL_COMMUNITY): Payer: Self-pay | Admitting: Internal Medicine

## 2021-09-19 DIAGNOSIS — Z1231 Encounter for screening mammogram for malignant neoplasm of breast: Secondary | ICD-10-CM

## 2021-10-21 ENCOUNTER — Other Ambulatory Visit: Payer: Self-pay

## 2021-10-21 ENCOUNTER — Ambulatory Visit (HOSPITAL_COMMUNITY): Payer: Medicare Other

## 2021-10-21 ENCOUNTER — Ambulatory Visit (HOSPITAL_COMMUNITY)
Admission: RE | Admit: 2021-10-21 | Discharge: 2021-10-21 | Disposition: A | Discharge status: Home / Self Care | Payer: Managed Care, Other (non HMO) | Source: Ambulatory Visit | Attending: Internal Medicine | Admitting: Internal Medicine

## 2021-10-21 DIAGNOSIS — Z1231 Encounter for screening mammogram for malignant neoplasm of breast: Secondary | ICD-10-CM | POA: Diagnosis not present

## 2021-11-19 DIAGNOSIS — C8460 Anaplastic large cell lymphoma, ALK-positive, unspecified site: Secondary | ICD-10-CM | POA: Diagnosis not present

## 2021-11-19 DIAGNOSIS — C8468 Anaplastic large cell lymphoma, ALK-positive, lymph nodes of multiple sites: Secondary | ICD-10-CM | POA: Diagnosis not present

## 2021-11-21 ENCOUNTER — Encounter: Payer: Self-pay | Admitting: Oncology

## 2021-11-22 DIAGNOSIS — E782 Mixed hyperlipidemia: Secondary | ICD-10-CM | POA: Diagnosis not present

## 2021-11-22 DIAGNOSIS — E1165 Type 2 diabetes mellitus with hyperglycemia: Secondary | ICD-10-CM | POA: Diagnosis not present

## 2021-11-23 ENCOUNTER — Encounter: Payer: Self-pay | Admitting: Oncology

## 2021-11-23 LAB — COMPREHENSIVE METABOLIC PANEL
ALT: 18 IU/L (ref 0–32)
AST: 20 IU/L (ref 0–40)
Albumin/Globulin Ratio: 2 (ref 1.2–2.2)
Albumin: 4.7 g/dL (ref 3.8–4.9)
Alkaline Phosphatase: 78 IU/L (ref 44–121)
BUN/Creatinine Ratio: 17 (ref 12–28)
BUN: 16 mg/dL (ref 8–27)
Bilirubin Total: 0.4 mg/dL (ref 0.0–1.2)
CO2: 24 mmol/L (ref 20–29)
Calcium: 9.6 mg/dL (ref 8.7–10.3)
Chloride: 106 mmol/L (ref 96–106)
Creatinine, Ser: 0.94 mg/dL (ref 0.57–1.00)
Globulin, Total: 2.3 g/dL (ref 1.5–4.5)
Glucose: 69 mg/dL — ABNORMAL LOW (ref 70–99)
Potassium: 4.5 mmol/L (ref 3.5–5.2)
Sodium: 145 mmol/L — ABNORMAL HIGH (ref 134–144)
Total Protein: 7 g/dL (ref 6.0–8.5)
eGFR: 69 mL/min/{1.73_m2} (ref >59)

## 2021-11-23 LAB — LIPID PANEL
Chol/HDL Ratio: 2.5 ratio (ref 0.0–4.4)
Cholesterol, Total: 177 mg/dL (ref 100–199)
HDL: 70 mg/dL (ref >39)
LDL Chol Calc (NIH): 87 mg/dL (ref 0–99)
Triglycerides: 111 mg/dL (ref 0–149)
VLDL Cholesterol Cal: 20 mg/dL (ref 5–40)

## 2021-11-23 LAB — TSH: TSH: 3.21 u[IU]/mL (ref 0.450–4.500)

## 2021-11-23 LAB — T4, FREE: Free T4: 0.97 ng/dL (ref 0.82–1.77)

## 2021-11-25 DIAGNOSIS — Z9221 Personal history of antineoplastic chemotherapy: Secondary | ICD-10-CM | POA: Diagnosis not present

## 2021-11-25 DIAGNOSIS — C8590 Non-Hodgkin lymphoma, unspecified, unspecified site: Secondary | ICD-10-CM | POA: Diagnosis not present

## 2021-11-25 DIAGNOSIS — C8460 Anaplastic large cell lymphoma, ALK-positive, unspecified site: Secondary | ICD-10-CM | POA: Diagnosis not present

## 2021-11-28 ENCOUNTER — Other Ambulatory Visit: Payer: Self-pay

## 2021-11-28 ENCOUNTER — Encounter: Payer: Self-pay | Admitting: "Endocrinology

## 2021-11-28 ENCOUNTER — Ambulatory Visit (INDEPENDENT_AMBULATORY_CARE_PROVIDER_SITE_OTHER): Payer: Managed Care, Other (non HMO) | Admitting: "Endocrinology

## 2021-11-28 VITALS — BP 110/68 | HR 72 | Ht 68.0 in | Wt 254.8 lb

## 2021-11-28 DIAGNOSIS — E782 Mixed hyperlipidemia: Secondary | ICD-10-CM

## 2021-11-28 DIAGNOSIS — I10 Essential (primary) hypertension: Secondary | ICD-10-CM

## 2021-11-28 DIAGNOSIS — E1165 Type 2 diabetes mellitus with hyperglycemia: Secondary | ICD-10-CM | POA: Diagnosis not present

## 2021-11-28 LAB — POCT GLYCOSYLATED HEMOGLOBIN (HGB A1C): HbA1c, POC (controlled diabetic range): 6 % (ref 0.0–7.0)

## 2021-11-28 NOTE — Progress Notes (Signed)
11/28/2021, 4:25 PM  Endocrinology follow-up note   Subjective:    Patient ID: Megan Mcknight, female    DOB: 19-Oct-1961.  Megan Mcknight is being seen in follow-up after she was seen in consultation for management of currently uncontrolled symptomatic diabetes requested by  Rosita Fire, MD.   Past Medical History:  Diagnosis Date   Acute bronchitis    Anaplastic large cell lymphoma 2014   remission since 2015   Asthma    Cancer ([place])    lung and kidney   Colon polyps 02/2013   Per colonoscopy   Coma ([place])    Diabetes mellitus without complication ([place])    Diverticulosis 02/2013   Per colonoscopy   Dysrhythmia    Former light tobacco smoker    GERD (gastroesophageal reflux disease)    H/O stem cell transplant ([place]) 2015   Hyperlipidemia    Nerve pain    Left leg   Non Hodgkin's lymphoma ([place])    Seasonal allergies     Past Surgical History:  Procedure Laterality Date   bones removed from Baby toes     BRONCHIAL BRUSHINGS  04/25/2020   Procedure: BRONCHIAL BRUSHINGS;  Surgeon: Rigoberto Noel, MD;  Location: [facility];  Service: Cardiopulmonary;;   BRONCHIAL WASHINGS  04/25/2020   Procedure: BRONCHIAL WASHINGS;  Surgeon: Rigoberto Noel, MD;  Location: [facility];  Service: Cardiopulmonary;;   COLONOSCOPY  02/16/2012   Procedure: COLONOSCOPY;  Surgeon: Daneil Dolin, MD;  [facility];  Service: Endoscopy;  Laterality: N/A;  1:30   COLONOSCOPY N/A 03/26/2017   Procedure: COLONOSCOPY;  Surgeon: Daneil Dolin, MD;  [facility];  Service: Endoscopy;  Laterality: N/A;  930   IR IMAGING GUIDED PORT INSERTION  12/19/2019   LUNG BIOPSY  04/25/2020   Procedure: LUNG BIOPSY;  Surgeon: Rigoberto Noel, MD;  Location: [facility] ENDOSCOPY;  Service: Cardiopulmonary;;   MEDIASTINOSCOPY N/A 05/13/2013   Procedure: MEDIASTINOSCOPY;  Surgeon: Melrose Nakayama, MD;  Location: [facility];  Service: Thoracic;   Laterality: N/A;   PARTIAL HYSTERECTOMY     VAGINAL HYSTERECTOMY     partial hyst    VIDEO BRONCHOSCOPY N/A 04/25/2020   Procedure: VIDEO BRONCHOSCOPY WITH FLUORO;  Surgeon: Rigoberto Noel, MD;  Location: [facility];  Service: Cardiopulmonary;  Laterality: N/A;   VIDEO BRONCHOSCOPY WITH ENDOBRONCHIAL ULTRASOUND N/A 05/13/2013   Procedure: VIDEO BRONCHOSCOPY WITH ENDOBRONCHIAL ULTRASOUND;  Surgeon: Melrose Nakayama, MD;  Location: [facility] OR;  Service: Thoracic;  Laterality: N/A;    Social History   Socioeconomic History   Marital status: Divorced    Spouse name: Not on file   Number of children: 3   Years of education: Not on file   Highest education level: Not on file  Occupational History   Occupation: Merchant navy officer     Employer: [employer]  Tobacco Use   Smoking status: Former    Packs/day: 0.50    Types: Cigarettes    Quit date: 04/19/2013    Years since quitting: 8.6   Smokeless tobacco: Never  Vaping Use   Vaping Use: Never used  Substance and Sexual Activity   Alcohol use: No   Drug use: No   Sexual activity: Yes  Birth control/protection: Surgical  Other Topics Concern   Not on file  Social History Narrative   Not on file   Social Determinants of Health   Financial Resource Strain: Not on file  Food Insecurity: Not on file  Transportation Needs: Not on file  Physical Activity: Not on file  Stress: Not on file  Social Connections: Not on file    Family History  Problem Relation Age of Onset   Colon polyps Mother    Hypertension Mother    Heart disease Mother    Hyperlipidemia Mother    Cancer Father        prostate   Hypertension Father    Hyperlipidemia Father    Heart defect Other        family history    Asthma Other        family history     Outpatient Encounter Medications as of 11/28/2021  Medication Sig   ACCU-CHEK GUIDE test strip USE AS INSTRUCTED   albuterol (PROVENTIL HFA;VENTOLIN HFA) 108 (90 Base) MCG/ACT inhaler Inhale 2 puffs  into the lungs every 4 (four) hours as needed for wheezing or shortness of breath.   blood glucose meter kit and supplies KIT 1 each by Does not apply route 4 (four) times daily. Dispense based on patient and insurance preference. Use up to four times daily as directed. (FOR ICD-9 250.00, 250.01). (Patient not taking: Reported on 04/21/2020)   Blood Glucose Monitoring Suppl (ACCU-CHEK GUIDE) w/Device KIT 1 Piece by Does not apply route as directed. (Patient not taking: Reported on 04/21/2020)   latanoprost (XALATAN) 0.005 % ophthalmic solution 1 drop at bedtime.   metFORMIN (GLUCOPHAGE) 500 MG tablet Take 500 mg by mouth daily with breakfast.   metoprolol tartrate (LOPRESSOR) 25 MG tablet Take 25 mg by mouth 2 (two) times daily.    montelukast (SINGULAIR) 10 MG tablet Take 10 mg by mouth at bedtime.   predniSONE (DELTASONE) 10 MG tablet Take 2 tablets (20 mg total) by mouth 2 (two) times daily.   rosuvastatin (CRESTOR) 10 MG tablet Take 10 mg by mouth daily.   No facility-administered encounter medications on file as of 11/28/2021.    ALLERGIES: Allergies  Allergen Reactions   Iodine Anaphylaxis    Told to avoid due to shellfish allergy. Told to avoid due to shellfish allergy.   Shellfish-Derived Products Anaphylaxis and Swelling    Eyes swelling, scratchy throat, bumps on lips.    VACCINATION STATUS: Immunization History  Administered Date(s) Administered   DTaP / Hep B / IPV 10/04/2014   HiB (PRP-T) 10/04/2014   Influenza,inj,Quad PF,6+ Mos 11/04/2013, 10/04/2014   Influenza-Unspecified 09/14/2018   Moderna Sars-Covid-2 Vaccination 03/08/2020, 04/10/2020   Pneumococcal Conjugate-13 10/04/2014    Diabetes She presents for her follow-up diabetic visit. She has type 2 diabetes mellitus. Onset time: She was diagnosed at approximate age of 60 years. Her disease course has been stable. There are no hypoglycemic associated symptoms. Pertinent negatives for hypoglycemia include no confusion,  headaches, pallor or seizures. Pertinent negatives for diabetes include no chest pain, no fatigue, no polydipsia, no polyphagia and no polyuria. There are no hypoglycemic complications. Symptoms are stable. There are no diabetic complications. Risk factors for coronary artery disease include diabetes mellitus, dyslipidemia, family history, obesity, hypertension, tobacco exposure, sedentary lifestyle and post-menopausal. Current diabetic treatment includes insulin injections (She is currently on Lantus 60 units nightly, Metformin 1000 mg p.o. twice daily.). Her weight is increasing steadily. She is following a generally unhealthy diet. When  asked about meal planning, she reported none. She has not had a previous visit with a dietitian. She rarely participates in exercise. There is no change in her home blood glucose trend. (She presents with controlled diabetes with point-of-care A1c was 6 %, overall improving from 14%.  She is only on metformin 500 mg p.o. once a day.     ) An ACE inhibitor/angiotensin II receptor blocker is being taken. Eye exam is current.  Hyperlipidemia This is a chronic problem. The current episode started more than 1 year ago. Exacerbating diseases include diabetes. Pertinent negatives include no chest pain, myalgias or shortness of breath. Current antihyperlipidemic treatment includes statins. Risk factors for coronary artery disease include dyslipidemia, diabetes mellitus, hypertension, obesity, a sedentary lifestyle, post-menopausal and family history.  Hypertension This is a chronic problem. The current episode started more than 1 year ago. Pertinent negatives include no chest pain, headaches, palpitations or shortness of breath. Risk factors for coronary artery disease include diabetes mellitus, dyslipidemia, family history, obesity, post-menopausal state, smoking/tobacco exposure and sedentary lifestyle. Past treatments include angiotensin blockers.   Review of Systems   Constitutional:  Negative for chills, fatigue, fever and unexpected weight change.  HENT:  Negative for trouble swallowing and voice change.   Eyes:  Negative for visual disturbance.  Respiratory:  Negative for cough, shortness of breath and wheezing.   Cardiovascular:  Negative for chest pain, palpitations and leg swelling.  Gastrointestinal:  Negative for diarrhea, nausea and vomiting.  Endocrine: Negative for cold intolerance, heat intolerance, polydipsia, polyphagia and polyuria.  Musculoskeletal:  Negative for arthralgias and myalgias.  Skin:  Negative for color change, pallor, rash and wound.  Neurological:  Negative for seizures and headaches.  Psychiatric/Behavioral:  Negative for confusion and suicidal ideas.    Objective:    Vitals with BMI 11/28/2021 08/29/2021 08/29/2021  Height 5' 8"  - -  Weight 254 lbs 13 oz - -  BMI 70.48 - -  Systolic 889 169 450  Diastolic 68 86 80  Pulse 72 80 81    BP 110/68    Pulse 72    Ht 5' 8"  (1.727 m)    Wt 254 lb 12.8 oz (115.6 kg)    BMI 38.74 kg/m   Wt Readings from Last 3 Encounters:  11/28/21 254 lb 12.8 oz (115.6 kg)  08/29/21 251 lb (113.9 kg)  05/31/21 246 lb 6.4 oz (111.8 kg)     Physical Exam Constitutional:      Appearance: She is well-developed.  HENT:     Head: Normocephalic and atraumatic.  Neck:     Thyroid: No thyromegaly.     Trachea: No tracheal deviation.  Cardiovascular:     Rate and Rhythm: Normal rate and regular rhythm.  Pulmonary:     Effort: Pulmonary effort is normal.  Abdominal:     Palpations: Abdomen is soft.     Tenderness: There is no abdominal tenderness. There is no guarding.  Musculoskeletal:        General: Normal range of motion.     Cervical back: Normal range of motion and neck supple.  Skin:    General: Skin is warm and dry.     Coloration: Skin is not pale.     Findings: No erythema or rash.  Neurological:     Mental Status: She is alert and oriented to person, place, and time.      Cranial Nerves: No cranial nerve deficit.     Coordination: Coordination normal.  Deep Tendon Reflexes: Reflexes are normal and symmetric.  Psychiatric:        Judgment: Judgment normal.     CMP ( most recent) CMP     Component Value Date/Time   NA 145 (H) 11/22/2021 1334   NA 144 09/04/2017 0916   K 4.5 11/22/2021 1334   K 4.1 09/04/2017 0916   CL 106 11/22/2021 1334   CO2 24 11/22/2021 1334   CO2 26 09/04/2017 0916   GLUCOSE 69 (L) 11/22/2021 1334   GLUCOSE 111 (H) 08/29/2021 0616   GLUCOSE 197 (H) 09/04/2017 0916   BUN 16 11/22/2021 1334   BUN 15.6 09/04/2017 0916   CREATININE 0.94 11/22/2021 1334   CREATININE 0.84 04/05/2020 0900   CREATININE 1.0 09/04/2017 0916   CALCIUM 9.6 11/22/2021 1334   CALCIUM 9.5 09/04/2017 0916   PROT 7.0 11/22/2021 1334   PROT 6.8 09/04/2017 0916   ALBUMIN 4.7 11/22/2021 1334   ALBUMIN 3.8 09/04/2017 0916   AST 20 11/22/2021 1334   AST 18 04/05/2020 0900   AST 19 09/04/2017 0916   ALT 18 11/22/2021 1334   ALT 14 04/05/2020 0900   ALT 18 09/04/2017 0916   ALKPHOS 78 11/22/2021 1334   ALKPHOS 49 09/04/2017 0916   BILITOT 0.4 11/22/2021 1334   BILITOT 0.3 04/05/2020 0900   BILITOT 0.49 09/04/2017 0916   GFRNONAA >60 08/29/2021 0616   GFRNONAA >60 04/05/2020 0900   GFRAA 66 11/05/2020 0927   GFRAA >60 04/05/2020 0900    Diabetic Labs (most recent): Lab Results  Component Value Date   HGBA1C 6.0 11/28/2021   HGBA1C 6.1 05/31/2021   HGBA1C 5.8 11/30/2020    Lab Results  Component Value Date   TSH 3.210 11/22/2021   TSH 3.770 11/05/2020   TSH 2.859 05/06/2013   FREET4 0.97 11/22/2021   FREET4 0.79 (L) 11/05/2020   FREET4 0.93 05/06/2013     Assessment & Plan:   1. Type 2 diabetes mellitus with hyperglycemia, without long-term current use of insulin (HCC)  - Laruen L Langworthy has currently uncontrolled symptomatic type 2 DM since  60 years of age.  She presents with controlled diabetes with point-of-care A1c was 6 %,  overall improving from 14%.  She is only on metformin 500 mg p.o. once a day.    -- Recent labs reviewed.  Her diabetes is recently exacerbated by concurrent use of steroids during treatment with chemo for lymphoma. - I had a long discussion with her about the progressive nature of diabetes and the pathology behind its complications. -her diabetes is complicated by obesity/sedentary life and she remains at a high risk for more acute and chronic complications which include CAD, CVA, CKD, retinopathy, and neuropathy. These are all discussed in detail with her.  - she acknowledges that there is a room for improvement in her food and drink choices. - Suggestion is made for her to avoid simple carbohydrates  from her diet including Cakes, Sweet Desserts, Ice Cream, Soda (diet and regular), Sweet Tea, Candies, Chips, Cookies, Store Bought Juices, Alcohol , Artificial Sweeteners,  Coffee Creamer, and "Sugar-free" Products, Lemonade. This will help patient to have more stable blood glucose profile and potentially avoid unintended weight gain.  The following Lifestyle Medicine recommendations according to [facility]) were discussed and and offered to patient and she  agrees to start the journey:  A. Whole Foods, Plant-Based Nutrition comprising of fruits and vegetables, plant-based proteins, whole-grain carbohydrates was discussed in  detail with the patient.   A list for source of those nutrients were also provided to the patient.  Patient will use only water or unsweetened tea for hydration. B.  The need to stay away from risky substances including alcohol, smoking; obtaining 7 to 9 hours of restorative sleep, at least 150 minutes of moderate intensity exercise weekly, the importance of healthy social connections,  and stress management techniques were discussed. C.  A full color page of  Calorie density of various food groups per pound showing examples of each food groups was  provided to the patient.    -  she is advised to stick to a routine mealtimes to eat 3 meals  a day and avoid unnecessary snacks ( to snack only to correct hypoglycemia).     - she will be scheduled with Jearld Fenton, RDN, CDE for diabetes education.  - I have approached her with the following individualized plan to manage  her diabetes and patient agrees:   -Based on her presentation with controlled glycemic profile and point-of-care A1c of 6%, she will not need any additional treatment.  However, she is advised to continue metformin 500 mg p.o. daily after breakfast.  She does not have to monitor blood glucose.    - she will be considered for incretin therapy as appropriate next visit.  - Specific targets for  A1c;  LDL, HDL,  and Triglycerides were discussed with the patient.  2) Blood Pressure /Hypertension:  Her blood pressure is controlled to target. -  she is advised to continue her current medications including losartan 25 mg p.o. daily with breakfast .  3) Lipids/Hyperlipidemia: She does not have recent lipid panel to review.  She is advised to continue Crestor 10 mg p.o. daily at bedtime.     Side effects and precautions discussed with her.  4)  Weight/Diet:  Body mass index is 38.74 kg/m.  -   clearly complicating her diabetes care.   she is  a candidate for weight loss. I discussed with her the fact that loss of 5 - 10% of her  current body weight will have the most impact on her diabetes management.  Exercise, and detailed carbohydrates information provided  -  detailed on discharge instructions.  5) Chronic Care/Health Maintenance:  -she  is on ACEI/ARB and Statin medications and  is encouraged to initiate and continue to follow up with Ophthalmology, Dentist,  Podiatrist at least yearly or according to recommendations, and advised to   stay away from smoking. I have recommended yearly flu vaccine and pneumonia vaccine at least every 5 years; moderate intensity exercise  for up to 150 minutes weekly; and  sleep for at least 7 hours a day.  - she is  advised to maintain close follow up with Rosita Fire, MD for primary care needs, as well as her other providers for optimal and coordinated care.    I spent 32 minutes in the care of the patient today including review of labs from [place], Lipids, Thyroid Function, Hematology (current and previous including abstractions from other facilities); face-to-face time discussing  her blood glucose readings/logs, discussing hypoglycemia and hyperglycemia episodes and symptoms, medications doses, her options of short and long term treatment based on the latest standards of care / guidelines;  discussion about incorporating lifestyle medicine;  and documenting the encounter.    Please refer to Patient Instructions for Blood Glucose Monitoring and Insulin/Medications Dosing Guide"  in media tab for additional information. Please  also refer  to " Patient Self Inventory" in the Media  tab for reviewed elements of pertinent patient history.  Megan Mcknight participated in the discussions, expressed understanding, and voiced agreement with the above plans.  All questions were answered to her satisfaction. she is encouraged to contact clinic should she have any questions or concerns prior to her return visit.   Follow up plan: - Return in about 6 months (around 05/29/2022) for F/U with Meter and Logs Only - no Labs.  Glade Lloyd, MD [facility] [address] [place], [place] [address] Phone: [phone]  Fax: [phone]    11/28/2021, 4:25 PM  This note was partially dictated with voice recognition software. Similar sounding words can be transcribed inadequately or may not  be corrected upon review.

## 2021-11-28 NOTE — Patient Instructions (Signed)

## 2021-12-05 ENCOUNTER — Ambulatory Visit: Payer: Medicare Other | Admitting: "Endocrinology

## 2022-02-24 DIAGNOSIS — Z9484 Stem cells transplant status: Secondary | ICD-10-CM | POA: Diagnosis not present

## 2022-02-24 DIAGNOSIS — C8460 Anaplastic large cell lymphoma, ALK-positive, unspecified site: Secondary | ICD-10-CM | POA: Diagnosis not present

## 2022-03-11 ENCOUNTER — Encounter: Payer: Self-pay | Admitting: *Deleted

## 2022-03-17 DIAGNOSIS — R600 Localized edema: Secondary | ICD-10-CM | POA: Diagnosis not present

## 2022-03-17 DIAGNOSIS — I10 Essential (primary) hypertension: Secondary | ICD-10-CM | POA: Diagnosis not present

## 2022-03-25 ENCOUNTER — Encounter: Payer: Self-pay | Admitting: *Deleted

## 2022-03-27 ENCOUNTER — Ambulatory Visit (HOSPITAL_COMMUNITY)
Admission: RE | Admit: 2022-03-27 | Discharge: 2022-03-27 | Disposition: A | Discharge status: Home / Self Care | Payer: Managed Care, Other (non HMO) | Source: Ambulatory Visit | Attending: Gerontology | Admitting: Gerontology

## 2022-03-27 ENCOUNTER — Other Ambulatory Visit (HOSPITAL_COMMUNITY): Payer: Self-pay | Admitting: Family Medicine

## 2022-03-27 ENCOUNTER — Other Ambulatory Visit (HOSPITAL_COMMUNITY): Payer: Self-pay | Admitting: Gerontology

## 2022-03-27 DIAGNOSIS — M25551 Pain in right hip: Secondary | ICD-10-CM | POA: Diagnosis not present

## 2022-03-27 DIAGNOSIS — Z1231 Encounter for screening mammogram for malignant neoplasm of breast: Secondary | ICD-10-CM

## 2022-03-27 DIAGNOSIS — R1084 Generalized abdominal pain: Secondary | ICD-10-CM

## 2022-03-27 DIAGNOSIS — M810 Age-related osteoporosis without current pathological fracture: Secondary | ICD-10-CM

## 2022-04-03 ENCOUNTER — Encounter: Payer: Self-pay | Admitting: Oncology

## 2022-04-04 ENCOUNTER — Ambulatory Visit (HOSPITAL_COMMUNITY)
Admission: RE | Admit: 2022-04-04 | Discharge: 2022-04-04 | Disposition: A | Discharge status: Home / Self Care | Payer: Managed Care, Other (non HMO) | Source: Ambulatory Visit | Attending: Gerontology | Admitting: Gerontology

## 2022-04-04 DIAGNOSIS — R1084 Generalized abdominal pain: Secondary | ICD-10-CM | POA: Diagnosis not present

## 2022-04-04 DIAGNOSIS — K7689 Other specified diseases of liver: Secondary | ICD-10-CM | POA: Diagnosis not present

## 2022-05-27 ENCOUNTER — Ambulatory Visit: Payer: Medicare Other

## 2022-05-27 ENCOUNTER — Telehealth: Payer: Self-pay | Admitting: *Deleted

## 2022-05-27 NOTE — Telephone Encounter (Signed)
Referring MD/PCP: Dr. Legrand Rams  Procedure: Colonoscopy  Has patient had this procedure before?  Dr. Gala Romney, 03/26/17  If so, when, by whom and where?    Is there a family history of colon cancer?  no  Who?  What age when diagnosed?    Is patient diabetic?    Yes, type 2       Does patient have prosthetic heart valve or mechanical valve?  no  Do you have a pacemaker/defibrillator?  no  Has patient ever had endocarditis/atrial fibrillation? no  Does patient use oxygen? no  Has patient had joint replacement within last 12 months?  no  Is patient constipated or do they take laxatives? no  Does patient have a history of alcohol/drug use?  no  Have you had a stroke/heart attack last 6 mths? no  Do you take medicine for weight loss?  no  For female patients,: have you had a hysterectomy yes                      are you post menopausal yes                      do you still have your menstrual cycle no  Is patient on blood thinner such as Coumadin, Plavix and/or Aspirin? no  Medications:  Current Outpatient Medications on File Prior to Visit  Medication Sig Dispense Refill   ACCU-CHEK GUIDE test strip USE AS INSTRUCTED 150 strip 2   albuterol (PROVENTIL HFA;VENTOLIN HFA) 108 (90 Base) MCG/ACT inhaler Inhale 2 puffs into the lungs every 4 (four) hours as needed for wheezing or shortness of breath. 1 Inhaler 2   blood glucose meter kit and supplies KIT 1 each by Does not apply route 4 (four) times daily. Dispense based on patient and insurance preference. Use up to four times daily as directed. (FOR ICD-9 250.00, 250.01). (Patient not taking: Reported on 04/21/2020) 1 each 0   Blood Glucose Monitoring Suppl (ACCU-CHEK GUIDE) w/Device KIT 1 Piece by Does not apply route as directed. (Patient not taking: Reported on 04/21/2020) 1 kit 0   latanoprost (XALATAN) 0.005 % ophthalmic solution 1 drop at bedtime.     metFORMIN (GLUCOPHAGE) 500 MG tablet Take 500 mg by mouth daily with breakfast.      metoprolol tartrate (LOPRESSOR) 25 MG tablet Take 25 mg by mouth 2 (two) times daily.      montelukast (SINGULAIR) 10 MG tablet Take 10 mg by mouth at bedtime.     predniSONE (DELTASONE) 10 MG tablet Take 2 tablets (20 mg total) by mouth 2 (two) times daily. 20 tablet 0   rosuvastatin (CRESTOR) 10 MG tablet Take 10 mg by mouth daily.     No current facility-administered medications on file prior to visit.     Allergies:  Allergies  Allergen Reactions   Iodine Anaphylaxis    Told to avoid due to shellfish allergy. Told to avoid due to shellfish allergy.   Shellfish-Derived Products Anaphylaxis and Swelling    Eyes swelling, scratchy throat, bumps on lips.

## 2022-05-29 ENCOUNTER — Ambulatory Visit (INDEPENDENT_AMBULATORY_CARE_PROVIDER_SITE_OTHER): Payer: Managed Care, Other (non HMO) | Admitting: "Endocrinology

## 2022-05-29 ENCOUNTER — Encounter: Payer: Self-pay | Admitting: "Endocrinology

## 2022-05-29 VITALS — BP 110/78 | HR 56 | Ht 68.0 in | Wt 260.2 lb

## 2022-05-29 DIAGNOSIS — E1165 Type 2 diabetes mellitus with hyperglycemia: Secondary | ICD-10-CM | POA: Diagnosis not present

## 2022-05-29 DIAGNOSIS — I10 Essential (primary) hypertension: Secondary | ICD-10-CM

## 2022-05-29 DIAGNOSIS — E782 Mixed hyperlipidemia: Secondary | ICD-10-CM

## 2022-05-29 DIAGNOSIS — Z6839 Body mass index (BMI) 39.0-39.9, adult: Secondary | ICD-10-CM

## 2022-05-29 LAB — POCT GLYCOSYLATED HEMOGLOBIN (HGB A1C): HbA1c, POC (controlled diabetic range): 5.9 % (ref 0.0–7.0)

## 2022-05-29 NOTE — Patient Instructions (Signed)

## 2022-05-29 NOTE — Progress Notes (Signed)
05/29/2022, 4:58 PM  Endocrinology follow-up note   Subjective:    Patient ID: Megan Mcknight, female    DOB: October 09, 1961.  Megan Mcknight is being seen in follow-up after she was seen in consultation for management of currently uncontrolled symptomatic diabetes requested by  Carrolyn Meiers, MD.   Past Medical History:  Diagnosis Date   Acute bronchitis    Anaplastic large cell lymphoma 2014   remission since 2015   Asthma    Cancer ([place])    lung and kidney   Colon polyps 02/2013   Per colonoscopy   Coma ([place])    Diabetes mellitus without complication ([place])    Diverticulosis 02/2013   Per colonoscopy   Dysrhythmia    Former light tobacco smoker    GERD (gastroesophageal reflux disease)    H/O stem cell transplant ([place]) 2015   Hyperlipidemia    Nerve pain    Left leg   Non Hodgkin's lymphoma ([place])    Seasonal allergies     Past Surgical History:  Procedure Laterality Date   bones removed from Baby toes     BRONCHIAL BRUSHINGS  04/25/2020   Procedure: BRONCHIAL BRUSHINGS;  Surgeon: Rigoberto Noel, MD;  Location: [place];  Service: Cardiopulmonary;;   BRONCHIAL WASHINGS  04/25/2020   Procedure: BRONCHIAL WASHINGS;  Surgeon: Rigoberto Noel, MD;  Location: [place];  Service: Cardiopulmonary;;   COLONOSCOPY  02/16/2012   Procedure: COLONOSCOPY;  Surgeon: Daneil Dolin, MD;  [facility];  Service: Endoscopy;  Laterality: N/A;  1:30   COLONOSCOPY N/A 03/26/2017   Procedure: COLONOSCOPY;  Surgeon: Daneil Dolin, MD;  [facility];  Service: Endoscopy;  Laterality: N/A;  930   IR IMAGING GUIDED PORT INSERTION  12/19/2019   LUNG BIOPSY  04/25/2020   Procedure: LUNG BIOPSY;  Surgeon: Rigoberto Noel, MD;  Location: [facility];  Service: Cardiopulmonary;;   MEDIASTINOSCOPY N/A 05/13/2013   Procedure: MEDIASTINOSCOPY;  Surgeon: Melrose Nakayama, MD;  Location: [place];  Service:  Thoracic;  Laterality: N/A;   PARTIAL HYSTERECTOMY     VAGINAL HYSTERECTOMY     partial hyst    VIDEO BRONCHOSCOPY N/A 04/25/2020   Procedure: VIDEO BRONCHOSCOPY WITH FLUORO;  Surgeon: Rigoberto Noel, MD;  Location: [place];  Service: Cardiopulmonary;  Laterality: N/A;   VIDEO BRONCHOSCOPY WITH ENDOBRONCHIAL ULTRASOUND N/A 05/13/2013   Procedure: VIDEO BRONCHOSCOPY WITH ENDOBRONCHIAL ULTRASOUND;  Surgeon: Melrose Nakayama, MD;  Location: [facility] OR;  Service: Thoracic;  Laterality: N/A;    Social History   Socioeconomic History   Marital status: Divorced    Spouse name: Not on file   Number of children: 3   Years of education: Not on file   Highest education level: Not on file  Occupational History   Occupation: Merchant navy officer     Employer: [employer]  Tobacco Use   Smoking status: Former    Packs/day: 0.50    Types: Cigarettes    Quit date: 04/19/2013    Years since quitting: 9.1   Smokeless tobacco: Never  Vaping Use   Vaping Use: Never used  Substance and Sexual Activity   Alcohol use: No   Drug use: No   Sexual activity: Yes  Birth control/protection: Surgical  Other Topics Concern   Not on file  Social History Narrative   Not on file   Social Determinants of Health   Financial Resource Strain: Not on file  Food Insecurity: Not on file  Transportation Needs: Not on file  Physical Activity: Not on file  Stress: Not on file  Social Connections: Not on file    Family History  Problem Relation Age of Onset   Colon polyps Mother    Hypertension Mother    Heart disease Mother    Hyperlipidemia Mother    Cancer Father        prostate   Hypertension Father    Hyperlipidemia Father    Heart defect Other        family history    Asthma Other        family history     Outpatient Encounter Medications as of 05/29/2022  Medication Sig   Cholecalciferol (VITAMIN D) 50 MCG (2000 UT) CAPS Take 2,000 Units by mouth daily with lunch.   gabapentin (NEURONTIN)  100 MG capsule Take 1 capsule by mouth at bedtime.   ACCU-CHEK GUIDE test strip USE AS INSTRUCTED   albuterol (PROVENTIL HFA;VENTOLIN HFA) 108 (90 Base) MCG/ACT inhaler Inhale 2 puffs into the lungs every 4 (four) hours as needed for wheezing or shortness of breath.   blood glucose meter kit and supplies KIT 1 each by Does not apply route 4 (four) times daily. Dispense based on patient and insurance preference. Use up to four times daily as directed. (FOR ICD-9 250.00, 250.01). (Patient not taking: Reported on 04/21/2020)   Blood Glucose Monitoring Suppl (ACCU-CHEK GUIDE) w/Device KIT 1 Piece by Does not apply route as directed. (Patient not taking: Reported on 04/21/2020)   furosemide (LASIX) 20 MG tablet Take 20 mg by mouth daily.   latanoprost (XALATAN) 0.005 % ophthalmic solution 1 drop at bedtime.   metFORMIN (GLUCOPHAGE) 500 MG tablet Take 500 mg by mouth daily with breakfast.   metoprolol tartrate (LOPRESSOR) 25 MG tablet Take 25 mg by mouth 2 (two) times daily.    montelukast (SINGULAIR) 10 MG tablet Take 10 mg by mouth at bedtime.   potassium chloride SA (KLOR-CON M) 20 MEQ tablet Take 20 mEq by mouth daily.   predniSONE (DELTASONE) 10 MG tablet Take 2 tablets (20 mg total) by mouth 2 (two) times daily. (Patient not taking: Reported on 05/29/2022)   rosuvastatin (CRESTOR) 10 MG tablet Take 10 mg by mouth daily.   TRELEGY ELLIPTA 200-62.5-25 MCG/ACT AEPB Take 1 puff by mouth daily.   No facility-administered encounter medications on file as of 05/29/2022.    ALLERGIES: Allergies  Allergen Reactions   Iodine Anaphylaxis    Told to avoid due to shellfish allergy. Told to avoid due to shellfish allergy.   Shellfish-Derived Products Anaphylaxis and Swelling    Eyes swelling, scratchy throat, bumps on lips.    VACCINATION STATUS: Immunization History  Administered Date(s) Administered   DTaP / Hep B / IPV 10/04/2014   HiB (PRP-T) 10/04/2014   Influenza,inj,Quad PF,6+ Mos 11/04/2013,  10/04/2014   Influenza-Unspecified 09/14/2018   Moderna Sars-Covid-2 Vaccination 03/08/2020, 04/10/2020   Pneumococcal Conjugate-13 10/04/2014    Diabetes She presents for her follow-up diabetic visit. She has type 2 diabetes mellitus. Onset time: She was diagnosed at approximate age of 17 years. Her disease course has been improving. There are no hypoglycemic associated symptoms. Pertinent negatives for hypoglycemia include no confusion, headaches, pallor or seizures. Pertinent negatives for  diabetes include no chest pain, no fatigue, no polydipsia, no polyphagia and no polyuria. There are no hypoglycemic complications. Symptoms are improving. There are no diabetic complications. Risk factors for coronary artery disease include diabetes mellitus, dyslipidemia, family history, obesity, hypertension, tobacco exposure, sedentary lifestyle and post-menopausal. Current diabetic treatment includes insulin injections (She is currently on Lantus 60 units nightly, Metformin 1000 mg p.o. twice daily.). Her weight is increasing steadily. She is following a generally unhealthy diet. When asked about meal planning, she reported none. She has not had a previous visit with a dietitian. She rarely participates in exercise. There is no change in her home blood glucose trend. Her breakfast blood glucose range is generally 130-140 mg/dl. Her overall blood glucose range is 130-140 mg/dl. (She presents with controlled diabetes with point-of-care A1c was 6 %, overall improving from 14%.  She is only on metformin 500 mg p.o. once a day.     ) An ACE inhibitor/angiotensin II receptor blocker is being taken. Eye exam is current.  Hyperlipidemia This is a chronic problem. The current episode started more than 1 year ago. Exacerbating diseases include diabetes. Pertinent negatives include no chest pain, myalgias or shortness of breath. Current antihyperlipidemic treatment includes statins. Risk factors for coronary artery disease  include dyslipidemia, diabetes mellitus, hypertension, obesity, a sedentary lifestyle, post-menopausal and family history.  Hypertension This is a chronic problem. The current episode started more than 1 year ago. Pertinent negatives include no chest pain, headaches, palpitations or shortness of breath. Risk factors for coronary artery disease include diabetes mellitus, dyslipidemia, family history, obesity, post-menopausal state, smoking/tobacco exposure and sedentary lifestyle. Past treatments include angiotensin blockers.    Review of Systems  Constitutional:  Negative for chills, fatigue, fever and unexpected weight change.  HENT:  Negative for trouble swallowing and voice change.   Eyes:  Negative for visual disturbance.  Respiratory:  Negative for cough, shortness of breath and wheezing.   Cardiovascular:  Negative for chest pain, palpitations and leg swelling.  Gastrointestinal:  Negative for diarrhea, nausea and vomiting.  Endocrine: Negative for cold intolerance, heat intolerance, polydipsia, polyphagia and polyuria.  Musculoskeletal:  Negative for arthralgias and myalgias.  Skin:  Negative for color change, pallor, rash and wound.  Neurological:  Negative for seizures and headaches.  Psychiatric/Behavioral:  Negative for confusion and suicidal ideas.     Objective:       05/29/2022   10:06 AM 11/28/2021   10:18 AM 08/29/2021    7:30 AM  Vitals with BMI  Height 5' 8"  5' 8"    Weight 260 lbs 3 oz 254 lbs 13 oz   BMI 90.38 33.38   Systolic 329 191 660  Diastolic 78 68 86  Pulse 56 72 80    BP 110/78   Pulse (!) 56   Ht 5' 8"  (1.727 m)   Wt 260 lb 3.2 oz (118 kg)   BMI 39.56 kg/m   Wt Readings from Last 3 Encounters:  05/29/22 260 lb 3.2 oz (118 kg)  11/28/21 254 lb 12.8 oz (115.6 kg)  08/29/21 251 lb (113.9 kg)        CMP ( most recent) CMP     Component Value Date/Time   NA 145 (H) 11/22/2021 1334   NA 144 09/04/2017 0916   K 4.5 11/22/2021 1334   K 4.1  09/04/2017 0916   CL 106 11/22/2021 1334   CO2 24 11/22/2021 1334   CO2 26 09/04/2017 0916   GLUCOSE 69 (L) 11/22/2021 1334  GLUCOSE 111 (H) 08/29/2021 0616   GLUCOSE 197 (H) 09/04/2017 0916   BUN 16 11/22/2021 1334   BUN 15.6 09/04/2017 0916   CREATININE 0.94 11/22/2021 1334   CREATININE 0.84 04/05/2020 0900   CREATININE 1.0 09/04/2017 0916   CALCIUM 9.6 11/22/2021 1334   CALCIUM 9.5 09/04/2017 0916   PROT 7.0 11/22/2021 1334   PROT 6.8 09/04/2017 0916   ALBUMIN 4.7 11/22/2021 1334   ALBUMIN 3.8 09/04/2017 0916   AST 20 11/22/2021 1334   AST 18 04/05/2020 0900   AST 19 09/04/2017 0916   ALT 18 11/22/2021 1334   ALT 14 04/05/2020 0900   ALT 18 09/04/2017 0916   ALKPHOS 78 11/22/2021 1334   ALKPHOS 49 09/04/2017 0916   BILITOT 0.4 11/22/2021 1334   BILITOT 0.3 04/05/2020 0900   BILITOT 0.49 09/04/2017 0916   GFRNONAA >60 08/29/2021 0616   GFRNONAA >60 04/05/2020 0900   GFRAA 66 11/05/2020 0927   GFRAA >60 04/05/2020 0900    Diabetic Labs (most recent): Lab Results  Component Value Date   HGBA1C 5.9 05/29/2022   HGBA1C 6.0 11/28/2021   HGBA1C 6.1 05/31/2021    Lab Results  Component Value Date   TSH 3.210 11/22/2021   TSH 3.770 11/05/2020   TSH 2.859 05/06/2013   FREET4 0.97 11/22/2021   FREET4 0.79 (L) 11/05/2020   FREET4 0.93 05/06/2013     Assessment & Plan:   1. Type 2 diabetes mellitus with hyperglycemia, without long-term current use of insulin (HCC)  - Megan Mcknight has currently uncontrolled symptomatic type 2 DM since  61 years of age.  She presents with controlled diabetes with point-of-care A1c was  5.9% ,  overall improving from 14%.  She is only on metformin 500 mg p.o. once a day.    -- Recent labs reviewed.   - I had a long discussion with her about the progressive nature of diabetes and the pathology behind its complications. -her diabetes is complicated by obesity/sedentary life and she remains at a high risk for more acute and chronic  complications which include CAD, CVA, CKD, retinopathy, and neuropathy. These are all discussed in detail with her.  - she acknowledges that there is a room for improvement in her food and drink choices. - Suggestion is made for her to avoid simple carbohydrates  from her diet including Cakes, Sweet Desserts, Ice Cream, Soda (diet and regular), Sweet Tea, Candies, Chips, Cookies, Store Bought Juices, Alcohol , Artificial Sweeteners,  Coffee Creamer, and "Sugar-free" Products, Lemonade. This will help patient to have more stable blood glucose profile and potentially avoid unintended weight gain.  The following Lifestyle Medicine recommendations according to [place]  [facility]) were discussed and and offered to patient and she  agrees to start the journey:  A. Whole Foods, Plant-Based Nutrition comprising of fruits and vegetables, plant-based proteins, whole-grain carbohydrates was discussed in detail with the patient.   A list for source of those nutrients were also provided to the patient.  Patient will use only water or unsweetened tea for hydration. B.  The need to stay away from risky substances including alcohol, smoking; obtaining 7 to 9 hours of restorative sleep, at least 150 minutes of moderate intensity exercise weekly, the importance of healthy social connections,  and stress management techniques were discussed. C.  A full color page of  Calorie density of various food groups per pound showing examples of each food groups was provided to the patient.    -  she is advised to stick to a routine mealtimes to eat 3 meals  a day and avoid unnecessary snacks ( to snack only to correct hypoglycemia).    - she will be scheduled with Jearld Fenton, RDN, CDE for diabetes education.  - I have approached her with the following individualized plan to manage  her diabetes and patient agrees:   -Based on her presentation with controlled glycemic profile and point-of-care  A1c of 5.9%, she will not need any additional treatment.  However, she is advised to continue metformin 500 mg p.o. daily after breakfast.  She does not have to monitor blood glucose.    - she will be considered for incretin therapy as appropriate next visit.  - Specific targets for  A1c;  LDL, HDL,  and Triglycerides were discussed with the patient.  2) Blood Pressure /Hypertension:  - Her blood pressure is controlled to target. -  she is advised to continue her current medications including losartan 25 mg p.o. daily with breakfast .  3) Lipids/Hyperlipidemia: She does not have recent lipid panel to review.  She is advised to continue Crestor  10 mg p.o. daily at bedtime.     Side effects and precautions discussed with her.  4)  Weight/Diet:  Body mass index is 39.56 kg/m.  -   clearly complicating her diabetes care.   she is  a candidate for weight loss. I discussed with her the fact that loss of 5 - 10% of her  current body weight will have the most impact on her diabetes management.  Exercise, and detailed carbohydrates information provided  -  detailed on discharge instructions.  5) Chronic Care/Health Maintenance:  -she  is on ACEI/ARB and Statin medications and  is encouraged to initiate and continue to follow up with Ophthalmology, Dentist,  Podiatrist at least yearly or according to recommendations, and advised to   stay away from smoking. I have recommended yearly flu vaccine and pneumonia vaccine at least every 5 years; moderate intensity exercise for up to 150 minutes weekly; and  sleep for at least 7 hours a day.  - she is  advised to maintain close follow up with Carrolyn Meiers, MD for primary care needs, as well as her other providers for optimal and coordinated care.  I spent 41 minutes in the care of the patient today including review of labs from [place], Lipids, Thyroid Function, Hematology (current and previous including abstractions from other facilities); face-to-face  time discussing  her blood glucose readings/logs, discussing hypoglycemia and hyperglycemia episodes and symptoms, medications doses, her options of short and long term treatment based on the latest standards of care / guidelines;  discussion about incorporating lifestyle medicine;  and documenting the encounter.    Please refer to Patient Instructions for Blood Glucose Monitoring and Insulin/Medications Dosing Guide"  in media tab for additional information. Please  also refer to " Patient Self Inventory" in the Media  tab for reviewed elements of pertinent patient history.  Megan Mcknight participated in the discussions, expressed understanding, and voiced agreement with the above plans.  All questions were answered to her satisfaction. she is encouraged to contact clinic should she have any questions or concerns prior to her return visit.    Follow up plan: - Return in about 6 months (around 11/28/2022) for Urine MA - [place], F/U with Pre-visit Labs, A1c -[place].  Glade Lloyd, MD [facility] [address] [place], [place] [address] Phone: [phone]  Fax: [phone]    05/29/2022, 4:58 PM  This note was partially dictated with voice recognition software. Similar sounding words can be transcribed inadequately or may not  be corrected upon review.

## 2022-05-30 DIAGNOSIS — R918 Other nonspecific abnormal finding of lung field: Secondary | ICD-10-CM | POA: Diagnosis not present

## 2022-05-30 DIAGNOSIS — C8460 Anaplastic large cell lymphoma, ALK-positive, unspecified site: Secondary | ICD-10-CM | POA: Diagnosis not present

## 2022-05-30 DIAGNOSIS — K5730 Diverticulosis of large intestine without perforation or abscess without bleeding: Secondary | ICD-10-CM | POA: Diagnosis not present

## 2022-06-04 ENCOUNTER — Ambulatory Visit: Payer: Medicare Other

## 2022-06-16 DIAGNOSIS — Z7984 Long term (current) use of oral hypoglycemic drugs: Secondary | ICD-10-CM | POA: Diagnosis not present

## 2022-06-16 DIAGNOSIS — C787 Secondary malignant neoplasm of liver and intrahepatic bile duct: Secondary | ICD-10-CM | POA: Diagnosis not present

## 2022-06-16 DIAGNOSIS — C7802 Secondary malignant neoplasm of left lung: Secondary | ICD-10-CM | POA: Diagnosis not present

## 2022-06-16 DIAGNOSIS — Z8572 Personal history of non-Hodgkin lymphomas: Secondary | ICD-10-CM | POA: Diagnosis not present

## 2022-06-16 DIAGNOSIS — Z08 Encounter for follow-up examination after completed treatment for malignant neoplasm: Secondary | ICD-10-CM | POA: Diagnosis not present

## 2022-06-16 DIAGNOSIS — C7951 Secondary malignant neoplasm of bone: Secondary | ICD-10-CM | POA: Diagnosis not present

## 2022-06-16 DIAGNOSIS — Z9484 Stem cells transplant status: Secondary | ICD-10-CM | POA: Diagnosis not present

## 2022-06-16 DIAGNOSIS — Z888 Allergy status to other drugs, medicaments and biological substances status: Secondary | ICD-10-CM | POA: Diagnosis not present

## 2022-06-16 DIAGNOSIS — C7801 Secondary malignant neoplasm of right lung: Secondary | ICD-10-CM | POA: Diagnosis not present

## 2022-06-16 DIAGNOSIS — Z9221 Personal history of antineoplastic chemotherapy: Secondary | ICD-10-CM | POA: Diagnosis not present

## 2022-06-16 DIAGNOSIS — E119 Type 2 diabetes mellitus without complications: Secondary | ICD-10-CM | POA: Diagnosis not present

## 2022-06-16 DIAGNOSIS — Z95828 Presence of other vascular implants and grafts: Secondary | ICD-10-CM | POA: Diagnosis not present

## 2022-06-16 DIAGNOSIS — C8460 Anaplastic large cell lymphoma, ALK-positive, unspecified site: Secondary | ICD-10-CM | POA: Diagnosis not present

## 2022-06-18 NOTE — Telephone Encounter (Signed)
Colonoscopy 2018 with one 4 mm polyp in sigmoid colon. Polypoid mucosa. 5 year screening, FH of colon polyps.   No metformin day of procedure. BMI is 40 per outside records. ASA 3. I am not sure if this needs visit or not, as it is only due to BMI.

## 2022-06-18 NOTE — Telephone Encounter (Signed)
Please schedule OV thanks

## 2022-07-07 ENCOUNTER — Other Ambulatory Visit: Payer: Self-pay

## 2022-07-15 ENCOUNTER — Other Ambulatory Visit: Payer: Self-pay

## 2022-07-21 DIAGNOSIS — I10 Essential (primary) hypertension: Secondary | ICD-10-CM | POA: Diagnosis not present

## 2022-07-21 DIAGNOSIS — E1165 Type 2 diabetes mellitus with hyperglycemia: Secondary | ICD-10-CM | POA: Diagnosis not present

## 2022-07-21 DIAGNOSIS — E782 Mixed hyperlipidemia: Secondary | ICD-10-CM | POA: Diagnosis not present

## 2022-07-22 NOTE — Progress Notes (Unsigned)
Primary Care Physician:  Carrolyn Meiers, MD  Primary GI: Dr. Gala Romney  Patient Location: Home   Provider Location: [facility] office   Reason for Visit: surveillance colonoscopy   Persons present on the virtual encounter, with roles: Megan Mcknight - patient and Venetia Night, NP   Total time (minutes) spent on medical discussion: 14 minutes  Virtual Visit Encounter Note Visit is conducted virtually and was requested by patient.   I connected with Megan Mcknight on 07/23/22 at  9:30 AM EDT by video and verified that I am speaking with the correct person using two identifiers.   I discussed the limitations, risks, security and privacy concerns of performing an evaluation and management service by video and the availability of in person appointments. I also discussed with the patient that there may be a patient responsible charge related to this service. The patient expressed understanding and agreed to proceed.  Chief Complaint  Patient presents with   Colonoscopy    No other issues.     History of Present Illness: Megan Mcknight is a 61 year old female with history of acute bronchitis, anaplastic large cell lymphoma, asthma, colon polyps, lung and kidney cancer, non-Hodgkin's lymphoma s/p stem cell transplant in 2015, HLD, GERD, diverticulosis, and diabetes who presents for video visit to discuss scheduling surveillance colonoscopy.   Last colonoscopy 2018 with single 4 mm polyp in sigmoid colon with polypoid mucosa. Recommend repeat in 5 years.  Patient has family history of colon polyps.   Today: Height: 5'8" Weight: 262 BMI: 39.84  Today: Daily soft bowel movements, no straining. Denies diarrhea, unintentional weight loss, lack of appetite,  nausea, vomiting, dysphagia, GERD, melena, hematochezia, fatigue. Has occasional abdominal pain in the lower abdomen that does not occur often and is short lasting. Blood sugars have been stable, metformin for prediabetes.   Denies  chest pain, shortness of breath. Edema is controlled with lasix.   Doing well from lymphoma standpoint, had stem cell transplant that lasted for 7 days.    Past Medical History:  Diagnosis Date   Acute bronchitis    Anaplastic large cell lymphoma 2014   remission since 2015   Asthma    Cancer ([place])    lung and kidney   Colon polyps 02/2013   Per colonoscopy   Coma ([place])    Diabetes mellitus without complication ([place])    Diverticulosis 02/2013   Per colonoscopy   Dysrhythmia    Former light tobacco smoker    GERD (gastroesophageal reflux disease)    H/O stem cell transplant ([place]) 2015   Hyperlipidemia    Nerve pain    Left leg   Non Hodgkin's lymphoma ([place])    Seasonal allergies      Past Surgical History:  Procedure Laterality Date   bones removed from Baby toes     BRONCHIAL BRUSHINGS  04/25/2020   Procedure: BRONCHIAL BRUSHINGS;  Surgeon: Rigoberto Noel, MD;  Location: [place];  Service: Cardiopulmonary;;   BRONCHIAL WASHINGS  04/25/2020   Procedure: BRONCHIAL WASHINGS;  Surgeon: Rigoberto Noel, MD;  Location: [place];  Service: Cardiopulmonary;;   COLONOSCOPY  02/16/2012   Procedure: COLONOSCOPY;  Surgeon: Daneil Dolin, MD;  [facility];  Service: Endoscopy;  Laterality: N/A;  1:30   COLONOSCOPY N/A 03/26/2017   Procedure: COLONOSCOPY;  Surgeon: Daneil Dolin, MD;  [facility];  Service: Endoscopy;  Laterality: N/A;  930   IR IMAGING GUIDED PORT INSERTION  12/19/2019  LUNG BIOPSY  04/25/2020   Procedure: LUNG BIOPSY;  Surgeon: Rigoberto Noel, MD;  Location: [facility] ENDOSCOPY;  Service: Cardiopulmonary;;   MEDIASTINOSCOPY N/A 05/13/2013   Procedure: MEDIASTINOSCOPY;  Surgeon: Melrose Nakayama, MD;  Location: [place];  Service: Thoracic;  Laterality: N/A;   PARTIAL HYSTERECTOMY     VAGINAL HYSTERECTOMY     partial hyst    VIDEO BRONCHOSCOPY N/A 04/25/2020   Procedure: VIDEO BRONCHOSCOPY WITH FLUORO;  Surgeon: Rigoberto Noel, MD;  Location: [place];  Service: Cardiopulmonary;  Laterality: N/A;   VIDEO BRONCHOSCOPY WITH ENDOBRONCHIAL ULTRASOUND N/A 05/13/2013   Procedure: VIDEO BRONCHOSCOPY WITH ENDOBRONCHIAL ULTRASOUND;  Surgeon: Melrose Nakayama, MD;  [facility] OR;  Service: Thoracic;  Laterality: N/A;     Current Meds  Medication Sig   albuterol (PROVENTIL HFA;VENTOLIN HFA) 108 (90 Base) MCG/ACT inhaler Inhale 2 puffs into the lungs every 4 (four) hours as needed for wheezing or shortness of breath.   furosemide (LASIX) 20 MG tablet Take 20 mg by mouth daily.   gabapentin (NEURONTIN) 100 MG capsule Take 1 capsule by mouth at bedtime.   latanoprost (XALATAN) 0.005 % ophthalmic solution 1 drop at bedtime.   metFORMIN (GLUCOPHAGE) 500 MG tablet Take 500 mg by mouth daily with breakfast.   metoprolol tartrate (LOPRESSOR) 25 MG tablet Take 25 mg by mouth 2 (two) times daily.    montelukast (SINGULAIR) 10 MG tablet Take 10 mg by mouth at bedtime.   potassium chloride SA (KLOR-CON M) 20 MEQ tablet Take 20 mEq by mouth daily.   rosuvastatin (CRESTOR) 10 MG tablet Take 10 mg by mouth daily.   TRELEGY ELLIPTA 200-62.5-25 MCG/ACT AEPB Take 1 puff by mouth daily.     Family History  Problem Relation Age of Onset   Colon polyps Mother    Hypertension Mother    Heart disease Mother    Hyperlipidemia Mother    Cancer Father        prostate   Hypertension Father    Hyperlipidemia Father    Heart defect Other        family history    Asthma Other        family history     Social History   Socioeconomic History   Marital status: Divorced    Spouse name: Not on file   Number of children: 3   Years of education: Not on file   Highest education level: Not on file  Occupational History   Occupation: Merchant navy officer     Employer: [employer]  Tobacco Use   Smoking status: Former    Packs/day: 0.50    Types: Cigarettes    Quit date: 04/19/2013    Years since quitting: 9.2   Smokeless tobacco: Never  Vaping Use    Vaping Use: Never used  Substance and Sexual Activity   Alcohol use: Yes    Comment: occ   Drug use: No   Sexual activity: Yes    Birth control/protection: Surgical  Other Topics Concern   Not on file  Social History Narrative   Not on file   Social Determinants of Health   Financial Resource Strain: Not on file  Food Insecurity: Not on file  Transportation Needs: Not on file  Physical Activity: Not on file  Stress: Not on file  Social Connections: Not on file       Review of Systems: Gen: Denies fever, chills, anorexia. Denies fatigue, weakness, weight loss.  CV: Denies chest pain, palpitations, syncope,  peripheral edema, and claudication. Resp: Denies dyspnea at rest, cough, wheezing, coughing up blood, and pleurisy. GI: see HPI Derm: Denies rash, itching, dry skin Psych: Denies depression, anxiety, memory loss, confusion. No homicidal or suicidal ideation.  Heme: Denies bruising, bleeding, and enlarged lymph nodes.  Observations/Objective: No distress. Alert and oriented. Pleasant. Well nourished. Normal mood and affect. Unable to perform complete physical exam due to video encounter. No video available.    Assessment:  Family history of colon polyps/personal history of colon polyps: Patient's family history of colon polyps in her mother.  Denies any family history of colon cancer.  Currently she denies any melena, hematochezia, hematemesis, nausea/vomiting, unintentional weight loss, constipation, diarrhea, lack of appetite, early satiety, or dysphagia.  She also denies any chest pain or shortness of breath.  She does have some lower extremity edema that is controlled with Lasix.  She reports stable blood sugars.  Now on metformin due to prediabetes.  Patient reports she is doing well from a lymphoma standpoint and has been in remission.  Plan: Proceed with colonoscopy by Dr. Gala Romney in near future: the risks, benefits, and alternatives have been discussed with the patient  in detail. The patient states understanding and desires to proceed.  ASA 2 BMP pre-op No metformin morning of procedure   Follow Up Instructions:  I discussed the assessment and treatment plan with the patient. The patient was provided an opportunity to ask questions and all were answered. The patient agreed with the plan and demonstrated an understanding of the instructions.   The patient was advised to call back or seek an in-person evaluation if the symptoms worsen or if the condition fails to improve as anticipated.    Venetia Night, MSN, FNP-BC, AGACNP-BC [facility]

## 2022-07-23 ENCOUNTER — Other Ambulatory Visit: Payer: Self-pay | Admitting: *Deleted

## 2022-07-23 ENCOUNTER — Encounter: Payer: Self-pay | Admitting: *Deleted

## 2022-07-23 ENCOUNTER — Telehealth: Payer: Self-pay

## 2022-07-23 ENCOUNTER — Encounter: Payer: Self-pay | Admitting: Gastroenterology

## 2022-07-23 ENCOUNTER — Telehealth (INDEPENDENT_AMBULATORY_CARE_PROVIDER_SITE_OTHER): Payer: Managed Care, Other (non HMO) | Admitting: Gastroenterology

## 2022-07-23 ENCOUNTER — Telehealth: Payer: Self-pay | Admitting: Gastroenterology

## 2022-07-23 VITALS — Ht 68.0 in | Wt 262.0 lb

## 2022-07-23 DIAGNOSIS — Z8371 Family history of colonic polyps: Secondary | ICD-10-CM | POA: Diagnosis not present

## 2022-07-23 DIAGNOSIS — Z86010 Personal history of colonic polyps: Secondary | ICD-10-CM

## 2022-07-23 MED ORDER — NA SULFATE-K SULFATE-MG SULF 17.5-3.13-1.6 GM/177ML PO SOLN: ORAL | 0 refills | Status: AC

## 2022-07-23 NOTE — Patient Instructions (Addendum)
It was a pleasure to see you today! We are scheduling for colonoscopy in the near future with Dr. Gala Romney!  I want to create trusting relationships with patients. If you receive a survey regarding your visit,  I greatly appreciate you taking time to fill this out on paper or through your MyChart. I value your feedback.  Venetia Night, MSN, FNP-BC, AGACNP-BC [facility]

## 2022-07-23 NOTE — Telephone Encounter (Signed)
Colonoscopy with propofol with Dr. Gala Romney, ASA 2  Needs BMP prior to procedure.  Hold metformin morning of procedure.  FX:OVANVB history of colon polyps/personal history of colon polyps

## 2022-07-23 NOTE — Telephone Encounter (Signed)
Megan Mcknight, you are scheduled for a virtual visit with your provider today.  Just as we do with appointments in the office, we must obtain your consent to participate.  Your consent will be active for this visit and any virtual visit you may have with one of our providers in the next 365 days.  If you have a MyChart account, I can also send a copy of this consent to you electronically.  All virtual visits are billed to your insurance company just like a traditional visit in the office.  As this is a virtual visit, video technology does not allow for your provider to perform a traditional examination.  This may limit your provider's ability to fully assess your condition.  If your provider identifies any concerns that need to be evaluated in person or the need to arrange testing such as labs, EKG, etc, we will make arrangements to do so.  Although advances in technology are sophisticated, we cannot ensure that it will always work on either your end or our end.  If the connection with a video visit is poor, we may have to switch to a telephone visit.  With either a video or telephone visit, we are not always able to ensure that we have a secure connection.   I need to obtain your verbal consent now.   Are you willing to proceed with your visit today? Yes.

## 2022-07-23 NOTE — Telephone Encounter (Signed)
Spoke with and scheduled colonoscopy for 08/07/22 at 2 pm.  Instructions to be mailed and prep has been sent to the pharmacy.

## 2022-07-30 ENCOUNTER — Other Ambulatory Visit (HOSPITAL_COMMUNITY)
Admission: RE | Admit: 2022-07-30 | Discharge: 2022-07-30 | Disposition: A | Discharge status: Home / Self Care | Payer: Managed Care, Other (non HMO) | Source: Ambulatory Visit | Attending: Internal Medicine | Admitting: Internal Medicine

## 2022-07-30 DIAGNOSIS — Z86010 Personal history of colonic polyps: Secondary | ICD-10-CM | POA: Insufficient documentation

## 2022-07-30 DIAGNOSIS — Z8371 Family history of colonic polyps: Secondary | ICD-10-CM | POA: Insufficient documentation

## 2022-07-30 LAB — BASIC METABOLIC PANEL
Anion gap: 4 — ABNORMAL LOW (ref 5–15)
BUN: 19 mg/dL (ref 6–20)
CO2: 28 mmol/L (ref 22–32)
Calcium: 9.2 mg/dL (ref 8.9–10.3)
Chloride: 109 mmol/L (ref 98–111)
Creatinine, Ser: 1.03 mg/dL — ABNORMAL HIGH (ref 0.44–1.00)
GFR, Estimated: >60 mL/min (ref >60)
Glucose, Bld: 122 mg/dL — ABNORMAL HIGH (ref 70–99)
Potassium: 3.8 mmol/L (ref 3.5–5.1)
Sodium: 141 mmol/L (ref 135–145)

## 2022-08-07 ENCOUNTER — Ambulatory Visit (HOSPITAL_COMMUNITY): Payer: Managed Care, Other (non HMO) | Admitting: Anesthesiology

## 2022-08-07 ENCOUNTER — Encounter (HOSPITAL_COMMUNITY)
Admission: RE | Disposition: A | Discharge status: Home / Self Care | Payer: Self-pay | Source: Home / Self Care | Attending: Internal Medicine

## 2022-08-07 ENCOUNTER — Encounter (HOSPITAL_COMMUNITY): Payer: Self-pay | Admitting: Internal Medicine

## 2022-08-07 ENCOUNTER — Other Ambulatory Visit: Payer: Self-pay

## 2022-08-07 ENCOUNTER — Ambulatory Visit (HOSPITAL_COMMUNITY)
Admission: RE | Admit: 2022-08-07 | Discharge: 2022-08-07 | Disposition: A | Discharge status: Home / Self Care | Payer: Managed Care, Other (non HMO) | Attending: Internal Medicine | Admitting: Internal Medicine

## 2022-08-07 DIAGNOSIS — K5730 Diverticulosis of large intestine without perforation or abscess without bleeding: Secondary | ICD-10-CM | POA: Diagnosis not present

## 2022-08-07 DIAGNOSIS — E119 Type 2 diabetes mellitus without complications: Secondary | ICD-10-CM | POA: Insufficient documentation

## 2022-08-07 DIAGNOSIS — Z8371 Family history of colonic polyps: Secondary | ICD-10-CM | POA: Insufficient documentation

## 2022-08-07 DIAGNOSIS — D123 Benign neoplasm of transverse colon: Secondary | ICD-10-CM

## 2022-08-07 DIAGNOSIS — Q438 Other specified congenital malformations of intestine: Secondary | ICD-10-CM | POA: Insufficient documentation

## 2022-08-07 DIAGNOSIS — Z87891 Personal history of nicotine dependence: Secondary | ICD-10-CM | POA: Insufficient documentation

## 2022-08-07 DIAGNOSIS — Z1211 Encounter for screening for malignant neoplasm of colon: Secondary | ICD-10-CM | POA: Insufficient documentation

## 2022-08-07 DIAGNOSIS — K635 Polyp of colon: Secondary | ICD-10-CM | POA: Diagnosis not present

## 2022-08-07 DIAGNOSIS — Z800 Family history of malignant neoplasm of digestive organs: Secondary | ICD-10-CM | POA: Diagnosis not present

## 2022-08-07 DIAGNOSIS — K219 Gastro-esophageal reflux disease without esophagitis: Secondary | ICD-10-CM | POA: Insufficient documentation

## 2022-08-07 HISTORY — PX: COLONOSCOPY WITH PROPOFOL: SHX5780

## 2022-08-07 HISTORY — PX: POLYPECTOMY: SHX5525

## 2022-08-07 LAB — GLUCOSE, CAPILLARY: Glucose-Capillary: 108 mg/dL — ABNORMAL HIGH (ref 70–99)

## 2022-08-07 SURGERY — COLONOSCOPY WITH PROPOFOL
Anesthesia: General

## 2022-08-07 MED ORDER — LACTATED RINGERS IV SOLN: INTRAVENOUS | Status: DC | PRN

## 2022-08-07 MED ORDER — PROPOFOL 1000 MG/100ML IV EMUL
INTRAVENOUS | Status: AC
  Filled 2022-08-07: qty 100

## 2022-08-07 MED ORDER — PROPOFOL 10 MG/ML IV BOLUS
INTRAVENOUS | Status: DC | PRN
  Administered 2022-08-07 (×2): 50 mg via INTRAVENOUS
  Administered 2022-08-07: 20 mg via INTRAVENOUS

## 2022-08-07 MED ORDER — PROPOFOL 500 MG/50ML IV EMUL
INTRAVENOUS | Status: DC | PRN
  Administered 2022-08-07: 150 ug/kg/min via INTRAVENOUS

## 2022-08-07 MED ORDER — LIDOCAINE HCL (PF) 2 % IJ SOLN
INTRAMUSCULAR | Status: AC
  Filled 2022-08-07: qty 5

## 2022-08-07 NOTE — Addendum Note (Signed)
Addendum  created 08/07/22 1041 by Ollen Bowl, CRNA   Clinical Note Signed

## 2022-08-07 NOTE — Anesthesia Postprocedure Evaluation (Signed)
Anesthesia Post Note  Patient: Megan Mcknight  Procedure(s) Performed: COLONOSCOPY WITH PROPOFOL POLYPECTOMY  Patient location during evaluation: Endoscopy Anesthesia Type: General Level of consciousness: awake and alert Pain management: pain level controlled Vital Signs Assessment: post-procedure vital signs reviewed and stable Respiratory status: spontaneous breathing Cardiovascular status: blood pressure returned to baseline Postop Assessment: no apparent nausea or vomiting Anesthetic complications: no   No notable events documented.   Last Vitals:  Vitals:   08/07/22 0649 08/07/22 0810  BP: (!) 163/81 111/62  Pulse: 70 66  Resp: 15 11  Temp: 36.7 C 37 C  SpO2: 98% 100%    Last Pain:  Vitals:   08/07/22 0810  TempSrc: Oral  PainSc: 0-No pain                 Jory Tanguma

## 2022-08-07 NOTE — H&P (Signed)
@LOGO @   Primary Care Physician:  Carrolyn Meiers, MD Primary Gastroenterologist:  Dr. Gala Romney  Pre-Procedure History & Physical: HPI:  Megan Mcknight is a 61 y.o. female here for high rescreening colonoscopy.  Father with a multiple colonic polyps.  Benign polyp removed 5 years ago.  No bowel symptoms at this time.  Past Medical History:  Diagnosis Date   Acute bronchitis    Anaplastic large cell lymphoma 2014   remission since 2015   Asthma    Cancer ([place])    lung and kidney   Colon polyps 02/2013   Per colonoscopy   Coma ([place])    Diabetes mellitus without complication ([place])    Diverticulosis 02/2013   Per colonoscopy   Dysrhythmia    Former light tobacco smoker    GERD (gastroesophageal reflux disease)    H/O stem cell transplant ([place]) 2015   Hyperlipidemia    Nerve pain    Left leg   Non Hodgkin's lymphoma ([place])    Seasonal allergies     Past Surgical History:  Procedure Laterality Date   bones removed from Baby toes     BRONCHIAL BRUSHINGS  04/25/2020   Procedure: BRONCHIAL BRUSHINGS;  Surgeon: Rigoberto Noel, MD;  Location: [place];  Service: Cardiopulmonary;;   BRONCHIAL WASHINGS  04/25/2020   Procedure: BRONCHIAL WASHINGS;  Surgeon: Rigoberto Noel, MD;  Location: [place];  Service: Cardiopulmonary;;   COLONOSCOPY  02/16/2012   Procedure: COLONOSCOPY;  Surgeon: Daneil Dolin, MD;  [facility];  Service: Endoscopy;  Laterality: N/A;  1:30   COLONOSCOPY N/A 03/26/2017   Procedure: COLONOSCOPY;  Surgeon: Daneil Dolin, MD;  [facility];  Service: Endoscopy;  Laterality: N/A;  930   IR IMAGING GUIDED PORT INSERTION  12/19/2019   LUNG BIOPSY  04/25/2020   Procedure: LUNG BIOPSY;  Surgeon: Rigoberto Noel, MD;  Location: [facility] ENDOSCOPY;  Service: Cardiopulmonary;;   MEDIASTINOSCOPY N/A 05/13/2013   Procedure: MEDIASTINOSCOPY;  Surgeon: Melrose Nakayama, MD;  Location: [place];  Service: Thoracic;  Laterality: N/A;   PARTIAL  HYSTERECTOMY     VAGINAL HYSTERECTOMY     partial hyst    VIDEO BRONCHOSCOPY N/A 04/25/2020   Procedure: VIDEO BRONCHOSCOPY WITH FLUORO;  Surgeon: Rigoberto Noel, MD;  Location: [place];  Service: Cardiopulmonary;  Laterality: N/A;   VIDEO BRONCHOSCOPY WITH ENDOBRONCHIAL ULTRASOUND N/A 05/13/2013   Procedure: VIDEO BRONCHOSCOPY WITH ENDOBRONCHIAL ULTRASOUND;  Surgeon: Melrose Nakayama, MD;  Location: [place];  Service: Thoracic;  Laterality: N/A;    Prior to Admission medications   Medication Sig Start Date End Date Taking? Authorizing Provider  Ascorbic Acid (VITAMIN C PO) Take 1 tablet by mouth in the morning.   Yes [provider]  Cholecalciferol (VITAMIN D3 PO) Take 1 tablet by mouth in the morning.   Yes [provider]  furosemide (LASIX) 20 MG tablet Take 20 mg by mouth in the morning. 05/16/22  Yes [provider]  gabapentin (NEURONTIN) 100 MG capsule Take 100 mg by mouth in the morning. 11/08/21  Yes [provider]  latanoprost (XALATAN) 0.005 % ophthalmic solution Place 1 drop into both eyes at bedtime. 12/13/19  Yes [provider]  metFORMIN (GLUCOPHAGE) 500 MG tablet Take 500 mg by mouth daily with breakfast.   Yes [provider]  metoprolol tartrate (LOPRESSOR) 25 MG tablet Take 25 mg by mouth 2 (two) times daily.    Yes [provider]  montelukast (  SINGULAIR) 10 MG tablet Take 10 mg by mouth in the morning.   Yes [provider]  potassium chloride SA (KLOR-CON M) 20 MEQ tablet Take 20 mEq by mouth in the morning. 05/18/22  Yes [provider]  rosuvastatin (CRESTOR) 10 MG tablet Take 10 mg by mouth in the morning.   Yes [provider]  Donnal Debar 200-62.5-25 MCG/ACT AEPB Take 1 puff by mouth in the morning. 05/14/22  Yes [provider]  albuterol (PROVENTIL HFA;VENTOLIN HFA) 108 (90 Base) MCG/ACT inhaler Inhale 2 puffs into the lungs every 4 (four) hours as needed for  wheezing or shortness of breath. 10/27/16   Orpah Greek, MD  Na Sulfate-K Sulfate-Mg Sulf 17.5-3.13-1.6 GM/177ML SOLN As directed 07/23/22   Sophie Quiles, Cristopher Estimable, MD  naproxen sodium (ALEVE) 220 MG tablet Take 220-440 mg by mouth 2 (two) times daily as needed (back pain.).    [provider]    Allergies as of 07/23/2022 - Review Complete 07/23/2022  Allergen Reaction Noted   Iodine Anaphylaxis 12/13/2013   Shellfish-derived products Anaphylaxis and Swelling 03/25/2011    Family History  Problem Relation Age of Onset   Colon polyps Mother    Hypertension Mother    Heart disease Mother    Hyperlipidemia Mother    Cancer Father        prostate   Hypertension Father    Hyperlipidemia Father    Heart defect Other        family history    Asthma Other        family history    Colon cancer Neg Hx     Social History   Socioeconomic History   Marital status: Divorced    Spouse name: Not on file   Number of children: 3   Years of education: Not on file   Highest education level: Not on file  Occupational History   Occupation: Lobbyist: [employer]  Tobacco Use   Smoking status: Former    Packs/day: 0.50    Types: Cigarettes    Quit date: 04/19/2013    Years since quitting: 9.3   Smokeless tobacco: Never  Vaping Use   Vaping Use: Never used  Substance and Sexual Activity   Alcohol use: Yes    Comment: occassionally   Drug use: No   Sexual activity: Yes    Birth control/protection: Surgical  Other Topics Concern   Not on file  Social History Narrative   Not on file   Social Determinants of Health   Financial Resource Strain: Not on file  Food Insecurity: Not on file  Transportation Needs: Not on file  Physical Activity: Not on file  Stress: Not on file  Social Connections: Not on file  Intimate Partner Violence: Not on file    Review of Systems: See HPI, otherwise negative ROS  Physical Exam: BP (!) 163/81   Pulse 70    Temp 98 F (36.7 C) (Oral)   Resp 15   SpO2 98%  General:   Alert,  Well-developed, well-nourished, pleasant and cooperative in NAD Mouth:  No deformity or lesions. Neck:  Supple; no masses or thyromegaly. No significant cervical adenopathy. Lungs:  Clear throughout to auscultation.   No wheezes, crackles, or rhonchi. No acute distress. Heart:  Regular rate and rhythm; no murmurs, clicks, rubs,  or gallops. Abdomen: Non-distended, normal bowel sounds.  Soft and nontender without appreciable mass or hepatosplenomegaly.  Pulses:  Normal pulses noted. Extremities:  Without clubbing or edema.  Impression/Plan: 61 year old lady here for high risk screening colonoscopy.  The risks, benefits, limitations, alternatives and imponderables have been reviewed with the patient. Questions have been answered. All parties are agreeable.     Notice: This dictation was prepared with Dragon dictation along with smaller phrase technology. Any transcriptional errors that result from this process are unintentional and may not be corrected upon review.

## 2022-08-07 NOTE — Op Note (Signed)
[facility] Patient Name: Megan Mcknight Procedure Date: 08/07/2022 7:01 AM MRN: [id number] Date of Birth: 10/13/1961 Attending MD: Norvel Richards , MD CSN: [id number] Age: 61 Admit Type: Outpatient Procedure:                Colonoscopy Indications:              Colon cancer screening in patient at increased                            risk: Family history of 1st-degree relative with                            colon polyps Providers:                Norvel Richards, MD, Lambert Mody,                            Kristine L. Risa Grill, Technician, Bethel Born, Merchant navy officer Referring MD:              Medicines:                Propofol per Anesthesia Complications:            No immediate complications. Estimated Blood Loss:     Estimated blood loss was minimal. Procedure:                Pre-Anesthesia Assessment:                           - Prior to the procedure, a History and Physical                            was performed, and patient medications and                            allergies were reviewed. The patient's tolerance of                            previous anesthesia was also reviewed. The risks                            and benefits of the procedure and the sedation                            options and risks were discussed with the patient.                            All questions were answered, and informed consent                            was obtained. Prior Anticoagulants: The patient has                            taken no  previous anticoagulant or antiplatelet                            agents. ASA Grade Assessment: II - A patient with                            mild systemic disease. After reviewing the risks                            and benefits, the patient was deemed in                            satisfactory condition to undergo the procedure.                           After obtaining informed consent,  the colonoscope                            was passed under direct vision. Throughout the                            procedure, the patient's blood pressure, pulse, and                            oxygen saturations were monitored continuously. The                            [id number]) scope was introduced through the                            anus and advanced to the the cecum, identified by                            appendiceal orifice and ileocecal valve. The entire                            colon was well visualized. Scope In: 7:43:46 AM Scope Out: 8:04:26 AM Scope Withdrawal Time: 0 hours 11 minutes 50 seconds  Total Procedure Duration: 0 hours 20 minutes 40 seconds  Findings:      The perianal and digital rectal examinations were normal.      Scattered medium-mouthed diverticula were found in the entire colon.       Redundant and elongated colon. External abdominal pressure required to       reach the cecum.      A 9 mm polyp was found in the splenic flexure. The polyp was sessile.       The polyp was removed with a cold snare. Resection and retrieval were       complete. Estimated blood loss was minimal. Estimated blood loss was       minimal.      The exam was otherwise without abnormality on direct and retroflexion       views. Impression:               - Diverticulosis in the entire examined colon.  Redundant and elongated colon. External abdominal                            pressure required to reach the cecum.                           - One 9 mm polyp at the splenic flexure, removed                            with a cold snare. Resected and retrieved.                           - The examination was otherwise normal on direct                            and retroflexion views. Moderate Sedation:      Moderate (conscious) sedation was personally administered by an       anesthesia professional. The following parameters were monitored:  oxygen       saturation, heart rate, blood pressure, respiratory rate, EKG, adequacy       of pulmonary ventilation, and response to care. Recommendation:           - Patient has a contact number available for                            emergencies. The signs and symptoms of potential                            delayed complications were discussed with the                            patient. Return to normal activities tomorrow.                            Written discharge instructions were provided to the                            patient.                           - Advance diet as tolerated.                           - Continue present medications.                           - Repeat colonoscopy date to be determined after                            pending pathology results are reviewed for                            surveillance.                           - Return to GI office (date  not yet determined). Procedure Code(s):        --- Professional ---                           [phone], Colonoscopy, flexible; with removal of                            tumor(s), polyp(s), or other lesion(s) by snare                            technique Diagnosis Code(s):        --- Professional ---                           Z83.71, Family history of colonic polyps                           K63.5, Polyp of colon                           K57.30, Diverticulosis of large intestine without                            perforation or abscess without bleeding CPT copyright 2019 American [employer] Association. All rights reserved. The codes documented in this report are preliminary and upon coder review may  be revised to meet current compliance requirements. Cristopher Estimable. Maynard David, MD Norvel Richards, MD 08/07/2022 8:18:12 AM This report has been signed electronically. Number of Addenda: 0

## 2022-08-07 NOTE — Anesthesia Preprocedure Evaluation (Signed)
Anesthesia Evaluation  Patient identified by MRN, date of birth, ID band Patient awake    Reviewed: Allergy & Precautions, H&P , NPO status , Patient's Chart, lab work & pertinent test results, reviewed documented beta blocker date and time   Airway Mallampati: II  TM Distance: >3 FB Neck ROM: full    Dental no notable dental hx.    Pulmonary shortness of breath, asthma , former smoker,    Pulmonary exam normal breath sounds clear to auscultation       Cardiovascular Exercise Tolerance: Good hypertension,  Rhythm:regular Rate:Normal     Neuro/Psych negative neurological ROS  negative psych ROS   GI/Hepatic Neg liver ROS, GERD  Medicated,  Endo/Other  negative endocrine ROSdiabetes  Renal/GU negative Renal ROS  negative genitourinary   Musculoskeletal   Abdominal   Peds  Hematology  (+) Blood dyscrasia, anemia ,   Anesthesia Other Findings   Reproductive/Obstetrics negative OB ROS                             Anesthesia Physical Anesthesia Plan  ASA: 2  Anesthesia Plan: General   Post-op Pain Management:    Induction:   PONV Risk Score and Plan: Propofol infusion  Airway Management Planned:   Additional Equipment:   Intra-op Plan:   Post-operative Plan:   Informed Consent: I have reviewed the patients History and Physical, chart, labs and discussed the procedure including the risks, benefits and alternatives for the proposed anesthesia with the patient or authorized representative who has indicated his/her understanding and acceptance.     Dental Advisory Given  Plan Discussed with: CRNA  Anesthesia Plan Comments:         Anesthesia Quick Evaluation

## 2022-08-07 NOTE — Transfer of Care (Addendum)
Immediate Anesthesia Transfer of Care Note  Patient: Megan Mcknight  Procedure(s) Performed: COLONOSCOPY WITH PROPOFOL POLYPECTOMY  Patient Location: Endoscopy Unit  Anesthesia Type:General  Level of Consciousness: awake  Airway & Oxygen Therapy: Patient Spontanous Breathing  Post-op Assessment: Report given to RN  Post vital signs: Reviewed and stable  Last Vitals:  Vitals Value Taken Time  BP 111/62   37 37   Pulse 66   Resp 11   SpO2 100     Last Pain:  Vitals:   08/07/22 0738  TempSrc:   PainSc: 0-No pain      Patients Stated Pain Goal: 6 (35/57/32 2025)  Complications: No notable events documented.

## 2022-08-07 NOTE — Discharge Instructions (Signed)
  Colonoscopy Discharge Instructions  Read the instructions outlined below and refer to this sheet in the next few weeks. These discharge instructions provide you with general information on caring for yourself after you leave the hospital. Your doctor may also give you specific instructions. While your treatment has been planned according to the most current medical practices available, unavoidable complications occasionally occur. If you have any problems or questions after discharge, call Dr. Gala Romney at [phone]. ACTIVITY You may resume your regular activity, but move at a slower pace for the next 24 hours.  Take frequent rest periods for the next 24 hours.  Walking will help get rid of the air and reduce the bloated feeling in your belly (abdomen).  No driving for 24 hours (because of the medicine (anesthesia) used during the test).   Do not sign any important legal documents or operate any machinery for 24 hours (because of the anesthesia used during the test).  NUTRITION Drink plenty of fluids.  You may resume your normal diet as instructed by your doctor.  Begin with a light meal and progress to your normal diet. Heavy or fried foods are harder to digest and may make you feel sick to your stomach (nauseated).  Avoid alcoholic beverages for 24 hours or as instructed.  MEDICATIONS You may resume your normal medications unless your doctor tells you otherwise.  WHAT YOU CAN EXPECT TODAY Some feelings of bloating in the abdomen.  Passage of more gas than usual.  Spotting of blood in your stool or on the toilet paper.  IF YOU HAD POLYPS REMOVED DURING THE COLONOSCOPY: No aspirin products for 7 days or as instructed.  No alcohol for 7 days or as instructed.  Eat a soft diet for the next 24 hours.  FINDING OUT THE RESULTS OF YOUR TEST Not all test results are available during your visit. If your test results are not back during the visit, make an appointment with your caregiver to find out the  results. Do not assume everything is normal if you have not heard from your caregiver or the medical facility. It is important for you to follow up on all of your test results.  SEEK IMMEDIATE MEDICAL ATTENTION IF: You have more than a spotting of blood in your stool.  Your belly is swollen (abdominal distention).  You are nauseated or vomiting.  You have a temperature over 101.  You have abdominal pain or discomfort that is severe or gets worse throughout the day.    Diverticulosis and colon polyp information from  1 polyp removed from your colon today  Further recommendations to follow pending review of pathology report  At patient request, called Megan Mcknight [phone] rolled to voicemail.  Left a message.

## 2022-08-11 LAB — SURGICAL PATHOLOGY

## 2022-08-12 ENCOUNTER — Encounter (HOSPITAL_COMMUNITY): Payer: Self-pay | Admitting: Internal Medicine

## 2022-08-14 ENCOUNTER — Encounter: Payer: Self-pay | Admitting: Internal Medicine

## 2022-08-21 ENCOUNTER — Ambulatory Visit (INDEPENDENT_AMBULATORY_CARE_PROVIDER_SITE_OTHER): Payer: Managed Care, Other (non HMO) | Admitting: Obstetrics & Gynecology

## 2022-08-21 ENCOUNTER — Encounter: Payer: Self-pay | Admitting: Obstetrics & Gynecology

## 2022-08-21 VITALS — BP 122/80 | HR 71 | Ht 68.0 in | Wt 265.0 lb

## 2022-08-21 DIAGNOSIS — Z1231 Encounter for screening mammogram for malignant neoplasm of breast: Secondary | ICD-10-CM

## 2022-08-21 DIAGNOSIS — Z01419 Encounter for gynecological examination (general) (routine) without abnormal findings: Secondary | ICD-10-CM

## 2022-08-21 NOTE — Progress Notes (Signed)
WELL-WOMAN EXAMINATION Patient name: Megan Mcknight MRN [id number]  Date of birth: 06/25/1961 Chief Complaint:   Gynecologic Exam  History of Present Illness:   Megan Mcknight is a 61 y.o. G3P0 PM, [place] female being seen today for a routine well-woman exam with h/o non-Hodgkins lymphoma  Previously noted pelvic pain.  Started about 2 mos ago, mostly on her left side.  Seemed to be intermittent.  Seen by PCP- hip xray and abd [place] completed- imaging reviewed- no abnormalities noted.  Of note, pain has has since resolved.    On occasion she also notes cloudy urine.  Denies dysuria, hematuria.  Denies urinary frequency, incontinence.  No urinary concerns currently. No other acute complaints  Concerned about her weight- trying to go to a vegetarian diet.   No LMP recorded. Patient has had a hysterectomy.  The current method of family planning is status post hysterectomy.    Last pap n/a.  Last mammogram: 10/2021- completes yearly. Last colonoscopy: up to date     08/21/2022    9:16 AM 03/05/2020    9:06 AM 08/03/2014    1:24 PM 05/31/2014    1:36 PM  Depression screen PHQ 2/9  Decreased Interest 0 0 0 0  Down, Depressed, Hopeless 0 0 0 0  PHQ - 2 Score 0 0 0 0  Altered sleeping 0     Tired, decreased energy 0     Change in appetite 0     Feeling bad or failure about yourself  0     Trouble concentrating 0     Moving slowly or fidgety/restless 0     Suicidal thoughts 0     PHQ-9 Score 0         Review of Systems:   Pertinent items are noted in HPI Denies any headaches, blurred vision, fatigue, shortness of breath, chest pain, abdominal pain, bowel movements, urination, or intercourse unless otherwise stated above.  Pertinent History Reviewed:  Reviewed past medical,surgical, social and family history.  Reviewed problem list, medications and allergies. Physical Assessment:   Vitals:   08/21/22 0909  BP: 122/80  Pulse: 71  Weight: 265 lb (120.2 kg)  Height: 5\' 8"  (1.727  m)  Body mass index is 40.29 kg/m.        Physical Examination:   General appearance - well appearing, and in no distress  Mental status - alert, oriented to person, place, and time  Psych:  She has a normal mood and affect  Skin - warm and dry, normal color, no suspicious lesions noted  Chest - effort normal, all lung fields clear to auscultation bilaterally  Heart - normal rate and regular rhythm  Neck:  midline trachea, no thyromegaly or nodules  Breasts - breasts appear normal, no suspicious masses, no skin or nipple changes or  axillary nodes  Abdomen - obese, soft, nontender, nondistended, no masses or organomegaly  Pelvic - VULVA: normal appearing vulva with no masses, tenderness or lesions  VAGINA: normal appearing vagina with normal color and discharge, no lesions.  Vaginal cuff intact- no abnormalities noted.Uterus and cervix surgically absent.  On bimanual exam- no masses, tenderness or abnormalities noted.  Extremities:  No swelling or varicosities noted, no calf tenderness bilaterally  Chaperone:  pt declined      Assessment & Plan:  1) Well-Woman Exam Pap not indicated, due to prior hysterectomy Mammogram due to Nov  2) Weight management -discussed resources to assist  -reviewed small changes to help her meet  her goals  Orders Placed This Encounter  Procedures   MM 3D SCREEN BREAST BILATERAL    Meds: No orders of the defined types were placed in this encounter.   Follow-up: Return in about 2 years (around 08/21/2024) for annual.   Janyth Pupa, DO Attending [place], [facility] for [employer], [place]

## 2022-09-15 DIAGNOSIS — Z23 Encounter for immunization: Secondary | ICD-10-CM | POA: Diagnosis not present

## 2022-09-15 DIAGNOSIS — E669 Obesity, unspecified: Secondary | ICD-10-CM | POA: Diagnosis not present

## 2022-09-15 DIAGNOSIS — E1165 Type 2 diabetes mellitus with hyperglycemia: Secondary | ICD-10-CM | POA: Diagnosis not present

## 2022-10-22 ENCOUNTER — Ambulatory Visit (HOSPITAL_COMMUNITY): Payer: Managed Care, Other (non HMO)

## 2022-11-05 DIAGNOSIS — H2513 Age-related nuclear cataract, bilateral: Secondary | ICD-10-CM | POA: Diagnosis not present

## 2022-11-05 DIAGNOSIS — E119 Type 2 diabetes mellitus without complications: Secondary | ICD-10-CM | POA: Diagnosis not present

## 2022-11-05 DIAGNOSIS — H40013 Open angle with borderline findings, low risk, bilateral: Secondary | ICD-10-CM | POA: Diagnosis not present

## 2022-11-05 DIAGNOSIS — H33011 Retinal detachment with single break, right eye: Secondary | ICD-10-CM | POA: Diagnosis not present

## 2022-11-19 DIAGNOSIS — E1165 Type 2 diabetes mellitus with hyperglycemia: Secondary | ICD-10-CM | POA: Diagnosis not present

## 2022-11-20 ENCOUNTER — Encounter: Payer: Self-pay | Admitting: Oncology

## 2022-11-20 LAB — COMPREHENSIVE METABOLIC PANEL
ALT: 17 IU/L (ref 0–32)
AST: 17 IU/L (ref 0–40)
Albumin/Globulin Ratio: 1.6 (ref 1.2–2.2)
Albumin: 4.1 g/dL (ref 3.9–4.9)
Alkaline Phosphatase: 69 IU/L (ref 44–121)
BUN/Creatinine Ratio: 12 (ref 12–28)
BUN: 13 mg/dL (ref 8–27)
Bilirubin Total: 0.3 mg/dL (ref 0.0–1.2)
CO2: 26 mmol/L (ref 20–29)
Calcium: 9.6 mg/dL (ref 8.7–10.3)
Chloride: 104 mmol/L (ref 96–106)
Creatinine, Ser: 1.13 mg/dL — ABNORMAL HIGH (ref 0.57–1.00)
Globulin, Total: 2.5 g/dL (ref 1.5–4.5)
Glucose: 145 mg/dL — ABNORMAL HIGH (ref 70–99)
Potassium: 4.4 mmol/L (ref 3.5–5.2)
Sodium: 143 mmol/L (ref 134–144)
Total Protein: 6.6 g/dL (ref 6.0–8.5)
eGFR: 55 mL/min/{1.73_m2} — ABNORMAL LOW (ref >59)

## 2022-11-20 LAB — LIPID PANEL
Chol/HDL Ratio: 3.1 ratio (ref 0.0–4.4)
Cholesterol, Total: 154 mg/dL (ref 100–199)
HDL: 50 mg/dL (ref >39)
LDL Chol Calc (NIH): 76 mg/dL (ref 0–99)
Triglycerides: 161 mg/dL — ABNORMAL HIGH (ref 0–149)
VLDL Cholesterol Cal: 28 mg/dL (ref 5–40)

## 2022-11-20 LAB — T4, FREE: Free T4: 0.94 ng/dL (ref 0.82–1.77)

## 2022-11-20 LAB — TSH: TSH: 4.93 u[IU]/mL — ABNORMAL HIGH (ref 0.450–4.500)

## 2022-11-28 ENCOUNTER — Encounter: Payer: Self-pay | Admitting: "Endocrinology

## 2022-11-28 ENCOUNTER — Ambulatory Visit (INDEPENDENT_AMBULATORY_CARE_PROVIDER_SITE_OTHER): Payer: Managed Care, Other (non HMO) | Admitting: "Endocrinology

## 2022-11-28 VITALS — BP 120/74 | HR 56 | Ht 68.0 in | Wt 272.2 lb

## 2022-11-28 DIAGNOSIS — E038 Other specified hypothyroidism: Secondary | ICD-10-CM

## 2022-11-28 DIAGNOSIS — E782 Mixed hyperlipidemia: Secondary | ICD-10-CM

## 2022-11-28 DIAGNOSIS — I10 Essential (primary) hypertension: Secondary | ICD-10-CM | POA: Diagnosis not present

## 2022-11-28 DIAGNOSIS — E1165 Type 2 diabetes mellitus with hyperglycemia: Secondary | ICD-10-CM | POA: Diagnosis not present

## 2022-11-28 DIAGNOSIS — Z6839 Body mass index (BMI) 39.0-39.9, adult: Secondary | ICD-10-CM | POA: Diagnosis not present

## 2022-11-28 LAB — POCT GLYCOSYLATED HEMOGLOBIN (HGB A1C): HbA1c, POC (controlled diabetic range): 7.2 % — AB (ref 0.0–7.0)

## 2022-11-28 MED ORDER — METFORMIN HCL 500 MG PO TABS: 500.0000 mg | ORAL_TABLET | Freq: Two times a day (BID) | ORAL | 1 refills | Status: DC

## 2022-11-28 NOTE — Progress Notes (Signed)
11/28/2022, 12:49 PM  Endocrinology follow-up note   Subjective:    Patient ID: Megan Mcknight, female    DOB: Aug 06, 1961.  Megan Mcknight is being seen in follow-up after she was seen in follow-up after she was seen in  consultation for management of currently uncontrolled symptomatic diabetes requested by  Benetta Spar, MD.   Past Medical History:  Diagnosis Date   Acute bronchitis    Anaplastic large cell lymphoma 2014   remission since 2015   Asthma    Cancer (HCC)    lung and kidney   Colon polyps 02/2013   Per colonoscopy   Coma (HCC)    Diabetes mellitus without complication (HCC)    Diverticulosis 02/2013   Per colonoscopy   Dysrhythmia    Former light tobacco smoker    GERD (gastroesophageal reflux disease)    H/O stem cell transplant (HCC) 2015   Hyperlipidemia    Nerve pain    Left leg   Non Hodgkin's lymphoma (HCC)    Seasonal allergies     Past Surgical History:  Procedure Laterality Date   bones removed from Baby toes     BRONCHIAL BRUSHINGS  04/25/2020   Procedure: BRONCHIAL BRUSHINGS;  Surgeon: Oretha Milch, MD;  Location: [facility] ENDOSCOPY;  Service: Cardiopulmonary;;   BRONCHIAL WASHINGS  04/25/2020   Procedure: BRONCHIAL WASHINGS;  Surgeon: Oretha Milch, MD;  Location: [facility] ENDOSCOPY;  Service: Cardiopulmonary;;   COLONOSCOPY  02/16/2012   Procedure: COLONOSCOPY;  Surgeon: Corbin Ade, MD;  Location: [facility] ENDO SUITE;  Service: Endoscopy;  Laterality: N/A;  1:30   COLONOSCOPY N/A 03/26/2017   Procedure: COLONOSCOPY;  Surgeon: Corbin Ade, MD;  Location: [facility] ENDO SUITE;  Service: Endoscopy;  Laterality: N/A;  930   COLONOSCOPY WITH PROPOFOL N/A 08/07/2022   Procedure: COLONOSCOPY WITH PROPOFOL;  Surgeon: Corbin Ade, MD;  Location: [facility] ENDO SUITE;  Service: Endoscopy;  Laterality: N/A;  2:00PM   IR IMAGING GUIDED PORT INSERTION  12/19/2019   LUNG BIOPSY  04/25/2020    Procedure: LUNG BIOPSY;  Surgeon: Oretha Milch, MD;  Location: [facility] ENDOSCOPY;  Service: Cardiopulmonary;;   MEDIASTINOSCOPY N/A 05/13/2013   Procedure: MEDIASTINOSCOPY;  Surgeon: Loreli Slot, MD;  Location: [facility] OR;  Service: Thoracic;  Laterality: N/A;   POLYPECTOMY  08/07/2022   Procedure: POLYPECTOMY;  Surgeon: Corbin Ade, MD;  Location: [facility] ENDO SUITE;  Service: Endoscopy;;   VAGINAL HYSTERECTOMY     vaginal hyst (ovaries insitu)   VIDEO BRONCHOSCOPY N/A 04/25/2020   Procedure: VIDEO BRONCHOSCOPY WITH FLUORO;  Surgeon: Oretha Milch, MD;  Location: [facility] ENDOSCOPY;  Service: Cardiopulmonary;  Laterality: N/A;   VIDEO BRONCHOSCOPY WITH ENDOBRONCHIAL ULTRASOUND N/A 05/13/2013   Procedure: VIDEO BRONCHOSCOPY WITH ENDOBRONCHIAL ULTRASOUND;  Surgeon: Loreli Slot, MD;  Location[facility] OR;  Service: Thoracic;  Laterality: N/A;    Social History   Socioeconomic History   Marital status: Divorced    Spouse name: Not on file   Number of children: 3   Years of education: Not on file   Highest education level: Not on file  Occupational History   Occupation: Pensions consultant     Employer: [employer]  Tobacco Use   Smoking status: Former  Packs/day: 0.50    Types: Cigarettes    Quit date: 04/19/2013    Years since quitting: 9.6   Smokeless tobacco: Never  Vaping Use   Vaping Use: Never used  Substance and Sexual Activity   Alcohol use: Yes    Comment: occassionally   Drug use: No   Sexual activity: Not Currently    Birth control/protection: Surgical  Other Topics Concern   Not on file  Social History Narrative   Not on file   Social Determinants of Health   Financial Resource Strain: Low Risk  (08/21/2022)   Overall Financial Resource Strain (CARDIA)    Difficulty of Paying Living Expenses: Not hard at all  Food Insecurity: No Food Insecurity (08/21/2022)   Hunger Vital Sign    Worried About Running Out of Food in the Last Year: Never true    Ran Out of Food in  the Last Year: Never true  Transportation Needs: No Transportation Needs (08/21/2022)   PRAPARE - Administrator, Civil Service (Medical): No    Lack of Transportation (Non-Medical): No  Physical Activity: Insufficiently Active (08/21/2022)   Exercise Vital Sign    Days of Exercise per Week: 5 days    Minutes of Exercise per Session: 20 min  Stress: No Stress Concern Present (08/21/2022)   [employer] of Occupational Health - Occupational Stress Questionnaire    Feeling of Stress : Not at all  Social Connections: Moderately Integrated (08/21/2022)   Social Connection and Isolation Panel [NHANES]    Frequency of Communication with Friends and Family: More than three times a week    Frequency of Social Gatherings with Friends and Family: Twice a week    Attends Religious Services: More than 4 times per year    Active Member of [employer] or Organizations: Yes    Attends Banker Meetings: 1 to 4 times per year    Marital Status: Divorced    Family History  Problem Relation Age of Onset   Colon polyps Mother    Hypertension Mother    Heart disease Mother    Hyperlipidemia Mother    Cancer Father        prostate   Hypertension Father    Hyperlipidemia Father    Heart defect Other        family history    Asthma Other        family history    Colon cancer Neg Hx     Outpatient Encounter Medications as of 11/28/2022  Medication Sig   metoprolol tartrate (LOPRESSOR) 25 MG tablet Take 25 mg by mouth 2 (two) times daily.   naproxen sodium (ALEVE) 220 MG tablet Take 220-440 mg by mouth 2 (two) times daily as needed (back pain.).   albuterol (PROVENTIL HFA;VENTOLIN HFA) 108 (90 Base) MCG/ACT inhaler Inhale 2 puffs into the lungs every 4 (four) hours as needed for wheezing or shortness of breath.   Ascorbic Acid (VITAMIN C PO) Take 1 tablet by mouth in the morning.   Cholecalciferol (VITAMIN D3 PO) Take 1 tablet by mouth in the morning.   furosemide (LASIX) 20 MG  tablet Take 20 mg by mouth in the morning.   gabapentin (NEURONTIN) 100 MG capsule Take 100 mg by mouth in the morning.   latanoprost (XALATAN) 0.005 % ophthalmic solution Place 1 drop into both eyes at bedtime.   metFORMIN (GLUCOPHAGE) 500 MG tablet Take 1 tablet (500 mg total) by mouth 2 (two) times daily with a meal.  montelukast (SINGULAIR) 10 MG tablet Take 10 mg by mouth in the morning.   Na Sulfate-K Sulfate-Mg Sulf 17.5-3.13-1.6 GM/177ML SOLN As directed (Patient not taking: Reported on 08/21/2022)   potassium chloride SA (KLOR-CON M) 20 MEQ tablet Take 20 mEq by mouth in the morning.   rosuvastatin (CRESTOR) 10 MG tablet Take 10 mg by mouth in the morning.   TRELEGY ELLIPTA 200-62.5-25 MCG/ACT AEPB Take 1 puff by mouth in the morning.   [DISCONTINUED] metFORMIN (GLUCOPHAGE) 500 MG tablet Take 500 mg by mouth daily with breakfast.   No facility-administered encounter medications on file as of 11/28/2022.    ALLERGIES: Allergies  Allergen Reactions   Iodine Anaphylaxis    Told to avoid due to shellfish allergy. Told to avoid due to shellfish allergy.   Shellfish-Derived Products Anaphylaxis and Swelling    Eyes swelling, scratchy throat, bumps on lips.    VACCINATION STATUS: Immunization History  Administered Date(s) Administered   DTaP / Hep B / IPV 10/04/2014   HIB (PRP-T) 10/04/2014   Influenza,inj,Quad PF,6+ Mos 11/04/2013, 10/04/2014   Influenza-Unspecified 09/14/2018   Moderna Sars-Covid-2 Vaccination 03/08/2020, 04/10/2020   Pneumococcal Conjugate-13 10/04/2014    Diabetes She presents for her follow-up diabetic visit. She has type 2 diabetes mellitus. Onset time: She was diagnosed at approximate age of 50 years. Her disease course has been worsening. There are no hypoglycemic associated symptoms. Pertinent negatives for hypoglycemia include no confusion, headaches, pallor or seizures. Pertinent negatives for diabetes include no chest pain, no fatigue, no polydipsia,  no polyphagia and no polyuria. There are no hypoglycemic complications. Symptoms are worsening. There are no diabetic complications. Risk factors for coronary artery disease include diabetes mellitus, dyslipidemia, family history, obesity, hypertension, tobacco exposure, sedentary lifestyle and post-menopausal. Current diabetic treatment includes insulin injections (She is currently on Lantus 60 units nightly, Metformin 1000 mg p.o. twice daily.). Her weight is increasing steadily. She is following a generally unhealthy diet. When asked about meal planning, she reported none. She has not had a previous visit with a dietitian. She rarely participates in exercise. Her home blood glucose trend is increasing steadily. Her breakfast blood glucose range is generally 140-180 mg/dl. Her overall blood glucose range is 140-180 mg/dl. (She presents with increased glycemic profile and point-of-care A1c was 7.2% increasing from 5.9%.  This is an overall improvement from 14%.  She remains on 500 mg of metformin once a day.      ) An ACE inhibitor/angiotensin II receptor blocker is being taken. Eye exam is current.  Hyperlipidemia This is a chronic problem. The current episode started more than 1 year ago. Exacerbating diseases include diabetes. Pertinent negatives include no chest pain, myalgias or shortness of breath. Current antihyperlipidemic treatment includes statins. Risk factors for coronary artery disease include dyslipidemia, diabetes mellitus, hypertension, obesity, a sedentary lifestyle, post-menopausal and family history.  Hypertension This is a chronic problem. The current episode started more than 1 year ago. Pertinent negatives include no chest pain, headaches, palpitations or shortness of breath. Risk factors for coronary artery disease include diabetes mellitus, dyslipidemia, family history, obesity, post-menopausal state, smoking/tobacco exposure and sedentary lifestyle. Past treatments include  angiotensin blockers.    Review of Systems  Constitutional:  Negative for chills, fatigue, fever and unexpected weight change.  HENT:  Negative for trouble swallowing and voice change.   Eyes:  Negative for visual disturbance.  Respiratory:  Negative for cough, shortness of breath and wheezing.   Cardiovascular:  Negative for chest pain, palpitations and leg  swelling.  Gastrointestinal:  Negative for diarrhea, nausea and vomiting.  Endocrine: Negative for cold intolerance, heat intolerance, polydipsia, polyphagia and polyuria.  Musculoskeletal:  Negative for arthralgias and myalgias.  Skin:  Negative for color change, pallor, rash and wound.  Neurological:  Negative for seizures and headaches.  Psychiatric/Behavioral:  Negative for confusion and suicidal ideas.     Objective:       11/28/2022    9:39 AM 08/21/2022    9:09 AM 08/07/2022    8:10 AM  Vitals with BMI  Height 5\' 8"  5\' 8"    Weight 272 lbs 3 oz 265 lbs   BMI 41.4 40.3   Systolic 120 122 308  Diastolic 74 80 62  Pulse 56 71 66    BP 120/74   Pulse (!) 56   Ht 5\' 8"  (1.727 m)   Wt 272 lb 3.2 oz (123.5 kg)   BMI 41.39 kg/m   Wt Readings from Last 3 Encounters:  11/28/22 272 lb 3.2 oz (123.5 kg)  08/21/22 265 lb (120.2 kg)  07/23/22 262 lb (118.8 kg)      CMP ( most recent) CMP     Component Value Date/Time   NA 143 11/19/2022 1041   NA 144 09/04/2017 0916   K 4.4 11/19/2022 1041   K 4.1 09/04/2017 0916   CL 104 11/19/2022 1041   CO2 26 11/19/2022 1041   CO2 26 09/04/2017 0916   GLUCOSE 145 (H) 11/19/2022 1041   GLUCOSE 122 (H) 07/30/2022 0932   GLUCOSE 197 (H) 09/04/2017 0916   BUN 13 11/19/2022 1041   BUN 15.6 09/04/2017 0916   CREATININE 1.13 (H) 11/19/2022 1041   CREATININE 0.84 04/05/2020 0900   CREATININE 1.0 09/04/2017 0916   CALCIUM 9.6 11/19/2022 1041   CALCIUM 9.5 09/04/2017 0916   PROT 6.6 11/19/2022 1041   PROT 6.8 09/04/2017 0916   ALBUMIN 4.1 11/19/2022 1041   ALBUMIN 3.8  09/04/2017 0916   AST 17 11/19/2022 1041   AST 18 04/05/2020 0900   AST 19 09/04/2017 0916   ALT 17 11/19/2022 1041   ALT 14 04/05/2020 0900   ALT 18 09/04/2017 0916   ALKPHOS 69 11/19/2022 1041   ALKPHOS 49 09/04/2017 0916   BILITOT 0.3 11/19/2022 1041   BILITOT 0.3 04/05/2020 0900   BILITOT 0.49 09/04/2017 0916   GFRNONAA >60 07/30/2022 0932   GFRNONAA >60 04/05/2020 0900   GFRAA 66 11/05/2020 0927   GFRAA >60 04/05/2020 0900    Diabetic Labs (most recent): Lab Results  Component Value Date   HGBA1C 7.2 (A) 11/28/2022   HGBA1C 5.9 05/29/2022   HGBA1C 6.0 11/28/2021    Lab Results  Component Value Date   TSH 4.930 (H) 11/19/2022   TSH 3.210 11/22/2021   TSH 3.770 11/05/2020   TSH 2.859 05/06/2013   FREET4 0.94 11/19/2022   FREET4 0.97 11/22/2021   FREET4 0.79 (L) 11/05/2020   FREET4 0.93 05/06/2013     Assessment & Plan:   1. Type 2 diabetes mellitus with hyperglycemia, without long-term current use of insulin (HCC)  - Megan Mcknight has currently uncontrolled symptomatic type 2 DM since  61 years of age.  She presents with increased glycemic profile and point-of-care A1c was 7.2% increasing from 5.9%.  This is an overall improvement from 14%.  She remains on 500 mg of metformin once a day.    -- Recent labs reviewed.   - I had a long discussion with her about the progressive nature  of diabetes and the pathology behind its complications. -her diabetes is complicated by obesity/sedentary life and she remains at a high risk for more acute and chronic complications which include CAD, CVA, CKD, retinopathy, and neuropathy. These are all discussed in detail with her.  - she acknowledges that there is a room for improvement in her food and drink choices. - Suggestion is made for her to avoid simple carbohydrates  from her diet including Cakes, Sweet Desserts, Ice Cream, Soda (diet and regular), Sweet Tea, Candies, Chips, Cookies, Store Bought Juices, Alcohol , Artificial  Sweeteners,  Coffee Creamer, and "Sugar-free" Products, Lemonade. This will help patient to have more stable blood glucose profile and potentially avoid unintended weight gain.  The following Lifestyle Medicine recommendations according to American College of Lifestyle Medicine  [facility]) were discussed and and offered to patient and she  agrees to start the journey:  A. Whole Foods, Plant-Based Nutrition comprising of fruits and vegetables, plant-based proteins, whole-grain carbohydrates was discussed in detail with the patient.   A list for source of those nutrients were also provided to the patient.  Patient will use only water or unsweetened tea for hydration. B.  The need to stay away from risky substances including alcohol, smoking; obtaining 7 to 9 hours of restorative sleep, at least 150 minutes of moderate intensity exercise weekly, the importance of healthy social connections,  and stress management techniques were discussed. C.  A full color page of  Calorie density of various food groups per pound showing examples of each food groups was provided to the patient.   -  she is advised to stick to a routine mealtimes to eat 3 meals  a day and avoid unnecessary snacks ( to snack only to correct hypoglycemia).    - she will be scheduled with Norm Salt, RDN, CDE for diabetes education.  - I have approached her with the following individualized plan to manage  her diabetes and patient agrees:   -Based on her presentation with increased glycemic profile with increased A1c of 7.2%, she is advised to increase metformin to 500 mg p.o. twice daily.   She does not have to monitor blood glucose.    - she will be considered for incretin therapy as appropriate next visit.  - Specific targets for  A1c;  LDL, HDL,  and Triglycerides were discussed with the patient.  2) Blood Pressure /Hypertension:  Her blood pressure is controlled to target. -  she is advised to continue her current medications  including losartan 25 mg p.o. daily with breakfast .  3) Lipids/Hyperlipidemia: She does not have recent lipid panel to review.  She is advised to continue Crestor 9 mg p.o. daily at bedtime.  Whole food plant-based diet will help her control dyslipidemia along with her statin.     Side effects and precautions discussed with her.  4)  Weight/Diet:  Body mass index is 41.39 kg/m.  -   clearly complicating her diabetes care.   she is  a candidate for weight loss. I discussed with her the fact that loss of 5 - 10% of her  current body weight will have the most impact on her diabetes management.  Exercise, and detailed carbohydrates information provided  -  detailed on discharge instructions.  5) Chronic Care/Health Maintenance:  -she  is on ACEI/ARB and Statin medications and  is encouraged to initiate and continue to follow up with Ophthalmology, Dentist,  Podiatrist at least yearly or according to recommendations, and advised  to   stay away from smoking. I have recommended yearly flu vaccine and pneumonia vaccine at least every 5 years; moderate intensity exercise for up to 150 minutes weekly; and  sleep for at least 7 hours a day.  - she is  advised to maintain close follow up with Benetta Spar, MD for primary care needs, as well as her other providers for optimal and coordinated care.  I spent 26 minutes in the care of the patient today including review of labs from CMP, Lipids, Thyroid Function, Hematology (current and previous including abstractions from other facilities); face-to-face time discussing  her blood glucose readings/logs, discussing hypoglycemia and hyperglycemia episodes and symptoms, medications doses, her options of short and long term treatment based on the latest standards of care / guidelines;  discussion about incorporating lifestyle medicine;  and documenting the encounter. Risk reduction counseling performed per USPSTF guidelines to reduce  obesity and cardiovascular  risk factors.     Please refer to Patient Instructions for Blood Glucose Monitoring and Insulin/Medications Dosing Guide"  in media tab for additional information. Please  also refer to " Patient Self Inventory" in the Media  tab for reviewed elements of pertinent patient history.  Megan Mcknight participated in the discussions, expressed understanding, and voiced agreement with the above plans.  All questions were answered to her satisfaction. she is encouraged to contact clinic should she have any questions or concerns prior to her return visit.   Follow up plan: - Return in about 4 months (around 03/30/2023) for F/U with Pre-visit Labs, A1c -NV.  Marquis Lunch, MD [facility] [address] [place], [place] [address] Phone: [phone]  Fax: [phone]    11/28/2022, 12:49 PM  This note was partially dictated with voice recognition software. Similar sounding words can be transcribed inadequately or may not  be corrected upon review.

## 2022-11-28 NOTE — Patient Instructions (Signed)

## 2022-11-30 ENCOUNTER — Other Ambulatory Visit: Payer: Self-pay

## 2023-02-05 DIAGNOSIS — L821 Other seborrheic keratosis: Secondary | ICD-10-CM | POA: Diagnosis not present

## 2023-02-05 DIAGNOSIS — L818 Other specified disorders of pigmentation: Secondary | ICD-10-CM | POA: Diagnosis not present

## 2023-02-05 DIAGNOSIS — L820 Inflamed seborrheic keratosis: Secondary | ICD-10-CM | POA: Diagnosis not present

## 2023-02-05 DIAGNOSIS — L700 Acne vulgaris: Secondary | ICD-10-CM | POA: Diagnosis not present

## 2023-02-09 DIAGNOSIS — B351 Tinea unguium: Secondary | ICD-10-CM | POA: Diagnosis not present

## 2023-02-09 DIAGNOSIS — L851 Acquired keratosis [keratoderma] palmaris et plantaris: Secondary | ICD-10-CM | POA: Diagnosis not present

## 2023-02-09 DIAGNOSIS — M79671 Pain in right foot: Secondary | ICD-10-CM | POA: Diagnosis not present

## 2023-02-09 DIAGNOSIS — E1142 Type 2 diabetes mellitus with diabetic polyneuropathy: Secondary | ICD-10-CM | POA: Diagnosis not present

## 2023-03-23 DIAGNOSIS — Z9484 Stem cells transplant status: Secondary | ICD-10-CM | POA: Diagnosis not present

## 2023-03-23 DIAGNOSIS — Z95828 Presence of other vascular implants and grafts: Secondary | ICD-10-CM | POA: Diagnosis not present

## 2023-03-23 DIAGNOSIS — C8460 Anaplastic large cell lymphoma, ALK-positive, unspecified site: Secondary | ICD-10-CM | POA: Diagnosis not present

## 2023-03-26 ENCOUNTER — Other Ambulatory Visit (HOSPITAL_COMMUNITY)
Admission: RE | Admit: 2023-03-26 | Discharge: 2023-03-26 | Disposition: A | Discharge status: Home / Self Care | Payer: 59 | Source: Ambulatory Visit | Attending: Hematology and Oncology | Admitting: Hematology and Oncology

## 2023-03-26 ENCOUNTER — Encounter: Payer: Self-pay | Admitting: Oncology

## 2023-03-26 DIAGNOSIS — C8590 Non-Hodgkin lymphoma, unspecified, unspecified site: Secondary | ICD-10-CM | POA: Insufficient documentation

## 2023-03-26 LAB — CBC WITH DIFFERENTIAL/PLATELET
Abs Immature Granulocytes: 0.01 10*3/uL (ref 0.00–0.07)
Basophils Absolute: 0 10*3/uL (ref 0.0–0.1)
Basophils Relative: 0 %
Eosinophils Absolute: 0.1 10*3/uL (ref 0.0–0.5)
Eosinophils Relative: 1 %
HCT: 39.7 % (ref 36.0–46.0)
Hemoglobin: 12.6 g/dL (ref 12.0–15.0)
Immature Granulocytes: 0 %
Lymphocytes Relative: 50 %
Lymphs Abs: 2.6 10*3/uL (ref 0.7–4.0)
MCH: 27.3 pg (ref 26.0–34.0)
MCHC: 31.7 g/dL (ref 30.0–36.0)
MCV: 85.9 fL (ref 80.0–100.0)
Monocytes Absolute: 0.4 10*3/uL (ref 0.1–1.0)
Monocytes Relative: 9 %
Neutro Abs: 2 10*3/uL (ref 1.7–7.7)
Neutrophils Relative %: 40 %
Platelets: 141 10*3/uL — ABNORMAL LOW (ref 150–400)
RBC: 4.62 MIL/uL (ref 3.87–5.11)
RDW: 14 % (ref 11.5–15.5)
WBC: 5.1 10*3/uL (ref 4.0–10.5)
nRBC: 0 % (ref 0.0–0.2)

## 2023-03-26 LAB — COMPREHENSIVE METABOLIC PANEL
ALT: 27 U/L (ref 0–44)
AST: 21 U/L (ref 15–41)
Albumin: 4 g/dL (ref 3.5–5.0)
Alkaline Phosphatase: 59 U/L (ref 38–126)
Anion gap: 8 (ref 5–15)
BUN: 15 mg/dL (ref 8–23)
CO2: 27 mmol/L (ref 22–32)
Calcium: 9.2 mg/dL (ref 8.9–10.3)
Chloride: 105 mmol/L (ref 98–111)
Creatinine, Ser: 0.96 mg/dL (ref 0.44–1.00)
GFR, Estimated: >60 mL/min (ref >60)
Glucose, Bld: 181 mg/dL — ABNORMAL HIGH (ref 70–99)
Potassium: 4 mmol/L (ref 3.5–5.1)
Sodium: 140 mmol/L (ref 135–145)
Total Bilirubin: 1 mg/dL (ref 0.3–1.2)
Total Protein: 6.7 g/dL (ref 6.5–8.1)

## 2023-03-26 LAB — LACTATE DEHYDROGENASE: LDH: 126 U/L (ref 98–192)

## 2023-03-26 LAB — T4, FREE: Free T4: 1.11 ng/dL (ref 0.82–1.77)

## 2023-03-26 LAB — TSH: TSH: 3.47 u[IU]/mL (ref 0.450–4.500)

## 2023-03-26 LAB — THYROGLOBULIN ANTIBODY: Thyroglobulin Antibody: <1 IU/mL (ref 0.0–0.9)

## 2023-03-26 LAB — THYROID PEROXIDASE ANTIBODY: Thyroperoxidase Ab SerPl-aCnc: 9 IU/mL (ref 0–34)

## 2023-04-01 ENCOUNTER — Encounter: Payer: Self-pay | Admitting: "Endocrinology

## 2023-04-01 ENCOUNTER — Ambulatory Visit (INDEPENDENT_AMBULATORY_CARE_PROVIDER_SITE_OTHER): Payer: 59 | Admitting: "Endocrinology

## 2023-04-01 ENCOUNTER — Encounter: Payer: Self-pay | Admitting: Oncology

## 2023-04-01 VITALS — BP 128/74 | HR 64 | Ht 68.0 in | Wt 270.0 lb

## 2023-04-01 DIAGNOSIS — E782 Mixed hyperlipidemia: Secondary | ICD-10-CM

## 2023-04-01 DIAGNOSIS — I10 Essential (primary) hypertension: Secondary | ICD-10-CM

## 2023-04-01 DIAGNOSIS — E1165 Type 2 diabetes mellitus with hyperglycemia: Secondary | ICD-10-CM

## 2023-04-01 DIAGNOSIS — E038 Other specified hypothyroidism: Secondary | ICD-10-CM

## 2023-04-01 DIAGNOSIS — Z6839 Body mass index (BMI) 39.0-39.9, adult: Secondary | ICD-10-CM

## 2023-04-01 LAB — POCT GLYCOSYLATED HEMOGLOBIN (HGB A1C): HbA1c, POC (controlled diabetic range): 7.9 % — AB (ref 0.0–7.0)

## 2023-04-01 MED ORDER — METFORMIN HCL 500 MG PO TABS: 1000.0000 mg | ORAL_TABLET | Freq: Two times a day (BID) | ORAL | 1 refills | Status: DC

## 2023-04-01 MED ORDER — ACCU-CHEK SOFTCLIX LANCETS MISC: 12 refills | Status: AC

## 2023-04-01 NOTE — Progress Notes (Signed)
04/01/2023, 2:24 PM  Endocrinology follow-up note   Subjective:    Patient ID: Megan Mcknight, female    DOB: [birth year].  Megan Mcknight is being seen in follow-up after she was seen in follow-up after she was seen in  consultation for management of currently uncontrolled symptomatic diabetes requested by  Benetta Spar, MD.   Past Medical History:  Diagnosis Date   Acute bronchitis    Anaplastic large cell lymphoma 2014   remission since 2015   Asthma    Cancer    lung and kidney   Colon polyps 02/2013   Per colonoscopy   Coma    Diabetes mellitus without complication    Diverticulosis 02/2013   Per colonoscopy   Dysrhythmia    Former light tobacco smoker    GERD (gastroesophageal reflux disease)    H/O stem cell transplant 2015   Hyperlipidemia    Nerve pain    Left leg   Non Hodgkin's lymphoma    Seasonal allergies     Past Surgical History:  Procedure Laterality Date   bones removed from Baby toes     BRONCHIAL BRUSHINGS  04/25/2020   Procedure: BRONCHIAL BRUSHINGS;  Surgeon: Oretha Milch, MD;  Location: [facility] ENDOSCOPY;  Service: Cardiopulmonary;;   BRONCHIAL WASHINGS  04/25/2020   Procedure: BRONCHIAL WASHINGS;  Surgeon: Oretha Milch, MD;  Location: [facility] ENDOSCOPY;  Service: Cardiopulmonary;;   COLONOSCOPY  02/16/2012   Procedure: COLONOSCOPY;  Surgeon: Corbin Ade, MD;  Location: [facility] ENDO SUITE;  Service: Endoscopy;  Laterality: N/A;  1:30   COLONOSCOPY N/A 03/26/2017   Procedure: COLONOSCOPY;  Surgeon: Corbin Ade, MD;  Location: [facility] ENDO SUITE;  Service: Endoscopy;  Laterality: N/A;  930   COLONOSCOPY WITH PROPOFOL N/A 08/07/2022   Procedure: COLONOSCOPY WITH PROPOFOL;  Surgeon: Corbin Ade, MD;  Location: [facility] ENDO SUITE;  Service: Endoscopy;  Laterality: N/A;  2:00PM   IR IMAGING GUIDED PORT INSERTION  12/19/2019   LUNG BIOPSY  04/25/2020   Procedure: LUNG BIOPSY;  Surgeon: Oretha Milch, MD;  Location: [facility] ENDOSCOPY;  Service: Cardiopulmonary;;   MEDIASTINOSCOPY N/A 05/13/2013   Procedure: MEDIASTINOSCOPY;  Surgeon: Loreli Slot, MD;  Location: [facility] OR;  Service: Thoracic;  Laterality: N/A;   POLYPECTOMY  08/07/2022   Procedure: POLYPECTOMY;  Surgeon: Corbin Ade, MD;  Location: [facility] ENDO SUITE;  Service: Endoscopy;;   VAGINAL HYSTERECTOMY     vaginal hyst (ovaries insitu)   VIDEO BRONCHOSCOPY N/A 04/25/2020   Procedure: VIDEO BRONCHOSCOPY WITH FLUORO;  Surgeon: Oretha Milch, MD;  Location: [facility] ENDOSCOPY;  Service: Cardiopulmonary;  Laterality: N/A;   VIDEO BRONCHOSCOPY WITH ENDOBRONCHIAL ULTRASOUND N/A 05/13/2013   Procedure: VIDEO BRONCHOSCOPY WITH ENDOBRONCHIAL ULTRASOUND;  Surgeon: Loreli Slot, MD;  Location: [facility] OR;  Service: Thoracic;  Laterality: N/A;    Social History   Socioeconomic History   Marital status: Divorced    Spouse name: Not on file   Number of children: 3   Years of education: Not on file   Highest education level: Not on file  Occupational History   Occupation: Pensions consultant     Employer: [employer]  Tobacco Use   Smoking status: Former    Packs/day: .5  Types: Cigarettes    Quit date: 04/19/2013    Years since quitting: 9.9   Smokeless tobacco: Never  Vaping Use   Vaping Use: Never used  Substance and Sexual Activity   Alcohol use: Yes    Comment: occassionally   Drug use: No   Sexual activity: Not Currently    Birth control/protection: Surgical  Other Topics Concern   Not on file  Social History Narrative   Not on file   Social Determinants of Health   Financial Resource Strain: Low Risk  (08/21/2022)   Overall Financial Resource Strain (CARDIA)    Difficulty of Paying Living Expenses: Not hard at all  Food Insecurity: No Food Insecurity (08/21/2022)   Hunger Vital Sign    Worried About Running Out of Food in the Last Year: Never true    Ran Out of Food in the Last Year: Never true  Transportation  Needs: No Transportation Needs (08/21/2022)   PRAPARE - Administrator, Civil Service (Medical): No    Lack of Transportation (Non-Medical): No  Physical Activity: Insufficiently Active (08/21/2022)   Exercise Vital Sign    Days of Exercise per Week: 5 days    Minutes of Exercise per Session: 20 min  Stress: No Stress Concern Present (08/21/2022)   [employer] of Occupational Health - Occupational Stress Questionnaire    Feeling of Stress : Not at all  Social Connections: Moderately Integrated (08/21/2022)   Social Connection and Isolation Panel [NHANES]    Frequency of Communication with Friends and Family: More than three times a week    Frequency of Social Gatherings with Friends and Family: Twice a week    Attends Religious Services: More than 4 times per year    Active Member of [employer] or Organizations: Yes    Attends Banker Meetings: 1 to 4 times per year    Marital Status: Divorced    Family History  Problem Relation Age of Onset   Colon polyps Mother    Hypertension Mother    Heart disease Mother    Hyperlipidemia Mother    Cancer Father        prostate   Hypertension Father    Hyperlipidemia Father    Heart defect Other        family history    Asthma Other        family history    Colon cancer Neg Hx     Outpatient Encounter Medications as of 04/01/2023  Medication Sig   Accu-Chek Softclix Lancets lancets Use to check glucose 2 x daily as instructed   albuterol (PROVENTIL HFA;VENTOLIN HFA) 108 (90 Base) MCG/ACT inhaler Inhale 2 puffs into the lungs every 4 (four) hours as needed for wheezing or shortness of breath.   Ascorbic Acid (VITAMIN C PO) Take 1 tablet by mouth in the morning.   Cholecalciferol (VITAMIN D3 PO) Take 1 tablet by mouth in the morning.   furosemide (LASIX) 20 MG tablet Take 20 mg by mouth in the morning.   gabapentin (NEURONTIN) 100 MG capsule Take 100 mg by mouth in the morning.   latanoprost (XALATAN) 0.005 %  ophthalmic solution Place 1 drop into both eyes at bedtime.   metFORMIN (GLUCOPHAGE) 500 MG tablet Take 2 tablets (1,000 mg total) by mouth 2 (two) times daily with a meal.   metoprolol tartrate (LOPRESSOR) 25 MG tablet Take 25 mg by mouth 2 (two) times daily.   montelukast (SINGULAIR) 10 MG tablet Take 10 mg by  mouth in the morning.   Na Sulfate-K Sulfate-Mg Sulf 17.5-3.13-1.6 GM/177ML SOLN As directed (Patient not taking: Reported on 08/21/2022)   naproxen sodium (ALEVE) 220 MG tablet Take 220-440 mg by mouth 2 (two) times daily as needed (back pain.).   potassium chloride SA (KLOR-CON M) 20 MEQ tablet Take 20 mEq by mouth in the morning.   rosuvastatin (CRESTOR) 10 MG tablet Take 10 mg by mouth in the morning.   TRELEGY ELLIPTA 200-62.5-25 MCG/ACT AEPB Take 1 puff by mouth in the morning.   [DISCONTINUED] metFORMIN (GLUCOPHAGE) 500 MG tablet Take 1 tablet (500 mg total) by mouth 2 (two) times daily with a meal.   No facility-administered encounter medications on file as of 04/01/2023.    ALLERGIES: Allergies  Allergen Reactions   Iodine Anaphylaxis    Told to avoid due to shellfish allergy. Told to avoid due to shellfish allergy.   Shellfish-Derived Products Anaphylaxis and Swelling    Eyes swelling, scratchy throat, bumps on lips.    VACCINATION STATUS: Immunization History  Administered Date(s) Administered   DTaP / Hep B / IPV 10/04/2014   HIB (PRP-T) 10/04/2014   Influenza,inj,Quad PF,6+ Mos 11/04/2013, 10/04/2014   Influenza-Unspecified 09/14/2018   Moderna Sars-Covid-2 Vaccination 03/08/2020, 04/10/2020   Pneumococcal Conjugate-13 10/04/2014    Diabetes She presents for her follow-up diabetic visit. She has type 2 diabetes mellitus. Onset time: She was diagnosed at approximate age of 50 years. Her disease course has been worsening. There are no hypoglycemic associated symptoms. Pertinent negatives for hypoglycemia include no confusion, headaches, pallor or seizures.  Pertinent negatives for diabetes include no chest pain, no fatigue, no polydipsia, no polyphagia and no polyuria. There are no hypoglycemic complications. Symptoms are worsening. There are no diabetic complications. Risk factors for coronary artery disease include diabetes mellitus, dyslipidemia, family history, obesity, hypertension, tobacco exposure, sedentary lifestyle and post-menopausal. Current diabetic treatment includes insulin injections (She is currently on Lantus 60 units nightly, Metformin 1000 mg p.o. twice daily.). Her weight is fluctuating minimally. She is following a generally unhealthy diet. When asked about meal planning, she reported none. She has not had a previous visit with a dietitian. She rarely participates in exercise. Her home blood glucose trend is increasing steadily. Her overall blood glucose range is 180-200 mg/dl. (She presents with no meter nor logs.  Her point-of-care A1c is 7.9% increasing from 7.2%.  She did have A1c as high as 14% a year ago.  She remains on metformin 500 mg p.o. twice daily.   ) An ACE inhibitor/angiotensin II receptor blocker is being taken. Eye exam is current.  Hyperlipidemia This is a chronic problem. The current episode started more than 1 year ago. Exacerbating diseases include diabetes. Pertinent negatives include no chest pain, myalgias or shortness of breath. Current antihyperlipidemic treatment includes statins. Risk factors for coronary artery disease include dyslipidemia, diabetes mellitus, hypertension, obesity, a sedentary lifestyle, post-menopausal and family history.  Hypertension This is a chronic problem. The current episode started more than 1 year ago. Pertinent negatives include no chest pain, headaches, palpitations or shortness of breath. Risk factors for coronary artery disease include diabetes mellitus, dyslipidemia, family history, obesity, post-menopausal state, smoking/tobacco exposure and sedentary lifestyle. Past treatments  include angiotensin blockers.    Review of Systems  Constitutional:  Negative for chills, fatigue, fever and unexpected weight change.  HENT:  Negative for trouble swallowing and voice change.   Eyes:  Negative for visual disturbance.  Respiratory:  Negative for cough, shortness of breath and  wheezing.   Cardiovascular:  Negative for chest pain, palpitations and leg swelling.  Gastrointestinal:  Negative for diarrhea, nausea and vomiting.  Endocrine: Negative for cold intolerance, heat intolerance, polydipsia, polyphagia and polyuria.  Musculoskeletal:  Negative for arthralgias and myalgias.  Skin:  Negative for color change, pallor, rash and wound.  Neurological:  Negative for seizures and headaches.  Psychiatric/Behavioral:  Negative for confusion and suicidal ideas.     Objective:       04/01/2023    8:41 AM 11/28/2022    9:39 AM 08/21/2022    9:09 AM  Vitals with BMI  Height 5\' 8"  5\' 8"  5\' 8"   Weight 270 lbs 272 lbs 3 oz 265 lbs  BMI 41.06 41.4 40.3  Systolic 128 120 161  Diastolic 74 74 80  Pulse 64 56 71    BP 128/74   Pulse 64   Ht 5\' 8"  (1.727 m)   Wt 270 lb (122.5 kg)   BMI 41.05 kg/m   Wt Readings from Last 3 Encounters:  04/01/23 270 lb (122.5 kg)  11/28/22 272 lb 3.2 oz (123.5 kg)  08/21/22 265 lb (120.2 kg)      CMP ( most recent) CMP     Component Value Date/Time   NA 140 03/26/2023 1101   NA 143 11/19/2022 1041   NA 144 09/04/2017 0916   K 4.0 03/26/2023 1101   K 4.1 09/04/2017 0916   CL 105 03/26/2023 1101   CO2 27 03/26/2023 1101   CO2 26 09/04/2017 0916   GLUCOSE 181 (H) 03/26/2023 1101   GLUCOSE 197 (H) 09/04/2017 0916   BUN 15 03/26/2023 1101   BUN 13 11/19/2022 1041   BUN 15.6 09/04/2017 0916   CREATININE 0.96 03/26/2023 1101   CREATININE 0.84 04/05/2020 0900   CREATININE 1.0 09/04/2017 0916   CALCIUM 9.2 03/26/2023 1101   CALCIUM 9.5 09/04/2017 0916   PROT 6.7 03/26/2023 1101   PROT 6.6 11/19/2022 1041   PROT 6.8 09/04/2017 0916    ALBUMIN 4.0 03/26/2023 1101   ALBUMIN 4.1 11/19/2022 1041   ALBUMIN 3.8 09/04/2017 0916   AST 21 03/26/2023 1101   AST 18 04/05/2020 0900   AST 19 09/04/2017 0916   ALT 27 03/26/2023 1101   ALT 14 04/05/2020 0900   ALT 18 09/04/2017 0916   ALKPHOS 59 03/26/2023 1101   ALKPHOS 49 09/04/2017 0916   BILITOT 1.0 03/26/2023 1101   BILITOT 0.3 11/19/2022 1041   BILITOT 0.3 04/05/2020 0900   BILITOT 0.49 09/04/2017 0916   GFRNONAA >60 03/26/2023 1101   GFRNONAA >60 04/05/2020 0900   GFRAA 66 11/05/2020 0927   GFRAA >60 04/05/2020 0900    Diabetic Labs (most recent): Lab Results  Component Value Date   HGBA1C 7.9 (A) 04/01/2023   HGBA1C 7.2 (A) 11/28/2022   HGBA1C 5.9 05/29/2022    Lab Results  Component Value Date   TSH 3.470 03/24/2023   TSH 4.930 (H) 11/19/2022   TSH 3.210 11/22/2021   TSH 3.770 11/05/2020   TSH 2.859 05/06/2013   FREET4 1.11 03/24/2023   FREET4 0.94 11/19/2022   FREET4 0.97 11/22/2021   FREET4 0.79 (L) 11/05/2020   FREET4 0.93 05/06/2013    Lipid Panel     Component Value Date/Time   CHOL 154 11/19/2022 1041   TRIG 161 (H) 11/19/2022 1041   HDL 50 11/19/2022 1041   CHOLHDL 3.1 11/19/2022 1041   LDLCALC 76 11/19/2022 1041   LABVLDL 28 11/19/2022 1041  Assessment & Plan:   1. Type 2 diabetes mellitus with hyperglycemia, without long-term current use of insulin (HCC)  - Megan Mcknight has currently uncontrolled symptomatic type 2 DM since  62 years of age.  She presents with no meter nor logs.  Her point-of-care A1c is 7.9% increasing from 7.2%.  She did have A1c as high as 14% a year ago.  She remains on metformin 500 mg p.o. twice daily.    -- Recent labs reviewed.   - I had a long discussion with her about the progressive nature of diabetes and the pathology behind its complications. -her diabetes is complicated by obesity/sedentary life and she remains at a high risk for more acute and chronic complications which include CAD, CVA, CKD,  retinopathy, and neuropathy. These are all discussed in detail with her. - she acknowledges that there is a room for improvement in her food and drink choices. - Suggestion is made for her to avoid simple carbohydrates  from her diet including Cakes, Sweet Desserts, Ice Cream, Soda (diet and regular), Sweet Tea, Candies, Chips, Cookies, Store Bought Juices, Alcohol , Artificial Sweeteners,  Coffee Creamer, and "Sugar-free" Products, Lemonade. This will help patient to have more stable blood glucose profile and potentially avoid unintended weight gain.  The following Lifestyle Medicine recommendations according to American College of Lifestyle Medicine  [facility]) were discussed and and offered to patient and she  agrees to start the journey:  A. Whole Foods, Plant-Based Nutrition comprising of fruits and vegetables, plant-based proteins, whole-grain carbohydrates was discussed in detail with the patient.   A list for source of those nutrients were also provided to the patient.  Patient will use only water or unsweetened tea for hydration. B.  The need to stay away from risky substances including alcohol, smoking; obtaining 7 to 9 hours of restorative sleep, at least 150 minutes of moderate intensity exercise weekly, the importance of healthy social connections,  and stress management techniques were discussed. C.  A full color page of  Calorie density of various food groups per pound showing examples of each food groups was provided to the patient.   -  she is advised to stick to a routine mealtimes to eat 3 meals  a day and avoid unnecessary snacks ( to snack only to correct hypoglycemia).    - she will be scheduled with Norm Salt, RDN, CDE for diabetes education.  - I have approached her with the following individualized plan to manage  her diabetes and patient agrees:   -Based on her presentation with increased and above target glycemic profile, she is advised to increase metformin to 1000 mg  p.o. twice daily.  -She is advised and willing to monitor blood glucose twice daily-daily before breakfast and at bedtime. -She is encouraged to call clinic for hypoglycemia below 70 or hyperglycemia above 300 mg per DL.  - she will be considered for incretin therapy or SGLT2 inhibitors as appropriate next visit.  - Specific targets for  A1c;  LDL, HDL,  and Triglycerides were discussed with the patient.  2) Blood Pressure /Hypertension:  -Her blood pressure is controlled to target. -  she is advised to continue her current medications including losartan 25 mg p.o. daily with breakfast .  3) Lipids/Hyperlipidemia: Review of her recent lipid panel showed controlled LDL at 76.  She will continue to benefit from Crestor 10 mg p.o. daily at bedtime.   Whole food plant-based diet will help her control dyslipidemia along with  her statin.     Side effects and precautions discussed with her.  4)  Weight/Diet:  Body mass index is 41.05 kg/m.  -   clearly complicating her diabetes care.   she is  a candidate for weight loss. I discussed with her the fact that loss of 5 - 10% of her  current body weight will have the most impact on her diabetes management.  Exercise, and detailed carbohydrates information provided  -  detailed on discharge instructions.  5) Chronic Care/Health Maintenance:  -she  is on ACEI/ARB and Statin medications and  is encouraged to initiate and continue to follow up with Ophthalmology, Dentist,  Podiatrist at least yearly or according to recommendations, and advised to   stay away from smoking. I have recommended yearly flu vaccine and pneumonia vaccine at least every 5 years; moderate intensity exercise for up to 150 minutes weekly; and  sleep for at least 7 hours a day.  - she is  advised to maintain close follow up with Benetta Spar, MD for primary care needs, as well as her other providers for optimal and coordinated care.   I spent  28  minutes in the care of the  patient today including review of labs from CMP, Lipids, Thyroid Function, Hematology (current and previous including abstractions from other facilities); face-to-face time discussing  her blood glucose readings/logs, discussing hypoglycemia and hyperglycemia episodes and symptoms, medications doses, her options of short and long term treatment based on the latest standards of care / guidelines;  discussion about incorporating lifestyle medicine;  and documenting the encounter. Risk reduction counseling performed per USPSTF guidelines to reduce  obesity and cardiovascular risk factors.     Please refer to Patient Instructions for Blood Glucose Monitoring and Insulin/Medications Dosing Guide"  in media tab for additional information. Please  also refer to " Patient Self Inventory" in the Media  tab for reviewed elements of pertinent patient history.  Megan Mcknight participated in the discussions, expressed understanding, and voiced agreement with the above plans.  All questions were answered to her satisfaction. she is encouraged to contact clinic should she have any questions or concerns prior to her return visit.    Follow up plan: - Return in about 4 months (around 08/01/2023) for Bring Meter/CGM Device/Logs- A1c in Office.  Marquis Lunch, MD [facility] [address] [place], [place] [address] Phone: [phone]  Fax: [phone]    04/01/2023, 2:24 PM  This note was partially dictated with voice recognition software. Similar sounding words can be transcribed inadequately or may not  be corrected upon review.

## 2023-04-01 NOTE — Patient Instructions (Signed)
                                     Advice for Weight Management  -For most of us the best way to lose weight is by diet management. Generally speaking, diet management means consuming less calories intentionally which over time brings about progressive weight loss.  This can be achieved more effectively by avoiding ultra processed carbohydrates, processed meats, unhealthy fats.    It is critically important to know your numbers: how much calorie you are consuming and how much calorie you need. More importantly, our carbohydrates sources should be unprocessed naturally occurring  complex starch food items.  It is always important to balance nutrition also by  appropriate intake of proteins (mainly plant-based), healthy fats/oils, plenty of fruits and vegetables.   -The American College of Lifestyle Medicine (ACL M) recommends nutrition derived mostly from Whole Food, Plant Predominant Sources example an apple instead of applesauce or apple pie. Eat Plenty of vegetables, Mushrooms, fruits, Legumes, Whole Grains, Nuts, seeds in lieu of processed meats, processed snacks/pastries red meat, poultry, eggs.  Use only water or unsweetened tea for hydration.  The College also recommends the need to stay away from risky substances including alcohol, smoking; obtaining 7-9 hours of restorative sleep, at least 150 minutes of moderate intensity exercise weekly, importance of healthy social connections, and being mindful of stress and seek help when it is overwhelming.    -Sticking to a routine mealtime to eat 3 meals a day and avoiding unnecessary snacks is shown to have a big role in weight control. Under normal circumstances, the only time we burn stored energy is when we are hungry, so allow  some hunger to take place- hunger means no food between appropriate meal times, only water.  It is not advisable to starve.   -It is better to avoid simple carbohydrates including:  Cakes, Sweet Desserts, Ice Cream, Soda (diet and regular), Sweet Tea, Candies, Chips, Cookies, Store Bought Juices, Alcohol in Excess of  1-2 drinks a day, Lemonade,  Artificial Sweeteners, Doughnuts, Coffee Creamers, "Sugar-free" Products, etc, etc.  This is not a complete list.....    -Consulting with certified diabetes educators is proven to provide you with the most accurate and current information on diet.  Also, you may be  interested in discussing diet options/exchanges , we can schedule a visit with Megan Mcknight, RDN, CDE for individualized nutrition education.  -Exercise: If you are able: 30 -60 minutes a day ,4 days a week, or 150 minutes of moderate intensity exercise weekly.    The longer the better if tolerated.  Combine stretch, strength, and aerobic activities.  If you were told in the past that you have high risk for cardiovascular diseases, or if you are currently symptomatic, you may seek evaluation by your heart doctor prior to initiating moderate to intense exercise programs.                                  Additional Care Considerations for Diabetes/Prediabetes   -Diabetes  is a chronic disease.  The most important care consideration is regular follow-up with your diabetes care provider with the goal being avoiding or delaying its complications and to take advantage of advances in medications and technology.  If appropriate actions are taken early enough, type 2 diabetes can even be   reversed.  Seek information from the right source.  - Whole Food, Plant Predominant Nutrition is highly recommended: Eat Plenty of vegetables, Mushrooms, fruits, Legumes, Whole Grains, Nuts, seeds in lieu of processed meats, processed snacks/pastries red meat, poultry, eggs as recommended by American College of  Lifestyle Medicine (ACLM).  -Type 2 diabetes is known to coexist with other important comorbidities such as high blood pressure and high cholesterol.  It is critical to control not only the  diabetes but also the high blood pressure and high cholesterol to minimize and delay the risk of complications including coronary artery disease, stroke, amputations, blindness, etc.  The good news is that this diet recommendation for type 2 diabetes is also very helpful for managing high cholesterol and high blood blood pressure.  - Studies showed that people with diabetes will benefit from a class of medications known as ACE inhibitors and statins.  Unless there are specific reasons not to be on these medications, the standard of care is to consider getting one from these groups of medications at an optimal doses.  These medications are generally considered safe and proven to help protect the heart and the kidneys.    - People with diabetes are encouraged to initiate and maintain regular follow-up with eye doctors, foot doctors, dentists , and if necessary heart and kidney doctors.     - It is highly recommended that people with diabetes quit smoking or stay away from smoking, and get yearly  flu vaccine and pneumonia vaccine at least every 5 years.  See above for additional recommendations on exercise, sleep, stress management , and healthy social connections.      

## 2023-04-03 ENCOUNTER — Other Ambulatory Visit: Payer: Self-pay

## 2023-07-21 ENCOUNTER — Other Ambulatory Visit (HOSPITAL_COMMUNITY): Payer: Self-pay | Admitting: Internal Medicine

## 2023-07-21 DIAGNOSIS — Z1231 Encounter for screening mammogram for malignant neoplasm of breast: Secondary | ICD-10-CM

## 2023-07-29 ENCOUNTER — Encounter (HOSPITAL_COMMUNITY): Payer: Self-pay

## 2023-07-29 ENCOUNTER — Ambulatory Visit (HOSPITAL_COMMUNITY)
Admission: RE | Admit: 2023-07-29 | Discharge: 2023-07-29 | Disposition: A | Discharge status: Home / Self Care | Payer: 59 | Source: Ambulatory Visit | Attending: Internal Medicine | Admitting: Internal Medicine

## 2023-07-29 DIAGNOSIS — Z1231 Encounter for screening mammogram for malignant neoplasm of breast: Secondary | ICD-10-CM | POA: Diagnosis not present

## 2023-08-03 ENCOUNTER — Encounter: Payer: Self-pay | Admitting: "Endocrinology

## 2023-08-03 ENCOUNTER — Ambulatory Visit (INDEPENDENT_AMBULATORY_CARE_PROVIDER_SITE_OTHER): Payer: 59 | Admitting: "Endocrinology

## 2023-08-03 VITALS — BP 120/82 | HR 68 | Ht 68.0 in | Wt 264.4 lb

## 2023-08-03 DIAGNOSIS — I10 Essential (primary) hypertension: Secondary | ICD-10-CM

## 2023-08-03 DIAGNOSIS — Z7984 Long term (current) use of oral hypoglycemic drugs: Secondary | ICD-10-CM

## 2023-08-03 DIAGNOSIS — Z6839 Body mass index (BMI) 39.0-39.9, adult: Secondary | ICD-10-CM | POA: Diagnosis not present

## 2023-08-03 DIAGNOSIS — E1165 Type 2 diabetes mellitus with hyperglycemia: Secondary | ICD-10-CM | POA: Diagnosis not present

## 2023-08-03 DIAGNOSIS — E782 Mixed hyperlipidemia: Secondary | ICD-10-CM

## 2023-08-03 LAB — POCT GLYCOSYLATED HEMOGLOBIN (HGB A1C): HbA1c, POC (controlled diabetic range): 6.9 % (ref 0.0–7.0)

## 2023-08-03 MED ORDER — EMPAGLIFLOZIN 10 MG PO TABS
10.0000 mg | ORAL_TABLET | Freq: Every day | ORAL | 1 refills | Status: DC
Start: 2023-08-03 — End: 2023-11-17

## 2023-08-03 NOTE — Progress Notes (Signed)
08/03/2023, 2:41 PM  Endocrinology follow-up note   Subjective:    Patient ID: Megan Mcknight, female    DOB: [birth year].  Megan Mcknight is being seen in follow-up after she was seen in follow-up after she was seen in  consultation for management of currently uncontrolled symptomatic diabetes requested by  Benetta Spar, MD.   Past Medical History:  Diagnosis Date   Acute bronchitis    Anaplastic large cell lymphoma 2014   remission since 2015   Asthma    Cancer (HCC)    lung and kidney   Colon polyps 02/2013   Per colonoscopy   Coma (HCC)    Diabetes mellitus without complication (HCC)    Diverticulosis 02/2013   Per colonoscopy   Dysrhythmia    Former light tobacco smoker    GERD (gastroesophageal reflux disease)    H/O stem cell transplant (HCC) 2015   Hyperlipidemia    Nerve pain    Left leg   Non Hodgkin's lymphoma (HCC)    Seasonal allergies     Past Surgical History:  Procedure Laterality Date   bones removed from Baby toes     BRONCHIAL BRUSHINGS  04/25/2020   Procedure: BRONCHIAL BRUSHINGS;  Surgeon: Oretha Milch, MD;  Location: [facility] ENDOSCOPY;  Service: Cardiopulmonary;;   BRONCHIAL WASHINGS  04/25/2020   Procedure: BRONCHIAL WASHINGS;  Surgeon: Oretha Milch, MD;  Location: [facility] ENDOSCOPY;  Service: Cardiopulmonary;;   COLONOSCOPY  02/16/2012   Procedure: COLONOSCOPY;  Surgeon: Corbin Ade, MD;  Location: [facility] ENDO SUITE;  Service: Endoscopy;  Laterality: N/A;  1:30   COLONOSCOPY N/A 03/26/2017   Procedure: COLONOSCOPY;  Surgeon: Corbin Ade, MD;  Location: [facility] ENDO SUITE;  Service: Endoscopy;  Laterality: N/A;  930   COLONOSCOPY WITH PROPOFOL N/A 08/07/2022   Procedure: COLONOSCOPY WITH PROPOFOL;  Surgeon: Corbin Ade, MD;  Location: [facility] ENDO SUITE;  Service: Endoscopy;  Laterality: N/A;  2:00PM   IR IMAGING GUIDED PORT INSERTION  12/19/2019   LUNG BIOPSY  04/25/2020   Procedure:  LUNG BIOPSY;  Surgeon: Oretha Milch, MD;  Location: [facility] ENDOSCOPY;  Service: Cardiopulmonary;;   MEDIASTINOSCOPY N/A 05/13/2013   Procedure: MEDIASTINOSCOPY;  Surgeon: Loreli Slot, MD;  Location: [facility] OR;  Service: Thoracic;  Laterality: N/A;   POLYPECTOMY  08/07/2022   Procedure: POLYPECTOMY;  Surgeon: Corbin Ade, MD;  Location: [facility] ENDO SUITE;  Service: Endoscopy;;   VAGINAL HYSTERECTOMY     vaginal hyst (ovaries insitu)   VIDEO BRONCHOSCOPY N/A 04/25/2020   Procedure: VIDEO BRONCHOSCOPY WITH FLUORO;  Surgeon: Oretha Milch, MD;  Location: [facility] ENDOSCOPY;  Service: Cardiopulmonary;  Laterality: N/A;   VIDEO BRONCHOSCOPY WITH ENDOBRONCHIAL ULTRASOUND N/A 05/13/2013   Procedure: VIDEO BRONCHOSCOPY WITH ENDOBRONCHIAL ULTRASOUND;  Surgeon: Loreli Slot, MD;  Location: [facility] OR;  Service: Thoracic;  Laterality: N/A;    Social History   Socioeconomic History   Marital status: Divorced    Spouse name: Not on file   Number of children: 3   Years of education: Not on file   Highest education level: Not on file  Occupational History   Occupation: Pensions consultant     Employer: [employer]  Tobacco Use   Smoking status: Former  Current packs/day: 0.00    Types: Cigarettes    Quit date: 04/19/2013    Years since quitting: 10.2   Smokeless tobacco: Never  Vaping Use   Vaping status: Never Used  Substance and Sexual Activity   Alcohol use: Yes    Comment: occassionally   Drug use: No   Sexual activity: Not Currently    Birth control/protection: Surgical  Other Topics Concern   Not on file  Social History Narrative   Not on file   Social Determinants of Health   Financial Resource Strain: Low Risk  (08/21/2022)   Overall Financial Resource Strain (CARDIA)    Difficulty of Paying Living Expenses: Not hard at all  Food Insecurity: No Food Insecurity (08/21/2022)   Hunger Vital Sign    Worried About Running Out of Food in the Last Year: Never true    Ran Out of Food in  the Last Year: Never true  Transportation Needs: No Transportation Needs (08/21/2022)   PRAPARE - Administrator, Civil Service (Medical): No    Lack of Transportation (Non-Medical): No  Physical Activity: Insufficiently Active (08/21/2022)   Exercise Vital Sign    Days of Exercise per Week: 5 days    Minutes of Exercise per Session: 20 min  Stress: No Stress Concern Present (08/21/2022)   [employer] of Occupational Health - Occupational Stress Questionnaire    Feeling of Stress : Not at all  Social Connections: Moderately Integrated (08/21/2022)   Social Connection and Isolation Panel [NHANES]    Frequency of Communication with Friends and Family: More than three times a week    Frequency of Social Gatherings with Friends and Family: Twice a week    Attends Religious Services: More than 4 times per year    Active Member of [employer] or Organizations: Yes    Attends Banker Meetings: 1 to 4 times per year    Marital Status: Divorced    Family History  Problem Relation Age of Onset   Colon polyps Mother    Hypertension Mother    Heart disease Mother    Hyperlipidemia Mother    Cancer Father        prostate   Hypertension Father    Hyperlipidemia Father    Heart defect Other        family history    Asthma Other        family history    Colon cancer Neg Hx     Outpatient Encounter Medications as of 08/03/2023  Medication Sig   empagliflozin (JARDIANCE) 10 MG TABS tablet Take 1 tablet (10 mg total) by mouth daily before breakfast.   Accu-Chek Softclix Lancets lancets Use to check glucose 2 x daily as instructed   albuterol (PROVENTIL HFA;VENTOLIN HFA) 108 (90 Base) MCG/ACT inhaler Inhale 2 puffs into the lungs every 4 (four) hours as needed for wheezing or shortness of breath.   Ascorbic Acid (VITAMIN C PO) Take 1 tablet by mouth in the morning.   Cholecalciferol (VITAMIN D3 PO) Take 1 tablet by mouth in the morning.   furosemide (LASIX) 20 MG tablet Take  20 mg by mouth in the morning.   gabapentin (NEURONTIN) 100 MG capsule Take 100 mg by mouth in the morning.   latanoprost (XALATAN) 0.005 % ophthalmic solution Place 1 drop into both eyes at bedtime.   metFORMIN (GLUCOPHAGE) 500 MG tablet Take 2 tablets (1,000 mg total) by mouth 2 (two) times daily with a meal.   metoprolol  tartrate (LOPRESSOR) 25 MG tablet Take 25 mg by mouth 2 (two) times daily.   montelukast (SINGULAIR) 10 MG tablet Take 10 mg by mouth in the morning.   Na Sulfate-K Sulfate-Mg Sulf 17.5-3.13-1.6 GM/177ML SOLN As directed (Patient not taking: Reported on 08/21/2022)   naproxen sodium (ALEVE) 220 MG tablet Take 220-440 mg by mouth 2 (two) times daily as needed (back pain.).   potassium chloride SA (KLOR-CON M) 20 MEQ tablet Take 20 mEq by mouth in the morning.   rosuvastatin (CRESTOR) 10 MG tablet Take 10 mg by mouth in the morning.   TRELEGY ELLIPTA 200-62.5-25 MCG/ACT AEPB Take 1 puff by mouth in the morning.   No facility-administered encounter medications on file as of 08/03/2023.    ALLERGIES: Allergies  Allergen Reactions   Iodine Anaphylaxis    Told to avoid due to shellfish allergy. Told to avoid due to shellfish allergy.   Shellfish-Derived Products Anaphylaxis and Swelling    Eyes swelling, scratchy throat, bumps on lips.    VACCINATION STATUS: Immunization History  Administered Date(s) Administered   DTaP / Hep B / IPV 10/04/2014   HIB (PRP-T) 10/04/2014   Influenza,inj,Quad PF,6+ Mos 11/04/2013, 10/04/2014   Influenza-Unspecified 09/14/2018   Moderna Sars-Covid-2 Vaccination 03/08/2020, 04/10/2020   Pneumococcal Conjugate-13 10/04/2014    Diabetes She presents for her follow-up diabetic visit. She has type 2 diabetes mellitus. Onset time: She was diagnosed at approximate age of 50 years. Her disease course has been improving. There are no hypoglycemic associated symptoms. Pertinent negatives for hypoglycemia include no confusion, headaches, pallor or  seizures. Pertinent negatives for diabetes include no chest pain, no fatigue, no polydipsia, no polyphagia and no polyuria. There are no hypoglycemic complications. Symptoms are improving. There are no diabetic complications. Risk factors for coronary artery disease include diabetes mellitus, dyslipidemia, family history, obesity, hypertension, tobacco exposure, sedentary lifestyle and post-menopausal. Current diabetic treatment includes insulin injections (She is currently on Lantus 60 units nightly, Metformin 1000 mg p.o. twice daily.). Her weight is fluctuating minimally. She is following a generally unhealthy diet. When asked about meal planning, she reported none. She has not had a previous visit with a dietitian. She rarely participates in exercise. Her home blood glucose trend is decreasing steadily. Her overall blood glucose range is 180-200 mg/dl. (She presents with improved glycemic profile.  Her point-of-care A1c is 6.9%, overall improving from 14%.  She has no hypoglycemia.  She remains on metformin 1000 mg p.o. twice daily.   ) An ACE inhibitor/angiotensin II receptor blocker is being taken. Eye exam is current.  Hyperlipidemia This is a chronic problem. The current episode started more than 1 year ago. Exacerbating diseases include diabetes. Pertinent negatives include no chest pain, myalgias or shortness of breath. Current antihyperlipidemic treatment includes statins. Risk factors for coronary artery disease include dyslipidemia, diabetes mellitus, hypertension, obesity, a sedentary lifestyle, post-menopausal and family history.  Hypertension This is a chronic problem. The current episode started more than 1 year ago. Pertinent negatives include no chest pain, headaches, palpitations or shortness of breath. Risk factors for coronary artery disease include diabetes mellitus, dyslipidemia, family history, obesity, post-menopausal state, smoking/tobacco exposure and sedentary lifestyle. Past  treatments include angiotensin blockers.    Review of Systems  Constitutional:  Negative for chills, fatigue, fever and unexpected weight change.  HENT:  Negative for trouble swallowing and voice change.   Eyes:  Negative for visual disturbance.  Respiratory:  Negative for cough, shortness of breath and wheezing.   Cardiovascular:  Negative for chest pain, palpitations and leg swelling.  Gastrointestinal:  Negative for diarrhea, nausea and vomiting.  Endocrine: Negative for cold intolerance, heat intolerance, polydipsia, polyphagia and polyuria.  Musculoskeletal:  Negative for arthralgias and myalgias.  Skin:  Negative for color change, pallor, rash and wound.  Neurological:  Negative for seizures and headaches.  Psychiatric/Behavioral:  Negative for confusion and suicidal ideas.     Objective:       08/03/2023   10:08 AM 04/01/2023    8:41 AM 11/28/2022    9:39 AM  Vitals with BMI  Height 5\' 8"  5\' 8"  5\' 8"   Weight 264 lbs 6 oz 270 lbs 272 lbs 3 oz  BMI 40.21 41.06 41.4  Systolic 120 128 161  Diastolic 82 74 74  Pulse 68 64 56    BP 120/82   Pulse 68   Ht 5\' 8"  (1.727 m)   Wt 264 lb 6.4 oz (119.9 kg)   BMI 40.20 kg/m   Wt Readings from Last 3 Encounters:  08/03/23 264 lb 6.4 oz (119.9 kg)  04/01/23 270 lb (122.5 kg)  11/28/22 272 lb 3.2 oz (123.5 kg)      CMP ( most recent) CMP     Component Value Date/Time   NA 140 03/26/2023 1101   NA 143 11/19/2022 1041   NA 144 09/04/2017 0916   K 4.0 03/26/2023 1101   K 4.1 09/04/2017 0916   CL 105 03/26/2023 1101   CO2 27 03/26/2023 1101   CO2 26 09/04/2017 0916   GLUCOSE 181 (H) 03/26/2023 1101   GLUCOSE 197 (H) 09/04/2017 0916   BUN 15 03/26/2023 1101   BUN 13 11/19/2022 1041   BUN 15.6 09/04/2017 0916   CREATININE 0.96 03/26/2023 1101   CREATININE 0.84 04/05/2020 0900   CREATININE 1.0 09/04/2017 0916   CALCIUM 9.2 03/26/2023 1101   CALCIUM 9.5 09/04/2017 0916   PROT 6.7 03/26/2023 1101   PROT 6.6 11/19/2022  1041   PROT 6.8 09/04/2017 0916   ALBUMIN 4.0 03/26/2023 1101   ALBUMIN 4.1 11/19/2022 1041   ALBUMIN 3.8 09/04/2017 0916   AST 21 03/26/2023 1101   AST 18 04/05/2020 0900   AST 19 09/04/2017 0916   ALT 27 03/26/2023 1101   ALT 14 04/05/2020 0900   ALT 18 09/04/2017 0916   ALKPHOS 59 03/26/2023 1101   ALKPHOS 49 09/04/2017 0916   BILITOT 1.0 03/26/2023 1101   BILITOT 0.3 11/19/2022 1041   BILITOT 0.3 04/05/2020 0900   BILITOT 0.49 09/04/2017 0916   GFRNONAA >60 03/26/2023 1101   GFRNONAA >60 04/05/2020 0900   GFRAA 66 11/05/2020 0927   GFRAA >60 04/05/2020 0900    Diabetic Labs (most recent): Lab Results  Component Value Date   HGBA1C 6.9 08/03/2023   HGBA1C 7.9 (A) 04/01/2023   HGBA1C 7.2 (A) 11/28/2022    Lab Results  Component Value Date   TSH 3.470 03/24/2023   TSH 4.930 (H) 11/19/2022   TSH 3.210 11/22/2021   TSH 3.770 11/05/2020   TSH 2.859 05/06/2013   FREET4 1.11 03/24/2023   FREET4 0.94 11/19/2022   FREET4 0.97 11/22/2021   FREET4 0.79 (L) 11/05/2020   FREET4 0.93 05/06/2013    Lipid Panel     Component Value Date/Time   CHOL 154 11/19/2022 1041   TRIG 161 (H) 11/19/2022 1041   HDL 50 11/19/2022 1041   CHOLHDL 3.1 11/19/2022 1041   LDLCALC 76 11/19/2022 1041   LABVLDL 28 11/19/2022 1041  Assessment & Plan:   1. Type 2 diabetes mellitus with hyperglycemia, without long-term current use of insulin (HCC)  - Lucillia H Ginsberg has currently uncontrolled symptomatic type 2 DM since  62 years of age.  She presents with improved glycemic profile.  Her point-of-care A1c is 6.9%, overall improving from 14%.  She has no hypoglycemia.  She remains on metformin 1000 mg p.o. twice daily.    -- Recent labs reviewed.   - I had a long discussion with her about the progressive nature of diabetes and the pathology behind its complications. -her diabetes is complicated by obesity/sedentary life and she remains at a high risk for more acute and chronic complications  which include CAD, CVA, CKD, retinopathy, and neuropathy. These are all discussed in detail with her.  - she acknowledges that there is a room for improvement in her food and drink choices. - Suggestion is made for her to avoid simple carbohydrates  from her diet including Cakes, Sweet Desserts, Ice Cream, Soda (diet and regular), Sweet Tea, Candies, Chips, Cookies, Store Bought Juices, Alcohol , Artificial Sweeteners,  Coffee Creamer, and "Sugar-free" Products, Lemonade. This will help patient to have more stable blood glucose profile and potentially avoid unintended weight gain.  The following Lifestyle Medicine recommendations according to American College of Lifestyle Medicine  [facility]) were discussed and and offered to patient and she  agrees to start the journey:  A. Whole Foods, Plant-Based Nutrition comprising of fruits and vegetables, plant-based proteins, whole-grain carbohydrates was discussed in detail with the patient.   A list for source of those nutrients were also provided to the patient.  Patient will use only water or unsweetened tea for hydration. B.  The need to stay away from risky substances including alcohol, smoking; obtaining 7 to 9 hours of restorative sleep, at least 150 minutes of moderate intensity exercise weekly, the importance of healthy social connections,  and stress management techniques were discussed. C.  A full color page of  Calorie density of various food groups per pound showing examples of each food groups was provided to the patient.   -  she is advised to stick to a routine mealtimes to eat 3 meals  a day and avoid unnecessary snacks ( to snack only to correct hypoglycemia).    - she will be scheduled with Norm Salt, RDN, CDE for diabetes education.  - I have approached her with the following individualized plan to manage  her diabetes and patient agrees:   -She will benefit from SGLT2 inhibitor in terms of weight management.  I discussed and added  Jardiance 10 mg p.o. daily she is also advised to continue her metformin 1000 mg p.o. twice daily.    -She is advised and willing to monitor blood glucose twice daily-daily before breakfast and at bedtime. -She is encouraged to call clinic for hypoglycemia below 70 or hyperglycemia above 300 mg per DL.  - she will be considered for incretin therapy or SGLT2 inhibitors as appropriate next visit.  - Specific targets for  A1c;  LDL, HDL,  and Triglycerides were discussed with the patient.  2) Blood Pressure /Hypertension:  -Her blood pressure is controlled to target. -  she is advised to continue her current medications including losartan 25 mg p.o. daily with breakfast .  3) Lipids/Hyperlipidemia: Review of her recent lipid panel showed controlled LDL at 76.  She will continue to benefit from Crestor 10 mg . Whole food plant-based diet will help her control dyslipidemia  along with her statin.     Side effects and precautions discussed with her.  4)  Weight/Diet:  Body mass index is 40.2 kg/m.  -   clearly complicating her diabetes care.   she is  a candidate for weight loss. I discussed with her the fact that loss of 5 - 10% of her  current body weight will have the most impact on her diabetes management.  Exercise, and detailed carbohydrates information provided  -  detailed on discharge instructions.  5) Chronic Care/Health Maintenance:  -she  is on ACEI/ARB and Statin medications and  is encouraged to initiate and continue to follow up with Ophthalmology, Dentist,  Podiatrist at least yearly or according to recommendations, and advised to   stay away from smoking. I have recommended yearly flu vaccine and pneumonia vaccine at least every 5 years; moderate intensity exercise for up to 150 minutes weekly; and  sleep for at least 7 hours a day.  - she is  advised to maintain close follow up with Benetta Spar, MD for primary care needs, as well as her other providers for optimal and  coordinated care.   I spent  26  minutes in the care of the patient today including review of labs from CMP, Lipids, Thyroid Function, Hematology (current and previous including abstractions from other facilities); face-to-face time discussing  her blood glucose readings/logs, discussing hypoglycemia and hyperglycemia episodes and symptoms, medications doses, her options of short and long term treatment based on the latest standards of care / guidelines;  discussion about incorporating lifestyle medicine;  and documenting the encounter. Risk reduction counseling performed per USPSTF guidelines to reduce  obesity and cardiovascular risk factors.     Please refer to Patient Instructions for Blood Glucose Monitoring and Insulin/Medications Dosing Guide"  in media tab for additional information. Please  also refer to " Patient Self Inventory" in the Media  tab for reviewed elements of pertinent patient history.  Megan Mcknight participated in the discussions, expressed understanding, and voiced agreement with the above plans.  All questions were answered to her satisfaction. she is encouraged to contact clinic should she have any questions or concerns prior to her return visit.    Follow up plan: - Return in about 4 months (around 12/03/2023) for Fasting Labs  in AM B4 8, A1c -[place].  Marquis Lunch, MD [facility] [address] [place], [place] [address] Phone: [phone]  Fax: [phone]    08/03/2023, 2:41 PM  This note was partially dictated with voice recognition software. Similar sounding words can be transcribed inadequately or may not  be corrected upon review.

## 2023-08-03 NOTE — Patient Instructions (Signed)

## 2023-08-04 ENCOUNTER — Other Ambulatory Visit: Payer: Self-pay

## 2023-08-10 DIAGNOSIS — E1142 Type 2 diabetes mellitus with diabetic polyneuropathy: Secondary | ICD-10-CM | POA: Diagnosis not present

## 2023-08-10 DIAGNOSIS — B351 Tinea unguium: Secondary | ICD-10-CM | POA: Diagnosis not present

## 2023-08-10 DIAGNOSIS — L851 Acquired keratosis [keratoderma] palmaris et plantaris: Secondary | ICD-10-CM | POA: Diagnosis not present

## 2023-09-22 DIAGNOSIS — C8460 Anaplastic large cell lymphoma, ALK-positive, unspecified site: Secondary | ICD-10-CM | POA: Diagnosis not present

## 2023-09-28 DIAGNOSIS — C8590 Non-Hodgkin lymphoma, unspecified, unspecified site: Secondary | ICD-10-CM | POA: Diagnosis not present

## 2023-09-28 DIAGNOSIS — C8460 Anaplastic large cell lymphoma, ALK-positive, unspecified site: Secondary | ICD-10-CM | POA: Diagnosis not present

## 2023-10-01 ENCOUNTER — Ambulatory Visit: Payer: Medicare Other | Admitting: Obstetrics & Gynecology

## 2023-11-16 DIAGNOSIS — E1165 Type 2 diabetes mellitus with hyperglycemia: Secondary | ICD-10-CM | POA: Diagnosis not present

## 2023-11-17 ENCOUNTER — Encounter: Payer: Self-pay | Admitting: Oncology

## 2023-11-17 ENCOUNTER — Ambulatory Visit (INDEPENDENT_AMBULATORY_CARE_PROVIDER_SITE_OTHER): Payer: Medicare Other | Admitting: "Endocrinology

## 2023-11-17 ENCOUNTER — Encounter: Payer: Self-pay | Admitting: "Endocrinology

## 2023-11-17 VITALS — BP 120/76 | HR 64 | Ht 68.0 in | Wt 253.0 lb

## 2023-11-17 DIAGNOSIS — Z7984 Long term (current) use of oral hypoglycemic drugs: Secondary | ICD-10-CM

## 2023-11-17 DIAGNOSIS — Z6839 Body mass index (BMI) 39.0-39.9, adult: Secondary | ICD-10-CM | POA: Diagnosis not present

## 2023-11-17 DIAGNOSIS — E66812 Obesity, class 2: Secondary | ICD-10-CM

## 2023-11-17 DIAGNOSIS — E1165 Type 2 diabetes mellitus with hyperglycemia: Secondary | ICD-10-CM | POA: Diagnosis not present

## 2023-11-17 DIAGNOSIS — E782 Mixed hyperlipidemia: Secondary | ICD-10-CM | POA: Diagnosis not present

## 2023-11-17 DIAGNOSIS — I10 Essential (primary) hypertension: Secondary | ICD-10-CM

## 2023-11-17 LAB — POCT GLYCOSYLATED HEMOGLOBIN (HGB A1C): HbA1c, POC (controlled diabetic range): 6.1 % (ref 0.0–7.0)

## 2023-11-17 LAB — COMPREHENSIVE METABOLIC PANEL
ALT: 12 [IU]/L (ref 0–32)
AST: 18 [IU]/L (ref 0–40)
Albumin: 4 g/dL (ref 3.9–4.9)
Alkaline Phosphatase: 65 [IU]/L (ref 44–121)
BUN/Creatinine Ratio: 14 (ref 12–28)
BUN: 16 mg/dL (ref 8–27)
Bilirubin Total: 0.4 mg/dL (ref 0.0–1.2)
CO2: 22 mmol/L (ref 20–29)
Calcium: 9.6 mg/dL (ref 8.7–10.3)
Chloride: 108 mmol/L — ABNORMAL HIGH (ref 96–106)
Creatinine, Ser: 1.11 mg/dL — ABNORMAL HIGH (ref 0.57–1.00)
Globulin, Total: 2.6 g/dL (ref 1.5–4.5)
Glucose: 97 mg/dL (ref 70–99)
Potassium: 4.3 mmol/L (ref 3.5–5.2)
Sodium: 148 mmol/L — ABNORMAL HIGH (ref 134–144)
Total Protein: 6.6 g/dL (ref 6.0–8.5)
eGFR: 56 mL/min/{1.73_m2} — ABNORMAL LOW (ref >59)

## 2023-11-17 LAB — LIPID PANEL
Chol/HDL Ratio: 3.1 {ratio} (ref 0.0–4.4)
Cholesterol, Total: 140 mg/dL (ref 100–199)
HDL: 45 mg/dL (ref >39)
LDL Chol Calc (NIH): 70 mg/dL (ref 0–99)
Triglycerides: 141 mg/dL (ref 0–149)
VLDL Cholesterol Cal: 25 mg/dL (ref 5–40)

## 2023-11-17 MED ORDER — EMPAGLIFLOZIN 25 MG PO TABS: 25.0000 mg | ORAL_TABLET | Freq: Every day | ORAL | 1 refills | Status: AC

## 2023-11-17 NOTE — Progress Notes (Signed)
11/17/2023, 3:16 PM  Endocrinology follow-up note   Subjective:    Patient ID: Megan Mcknight, female    DOB: 10-07-1961.  Megan Mcknight is being seen in follow-up after she was seen in follow-up after she was seen in  consultation for management of currently uncontrolled symptomatic diabetes requested by  Benetta Spar, MD.   Past Medical History:  Diagnosis Date   Acute bronchitis    Anaplastic large cell lymphoma 2014   remission since 2015   Asthma    Cancer (HCC)    lung and kidney   Colon polyps 02/2013   Per colonoscopy   Coma (HCC)    Diabetes mellitus without complication (HCC)    Diverticulosis 02/2013   Per colonoscopy   Dysrhythmia    Former light tobacco smoker    GERD (gastroesophageal reflux disease)    H/O stem cell transplant (HCC) 2015   Hyperlipidemia    Nerve pain    Left leg   Non Hodgkin's lymphoma (HCC)    Seasonal allergies     Past Surgical History:  Procedure Laterality Date   bones removed from Baby toes     BRONCHIAL BRUSHINGS  04/25/2020   Procedure: BRONCHIAL BRUSHINGS;  Surgeon: Oretha Milch, MD;  Location: [facility] ENDOSCOPY;  Service: Cardiopulmonary;;   BRONCHIAL WASHINGS  04/25/2020   Procedure: BRONCHIAL WASHINGS;  Surgeon: Oretha Milch, MD;  Location: [facility] ENDOSCOPY;  Service: Cardiopulmonary;;   COLONOSCOPY  02/16/2012   Procedure: COLONOSCOPY;  Surgeon: Corbin Ade, MD;  Location: [facility] ENDO SUITE;  Service: Endoscopy;  Laterality: N/A;  1:30   COLONOSCOPY N/A 03/26/2017   Procedure: COLONOSCOPY;  Surgeon: Corbin Ade, MD;  Location: [facility] ENDO SUITE;  Service: Endoscopy;  Laterality: N/A;  930   COLONOSCOPY WITH PROPOFOL N/A 08/07/2022   Procedure: COLONOSCOPY WITH PROPOFOL;  Surgeon: Corbin Ade, MD;  Location: [facility] ENDO SUITE;  Service: Endoscopy;  Laterality: N/A;  2:00PM   IR IMAGING GUIDED PORT INSERTION  12/19/2019   LUNG BIOPSY  04/25/2020   Procedure:  LUNG BIOPSY;  Surgeon: Oretha Milch, MD;  Location: [facility] ENDOSCOPY;  Service: Cardiopulmonary;;   MEDIASTINOSCOPY N/A 05/13/2013   Procedure: MEDIASTINOSCOPY;  Surgeon: Loreli Slot, MD;  Location: [facility] OR;  Service: Thoracic;  Laterality: N/A;   POLYPECTOMY  08/07/2022   Procedure: POLYPECTOMY;  Surgeon: Corbin Ade, MD;  Location: [facility] ENDO SUITE;  Service: Endoscopy;;   VAGINAL HYSTERECTOMY     vaginal hyst (ovaries insitu)   VIDEO BRONCHOSCOPY N/A 04/25/2020   Procedure: VIDEO BRONCHOSCOPY WITH FLUORO;  Surgeon: Oretha Milch, MD;  Location: [facility] ENDOSCOPY;  Service: Cardiopulmonary;  Laterality: N/A;   VIDEO BRONCHOSCOPY WITH ENDOBRONCHIAL ULTRASOUND N/A 05/13/2013   Procedure: VIDEO BRONCHOSCOPY WITH ENDOBRONCHIAL ULTRASOUND;  Surgeon: Loreli Slot, MD;  Location: [facility] OR;  Service: Thoracic;  Laterality: N/A;    Social History   Socioeconomic History   Marital status: Divorced    Spouse name: Not on file   Number of children: 3   Years of education: Not on file   Highest education level: Not on file  Occupational History   Occupation: Pensions consultant     Employer: [employer]  Tobacco Use   Smoking status: Former  Current packs/day: 0.00    Types: Cigarettes    Quit date: 04/19/2013    Years since quitting: 10.5   Smokeless tobacco: Never  Vaping Use   Vaping status: Never Used  Substance and Sexual Activity   Alcohol use: Yes    Comment: occassionally   Drug use: No   Sexual activity: Not Currently    Birth control/protection: Surgical  Other Topics Concern   Not on file  Social History Narrative   Not on file   Social Determinants of Health   Financial Resource Strain: Low Risk  (08/21/2022)   Overall Financial Resource Strain (CARDIA)    Difficulty of Paying Living Expenses: Not hard at all  Food Insecurity: No Food Insecurity (08/21/2022)   Hunger Vital Sign    Worried About Running Out of Food in the Last Year: Never true    Ran Out of Food in  the Last Year: Never true  Transportation Needs: No Transportation Needs (08/21/2022)   PRAPARE - Administrator, Civil Service (Medical): No    Lack of Transportation (Non-Medical): No  Physical Activity: Insufficiently Active (08/21/2022)   Exercise Vital Sign    Days of Exercise per Week: 5 days    Minutes of Exercise per Session: 20 min  Stress: No Stress Concern Present (08/21/2022)   [employer] of Occupational Health - Occupational Stress Questionnaire    Feeling of Stress : Not at all  Social Connections: Moderately Integrated (08/21/2022)   Social Connection and Isolation Panel [NHANES]    Frequency of Communication with Friends and Family: More than three times a week    Frequency of Social Gatherings with Friends and Family: Twice a week    Attends Religious Services: More than 4 times per year    Active Member of [employer] or Organizations: Yes    Attends Banker Meetings: 1 to 4 times per year    Marital Status: Divorced    Family History  Problem Relation Age of Onset   Colon polyps Mother    Hypertension Mother    Heart disease Mother    Hyperlipidemia Mother    Cancer Father        prostate   Hypertension Father    Hyperlipidemia Father    Heart defect Other        family history    Asthma Other        family history    Colon cancer Neg Hx     Outpatient Encounter Medications as of 11/17/2023  Medication Sig   Accu-Chek Softclix Lancets lancets Use to check glucose 2 x daily as instructed   albuterol (PROVENTIL HFA;VENTOLIN HFA) 108 (90 Base) MCG/ACT inhaler Inhale 2 puffs into the lungs every 4 (four) hours as needed for wheezing or shortness of breath.   Ascorbic Acid (VITAMIN C PO) Take 1 tablet by mouth in the morning.   Cholecalciferol (VITAMIN D3 PO) Take 1 tablet by mouth in the morning.   empagliflozin (JARDIANCE) 25 MG TABS tablet Take 1 tablet (25 mg total) by mouth daily before breakfast.   furosemide (LASIX) 20 MG tablet Take  20 mg by mouth in the morning.   gabapentin (NEURONTIN) 100 MG capsule Take 100 mg by mouth in the morning.   latanoprost (XALATAN) 0.005 % ophthalmic solution Place 1 drop into both eyes at bedtime.   metFORMIN (GLUCOPHAGE) 500 MG tablet Take 2 tablets (1,000 mg total) by mouth 2 (two) times daily with a meal.   metoprolol  tartrate (LOPRESSOR) 25 MG tablet Take 25 mg by mouth 2 (two) times daily.   montelukast (SINGULAIR) 10 MG tablet Take 10 mg by mouth in the morning.   Na Sulfate-K Sulfate-Mg Sulf 17.5-3.13-1.6 GM/177ML SOLN As directed (Patient not taking: Reported on 08/21/2022)   naproxen sodium (ALEVE) 220 MG tablet Take 220-440 mg by mouth 2 (two) times daily as needed (back pain.).   potassium chloride SA (KLOR-CON M) 20 MEQ tablet Take 20 mEq by mouth in the morning.   rosuvastatin (CRESTOR) 10 MG tablet Take 10 mg by mouth in the morning.   TRELEGY ELLIPTA 200-62.5-25 MCG/ACT AEPB Take 1 puff by mouth in the morning.   [DISCONTINUED] empagliflozin (JARDIANCE) 10 MG TABS tablet Take 1 tablet (10 mg total) by mouth daily before breakfast.   No facility-administered encounter medications on file as of 11/17/2023.    ALLERGIES: Allergies  Allergen Reactions   Iodine Anaphylaxis    Told to avoid due to shellfish allergy. Told to avoid due to shellfish allergy.   Shellfish-Derived Products Anaphylaxis and Swelling    Eyes swelling, scratchy throat, bumps on lips.    VACCINATION STATUS: Immunization History  Administered Date(s) Administered   DTaP / Hep B / IPV 10/04/2014   HIB (PRP-T) 10/04/2014   Influenza,inj,Quad PF,6+ Mos 11/04/2013, 10/04/2014   Influenza-Unspecified 09/14/2018   Moderna Sars-Covid-2 Vaccination 03/08/2020, 04/10/2020   Pneumococcal Conjugate-13 10/04/2014    Diabetes She presents for her follow-up diabetic visit. She has type 2 diabetes mellitus. Onset time: She was diagnosed at approximate age of 50 years. Her disease course has been improving. There  are no hypoglycemic associated symptoms. Pertinent negatives for hypoglycemia include no confusion, headaches, pallor or seizures. Pertinent negatives for diabetes include no chest pain, no fatigue, no polydipsia, no polyphagia and no polyuria. There are no hypoglycemic complications. Symptoms are improving. There are no diabetic complications. Risk factors for coronary artery disease include diabetes mellitus, dyslipidemia, family history, obesity, hypertension, tobacco exposure, sedentary lifestyle and post-menopausal. Current diabetic treatment includes insulin injections (She is currently on Lantus 60 units nightly, Metformin 1000 mg p.o. twice daily.). Her weight is decreasing steadily. She is following a generally unhealthy diet. When asked about meal planning, she reported none. She has not had a previous visit with a dietitian. She rarely participates in exercise. Her home blood glucose trend is decreasing steadily. (She presents with continued improvement in her glycemic profile.  Her point-of-care A1c 6.1% improving progressively from 14%.  She has no hypoglycemia.  She is tolerating her metformin and Jardiance.    ) An ACE inhibitor/angiotensin II receptor blocker is being taken. Eye exam is current.  Hyperlipidemia This is a chronic problem. The current episode started more than 1 year ago. Exacerbating diseases include diabetes. Pertinent negatives include no chest pain, myalgias or shortness of breath. Current antihyperlipidemic treatment includes statins. Risk factors for coronary artery disease include dyslipidemia, diabetes mellitus, hypertension, obesity, a sedentary lifestyle, post-menopausal and family history.  Hypertension This is a chronic problem. The current episode started more than 1 year ago. Pertinent negatives include no chest pain, headaches, palpitations or shortness of breath. Risk factors for coronary artery disease include diabetes mellitus, dyslipidemia, family history,  obesity, post-menopausal state, smoking/tobacco exposure and sedentary lifestyle. Past treatments include angiotensin blockers.    Review of Systems  Constitutional:  Negative for chills, fatigue, fever and unexpected weight change.  HENT:  Negative for trouble swallowing and voice change.   Eyes:  Negative for visual disturbance.  Respiratory:  Negative for cough, shortness of breath and wheezing.   Cardiovascular:  Negative for chest pain, palpitations and leg swelling.  Gastrointestinal:  Negative for diarrhea, nausea and vomiting.  Endocrine: Negative for cold intolerance, heat intolerance, polydipsia, polyphagia and polyuria.  Musculoskeletal:  Negative for arthralgias and myalgias.  Skin:  Negative for color change, pallor, rash and wound.  Neurological:  Negative for seizures and headaches.  Psychiatric/Behavioral:  Negative for confusion and suicidal ideas.     Objective:       11/17/2023   10:11 AM 08/03/2023   10:08 AM 04/01/2023    8:41 AM  Vitals with BMI  Height 5\' 8"  5\' 8"  5\' 8"   Weight 253 lbs 264 lbs 6 oz 270 lbs  BMI 38.48 40.21 41.06  Systolic 120 120 440  Diastolic 76 82 74  Pulse 64 68 64    BP 120/76   Pulse 64   Ht 5\' 8"  (1.727 m)   Wt 253 lb (114.8 kg)   BMI 38.47 kg/m   Wt Readings from Last 3 Encounters:  11/17/23 253 lb (114.8 kg)  08/03/23 264 lb 6.4 oz (119.9 kg)  04/01/23 270 lb (122.5 kg)      CMP ( most recent) CMP     Component Value Date/Time   NA 148 (H) 11/16/2023 0912   NA 144 09/04/2017 0916   K 4.3 11/16/2023 0912   K 4.1 09/04/2017 0916   CL 108 (H) 11/16/2023 0912   CO2 22 11/16/2023 0912   CO2 26 09/04/2017 0916   GLUCOSE 97 11/16/2023 0912   GLUCOSE 181 (H) 03/26/2023 1101   GLUCOSE 197 (H) 09/04/2017 0916   BUN 16 11/16/2023 0912   BUN 15.6 09/04/2017 0916   CREATININE 1.11 (H) 11/16/2023 0912   CREATININE 0.84 04/05/2020 0900   CREATININE 1.0 09/04/2017 0916   CALCIUM 9.6 11/16/2023 0912   CALCIUM 9.5  09/04/2017 0916   PROT 6.6 11/16/2023 0912   PROT 6.8 09/04/2017 0916   ALBUMIN 4.0 11/16/2023 0912   ALBUMIN 3.8 09/04/2017 0916   AST 18 11/16/2023 0912   AST 18 04/05/2020 0900   AST 19 09/04/2017 0916   ALT 12 11/16/2023 0912   ALT 14 04/05/2020 0900   ALT 18 09/04/2017 0916   ALKPHOS 65 11/16/2023 0912   ALKPHOS 49 09/04/2017 0916   BILITOT 0.4 11/16/2023 0912   BILITOT 0.3 04/05/2020 0900   BILITOT 0.49 09/04/2017 0916   GFRNONAA >60 03/26/2023 1101   GFRNONAA >60 04/05/2020 0900   GFRAA 66 11/05/2020 0927   GFRAA >60 04/05/2020 0900    Diabetic Labs (most recent): Lab Results  Component Value Date   HGBA1C 6.1 11/17/2023   HGBA1C 6.9 08/03/2023   HGBA1C 7.9 (A) 04/01/2023    Lab Results  Component Value Date   TSH 3.470 03/24/2023   TSH 4.930 (H) 11/19/2022   TSH 3.210 11/22/2021   TSH 3.770 11/05/2020   TSH 2.859 05/06/2013   FREET4 1.11 03/24/2023   FREET4 0.94 11/19/2022   FREET4 0.97 11/22/2021   FREET4 0.79 (L) 11/05/2020   FREET4 0.93 05/06/2013    Lipid Panel     Component Value Date/Time   CHOL 140 11/16/2023 0912   TRIG 141 11/16/2023 0912   HDL 45 11/16/2023 0912   CHOLHDL 3.1 11/16/2023 0912   LDLCALC 70 11/16/2023 0912   LABVLDL 25 11/16/2023 0912    Assessment & Plan:   1. Type 2 diabetes mellitus with hyperglycemia, without long-term current use of insulin (  HCC)  - Conleigh H Skiff has currently uncontrolled symptomatic type 2 DM since  62 years of age.  She presents with continued improvement in her glycemic profile.  Her point-of-care A1c 6.1% improving progressively from 14%.  She has no hypoglycemia.  She is tolerating her metformin and Jardiance.    -- Recent labs reviewed.   - I had a long discussion with her about the progressive nature of diabetes and the pathology behind its complications. -her diabetes is complicated by obesity/sedentary life and she remains at a high risk for more acute and chronic complications which  include CAD, CVA, CKD, retinopathy, and neuropathy. These are all discussed in detail with her.  - she acknowledges that there is a room for improvement in her food and drink choices. - Suggestion is made for her to avoid simple carbohydrates  from her diet including Cakes, Sweet Desserts, Ice Cream, Soda (diet and regular), Sweet Tea, Candies, Chips, Cookies, Store Bought Juices, Alcohol , Artificial Sweeteners,  Coffee Creamer, and "Sugar-free" Products, Lemonade. This will help patient to have more stable blood glucose profile and potentially avoid unintended weight gain.  The following Lifestyle Medicine recommendations according to American College of Lifestyle Medicine  [facility]) were discussed and and offered to patient and she  agrees to start the journey:  A. Whole Foods, Plant-Based Nutrition comprising of fruits and vegetables, plant-based proteins, whole-grain carbohydrates was discussed in detail with the patient.   A list for source of those nutrients were also provided to the patient.  Patient will use only water or unsweetened tea for hydration. B.  The need to stay away from risky substances including alcohol, smoking; obtaining 7 to 9 hours of restorative sleep, at least 150 minutes of moderate intensity exercise weekly, the importance of healthy social connections,  and stress management techniques were discussed. C.  A full color page of  Calorie density of various food groups per pound showing examples of each food groups was provided to the patient.   -  she is advised to stick to a routine mealtimes to eat 3 meals  a day and avoid unnecessary snacks ( to snack only to correct hypoglycemia).    - she will be scheduled with Norm Salt, RDN, CDE for diabetes education.  - I have approached her with the following individualized plan to manage  her diabetes and patient agrees:   -She is benefiting from [place].  I discussed and increase her Jardiance to 25 mg p.o. daily at  breakfast.  She is also advised to continue metformin 1000 mg p.o. daily.    She is advised and willing to monitor blood glucose twice daily-daily before breakfast and at bedtime. -She is encouraged to call clinic for hypoglycemia below 70 or hyperglycemia above 300 mg per DL.   - Specific targets for  A1c;  LDL, HDL,  and Triglycerides were discussed with the patient.  2) Blood Pressure /Hypertension:  -Her blood pressure is controlled to target. -  she is advised to continue her current medications including losartan 25 mg p.o. daily with breakfast .  3) Lipids/Hyperlipidemia: Review of her recent lipid panel showed improved LDL at 70.  She will continue to benefit from Crestor 10 mg . Whole food plant-based diet will help her control dyslipidemia along with her statin.     Side effects and precautions discussed with her.  4)  Weight/Diet:  Body mass index is 38.47 kg/m.  -   clearly complicating her diabetes care.  she is  a candidate for weight loss. I discussed with her the fact that loss of 5 - 10% of her  current body weight will have the most impact on her diabetes management.  Exercise, and detailed carbohydrates information provided  -  detailed on discharge instructions.  5) Chronic Care/Health Maintenance:  -she  is on ACEI/ARB and Statin medications and  is encouraged to initiate and continue to follow up with Ophthalmology, Dentist,  Podiatrist at least yearly or according to recommendations, and advised to   stay away from smoking. I have recommended yearly flu vaccine and pneumonia vaccine at least every 5 years; moderate intensity exercise for up to 150 minutes weekly; and  sleep for at least 7 hours a day.  - she is  advised to maintain close follow up with Benetta Spar, MD for primary care needs, as well as her other providers for optimal and coordinated care.  I spent  26  minutes in the care of the patient today including review of labs from CMP, Lipids,  Thyroid Function, Hematology (current and previous including abstractions from other facilities); face-to-face time discussing  her blood glucose readings/logs, discussing hypoglycemia and hyperglycemia episodes and symptoms, medications doses, her options of short and long term treatment based on the latest standards of care / guidelines;  discussion about incorporating lifestyle medicine;  and documenting the encounter. Risk reduction counseling performed per USPSTF guidelines to reduce  obesity and cardiovascular risk factors.     Please refer to Patient Instructions for Blood Glucose Monitoring and Insulin/Medications Dosing Guide"  in media tab for additional information. Please  also refer to " Patient Self Inventory" in the Media  tab for reviewed elements of pertinent patient history.  Lavonne Chick participated in the discussions, expressed understanding, and voiced agreement with the above plans.  All questions were answered to her satisfaction. she is encouraged to contact clinic should she have any questions or concerns prior to her return visit.     Follow up plan: - Return in about 4 months (around 03/17/2024) for A1c -[place].  Marquis Lunch, MD [facility] [address] [place], [place] [address] Phone: [phone]  Fax: [phone]    11/17/2023, 3:16 PM  This note was partially dictated with voice recognition software. Similar sounding words can be transcribed inadequately or may not  be corrected upon review.

## 2023-11-17 NOTE — Patient Instructions (Signed)

## 2023-11-18 ENCOUNTER — Other Ambulatory Visit: Payer: Self-pay

## 2023-11-27 DIAGNOSIS — R600 Localized edema: Secondary | ICD-10-CM | POA: Diagnosis not present

## 2023-11-27 DIAGNOSIS — E119 Type 2 diabetes mellitus without complications: Secondary | ICD-10-CM | POA: Diagnosis not present

## 2023-11-27 DIAGNOSIS — E785 Hyperlipidemia, unspecified: Secondary | ICD-10-CM | POA: Diagnosis not present

## 2023-11-27 DIAGNOSIS — I10 Essential (primary) hypertension: Secondary | ICD-10-CM | POA: Diagnosis not present

## 2023-12-12 ENCOUNTER — Other Ambulatory Visit: Payer: Self-pay | Admitting: "Endocrinology

## 2024-02-08 DIAGNOSIS — L851 Acquired keratosis [keratoderma] palmaris et plantaris: Secondary | ICD-10-CM | POA: Diagnosis not present

## 2024-02-08 DIAGNOSIS — L853 Xerosis cutis: Secondary | ICD-10-CM | POA: Diagnosis not present

## 2024-02-08 DIAGNOSIS — E1142 Type 2 diabetes mellitus with diabetic polyneuropathy: Secondary | ICD-10-CM | POA: Diagnosis not present

## 2024-02-25 DIAGNOSIS — I10 Essential (primary) hypertension: Secondary | ICD-10-CM | POA: Diagnosis not present

## 2024-02-25 DIAGNOSIS — E119 Type 2 diabetes mellitus without complications: Secondary | ICD-10-CM | POA: Diagnosis not present

## 2024-02-25 DIAGNOSIS — R600 Localized edema: Secondary | ICD-10-CM | POA: Diagnosis not present

## 2024-02-25 DIAGNOSIS — J45909 Unspecified asthma, uncomplicated: Secondary | ICD-10-CM | POA: Diagnosis not present

## 2024-03-15 ENCOUNTER — Other Ambulatory Visit: Payer: Self-pay | Admitting: "Endocrinology

## 2024-03-17 ENCOUNTER — Ambulatory Visit (INDEPENDENT_AMBULATORY_CARE_PROVIDER_SITE_OTHER): Payer: Medicare Other | Admitting: "Endocrinology

## 2024-03-17 ENCOUNTER — Encounter: Payer: Self-pay | Admitting: "Endocrinology

## 2024-03-17 VITALS — BP 110/78 | HR 72 | Ht 68.0 in | Wt 256.6 lb

## 2024-03-17 DIAGNOSIS — E66812 Obesity, class 2: Secondary | ICD-10-CM

## 2024-03-17 DIAGNOSIS — Z6839 Body mass index (BMI) 39.0-39.9, adult: Secondary | ICD-10-CM

## 2024-03-17 DIAGNOSIS — E782 Mixed hyperlipidemia: Secondary | ICD-10-CM

## 2024-03-17 DIAGNOSIS — Z7984 Long term (current) use of oral hypoglycemic drugs: Secondary | ICD-10-CM

## 2024-03-17 DIAGNOSIS — I10 Essential (primary) hypertension: Secondary | ICD-10-CM

## 2024-03-17 DIAGNOSIS — E1165 Type 2 diabetes mellitus with hyperglycemia: Secondary | ICD-10-CM | POA: Diagnosis not present

## 2024-03-17 LAB — POCT GLYCOSYLATED HEMOGLOBIN (HGB A1C)

## 2024-03-17 MED ORDER — METFORMIN HCL 500 MG PO TABS: 1000.0000 mg | ORAL_TABLET | Freq: Every day | ORAL | 1 refills | Status: AC

## 2024-03-17 NOTE — Patient Instructions (Signed)

## 2024-03-17 NOTE — Progress Notes (Signed)
 03/17/2024, 5:40 PM  Endocrinology follow-up note   Subjective:    Patient ID: Megan Mcknight, female    DOB: [birth year].  Megan Mcknight is being seen in follow-up after she was seen in follow-up after she was seen in  consultation for management of currently uncontrolled symptomatic diabetes requested by  Benetta Spar, MD.   Past Medical History:  Diagnosis Date   Acute bronchitis    Anaplastic large cell lymphoma 2014   remission since 2015   Asthma    Cancer (HCC)    lung and kidney   Colon polyps 02/2013   Per colonoscopy   Coma (HCC)    Diabetes mellitus without complication (HCC)    Diverticulosis 02/2013   Per colonoscopy   Dysrhythmia    Former light tobacco smoker    GERD (gastroesophageal reflux disease)    H/O stem cell transplant (HCC) 2015   Hyperlipidemia    Nerve pain    Left leg   Non Hodgkin's lymphoma (HCC)    Seasonal allergies     Past Surgical History:  Procedure Laterality Date   bones removed from Baby toes     BRONCHIAL BRUSHINGS  04/25/2020   Procedure: BRONCHIAL BRUSHINGS;  Surgeon: Oretha Milch, MD;  Location: [facility] ENDOSCOPY;  Service: Cardiopulmonary;;   BRONCHIAL WASHINGS  04/25/2020   Procedure: BRONCHIAL WASHINGS;  Surgeon: Oretha Milch, MD;  Location: [facility] ENDOSCOPY;  Service: Cardiopulmonary;;   COLONOSCOPY  02/16/2012   Procedure: COLONOSCOPY;  Surgeon: Corbin Ade, MD;  Location: [facility] ENDO SUITE;  Service: Endoscopy;  Laterality: N/A;  1:30   COLONOSCOPY N/A 03/26/2017   Procedure: COLONOSCOPY;  Surgeon: Corbin Ade, MD;  Location: [facility] ENDO SUITE;  Service: Endoscopy;  Laterality: N/A;  930   COLONOSCOPY WITH PROPOFOL N/A 08/07/2022   Procedure: COLONOSCOPY WITH PROPOFOL;  Surgeon: Corbin Ade, MD;  Location: [facility] ENDO SUITE;  Service: Endoscopy;  Laterality: N/A;  2:00PM   IR IMAGING GUIDED PORT INSERTION  12/19/2019   LUNG BIOPSY  04/25/2020   Procedure:  LUNG BIOPSY;  Surgeon: Oretha Milch, MD;  Location: [facility] ENDOSCOPY;  Service: Cardiopulmonary;;   MEDIASTINOSCOPY N/A 05/13/2013   Procedure: MEDIASTINOSCOPY;  Surgeon: Loreli Slot, MD;  Location: [facility] OR;  Service: Thoracic;  Laterality: N/A;   POLYPECTOMY  08/07/2022   Procedure: POLYPECTOMY;  Surgeon: Corbin Ade, MD;  Location: [facility] ENDO SUITE;  Service: Endoscopy;;   VAGINAL HYSTERECTOMY     vaginal hyst (ovaries insitu)   VIDEO BRONCHOSCOPY N/A 04/25/2020   Procedure: VIDEO BRONCHOSCOPY WITH FLUORO;  Surgeon: Oretha Milch, MD;  Location: [facility] ENDOSCOPY;  Service: Cardiopulmonary;  Laterality: N/A;   VIDEO BRONCHOSCOPY WITH ENDOBRONCHIAL ULTRASOUND N/A 05/13/2013   Procedure: VIDEO BRONCHOSCOPY WITH ENDOBRONCHIAL ULTRASOUND;  Surgeon: Loreli Slot, MD;  Location: [facility] OR;  Service: Thoracic;  Laterality: N/A;    Social History   Socioeconomic History   Marital status: Divorced    Spouse name: Not on file   Number of children: 3   Years of education: Not on file   Highest education level: Not on file  Occupational History   Occupation: Pensions consultant     Employer: [employer]  Tobacco Use   Smoking status: Former  Current packs/day: 0.00    Types: Cigarettes    Quit date: 04/19/2013    Years since quitting: 10.9   Smokeless tobacco: Never  Vaping Use   Vaping status: Never Used  Substance and Sexual Activity   Alcohol use: Yes    Comment: occassionally   Drug use: No   Sexual activity: Not Currently    Birth control/protection: Surgical  Other Topics Concern   Not on file  Social History Narrative   Not on file   Social Drivers of Health   Financial Resource Strain: Low Risk  (08/21/2022)   Overall Financial Resource Strain (CARDIA)    Difficulty of Paying Living Expenses: Not hard at all  Food Insecurity: No Food Insecurity (08/21/2022)   Hunger Vital Sign    Worried About Running Out of Food in the Last Year: Never true    Ran Out of Food in the  Last Year: Never true  Transportation Needs: No Transportation Needs (08/21/2022)   PRAPARE - Administrator, Civil Service (Medical): No    Lack of Transportation (Non-Medical): No  Physical Activity: Insufficiently Active (08/21/2022)   Exercise Vital Sign    Days of Exercise per Week: 5 days    Minutes of Exercise per Session: 20 min  Stress: No Stress Concern Present (08/21/2022)   [employer] of Occupational Health - Occupational Stress Questionnaire    Feeling of Stress : Not at all  Social Connections: Moderately Integrated (08/21/2022)   Social Connection and Isolation Panel [NHANES]    Frequency of Communication with Friends and Family: More than three times a week    Frequency of Social Gatherings with Friends and Family: Twice a week    Attends Religious Services: More than 4 times per year    Active Member of [employer] or Organizations: Yes    Attends Banker Meetings: 1 to 4 times per year    Marital Status: Divorced    Family History  Problem Relation Age of Onset   Colon polyps Mother    Hypertension Mother    Heart disease Mother    Hyperlipidemia Mother    Cancer Father        prostate   Hypertension Father    Hyperlipidemia Father    Heart defect Other        family history    Asthma Other        family history    Colon cancer Neg Hx     Outpatient Encounter Medications as of 03/17/2024  Medication Sig   Accu-Chek Softclix Lancets lancets Use to check glucose 2 x daily as instructed   albuterol (PROVENTIL HFA;VENTOLIN HFA) 108 (90 Base) MCG/ACT inhaler Inhale 2 puffs into the lungs every 4 (four) hours as needed for wheezing or shortness of breath.   Ascorbic Acid (VITAMIN C PO) Take 1 tablet by mouth in the morning.   Cholecalciferol (VITAMIN D3 PO) Take 1 tablet by mouth in the morning.   empagliflozin (JARDIANCE) 25 MG TABS tablet Take 1 tablet (25 mg total) by mouth daily before breakfast.   furosemide (LASIX) 20 MG tablet Take 20 mg  by mouth in the morning.   gabapentin (NEURONTIN) 100 MG capsule Take 100 mg by mouth in the morning.   latanoprost (XALATAN) 0.005 % ophthalmic solution Place 1 drop into both eyes at bedtime.   metFORMIN (GLUCOPHAGE) 500 MG tablet Take 2 tablets (1,000 mg total) by mouth daily with breakfast.   metoprolol tartrate (LOPRESSOR) 25 MG  tablet Take 25 mg by mouth 2 (two) times daily.   montelukast (SINGULAIR) 10 MG tablet Take 10 mg by mouth in the morning.   Na Sulfate-K Sulfate-Mg Sulf 17.5-3.13-1.6 GM/177ML SOLN As directed (Patient not taking: Reported on 08/21/2022)   naproxen sodium (ALEVE) 220 MG tablet Take 220-440 mg by mouth 2 (two) times daily as needed (back pain.).   potassium chloride SA (KLOR-CON M) 20 MEQ tablet Take 20 mEq by mouth in the morning.   rosuvastatin (CRESTOR) 10 MG tablet Take 10 mg by mouth in the morning.   TRELEGY ELLIPTA 200-62.5-25 MCG/ACT AEPB Take 1 puff by mouth in the morning.   [DISCONTINUED] metFORMIN (GLUCOPHAGE) 500 MG tablet Take 2 tablets (1,000 mg total) by mouth daily with breakfast.   No facility-administered encounter medications on file as of 03/17/2024.    ALLERGIES: Allergies  Allergen Reactions   Iodine Anaphylaxis    Told to avoid due to shellfish allergy. Told to avoid due to shellfish allergy.   Shellfish-Derived Products Anaphylaxis and Swelling    Eyes swelling, scratchy throat, bumps on lips.    VACCINATION STATUS: Immunization History  Administered Date(s) Administered   DTaP / Hep B / IPV 10/04/2014   HIB (PRP-T) 10/04/2014   Influenza,inj,Quad PF,6+ Mos 11/04/2013, 10/04/2014   Influenza-Unspecified 09/14/2018   Moderna Sars-Covid-2 Vaccination 03/08/2020, 04/10/2020   Pneumococcal Conjugate-13 10/04/2014    Diabetes She presents for her follow-up diabetic visit. She has type 2 diabetes mellitus. Onset time: She was diagnosed at approximate age of 50 years. Her disease course has been stable. There are no hypoglycemic  associated symptoms. Pertinent negatives for hypoglycemia include no confusion, headaches, pallor or seizures. Pertinent negatives for diabetes include no chest pain, no fatigue, no polydipsia, no polyphagia and no polyuria. There are no hypoglycemic complications. Symptoms are stable. There are no diabetic complications. Risk factors for coronary artery disease include diabetes mellitus, dyslipidemia, family history, obesity, hypertension, tobacco exposure, sedentary lifestyle and post-menopausal. Current diabetic treatment includes insulin injections (She is currently on Lantus 60 units nightly, Metformin 1000 mg p.o. twice daily.). Her weight is decreasing steadily. She is following a generally unhealthy diet. When asked about meal planning, she reported none. She has not had a previous visit with a dietitian. She rarely participates in exercise. Her home blood glucose trend is fluctuating minimally. (She presents with continued improvement in her glycemic profile.  Her point-of-care 1C 6.5%, overall improving from 14%.   She has no hypoglycemia.  She is tolerating her metformin and Jardiance.    ) An ACE inhibitor/angiotensin II receptor blocker is being taken. Eye exam is current.  Hyperlipidemia This is a chronic problem. The current episode started more than 1 year ago. Exacerbating diseases include diabetes. Pertinent negatives include no chest pain, myalgias or shortness of breath. Current antihyperlipidemic treatment includes statins. Risk factors for coronary artery disease include dyslipidemia, diabetes mellitus, hypertension, obesity, a sedentary lifestyle, post-menopausal and family history.  Hypertension This is a chronic problem. The current episode started more than 1 year ago. Pertinent negatives include no chest pain, headaches, palpitations or shortness of breath. Risk factors for coronary artery disease include diabetes mellitus, dyslipidemia, family history, obesity, post-menopausal  state, smoking/tobacco exposure and sedentary lifestyle. Past treatments include angiotensin blockers.    Review of Systems  Constitutional:  Negative for chills, fatigue, fever and unexpected weight change.  HENT:  Negative for trouble swallowing and voice change.   Eyes:  Negative for visual disturbance.  Respiratory:  Negative for cough,  shortness of breath and wheezing.   Cardiovascular:  Negative for chest pain, palpitations and leg swelling.  Gastrointestinal:  Negative for diarrhea, nausea and vomiting.  Endocrine: Negative for cold intolerance, heat intolerance, polydipsia, polyphagia and polyuria.  Musculoskeletal:  Negative for arthralgias and myalgias.  Skin:  Negative for color change, pallor, rash and wound.  Neurological:  Negative for seizures and headaches.  Psychiatric/Behavioral:  Negative for confusion and suicidal ideas.     Objective:       03/17/2024    1:36 PM 11/17/2023   10:11 AM 08/03/2023   10:08 AM  Vitals with BMI  Height 5\' 8"  5\' 8"  5\' 8"   Weight 256 lbs 10 oz 253 lbs 264 lbs 6 oz  BMI 39.02 38.48 40.21  Systolic 110 120 433  Diastolic 78 76 82  Pulse 72 64 68    BP 110/78   Pulse 72   Ht 5\' 8"  (1.727 m)   Wt 256 lb 9.6 oz (116.4 kg)   BMI 39.02 kg/m   Wt Readings from Last 3 Encounters:  03/17/24 256 lb 9.6 oz (116.4 kg)  11/17/23 253 lb (114.8 kg)  08/03/23 264 lb 6.4 oz (119.9 kg)      CMP ( most recent) CMP     Component Value Date/Time   NA 148 (H) 11/16/2023 0912   NA 144 09/04/2017 0916   K 4.3 11/16/2023 0912   K 4.1 09/04/2017 0916   CL 108 (H) 11/16/2023 0912   CO2 22 11/16/2023 0912   CO2 26 09/04/2017 0916   GLUCOSE 97 11/16/2023 0912   GLUCOSE 181 (H) 03/26/2023 1101   GLUCOSE 197 (H) 09/04/2017 0916   BUN 16 11/16/2023 0912   BUN 15.6 09/04/2017 0916   CREATININE 1.11 (H) 11/16/2023 0912   CREATININE 0.84 04/05/2020 0900   CREATININE 1.0 09/04/2017 0916   CALCIUM 9.6 11/16/2023 0912   CALCIUM 9.5 09/04/2017 0916    PROT 6.6 11/16/2023 0912   PROT 6.8 09/04/2017 0916   ALBUMIN 4.0 11/16/2023 0912   ALBUMIN 3.8 09/04/2017 0916   AST 18 11/16/2023 0912   AST 18 04/05/2020 0900   AST 19 09/04/2017 0916   ALT 12 11/16/2023 0912   ALT 14 04/05/2020 0900   ALT 18 09/04/2017 0916   ALKPHOS 65 11/16/2023 0912   ALKPHOS 49 09/04/2017 0916   BILITOT 0.4 11/16/2023 0912   BILITOT 0.3 04/05/2020 0900   BILITOT 0.49 09/04/2017 0916   GFRNONAA >60 03/26/2023 1101   GFRNONAA >60 04/05/2020 0900   GFRAA 66 11/05/2020 0927   GFRAA >60 04/05/2020 0900    Diabetic Labs (most recent): Lab Results  Component Value Date   HGBA1C 6.1 11/17/2023   HGBA1C 6.9 08/03/2023   HGBA1C 7.9 (A) 04/01/2023    Lab Results  Component Value Date   TSH 3.470 03/24/2023   TSH 4.930 (H) 11/19/2022   TSH 3.210 11/22/2021   TSH 3.770 11/05/2020   TSH 2.859 05/06/2013   FREET4 1.11 03/24/2023   FREET4 0.94 11/19/2022   FREET4 0.97 11/22/2021   FREET4 0.79 (L) 11/05/2020   FREET4 0.93 05/06/2013    Lipid Panel     Component Value Date/Time   CHOL 140 11/16/2023 0912   TRIG 141 11/16/2023 0912   HDL 45 11/16/2023 0912   CHOLHDL 3.1 11/16/2023 0912   LDLCALC 70 11/16/2023 0912   LABVLDL 25 11/16/2023 0912    Assessment & Plan:   1. Type 2 diabetes mellitus with hyperglycemia, without long-term current  use of insulin (HCC)  - Megan Mcknight has currently uncontrolled symptomatic type 2 DM since  63 years of age.  She presents with continued improvement in her glycemic profile.  Her point-of-care 1C 6.5%, overall improving from 14%.   She has no hypoglycemia.  She is tolerating her metformin and Jardiance.    -- Recent labs reviewed.   - I had a long discussion with her about the progressive nature of diabetes and the pathology behind its complications. -her diabetes is complicated by obesity/sedentary life and she remains at a high risk for more acute and chronic complications which include CAD, CVA, CKD,  retinopathy, and neuropathy. These are all discussed in detail with her.  - she acknowledges that there is a room for improvement in her food and drink choices.  - she acknowledges that there is a room for improvement in her food and drink choices. - Suggestion is made for her to avoid simple carbohydrates  from her diet including Cakes, Sweet Desserts, Ice Cream, Soda (diet and regular), Sweet Tea, Candies, Chips, Cookies, Store Bought Juices, Alcohol , Artificial Sweeteners,  Coffee Creamer, and "Sugar-free" Products, Lemonade. This will help patient to have more stable blood glucose profile and potentially avoid unintended weight gain.  The following Lifestyle Medicine recommendations according to American College of Lifestyle Medicine  [facility]) were discussed and and offered to patient and she  agrees to start the journey:  A. Whole Foods, Plant-Based Nutrition comprising of fruits and vegetables, plant-based proteins, whole-grain carbohydrates was discussed in detail with the patient.   A list for source of those nutrients were also provided to the patient.  Patient will use only water or unsweetened tea for hydration. B.  The need to stay away from risky substances including alcohol, smoking; obtaining 7 to 9 hours of restorative sleep, at least 150 minutes of moderate intensity exercise weekly, the importance of healthy social connections,  and stress management techniques were discussed. C.  A full color page of  Calorie density of various food groups per pound showing examples of each food groups was provided to the patient.   -  she is advised to stick to a routine mealtimes to eat 3 meals  a day and avoid unnecessary snacks ( to snack only to correct hypoglycemia).    - she will be scheduled with Norm Salt, RDN, CDE for diabetes education.  - I have approached her with the following individualized plan to manage  her diabetes and patient agrees:   -She is benefiting from her  current regimen.  She is advised to continue Jardiance 25 mg p.o. daily at breakfast as well as metformin 1000 mg p.o. daily at breakfast.    She is advised and willing to monitor blood glucose twice daily-daily before breakfast and at bedtime. -She is encouraged to call clinic for hypoglycemia below 70 or hyperglycemia above 200 mg per DL.   - Specific targets for  A1c;  LDL, HDL,  and Triglycerides were discussed with the patient.  2) Blood Pressure /Hypertension:  Her blood pressure is controlled to target. -  she is advised to continue her current medications including losartan 25 mg p.o. daily with breakfast .  3) Lipids/Hyperlipidemia: Review of her recent lipid panel showed improved LDL at 70.  She is advised to continue Crestor 10 mg p.o. daily at bedtime.  Whole food plant-based diet will help her control dyslipidemia along with her statin.     Side effects and precautions discussed  with her.  4)  Weight/Diet:  Body mass index is 39.02 kg/m.  -   clearly complicating her diabetes care.   she is  a candidate for weight loss. I discussed with her the fact that loss of 5 - 10% of her  current body weight will have the most impact on her diabetes management.  Exercise, and detailed carbohydrates information provided  -  detailed on discharge instructions.  5) Chronic Care/Health Maintenance:  -she  is on ACEI/ARB and Statin medications and  is encouraged to initiate and continue to follow up with Ophthalmology, Dentist,  Podiatrist at least yearly or according to recommendations, and advised to   stay away from smoking. I have recommended yearly flu vaccine and pneumonia vaccine at least every 5 years; moderate intensity exercise for up to 150 minutes weekly; and  sleep for at least 7 hours a day.  - she is  advised to maintain close follow up with Benetta Spar, MD for primary care needs, as well as her other providers for optimal and coordinated care.   I spent  26  minutes  in the care of the patient today including review of labs from CMP, Lipids, Thyroid Function, Hematology (current and previous including abstractions from other facilities); face-to-face time discussing  her blood glucose readings/logs, discussing hypoglycemia and hyperglycemia episodes and symptoms, medications doses, her options of short and long term treatment based on the latest standards of care / guidelines;  discussion about incorporating lifestyle medicine;  and documenting the encounter. Risk reduction counseling performed per USPSTF guidelines to reduce  obesity and cardiovascular risk factors.     Please refer to Patient Instructions for Blood Glucose Monitoring and Insulin/Medications Dosing Guide"  in media tab for additional information. Please  also refer to " Patient Self Inventory" in the Media  tab for reviewed elements of pertinent patient history.  Lavonne Chick participated in the discussions, expressed understanding, and voiced agreement with the above plans.  All questions were answered to her satisfaction. she is encouraged to contact clinic should she have any questions or concerns prior to her return visit.      Follow up plan: - Return in about 4 months (around 07/17/2024) for F/U with Pre-visit Labs, Meter/CGM/Logs, A1c here.  Marquis Lunch, MD [facility] [address] [place], [place] [address] Phone: [phone]  Fax: [phone]    03/17/2024, 5:40 PM  This note was partially dictated with voice recognition software. Similar sounding words can be transcribed inadequately or may not  be corrected upon review.

## 2024-03-18 ENCOUNTER — Other Ambulatory Visit: Payer: Self-pay

## 2024-05-19 ENCOUNTER — Emergency Department (HOSPITAL_COMMUNITY)

## 2024-05-19 ENCOUNTER — Other Ambulatory Visit: Payer: Self-pay

## 2024-05-19 ENCOUNTER — Encounter (HOSPITAL_COMMUNITY): Payer: Self-pay

## 2024-05-19 ENCOUNTER — Emergency Department (HOSPITAL_COMMUNITY)
Admission: EM | Admit: 2024-05-19 | Discharge: 2024-05-19 | Disposition: A | Discharge status: Home / Self Care | Attending: Emergency Medicine | Admitting: Emergency Medicine

## 2024-05-19 DIAGNOSIS — Z8572 Personal history of non-Hodgkin lymphomas: Secondary | ICD-10-CM | POA: Diagnosis not present

## 2024-05-19 DIAGNOSIS — Z79899 Other long term (current) drug therapy: Secondary | ICD-10-CM | POA: Insufficient documentation

## 2024-05-19 DIAGNOSIS — R051 Acute cough: Secondary | ICD-10-CM

## 2024-05-19 DIAGNOSIS — E119 Type 2 diabetes mellitus without complications: Secondary | ICD-10-CM | POA: Insufficient documentation

## 2024-05-19 DIAGNOSIS — R000 Tachycardia, unspecified: Secondary | ICD-10-CM | POA: Insufficient documentation

## 2024-05-19 DIAGNOSIS — J45909 Unspecified asthma, uncomplicated: Secondary | ICD-10-CM | POA: Diagnosis not present

## 2024-05-19 DIAGNOSIS — R059 Cough, unspecified: Secondary | ICD-10-CM | POA: Diagnosis present

## 2024-05-19 DIAGNOSIS — Z7984 Long term (current) use of oral hypoglycemic drugs: Secondary | ICD-10-CM | POA: Diagnosis not present

## 2024-05-19 DIAGNOSIS — U071 COVID-19: Secondary | ICD-10-CM | POA: Diagnosis not present

## 2024-05-19 LAB — RESP PANEL BY RT-PCR (RSV, FLU A&B, COVID)  RVPGX2
Influenza A by PCR: NEGATIVE
Influenza B by PCR: NEGATIVE
Resp Syncytial Virus by PCR: NEGATIVE
SARS Coronavirus 2 by RT PCR: POSITIVE — AB

## 2024-05-19 LAB — BASIC METABOLIC PANEL WITH GFR
Anion gap: 6 (ref 5–15)
BUN: 11 mg/dL (ref 8–23)
CO2: 23 mmol/L (ref 22–32)
Calcium: 8.8 mg/dL — ABNORMAL LOW (ref 8.9–10.3)
Chloride: 107 mmol/L (ref 98–111)
Creatinine, Ser: 0.99 mg/dL (ref 0.44–1.00)
GFR, Estimated: >60 mL/min (ref >60)
Glucose, Bld: 96 mg/dL (ref 70–99)
Potassium: 3.7 mmol/L (ref 3.5–5.1)
Sodium: 136 mmol/L (ref 135–145)

## 2024-05-19 LAB — CBC WITH DIFFERENTIAL/PLATELET
Abs Immature Granulocytes: 0.02 10*3/uL (ref 0.00–0.07)
Basophils Absolute: 0 10*3/uL (ref 0.0–0.1)
Basophils Relative: 0 %
Eosinophils Absolute: 0 10*3/uL (ref 0.0–0.5)
Eosinophils Relative: 0 %
HCT: 44.9 % (ref 36.0–46.0)
Hemoglobin: 14 g/dL (ref 12.0–15.0)
Immature Granulocytes: 0 %
Lymphocytes Relative: 16 %
Lymphs Abs: 1.1 10*3/uL (ref 0.7–4.0)
MCH: 26.8 pg (ref 26.0–34.0)
MCHC: 31.2 g/dL (ref 30.0–36.0)
MCV: 86 fL (ref 80.0–100.0)
Monocytes Absolute: 0.7 10*3/uL (ref 0.1–1.0)
Monocytes Relative: 10 %
Neutro Abs: 5.1 10*3/uL (ref 1.7–7.7)
Neutrophils Relative %: 74 %
Platelets: 150 10*3/uL (ref 150–400)
RBC: 5.22 MIL/uL — ABNORMAL HIGH (ref 3.87–5.11)
RDW: 14.5 % (ref 11.5–15.5)
WBC: 7 10*3/uL (ref 4.0–10.5)
nRBC: 0 % (ref 0.0–0.2)

## 2024-05-19 MED ORDER — AZITHROMYCIN 250 MG PO TABS: 250.0000 mg | ORAL_TABLET | Freq: Every day | ORAL | 0 refills | Status: DC

## 2024-05-19 MED ORDER — AMOXICILLIN-POT CLAVULANATE 875-125 MG PO TABS: 1.0000 | ORAL_TABLET | Freq: Two times a day (BID) | ORAL | 0 refills | Status: DC

## 2024-05-19 MED ORDER — BENZONATATE 200 MG PO CAPS: 200.0000 mg | ORAL_CAPSULE | Freq: Three times a day (TID) | ORAL | 0 refills | Status: DC | PRN

## 2024-05-19 MED ORDER — ALBUTEROL SULFATE HFA 108 (90 BASE) MCG/ACT IN AERS
2.0000 | INHALATION_SPRAY | Freq: Once | RESPIRATORY_TRACT | Status: AC
  Administered 2024-05-19: 2 via RESPIRATORY_TRACT
  Filled 2024-05-19: qty 6.7

## 2024-05-19 NOTE — Discharge Instructions (Signed)
 Your test today showed that you have COVID.  Rest, drink plenty of water .  You may take Tylenol  every 4 hours if needed for body aches or fever.  Use the inhaler, 1 to 2 puffs every 4-6 hours as needed.  Take the antibiotics as directed until finished.  Please call your primary care provider to arrange follow-up appointment for next week.  Return to emergency department if you develop any new or worsening symptoms

## 2024-05-19 NOTE — ED Provider Notes (Signed)
 [place] EMERGENCY DEPARTMENT AT [facility] Provider Note   CSN: [id number] Arrival date & time: 05/19/24  1128     History  Chief Complaint  Patient presents with   Cough    Megan Mcknight is a 63 y.o. female.   Cough Associated symptoms: chills, myalgias and shortness of breath   Associated symptoms: no chest pain, no fever, no sore throat and no wheezing         Megan Mcknight is a 63 y.o. female past medical history of large cell lymphoma, type 2 diabetes, asthma, non-Hodgkin's lymphoma who presents to the Emergency Department complaining of generalized bodyaches, chills, cough.  Symptoms began yesterday.  Cough has been mostly nonproductive.  No reported fever.  No nausea vomiting abdominal pain, diarrhea or chest pain.  She has been taking over-the-counter cold and sinus medications without relief.  Feels mildly short of breath, mostly associated with coughing.    Home Medications Prior to Admission medications   Medication Sig Start Date End Date Taking? Authorizing Provider  Accu-Chek Softclix Lancets lancets Use to check glucose 2 x daily as instructed 04/01/23   Nida, Gebreselassie W, MD  albuterol  (PROVENTIL  HFA;VENTOLIN  HFA) 108 (90 Base) MCG/ACT inhaler Inhale 2 puffs into the lungs every 4 (four) hours as needed for wheezing or shortness of breath. 10/27/16   Ballard Bongo, MD  Ascorbic Acid (VITAMIN C PO) Take 1 tablet by mouth in the morning.    [provider]  Cholecalciferol (VITAMIN D3 PO) Take 1 tablet by mouth in the morning.    [provider]  empagliflozin  (JARDIANCE ) 25 MG TABS tablet Take 1 tablet (25 mg total) by mouth daily before breakfast. 11/17/23   Nida, Gebreselassie W, MD  furosemide  (LASIX ) 20 MG tablet Take 20 mg by mouth in the morning. 05/16/22   [provider]  gabapentin  (NEURONTIN ) 100 MG capsule Take 100 mg by mouth in the morning. 11/08/21   [provider]  latanoprost (XALATAN)  0.005 % ophthalmic solution Place 1 drop into both eyes at bedtime. 12/13/19   [provider]  metFORMIN  (GLUCOPHAGE ) 500 MG tablet Take 2 tablets (1,000 mg total) by mouth daily with breakfast. 03/17/24   Nida, Jaynee Meyer, MD  metoprolol  tartrate (LOPRESSOR ) 25 MG tablet Take 25 mg by mouth 2 (two) times daily.    [provider]  montelukast  (SINGULAIR ) 10 MG tablet Take 10 mg by mouth in the morning.    [provider]  Na Sulfate-K Sulfate-Mg Sulf 17.5-3.13-1.6 GM/177ML SOLN As directed Patient not taking: Reported on 08/21/2022 07/23/22   Rourk, Windsor Hatcher, MD  naproxen  sodium (ALEVE ) 220 MG tablet Take 220-440 mg by mouth 2 (two) times daily as needed (back pain.).    [provider]  potassium chloride  SA (KLOR-CON  M) 20 MEQ tablet Take 20 mEq by mouth in the morning. 05/18/22   [provider]  rosuvastatin  (CRESTOR ) 10 MG tablet Take 10 mg by mouth in the morning.    [provider]  Gaylan Kaufman 200-62.5-25 MCG/ACT AEPB Take 1 puff by mouth in the morning. 05/14/22   [provider]      Allergies    Iodine and Shellfish-derived products    Review of Systems   Review of Systems  Constitutional:  Positive for chills. Negative for appetite change and fever.  HENT:  Negative for congestion, sore throat and trouble swallowing.   Respiratory:  Positive for cough and shortness of breath. Negative for  wheezing.   Cardiovascular:  Negative for chest pain and leg swelling.  Gastrointestinal:  Negative for abdominal pain, diarrhea, nausea and vomiting.  Genitourinary:  Negative for decreased urine volume, difficulty urinating and dysuria.  Musculoskeletal:  Positive for myalgias. Negative for back pain and neck pain.  Neurological:  Negative for weakness and numbness.  Psychiatric/Behavioral:  Negative for confusion.     Physical Exam Updated Vital Signs BP (!) 154/93 (BP Location: Right Arm)   Pulse (!) 111   Temp 98.5 F  (36.9 C)   Resp 18   Ht 5\' 8"  (1.727 m)   Wt 116.4 kg   SpO2 100%   BMI 39.02 kg/m  Physical Exam Vitals and nursing note reviewed.  Constitutional:      General: She is not in acute distress.    Appearance: Normal appearance. She is not toxic-appearing.  HENT:     Right Ear: Tympanic membrane and ear canal normal.     Left Ear: Ear canal normal.     Ears:     Comments: Mild erythema of the left TM.  No perforation or bulging.    Mouth/Throat:     Mouth: Mucous membranes are moist.     Pharynx: Oropharynx is clear. No posterior oropharyngeal erythema.  Eyes:     Conjunctiva/sclera: Conjunctivae normal.  Cardiovascular:     Rate and Rhythm: Regular rhythm. Tachycardia present.     Pulses: Normal pulses.  Pulmonary:     Effort: Pulmonary effort is normal.     Breath sounds: Normal breath sounds.     Comments: Lung sounds are clear to auscultation bilaterally.  No increased work of breathing. Abdominal:     Palpations: Abdomen is soft.     Tenderness: There is no abdominal tenderness.  Musculoskeletal:        General: Normal range of motion.     Cervical back: Normal range of motion. No tenderness.  Lymphadenopathy:     Cervical: No cervical adenopathy.  Skin:    General: Skin is warm.     Capillary Refill: Capillary refill takes less than 2 seconds.  Neurological:     General: No focal deficit present.     Mental Status: She is alert.     Sensory: No sensory deficit.     Motor: No weakness.     ED Results / Procedures / Treatments   Labs (all labs ordered are listed, but only abnormal results are displayed) Labs Reviewed  RESP PANEL BY RT-PCR (RSV, FLU A&B, COVID)  RVPGX2 - Abnormal; Notable for the following components:      Result Value   SARS Coronavirus 2 by RT PCR POSITIVE (*)    All other components within normal limits  CBC WITH DIFFERENTIAL/PLATELET - Abnormal; Notable for the following components:   RBC 5.22 (*)    All other components within normal  limits  BASIC METABOLIC PANEL WITH GFR - Abnormal; Notable for the following components:   Calcium  8.8 (*)    All other components within normal limits    EKG None  Radiology DG Chest 2 View Result Date: 05/19/2024 CLINICAL DATA:  Cough, shortness of breath. EXAM: CHEST - 2 VIEW COMPARISON:  August 29, 2021. FINDINGS: The heart size and mediastinal contours are within normal limits. Minimal right basilar subsegmental atelectasis or infiltrate is noted. Left lung is clear. The visualized skeletal structures are unremarkable. IMPRESSION: Minimal right basilar subsegmental atelectasis or pneumonia. Followup PA and lateral chest X-ray is recommended in 3-4 weeks following  trial of antibiotic therapy to ensure resolution and exclude underlying malignancy. Electronically Signed   By: Rosalene Colon M.D.   On: 05/19/2024 12:29    Procedures Procedures    Medications Ordered in ED Medications  albuterol  (VENTOLIN  HFA) 108 (90 Base) MCG/ACT inhaler 2 puff (2 puffs Inhalation Given 05/19/24 1415)    ED Course/ Medical Decision Making/ A&P                                 Medical Decision Making Patient here with complaint of generalized bodyaches myalgias cough and chills.  Symptoms began yesterday.  No known sick contacts.  Shortness of breath associated with coughing.  No chest pain abdominal pain vomiting or diarrhea.  She denies fever   Differential would include but not limited to pneumonia, bronchitis, URI, viral process such as COVID or influenza also considered.  PE included in differential diagnosis but felt less likely  Amount and/or Complexity of Data Reviewed Labs: ordered.    Details: Respiratory panel is positive for COVID  No leukocytosis, chemistries unremarkable Radiology: ordered.    Details: Chest x-ray shows right basilar subsegmental atelectasis or pneumonia Discussion of management or test interpretation with external provider(s): Lung sounds are clear to  auscultation bilaterally.  No hypoxia here.  Albuterol  MDI given here and dispensed for home use as needed.  She is COVID-positive with possible pneumonia on chest x-ray.  Given her comorbidities, will treat with antibiotics, Tessalon  for her cough.  She is ambulatory in the department with steady gait and without hypoxia.Aaron Aas  she is appropriate for discharge home, she will follow-up early next week with PCP for recheck and she was given return precautions as well.  Risk Prescription drug management.           Final Clinical Impression(s) / ED Diagnoses Final diagnoses:  COVID  Acute cough    Rx / DC Orders ED Discharge Orders     None         Catherne Clubs, PA-C 05/19/24 1548    Merdis Stalling, MD 05/19/24 [phone]

## 2024-05-19 NOTE — ED Triage Notes (Signed)
 Pt arrived via POV c/o cough, congestion and headache with generalized body aches that began last night. Pt reports trying OTC medications w/o relief.

## 2024-06-21 ENCOUNTER — Encounter: Payer: Self-pay | Admitting: Oncology

## 2024-07-14 ENCOUNTER — Encounter: Payer: Self-pay | Admitting: Oncology

## 2024-07-15 ENCOUNTER — Encounter: Payer: Self-pay | Admitting: Oncology

## 2024-07-15 LAB — COMPREHENSIVE METABOLIC PANEL WITH GFR
ALT: 16 IU/L (ref 0–32)
AST: 16 IU/L (ref 0–40)
Albumin: 4.3 g/dL (ref 3.9–4.9)
Alkaline Phosphatase: 76 IU/L (ref 44–121)
BUN/Creatinine Ratio: 16 (ref 12–28)
BUN: 16 mg/dL (ref 8–27)
Bilirubin Total: 0.3 mg/dL (ref 0.0–1.2)
CO2: 22 mmol/L (ref 20–29)
Calcium: 9.4 mg/dL (ref 8.7–10.3)
Chloride: 105 mmol/L (ref 96–106)
Creatinine, Ser: 0.99 mg/dL (ref 0.57–1.00)
Globulin, Total: 2.5 g/dL (ref 1.5–4.5)
Glucose: 85 mg/dL (ref 70–99)
Potassium: 4.5 mmol/L (ref 3.5–5.2)
Sodium: 145 mmol/L — ABNORMAL HIGH (ref 134–144)
Total Protein: 6.8 g/dL (ref 6.0–8.5)
eGFR: 64 mL/min/1.73 (ref >59)

## 2024-07-15 LAB — LIPID PANEL
Chol/HDL Ratio: 2.9 ratio (ref 0.0–4.4)
Cholesterol, Total: 135 mg/dL (ref 100–199)
HDL: 47 mg/dL (ref >39)
LDL Chol Calc (NIH): 67 mg/dL (ref 0–99)
Triglycerides: 118 mg/dL (ref 0–149)
VLDL Cholesterol Cal: 21 mg/dL (ref 5–40)

## 2024-07-19 ENCOUNTER — Encounter: Payer: Self-pay | Admitting: "Endocrinology

## 2024-07-19 ENCOUNTER — Ambulatory Visit (INDEPENDENT_AMBULATORY_CARE_PROVIDER_SITE_OTHER): Admitting: "Endocrinology

## 2024-07-19 VITALS — BP 114/76 | HR 76 | Ht 68.0 in | Wt 262.8 lb

## 2024-07-19 DIAGNOSIS — E782 Mixed hyperlipidemia: Secondary | ICD-10-CM | POA: Diagnosis not present

## 2024-07-19 DIAGNOSIS — I10 Essential (primary) hypertension: Secondary | ICD-10-CM | POA: Diagnosis not present

## 2024-07-19 DIAGNOSIS — Z7984 Long term (current) use of oral hypoglycemic drugs: Secondary | ICD-10-CM

## 2024-07-19 DIAGNOSIS — Z7985 Long-term (current) use of injectable non-insulin antidiabetic drugs: Secondary | ICD-10-CM

## 2024-07-19 DIAGNOSIS — E1165 Type 2 diabetes mellitus with hyperglycemia: Secondary | ICD-10-CM

## 2024-07-19 DIAGNOSIS — E66812 Obesity, class 2: Secondary | ICD-10-CM

## 2024-07-19 DIAGNOSIS — Z6839 Body mass index (BMI) 39.0-39.9, adult: Secondary | ICD-10-CM

## 2024-07-19 LAB — POCT GLYCOSYLATED HEMOGLOBIN (HGB A1C): HbA1c, POC (controlled diabetic range): 6.9 % (ref 0.0–7.0)

## 2024-07-19 MED ORDER — TIRZEPATIDE 2.5 MG/0.5ML ~~LOC~~ SOAJ
2.5000 mg | SUBCUTANEOUS | 0 refills | Status: DC
Start: 2024-07-19 — End: 2024-10-13

## 2024-07-19 NOTE — Progress Notes (Signed)
 07/19/2024, 5:14 PM  Endocrinology follow-up note   Subjective:    Patient ID: Megan Mcknight, female    DOB: March 26, 1962.  Megan Mcknight is being seen in follow-up after she was seen in follow-up after she was seen in  consultation for management of currently uncontrolled symptomatic diabetes requested by  Carlette Benita Area, MD.   Past Medical History:  Diagnosis Date   Acute bronchitis    Anaplastic large cell lymphoma 2014   remission since 2015   Asthma    Cancer (HCC)    lung and kidney   Colon polyps 02/2013   Per colonoscopy   Coma (HCC)    Diabetes mellitus without complication (HCC)    Diverticulosis 02/2013   Per colonoscopy   Dysrhythmia    Former light tobacco smoker    GERD (gastroesophageal reflux disease)    H/O stem cell transplant (HCC) 2015   Hyperlipidemia    Nerve pain    Left leg   Non Hodgkin's lymphoma (HCC)    Seasonal allergies     Past Surgical History:  Procedure Laterality Date   bones removed from Baby toes     BRONCHIAL BRUSHINGS  04/25/2020   Procedure: BRONCHIAL BRUSHINGS;  Surgeon: Jude Harden GAILS, MD;  Location: [facility] ENDOSCOPY;  Service: Cardiopulmonary;;   BRONCHIAL WASHINGS  04/25/2020   Procedure: BRONCHIAL WASHINGS;  Surgeon: Jude Harden GAILS, MD;  Location: [facility] ENDOSCOPY;  Service: Cardiopulmonary;;   COLONOSCOPY  02/16/2012   Procedure: COLONOSCOPY;  Surgeon: Lamar CHRISTELLA Hollingshead, MD;  Location: [facility] ENDO SUITE;  Service: Endoscopy;  Laterality: N/A;  1:30   COLONOSCOPY N/A 03/26/2017   Procedure: COLONOSCOPY;  Surgeon: Lamar CHRISTELLA Hollingshead, MD;  Location: [facility] ENDO SUITE;  Service: Endoscopy;  Laterality: N/A;  930   COLONOSCOPY WITH PROPOFOL  N/A 08/07/2022   Procedure: COLONOSCOPY WITH PROPOFOL ;  Surgeon: Hollingshead Lamar CHRISTELLA, MD;  Location: [facility];  Service: Endoscopy;  Laterality: N/A;  2:00PM   IR IMAGING GUIDED PORT INSERTION  12/19/2019   LUNG BIOPSY  04/25/2020   Procedure:  LUNG BIOPSY;  Surgeon: Jude Harden GAILS, MD;  Location: [facility] ENDOSCOPY;  Service: Cardiopulmonary;;   MEDIASTINOSCOPY N/A 05/13/2013   Procedure: MEDIASTINOSCOPY;  Surgeon: Elspeth JAYSON Millers, MD;  Location: [facility] OR;  Service: Thoracic;  Laterality: N/A;   POLYPECTOMY  08/07/2022   Procedure: POLYPECTOMY;  Surgeon: Hollingshead Lamar CHRISTELLA, MD;  Location: [facility];  Service: Endoscopy;;   VAGINAL HYSTERECTOMY     vaginal hyst (ovaries insitu)   VIDEO BRONCHOSCOPY N/A 04/25/2020   Procedure: VIDEO BRONCHOSCOPY WITH FLUORO;  Surgeon: Jude Harden GAILS, MD;  Location: [facility] ENDOSCOPY;  Service: Cardiopulmonary;  Laterality: N/A;   VIDEO BRONCHOSCOPY WITH ENDOBRONCHIAL ULTRASOUND N/A 05/13/2013   Procedure: VIDEO BRONCHOSCOPY WITH ENDOBRONCHIAL ULTRASOUND;  Surgeon: Elspeth JAYSON Millers, MD;  Location: [facility] OR;  Service: Thoracic;  Laterality: N/A;    Social History   Socioeconomic History   Marital status: Divorced    Spouse name: Not on file   Number of children: 3   Years of education: Not on file   Highest education level: Not on file  Occupational History   Occupation: Pensions consultant     Employer: [employer]  Tobacco Use   Smoking status: Former  Current packs/day: 0.00    Types: Cigarettes    Quit date: 04/19/2013    Years since quitting: 11.2   Smokeless tobacco: Never  Vaping Use   Vaping status: Never Used  Substance and Sexual Activity   Alcohol  use: Yes    Comment: occassionally   Drug use: No   Sexual activity: Not Currently    Birth control/protection: Surgical  Other Topics Concern   Not on file  Social History Narrative   Not on file   Social Drivers of Health   Financial Resource Strain: Low Risk  (08/21/2022)   Overall Financial Resource Strain (CARDIA)    Difficulty of Paying Living Expenses: Not hard at all  Food Insecurity: No Food Insecurity (08/21/2022)   Hunger Vital Sign    Worried About Running Out of Food in the Last Year: Never true    Ran Out of Food in the  Last Year: Never true  Transportation Needs: No Transportation Needs (08/21/2022)   PRAPARE - Administrator, Civil Service (Medical): No    Lack of Transportation (Non-Medical): No  Physical Activity: Insufficiently Active (08/21/2022)   Exercise Vital Sign    Days of Exercise per Week: 5 days    Minutes of Exercise per Session: 20 min  Stress: No Stress Concern Present (08/21/2022)   [employer] of Occupational Health - Occupational Stress Questionnaire    Feeling of Stress : Not at all  Social Connections: Moderately Integrated (08/21/2022)   Social Connection and Isolation Panel    Frequency of Communication with Friends and Family: More than three times a week    Frequency of Social Gatherings with Friends and Family: Twice a week    Attends Religious Services: More than 4 times per year    Active Member of [employer] or Organizations: Yes    Attends Banker Meetings: 1 to 4 times per year    Marital Status: Divorced    Family History  Problem Relation Age of Onset   Colon polyps Mother    Hypertension Mother    Heart disease Mother    Hyperlipidemia Mother    Cancer Father        prostate   Hypertension Father    Hyperlipidemia Father    Heart defect Other        family history    Asthma Other        family history    Colon cancer Neg Hx     Outpatient Encounter Medications as of 07/19/2024  Medication Sig   tirzepatide  (MOUNJARO ) 2.5 MG/0.5ML Pen Inject 2.5 mg into the skin once a week.   Accu-Chek Softclix Lancets lancets Use to check glucose 2 x daily as instructed   albuterol  (PROVENTIL  HFA;VENTOLIN  HFA) 108 (90 Base) MCG/ACT inhaler Inhale 2 puffs into the lungs every 4 (four) hours as needed for wheezing or shortness of breath.   amoxicillin -clavulanate (AUGMENTIN ) 875-125 MG tablet Take 1 tablet by mouth every 12 (twelve) hours. (Patient not taking: Reported on 07/19/2024)   Ascorbic Acid (VITAMIN C PO) Take 1 tablet by mouth in the morning.    azithromycin  (ZITHROMAX ) 250 MG tablet Take 1 tablet (250 mg total) by mouth daily. Take first 2 tablets together, then 1 every day until finished. (Patient not taking: Reported on 07/19/2024)   benzonatate  (TESSALON ) 200 MG capsule Take 1 capsule (200 mg total) by mouth 3 (three) times daily as needed. Follow whole, do not chew   Cholecalciferol (VITAMIN D3 PO) Take 1  tablet by mouth in the morning.   empagliflozin  (JARDIANCE ) 25 MG TABS tablet Take 1 tablet (25 mg total) by mouth daily before breakfast.   furosemide  (LASIX ) 20 MG tablet Take 20 mg by mouth in the morning.   gabapentin  (NEURONTIN ) 100 MG capsule Take 100 mg by mouth in the morning.   latanoprost (XALATAN) 0.005 % ophthalmic solution Place 1 drop into both eyes at bedtime.   metFORMIN  (GLUCOPHAGE ) 500 MG tablet Take 2 tablets (1,000 mg total) by mouth daily with breakfast.   metoprolol  tartrate (LOPRESSOR ) 25 MG tablet Take 25 mg by mouth 2 (two) times daily.   montelukast  (SINGULAIR ) 10 MG tablet Take 10 mg by mouth in the morning.   Na Sulfate-K Sulfate-Mg Sulf 17.5-3.13-1.6 GM/177ML SOLN As directed (Patient not taking: Reported on 08/21/2022)   naproxen  sodium (ALEVE ) 220 MG tablet Take 220-440 mg by mouth 2 (two) times daily as needed (back pain.).   potassium chloride  SA (KLOR-CON  M) 20 MEQ tablet Take 20 mEq by mouth in the morning.   rosuvastatin  (CRESTOR ) 10 MG tablet Take 10 mg by mouth in the morning.   TRELEGY ELLIPTA 200-62.5-25 MCG/ACT AEPB Take 1 puff by mouth in the morning.   No facility-administered encounter medications on file as of 07/19/2024.    ALLERGIES: Allergies  Allergen Reactions   Iodine Anaphylaxis    Told to avoid due to shellfish allergy. Told to avoid due to shellfish allergy.   Shellfish-Derived Products Anaphylaxis and Swelling    Eyes swelling, scratchy throat, bumps on lips.    VACCINATION STATUS: Immunization History  Administered Date(s) Administered   DTaP / Hep B / IPV 10/04/2014    HIB (PRP-T) 10/04/2014   Influenza,inj,Quad PF,6+ Mos 11/04/2013, 10/04/2014   Influenza-Unspecified 09/14/2018   Moderna Sars-Covid-2 Vaccination 03/08/2020, 04/10/2020   Pneumococcal Conjugate-13 10/04/2014    Diabetes She presents for her follow-up diabetic visit. She has type 2 diabetes mellitus. Onset time: She was diagnosed at approximate age of 50 years. Her disease course has been stable. There are no hypoglycemic associated symptoms. Pertinent negatives for hypoglycemia include no confusion, headaches, pallor or seizures. Pertinent negatives for diabetes include no chest pain, no fatigue, no polydipsia, no polyphagia and no polyuria. There are no hypoglycemic complications. Symptoms are stable. There are no diabetic complications. Risk factors for coronary artery disease include diabetes mellitus, dyslipidemia, family history, obesity, hypertension, tobacco exposure, sedentary lifestyle and post-menopausal. Current diabetic treatment includes insulin  injections (She is currently on Lantus  60 units nightly, Metformin  1000 mg p.o. twice daily.). Her weight is increasing steadily. She is following a generally unhealthy diet. When asked about meal planning, she reported none. She has not had a previous visit with a dietitian. She rarely participates in exercise. Her home blood glucose trend is increasing steadily. (She presents without her meter.  Her point-of-care A1c is stable at 6.9%.  Patient remains on Jardiance  and metformin .  She wishes to achieve some weight loss.     ) An ACE inhibitor/angiotensin II receptor blocker is being taken. Eye exam is current.  Hyperlipidemia This is a chronic problem. The current episode started more than 1 year ago. Exacerbating diseases include diabetes. Pertinent negatives include no chest pain, myalgias or shortness of breath. Current antihyperlipidemic treatment includes statins. Risk factors for coronary artery disease include dyslipidemia, diabetes  mellitus, hypertension, obesity, a sedentary lifestyle, post-menopausal and family history.  Hypertension This is a chronic problem. The current episode started more than 1 year ago. Pertinent negatives include no chest  pain, headaches, palpitations or shortness of breath. Risk factors for coronary artery disease include diabetes mellitus, dyslipidemia, family history, obesity, post-menopausal state, smoking/tobacco exposure and sedentary lifestyle. Past treatments include angiotensin blockers.     Objective:       07/19/2024    1:10 PM 05/19/2024    3:34 PM 05/19/2024    3:32 PM  Vitals with BMI  Height 5' 8    Weight 262 lbs 13 oz    BMI 39.97    Systolic 114  124  Diastolic 76  69  Pulse 76 97 95    BP 114/76   Pulse 76   Ht 5' 8 (1.727 m)   Wt 262 lb 12.8 oz (119.2 kg)   BMI 39.96 kg/m   Wt Readings from Last 3 Encounters:  07/19/24 262 lb 12.8 oz (119.2 kg)  05/19/24 256 lb 9.9 oz (116.4 kg)  03/17/24 256 lb 9.6 oz (116.4 kg)      CMP ( most recent) CMP     Component Value Date/Time   NA 145 (H) 07/14/2024 0935   NA 144 09/04/2017 0916   K 4.5 07/14/2024 0935   K 4.1 09/04/2017 0916   CL 105 07/14/2024 0935   CO2 22 07/14/2024 0935   CO2 26 09/04/2017 0916   GLUCOSE 85 07/14/2024 0935   GLUCOSE 96 05/19/2024 1359   GLUCOSE 197 (H) 09/04/2017 0916   BUN 16 07/14/2024 0935   BUN 15.6 09/04/2017 0916   CREATININE 0.99 07/14/2024 0935   CREATININE 0.84 04/05/2020 0900   CREATININE 1.0 09/04/2017 0916   CALCIUM  9.4 07/14/2024 0935   CALCIUM  9.5 09/04/2017 0916   PROT 6.8 07/14/2024 0935   PROT 6.8 09/04/2017 0916   ALBUMIN 4.3 07/14/2024 0935   ALBUMIN 3.8 09/04/2017 0916   AST 16 07/14/2024 0935   AST 18 04/05/2020 0900   AST 19 09/04/2017 0916   ALT 16 07/14/2024 0935   ALT 14 04/05/2020 0900   ALT 18 09/04/2017 0916   ALKPHOS 76 07/14/2024 0935   ALKPHOS 49 09/04/2017 0916   BILITOT 0.3 07/14/2024 0935   BILITOT 0.3 04/05/2020 0900   BILITOT 0.49  09/04/2017 0916   GFRNONAA >60 05/19/2024 1359   GFRNONAA >60 04/05/2020 0900   GFRAA 66 11/05/2020 0927   GFRAA >60 04/05/2020 0900    Diabetic Labs (most recent): Lab Results  Component Value Date   HGBA1C 6.9 07/19/2024   HGBA1C 6.1 11/17/2023   HGBA1C 6.9 08/03/2023    Lab Results  Component Value Date   TSH 3.470 03/24/2023   TSH 4.930 (H) 11/19/2022   TSH 3.210 11/22/2021   TSH 3.770 11/05/2020   TSH 2.859 05/06/2013   FREET4 1.11 03/24/2023   FREET4 0.94 11/19/2022   FREET4 0.97 11/22/2021   FREET4 0.79 (L) 11/05/2020   FREET4 0.93 05/06/2013    Lipid Panel     Component Value Date/Time   CHOL 135 07/14/2024 0935   TRIG 118 07/14/2024 0935   HDL 47 07/14/2024 0935   CHOLHDL 2.9 07/14/2024 0935   LDLCALC 67 07/14/2024 0935   LABVLDL 21 07/14/2024 0935    Assessment & Plan:   1. Type 2 diabetes mellitus with hyperglycemia, without long-term current use of insulin  (HCC)  - Shashana H Marner has currently uncontrolled symptomatic type 2 DM since  63 years of age.  She presents without her meter.  Her point-of-care A1c is stable at 6.9%.  Patient remains on Jardiance  and metformin .  She wishes to achieve  some weight loss.     -- Recent labs reviewed.   - I had a long discussion with her about the progressive nature of diabetes and the pathology behind its complications. -her diabetes is complicated by obesity/sedentary life and she remains at a high risk for more acute and chronic complications which include CAD, CVA, CKD, retinopathy, and neuropathy. These are all discussed in detail with her.  - she acknowledges that there is a room for improvement in her food and drink choices.  - she did not engage with lifestyle medicine nutrition optimally, acknowledges that there is a room for improvement in her food and drink choices. - Suggestion is made for her to avoid simple carbohydrates  from her diet including Cakes, Sweet Desserts, Ice Cream, Soda (diet and  regular), Sweet Tea, Candies, Chips, Cookies, Store Bought Juices, Alcohol  , Artificial Sweeteners,  Coffee Creamer, and Sugar-free Products, Lemonade. This will help patient to have more stable blood glucose profile and potentially avoid unintended weight gain.  The following Lifestyle Medicine recommendations according to American College of Lifestyle Medicine  [facility]) were discussed and and offered to patient and she  agrees to start the journey:  A. Whole Foods, Plant-Based Nutrition comprising of fruits and vegetables, plant-based proteins, whole-grain carbohydrates was discussed in detail with the patient.   A list for source of those nutrients were also provided to the patient.  Patient will use only water  or unsweetened tea for hydration. B.  The need to stay away from risky substances including alcohol , smoking; obtaining 7 to 9 hours of restorative sleep, at least 150 minutes of moderate intensity exercise weekly, the importance of healthy social connections,  and stress management techniques were discussed. C.  A full color page of  Calorie density of various food groups per pound showing examples of each food groups was provided to the patient.   -  she is advised to stick to a routine mealtimes to eat 3 meals  a day and avoid unnecessary snacks ( to snack only to correct hypoglycemia).    I have approached her with the following individualized plan to manage  her diabetes and patient agrees:   - From diabetes point of view, she is benefiting from her current regimen.  She is advised to continue Jardiance  25 mg p.o. daily at breakfast as well as metformin  1000 mg p.o. daily at breakfast.    She is advised and willing to monitor blood glucose twice daily-daily before breakfast and at bedtime. -She is encouraged to call clinic for hypoglycemia below 70 or hyperglycemia above 200 mg per DL. - This patient will benefit from GLP-1 receptor agonist.  I discussed and prescribed Mounjaro  2.5 mg  subcutaneously weekly.  Side effects and precautions discussed with her.  This medication will be advanced to the next standard higher doses as she tolerates.  - Specific targets for  A1c;  LDL, HDL,  and Triglycerides were discussed with the patient.  2) Blood Pressure /Hypertension:  Her blood pressure is controlled to target. -  she is advised to continue her current medications including losartan  25 mg p.o. daily with breakfast .  3) Lipids/Hyperlipidemia: Review of her recent lipid panel showed improved LDL at 67.  She is advised to continue Crestor  10 mg p.o. nightly.     Side effects and precautions discussed with her.  4)  Weight/Diet:  Body mass index is 39.96 kg/m.  -   clearly complicating her diabetes care.   she is  a candidate for weight loss. I discussed with her the fact that loss of 5 - 10% of her  current body weight will have the most impact on her diabetes management.  Exercise, and detailed carbohydrates information provided  -  detailed on discharge instructions.  5) Chronic Care/Health Maintenance:  -she  is on ACEI/ARB and Statin medications and  is encouraged to initiate and continue to follow up with Ophthalmology, Dentist,  Podiatrist at least yearly or according to recommendations, and advised to   stay away from smoking. I have recommended yearly flu vaccine and pneumonia vaccine at least every 5 years; moderate intensity exercise for up to 150 minutes weekly; and  sleep for at least 7 hours a day.  - she is  advised to maintain close follow up with [name], [name] Demissie, MD for primary care needs, as well as her other providers for optimal and coordinated care.  I spent  26  minutes in the care of the patient today including review of labs from CMP, Lipids, Thyroid  Function, Hematology (current and previous including abstractions from other facilities); face-to-face time discussing  her blood glucose readings/logs, discussing hypoglycemia and hyperglycemia episodes  and symptoms, medications doses, her options of short and long term treatment based on the latest standards of care / guidelines;  discussion about incorporating lifestyle medicine;  and documenting the encounter. Risk reduction counseling performed per USPSTF guidelines to reduce  obesity and cardiovascular risk factors.     Please refer to Patient Instructions for Blood Glucose Monitoring and Insulin /Medications Dosing Guide  in media tab for additional information. Please  also refer to  Patient Self Inventory in the Media  tab for reviewed elements of pertinent patient history.  Gabriella VEAR Kraft participated in the discussions, expressed understanding, and voiced agreement with the above plans.  All questions were answered to her satisfaction. she is encouraged to contact clinic should she have any questions or concerns prior to her return visit.  Follow up plan: - Return in about 3 months (around 10/19/2024) for Bring Meter/CGM Device/Logs- A1c in Office.  Ranny Earl, MD [facility] [address] [place], [place] [address] Phone: [phone]  Fax: [phone]    07/19/2024, 5:14 PM  This note was partially dictated with voice recognition software. Similar sounding words can be transcribed inadequately or may not  be corrected upon review.

## 2024-07-19 NOTE — Patient Instructions (Signed)

## 2024-07-20 ENCOUNTER — Other Ambulatory Visit: Payer: Self-pay

## 2024-07-21 ENCOUNTER — Ambulatory Visit: Admitting: "Endocrinology

## 2024-09-20 ENCOUNTER — Encounter: Payer: Self-pay | Admitting: Oncology

## 2024-09-20 ENCOUNTER — Other Ambulatory Visit (HOSPITAL_COMMUNITY): Payer: Self-pay | Admitting: Internal Medicine

## 2024-09-20 DIAGNOSIS — Z1231 Encounter for screening mammogram for malignant neoplasm of breast: Secondary | ICD-10-CM

## 2024-09-28 ENCOUNTER — Encounter (HOSPITAL_COMMUNITY): Payer: Self-pay

## 2024-09-28 ENCOUNTER — Ambulatory Visit (HOSPITAL_COMMUNITY)
Admission: RE | Admit: 2024-09-28 | Discharge: 2024-09-28 | Disposition: A | Discharge status: Home / Self Care | Source: Ambulatory Visit | Attending: Internal Medicine | Admitting: Internal Medicine

## 2024-09-28 DIAGNOSIS — Z1231 Encounter for screening mammogram for malignant neoplasm of breast: Secondary | ICD-10-CM | POA: Insufficient documentation

## 2024-09-29 ENCOUNTER — Other Ambulatory Visit: Payer: Self-pay

## 2024-10-13 ENCOUNTER — Other Ambulatory Visit: Payer: Self-pay | Admitting: "Endocrinology

## 2024-10-25 ENCOUNTER — Ambulatory Visit (INDEPENDENT_AMBULATORY_CARE_PROVIDER_SITE_OTHER): Admitting: "Endocrinology

## 2024-10-25 ENCOUNTER — Encounter: Payer: Self-pay | Admitting: "Endocrinology

## 2024-10-25 VITALS — BP 104/74 | HR 72 | Ht 68.0 in | Wt 262.2 lb

## 2024-10-25 DIAGNOSIS — I10 Essential (primary) hypertension: Secondary | ICD-10-CM

## 2024-10-25 DIAGNOSIS — E1165 Type 2 diabetes mellitus with hyperglycemia: Secondary | ICD-10-CM | POA: Diagnosis not present

## 2024-10-25 DIAGNOSIS — E782 Mixed hyperlipidemia: Secondary | ICD-10-CM

## 2024-10-25 DIAGNOSIS — Z6839 Body mass index (BMI) 39.0-39.9, adult: Secondary | ICD-10-CM

## 2024-10-25 DIAGNOSIS — E66812 Obesity, class 2: Secondary | ICD-10-CM | POA: Diagnosis not present

## 2024-10-25 DIAGNOSIS — Z7984 Long term (current) use of oral hypoglycemic drugs: Secondary | ICD-10-CM

## 2024-10-25 LAB — POCT GLYCOSYLATED HEMOGLOBIN (HGB A1C): HbA1c, POC (controlled diabetic range): 6.9 % (ref 0.0–7.0)

## 2024-10-25 MED ORDER — MOUNJARO 2.5 MG/0.5ML ~~LOC~~ SOAJ: 2.5000 mg | SUBCUTANEOUS | 0 refills | Status: AC

## 2024-10-25 NOTE — Progress Notes (Signed)
 10/25/2024, 3:06 PM  Endocrinology follow-up note   Subjective:    Patient ID: Megan Mcknight, female    DOB: 12-10-1961.  Megan Mcknight is being seen in follow-up after she was seen in follow-up after she was seen in  consultation for management of currently uncontrolled symptomatic diabetes requested by  Carlette Benita Area, MD.   Past Medical History:  Diagnosis Date   Acute bronchitis    Anaplastic large cell lymphoma 2014   remission since 2015   Asthma    Cancer (HCC)    lung and kidney   Colon polyps 02/2013   Per colonoscopy   Coma (HCC)    Diabetes mellitus without complication (HCC)    Diverticulosis 02/2013   Per colonoscopy   Dysrhythmia    Former light tobacco smoker    GERD (gastroesophageal reflux disease)    H/O stem cell transplant (HCC) 2015   Hyperlipidemia    Nerve pain    Left leg   Non Hodgkin's lymphoma (HCC)    Seasonal allergies     Past Surgical History:  Procedure Laterality Date   bones removed from Baby toes     BRONCHIAL BRUSHINGS  04/25/2020   Procedure: BRONCHIAL BRUSHINGS;  Surgeon: Jude Harden GAILS, MD;  Location: [facility] ENDOSCOPY;  Service: Cardiopulmonary;;   BRONCHIAL WASHINGS  04/25/2020   Procedure: BRONCHIAL WASHINGS;  Surgeon: Jude Harden GAILS, MD;  Location: [facility] ENDOSCOPY;  Service: Cardiopulmonary;;   COLONOSCOPY  02/16/2012   Procedure: COLONOSCOPY;  Surgeon: Lamar CHRISTELLA Hollingshead, MD;  Location: [facility] ENDO SUITE;  Service: Endoscopy;  Laterality: N/A;  1:30   COLONOSCOPY N/A 03/26/2017   Procedure: COLONOSCOPY;  Surgeon: Lamar CHRISTELLA Hollingshead, MD;  Location: [facility] ENDO SUITE;  Service: Endoscopy;  Laterality: N/A;  930   COLONOSCOPY WITH PROPOFOL  N/A 08/07/2022   Procedure: COLONOSCOPY WITH PROPOFOL ;  Surgeon: Hollingshead Lamar CHRISTELLA, MD;  Location: [facility] ENDO SUITE;  Service: Endoscopy;  Laterality: N/A;  2:00PM   IR IMAGING GUIDED PORT INSERTION  12/19/2019   LUNG BIOPSY  04/25/2020    Procedure: LUNG BIOPSY;  Surgeon: Jude Harden GAILS, MD;  Location: [facility] ENDOSCOPY;  Service: Cardiopulmonary;;   MEDIASTINOSCOPY N/A 05/13/2013   Procedure: MEDIASTINOSCOPY;  Surgeon: Elspeth JAYSON Millers, MD;  Location: [facility] OR;  Service: Thoracic;  Laterality: N/A;   POLYPECTOMY  08/07/2022   Procedure: POLYPECTOMY;  Surgeon: Hollingshead Lamar CHRISTELLA, MD;  Location: [facility] ENDO SUITE;  Service: Endoscopy;;   VAGINAL HYSTERECTOMY     vaginal hyst (ovaries insitu)   VIDEO BRONCHOSCOPY N/A 04/25/2020   Procedure: VIDEO BRONCHOSCOPY WITH FLUORO;  Surgeon: Jude Harden GAILS, MD;  Location: [facility] ENDOSCOPY;  Service: Cardiopulmonary;  Laterality: N/A;   VIDEO BRONCHOSCOPY WITH ENDOBRONCHIAL ULTRASOUND N/A 05/13/2013   Procedure: VIDEO BRONCHOSCOPY WITH ENDOBRONCHIAL ULTRASOUND;  Surgeon: Elspeth JAYSON Millers, MD;  Location: [facility] OR;  Service: Thoracic;  Laterality: N/A;    Social History   Socioeconomic History   Marital status: Divorced    Spouse name: Not on file   Number of children: 3   Years of education: Not on file   Highest education level: Not on file  Occupational History   Occupation: pensions consultant     Employer: [employer]  Tobacco Use   Smoking status: Former  Current packs/day: 0.00    Types: Cigarettes    Quit date: 04/19/2013    Years since quitting: 11.5   Smokeless tobacco: Never  Vaping Use   Vaping status: Never Used  Substance and Sexual Activity   Alcohol  use: Yes    Comment: occassionally   Drug use: No   Sexual activity: Not Currently    Birth control/protection: Surgical  Other Topics Concern   Not on file  Social History Narrative   Not on file   Social Drivers of Health   Financial Resource Strain: Low Risk  (08/21/2022)   Overall Financial Resource Strain (CARDIA)    Difficulty of Paying Living Expenses: Not hard at all  Food Insecurity: No Food Insecurity (08/21/2022)   Hunger Vital Sign    Worried About Running Out of Food in the Last Year: Never true    Ran Out of  Food in the Last Year: Never true  Transportation Needs: No Transportation Needs (08/21/2022)   PRAPARE - Administrator, Civil Service (Medical): No    Lack of Transportation (Non-Medical): No  Physical Activity: Insufficiently Active (08/21/2022)   Exercise Vital Sign    Days of Exercise per Week: 5 days    Minutes of Exercise per Session: 20 min  Stress: No Stress Concern Present (08/21/2022)   [employer] of Occupational Health - Occupational Stress Questionnaire    Feeling of Stress : Not at all  Social Connections: Moderately Integrated (08/21/2022)   Social Connection and Isolation Panel    Frequency of Communication with Friends and Family: More than three times a week    Frequency of Social Gatherings with Friends and Family: Twice a week    Attends Religious Services: More than 4 times per year    Active Member of [employer] or Organizations: Yes    Attends Banker Meetings: 1 to 4 times per year    Marital Status: Divorced    Family History  Problem Relation Age of Onset   Colon polyps Mother    Hypertension Mother    Heart disease Mother    Hyperlipidemia Mother    Cancer Father        prostate   Hypertension Father    Hyperlipidemia Father    Heart defect Other        family history    Asthma Other        family history    Colon cancer Neg Hx     Outpatient Encounter Medications as of 10/25/2024  Medication Sig   Accu-Chek Softclix Lancets lancets Use to check glucose 2 x daily as instructed   albuterol  (PROVENTIL  HFA;VENTOLIN  HFA) 108 (90 Base) MCG/ACT inhaler Inhale 2 puffs into the lungs every 4 (four) hours as needed for wheezing or shortness of breath.   amoxicillin -clavulanate (AUGMENTIN ) 875-125 MG tablet Take 1 tablet by mouth every 12 (twelve) hours. (Patient not taking: Reported on 07/19/2024)   Ascorbic Acid (VITAMIN C PO) Take 1 tablet by mouth in the morning.   azithromycin  (ZITHROMAX ) 250 MG tablet Take 1 tablet (250 mg total) by  mouth daily. Take first 2 tablets together, then 1 every day until finished. (Patient not taking: Reported on 07/19/2024)   benzonatate  (TESSALON ) 200 MG capsule Take 1 capsule (200 mg total) by mouth 3 (three) times daily as needed. Follow whole, do not chew   Cholecalciferol (VITAMIN D3 PO) Take 1 tablet by mouth in the morning.   empagliflozin  (JARDIANCE ) 25 MG TABS tablet Take 1  tablet (25 mg total) by mouth daily before breakfast.   furosemide  (LASIX ) 20 MG tablet Take 20 mg by mouth in the morning.   gabapentin  (NEURONTIN ) 100 MG capsule Take 100 mg by mouth in the morning.   latanoprost (XALATAN) 0.005 % ophthalmic solution Place 1 drop into both eyes at bedtime.   metFORMIN  (GLUCOPHAGE ) 500 MG tablet Take 2 tablets (1,000 mg total) by mouth daily with breakfast.   metoprolol  tartrate (LOPRESSOR ) 25 MG tablet Take 25 mg by mouth 2 (two) times daily.   montelukast  (SINGULAIR ) 10 MG tablet Take 10 mg by mouth in the morning.   Na Sulfate-K Sulfate-Mg Sulf 17.5-3.13-1.6 GM/177ML SOLN As directed (Patient not taking: Reported on 08/21/2022)   naproxen  sodium (ALEVE ) 220 MG tablet Take 220-440 mg by mouth 2 (two) times daily as needed (back pain.).   potassium chloride  SA (KLOR-CON  M) 20 MEQ tablet Take 20 mEq by mouth in the morning.   rosuvastatin  (CRESTOR ) 10 MG tablet Take 10 mg by mouth in the morning.   tirzepatide  (MOUNJARO ) 2.5 MG/0.5ML Pen Inject 2.5 mg into the skin once a week.   TRELEGY ELLIPTA 200-62.5-25 MCG/ACT AEPB Take 1 puff by mouth in the morning.   [DISCONTINUED] MOUNJARO  2.5 MG/0.5ML Pen ADMINISTER 2.5 MG UNDER THE SKIN 1 TIME A WEEK   No facility-administered encounter medications on file as of 10/25/2024.    ALLERGIES: Allergies  Allergen Reactions   Iodine Anaphylaxis    Told to avoid due to shellfish allergy. Told to avoid due to shellfish allergy.   Shellfish Protein-Containing Drug Products Anaphylaxis and Swelling    Eyes swelling, scratchy throat, bumps on  lips.    VACCINATION STATUS: Immunization History  Administered Date(s) Administered   DTaP / Hep B / IPV 10/04/2014   HIB (PRP-T) 10/04/2014   Influenza,inj,Quad PF,6+ Mos 11/04/2013, 10/04/2014   Influenza-Unspecified 09/14/2018   Moderna Sars-Covid-2 Vaccination 03/08/2020, 04/10/2020   Pneumococcal Conjugate-13 10/04/2014    Diabetes She presents for her follow-up diabetic visit. She has type 2 diabetes mellitus. Onset time: She was diagnosed at approximate age of 50 years. Her disease course has been stable. There are no hypoglycemic associated symptoms. Pertinent negatives for hypoglycemia include no confusion, headaches, pallor or seizures. Pertinent negatives for diabetes include no chest pain, no fatigue, no polydipsia, no polyphagia and no polyuria. There are no hypoglycemic complications. Symptoms are stable. There are no diabetic complications. Risk factors for coronary artery disease include diabetes mellitus, dyslipidemia, family history, obesity, hypertension, tobacco exposure, sedentary lifestyle and post-menopausal. Current diabetic treatment includes insulin  injections (She is currently on Lantus  60 units nightly, Metformin  1000 mg p.o. twice daily.). Her weight is fluctuating minimally. She is following a generally unhealthy diet. When asked about meal planning, she reported none. She has not had a previous visit with a dietitian. She rarely participates in exercise. Her home blood glucose trend is fluctuating minimally. (She denies hypoglycemia.  Her point-of-care A1c remains stable at 6.9%.  She is currently on Jardiance , Mounjaro  and metformin .  She presents without her meter. ) An ACE inhibitor/angiotensin II receptor blocker is being taken. Eye exam is current.  Hyperlipidemia This is a chronic problem. The current episode started more than 1 year ago. Exacerbating diseases include diabetes. Pertinent negatives include no chest pain, myalgias or shortness of breath. Current  antihyperlipidemic treatment includes statins. Risk factors for coronary artery disease include dyslipidemia, diabetes mellitus, hypertension, obesity, a sedentary lifestyle, post-menopausal and family history.  Hypertension This is a chronic problem. The  current episode started more than 1 year ago. Pertinent negatives include no chest pain, headaches, palpitations or shortness of breath. Risk factors for coronary artery disease include diabetes mellitus, dyslipidemia, family history, obesity, post-menopausal state, smoking/tobacco exposure and sedentary lifestyle. Past treatments include angiotensin blockers.     Objective:       10/25/2024    1:03 PM 07/19/2024    1:10 PM 05/19/2024    3:34 PM  Vitals with BMI  Height 5' 8 5' 8   Weight 262 lbs 3 oz 262 lbs 13 oz   BMI 39.88 39.97   Systolic 104 114   Diastolic 74 76   Pulse 72 76 97    BP 104/74   Pulse 72   Ht 5' 8 (1.727 m)   Wt 262 lb 3.2 oz (118.9 kg)   BMI 39.87 kg/m   Wt Readings from Last 3 Encounters:  10/25/24 262 lb 3.2 oz (118.9 kg)  07/19/24 262 lb 12.8 oz (119.2 kg)  05/19/24 256 lb 9.9 oz (116.4 kg)      CMP ( most recent) CMP     Component Value Date/Time   NA 145 (H) 07/14/2024 0935   NA 144 09/04/2017 0916   K 4.5 07/14/2024 0935   K 4.1 09/04/2017 0916   CL 105 07/14/2024 0935   CO2 22 07/14/2024 0935   CO2 26 09/04/2017 0916   GLUCOSE 85 07/14/2024 0935   GLUCOSE 96 05/19/2024 1359   GLUCOSE 197 (H) 09/04/2017 0916   BUN 16 07/14/2024 0935   BUN 15.6 09/04/2017 0916   CREATININE 0.99 07/14/2024 0935   CREATININE 0.84 04/05/2020 0900   CREATININE 1.0 09/04/2017 0916   CALCIUM  9.4 07/14/2024 0935   CALCIUM  9.5 09/04/2017 0916   PROT 6.8 07/14/2024 0935   PROT 6.8 09/04/2017 0916   ALBUMIN 4.3 07/14/2024 0935   ALBUMIN 3.8 09/04/2017 0916   AST 16 07/14/2024 0935   AST 18 04/05/2020 0900   AST 19 09/04/2017 0916   ALT 16 07/14/2024 0935   ALT 14 04/05/2020 0900   ALT 18 09/04/2017  0916   ALKPHOS 76 07/14/2024 0935   ALKPHOS 49 09/04/2017 0916   BILITOT 0.3 07/14/2024 0935   BILITOT 0.3 04/05/2020 0900   BILITOT 0.49 09/04/2017 0916   GFRNONAA >60 05/19/2024 1359   GFRNONAA >60 04/05/2020 0900   GFRAA 66 11/05/2020 0927   GFRAA >60 04/05/2020 0900    Diabetic Labs (most recent): Lab Results  Component Value Date   HGBA1C 6.9 10/25/2024   HGBA1C 6.9 07/19/2024   HGBA1C 6.1 11/17/2023    Lab Results  Component Value Date   TSH 3.470 03/24/2023   TSH 4.930 (H) 11/19/2022   TSH 3.210 11/22/2021   TSH 3.770 11/05/2020   TSH 2.859 05/06/2013   FREET4 1.11 03/24/2023   FREET4 0.94 11/19/2022   FREET4 0.97 11/22/2021   FREET4 0.79 (L) 11/05/2020   FREET4 0.93 05/06/2013    Lipid Panel     Component Value Date/Time   CHOL 135 07/14/2024 0935   TRIG 118 07/14/2024 0935   HDL 47 07/14/2024 0935   CHOLHDL 2.9 07/14/2024 0935   LDLCALC 67 07/14/2024 0935   LABVLDL 21 07/14/2024 0935    Assessment & Plan:   1. Type 2 diabetes mellitus with hyperglycemia, without long-term current use of insulin  (HCC)  - Lizania H Koop has currently uncontrolled symptomatic type 2 DM since  63 years of age.  She denies hypoglycemia.  Her point-of-care A1c remains  stable at 6.9%.  She is currently on Jardiance , Mounjaro  and metformin .  She presents without her meter.   -- Recent labs reviewed.   - I had a long discussion with her about the progressive nature of diabetes and the pathology behind its complications. -her diabetes is complicated by obesity/sedentary life and she remains at a high risk for more acute and chronic complications which include CAD, CVA, CKD, retinopathy, and neuropathy. These are all discussed in detail with her.  - she acknowledges that there is a room for improvement in her food and drink choices.  - she did not engage with lifestyle medicine nutrition optimally, acknowledges that there is a room for improvement in her food and drink  choices.  - she acknowledges that there is a room for improvement in her food and drink choices. - Suggestion is made for her to avoid simple carbohydrates  from her diet including Cakes, Sweet Desserts, Ice Cream, Soda (diet and regular), Sweet Tea, Candies, Chips, Cookies, Store Bought Juices, Alcohol  , Artificial Sweeteners,  Coffee Creamer, and Sugar-free Products, Lemonade. This will help patient to have more stable blood glucose profile and potentially avoid unintended weight gain.  The following Lifestyle Medicine recommendations according to American College of Lifestyle Medicine  [facility]) were discussed and and offered to patient and she  agrees to start the journey:  A. Whole Foods, Plant-Based Nutrition comprising of fruits and vegetables, plant-based proteins, whole-grain carbohydrates was discussed in detail with the patient.   A list for source of those nutrients were also provided to the patient.  Patient will use only water  or unsweetened tea for hydration. B.  The need to stay away from risky substances including alcohol , smoking; obtaining 7 to 9 hours of restorative sleep, at least 150 minutes of moderate intensity exercise weekly, the importance of healthy social connections,  and stress management techniques were discussed. C.  A full color page of  Calorie density of various food groups per pound showing examples of each food groups was provided to the patient.   -  she is advised to stick to a routine mealtimes to eat 3 meals  a day and avoid unnecessary snacks ( to snack only to correct hypoglycemia).    I have approached her with the following individualized plan to manage  her diabetes and patient agrees:   - From diabetes point of view, she is benefiting from her current regimen.  She is advised to continue Jardiance  25 mg p.o. daily at breakfast, metformin  1000 mg p.o. daily at breakfast.    She will also benefit from Mounjaro , 2.5 mg subcutaneously weekly.  This  medication will be advanced as she tolerates.    Side effects and precautions discussed with her.   - Specific targets for  A1c;  LDL, HDL,  and Triglycerides were discussed with the patient.  2) Blood Pressure /Hypertension:  Her blood pressure is controlled to target. -  she is advised to continue her current medications including losartan  25 mg p.o. daily with breakfast .  3) Lipids/Hyperlipidemia: Review of her recent lipid panel showed improved LDL at 67.  She is advised to continue Crestor  10 mg p.o. nightly.       Side effects and precautions discussed with her.  4)  Weight/Diet:  Body mass index is 39.87 kg/m.  -   clearly complicating her diabetes care.   she is  a candidate for weight loss. I discussed with her the fact that loss of 5 -  10% of her  current body weight will have the most impact on her diabetes management.  Exercise, and detailed carbohydrates information provided  -  detailed on discharge instructions.  5) Chronic Care/Health Maintenance:  -she  is on ACEI/ARB and Statin medications and  is encouraged to initiate and continue to follow up with Ophthalmology, Dentist,  Podiatrist at least yearly or according to recommendations, and advised to   stay away from smoking. I have recommended yearly flu vaccine and pneumonia vaccine at least every 5 years; moderate intensity exercise for up to 150 minutes weekly; and  sleep for at least 7 hours a day.  - she is  advised to maintain close follow up with [name], [name] Demissie, MD for primary care needs, as well as her other providers for optimal and coordinated care.   I spent  26  minutes in the care of the patient today including review of labs from CMP, Lipids, Thyroid  Function, Hematology (current and previous including abstractions from other facilities); face-to-face time discussing  her blood glucose readings/logs, discussing hypoglycemia and hyperglycemia episodes and symptoms, medications doses, her options of short  and long term treatment based on the latest standards of care / guidelines;  discussion about incorporating lifestyle medicine;  and documenting the encounter. Risk reduction counseling performed per USPSTF guidelines to reduce  obesity and cardiovascular risk factors.     Please refer to Patient Instructions for Blood Glucose Monitoring and Insulin /Medications Dosing Guide  in media tab for additional information. Please  also refer to  Patient Self Inventory in the Media  tab for reviewed elements of pertinent patient history.  Gabriella VEAR Kraft participated in the discussions, expressed understanding, and voiced agreement with the above plans.  All questions were answered to her satisfaction. she is encouraged to contact clinic should she have any questions or concerns prior to her return visit.   Follow up plan: - Return in about 4 months (around 02/22/2025) for F/U with Pre-visit Labs, Meter/CGM/Logs, A1c here.  Ranny Earl, MD [facility] [address] [place], [place] [address] Phone: [phone]  Fax: [phone]    10/25/2024, 3:06 PM  This note was partially dictated with voice recognition software. Similar sounding words can be transcribed inadequately or may not  be corrected upon review.

## 2024-10-25 NOTE — Patient Instructions (Signed)

## 2024-10-26 ENCOUNTER — Other Ambulatory Visit: Payer: Self-pay

## 2024-11-14 ENCOUNTER — Ambulatory Visit: Admitting: Obstetrics & Gynecology

## 2024-11-14 ENCOUNTER — Encounter: Payer: Self-pay | Admitting: Obstetrics & Gynecology

## 2024-11-14 VITALS — BP 133/86 | HR 74 | Ht 68.0 in | Wt 261.4 lb

## 2024-11-14 DIAGNOSIS — R3915 Urgency of urination: Secondary | ICD-10-CM

## 2024-11-14 DIAGNOSIS — Z90710 Acquired absence of both cervix and uterus: Secondary | ICD-10-CM

## 2024-11-14 DIAGNOSIS — R102 Pelvic and perineal pain unspecified side: Secondary | ICD-10-CM

## 2024-11-14 NOTE — Progress Notes (Signed)
 WELL-WOMAN EXAMINATION Patient name: Megan Mcknight MRN [id number]  Date of birth: Jun 03, 1961 Chief Complaint:   Gynecologic Exam  History of Present Illness:   Megan Mcknight is a 63 y.o. G3P0 PM, PH female being seen today for a routine well-woman exam and the following concerns:  Pelvic pain: Pt advised to make f/u in our offie. Notes heaviness- every now and then that has been going on for the past several month.  +bloating.  Denies sharp pain.  Not sure what makes it better or worse.    On occasion may have urge incontinence, she does note urinary urgency.  Denies nocturia.  Denies feeling a pelvic bulge.  Denies vaginal discharge, itching or irritation.  Reports no other acute GYN concerns  No LMP recorded. Patient has had a hysterectomy.  The current method of family planning is post menopausal status/post hysterectomy   Last pap NA.  Last mammogram: 07/2023. Last colonoscopy: up to date     08/21/2022    9:16 AM 03/05/2020    9:06 AM 08/03/2014    1:24 PM 05/31/2014    1:36 PM  Depression screen PHQ 2/9  Decreased Interest 0 0 0 0  Down, Depressed, Hopeless 0 0 0 0  PHQ - 2 Score 0 0 0 0  Altered sleeping 0     Tired, decreased energy 0     Change in appetite 0     Feeling bad or failure about yourself  0     Trouble concentrating 0     Moving slowly or fidgety/restless 0     Suicidal thoughts 0     PHQ-9 Score 0         Data saved with a previous flowsheet row definition      Review of Systems:   Pertinent items are noted in HPI Denies any headaches, blurred vision, fatigue, shortness of breath, chest pain, abdominal pain, bowel movements, urination, or intercourse unless otherwise stated above.  Pertinent History Reviewed:  Reviewed past medical,surgical, social and family history.  Reviewed problem list, medications and allergies. Physical Assessment:   Vitals:   11/14/24 1455  BP: 133/86  Pulse: 74  Weight: 261 lb 6.4 oz (118.6 kg)  Height: 5' 8  (1.727 m)  Body mass index is 39.75 kg/m.        Physical Examination:   General appearance - well appearing, and in no distress  Mental status - alert, oriented to person, place, and time  Psych:  She has a normal mood and affect  Skin - warm and dry, normal color, no suspicious lesions noted  Chest - effort normal, all lung fields clear to auscultation bilaterally  Heart - normal rate and regular rhythm  Neck:  midline trachea, no thyromegaly or nodules  Breasts - breasts appear normal, no suspicious masses, no skin or nipple changes or  axillary nodes  Abdomen - obese, soft, nontender-no reproducible pain nondistended, no masses or organomegaly  Pelvic - VULVA: normal appearing vulva with no masses, tenderness or lesions  VAGINA: normal appearing vagina with normal color and discharge, no lesions.  Uterus and cervix surgically absent.  No evidence of prolapse.  No abnormalities noted on exam ADNEXA: No adnexal masses or tenderness noted.  Extremities:  No swelling or varicosities noted  Chaperone: Alan Fischer     Assessment & Plan:  1) GYN exam - Pap not indicated - Preventive screening up-to-date  2) Pelvic pain/heaviness - Plan for pelvic ultrasound to rule out underlying  ovarian etiology - Reassured patient that there was no evidence of pelvic organ prolapse  3) Urinary urgency - Reviewed conservative management options - Briefly discussed Myrbetriq versus anticholinergics as a potential option - Patient to follow-up if medical management desired  Orders Placed This Encounter  Procedures   US  PELVIC COMPLETE WITH TRANSVAGINAL    Meds: No orders of the defined types were placed in this encounter.   Follow-up: Return for pelvic US - next available, annual in 2-41yr (prn).   Lyris Hitchman, DO Attending Obstetrician & Gynecologist, [facility] for [employer], [facility]

## 2024-12-27 ENCOUNTER — Ambulatory Visit

## 2024-12-27 DIAGNOSIS — R102 Pelvic and perineal pain unspecified side: Secondary | ICD-10-CM

## 2024-12-27 NOTE — Progress Notes (Signed)
 PELVIC US  TA/TV: normal vaginal cuff,normal ovaries (limited view),no free fluid,no pain during ultrasound

## 2025-01-07 ENCOUNTER — Ambulatory Visit: Payer: Self-pay | Admitting: Obstetrics & Gynecology

## 2025-01-13 ENCOUNTER — Telehealth: Payer: Self-pay

## 2025-01-13 NOTE — Telephone Encounter (Signed)
 Pt states she has been injecting Mounjaro  2.5mg  weekly for the past 2 months. States after starting injections she developed a rash around her ankle that itches. States her last injection of Mounjaro  was Sunday 01/08/25. She has an appt with her Dermatologist next week for evaluation. States she has not started any other new medication.

## 2025-01-13 NOTE — Telephone Encounter (Signed)
 Spoke with pt making her aware that a rash can be a rare but mild side effect of Mounjaro  per Dr. Lenis. Advised her to hold her Mounjaro  until she sees her dermatologist and if he comes up with an alternative cause she can resume her Mounjaro  per Dr. Lenis. Pt voiced understanding.

## 2025-03-02 ENCOUNTER — Ambulatory Visit: Admitting: "Endocrinology
